# Patient Record
Sex: Female | Born: 1965
Health system: Southern US, Community
[De-identification: ages and names within clinical notes are randomized; demographics above are authoritative.]

## PROBLEM LIST (undated history)

## (undated) DIAGNOSIS — M519 Unspecified thoracic, thoracolumbar and lumbosacral intervertebral disc disorder: Secondary | ICD-10-CM

## (undated) DIAGNOSIS — I10 Essential (primary) hypertension: Secondary | ICD-10-CM

## (undated) DIAGNOSIS — E785 Hyperlipidemia, unspecified: Secondary | ICD-10-CM

## (undated) DIAGNOSIS — E669 Obesity, unspecified: Secondary | ICD-10-CM

## (undated) HISTORY — PX: REDUCTION MAMMAPLASTY: SUR839

## (undated) HISTORY — DX: Hyperlipidemia, unspecified: E78.5

## (undated) HISTORY — PX: CHOLECYSTECTOMY: SHX55

## (undated) HISTORY — DX: Obesity, unspecified: E66.9

## (undated) HISTORY — DX: Unspecified thoracic, thoracolumbar and lumbosacral intervertebral disc disorder: M51.9

## (undated) HISTORY — DX: Essential (primary) hypertension: I10

## (undated) HISTORY — PX: OTHER SURGICAL HISTORY: SHX169

---

## 1998-04-27 ENCOUNTER — Ambulatory Visit (HOSPITAL_COMMUNITY): Admission: RE | Admit: 1998-04-27 | Discharge: 1998-04-27 | Payer: Self-pay

## 1998-12-07 ENCOUNTER — Ambulatory Visit (HOSPITAL_COMMUNITY): Admission: RE | Admit: 1998-12-07 | Discharge: 1998-12-07 | Payer: Self-pay

## 2000-10-06 ENCOUNTER — Encounter: Payer: Self-pay | Admitting: Emergency Medicine

## 2000-10-06 ENCOUNTER — Emergency Department (HOSPITAL_COMMUNITY): Admission: EM | Admit: 2000-10-06 | Discharge: 2000-10-06 | Payer: Self-pay | Admitting: Emergency Medicine

## 2001-02-27 ENCOUNTER — Ambulatory Visit (HOSPITAL_COMMUNITY): Admission: RE | Admit: 2001-02-27 | Discharge: 2001-02-27 | Payer: Self-pay | Admitting: Family Medicine

## 2001-03-01 ENCOUNTER — Encounter: Admission: RE | Admit: 2001-03-01 | Discharge: 2001-03-01 | Payer: Self-pay | Admitting: Family Medicine

## 2001-03-01 ENCOUNTER — Encounter: Payer: Self-pay | Admitting: Family Medicine

## 2001-06-09 ENCOUNTER — Encounter: Payer: Self-pay | Admitting: Family Medicine

## 2001-06-09 ENCOUNTER — Encounter: Admission: RE | Admit: 2001-06-09 | Discharge: 2001-06-09 | Payer: Self-pay | Admitting: Family Medicine

## 2001-06-23 ENCOUNTER — Encounter: Admission: RE | Admit: 2001-06-23 | Discharge: 2001-06-23 | Payer: Self-pay | Admitting: Family Medicine

## 2001-06-23 ENCOUNTER — Encounter: Payer: Self-pay | Admitting: Family Medicine

## 2001-07-07 ENCOUNTER — Encounter: Payer: Self-pay | Admitting: Family Medicine

## 2001-07-07 ENCOUNTER — Encounter: Admission: RE | Admit: 2001-07-07 | Discharge: 2001-07-07 | Payer: Self-pay | Admitting: Family Medicine

## 2005-10-15 ENCOUNTER — Encounter: Admission: RE | Admit: 2005-10-15 | Discharge: 2005-10-15 | Payer: Self-pay | Admitting: Occupational Medicine

## 2008-01-22 ENCOUNTER — Emergency Department (HOSPITAL_COMMUNITY): Admission: EM | Admit: 2008-01-22 | Discharge: 2008-01-22 | Payer: Self-pay | Admitting: Family Medicine

## 2008-06-27 ENCOUNTER — Emergency Department (HOSPITAL_COMMUNITY): Admission: EM | Admit: 2008-06-27 | Discharge: 2008-06-27 | Payer: Self-pay | Admitting: Emergency Medicine

## 2008-06-30 ENCOUNTER — Emergency Department (HOSPITAL_COMMUNITY): Admission: EM | Admit: 2008-06-30 | Discharge: 2008-06-30 | Payer: Self-pay | Admitting: Emergency Medicine

## 2010-08-15 ENCOUNTER — Emergency Department (HOSPITAL_COMMUNITY)
Admission: EM | Admit: 2010-08-15 | Discharge: 2010-08-15 | Payer: Self-pay | Source: Home / Self Care | Admitting: Emergency Medicine

## 2010-08-17 ENCOUNTER — Emergency Department (HOSPITAL_COMMUNITY)
Admission: EM | Admit: 2010-08-17 | Discharge: 2010-08-17 | Payer: Self-pay | Source: Home / Self Care | Admitting: Family Medicine

## 2010-08-22 ENCOUNTER — Other Ambulatory Visit: Payer: Self-pay | Admitting: Internal Medicine

## 2010-08-22 ENCOUNTER — Ambulatory Visit
Admission: RE | Admit: 2010-08-22 | Discharge: 2010-08-22 | Payer: Self-pay | Source: Home / Self Care | Attending: Internal Medicine | Admitting: Internal Medicine

## 2010-08-22 DIAGNOSIS — E1149 Type 2 diabetes mellitus with other diabetic neurological complication: Secondary | ICD-10-CM | POA: Insufficient documentation

## 2010-08-22 DIAGNOSIS — M5137 Other intervertebral disc degeneration, lumbosacral region: Secondary | ICD-10-CM | POA: Insufficient documentation

## 2010-08-22 DIAGNOSIS — F329 Major depressive disorder, single episode, unspecified: Secondary | ICD-10-CM | POA: Insufficient documentation

## 2010-08-22 DIAGNOSIS — E118 Type 2 diabetes mellitus with unspecified complications: Secondary | ICD-10-CM | POA: Insufficient documentation

## 2010-08-22 DIAGNOSIS — E1165 Type 2 diabetes mellitus with hyperglycemia: Secondary | ICD-10-CM

## 2010-08-22 DIAGNOSIS — IMO0002 Reserved for concepts with insufficient information to code with codable children: Secondary | ICD-10-CM | POA: Insufficient documentation

## 2010-08-22 DIAGNOSIS — E785 Hyperlipidemia, unspecified: Secondary | ICD-10-CM

## 2010-08-22 DIAGNOSIS — K219 Gastro-esophageal reflux disease without esophagitis: Secondary | ICD-10-CM | POA: Insufficient documentation

## 2010-08-22 DIAGNOSIS — I10 Essential (primary) hypertension: Secondary | ICD-10-CM | POA: Insufficient documentation

## 2010-08-22 DIAGNOSIS — F32A Depression, unspecified: Secondary | ICD-10-CM | POA: Insufficient documentation

## 2010-08-22 DIAGNOSIS — E1169 Type 2 diabetes mellitus with other specified complication: Secondary | ICD-10-CM | POA: Insufficient documentation

## 2010-08-22 LAB — LIPID PANEL
Cholesterol: 247 mg/dL — ABNORMAL HIGH (ref 0–200)
HDL: 47 mg/dL (ref >39.00)
Total CHOL/HDL Ratio: 5
Triglycerides: 488 mg/dL — ABNORMAL HIGH (ref 0.0–149.0)
VLDL: 97.6 mg/dL — ABNORMAL HIGH (ref 0.0–40.0)

## 2010-08-22 LAB — HEMOGLOBIN A1C: Hgb A1c MFr Bld: 12 % — ABNORMAL HIGH (ref 4.6–6.5)

## 2010-08-22 LAB — LDL CHOLESTEROL, DIRECT: Direct LDL: 133.9 mg/dL

## 2010-09-07 ENCOUNTER — Telehealth: Payer: Self-pay | Admitting: Internal Medicine

## 2010-09-19 ENCOUNTER — Other Ambulatory Visit (HOSPITAL_COMMUNITY)
Admission: RE | Admit: 2010-09-19 | Discharge: 2010-09-19 | Disposition: A | Discharge status: Home / Self Care | Payer: 59 | Source: Ambulatory Visit | Attending: Internal Medicine | Admitting: Internal Medicine

## 2010-09-19 ENCOUNTER — Other Ambulatory Visit: Payer: Self-pay | Admitting: Internal Medicine

## 2010-09-19 ENCOUNTER — Ambulatory Visit
Admission: RE | Admit: 2010-09-19 | Discharge: 2010-09-19 | Payer: Self-pay | Source: Home / Self Care | Attending: Internal Medicine | Admitting: Internal Medicine

## 2010-09-19 DIAGNOSIS — Z01419 Encounter for gynecological examination (general) (routine) without abnormal findings: Secondary | ICD-10-CM | POA: Insufficient documentation

## 2010-09-21 NOTE — Assessment & Plan Note (Signed)
Summary: New / [employer] [facility] / #/cd   Vital Signs:  Patient profile:   45 year old female Height:      66 inches Weight:      242 pounds BMI:     39.20 O2 Sat:      99 % on Room air Temp:     98.7 degrees F oral Pulse rate:   106 / minute BP sitting:   128 / 90  (left arm) Cuff size:   large  Vitals Entered By: Donna [name] CMA (August 22, 2010 3:04 PM)  O2 Flow:  Room air CC: New pt here to est care with primary doctor/ ab   Primary Care Provider:  Illene [name]  CC:  New pt here to est care with primary doctor/ ab.  History of Present Illness: Patient presents to establish for continuity primary care.  She has been in denial for some time about her  blood pressure and diabetes.She finally, due to feeling bad, went to urgent care where she was markedly hypertensive and hyperglycemic. She was started on lisinopril 20 and metformin 500 two times a day and glipizide 5mg  once daily. She is feeling better.  In regards to health maintenance she reports that she hasn't had a pelivc and pap smear in the last 15 years. She does not do breast self exam. She has not had routine labs, e.g. lipid panel.  She is committed to taking better care of herself and working on long term health.  Current Medications (verified): 1)  Lisinopril 20 Mg Tabs (Lisinopril) .Marland Kitchen.. 1 Tablet Once Daily 2)  Chlorthalidone 25 Mg Tabs (Chlorthalidone) .Marland Kitchen.. 1 Tablet Daily 3)  Ra Acid Reducer Max St 150 Mg Tabs (Ranitidine Hcl) .Marland Kitchen.. 1 Tablet As Needed 4)  Glipizide 5 Mg Tabs (Glipizide) .Marland Kitchen.. 1 Tablet Daily 5)  Metformin Hcl 500 Mg Tabs (Metformin Hcl) .Marland Kitchen.. 1 Two Times A Day 6)  Zoloft 100 Mg Tabs (Sertraline Hcl) .Marland Kitchen.. 1 Tablet Once Daily  Allergies (verified): No Known Drug Allergies  Past History:  Past Medical History: fully immunized as a child UNSPECIFIED ESSENTIAL HYPERTENSION (ICD-401.9) DIABETES MELLITUS, TYPE II, UNCONTROLLED (ICD-250.02) DISC DISEASE, LUMBAR (ICD-722.52)  Past Surgical  History: reduction mammoplasty bilaterally (*Holderness) Lap cholecystectomy '96 (Dr. Ezzard Standing)  ESI-lumbar spine  G0P0  Family History: Father - '41: CHF, CAD/CABG, a. fib, Renal cell carcinoma, HTN, DM Mother - '20: DM, CAD/MI-PCA Neg - colon cancer; CVA Paternal Aunt - breast cancer  Social History: [employer] single, Lives in own home- mother lives with her.  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    Physical Exam  General:  Overweight white woman in no distress Head:  normocephalic and atraumatic.   Eyes:  vision grossly intact, pupils equal, and pupils round.  C&S clear Ears:  External ear exam shows no significant lesions or deformities.  Otoscopic examination reveals clear canals, tympanic membranes are intact bilaterally without bulging, retraction, inflammation or discharge. Hearing is grossly normal bilaterally. Nose:  no external deformity and no external erythema.   Mouth:  Oral mucosa and oropharynx without lesions or exudates.  Teeth in good repair. Neck:  supple, full ROM, and no thyromegaly.  Lungs:  normal breath sounds.   Heart:  normal rate and regular rhythm.   Pulses:  2+ radial pulse Neurologic:  alert & oriented X3 and gait normal.   Skin:  turgor normal and color normal.   Psych:  Oriented X3, memory intact for recent and remote, normally interactive, good eye contact, and not anxious appearing.     Impression & Recommendations:  Problem # 1:  DIABETES MELLITUS, TYPE II, UNCONTROLLED (ICD-250.02) Newly diagnosed  diabetic now on sulfonylurea and biguaanide with good results. She has not had an A1C.  Plan - continue present medication            A1C           life-style management: stressed the primary importance of good diet and regular exercise to control diabetes  Her updated medication list for this problem includes:    Lisinopril 20 Mg Tabs (Lisinopril) .Marland Kitchen... 1 tablet once daily    Glipizide 5 Mg Tabs (Glipizide) .Marland Kitchen... 1 tablet daily    Metformin Hcl 500 Mg Tabs (Metformin hcl) .Marland Kitchen... 1 two times a day  Orders: TLB-A1C / Hgb A1C (Glycohemoglobin) (83036-A1C) TLB-Lipid Panel (80061-LIPID)  Addendum: A1C 12%  Problem # 2:  UNSPECIFIED ESSENTIAL HYPERTENSION (ICD-401.9)  Her updated medication list for this problem includes:    Lisinopril 20 Mg Tabs (Lisinopril) .Marland Kitchen... 1 tablet once daily    Chlorthalidone 25 Mg Tabs (Chlorthalidone) .Marland Kitchen... 1 tablet daily  BP today: 128/90  Adequate control on present medications.  Plan - continue present medications           metabolic panel: r/o adverse side affects of treatment  Problem # 3:  HYPERLIPIDEMIA (ICD-272.4)  Based on family history and habitus she is at high risk for hyperlipidemia. Based on NCEP and ADA guidelines her ideal LDL cholesterol shoule 100 or less. Furthermore, emerging data favors the use of "statin" drugs for cardiovascular risk reduction in diabetic regardless of their baseline LDL.  Plan - lipid profile with recommendations to follow.  Addendum - LDL 130+  Plan - start on lovastatin 40mg  qPM           f/u lipid panel in 3-4 weeks.   Her updated medication list for this problem includes:    Lovastatin 40 Mg Tabs (Lovastatin) .Marland Kitchen... 1 by mouth qpm for control of cholesterol  Problem # 4:  DEPRESSION, CHRONIC (ICD-311) Donna [name] reports that she started SSRI therapy around the time she finished college. Over the years she has had drug holidays and finds that she just doesn't do as well when off medication suggesting a  neurochemical imbalance that may be chronic.  Plan - contnue present medication.  Her updated medication list for this problem includes:    Zoloft 100 Mg Tabs (Sertraline hcl) .Marland Kitchen... 1 tablet once daily  Problem # 5:  DYSPEPSIA (ICD-536.8) She reports intermittent dypsepsia for which she will take otc H2 blocker theray with good results.  Problem # 6:  OBESITY, CLASS III (ICD-278.01) Discussed with Ms. Kohlbeck the fact that obesity is the number 1 primary risk factor in regard to her health and that her other medical problems are directly tied to this problem. Discussed weight management: smart food choices and elimination of empty calories; PORTION SIZE CONTROL - use of hand as guide, meal of a 1000 chews to release frontal lobe satiety transmitters; regular exercise - at least 15 minutes a day of aerobic exercise and up to 30 minutes. Target weight 180 lbs; goal - to  loose 1-2 lbs a month ( 5 year project! - with significant benefits all along the way.) Framed this concept in terms of good performance mgt: give people attainable goals and they will continue to achieve!  Problem # 7:  Preventive Health Care (ICD-V70.0) History as above with several active medical problems being brought under control. Limited physical exam unremarkable except for size.   Plan - return OV in 3-4 weeks for complete well woman physical exam with pelvic/PAP, breast exam and more thorough physical exam.  Complete Medication List: 1)  Lisinopril 20 Mg Tabs (Lisinopril) .Marland Kitchen.. 1 tablet once daily 2)  Chlorthalidone 25 Mg Tabs (Chlorthalidone) .Marland Kitchen.. 1 tablet daily 3)  Ra Acid Reducer Max St 150 Mg Tabs (Ranitidine hcl) .Marland Kitchen.. 1 tablet as needed 4)  Glipizide 5 Mg Tabs (Glipizide) .Marland Kitchen.. 1 tablet daily 5)  Metformin Hcl 500 Mg Tabs (Metformin hcl) .Marland Kitchen.. 1 two times a day 6)  Zoloft 100 Mg Tabs (Sertraline hcl) .Marland Kitchen.. 1 tablet once daily 7)  Lovastatin 40 Mg Tabs (Lovastatin) .Marland Kitchen.. 1 by mouth qpm for control of  cholesterol   Patient: Donna [name] Note: All result statuses are Final unless otherwise noted.  Tests: (1) Hemoglobin A1C (A1C)   Hemoglobin A1C       [H]  12.0 %                      4.6-6.5     Glycemic Control Guidelines for People with Diabetes:     Non Diabetic:  <6%     Goal of Therapy: <7%     Additional Action Suggested:  >8%   Tests: (2) Lipid Panel (LIPID)   Cholesterol          [H]  247 mg/dL                   1-478     ATP III Classification            Desirable:  < 200 mg/dL                    Borderline High:  200 - 239 mg/dL               High:  > = 240 mg/dL   Triglycerides        [H]                              0.0-149.0       RESULT: 488.0 Triglyceride is over 400; calculations on Lipids are invalid. mg/dL     Normal:  <295 mg/dL     Borderline High:  621 - 199 mg/dL   HDL                       30.86 mg/dL                 >57.84   VLDL Cholesterol     [H]  97.6 mg/dL                  6.9-62.9  CHO/HDL Ratio:  CHD Risk                             5                    Men  Women     1/2 Average Risk     3.4          3.3     Average Risk          5.0          4.4     2X Average Risk          9.6          7.1     3X Average Risk          15.0          11.0                           Tests: (3) Cholesterol LDL - Direct (DIRLDL)  Cholesterol LDL - Direct                             133.9 mg/dL     Optimal:  <188 mg/dL     Near or Above Optimal:  100-129 mg/dL     Borderline High:  416-606 mg/dL     High:  301-601 mg/dL     Very High:  >093 mg/dLPrescriptions: LOVASTATIN 40 MG TABS (LOVASTATIN) 1 by mouth qPM for control of cholesterol  #30 x 12   Entered and Authorized by:   Jacques Navy MD   Signed by:   Jacques Navy MD on 08/23/2010   Method used:   Electronically to        [facility]* (retail)       [address]       [address] Shipping/mailing       [place], [place]  [address]       Ph: [phone]        Fax: [phone]   RxID:   [id number]    Orders Added: 1)  New Patient Level IV [99204] 2)  TLB-A1C / Hgb A1C (Glycohemoglobin) [83036-A1C] 3)  TLB-Lipid Panel [80061-LIPID]

## 2010-09-21 NOTE — Progress Notes (Signed)
  Phone Note Refill Request Message from:  Fax from Pharmacy on September 07, 2010 5:36 PM     Prescriptions: METFORMIN HCL 500 MG TABS (METFORMIN HCL) 1 two times a day  #90 x 3   Entered by:   Ami Bullins CMA   Authorized by:   Jacques Navy MD   Signed by:   Bill Salinas CMA on 09/07/2010   Method used:   Electronically to        [facility]* (retail)       [address]       [address] Shipping/mailing       [place], [place]  [address]       Ph: [phone]       Fax: [phone]   RxID:   [id number] GLIPIZIDE 5 MG TABS (GLIPIZIDE) 1 tablet daily  #90 x 3   Entered by:   Bill Salinas CMA   Authorized by:   Jacques Navy MD   Signed by:   Bill Salinas CMA on 09/07/2010   Method used:   Electronically to        [facility]* (retail)       [address]       [address] Shipping/mailing       [place], [place]  [address]       Ph: [phone]       Fax: [phone]   RxID:   [id number] CHLORTHALIDONE 25 MG TABS (CHLORTHALIDONE) 1 tablet daily  #90 x 3   Entered by:   Bill Salinas CMA   Authorized by:   Jacques Navy MD   Signed by:   Bill Salinas CMA on 09/07/2010   Method used:   Electronically to        [facility]* (retail)       [address]       [address] Shipping/mailing       [place], [place]  [address]       Ph: [phone]       Fax: [phone]   RxID:   [id number] LISINOPRIL 20 MG TABS (LISINOPRIL) 1 tablet once daily  #90 x 3   Entered by:   Bill Salinas CMA   Authorized by:   Jacques Navy MD   Signed by:   Bill Salinas CMA on 09/07/2010   Method used:   Electronically to        [facility]* (retail)       [address]       [address] Shipping/mailing       [place], [place]  [address]       Ph: [phone]       Fax: [phone]   RxID:   [id number]

## 2010-09-24 ENCOUNTER — Encounter: Payer: Self-pay | Admitting: Internal Medicine

## 2010-09-27 NOTE — Assessment & Plan Note (Signed)
Summary: f/u extended ov/#/cd   Vital Signs:  Patient profile:   45 year old female Height:      66 inches Weight:      237 pounds BMI:     38.39 O2 Sat:      97 % on Room air Temp:     98.5 degrees F oral Pulse rate:   98 / minute BP sitting:   114 / 82  (left arm) Cuff size:   large  Vitals Entered By: Bill Salinas CMA (September 19, 2010 3:53 PM)  O2 Flow:  Room air CC: full well women exam/ ab   Primary Care Provider:  Illene Regulus  CC:  full well women exam/ ab.  History of Present Illness: Ms. Hammac was recently seen as a new patient. She has reveiwed the original note and has no corrections, additions or deletions. She reorts that she already feels better since starting on treatment for her diabetes.   She presents today for well woman examination: breast exam and pelvic w/ PAP  Current Medications (verified): 1)  Lisinopril 20 Mg Tabs (Lisinopril) .Marland Kitchen.. 1 Tablet Once Daily 2)  Chlorthalidone 25 Mg Tabs (Chlorthalidone) .Marland Kitchen.. 1 Tablet Daily 3)  Ra Acid Reducer Max St 150 Mg Tabs (Ranitidine Hcl) .Marland Kitchen.. 1 Tablet As Needed 4)  Glipizide 5 Mg Tabs (Glipizide) .Marland Kitchen.. 1 Tablet Daily 5)  Metformin Hcl 500 Mg Tabs (Metformin Hcl) .Marland Kitchen.. 1 Two Times A Day 6)  Zoloft 100 Mg Tabs (Sertraline Hcl) .Marland Kitchen.. 1 Tablet Once Daily 7)  Lovastatin 40 Mg Tabs (Lovastatin) .Marland Kitchen.. 1 By Mouth Qpm For Control of Cholesterol  Allergies (verified): No Known Drug Allergies PMH-FH-SH reviewed-no changes except otherwise noted  Review of Systems       The patient complains of weight loss.  The patient denies anorexia, fever, weight gain, vision loss, chest pain, dyspnea on exertion, prolonged cough, abdominal pain, severe indigestion/heartburn, muscle weakness, and abnormal bleeding.    Physical Exam  General:  overweight white woman in no distress Head:  normocephalic and atraumatic.   Breasts:  S/P reduction mammoplasty with scarring at the underside of both breasts. Thickened brest tissue at the  inferior aspect of both breasts. No nipple discharge, no other skin changes, moderate fibrocystic changes in both breasts. No fixed mass or suspicious lesion. Lungs:  normal respiratory effort.   Heart:  normal rate and regular rhythm.   Genitalia:  Pelvic Exam:        External: normal female genitalia without lesions or masses        Vagina: normal without lesions or masses        Cervix: normal without lesions or masses        Adnexa: normal bimanual exam without masses or fullness        Uterus: normal by palpation        Pap smear: performed   Impression & Recommendations:  Problem # 1:  HEALTH MAINTENANCE EXAM (ICD-V70.0) limited wellness exam: pelvic and pap, breast exam.  Problem # 2:  HYPERLIPIDEMIA (ICD-272.4) LDL 133+ - too high for a diabetic and she has been started on lovastatin 40mg  once daily.   Plan recheck lipids in 3 months.  Her updated medication list for this problem includes:    Lovastatin 40 Mg Tabs (Lovastatin) .Marland Kitchen... 1 by mouth qpm for control of cholesterol  Problem # 3:  DIABETES MELLITUS, TYPE II, UNCONTROLLED (ICD-250.02) A1c very high. Pt now on two drug therapy Plan - follow-up lab in 3  months.   Her updated medication list for this problem includes:    Lisinopril 20 Mg Tabs (Lisinopril) .Marland Kitchen... 1 tablet once daily    Glipizide 5 Mg Tabs (Glipizide) .Marland Kitchen... 1 tablet daily    Metformin Hcl 500 Mg Tabs (Metformin hcl) .Marland Kitchen... 1 two times a day  Complete Medication List: 1)  Lisinopril 20 Mg Tabs (Lisinopril) .Marland Kitchen.. 1 tablet once daily 2)  Chlorthalidone 25 Mg Tabs (Chlorthalidone) .Marland Kitchen.. 1 tablet daily 3)  Ra Acid Reducer Max St 150 Mg Tabs (Ranitidine hcl) .Marland Kitchen.. 1 tablet as needed 4)  Glipizide 5 Mg Tabs (Glipizide) .Marland Kitchen.. 1 tablet daily 5)  Metformin Hcl 500 Mg Tabs (Metformin hcl) .Marland Kitchen.. 1 two times a day 6)  Zoloft 100 Mg Tabs (Sertraline hcl) .Marland Kitchen.. 1 tablet once daily 7)  Lovastatin 40 Mg Tabs (Lovastatin) .Marland Kitchen.. 1 by mouth qpm for control of  cholesterol   Orders Added: 1)  Est. Patient 40-64 years [phone]

## 2010-10-05 NOTE — Letter (Signed)
    [facility] [address] [place], [place]  [address] Phone: [phone]      September 25, 2010   ALANNY RIVERS [address] [place], [place] [address]  RE:  LAB RESULTS  Dear  Ms. Frese,  The following is an interpretation of your most recent lab tests.  Please take note of any instructions provided or changes to medications that have resulted from your lab work.  Pap Smear: normal     Sincerely Yours,    Jacques Navy MD

## 2010-10-30 LAB — POCT I-STAT, CHEM 8
Glucose, Bld: 251 mg/dL — ABNORMAL HIGH (ref 70–99)
HCT: 49 % — ABNORMAL HIGH (ref 36.0–46.0)
Hemoglobin: 16.7 g/dL — ABNORMAL HIGH (ref 12.0–15.0)
Potassium: 4.5 mEq/L (ref 3.5–5.1)
Sodium: 134 mEq/L — ABNORMAL LOW (ref 135–145)
TCO2: 26 mmol/L (ref 0–100)

## 2010-12-14 ENCOUNTER — Telehealth: Payer: Self-pay | Admitting: *Deleted

## 2010-12-14 ENCOUNTER — Ambulatory Visit (INDEPENDENT_AMBULATORY_CARE_PROVIDER_SITE_OTHER): Payer: 59 | Admitting: Internal Medicine

## 2010-12-14 ENCOUNTER — Encounter: Payer: Self-pay | Admitting: Internal Medicine

## 2010-12-14 VITALS — BP 132/88 | HR 96 | Temp 97.8°F | Resp 16 | Wt 237.5 lb

## 2010-12-14 DIAGNOSIS — H109 Unspecified conjunctivitis: Secondary | ICD-10-CM

## 2010-12-14 MED ORDER — GENTAMICIN SULFATE 0.3 % OP SOLN: 1.0000 [drp] | OPHTHALMIC | Status: AC

## 2010-12-14 NOTE — Telephone Encounter (Signed)
Patient requesting rx if possible for eye infection. She is c/o redness, drainage and pain in her left eye. Would MD call in RX or does she need OV?

## 2010-12-14 NOTE — Telephone Encounter (Signed)
Seen today - viral conjunctivitis

## 2010-12-15 NOTE — Progress Notes (Signed)
  Subjective:    Patient ID: Donna [name], female    DOB: 08-13-1966, 45 y.o.   MRN: [id number]  HPI Ms. Senat is seen acutely for c/o irritation, erythema and drainage from the left eye. She reports on-set in the last 12 hours. No ocular pain, no change in vision. Drainage has been sero-sanguinous without matting. No facial pain.  PMH, FamHx and SocHx reviewed for any changes and relevance.     Review of Systems No fevers/sweats/chills. NO nodes. Respiratory and cardiac review negative    Objective:   Physical Exam Overweight white woman in no acute distress Opthal - left bulbar conjunctiva injected. Iris and lens normal and clear. Lids and glands normal. Pupil round, reactive.       Assessment & Plan:  1. Conjunctivitis - presentation c/w  Viral conjunctivitis.  Plan - warm compresses, APAP for pain           For change in d/c to purulent +/- matting - fill Rx for gentamicin qtts           For pain or change in vision - go directly to opthalmology

## 2010-12-17 ENCOUNTER — Inpatient Hospital Stay (INDEPENDENT_AMBULATORY_CARE_PROVIDER_SITE_OTHER)
Admission: RE | Admit: 2010-12-17 | Discharge: 2010-12-17 | Disposition: A | Discharge status: Home / Self Care | Payer: 59 | Source: Ambulatory Visit | Attending: Family Medicine | Admitting: Family Medicine

## 2010-12-17 DIAGNOSIS — N390 Urinary tract infection, site not specified: Secondary | ICD-10-CM

## 2010-12-17 LAB — POCT URINALYSIS DIP (DEVICE)
Glucose, UA: 500 mg/dL — AB
Nitrite: POSITIVE — AB
Protein, ur: >=300 mg/dL — AB
Urobilinogen, UA: 1 mg/dL (ref 0.0–1.0)

## 2010-12-19 ENCOUNTER — Other Ambulatory Visit (INDEPENDENT_AMBULATORY_CARE_PROVIDER_SITE_OTHER): Payer: 59 | Admitting: Internal Medicine

## 2010-12-19 ENCOUNTER — Other Ambulatory Visit: Payer: Self-pay | Admitting: Internal Medicine

## 2010-12-19 ENCOUNTER — Other Ambulatory Visit (INDEPENDENT_AMBULATORY_CARE_PROVIDER_SITE_OTHER): Payer: 59

## 2010-12-19 DIAGNOSIS — E785 Hyperlipidemia, unspecified: Secondary | ICD-10-CM

## 2010-12-19 DIAGNOSIS — T887XXA Unspecified adverse effect of drug or medicament, initial encounter: Secondary | ICD-10-CM

## 2010-12-19 DIAGNOSIS — IMO0001 Reserved for inherently not codable concepts without codable children: Secondary | ICD-10-CM

## 2010-12-19 DIAGNOSIS — Z13220 Encounter for screening for lipoid disorders: Secondary | ICD-10-CM

## 2010-12-19 LAB — LIPID PANEL
Total CHOL/HDL Ratio: 4
Triglycerides: 308 mg/dL — ABNORMAL HIGH (ref 0.0–149.0)

## 2010-12-19 LAB — HEPATIC FUNCTION PANEL
ALT: 22 U/L (ref 0–35)
AST: 21 U/L (ref 0–37)
Alkaline Phosphatase: 55 U/L (ref 39–117)
Bilirubin, Direct: 0.1 mg/dL (ref 0.0–0.3)
Total Bilirubin: 0.6 mg/dL (ref 0.3–1.2)

## 2010-12-25 ENCOUNTER — Encounter: Payer: Self-pay | Admitting: Internal Medicine

## 2010-12-25 ENCOUNTER — Telehealth: Payer: Self-pay | Admitting: Internal Medicine

## 2010-12-25 NOTE — Telephone Encounter (Signed)
Please call patient: liid panel with total of 197, HDL 48.9 = good, LDL 117.1 = good. Triglycerides 380 - too high and very much diet related - no need for additional medication.

## 2010-12-26 ENCOUNTER — Ambulatory Visit: Payer: Self-pay | Admitting: Internal Medicine

## 2010-12-29 NOTE — Telephone Encounter (Signed)
Pt informed

## 2011-04-26 ENCOUNTER — Other Ambulatory Visit: Payer: Self-pay | Admitting: Internal Medicine

## 2011-05-22 LAB — POCT URINALYSIS DIP (DEVICE)
Bilirubin Urine: NEGATIVE
Glucose, UA: 500 mg/dL — AB
Hgb urine dipstick: NEGATIVE
Ketones, ur: 15 mg/dL — AB
Specific Gravity, Urine: 1.02 (ref 1.005–1.030)
pH: 7 (ref 5.0–8.0)

## 2011-05-22 LAB — GLUCOSE, CAPILLARY

## 2011-07-17 ENCOUNTER — Other Ambulatory Visit: Payer: Self-pay | Admitting: *Deleted

## 2011-07-17 MED ORDER — SERTRALINE HCL 100 MG PO TABS: 100.0000 mg | ORAL_TABLET | Freq: Every day | ORAL | Status: DC

## 2011-10-10 ENCOUNTER — Other Ambulatory Visit (INDEPENDENT_AMBULATORY_CARE_PROVIDER_SITE_OTHER): Payer: 59

## 2011-10-10 ENCOUNTER — Encounter: Payer: Self-pay | Admitting: Internal Medicine

## 2011-10-10 ENCOUNTER — Ambulatory Visit (INDEPENDENT_AMBULATORY_CARE_PROVIDER_SITE_OTHER): Payer: 59 | Admitting: Internal Medicine

## 2011-10-10 DIAGNOSIS — E785 Hyperlipidemia, unspecified: Secondary | ICD-10-CM

## 2011-10-10 DIAGNOSIS — I10 Essential (primary) hypertension: Secondary | ICD-10-CM

## 2011-10-10 DIAGNOSIS — IMO0001 Reserved for inherently not codable concepts without codable children: Secondary | ICD-10-CM

## 2011-10-10 LAB — HEPATIC FUNCTION PANEL
AST: 18 U/L (ref 0–37)
Albumin: 4.1 g/dL (ref 3.5–5.2)
Alkaline Phosphatase: 51 U/L (ref 39–117)
Bilirubin, Direct: 0.1 mg/dL (ref 0.0–0.3)
Total Protein: 7.4 g/dL (ref 6.0–8.3)

## 2011-10-10 LAB — COMPREHENSIVE METABOLIC PANEL
ALT: 21 U/L (ref 0–35)
AST: 18 U/L (ref 0–37)
BUN: 13 mg/dL (ref 6–23)
Calcium: 9 mg/dL (ref 8.4–10.5)
Chloride: 93 mEq/L — ABNORMAL LOW (ref 96–112)
Creatinine, Ser: 0.6 mg/dL (ref 0.4–1.2)
GFR: 123.71 mL/min (ref >60.00)
Total Bilirubin: 0.7 mg/dL (ref 0.3–1.2)

## 2011-10-10 LAB — LIPID PANEL
Cholesterol: 261 mg/dL — ABNORMAL HIGH (ref 0–200)
Total CHOL/HDL Ratio: 5
Triglycerides: 458 mg/dL — ABNORMAL HIGH (ref 0.0–149.0)
VLDL: 91.6 mg/dL — ABNORMAL HIGH (ref 0.0–40.0)

## 2011-10-10 MED ORDER — METFORMIN HCL ER (MOD) 1000 MG PO TB24: 1000.0000 mg | ORAL_TABLET | Freq: Every day | ORAL | Status: DC

## 2011-10-11 ENCOUNTER — Telehealth: Payer: Self-pay | Admitting: *Deleted

## 2011-10-11 ENCOUNTER — Encounter: Payer: Self-pay | Admitting: Internal Medicine

## 2011-10-11 NOTE — Telephone Encounter (Signed)
Received call from pt requesting lab results. Notified pt results per lab letter and advised her she should be receiving letter soon.

## 2011-10-14 ENCOUNTER — Encounter: Payer: Self-pay | Admitting: Internal Medicine

## 2011-10-14 NOTE — Assessment & Plan Note (Signed)
Out of Control!!  Plan - no change in medications but change is dosage form so that she can take metformin once a day. She will continue on oral sulfonylurea           F/u A1C in 3 months - if not improved will need to consider basal insulin therapy.          Recommended she consider bariatric surgery.

## 2011-10-14 NOTE — Assessment & Plan Note (Signed)
LDL is not a goal of 100 or less on lovastatin.  Plan - better medication adherence            Repeat lab in 3 months - if not better controlled will need to consider either simvastatin vs crestor

## 2011-10-14 NOTE — Assessment & Plan Note (Signed)
Reemphasize need for weight management.  Plan - smart food choices, PORTION SIZE CONTROL, exercise           She is advised to consider bariatric surgery

## 2011-10-14 NOTE — Progress Notes (Signed)
  Subjective:    Patient ID: Donna [name], female    DOB: [birth year], 46 y.o.   MRN: [id number]  HPI Donna [name] presents for follow up of diabetes and choletesterol. She is an Char[employer] fundraiser and she admits that she has been non-adherent to taking her medications as prescribed. She has also had a very difficult time with weight management. She has been feeling fatigued and very low on energy which she attributes to hyperglycemia. She denies pain, fever, dysuria, myalgias or other signs or symptoms of acute illness.  Past Medical History  Diagnosis Date  . Hypertension   . Diabetes mellitus     type 2- uncontrolled  . Lumbar disc disease    Past Surgical History  Procedure Date  . Reduction mammoplasty bilaterally   . Cholecystectomy '96    Lap  . Esi     lumbar spine   Family History  Problem Relation Age of Onset  . Other Father     CHF  . Coronary artery disease Father   . Atrial fibrillation Father   . Cancer Father     renal cell carcinoma  . Hypertension Father   . Diabetes Father   . Diabetes Mother   . Coronary artery disease Mother   . Heart attack Mother   . Cancer Paternal Aunt     Breast   History   Social History  . Marital Status: Single    Spouse Name: N/A    Number of Children: N/A  . Years of Education: 16   Occupational History  . NURSE    Social History Main Topics  . Smoking status: Never Smoker   . Smokeless tobacco: Never Used  . Alcohol Use: No  . Drug Use: No  . Sexually Active: Not Currently   Other Topics Concern  . Not on file   Social History Narrative   HSG, [employer]- [employer]. single, Lives in own home- mother lives with her. 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 Denies any h/o physical or sexual abuse       Review of Systems System review is negative for any constitutional, cardiac, pulmonary, GI or neuro symptoms or complaints other than as described in the HPI.     Objective:   Physical Exam Filed Vitals:   10/10/11 1050    BP: 140/74  Pulse: 97  Temp: 97.9 F (36.6 C)  Resp: 16  Weight: 233 lb 12 oz (106.028 kg)  There is no height on file to calculate BMI.  Gen'l - overweight white woman in no distress HEENT- C&S clear Pulm - normal respirations Cor - RRR  Lab Results  Component Value Date   HGB 16.7* 08/15/2010   HCT 49.0* 08/15/2010   GLUCOSE 238* 10/10/2011   CHOL 261* 10/10/2011   TRIG 458.0 10/10/2011   HDL 55.50 10/10/2011   LDLDIRECT 147.4 10/10/2011   ALT 21 10/10/2011   ALT 21 10/10/2011   AST 18 10/10/2011   AST 18 10/10/2011   NA 133* 10/10/2011   K 3.4* 10/10/2011   CL 93* 10/10/2011   CREATININE 0.6 10/10/2011   BUN 13 10/10/2011   CO2 28 10/10/2011   HGBA1C 13.2* 10/10/2011           Assessment & Plan:

## 2011-11-19 ENCOUNTER — Other Ambulatory Visit: Payer: Self-pay | Admitting: Internal Medicine

## 2011-11-19 NOTE — Telephone Encounter (Signed)
Done

## 2011-12-28 ENCOUNTER — Other Ambulatory Visit: Payer: Self-pay | Admitting: Internal Medicine

## 2012-01-15 ENCOUNTER — Telehealth: Payer: Self-pay | Admitting: Internal Medicine

## 2012-01-15 MED ORDER — INSULIN DETEMIR 100 UNIT/ML ~~LOC~~ SOLN: 25.0000 [IU] | Freq: Every day | SUBCUTANEOUS | Status: DC

## 2012-01-15 NOTE — Telephone Encounter (Signed)
Ok to change to Lantus, Psychiatric nurse - same instructions as levemir Rx.

## 2012-01-15 NOTE — Telephone Encounter (Signed)
Spoke with patient at hospital.  Plan - basal insulin therapy: levemir start at 25 units qHS. Keep CBG log of fasting readings: if 150+ 3 days in a row increase Levemir by 3 units. Continue titration schedule until controlled or reaches 60 units. Stop glipizide.

## 2012-01-15 NOTE — Telephone Encounter (Signed)
Caller: Donna [name]/Patient is calling with a question about Levamir.The medication was written by Illene Regulus.  Donna [name] calling stating Dr [name] sent in rx today, 01/15/2012,   to  [facility]   for [employer] but it is too expensive - Donna [name] stating she is apart of the [employer] program and Lantus is covered instead and wants to know if it can be changed. Donna [name] can be called back at [phone]

## 2012-01-15 NOTE — Telephone Encounter (Signed)
Caller: Twinkle/Patient; PCP: Illene Regulus; CB#: [phone]; Call regarding Blood Sugars; States she saw provider at hospital on 5/25 and she was to call and remind Dr. Arthur Holms that her medications need to be adjusted due to blood sugar out of control.  Most recent FSBG 436 before breakfast  01/14/12; not under 300 for at least 2 weeks.  Information noted and sent to provider for follow up per Diabetes:  Control Problems protocol.   [facility] is her choice.

## 2012-01-16 ENCOUNTER — Telehealth: Payer: Self-pay | Admitting: Internal Medicine

## 2012-01-16 NOTE — Telephone Encounter (Signed)
Caller: Madalene/Patient is calling with a question about Lantis Insulin.The medication was written by Illene Regulus. Reports called 01/15/12 to request Levemir Insulin be changed to Lantis Insulin d/t cost but has not heard back from office. Is part of [facility] program; Lantis is covered.  Wants RX sent to [facility].   Per Epic, advised MD received 01/15/12 request and approved change of insulin per "OK to change to Lantus Solastar pen- same instructions as Levemir RX." RX has not yet been sent to pharmacy.  States did not test FBS today; "MD knows its been running high that is why he added new medicine.".  Advised will send message to remind staff to submit new RX to pharmacy for unable to obtain RX medication r/t available resources and situation poses immediate clinical risk per Med Question Call Guideline.  Office: please follow up.

## 2012-01-17 ENCOUNTER — Telehealth: Payer: Self-pay | Admitting: Internal Medicine

## 2012-01-17 NOTE — Telephone Encounter (Signed)
Caller: Donna [name]/Patient is calling with a question about Lantus.The medication was written by Illene Regulus. [phone]. Pt's new rx for Lantus hasn't been faxed to her pharmacy. The rx was ok'd per Dr. [name] on 01/15/12 and another note was sent in on 01/16/12 to have the rx faxed in but the pharmacy still doesn't have it. Pls call.

## 2012-01-18 NOTE — Telephone Encounter (Signed)
i authorized the change to lantus - don't know why it was'nt called in. Please call in.

## 2012-01-23 MED ORDER — INSULIN GLARGINE 100 UNIT/ML ~~LOC~~ SOLN: 25.0000 [IU] | Freq: Every day | SUBCUTANEOUS | Status: DC

## 2012-01-23 NOTE — Telephone Encounter (Signed)
Please send in Rx and call pt.

## 2012-01-23 NOTE — Telephone Encounter (Signed)
Medication lantus called to Doctor, general practice. Left message on patient voice mail home #.

## 2012-01-25 ENCOUNTER — Other Ambulatory Visit: Payer: Self-pay | Admitting: *Deleted

## 2012-01-25 MED ORDER — GLUCOSE BLOOD VI STRP: ORAL_STRIP | Status: DC

## 2012-01-25 NOTE — Telephone Encounter (Signed)
Refill sent to [facility] for test strips

## 2012-02-13 ENCOUNTER — Encounter: Payer: Self-pay | Admitting: Dietician

## 2012-02-13 ENCOUNTER — Encounter: Payer: 59 | Attending: Internal Medicine | Admitting: Dietician

## 2012-02-13 NOTE — Progress Notes (Signed)
  Patient was seen on 02/13/2012 for the first of a series of three diabetes self-management courses at [facility]. The following learning objectives were met by the patient during this course:   Defines the role of glucose and insulin  Identifies type of diabetes and pathophysiology  Defines the diagnostic criteria for diabetes and prediabetes  States the risk factors for Type 2 Diabetes  States the symptoms of Type 2 Diabetes  Defines Type 2 Diabetes treatment goals  Defines Type 2 Diabetes treatment options  States the rationale for glucose monitoring  Identifies A1C, glucose targets, and testing times  Identifies proper sharps disposal  Defines the purpose of a diabetes food plan  Identifies carbohydrate food groups  Defines effects of carbohydrate foods on glucose levels  Identifies carbohydrate choices/grams/food labels  States benefits of physical activity and effect on glucose  Review of suggested activity guidelines  HgA1C:>14.0  Handouts given during class include:  Type 2 Diabetes: Basics Book  My Food Plan Book  Food and Activity Log  Patient has established the following initial goals:  Increase my exercise  Work on my stress levels  Monitor my blood glucose  Follow a diabetes meal plan  Lose weight  Follow-Up Plan: Attend the remaining Diabetes Self-Management Core Classes

## 2012-03-13 ENCOUNTER — Ambulatory Visit: Payer: 59

## 2012-03-27 ENCOUNTER — Ambulatory Visit: Payer: 59

## 2012-04-15 ENCOUNTER — Other Ambulatory Visit: Payer: Self-pay | Admitting: *Deleted

## 2012-04-15 MED ORDER — METFORMIN HCL ER (MOD) 1000 MG PO TB24: 1000.0000 mg | ORAL_TABLET | Freq: Two times a day (BID) | ORAL | Status: DC

## 2012-04-15 MED ORDER — INSULIN GLARGINE 100 UNIT/ML ~~LOC~~ SOLN: SUBCUTANEOUS | Status: DC

## 2012-04-22 ENCOUNTER — Encounter: Payer: 59 | Attending: Internal Medicine | Admitting: *Deleted

## 2012-04-23 ENCOUNTER — Encounter: Payer: Self-pay | Admitting: *Deleted

## 2012-04-23 NOTE — Progress Notes (Signed)
  Patient was seen on 04/22/12 for the second of a series of three diabetes self-management courses at the [facility]. The following learning objectives were met by the patient during this course:   Explain basic nutrition maintenance and quality assurance  Describe causes, symptoms and treatment of hypoglycemia and hyperglycemia  Explain how to manage diabetes during illness  Describe the importance of good nutrition for health and healthy eating strategies  List strategies to follow meal plan when dining out  Describe the effects of alcohol on glucose and how to use it safely  Describe problem solving skills for day-to-day glucose challenges  Describe strategies to use when treatment plan needs to change  Identify important factors involved in successful weight loss  Describe ways to remain physically active  Describe the impact of regular activity on insulin resistance   Handouts given in class:  Refrigerator magnet for Sick Day Guidelines  [facility] Oral medication/insulin handout  Follow-Up Plan: Patient will attend the final class of the ADA Diabetes Self-Care Education.   

## 2012-04-23 NOTE — Patient Instructions (Signed)
Goals:  Follow Diabetes Meal Plan as instructed  Eat 3 meals and 2 snacks, every 3-5 hrs  Limit carbohydrate intake to 30-45 grams carbohydrate/meal  Limit carbohydrate intake to 15 grams carbohydrate/snack  Add lean protein foods to meals/snacks  Monitor glucose levels as instructed by your doctor  Aim for 30 mins of physical activity daily  Bring food record and glucose log to your next nutrition visit 

## 2012-04-24 ENCOUNTER — Encounter: Payer: 59 | Admitting: *Deleted

## 2012-04-24 NOTE — Progress Notes (Signed)
  Patient was seen on 04/24/12 for the third of a series of three diabetes self-management courses at the [facility]. The following learning objectives were met by the patient during this course:    Describe how diabetes changes over time   Identify diabetes complications and ways to prevent them   Describe strategies that can promote heart health including lowering blood pressure and cholesterol   Describe strategies to lower dietary fat and sodium in the diet   Identify physical activities that benefit cardiovascular health   Evaluate success in meeting personal goal   Describe the belief that they can live successfully with diabetes day to day   Establish 2-3 goals that they will plan to diligently work on until they return for the free 17-month follow-up visit  The following handouts were given in class:  3 Month Follow Up Visit handout  Goal setting handout  Class evaluation form  Your patient has established the following 3 month goals for diabetes self-care:  Increase activity at least 2 days/week  Take diabetes medication as scheduled  Check glucose qd  Follow-Up Plan: Patient will attend a 3 month follow-up visit for diabetes self-management education.

## 2012-04-24 NOTE — Patient Instructions (Signed)
Goals:  Follow Diabetes Meal Plan as instructed  Eat 3 meals and 2 snacks, every 3-5 hrs  Limit carbohydrate intake to 30-45 grams carbohydrate/meal  Limit carbohydrate intake to 15 grams carbohydrate/snack  Add lean protein foods to meals/snacks  Monitor glucose levels as instructed by your doctor  Aim for 30 mins of physical activity daily  Bring food record and glucose log to your next nutrition visit 

## 2012-05-29 ENCOUNTER — Encounter: Payer: Self-pay | Admitting: Internal Medicine

## 2012-05-29 ENCOUNTER — Other Ambulatory Visit (INDEPENDENT_AMBULATORY_CARE_PROVIDER_SITE_OTHER): Payer: 59

## 2012-05-29 ENCOUNTER — Ambulatory Visit (INDEPENDENT_AMBULATORY_CARE_PROVIDER_SITE_OTHER): Payer: 59 | Admitting: Internal Medicine

## 2012-05-29 VITALS — BP 122/80 | HR 97 | Temp 98.6°F | Resp 16 | Wt 248.0 lb

## 2012-05-29 DIAGNOSIS — IMO0001 Reserved for inherently not codable concepts without codable children: Secondary | ICD-10-CM

## 2012-05-29 DIAGNOSIS — G2581 Restless legs syndrome: Secondary | ICD-10-CM

## 2012-05-29 DIAGNOSIS — I10 Essential (primary) hypertension: Secondary | ICD-10-CM

## 2012-05-29 DIAGNOSIS — E785 Hyperlipidemia, unspecified: Secondary | ICD-10-CM

## 2012-05-29 MED ORDER — LISINOPRIL 20 MG PO TABS: 20.0000 mg | ORAL_TABLET | Freq: Every day | ORAL | Status: DC

## 2012-05-29 MED ORDER — GLIPIZIDE 5 MG PO TABS: 5.0000 mg | ORAL_TABLET | Freq: Every day | ORAL | Status: DC

## 2012-05-29 MED ORDER — LOVASTATIN 40 MG PO TABS: 40.0000 mg | ORAL_TABLET | Freq: Every day | ORAL | Status: DC

## 2012-05-29 MED ORDER — CHLORTHALIDONE 25 MG PO TABS: 25.0000 mg | ORAL_TABLET | Freq: Every day | ORAL | Status: DC

## 2012-05-29 MED ORDER — METFORMIN HCL ER (MOD) 1000 MG PO TB24: 1000.0000 mg | ORAL_TABLET | Freq: Two times a day (BID) | ORAL | Status: DC

## 2012-05-29 MED ORDER — SERTRALINE HCL 100 MG PO TABS: 100.0000 mg | ORAL_TABLET | Freq: Every day | ORAL | Status: DC

## 2012-05-29 MED ORDER — ROPINIROLE HCL 0.5 MG PO TABS: 0.5000 mg | ORAL_TABLET | Freq: Three times a day (TID) | ORAL | Status: DC

## 2012-05-29 MED ORDER — INSULIN GLARGINE 100 UNIT/ML ~~LOC~~ SOLN: SUBCUTANEOUS | Status: DC

## 2012-05-29 NOTE — Patient Instructions (Addendum)
Diabetes - PLEASE BE ADHERENT!!! You need to follow the no sugar low carb diet, exercise 10 minutes twice a day - walk the stair, as many  Flights as you can comfortably do in the time alloted. Continue all your present medications. Further recommendations based on the A1C done today.   Restless leg - start the requip 1/2 tab qhs x 2 days then 1 tab qhs x 5 days and if not controlled may increase by 1 tab weekly to a max of 3 g qhs.

## 2012-05-30 LAB — LIPID PANEL
Cholesterol: 233 mg/dL — ABNORMAL HIGH (ref 0–200)
Total CHOL/HDL Ratio: 5

## 2012-05-30 LAB — COMPREHENSIVE METABOLIC PANEL
ALT: 24 U/L (ref 0–35)
Albumin: 3.7 g/dL (ref 3.5–5.2)
CO2: 26 mEq/L (ref 19–32)
GFR: 76.22 mL/min (ref >60.00)
Glucose, Bld: 304 mg/dL — ABNORMAL HIGH (ref 70–99)
Potassium: 3.7 mEq/L (ref 3.5–5.1)
Sodium: 134 mEq/L — ABNORMAL LOW (ref 135–145)
Total Protein: 7.3 g/dL (ref 6.0–8.3)

## 2012-06-02 ENCOUNTER — Encounter: Payer: Self-pay | Admitting: Internal Medicine

## 2012-06-02 DIAGNOSIS — G2581 Restless legs syndrome: Secondary | ICD-10-CM | POA: Insufficient documentation

## 2012-06-02 NOTE — Progress Notes (Signed)
Subjective:    Patient ID: Donna [name], female    DOB: Jun 15, 1966, 46 y.o.   MRN: [id number]  HPI Ms. Lucy presents for follow-up of diabetes and weight management. In the interval since her last visit she has gained 15 lbs and she reports that her CBG remains elevated despite the use of oral agents and basal insulin.   She reports that her sleep is disrupted by an inability to control her leg movements. She is not having cramps but more of a restless leg activity. She does not exercise routinely.  She is actually feeling pretty well otherwise.Marland Kitchen  Past Medical History  Diagnosis Date  . Hypertension   . Diabetes mellitus     type 2- uncontrolled  . Lumbar disc disease   . Hyperlipidemia   . Obesity    Past Surgical History  Procedure Date  . Reduction mammoplasty bilaterally   . Cholecystectomy '96    Lap  . Esi     lumbar spine   Family History  Problem Relation Age of Onset  . Other Father     CHF  . Coronary artery disease Father   . Cancer Father     renal cell carcinoma  . Hypertension Father   . Diabetes Father   . Diabetes Mother   . Coronary artery disease Mother   . Heart attack Mother   . Cancer Paternal Aunt     Breast  . Atrial fibrillation Other   . Peripheral vascular disease Other   . Heart disease Other    History   Social History  . Marital Status: Single    Spouse Name: N/A    Number of Children: N/A  . Years of Education: 16   Occupational History  . NURSE    Social History Main Topics  . Smoking status: Never Smoker   . Smokeless tobacco: Never Used  . Alcohol Use: No  . Drug Use: No  . Sexually Active: Not Currently   Other Topics Concern  . Not on file   Social History Narrative   HSG, [employer]- [employer]. single, Lives in own home- mother lives with her. 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 Denies any h/o physical or sexual abuse    Current Outpatient Prescriptions on File Prior to Visit  Medication Sig Dispense Refill   . chlorthalidone (HYGROTON) 25 MG tablet Take 1 tablet (25 mg total) by mouth daily.  90 tablet  1  . glucose blood test strip Test sugar at least twice daily (one fasting and one random post-prandial  50 each  5  . lisinopril (PRINIVIL,ZESTRIL) 20 MG tablet Take 1 tablet (20 mg total) by mouth daily.  90 tablet  1  . lovastatin (MEVACOR) 40 MG tablet Take 1 tablet (40 mg total) by mouth at bedtime.  30 tablet  7  . metFORMIN (GLUMETZA) 1000 MG (MOD) 24 hr tablet Take 1 tablet (1,000 mg total) by mouth 2 (two) times daily with a meal.  90 tablet  3  . ranitidine (ZANTAC) 150 MG tablet Take 150 mg by mouth daily as needed.        . sertraline (ZOLOFT) 100 MG tablet Take 1 tablet (100 mg total) by mouth daily.  90 tablet  3  . insulin detemir (LEVEMIR) 100 UNIT/ML injection Inject 25 Units into the skin at bedtime. Use flexpen  9 mL  3  . insulin glargine (LANTUS SOLOSTAR) 100 UNIT/ML injection 25 units QHS  And increase by 3 units every  3 days until blood sugar is 150 fasting.  10 mL  3  . rOPINIRole (REQUIP) 0.5 MG tablet Take 1 tablet (0.5 mg total) by mouth 3 (three) times daily. 1/2 tab qhs x 2 days, the 1 tab qhs for 5 days and then may increase by 0.5 weekly to a max of 3 mg  60 tablet  0      Review of Systems System review is negative for any constitutional, cardiac, pulmonary, GI or neuro symptoms or complaints other than as described in the HPI.      Objective:   Physical Exam Filed Vitals:   05/29/12 1609  BP: 122/80  Pulse: 97  Temp: 98.6 F (37 C)  Resp: 16   Wt Readings from Last 3 Encounters:  05/29/12 248 lb (112.492 kg)  02/13/12 233 lb 1.6 oz (105.733 kg)  10/10/11 233 lb 12 oz (106.028 kg)   Gen'l- an obese white woman in no distress Cor- RRR Pulm - normal respirations Neuro - A&O x 3, normal gait and station.  Lab Results  Component Value Date   HGB 16.7* 08/15/2010   HCT 49.0* 08/15/2010   GLUCOSE 304* 05/29/2012   CHOL 233* 05/29/2012   TRIG 531.0  Triglyceride is over 400; calculations on Lipids are invalid.* 05/29/2012   HDL 43.60 05/29/2012   LDLDIRECT 141.4 05/29/2012   ALT 24 05/29/2012   AST 22 05/29/2012   NA 134* 05/29/2012   K 3.7 05/29/2012   CL 98 05/29/2012   CREATININE 0.9 05/29/2012   BUN 16 05/29/2012   CO2 26 05/29/2012   HGBA1C 10.1* 05/29/2012          Assessment & Plan:

## 2012-06-02 NOTE — Assessment & Plan Note (Signed)
Has gone the wrong way!!  Plan Dietary adherence and close attention to portion size management. Should consider 4-6 small meals a day.  Regular exercise- walking the stairs at work for 10 minutes twice a day.

## 2012-06-02 NOTE — Assessment & Plan Note (Signed)
LDL is well above goal of 308 or less for a diabetic.  Plan Weight management and exercise along with a low fat diet  Repeat lab in 3 months - if not at goal will need to institute medical management.

## 2012-06-02 NOTE — Assessment & Plan Note (Signed)
BP Readings from Last 3 Encounters:  05/29/12 122/80  10/10/11 140/74  12/14/10 132/88   OK control

## 2012-06-02 NOTE — Assessment & Plan Note (Signed)
Lab Results  Component Value Date   HGBA1C 10.1* 05/29/2012   This is down from 13% but far from goal.  Plan Double down on  Dietary adherence  Continue present medications

## 2012-06-02 NOTE — Assessment & Plan Note (Signed)
Disrupted sleep due to involuntary leg movement. Oct 10th, '13 - start Requip titration.  Plan Requip 0.5 mg qHS and may titrate up to a maximum to 2 mg

## 2012-06-05 ENCOUNTER — Other Ambulatory Visit: Payer: Self-pay | Admitting: *Deleted

## 2012-06-05 MED ORDER — GLUCOSE BLOOD VI STRP: ORAL_STRIP | Status: DC

## 2012-06-11 ENCOUNTER — Ambulatory Visit: Payer: 59 | Admitting: Internal Medicine

## 2012-07-08 ENCOUNTER — Other Ambulatory Visit: Payer: Self-pay | Admitting: Internal Medicine

## 2012-07-24 ENCOUNTER — Encounter: Payer: 59 | Attending: Internal Medicine | Admitting: *Deleted

## 2012-07-24 ENCOUNTER — Encounter: Payer: Self-pay | Admitting: *Deleted

## 2012-07-24 DIAGNOSIS — IMO0001 Reserved for inherently not codable concepts without codable children: Secondary | ICD-10-CM

## 2012-07-24 DIAGNOSIS — Z713 Dietary counseling and surveillance: Secondary | ICD-10-CM | POA: Insufficient documentation

## 2012-07-24 DIAGNOSIS — E119 Type 2 diabetes mellitus without complications: Secondary | ICD-10-CM | POA: Insufficient documentation

## 2012-07-24 NOTE — Patient Instructions (Addendum)
For every diet coke, drink a glass of water  Goals:  Don't worry right now about carbs or fat or protein, etc.   Listen to internal hunger cues and honor those cues; don't wait until you're ravenous to eat Choose the food(s) you want Enjoy those foods.  Try to make meal last 20 minutes.  Minimize distractions- turn off tv, computer, magazine, etc Stop eating when full.  Honor fullness cues too

## 2012-07-24 NOTE — Progress Notes (Signed)
  Medical Nutrition Therapy:  Appt start time: 0900 end time:  0930.   Assessment:  Primary concerns today: diabetes.   MEDICATIONS: see list   DIETARY INTAKE:  Usual eating pattern includes 3 meals and 2 snacks per day.  Everyday foods include energy-dense foods.  Avoided foods include none.    24-hr recall:  B ( AM): 2 bacon egg and cheese biscuits with tots Snk ( AM): cookies  L ( PM): footlong sub with chips Snk ( PM): maybe chips or cookies D ( PM): chicken and mashed potatoes and corn; meatloaf and vegetables Snk ( PM): dessert Beverages: diet coke, splenda tea  Usual physical activity: none  Estimated energy needs: 1600 calories 180 g carbohydrates 120 g protein 44 g fat  Progress Towards Goal(s):  No progress.   Nutritional Diagnosis:  NB-1.6 Limited adherence to nutrition-related recommendations As related to diabetic meal planning.  As evidenced by weight gain and uncontrolled blood glucose (need for insulin).    Intervention:  Nutrition counseling provided.  Nikita admits to being noncompliant with lifestyle modifications. Her blood glucose remains elevated and she was recently put on insulin.  She has gained 11 pounds since last visit.  Her potions continue to be large and she isn't following any meal plan currently.  She's also not active.   Encouraged patient to honor their body's internal hunger and fullness cues.  Throughout the day, check in mentally and rate hunger.  Try not to eat when ravenous, but instead when slightly hungry.  Then choose food(s) that will be satisfying regardless of nutritional content.  Sit down to enjoy those foods.  Minimize distractions: turn off tv, put away books, work, Programmer, applications.  Make the meal last at least 20 minutes in order to give time to experience and register satiety.  Stop eating when full regardless of how much food is left on the plate.  Get more if still hungry.  The key is to honor fullness so throughout the meal, rate  fullness factor and stop when comfortably full, but not stuffed.  Pay attention to what the internal cues are, rather than any external factors.  Monitoring/Evaluation:  Dietary intake, exercise, BGM, and body weight prn.  Emonie will call for follow up if needed

## 2012-09-03 ENCOUNTER — Other Ambulatory Visit: Payer: Self-pay | Admitting: Internal Medicine

## 2012-09-04 ENCOUNTER — Other Ambulatory Visit: Payer: Self-pay | Admitting: *Deleted

## 2012-09-05 MED ORDER — ROPINIROLE HCL 3 MG PO TABS: 3.0000 mg | ORAL_TABLET | Freq: Every day | ORAL | Status: DC

## 2013-01-05 ENCOUNTER — Other Ambulatory Visit: Payer: Self-pay | Admitting: Internal Medicine

## 2013-03-16 ENCOUNTER — Other Ambulatory Visit: Payer: Self-pay | Admitting: Internal Medicine

## 2013-06-25 ENCOUNTER — Other Ambulatory Visit: Payer: Self-pay

## 2013-06-26 ENCOUNTER — Other Ambulatory Visit: Payer: Self-pay | Admitting: Internal Medicine

## 2013-07-15 ENCOUNTER — Encounter: Payer: Self-pay | Admitting: Internal Medicine

## 2013-07-15 ENCOUNTER — Ambulatory Visit (INDEPENDENT_AMBULATORY_CARE_PROVIDER_SITE_OTHER): Payer: 59 | Admitting: Internal Medicine

## 2013-07-15 ENCOUNTER — Ambulatory Visit (INDEPENDENT_AMBULATORY_CARE_PROVIDER_SITE_OTHER): Payer: 59

## 2013-07-15 VITALS — BP 134/94 | HR 99 | Temp 98.1°F | Wt 229.0 lb

## 2013-07-15 DIAGNOSIS — E1149 Type 2 diabetes mellitus with other diabetic neurological complication: Secondary | ICD-10-CM

## 2013-07-15 DIAGNOSIS — I10 Essential (primary) hypertension: Secondary | ICD-10-CM

## 2013-07-15 DIAGNOSIS — Z0000 Encounter for general adult medical examination without abnormal findings: Secondary | ICD-10-CM

## 2013-07-15 DIAGNOSIS — IMO0001 Reserved for inherently not codable concepts without codable children: Secondary | ICD-10-CM

## 2013-07-15 DIAGNOSIS — E785 Hyperlipidemia, unspecified: Secondary | ICD-10-CM

## 2013-07-15 DIAGNOSIS — G2581 Restless legs syndrome: Secondary | ICD-10-CM

## 2013-07-15 LAB — COMPREHENSIVE METABOLIC PANEL
ALT: 35 U/L (ref 0–35)
AST: 31 U/L (ref 0–37)
Albumin: 4.3 g/dL (ref 3.5–5.2)
Alkaline Phosphatase: 64 U/L (ref 39–117)
CO2: 30 mEq/L (ref 19–32)
Chloride: 95 mEq/L — ABNORMAL LOW (ref 96–112)
GFR: 109.17 mL/min (ref >60.00)
Glucose, Bld: 189 mg/dL — ABNORMAL HIGH (ref 70–99)
Potassium: 3.4 mEq/L — ABNORMAL LOW (ref 3.5–5.1)
Sodium: 132 mEq/L — ABNORMAL LOW (ref 135–145)
Total Protein: 8.2 g/dL (ref 6.0–8.3)

## 2013-07-15 LAB — HEMOGLOBIN AND HEMATOCRIT, BLOOD: HCT: 44.9 % (ref 36.0–46.0)

## 2013-07-15 LAB — HEPATIC FUNCTION PANEL
Albumin: 4.3 g/dL (ref 3.5–5.2)
Alkaline Phosphatase: 64 U/L (ref 39–117)
Total Bilirubin: 0.8 mg/dL (ref 0.3–1.2)
Total Protein: 8.2 g/dL (ref 6.0–8.3)

## 2013-07-15 LAB — HEMOGLOBIN A1C: Hgb A1c MFr Bld: 12.1 % — ABNORMAL HIGH (ref 4.6–6.5)

## 2013-07-15 LAB — LIPID PANEL
Cholesterol: 230 mg/dL — ABNORMAL HIGH (ref 0–200)
Total CHOL/HDL Ratio: 5
Triglycerides: 252 mg/dL — ABNORMAL HIGH (ref 0.0–149.0)

## 2013-07-15 MED ORDER — GABAPENTIN 100 MG PO CAPS: 100.0000 mg | ORAL_CAPSULE | Freq: Every day | ORAL | Status: DC

## 2013-07-15 NOTE — Progress Notes (Signed)
Pre visit review using our clinic review tool, if applicable. No additional management support is needed unless otherwise documented below in the visit note. 

## 2013-07-15 NOTE — Patient Instructions (Signed)
Thank you for coming in to see [place].  Your exam does reveal bilateral ear wax blockage, otherwise ok. Will need to do a diabetic foot exam at your next visit. You can schedule a nurse visit for ear irrigation.  Routine labs today - will report results via MyChart.  You describe pain that is consistent with diabetic peripheral neuropathy. Please be sure to check your feet every day. For relief of pain will have you start Gabapenitin - start low and work up the dose as needed. Maximum safe dose is 3,000 mg daily, we will start with 100 mg at bedtime. If after 10 days it is not really better go to 200 mg and may go to 300 mg 10 days later if needed. If there is still a problem send me a MyChart message for further instructions.  Hooray - a 30 lb weight loss in the last year. Go Girl!!!  Menopause - if the symptoms get really bad we can consider hormone replacement therapy.  Do give consideration to pneumonia immunization.  See you in 3 months.

## 2013-07-15 NOTE — Progress Notes (Signed)
Subjective:    Patient ID: Donna [name], female    DOB: Dec 09, 1965, 47 y.o.   MRN: [id number]  HPI Donna [name] presents for follow up of metabolic syndrome with her last visit being October '13. In the interval she has been seen by [facility] care management with A1C in July '14 10.8% up from 8/8 % previous. She has not had other interval lab work done.   In the interval she has had no major illness, no surgery and no injury. She had a PAP smear in 2012 that was normal and due for repeat in 2015. She has not been to the dentist. She did see optometrist today - she reports no diabetic retinopathy, did have increased intra-ocular pressure and will be seen for follow.  Past Medical History  Diagnosis Date  . Hypertension   . Diabetes mellitus     type 2- uncontrolled  . Lumbar disc disease   . Hyperlipidemia   . Obesity    Past Surgical History  Procedure Laterality Date  . Reduction mammoplasty bilaterally    . Cholecystectomy  '96    Lap  . Esi      lumbar spine   Family History  Problem Relation Age of Onset  . Other Father     CHF  . Coronary artery disease Father   . Cancer Father     renal cell carcinoma  . Hypertension Father   . Diabetes Father   . Diabetes Mother   . Coronary artery disease Mother   . Heart attack Mother   . Cancer Paternal Aunt     Breast  . Atrial fibrillation Other   . Peripheral vascular disease Other   . Heart disease Other    History   Social History  . Marital Status: Single    Spouse Name: N/A    Number of Children: N/A  . Years of Education: 16   Occupational History  . NURSE    Social History Main Topics  . Smoking status: Never Smoker   . Smokeless tobacco: Never Used  . Alcohol Use: No  . Drug Use: No  . Sexual Activity: Not Currently   Other Topics Concern  . Not on file   Social History Narrative   HSG, [employer]- [employer]. single, Lives in own home- mother lives with her. 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 Denies  any h/o physical or sexual abuse    Current Outpatient Prescriptions on File Prior to Visit  Medication Sig Dispense Refill  . BD PEN NEEDLE NANO U/F 32G X 4 MM MISC USE AS DIRECTED  100 each  0  . chlorthalidone (HYGROTON) 25 MG tablet TAKE 1 TABLET BY MOUTH ONCE DAILY  90 tablet  1  . glipiZIDE (GLUCOTROL) 5 MG tablet Take 1 tablet (5 mg total) by mouth daily.  90 tablet  3  . glucose blood test strip Test sugar at least twice daily (one fasting and one random post-prandial  50 each  5  . lisinopril (PRINIVIL,ZESTRIL) 20 MG tablet TAKE 1 TABLET BY MOUTH ONCE DAILY  90 tablet  1  . lovastatin (MEVACOR) 40 MG tablet Take 1 tablet (40 mg total) by mouth at bedtime.  30 tablet  7  . metFORMIN (GLUCOPHAGE) 1000 MG tablet TAKE 1 TABLET BY MOUTH 2 TIMES A DAY  60 tablet  6  . ranitidine (ZANTAC) 150 MG tablet Take 150 mg by mouth daily as needed.        . sertraline (  ZOLOFT) 100 MG tablet Take 1 tablet (100 mg total) by mouth daily.  90 tablet  3   No current facility-administered medications on file prior to visit.    Review of Systems Constitutional:  Negative for fever, chills, activity change and unexpected weight change.  HEENT:  Negative for hearing loss, ear pain, congestion, neck stiffness and postnasal drip. Negative for sore throat or swallowing problems. Negative for dental complaints.   Eyes: Negative for vision loss or change in visual acuity.  Respiratory: Negative for chest tightness and wheezing. Negative for DOE.   Cardiovascular: Negative for chest pain or palpitations. No decreased exercise tolerance Gastrointestinal: No change in bowel habit. No bloating or gas. No reflux or indigestion Genitourinary: Negative for urgency, frequency, flank pain and difficulty urinating.  Musculoskeletal: Negative for myalgias, back pain, arthralgias and gait problem.  Neurological: Negative for dizziness, tremors, weakness and headaches.  Hematological: Negative for adenopathy.    Psychiatric/Behavioral: Negative for behavioral problems and dysphoric mood.       Objective:   Physical Exam Filed Vitals:   07/15/13 1537  BP: 134/94  Pulse: 99  Temp: 98.1 F (36.7 C)   Wt Readings from Last 3 Encounters:  07/15/13 229 lb (103.874 kg)  07/24/12 244 lb 1.6 oz (110.723 kg)  05/29/12 248 lb (112.492 kg)   Gen'l: well nourished, well developed overweight  Woman in no distress HEENT - [place]/AT, EACs/TMs normal, oropharynx with native dentition in good condition, no buccal or palatal lesions, posterior pharynx clear, mucous membranes moist. C&S clear, PERRLA, fundi - normal Neck - supple, no thyromegaly Nodes- negative submental, cervical, supraclavicular regions Chest - no deformity, no CVAT Lungs - clear without rales, wheezes. No increased work of breathing Breast - s/p reduction mammoplasty with some scarring. Skin otherwise normal, nipples reconstructed, no fixed mass or abnormality with some fibrous changes. Cardiovascular - regular rate and rhythm, quiet precordium, no murmurs, rubs or gallops, 2+ radial, DP and PT pulses Abdomen - BS+ x 4, no HSM, no guarding or rebound or tenderness Pelvic - deferred to gyn to recent exam (2012) Rectal - deferred  Extremities - no clubbing, cyanosis, edema or deformity.  Neuro - A&O x 3, CN II-XII normal, motor strength normal and equal, DTRs 2+ and symmetrical biceps, radial, and patellar tendons. Cerebellar - no tremor, no rigidity, fluid movement and normal gait. See DM foot exam. Derm - Head, neck, back, abdomen and extremities without suspicious lesions  Recent Results (from the past 2160 hour(s))  HEMOGLOBIN A1C     Status: Abnormal   Collection Time    07/15/13  5:01 PM      Result Value Range   Hemoglobin A1C 12.1 (*) 4.6 - 6.5 %   Comment: Glycemic Control Guidelines for People with Diabetes:Non Diabetic:  <6%Goal of Therapy: <7%Additional Action Suggested:  >8%   HEPATIC FUNCTION PANEL     Status: None    Collection Time    07/15/13  5:01 PM      Result Value Range   Total Bilirubin 0.8  0.3 - 1.2 mg/dL   Bilirubin, Direct 0.1  0.0 - 0.3 mg/dL   Alkaline Phosphatase 64  39 - 117 U/L   AST 31  0 - 37 U/L   ALT 35  0 - 35 U/L   Total Protein 8.2  6.0 - 8.3 g/dL   Albumin 4.3  3.5 - 5.2 g/dL  COMPREHENSIVE METABOLIC PANEL     Status: Abnormal   Collection Time  07/15/13  5:01 PM      Result Value Range   Sodium 132 (*) 135 - 145 mEq/L   Potassium 3.4 (*) 3.5 - 5.1 mEq/L   Chloride 95 (*) 96 - 112 mEq/L   CO2 30  19 - 32 mEq/L   Glucose, Bld 189 (*) 70 - 99 mg/dL   BUN 13  6 - 23 mg/dL   Creatinine, Ser 0.6  0.4 - 1.2 mg/dL   Total Bilirubin 0.8  0.3 - 1.2 mg/dL   Alkaline Phosphatase 64  39 - 117 U/L   AST 31  0 - 37 U/L   ALT 35  0 - 35 U/L   Total Protein 8.2  6.0 - 8.3 g/dL   Albumin 4.3  3.5 - 5.2 g/dL   Calcium 9.6  8.4 - 16.1 mg/dL   GFR 096.04  >54.09 mL/min  LIPID PANEL     Status: Abnormal   Collection Time    07/15/13  5:01 PM      Result Value Range   Cholesterol 230 (*) 0 - 200 mg/dL   Comment: ATP III Classification       Desirable:  < 200 mg/dL               Borderline High:  200 - 239 mg/dL          High:  > = 811 mg/dL   Triglycerides 914.7 (*) 0.0 - 149.0 mg/dL   Comment: Normal:  <829 mg/dLBorderline High:  150 - 199 mg/dL   HDL 56.21  >30.86 mg/dL   VLDL 57.8 (*) 0.0 - 46.9 mg/dL   Total CHOL/HDL Ratio 5     Comment:                Men          Women1/2 Average Risk     3.4          3.3Average Risk          5.0          4.42X Average Risk          9.6          7.13X Average Risk          15.0          11.0                      HEMOGLOBIN AND HEMATOCRIT, BLOOD     Status: Abnormal   Collection Time    07/15/13  5:01 PM      Result Value Range   Hemoglobin 15.5 (*) 12.0 - 15.0 g/dL   HCT 62.9  52.8 - 41.3 %  LDL CHOLESTEROL, DIRECT     Status: None   Collection Time    07/15/13  5:01 PM      Result Value Range   Direct LDL 154.9     Comment: Optimal:   <100 mg/dLNear or Above Optimal:  100-129 mg/dLBorderline High:  130-159 mg/dLHigh:  160-189 mg/dLVery High:  >190 mg/dL           Assessment & Plan:

## 2013-07-17 ENCOUNTER — Other Ambulatory Visit: Payer: Self-pay | Admitting: Internal Medicine

## 2013-07-17 DIAGNOSIS — IMO0001 Reserved for inherently not codable concepts without codable children: Secondary | ICD-10-CM

## 2013-07-17 DIAGNOSIS — E785 Hyperlipidemia, unspecified: Secondary | ICD-10-CM

## 2013-07-17 LAB — LDL CHOLESTEROL, DIRECT: Direct LDL: 154.9 mg/dL

## 2013-07-17 MED ORDER — ATORVASTATIN CALCIUM 80 MG PO TABS: 80.0000 mg | ORAL_TABLET | Freq: Every day | ORAL | Status: DC

## 2013-07-17 MED ORDER — SITAGLIPTIN PHOSPHATE 100 MG PO TABS: 100.0000 mg | ORAL_TABLET | Freq: Every day | ORAL | Status: DC

## 2013-07-18 DIAGNOSIS — Z0000 Encounter for general adult medical examination without abnormal findings: Secondary | ICD-10-CM | POA: Insufficient documentation

## 2013-07-18 DIAGNOSIS — E1149 Type 2 diabetes mellitus with other diabetic neurological complication: Secondary | ICD-10-CM | POA: Insufficient documentation

## 2013-07-18 NOTE — Assessment & Plan Note (Signed)
Interval history without major illness, surgery or injury. She has done a GREAT job with weight loss, exceeding goal of 1-2 lbs per month. She is current with cervical cancer screening and with breast exam. Immunizations - needs to consider pnemonia vaccines.  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

## 2013-07-18 NOTE — Assessment & Plan Note (Signed)
LDL 154 on mevacor, above goal of 100 or less  Plan D/c mevacor  Start Lipitor 80 mg once a day  F/u lab 3 months

## 2013-07-18 NOTE — Assessment & Plan Note (Signed)
BP Readings from Last 3 Encounters:  07/15/13 134/94  05/29/12 122/80  10/10/11 140/74   OK control.  Plan Continue present medications  Continue weight loss.

## 2013-07-18 NOTE — Assessment & Plan Note (Signed)
Body mass index is 36.98 kg/(m^2). down from 39 with 23 lb weight loss.  Plan Continue good work on [employer].

## 2013-07-18 NOTE — Assessment & Plan Note (Signed)
Peripheral neuropathy with burning legs at night.  Plan Will start gabapentin at 100 mg qHS and may titrate up the dose as needed.

## 2013-07-18 NOTE — Assessment & Plan Note (Signed)
Patient stopped Requip - seems to be doing ok. Now with peripheral neuropathy

## 2013-07-18 NOTE — Assessment & Plan Note (Signed)
A1c WAY too high.  Plan Continue work on diet and exercise  Continue lantus and metformin  Add Januvia 100 mg daily  Repeat A1C 3 months

## 2013-08-21 ENCOUNTER — Other Ambulatory Visit: Payer: Self-pay | Admitting: Internal Medicine

## 2013-08-25 ENCOUNTER — Encounter: Payer: Self-pay | Admitting: Internal Medicine

## 2013-08-25 ENCOUNTER — Ambulatory Visit (INDEPENDENT_AMBULATORY_CARE_PROVIDER_SITE_OTHER): Payer: 59 | Admitting: Internal Medicine

## 2013-08-25 VITALS — BP 120/90 | HR 95 | Temp 97.7°F | Wt 232.8 lb

## 2013-08-25 DIAGNOSIS — E1165 Type 2 diabetes mellitus with hyperglycemia: Secondary | ICD-10-CM

## 2013-08-25 DIAGNOSIS — IMO0001 Reserved for inherently not codable concepts without codable children: Secondary | ICD-10-CM

## 2013-08-25 DIAGNOSIS — E1149 Type 2 diabetes mellitus with other diabetic neurological complication: Secondary | ICD-10-CM

## 2013-08-25 MED ORDER — GABAPENTIN 300 MG PO CAPS: 100.0000 mg | ORAL_CAPSULE | Freq: Three times a day (TID) | ORAL | Status: DC

## 2013-08-25 NOTE — Assessment & Plan Note (Signed)
Still having a lot of foot pain that is worse at night. On exam there is decreased deep vibratory and soft touch sensation.  Plan Increase gabapentin to 300 mg bid and if tolerated will increase to tid.

## 2013-08-25 NOTE — Progress Notes (Signed)
Pre visit review using our clinic review tool, if applicable. No additional management support is needed unless otherwise documented below in the visit note. 

## 2013-08-25 NOTE — Patient Instructions (Signed)
1. Still having a lot of foot pain that is worse at night. On exam there is decreased deep vibratory and soft touch sensation.  Plan Increase gabapentin to 300 mg bid and if tolerated will increase to tid.  2. Diabetes is not adequately controlled on two oral agents and basal insulin.  Plan Refer to Dr. Tera Helperhristina Gerghe

## 2013-08-26 ENCOUNTER — Telehealth: Payer: Self-pay

## 2013-08-26 NOTE — Assessment & Plan Note (Signed)
Lab Results  Component Value Date   HGBA1C 12.1* 07/15/2013   On basal insulin at high dose and metformin and DPP4  Plan Refer to endocrinology for help with management.

## 2013-08-26 NOTE — Progress Notes (Signed)
   Subjective:    Patient ID: Donna [name], female    DOB: 09/05/1965, 48 y.o.   MRN: [id number]  HPI [name] presents for persistent nocturnal foot pain: a pin-sticking type pain unrelieved by gabapentin 300 mg qHS.  She has been monitoring CBGs - on lantus 100 units/day and oral meds she continues to have readings consistently greater than 200.   PMH, FamHx and SocHx reviewed for any changes and relevance.  Current Outpatient Prescriptions on File Prior to Visit  Medication Sig Dispense Refill  . atorvastatin (LIPITOR) 80 MG tablet Take 1 tablet (80 mg total) by mouth daily.  30 tablet  11  . BD PEN NEEDLE NANO U/F 32G X 4 MM MISC USE AS DIRECTED  100 each  0  . chlorthalidone (HYGROTON) 25 MG tablet TAKE 1 TABLET BY MOUTH ONCE DAILY  90 tablet  1  . glucose blood test strip Test sugar at least twice daily (one fasting and one random post-prandial  50 each  5  . Insulin Glargine (LANTUS SOLOSTAR) 100 UNIT/ML SOPN INJECT 50 UNITS SUBCUTANEOUSLY BID-MAY INCREASE BY 3UNITS EVERY 3 DAYS UNTIL REACH FBS OF 150      . lisinopril (PRINIVIL,ZESTRIL) 20 MG tablet TAKE 1 TABLET BY MOUTH ONCE DAILY  90 tablet  1  . metFORMIN (GLUCOPHAGE) 1000 MG tablet TAKE 1 TABLET BY MOUTH 2 TIMES A DAY  60 tablet  6  . ranitidine (ZANTAC) 150 MG tablet Take 150 mg by mouth daily as needed.        . sertraline (ZOLOFT) 100 MG tablet TAKE 1 TABLET BY MOUTH ONCE DAILY  90 tablet  3  . sitaGLIPtin (JANUVIA) 100 MG tablet Take 1 tablet (100 mg total) by mouth daily.  30 tablet  11   No current facility-administered medications on file prior to visit.      Review of Systems System review is negative for any constitutional, cardiac, pulmonary, GI or neuro symptoms or complaints other than as described in the HPI.     Objective:   Physical Exam Filed Vitals:   08/25/13 1122  BP: 120/90  Pulse: 95  Temp: 97.7 F (36.5 C)   Wt Readings from Last 3 Encounters:  08/25/13 232 lb 12.8 oz (105.597 kg)    07/15/13 229 lb (103.874 kg)  07/24/12 244 lb 1.6 oz (110.723 kg)   Gen'l- overweight woman in no distress Cor- RRR Pulm - normal respirations Neuro - see DM foot       Assessment & Plan:

## 2013-08-26 NOTE — Telephone Encounter (Signed)
Relevant patient education assigned to patient using Emmi. ° °

## 2013-09-01 ENCOUNTER — Ambulatory Visit: Payer: 59 | Admitting: Internal Medicine

## 2013-09-07 ENCOUNTER — Other Ambulatory Visit: Payer: Self-pay | Admitting: Internal Medicine

## 2013-12-25 ENCOUNTER — Other Ambulatory Visit: Payer: Self-pay

## 2013-12-25 MED ORDER — GLUCOSE BLOOD VI STRP: ORAL_STRIP | Status: DC

## 2013-12-25 MED ORDER — BD PEN NEEDLE NANO U/F 32G X 4 MM MISC: Status: DC

## 2013-12-25 MED ORDER — LANCETS MISC: Status: DC

## 2014-02-18 ENCOUNTER — Telehealth: Payer: Self-pay | Admitting: *Deleted

## 2014-02-18 DIAGNOSIS — E1165 Type 2 diabetes mellitus with hyperglycemia: Principal | ICD-10-CM

## 2014-02-18 DIAGNOSIS — IMO0001 Reserved for inherently not codable concepts without codable children: Secondary | ICD-10-CM

## 2014-02-18 NOTE — Telephone Encounter (Signed)
Left message on machine for patient to call back and schedule a follow diabetes appointment. Lipid, a1c, bmet ordered.

## 2014-03-17 ENCOUNTER — Other Ambulatory Visit: Payer: Self-pay

## 2014-03-17 MED ORDER — GABAPENTIN 300 MG PO CAPS: 100.0000 mg | ORAL_CAPSULE | Freq: Three times a day (TID) | ORAL | Status: DC

## 2014-03-17 NOTE — Telephone Encounter (Signed)
Temp. Refill request

## 2014-03-26 ENCOUNTER — Ambulatory Visit (INDEPENDENT_AMBULATORY_CARE_PROVIDER_SITE_OTHER): Payer: 59

## 2014-03-26 DIAGNOSIS — E1165 Type 2 diabetes mellitus with hyperglycemia: Principal | ICD-10-CM

## 2014-03-26 DIAGNOSIS — IMO0001 Reserved for inherently not codable concepts without codable children: Secondary | ICD-10-CM

## 2014-03-26 LAB — LIPID PANEL
CHOLESTEROL: 142 mg/dL (ref 0–200)
HDL: 31.8 mg/dL — ABNORMAL LOW (ref >39.00)
NonHDL: 110.2
Total CHOL/HDL Ratio: 4
VLDL: 84.8 mg/dL — ABNORMAL HIGH (ref 0.0–40.0)

## 2014-03-26 LAB — BASIC METABOLIC PANEL
BUN: 17 mg/dL (ref 6–23)
CO2: 30 mEq/L (ref 19–32)
Calcium: 9.3 mg/dL (ref 8.4–10.5)
Chloride: 93 mEq/L — ABNORMAL LOW (ref 96–112)
Creatinine, Ser: 0.6 mg/dL (ref 0.4–1.2)
GFR: 110.91 mL/min (ref >60.00)
Glucose, Bld: 308 mg/dL — ABNORMAL HIGH (ref 70–99)
Potassium: 3.6 mEq/L (ref 3.5–5.1)
Sodium: 133 mEq/L — ABNORMAL LOW (ref 135–145)

## 2014-03-26 LAB — LDL CHOLESTEROL, DIRECT: Direct LDL: 80 mg/dL

## 2014-03-29 LAB — HEMOGLOBIN A1C: Hgb A1c MFr Bld: 11.8 % — ABNORMAL HIGH (ref 4.6–6.5)

## 2014-03-30 ENCOUNTER — Encounter: Payer: Self-pay | Admitting: Internal Medicine

## 2014-03-30 ENCOUNTER — Ambulatory Visit (INDEPENDENT_AMBULATORY_CARE_PROVIDER_SITE_OTHER): Payer: 59 | Admitting: Internal Medicine

## 2014-03-30 VITALS — BP 136/88 | HR 99 | Temp 98.2°F | Wt 227.5 lb

## 2014-03-30 DIAGNOSIS — E1142 Type 2 diabetes mellitus with diabetic polyneuropathy: Secondary | ICD-10-CM

## 2014-03-30 DIAGNOSIS — E1149 Type 2 diabetes mellitus with other diabetic neurological complication: Secondary | ICD-10-CM

## 2014-03-30 DIAGNOSIS — E785 Hyperlipidemia, unspecified: Secondary | ICD-10-CM

## 2014-03-30 DIAGNOSIS — E1140 Type 2 diabetes mellitus with diabetic neuropathy, unspecified: Secondary | ICD-10-CM

## 2014-03-30 MED ORDER — CANAGLIFLOZIN 100 MG PO TABS: 100.0000 mg | ORAL_TABLET | Freq: Every morning | ORAL | Status: DC

## 2014-03-30 MED ORDER — PREGABALIN 75 MG PO CAPS: 75.0000 mg | ORAL_CAPSULE | Freq: Three times a day (TID) | ORAL | Status: DC

## 2014-03-30 NOTE — Progress Notes (Signed)
Pre visit review using our clinic review tool, if applicable. No additional management support is needed unless otherwise documented below in the visit note. 

## 2014-03-30 NOTE — Patient Instructions (Signed)
The most common cause of elevated triglycerides (TG) is the ingestion of sugar from high fructose corn syrup sources added to processed foods & drinks.  Eat a low-fat diet with lots of fruits and vegetables, up to 7-9 servings per day. Consume less than  30  Grams (preferably ZERO) of sugar per day from foods & drinks with High Fructose Corn Syrup (HFCS) sugar as #1,2,3 or # 4 on label.Whole Foods, [employer] & [employer] do not carry products with HFCS. Follow a  low carb nutrition program such as [place] or The New Sugar Busters  to prevent Diabetes progression . White carbohydrates (potatoes, rice, bread, and pasta) have a high spike of sugar and a high load of sugar. For example a  baked potato has a cup of sugar and a  french fry  2 teaspoons of sugar. Yams, wild  rice, whole grained bread &  wheat pasta have been much lower spike and load of  sugar. Portions should be the size of a deck of cards or your palm.

## 2014-03-31 DIAGNOSIS — E1140 Type 2 diabetes mellitus with diabetic neuropathy, unspecified: Secondary | ICD-10-CM | POA: Insufficient documentation

## 2014-03-31 NOTE — Progress Notes (Signed)
   Subjective:    Patient ID: Donna [name], female    DOB: 02/10/1966, 48 y.o.   MRN: [id number]  HPI She is here to follow-up on her diabetes and dyslipidemia. She will miss her medications on the weekends often. She does not monitor glucose. She is on no nutritional exercise program. Her ophthalmology exam is up-to-date. There's some question of possible glaucoma. She's had no podiatry exam She does have slightly blurred vision and excessive tearing of her eyes. Her major concern is severe burning in her feet despite taking gabapentin.  A1c 11.8% with average glucose 344 & 136% increased risk.TG 424;HDL 31.8 & LDL 80. Both her mother and father had diabetes as did her maternal grandfather and paternal grandmother.      Review of Systems  Polyuria, polyphagia, polydipsia absent. There is no  double vision or loss of vision.  Also denied are numbness or tingling of the extremities. No nonhealing skin lesions present. Weight is stable.   Chest pain, palpitations, tachycardia, exertional dyspnea, paroxysmal nocturnal dyspnea, claudication or edema are absent.       Objective:   Physical Exam Gen.: Healthy and well-nourished in appearance. Alert, appropriate and cooperative throughout exam. As per CDC Guidelines ,Epic documents obesity as being present . Appears younger than stated age  Head: Normocephalic without obvious abnormalities  Eyes: No corneal or conjunctival inflammation noted. Pupils equal round reactive to light and accommodation. Extraocular motion intact.  Ears: External  ear exam reveals no significant lesions or deformities.  Nose: External nasal exam reveals no deformity or inflammation. Nasal mucosa are pink and moist. No lesions or exudates noted.   Mouth: Oral mucosa and oropharynx reveal no lesions or exudates. Teeth in good repair. Neck: No deformities, masses, or tenderness noted. Thyroid normal. Lungs: Normal respiratory effort; chest expands symmetrically.  Lungs are clear to auscultation without rales, wheezes, or increased work of breathing. Heart: Normal rate and rhythm. Normal S1 and S2. No gallop, click, or rub. No murmur.S4 Abdomen: Protuberant.Bowel sounds normal; abdomen soft and nontender. No masses, organomegaly or hernias noted. Genitalia: as per Gyn                                  Musculoskeletal/extremities: No deformity or scoliosis noted of  the thoracic or lumbar spine.  No clubbing, cyanosis, edema, or significant extremity  deformity noted. Range of motion normal .Tone & strength normal. Hand joints normal Fingernail / toenail health good. Able to lie down & sit up w/o help. Negative SLR bilaterally Vascular: Carotid, radial artery, dorsalis pedis and  posterior tibial pulses are full and equal. No bruits present. Neurologic: Alert and oriented x3. Deep tendon reflexes symmetrical and normal.  Gait normal .       Skin: Intact without suspicious lesions or rashes. Lymph: No cervical, axillary lymphadenopathy present. Psych: Mood and affect are normal. Normally interactive                                                                                        Assessment & Plan:

## 2014-03-31 NOTE — Assessment & Plan Note (Signed)
Eat a low-fat diet with lots of fruits and vegetables, up to 7-9 servings per day. Consume less than 40 30  Grams (preferably ZERO) of sugar per day from foods & drinks with High Fructose Corn Syrup (HFCS) sugar as #1,2,3 or # 4 on label. Follow a  low carb nutrition program such as [place] or [employer] Sugar [employer]usters  to prevent Diabetes progression . White carbohydrates (potatoes, rice, bread, and pasta) have a high spike of sugar and a high load of sugar. For example a  baked potato has a cup of sugar and a  french fry  2 teaspoons of sugar. Yams, wild  rice, whole grained bread &  wheat pasta have been much lower spike and load of  sugar. Portions should be the size of a deck of cards or your palm. Fasting lipids in Nov

## 2014-03-31 NOTE — Assessment & Plan Note (Signed)
Gabapentin changed to Lyrica

## 2014-03-31 NOTE — Assessment & Plan Note (Signed)
Add Invokana 100 mg Nutrition discussed Labs in Nov

## 2014-04-02 ENCOUNTER — Other Ambulatory Visit: Payer: Self-pay | Admitting: *Deleted

## 2014-04-02 MED ORDER — INSULIN GLARGINE 100 UNIT/ML SOLOSTAR PEN: PEN_INJECTOR | SUBCUTANEOUS | Status: DC

## 2014-04-02 MED ORDER — METFORMIN HCL 1000 MG PO TABS: ORAL_TABLET | ORAL | Status: DC

## 2014-05-25 ENCOUNTER — Encounter: Payer: Self-pay | Admitting: Internal Medicine

## 2014-05-25 ENCOUNTER — Ambulatory Visit (INDEPENDENT_AMBULATORY_CARE_PROVIDER_SITE_OTHER): Payer: 59 | Admitting: Internal Medicine

## 2014-05-25 ENCOUNTER — Other Ambulatory Visit (INDEPENDENT_AMBULATORY_CARE_PROVIDER_SITE_OTHER): Payer: 59

## 2014-05-25 VITALS — BP 132/82 | HR 83 | Temp 97.8°F | Resp 14 | Ht 66.0 in | Wt 225.4 lb

## 2014-05-25 DIAGNOSIS — E1140 Type 2 diabetes mellitus with diabetic neuropathy, unspecified: Secondary | ICD-10-CM | POA: Diagnosis not present

## 2014-05-25 DIAGNOSIS — F329 Major depressive disorder, single episode, unspecified: Secondary | ICD-10-CM

## 2014-05-25 DIAGNOSIS — G2581 Restless legs syndrome: Secondary | ICD-10-CM | POA: Diagnosis not present

## 2014-05-25 DIAGNOSIS — IMO0002 Reserved for concepts with insufficient information to code with codable children: Secondary | ICD-10-CM

## 2014-05-25 DIAGNOSIS — Z23 Encounter for immunization: Secondary | ICD-10-CM

## 2014-05-25 DIAGNOSIS — F32A Depression, unspecified: Secondary | ICD-10-CM

## 2014-05-25 DIAGNOSIS — E1165 Type 2 diabetes mellitus with hyperglycemia: Secondary | ICD-10-CM

## 2014-05-25 DIAGNOSIS — I10 Essential (primary) hypertension: Secondary | ICD-10-CM

## 2014-05-25 DIAGNOSIS — E785 Hyperlipidemia, unspecified: Secondary | ICD-10-CM

## 2014-05-25 DIAGNOSIS — K219 Gastro-esophageal reflux disease without esophagitis: Secondary | ICD-10-CM

## 2014-05-25 LAB — HEMOGLOBIN A1C: HEMOGLOBIN A1C: 8.3 % — AB (ref 4.6–6.5)

## 2014-05-25 NOTE — Assessment & Plan Note (Signed)
Doing well on zoloft and will continue. This is a stressful time of year for her with budgets and holidays.

## 2014-05-25 NOTE — Progress Notes (Signed)
Pre visit review using our clinic review tool, if applicable. No additional management support is needed unless otherwise documented below in the visit note. 

## 2014-05-25 NOTE — Assessment & Plan Note (Signed)
Goal LDL <100 and continue lipitor. Recheck lipid panel next year.

## 2014-05-25 NOTE — Patient Instructions (Signed)
We will check on your HgA1c today and also on your iron levels as this can sometimes give you restlessness in your legs.   We will call you back with the results of the blood work and as long as the diabetes is doing better we will not change your medicines.   We will see you back in about 3-4 months to check on the sugars. Keep working on taking your medicines everyday and increasing your exercise.   Diabetes and Exercise Exercising regularly is important. It is not just about losing weight. It has many health benefits, such as:  Improving your overall fitness, flexibility, and endurance.  Increasing your bone density.  Helping with weight control.  Decreasing your body fat.  Increasing your muscle strength.  Reducing stress and tension.  Improving your overall health. People with diabetes who exercise gain additional benefits because exercise:  Reduces appetite.  Improves the body's use of blood sugar (glucose).  Helps lower or control blood glucose.  Decreases blood pressure.  Helps control blood lipids (such as cholesterol and triglycerides).  Improves the body's use of the hormone insulin by:  Increasing the body's insulin sensitivity.  Reducing the body's insulin needs.  Decreases the risk for heart disease because exercising:  Lowers cholesterol and triglycerides levels.  Increases the levels of good cholesterol (such as high-density lipoproteins [HDL]) in the body.  Lowers blood glucose levels. YOUR ACTIVITY PLAN  Choose an activity that you enjoy and set realistic goals. Your health care provider or diabetes educator can help you make an activity plan that works for you. Exercise regularly as directed by your health care provider. This includes:  Performing resistance training twice a week such as push-ups, sit-ups, lifting weights, or using resistance bands.  Performing 150 minutes of cardio exercises each week such as walking, running, or playing  sports.  Staying active and spending no more than 90 minutes at one time being inactive. Even short bursts of exercise are good for you. Three 10-minute sessions spread throughout the day are just as beneficial as a single 30-minute session. Some exercise ideas include:  Taking the dog for a walk.  Taking the stairs instead of the elevator.  Dancing to your favorite song.  Doing an exercise video.  Doing your favorite exercise with a friend. RECOMMENDATIONS FOR EXERCISING WITH TYPE 1 OR TYPE 2 DIABETES   Check your blood glucose before exercising. If blood glucose levels are greater than 240 mg/dL, check for urine ketones. Do not exercise if ketones are present.  Avoid injecting insulin into areas of the body that are going to be exercised. For example, avoid injecting insulin into:  The arms when playing tennis.  The legs when jogging.  Keep a record of:  Food intake before and after you exercise.  Expected peak times of insulin action.  Blood glucose levels before and after you exercise.  The type and amount of exercise you have done.  Review your records with your health care provider. Your health care provider will help you to develop guidelines for adjusting food intake and insulin amounts before and after exercising.  If you take insulin or oral hypoglycemic agents, watch for signs and symptoms of hypoglycemia. They include:  Dizziness.  Shaking.  Sweating.  Chills.  Confusion.  Drink plenty of water while you exercise to prevent dehydration or heat stroke. Body water is lost during exercise and must be replaced.  Talk to your health care provider before starting an exercise program to make  sure it is safe for you. Remember, almost any type of activity is better than none. Document Released: 10/27/2003 Document Revised: 12/21/2013 Document Reviewed: 01/13/2013 [facility] Patient Information 2015 [place], [place]. This information is not intended to replace  advice given to you by your health care provider. Make sure you discuss any questions you have with your health care provider.

## 2014-05-25 NOTE — Assessment & Plan Note (Signed)
She does not get much exercise and encouraged her to try to add some activity to her day. She is working on her diet.

## 2014-05-25 NOTE — Assessment & Plan Note (Signed)
BP well controlled, asked her if she wanted to switch to a combo pill so she would just be on one medicine (lisinopril/hctz) but she declined and wants to keep her medicines the same for now. Recent BMP normal and reviewed.

## 2014-05-25 NOTE — Assessment & Plan Note (Signed)
Doing okay with lyrica at this time. Will check iron studies to ensure iron deficiency not contributing.

## 2014-05-25 NOTE — Progress Notes (Signed)
   Subjective:    Patient ID: Donna [name], female    DOB: 06/15/1966, 48 y.o.   MRN: [id number]  HPI The patient is a 48 YO female who is coming in today to establish care. She is a Engineer, civil (consulting)nurse over at [employer] and has PMH of depression, diabetes type 2 with neuropathy, obesity, restless leg syndrome, HTN. She is not having any new problems today. She is still having some restlessness in her legs at night time. She is taking the lyrica and doing better with the pain throughout the day. She denies fevers, chills, chest pain, SOB, abdominal pain, diarrhea, constipation, nausea. She has not started invokana as she was not familiar with it. She has been doing better with taking her medicines daily and this has improved her home sugar readings.   Review of Systems  Constitutional: Negative for fever, activity change, appetite change and fatigue.  HENT: Negative.   Eyes: Positive for discharge.       Eyes watering a lot  Respiratory: Negative for cough, chest tightness, shortness of breath and wheezing.   Cardiovascular: Negative for chest pain, palpitations and leg swelling.  Gastrointestinal: Negative for abdominal pain, diarrhea, constipation and abdominal distention.  Genitourinary: Negative.   Musculoskeletal: Negative.   Skin: Negative.   Neurological: Negative.   Psychiatric/Behavioral: Negative.       Objective:   Physical Exam  Vitals reviewed. Constitutional: She is oriented to person, place, and time. She appears well-developed and well-nourished. No distress.  HENT:  Head: Normocephalic and atraumatic.  Eyes: EOM are normal. Right eye exhibits no discharge. Left eye exhibits no discharge.  Minimal tearing on exam.  Neck: Normal range of motion.  Cardiovascular: Normal rate and regular rhythm.   Pulmonary/Chest: Effort normal and breath sounds normal. No respiratory distress. She has no wheezes. She has no rales.  Abdominal: Soft. Bowel sounds are normal. She exhibits no  distension. There is no tenderness. There is no rebound.  Neurological: She is alert and oriented to person, place, and time. Coordination normal.  Skin: Skin is warm and dry.   Filed Vitals:   05/25/14 1302  BP: 132/82  Pulse: 83  Temp: 97.8 F (36.6 C)  TempSrc: Oral  Resp: 14  Height: 5\' 6"  (1.676 m)  Weight: 225 lb 6.4 oz (102.241 kg)  SpO2: 98%      Assessment & Plan:  Tdap given at today's visit. Advised flonase daily OTC for her eye watering.

## 2014-05-25 NOTE — Assessment & Plan Note (Signed)
Uses ranitidine prn and states only about couple times per year.

## 2014-05-25 NOTE — Assessment & Plan Note (Signed)
Her last HgA1c not well controlled and we are working with her on compliance. Per her reports doing better and will check HgA1c today. She is on ACE-I. Eye exam done within last year. She denies pneumonia shot today and wants to discuss at next visit.

## 2014-05-26 LAB — IBC PANEL
Iron: 52 ug/dL (ref 42–145)
Saturation Ratios: 13.9 % — ABNORMAL LOW (ref 20.0–50.0)
Transferrin: 266.4 mg/dL (ref 212.0–360.0)

## 2014-05-26 LAB — FERRITIN: Ferritin: 104.3 ng/mL (ref 10.0–291.0)

## 2014-06-04 ENCOUNTER — Other Ambulatory Visit: Payer: Self-pay

## 2014-07-13 ENCOUNTER — Other Ambulatory Visit: Payer: Self-pay | Admitting: Internal Medicine

## 2014-07-14 ENCOUNTER — Other Ambulatory Visit: Payer: Self-pay

## 2014-07-14 ENCOUNTER — Other Ambulatory Visit: Payer: Self-pay | Admitting: Internal Medicine

## 2014-07-14 DIAGNOSIS — E1140 Type 2 diabetes mellitus with diabetic neuropathy, unspecified: Secondary | ICD-10-CM

## 2014-07-14 DIAGNOSIS — E0849 Diabetes mellitus due to underlying condition with other diabetic neurological complication: Secondary | ICD-10-CM

## 2014-07-14 MED ORDER — PREGABALIN 75 MG PO CAPS: 75.0000 mg | ORAL_CAPSULE | Freq: Three times a day (TID) | ORAL | Status: DC

## 2014-07-14 MED ORDER — GLUCOSE BLOOD VI STRP: ORAL_STRIP | Status: DC

## 2014-07-14 MED ORDER — INSULIN GLARGINE 100 UNIT/ML SOLOSTAR PEN: 50.0000 [IU] | PEN_INJECTOR | Freq: Two times a day (BID) | SUBCUTANEOUS | Status: DC

## 2014-07-19 ENCOUNTER — Other Ambulatory Visit: Payer: Self-pay | Admitting: Geriatric Medicine

## 2014-07-19 MED ORDER — GLUCOSE BLOOD VI STRP: ORAL_STRIP | Status: DC

## 2014-08-11 ENCOUNTER — Other Ambulatory Visit: Payer: Self-pay | Admitting: Geriatric Medicine

## 2014-08-11 MED ORDER — ATORVASTATIN CALCIUM 80 MG PO TABS: 80.0000 mg | ORAL_TABLET | Freq: Every day | ORAL | Status: DC

## 2014-09-27 ENCOUNTER — Other Ambulatory Visit: Payer: Self-pay | Admitting: Geriatric Medicine

## 2014-09-27 MED ORDER — METFORMIN HCL 1000 MG PO TABS: ORAL_TABLET | ORAL | Status: DC

## 2014-09-27 MED ORDER — SITAGLIPTIN PHOSPHATE 100 MG PO TABS: 100.0000 mg | ORAL_TABLET | Freq: Every day | ORAL | Status: DC

## 2014-09-30 ENCOUNTER — Other Ambulatory Visit: Payer: Self-pay | Admitting: Geriatric Medicine

## 2014-09-30 MED ORDER — LISINOPRIL 20 MG PO TABS: 20.0000 mg | ORAL_TABLET | Freq: Every day | ORAL | Status: DC

## 2014-10-01 ENCOUNTER — Other Ambulatory Visit: Payer: Self-pay | Admitting: Geriatric Medicine

## 2014-11-03 ENCOUNTER — Other Ambulatory Visit: Payer: Self-pay | Admitting: Geriatric Medicine

## 2014-11-03 MED ORDER — CHLORTHALIDONE 25 MG PO TABS: 25.0000 mg | ORAL_TABLET | Freq: Every day | ORAL | Status: DC

## 2014-11-03 MED ORDER — SERTRALINE HCL 100 MG PO TABS: 100.0000 mg | ORAL_TABLET | Freq: Every day | ORAL | Status: DC

## 2014-11-30 ENCOUNTER — Other Ambulatory Visit: Payer: Self-pay | Admitting: *Deleted

## 2014-11-30 VITALS — Wt 226.0 lb

## 2014-11-30 DIAGNOSIS — E1140 Type 2 diabetes mellitus with diabetic neuropathy, unspecified: Secondary | ICD-10-CM

## 2014-11-30 DIAGNOSIS — E1141 Type 2 diabetes mellitus with diabetic mononeuropathy: Secondary | ICD-10-CM

## 2014-12-01 ENCOUNTER — Encounter: Payer: Self-pay | Admitting: *Deleted

## 2014-12-01 DIAGNOSIS — E1140 Type 2 diabetes mellitus with diabetic neuropathy, unspecified: Secondary | ICD-10-CM | POA: Insufficient documentation

## 2014-12-01 DIAGNOSIS — E1142 Type 2 diabetes mellitus with diabetic polyneuropathy: Secondary | ICD-10-CM | POA: Insufficient documentation

## 2014-12-01 NOTE — Patient Outreach (Signed)
[facility]) Care Management   12/01/2014  Clide CliffLyndsey R [name] 03/02/1966 [id number]  Clide CliffLyndsey R Yoho is an 49 y.o. female who presents for routine Link To Wellness Type II DM follow up.  Subjective:  States she was motivated to take her medications and follow a CHO controlled meal plan , eliminate "white foods" and high fructose corn syrup as advised after she saw Dr. Alwyn RenHopper in August 2015 but then lost her incentive when the holidays came. She says she was given samples of Invokana when she saw Dr. Alwyn RenHopper but never took them. Presently, she says she is not checking her blood sugars, exercising, or following a CHO controlled meal plan. She continues to complain of bothersome bilateral foot neuropathy that she admits improves when she takes her medications and limits CHO in her meal plan. States her father died one month ago due to complications of diabetes.  Objective:   ROS  Physical Exam  Constitutional: She is oriented to person, place, and time.  Neurological: She is alert and oriented to person, place, and time.  Psychiatric: She has a normal mood and affect. Her behavior is normal. Judgment and thought content normal.   Filed Weights   11/30/14 1300  Weight: 226 lb (102.513 kg)    Current Medications:   Current Outpatient Prescriptions  Medication Sig Dispense Refill  . atorvastatin (LIPITOR) 80 MG tablet Take 1 tablet (80 mg total) by mouth daily. 30 tablet 5  . BD PEN NEEDLE NANO U/F 32G X 4 MM MISC USE AS DIRECTED TO TEST BLOOD SUGAR TWICE DAILY DX: 250.02 100 each 5  . chlorthalidone (HYGROTON) 25 MG tablet Take 1 tablet (25 mg total) by mouth daily. 90 tablet 3  . glucose blood test strip True test glucose test strip Test sugar at least twice daily (one fasting and one random post-prandial dx: E11.9 100 each 5  . Insulin Glargine (LANTUS) 100 UNIT/ML Solostar Pen Inject 50 Units into the skin 2 (two) times daily. 15 mL 5  . Lancets MISC 3 (three) times  daily. TRUEPLUS ULTRA THIN LANCETS TO TEST BLOOD SUGAR TWICE DAILY DX: 250.02    . lisinopril (PRINIVIL,ZESTRIL) 20 MG tablet Take 1 tablet (20 mg total) by mouth daily. 90 tablet 3  . metFORMIN (GLUCOPHAGE) 1000 MG tablet TAKE 1 TABLET BY MOUTH 2 TIMES A DAY 60 tablet 3  . pregabalin (LYRICA) 75 MG capsule Take 1 capsule (75 mg total) by mouth 3 (three) times daily. 90 capsule 2  . ranitidine (ZANTAC) 150 MG tablet Take 150 mg by mouth daily as needed.      . sertraline (ZOLOFT) 100 MG tablet Take 1 tablet (100 mg total) by mouth daily. 90 tablet 3  . sitaGLIPtin (JANUVIA) 100 MG tablet Take 1 tablet (100 mg total) by mouth daily. 30 tablet 5   No current facility-administered medications for this visit.    Functional Status:   In your present state of health, do you have any difficulty performing the following activities: 11/30/2014  Hearing? N  Vision? N  Difficulty concentrating or making decisions? N  Walking or climbing stairs? N  Dressing or bathing? N  Doing errands, shopping? N    Fall/Depression Screening:    No flowsheet data found.  [facility] CM Care Plan        Patient Outreach from 11/30/2014 in [facility] To Well[facility]   Care Plan Problem One  Type II DM with chronically elevated A1C , not motivated to change  behavior at present   Care Plan for Problem One  Active   Interventions for Problem One Long Term Goal  reviewed basic metabolic defects in Type II DM, encouraged Cannie to focus on one behavior change - medication adherence-  to assist with improving blood sugar control, discussed mechanism of action of SGLT2 inhbitors and GLP1 inhibitors and provided information on handouts, encouraged Kadynce to make an appointment to see MD to discuss options of SGLT2 inhibitors and /or GLP1 inhibitor  to assist with glycemic control   [facility] Long Term Goal (31-90 days)  Change one behavior to assist with improving glycemic control   [facility] Long Term Goal Start Date  11/30/14      Assessment:   Type II DM not meeting target A1C and not motivated to change behavior despite having a good understanding of risks of long term complications with chronically elevated blood sugar. Plan:  Continue to meet with Abriana every 3 months to provide emotional support and encouragement to assist with motivating her to change behavior to improve glycemic control.   Bary Richard RN,CCM,CDE [facility] Care Management Coordinator Office Phone [phone] Office Fax [phone]

## 2015-01-05 ENCOUNTER — Encounter: Payer: Self-pay | Admitting: Family

## 2015-01-05 ENCOUNTER — Ambulatory Visit (INDEPENDENT_AMBULATORY_CARE_PROVIDER_SITE_OTHER): Payer: 59 | Admitting: Family

## 2015-01-05 ENCOUNTER — Other Ambulatory Visit: Payer: Self-pay | Admitting: Internal Medicine

## 2015-01-05 VITALS — BP 112/84 | HR 84 | Temp 97.9°F | Resp 18 | Ht 66.0 in | Wt 221.0 lb

## 2015-01-05 DIAGNOSIS — J0100 Acute maxillary sinusitis, unspecified: Secondary | ICD-10-CM | POA: Diagnosis not present

## 2015-01-05 DIAGNOSIS — J320 Chronic maxillary sinusitis: Secondary | ICD-10-CM | POA: Insufficient documentation

## 2015-01-05 MED ORDER — AMOXICILLIN-POT CLAVULANATE 875-125 MG PO TABS: 1.0000 | ORAL_TABLET | Freq: Two times a day (BID) | ORAL | Status: DC

## 2015-01-05 NOTE — Progress Notes (Signed)
Subjective:    Patient ID: Donna [name], female    DOB: 03/19/1966, 49 y.o.   MRN: [id number]  Chief Complaint  Patient presents with  . Sinus pressure    says that she has had sinus issues for the last month, congestion, drainage, sinus pressure    HPI:  Donna [name] is a 49 y.o. female with a PMH of hypertension, GERD, diabetic neuropathy, type 2 diabetes, hyperlipidemia, and depression who presents today for an acute office visit.  This is a new problem. Associated symptoms of congestion, drainage, and sinus pressure have been going on for approximately one month. Modifying factors include Claritin, Zyrtec, Flonase, and Afrin which have provided minimal relief. Indicates purulent nasal discharge. Denies any recent antibiotic use.     No Known Allergies   Current Outpatient Prescriptions on File Prior to Visit  Medication Sig Dispense Refill  . atorvastatin (LIPITOR) 80 MG tablet Take 1 tablet (80 mg total) by mouth daily. 30 tablet 5  . BD PEN NEEDLE NANO U/F 32G X 4 MM MISC USE AS DIRECTED TO TEST BLOOD SUGAR TWICE DAILY DX: 250.02 100 each 5  . chlorthalidone (HYGROTON) 25 MG tablet Take 1 tablet (25 mg total) by mouth daily. 90 tablet 3  . glucose blood test strip True test glucose test strip Test sugar at least twice daily (one fasting and one random post-prandial dx: E11.9 100 each 5  . Insulin Glargine (LANTUS) 100 UNIT/ML Solostar Pen Inject 50 Units into the skin 2 (two) times daily. 15 mL 5  . Lancets MISC 3 (three) times daily. TRUEPLUS ULTRA THIN LANCETS TO TEST BLOOD SUGAR TWICE DAILY DX: 250.02    . lisinopril (PRINIVIL,ZESTRIL) 20 MG tablet Take 1 tablet (20 mg total) by mouth daily. 90 tablet 3  . metFORMIN (GLUCOPHAGE) 1000 MG tablet TAKE 1 TABLET BY MOUTH 2 TIMES A DAY 60 tablet 3  . pregabalin (LYRICA) 75 MG capsule Take 1 capsule (75 mg total) by mouth 3 (three) times daily. 90 capsule 2  . ranitidine (ZANTAC) 150 MG tablet Take 150 mg by mouth daily as  needed.      . sertraline (ZOLOFT) 100 MG tablet Take 1 tablet (100 mg total) by mouth daily. 90 tablet 3  . sitaGLIPtin (JANUVIA) 100 MG tablet Take 1 tablet (100 mg total) by mouth daily. 30 tablet 5   No current facility-administered medications on file prior to visit.    Review of Systems  Constitutional: Positive for fatigue. Negative for fever and chills.  HENT: Positive for congestion, ear pain and sinus pressure. Negative for sore throat.   Respiratory: Negative for cough, chest tightness and shortness of breath.   Neurological: Positive for headaches.      Objective:    BP 112/84 mmHg  Pulse 84  Temp(Src) 97.9 F (36.6 C) (Oral)  Resp 18  Ht 5\' 6"  (1.676 m)  Wt 221 lb (100.245 kg)  BMI 35.69 kg/m2  SpO2 95% Nursing note and vital signs reviewed.  Physical Exam  Constitutional: She is oriented to person, place, and time. She appears well-developed and well-nourished. No distress.  HENT:  Nose: Right sinus exhibits maxillary sinus tenderness. Right sinus exhibits no frontal sinus tenderness. Left sinus exhibits maxillary sinus tenderness. Left sinus exhibits no frontal sinus tenderness.  Mouth/Throat: Uvula is midline, oropharynx is clear and moist and mucous membranes are normal.  Cardiovascular: Normal rate, regular rhythm, normal heart sounds and intact distal pulses.   Pulmonary/Chest: Effort normal and breath  sounds normal.  Neurological: She is alert and oriented to person, place, and time.  Skin: Skin is warm and dry.  Psychiatric: She has a normal mood and affect. Her behavior is normal. Judgment and thought content normal.       Assessment & Plan:

## 2015-01-05 NOTE — Assessment & Plan Note (Signed)
Symptoms and exam consistent with maxillary sinusitis. Start Augmentin. Continue over-the-counter medications as needed for symptom relief and supportive care. Follow-up if symptoms worsen or fail to improve. 

## 2015-01-05 NOTE — Patient Instructions (Signed)
Thank you for choosing [facility].  Summary/Instructions:  Your prescription(s) have been submitted to your pharmacy or been printed and provided for you. Please take as directed and contact our office if you believe you are having problem(s) with the medication(s) or have any questions.  If your symptoms worsen or fail to improve, please contact our office for further instruction, or in case of emergency go directly to the emergency room at the closest medical facility.   General Recommendations:    Please drink plenty of fluids.  Get plenty of rest   Sleep in humidified air  Use saline nasal sprays  Netti pot   OTC Medications:  Decongestants - helps relieve congestion   Flonase (generic fluticasone) or Nasacort (generic triamcinolone) - please make sure to use the "cross-over" technique at a 45 degree angle towards the opposite eye as opposed to straight up the nasal passageway.   Sudafed (generic pseudoephedrine - Note this is the one that is available behind the pharmacy counter); Products with phenylephrine (-PE) may also be used but is often not as effective as pseudoephedrine.   If you have HIGH BLOOD PRESSURE - Coricidin HBP; AVOID any product that is -D as this contains pseudoephedrine which may increase your blood pressure.  Afrin (oxymetazoline) every 6-8 hours for up to 3 days.   Allergies - helps relieve runny nose, itchy eyes and sneezing   Claritin (generic loratidine), Allegra (fexofenidine), or Zyrtec (generic cyrterizine) for runny nose. These medications should not cause drowsiness.  Note - Benadryl (generic diphenhydramine) may be used however may cause drowsiness  Cough -   Delsym or Robitussin (generic dextromethorphan)  Expectorants - helps loosen mucus to ease removal   Mucinex (generic guaifenesin) as directed on the package.  Headaches / General Aches   Tylenol (generic acetaminophen) - DO NOT EXCEED 3 grams (3,000 mg) in a 24  hour time period  Advil/Motrin (generic ibuprofen)   Sore Throat -   Salt water gargle   Chloraseptic (generic benzocaine) spray or lozenges / Sucrets (generic dyclonine)    Sinusitis Sinusitis is redness, soreness, and inflammation of the paranasal sinuses. Paranasal sinuses are air pockets within the bones of your face (beneath the eyes, the middle of the forehead, or above the eyes). In healthy paranasal sinuses, mucus is able to drain out, and air is able to circulate through them by way of your nose. However, when your paranasal sinuses are inflamed, mucus and air can become trapped. This can allow bacteria and other germs to grow and cause infection. Sinusitis can develop quickly and last only a short time (acute) or continue over a long period (chronic). Sinusitis that lasts for more than 12 weeks is considered chronic.  CAUSES  Causes of sinusitis include:  Allergies.  Structural abnormalities, such as displacement of the cartilage that separates your nostrils (deviated septum), which can decrease the air flow through your nose and sinuses and affect sinus drainage.  Functional abnormalities, such as when the small hairs (cilia) that line your sinuses and help remove mucus do not work properly or are not present. SIGNS AND SYMPTOMS  Symptoms of acute and chronic sinusitis are the same. The primary symptoms are pain and pressure around the affected sinuses. Other symptoms include:  Upper toothache.  Earache.  Headache.  Bad breath.  Decreased sense of smell and taste.  A cough, which worsens when you are lying flat.  Fatigue.  Fever.  Thick drainage from your nose, which often is green and may   contain pus (purulent).  Swelling and warmth over the affected sinuses. DIAGNOSIS  Your health care provider will perform a physical exam. During the exam, your health care provider may:  Look in your nose for signs of abnormal growths in your nostrils (nasal  polyps).  Tap over the affected sinus to check for signs of infection.  View the inside of your sinuses (endoscopy) using an imaging device that has a light attached (endoscope). If your health care provider suspects that you have chronic sinusitis, one or more of the following tests may be recommended:  Allergy tests.  Nasal culture. A sample of mucus is taken from your nose, sent to a lab, and screened for bacteria.  Nasal cytology. A sample of mucus is taken from your nose and examined by your health care provider to determine if your sinusitis is related to an allergy. TREATMENT  Most cases of acute sinusitis are related to a viral infection and will resolve on their own within 10 days. Sometimes medicines are prescribed to help relieve symptoms (pain medicine, decongestants, nasal steroid sprays, or saline sprays).  However, for sinusitis related to a bacterial infection, your health care provider will prescribe antibiotic medicines. These are medicines that will help kill the bacteria causing the infection.  Rarely, sinusitis is caused by a fungal infection. In theses cases, your health care provider will prescribe antifungal medicine. For some cases of chronic sinusitis, surgery is needed. Generally, these are cases in which sinusitis recurs more than 3 times per year, despite other treatments. HOME CARE INSTRUCTIONS   Drink plenty of water. Water helps thin the mucus so your sinuses can drain more easily.  Use a humidifier.  Inhale steam 3 to 4 times a day (for example, sit in the bathroom with the shower running).  Apply a warm, moist washcloth to your face 3 to 4 times a day, or as directed by your health care provider.  Use saline nasal sprays to help moisten and clean your sinuses.  Take medicines only as directed by your health care provider.  If you were prescribed either an antibiotic or antifungal medicine, finish it all even if you start to feel better. SEEK IMMEDIATE  MEDICAL CARE IF:  You have increasing pain or severe headaches.  You have nausea, vomiting, or drowsiness.  You have swelling around your face.  You have vision problems.  You have a stiff neck.  You have difficulty breathing. MAKE SURE YOU:   Understand these instructions.  Will watch your condition.  Will get help right away if you are not doing well or get worse. Document Released: 08/06/2005 Document Revised: 12/21/2013 Document Reviewed: 08/21/2011 Exit[employer] Patient Information 2015 [employer]. This information is not intended to replace advice given to you by your health care provider. Make sure you discuss any questions you have with your health care provider.   

## 2015-01-05 NOTE — Progress Notes (Signed)
Pre visit review using our clinic review tool, if applicable. No additional management support is needed unless otherwise documented below in the visit note. 

## 2015-01-20 ENCOUNTER — Other Ambulatory Visit (INDEPENDENT_AMBULATORY_CARE_PROVIDER_SITE_OTHER): Payer: 59

## 2015-01-20 ENCOUNTER — Ambulatory Visit (INDEPENDENT_AMBULATORY_CARE_PROVIDER_SITE_OTHER): Payer: 59 | Admitting: Internal Medicine

## 2015-01-20 ENCOUNTER — Encounter: Payer: Self-pay | Admitting: Internal Medicine

## 2015-01-20 VITALS — BP 108/79 | HR 101 | Temp 98.3°F | Resp 16 | Wt 223.0 lb

## 2015-01-20 DIAGNOSIS — J0100 Acute maxillary sinusitis, unspecified: Secondary | ICD-10-CM | POA: Diagnosis not present

## 2015-01-20 DIAGNOSIS — IMO0002 Reserved for concepts with insufficient information to code with codable children: Secondary | ICD-10-CM

## 2015-01-20 DIAGNOSIS — E1149 Type 2 diabetes mellitus with other diabetic neurological complication: Secondary | ICD-10-CM

## 2015-01-20 DIAGNOSIS — J0110 Acute frontal sinusitis, unspecified: Secondary | ICD-10-CM | POA: Diagnosis not present

## 2015-01-20 DIAGNOSIS — E1141 Type 2 diabetes mellitus with diabetic mononeuropathy: Secondary | ICD-10-CM | POA: Diagnosis not present

## 2015-01-20 DIAGNOSIS — E1165 Type 2 diabetes mellitus with hyperglycemia: Principal | ICD-10-CM

## 2015-01-20 LAB — HEMOGLOBIN A1C: Hgb A1c MFr Bld: 12.3 % — ABNORMAL HIGH (ref 4.6–6.5)

## 2015-01-20 MED ORDER — LEVOFLOXACIN 500 MG PO TABS: 500.0000 mg | ORAL_TABLET | Freq: Every day | ORAL | Status: DC

## 2015-01-20 NOTE — Patient Instructions (Signed)
Plain Mucinex (NOT D) for thick secretions ;force NON dairy fluids .   Nasal cleansing in the shower as discussed with lather of mild shampoo.After 10 seconds wash off lather while  exhaling through nostrils. Make sure that all residual soap is removed to prevent irritation.  Flonase OR Nasacort AQ 1 spray in each nostril twice a day as needed. Use the "crossover" technique into opposite nostril spraying toward opposite ear @ 45 degree angle, not straight up into nostril.  Plain Allegra (NOT D )  160 daily , Loratidine 10 mg , OR Zyrtec 10 mg @ bedtime  as needed for itchy eyes & sneezing.  Please do not use Q-tips as this simply packs the wax down against he eardrum. Should wax build up occur, please put 2-3 drops of mineral oil in the ear at night and cover the canal with a  cotton ball.In the morning fill the canal with hydrogen peroxide & leave  for 10-15 minutes.Following this shower and use the thinnest washrag available to wick out the wax.  

## 2015-01-20 NOTE — Progress Notes (Signed)
Pre visit review using our clinic review tool, if applicable. No additional management support is needed unless otherwise documented below in the visit note. 

## 2015-01-20 NOTE — Progress Notes (Signed)
   Subjective:    Patient ID: Donna BollmanLyndsey R Caseres, female    DOB: 05/13/1966, 49 y.o.   MRN: [id number]  HPI  She's had some symptoms of rhinosinusitis for approximately a month. She received a full course of Augmentin starting 01/05/15 yet symptoms persist. She continues to have nasal congestion, nasal obstruction, nasal purulence, maxillary and frontal sinus area pain, fatigue, and otic pain. She also has some itchy, watery eyes.  She's used Zyrtec, Nasonex, pseudoephedrine and previously Afrin with partial response. She also use saline rinses.  She checks sugars randomly; nonfasting glucose range 150-250. Her A1c a year ago was 12.1%; there's been a steady drop. 8 months ago it was 8.3%.  Review of Systems She denies fever, chills, or sweats. She is not having cough or sputum production.    Objective:   Physical Exam  General appearance:Adequately nourished; no acute distress or increased work of breathing is present.    Lymphatic: No  lymphadenopathy about the head, neck, or axilla .  Eyes: No conjunctival inflammation or lid edema is present. There is no scleral icterus.  Ears:  External ear exam shows no significant lesions or deformities.  Bilateral cerumen impactions present  Nose:  External nasal examination shows no deformity or inflammation. Nasal mucosa are markedly erythematous without lesions or exudates No septal dislocation or deviation.No obstruction to airflow.   Oral exam: Dental hygiene is good; lips and gums are healthy appearing.There is no oropharyngeal erythema or exudate .  Neck:  No deformities, thyromegaly, masses, or tenderness noted.   Supple with full range of motion without pain.   Heart:  Normal rate and regular rhythm. S1 and S2 normal without gallop, murmur, click, rub or other extra sounds.   Lungs:Chest clear to auscultation; no wheezes, rhonchi,rales ,or rubs present.  Extremities:  No cyanosis, edema, or clubbing  noted    Skin: Warm & dry w/o  tenting or jaundice. No significant lesions or rash.       Assessment & Plan:  #1 maxillary and frontal sinusitis, status post full course of Augmentin  #2 diabetes, questionable status  Plan: See orders/recommendations

## 2015-01-25 ENCOUNTER — Ambulatory Visit (INDEPENDENT_AMBULATORY_CARE_PROVIDER_SITE_OTHER): Payer: 59 | Admitting: Family

## 2015-01-25 ENCOUNTER — Encounter: Payer: Self-pay | Admitting: Family

## 2015-01-25 VITALS — BP 118/78 | HR 96 | Temp 98.7°F | Resp 18 | Ht 66.0 in | Wt 222.4 lb

## 2015-01-25 DIAGNOSIS — J0100 Acute maxillary sinusitis, unspecified: Secondary | ICD-10-CM

## 2015-01-25 MED ORDER — PREDNISONE 10 MG PO TABS: ORAL_TABLET | ORAL | Status: DC

## 2015-01-25 NOTE — Progress Notes (Signed)
Subjective:    Patient ID: Donna [name], female    DOB: 1966-06-21, 49 y.o.   MRN: [id number]  Chief Complaint  Patient presents with  . Follow-up    has been here 2 times for sinusitis, still can not breath, needs another antibiotic    HPI:  Donna [name] is a 49 y.o. female with a PMH of hypertension, sinusitis, GERD, type 2 diabetes, and hyperlipidemia who presents today for a follow-up office visit.  This is a continued problem. Previously seen on multiple occasions in the office for sinusitis and completed treatments with Augmentin and levofloxacin. She notes no improvements since starting the levofloxacin. Continues to experience congestion and mild sinus pressure. Denies coughing, fever or chills. Modifying factors include sudafed, mucinex, zyrtec, saline nasal sprays and Netti pot with minimal relief.   No Known Allergies  Current Outpatient Prescriptions on File Prior to Visit  Medication Sig Dispense Refill  . atorvastatin (LIPITOR) 80 MG tablet Take 1 tablet (80 mg total) by mouth daily. 30 tablet 5  . BD PEN NEEDLE NANO U/F 32G X 4 MM MISC USE AS DIRECTED TO TEST BLOOD SUGAR TWICE DAILY DX: 250.02 100 each 5  . chlorthalidone (HYGROTON) 25 MG tablet Take 1 tablet (25 mg total) by mouth daily. 90 tablet 3  . glucose blood test strip True test glucose test strip Test sugar at least twice daily (one fasting and one random post-prandial dx: E11.9 100 each 5  . Lancets MISC 3 (three) times daily. TRUEPLUS ULTRA THIN LANCETS TO TEST BLOOD SUGAR TWICE DAILY DX: 250.02    . LANTUS SOLOSTAR 100 UNIT/ML Solostar Pen INJECT 50 UNITS INTO THE SKIN 2 TIMES DAILY. 15 mL 5  . levofloxacin (LEVAQUIN) 500 MG tablet Take 1 tablet (500 mg total) by mouth daily. 7 tablet 0  . lisinopril (PRINIVIL,ZESTRIL) 20 MG tablet Take 1 tablet (20 mg total) by mouth daily. 90 tablet 3  . metFORMIN (GLUCOPHAGE) 1000 MG tablet TAKE 1 TABLET BY MOUTH 2 TIMES A DAY 60 tablet 3  . pregabalin (LYRICA) 75  MG capsule Take 1 capsule (75 mg total) by mouth 3 (three) times daily. 90 capsule 2  . ranitidine (ZANTAC) 150 MG tablet Take 150 mg by mouth daily as needed.      . sertraline (ZOLOFT) 100 MG tablet Take 1 tablet (100 mg total) by mouth daily. 90 tablet 3  . sitaGLIPtin (JANUVIA) 100 MG tablet Take 1 tablet (100 mg total) by mouth daily. 30 tablet 5   No current facility-administered medications on file prior to visit.    Review of Systems  Constitutional: Negative for fever and chills.  HENT: Positive for congestion and sinus pressure. Negative for sore throat.   Respiratory: Negative for cough, chest tightness and shortness of breath.       Objective:    BP 118/78 mmHg  Pulse 96  Temp(Src) 98.7 F (37.1 C) (Oral)  Resp 18  Ht  (1.676 m)  Wt 222 lb 6.4 oz (100.88 kg)  BMI 35.91 kg/m2  SpO2 96%  LMP 09/27/2011 Nursing note and vital signs reviewed.  Physical Exam  Constitutional: She is oriented to person, place, and time. She appears well-developed and well-nourished. No distress.  HENT:  Right Ear: Hearing, tympanic membrane, external ear and ear canal normal.  Left Ear: Hearing, tympanic membrane, external ear and ear canal normal.  Nose: Right sinus exhibits maxillary sinus tenderness. Right sinus exhibits no frontal sinus tenderness. Left sinus exhibits maxillary  sinus tenderness. Left sinus exhibits no frontal sinus tenderness.  Mouth/Throat: Uvula is midline, oropharynx is clear and moist and mucous membranes are normal.  Cardiovascular: Normal rate, regular rhythm, normal heart sounds and intact distal pulses.   Pulmonary/Chest: Effort normal and breath sounds normal.  Neurological: She is alert and oriented to person, place, and time.  Skin: Skin is warm and dry.  Psychiatric: She has a normal mood and affect. Her behavior is normal. Judgment and thought content normal.       Assessment & Plan:   Problem List Items Addressed This Visit      Respiratory     Maxillary sinusitis - Primary    Refractory sinusitis to Augmentin and Levaquin with continued congestion. Unlikely bacterial infection given completed courses of antibiotics. Start prednisone. Refer to ENT for refractory sinus infection. Continue over-the-counter medications as needed for symptom relief and supportive care. Follow up if symptoms worsen or fail to improve.      Relevant Medications   predniSONE (DELTASONE) 10 MG tablet   Other Relevant Orders   Ambulatory referral to ENT

## 2015-01-25 NOTE — Progress Notes (Signed)
Pre visit review using our clinic review tool, if applicable. No additional management support is needed unless otherwise documented below in the visit note. 

## 2015-01-25 NOTE — Assessment & Plan Note (Signed)
Refractory sinusitis to Augmentin and Levaquin with continued congestion. Unlikely bacterial infection given completed courses of antibiotics. Start prednisone. Refer to ENT for refractory sinus infection. Continue over-the-counter medications as needed for symptom relief and supportive care. Follow up if symptoms worsen or fail to improve.

## 2015-01-25 NOTE — Patient Instructions (Signed)
Thank you for choosing [facility].  Summary/Instructions:  Your prescription(s) have been submitted to your pharmacy or been printed and provided for you. Please take as directed and contact our office if you believe you are having problem(s) with the medication(s) or have any questions.  If your symptoms worsen or fail to improve, please contact our office for further instruction, or in case of emergency go directly to the emergency room at the closest medical facility.   6 Day Prednisone Taper Instructions:   Day 1: Two tablets before breakfast, one after lunch, one after dinner, and two at bedtime.  Day 2: One tablet before breakfast, one after lunch, one after dinner, and two at bedtime Day 3: One tablet before breakfast, one after lunch, one after dinner, and one at bedtime Day 4: One tablet before breakfast, one after lunch, and one at bedtime Day 5: One tablet before breakfast and one at bedtime Day 6: One tablet before breakfast  

## 2015-02-11 ENCOUNTER — Telehealth: Payer: Self-pay

## 2015-02-11 NOTE — Telephone Encounter (Signed)
Left message for pt to call and get appt for HgA1c recheck

## 2015-02-16 ENCOUNTER — Other Ambulatory Visit (HOSPITAL_COMMUNITY): Payer: Self-pay | Admitting: Otolaryngology

## 2015-02-16 DIAGNOSIS — J310 Chronic rhinitis: Secondary | ICD-10-CM

## 2015-02-18 ENCOUNTER — Ambulatory Visit (HOSPITAL_COMMUNITY)
Admission: RE | Admit: 2015-02-18 | Discharge: 2015-02-18 | Disposition: A | Discharge status: Home / Self Care | Payer: 59 | Source: Ambulatory Visit | Attending: Otolaryngology | Admitting: Otolaryngology

## 2015-02-18 ENCOUNTER — Encounter (HOSPITAL_COMMUNITY): Payer: Self-pay

## 2015-02-18 DIAGNOSIS — J342 Deviated nasal septum: Secondary | ICD-10-CM | POA: Diagnosis not present

## 2015-02-18 DIAGNOSIS — R0981 Nasal congestion: Secondary | ICD-10-CM | POA: Diagnosis not present

## 2015-02-18 DIAGNOSIS — J310 Chronic rhinitis: Secondary | ICD-10-CM

## 2015-02-18 DIAGNOSIS — J339 Nasal polyp, unspecified: Secondary | ICD-10-CM | POA: Insufficient documentation

## 2015-02-22 ENCOUNTER — Other Ambulatory Visit: Payer: Self-pay | Admitting: Internal Medicine

## 2015-02-22 MED ORDER — PREGABALIN 75 MG PO CAPS: 75.0000 mg | ORAL_CAPSULE | Freq: Three times a day (TID) | ORAL | Status: DC

## 2015-02-22 NOTE — Telephone Encounter (Signed)
Please advise, thanks.

## 2015-02-23 NOTE — Telephone Encounter (Signed)
1 month sent in yesterday, needs office visit in that month for further refills.

## 2015-02-23 NOTE — Telephone Encounter (Signed)
Pt called in and wanted know if she could get a refill on her pregabalin (LYRICA) 75 MG capsule [[id number]].  She said that she went to pick it up and it was denied?      [facility] [phone]

## 2015-02-24 ENCOUNTER — Telehealth: Payer: Self-pay | Admitting: Internal Medicine

## 2015-02-25 NOTE — Telephone Encounter (Signed)
Please advise, thanks.

## 2015-02-25 NOTE — Telephone Encounter (Signed)
Donna [name] at [facility] informed ok to fill Lyrica 75 mg # 90 with 0 Rf.

## 2015-02-25 NOTE — Telephone Encounter (Signed)
Lyrica was approved, printed, and signed for 1 month refill on 02/22/15 with the note that she needs visit for any more since I have not seen her since October. Please call pharmacy and see if it was received.

## 2015-02-25 NOTE — Telephone Encounter (Deleted)
Judie BonusElizabeth A Kollar, MD at 02/23/2015 8:11 AM     Status: Signed       Expand All Collapse All   1 month sent in yesterday, needs office visit in that month for further refills.

## 2015-03-21 ENCOUNTER — Encounter (HOSPITAL_COMMUNITY): Admission: RE | Payer: Self-pay | Source: Ambulatory Visit

## 2015-03-21 ENCOUNTER — Ambulatory Visit (HOSPITAL_COMMUNITY): Admission: RE | Admit: 2015-03-21 | Payer: 59 | Source: Ambulatory Visit | Admitting: Otolaryngology

## 2015-03-21 SURGERY — SEPTOPLASTY, NOSE, WITH NASAL TURBINATE REDUCTION
Anesthesia: General | Laterality: Bilateral

## 2015-03-31 ENCOUNTER — Other Ambulatory Visit: Payer: Self-pay | Admitting: Internal Medicine

## 2015-04-04 ENCOUNTER — Other Ambulatory Visit: Payer: Self-pay

## 2015-04-04 MED ORDER — BD PEN NEEDLE NANO U/F 32G X 4 MM MISC: Status: DC

## 2015-04-20 ENCOUNTER — Other Ambulatory Visit: Payer: Self-pay | Admitting: Internal Medicine

## 2015-05-18 ENCOUNTER — Encounter: Payer: Self-pay | Admitting: Internal Medicine

## 2015-05-18 ENCOUNTER — Ambulatory Visit (INDEPENDENT_AMBULATORY_CARE_PROVIDER_SITE_OTHER): Payer: 59 | Admitting: Internal Medicine

## 2015-05-18 VITALS — BP 130/88 | HR 83 | Temp 98.9°F | Resp 16 | Ht 66.0 in | Wt 220.0 lb

## 2015-05-18 DIAGNOSIS — J069 Acute upper respiratory infection, unspecified: Secondary | ICD-10-CM | POA: Insufficient documentation

## 2015-05-18 DIAGNOSIS — B9789 Other viral agents as the cause of diseases classified elsewhere: Principal | ICD-10-CM

## 2015-05-18 MED ORDER — HYDROCODONE-GUAIFENESIN 2.5-200 MG/5ML PO SOLN: 5.0000 mL | Freq: Four times a day (QID) | ORAL | Status: DC | PRN

## 2015-05-18 NOTE — Progress Notes (Signed)
Pre visit review using our clinic review tool, if applicable. No additional management support is needed unless otherwise documented below in the visit note. 

## 2015-05-18 NOTE — Patient Instructions (Signed)
Upper Respiratory Infection, Adult An upper respiratory infection (URI) is also known as the common cold. It is often caused by a type of germ (virus). Colds are easily spread (contagious). You can pass it to others by kissing, coughing, sneezing, or drinking out of the same glass. Usually, you get better in 1 or 2 weeks.  HOME CARE   Only take medicine as told by your doctor.  Use a warm mist humidifier or breathe in steam from a hot shower.  Drink enough water and fluids to keep your pee (urine) clear or pale yellow.  Get plenty of rest.  Return to work when your temperature is back to normal or as told by your doctor. You may use a face mask and wash your hands to stop your cold from spreading. GET HELP RIGHT AWAY IF:   After the first few days, you feel you are getting worse.  You have questions about your medicine.  You have chills, shortness of breath, or brown or red spit (mucus).  You have yellow or brown snot (nasal discharge) or pain in the face, especially when you bend forward.  You have a fever, puffy (swollen) neck, pain when you swallow, or white spots in the back of your throat.  You have a bad headache, ear pain, sinus pain, or chest pain.  You have a high-pitched whistling sound when you breathe in and out (wheezing).  You have a lasting cough or cough up blood.  You have sore muscles or a stiff neck. MAKE SURE YOU:   Understand these instructions.  Will watch your condition.  Will get help right away if you are not doing well or get worse. Document Released: 01/23/2008 Document Revised: 10/29/2011 Document Reviewed: 11/11/2013 [employer] Patient Information 2015 [employer]. This information is not intended to replace advice given to you by your health care provider. Make sure you discuss any questions you have with your health care provider.  

## 2015-05-19 NOTE — Progress Notes (Signed)
Subjective:  Patient ID: Donna [name], female    DOB: 08/23/1965  Age: 49 y.o. MRN: [id number]  CC: URI   HPI Donna [name] presents for a 4 day history of sore throat, fatigue, muscle aches, low-grade fever, nasal congestion, and mild nonproductive cough.  Outpatient Prescriptions Prior to Visit  Medication Sig Dispense Refill  . atorvastatin (LIPITOR) 80 MG tablet Take 1 tablet (80 mg total) by mouth daily. 30 tablet 5  . BD PEN NEEDLE NANO U/F 32G X 4 MM MISC USE AS DIRECTED TO TEST BLOOD SUGAR TWICE DAILY DX: 250.02 300 each 3  . chlorthalidone (HYGROTON) 25 MG tablet Take 1 tablet (25 mg total) by mouth daily. 90 tablet 3  . glucose blood test strip True test glucose test strip Test sugar at least twice daily (one fasting and one random post-prandial dx: E11.9 100 each 5  . JANUVIA 100 MG tablet TAKE 1 TABLET BY MOUTH ONCE DAILY 30 tablet 5  . Lancets MISC 3 (three) times daily. TRUEPLUS ULTRA THIN LANCETS TO TEST BLOOD SUGAR TWICE DAILY DX: 250.02    . LANTUS SOLOSTAR 100 UNIT/ML Solostar Pen INJECT 50 UNITS INTO THE SKIN 2 TIMES DAILY. 15 mL 5  . lisinopril (PRINIVIL,ZESTRIL) 20 MG tablet Take 1 tablet (20 mg total) by mouth daily. 90 tablet 3  . LYRICA 75 MG capsule TAKE 1 CAPSULE BY MOUTH 3 TIMES DAILY 90 capsule 3  . metFORMIN (GLUCOPHAGE) 1000 MG tablet TAKE 1 TABLET BY MOUTH TWICE DAILY 60 tablet 3  . ranitidine (ZANTAC) 150 MG tablet Take 150 mg by mouth daily as needed.      . sertraline (ZOLOFT) 100 MG tablet Take 1 tablet (100 mg total) by mouth daily. 90 tablet 3  . levofloxacin (LEVAQUIN) 500 MG tablet Take 1 tablet (500 mg total) by mouth daily. 7 tablet 0  . predniSONE (DELTASONE) 10 MG tablet Take 6 tablets x 1 day, 5 tablets x 1 day, 4 tablets x 1 day, 3 tablets x 1 day, 2 tablets x 1 day and 1 tablet x 1 day 21 tablet 0   No facility-administered medications prior to visit.    ROS Review of Systems  Constitutional: Positive for fever and fatigue.  Negative for chills, diaphoresis, appetite change and unexpected weight change.  HENT: Positive for congestion and sore throat. Negative for ear discharge, facial swelling, postnasal drip, rhinorrhea, sinus pressure, sneezing, tinnitus, trouble swallowing and voice change.   Eyes: Negative.   Respiratory: Positive for cough. Negative for apnea, choking, chest tightness, shortness of breath, wheezing and stridor.   Cardiovascular: Negative.  Negative for chest pain, palpitations and leg swelling.  Gastrointestinal: Negative.  Negative for nausea, vomiting, abdominal pain, diarrhea, constipation and blood in stool.  Endocrine: Negative.   Genitourinary: Negative.   Musculoskeletal: Positive for myalgias. Negative for back pain, joint swelling, arthralgias and gait problem.  Skin: Negative.  Negative for rash.  Allergic/Immunologic: Negative.   Neurological: Negative.  Negative for dizziness.  Hematological: Negative.  Negative for adenopathy. Does not bruise/bleed easily.  Psychiatric/Behavioral: Negative.     Objective:  BP 130/88 mmHg  Pulse 83  Temp(Src) 98.9 F (37.2 C) (Oral)  Resp 16  Ht  (1.676 m)  Wt 220 lb (99.791 kg)  BMI 35.53 kg/m2  SpO2 96%  LMP 09/27/2011  BP Readings from Last 3 Encounters:  05/18/15 130/88  01/25/15 118/78  01/20/15 108/79    Wt Readings from Last 3 Encounters:  05/18/15 220 lb (  99.791 kg)  01/25/15 222 lb 6.4 oz (100.88 kg)  01/20/15 223 lb 0.6 oz (101.17 kg)    Physical Exam  Constitutional: She is oriented to person, place, and time.  Non-toxic appearance. She does not have a sickly appearance. She does not appear ill. No distress.  HENT:  Head: Normocephalic and atraumatic.  Right Ear: Hearing, tympanic membrane, external ear and ear canal normal.  Left Ear: Hearing, tympanic membrane, external ear and ear canal normal.  Nose: No mucosal edema or rhinorrhea.  Mouth/Throat: Oropharynx is clear and moist and mucous membranes are  normal. Mucous membranes are not pale, not dry and not cyanotic. No oral lesions. No trismus in the jaw. No uvula swelling. No oropharyngeal exudate, posterior oropharyngeal edema, posterior oropharyngeal erythema or tonsillar abscesses.  Eyes: Conjunctivae are normal. Right eye exhibits no discharge. Left eye exhibits no discharge. No scleral icterus.  Neck: Normal range of motion. Neck supple. No JVD present. No tracheal deviation present. No thyromegaly present.  Cardiovascular: Normal rate, regular rhythm, normal heart sounds and intact distal pulses.  Exam reveals no gallop and no friction rub.   No murmur heard. Pulmonary/Chest: Effort normal and breath sounds normal. No stridor. No respiratory distress. She has no wheezes. She has no rales. She exhibits no tenderness.  Abdominal: Soft. Bowel sounds are normal. She exhibits no distension and no mass. There is no tenderness. There is no rebound and no guarding.  Musculoskeletal: Normal range of motion. She exhibits no edema or tenderness.  Lymphadenopathy:    She has no cervical adenopathy.  Neurological: She is oriented to person, place, and time.  Skin: Skin is warm and dry. No rash noted. She is not diaphoretic. No erythema. No pallor.  Vitals reviewed.   Lab Results  Component Value Date   HGB 15.5* 07/15/2013   HCT 44.9 07/15/2013   GLUCOSE 308* 03/26/2014   CHOL 142 03/26/2014   TRIG * 03/26/2014    424.0 Triglyceride is over 400; calculations on Lipids are invalid.   HDL 31.80* 03/26/2014   LDLDIRECT 80.0 03/26/2014   ALT 35 07/15/2013   ALT 35 07/15/2013   AST 31 07/15/2013   AST 31 07/15/2013   NA 133* 03/26/2014   K 3.6 03/26/2014   CL 93* 03/26/2014   CREATININE 0.6 03/26/2014   BUN 17 03/26/2014   CO2 30 03/26/2014   HGBA1C 12.3* 01/20/2015    Ct Maxillofacial Wo Cm  02/18/2015   CLINICAL DATA:  Nasal congestion. Antibiotics in finished 3 weeks ago. Steroids finished 2 weeks ago.  EXAM: CT MAXILLOFACIAL WITHOUT  CONTRAST  TECHNIQUE: Multidetector CT imaging of the maxillofacial structures was performed. Multiplanar CT image reconstructions were also generated. A small metallic BB was placed on the right temple in order to reliably differentiate right from left.  COMPARISON:  None.  FINDINGS: Mild mucosal thickening is present in the left frontal sinus. The paranasal sinuses are otherwise clear. The ostiomeatal complex is deviated medially on both sides but patent. A prominent ethmoid bulla is noted bilaterally, more so on the left.  Rightward nasal septal deviation measures up to 3 mm. There is some irregularity along the mucosal surface within the nasal cavity, particularly along the inferior turbinates, suggesting nasal polyps. There is prominent mucosal thickening anteriorly within the nasal cavity without obstruction.  Globes and orbits are intact. Limited imaging of the brain is within normal limits.  IMPRESSION: 1. Prominent ethmoid bulla bilaterally, worse on the left, deviates the ostiomeatal complex medially. The ostiomeatal  complex is patent bilaterally. 2. No significant mucosal disease or fluid within the paranasal sinuses except for minimal disease in the left frontal sinus. 3. Nasal polyposis suspected with nodularity along the inferior turbinates bilaterally. 4. Moderate mucosal thickening in the anterior nasal cavity without obstruction. 5. Rightward nasal septal deviation.   Electronically Signed   By: Marin Roberts M.D.   On: 02/18/2015 14:13    Assessment & Plan:   Donna [name] was seen today for uri.  Diagnoses and all orders for this visit:  Viral URI with cough- this is viral, will offer symptomatic relief, I have asked her to stay out of work for the next 5 days. She will let me know if she develops any new or worsening symptoms. -     HYDROcodone-GuaiFENesin (FLOWTUSS) 2.5-200 MG/5ML SOLN; Take 5 mLs by mouth 4 (four) times daily as needed.   I have discontinued Ms. Groene's  levofloxacin and predniSONE. I am also having her start on HYDROcodone-GuaiFENesin. Additionally, I am having her maintain her ranitidine, Lancets, glucose blood, atorvastatin, lisinopril, chlorthalidone, sertraline, LANTUS SOLOSTAR, metFORMIN, JANUVIA, BD PEN NEEDLE NANO U/F, and LYRICA.  Meds ordered this encounter  Medications  . HYDROcodone-GuaiFENesin (FLOWTUSS) 2.5-200 MG/5ML SOLN    Sig: Take 5 mLs by mouth 4 (four) times daily as needed.    Dispense:  240 mL    Refill:  0     Follow-up: Return in about 3 weeks (around 06/08/2015).  Sanda Linger, MD

## 2015-06-09 ENCOUNTER — Other Ambulatory Visit: Payer: Self-pay | Admitting: Internal Medicine

## 2015-06-23 ENCOUNTER — Telehealth: Payer: Self-pay

## 2015-07-25 ENCOUNTER — Other Ambulatory Visit: Payer: Self-pay | Admitting: Internal Medicine

## 2015-08-10 ENCOUNTER — Ambulatory Visit: Payer: Self-pay | Admitting: *Deleted

## 2015-08-18 ENCOUNTER — Other Ambulatory Visit: Payer: Self-pay | Admitting: *Deleted

## 2015-08-18 NOTE — Patient Outreach (Signed)
[facility]) Care Management   08/18/2015  Donna [name] 11/25/1965 [id number]  Donna [name] is an 49 y.o. female [facility] for routine Link To Wellness follow up for self management assistance with Type II DM, HTN and hyperlipidemia.  Subjective:  Donna [name] says she remains very busy at work and continues to struggle with medication adherence especially with taking her second dose of Lantus. She says she is not checking her blood sugar and is not exercising or consistently following a CHO controlled meal plan. She asked that she not be screened for depression today because the holidays have been hard with this being the first since the death of her father in March of this year. She says she does not have thoughts of harming herself or others. She also says her work responsibilities are especially high at this time of the year and this is contributing to her fatigue and loss of interest in doing things. She continues to be bothered with bilateral foot neuropathy that interfered with her sleep despite taking Lyrica. She says she will have a molar pulled tomorrow due to an abscess.  Objective:   Review of Systems  Constitutional: Negative.     Physical Exam  Constitutional: She is oriented to person, place, and time. She appears well-developed and well-nourished.  Respiratory: Effort normal.  Neurological: She is alert and oriented to person, place, and time.  Skin: Skin is warm and dry.  Psychiatric: She has a normal mood and affect. Her behavior is normal. Judgment and thought content normal.   Filed Vitals:   08/18/15 0900  BP: 110/76   Filed Weights   08/18/15 0900  Weight: 224 lb 3.2 oz (101.696 kg)   Current Medications:   Current Outpatient Prescriptions  Medication Sig Dispense Refill  . atorvastatin (LIPITOR) 80 MG tablet TAKE 1 TABLET BY MOUTH ONCE DAILY 30 tablet 5  . chlorthalidone  (HYGROTON) 25 MG tablet Take 1 tablet (25 mg total) by mouth daily. 90 tablet 3  . JANUVIA 100 MG tablet TAKE 1 TABLET BY MOUTH ONCE DAILY 30 tablet 5  . LANTUS SOLOSTAR 100 UNIT/ML Solostar Pen INJECT 50 UNITS INTO THE SKIN 2 TIMES DAILY. 15 mL 5  . lisinopril (PRINIVIL,ZESTRIL) 20 MG tablet Take 1 tablet (20 mg total) by mouth daily. 90 tablet 3  . LYRICA 75 MG capsule TAKE 1 CAPSULE BY MOUTH 3 TIMES DAILY 90 capsule 3  . metFORMIN (GLUCOPHAGE) 1000 MG tablet TAKE 1 TABLET BY MOUTH TWICE DAILY 60 tablet 3  . ranitidine (ZANTAC) 150 MG tablet Take 150 mg by mouth daily as needed.      . sertraline (ZOLOFT) 100 MG tablet Take 1 tablet (100 mg total) by mouth daily. 90 tablet 3   No current facility-administered medications for this visit.    Functional Status:   In your present state of health, do you have any difficulty performing the following activities: 11/30/2014  Hearing? N  Vision? N  Difficulty concentrating or making decisions? N  Walking or climbing stairs? N  Dressing or bathing? N  Doing errands, shopping? 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, bilateral neuropathic pain in feet not well controlled with current dose of Lyrica  [facility]  [facility] Problem [facility]  Most Recent Value   Care Plan Problem One  Type II DM with chronically elevated Hgb A1C- last Hgb A1C= 12.3% on 01/20/15, HTN and meeting treatment targets of <140/<90 and hyperlipidemia with lipid profile of 03/26/14 showing total cholesterol = 142, triglycerides= 424 and HDL= 31.8,  not motivated to change behavior at present   Role Documenting the Problem One  Care Management Coordinator   Care Plan for Problem One  Active   [facility] Long Term Goal (31-90 days)  Make appointment with primary care MD and discuss trying a longer acting insulin, such as  Evaristo Bury, since it is difficult for you to remember to take your second dose of Lantus   [facility] Long Term Goal Start Date  08/18/15   Interventions for Problem One Long Term Goal  discussed medication adherence barriers, discussed possible alternatives to Lantus bid and encouraged Donna [name] to discuss with her primary care provider, this RNCM will contact pharmacist to see what the Evaristo Bury copay will be at the outpatient pharmacy, discussed seeing a podiatrist to assess and treat her bilateral foot neuropathy, encouraged Donna [name] to make an appointment to see primary care MD to discuss longer acting insulins to assist with improving glycemic control and to schedule her annual wellness exam as she is overdue     RNCM to fax today's office visit note to Dr. Okey Dupre. RNCM will meet quarterly and as needed with patient per Link To Wellness program guidelines to assist with Type II DM, HTN and hyperlipidemia self-management and assess patient's progress toward mutually set goals. Bary Richard RN,CCM,CDE [facility] Care Management Coordinator Link To Wellness Office Phone [phone] Office Fax [phone]

## 2015-08-22 ENCOUNTER — Emergency Department (HOSPITAL_COMMUNITY)
Admission: EM | Admit: 2015-08-22 | Discharge: 2015-08-23 | Disposition: A | Discharge status: Home / Self Care | Payer: 59 | Attending: Emergency Medicine | Admitting: Emergency Medicine

## 2015-08-22 ENCOUNTER — Encounter (HOSPITAL_COMMUNITY): Payer: Self-pay | Admitting: Family Medicine

## 2015-08-22 DIAGNOSIS — Z7984 Long term (current) use of oral hypoglycemic drugs: Secondary | ICD-10-CM | POA: Diagnosis not present

## 2015-08-22 DIAGNOSIS — E669 Obesity, unspecified: Secondary | ICD-10-CM | POA: Insufficient documentation

## 2015-08-22 DIAGNOSIS — Z8739 Personal history of other diseases of the musculoskeletal system and connective tissue: Secondary | ICD-10-CM | POA: Diagnosis not present

## 2015-08-22 DIAGNOSIS — F419 Anxiety disorder, unspecified: Secondary | ICD-10-CM | POA: Insufficient documentation

## 2015-08-22 DIAGNOSIS — I10 Essential (primary) hypertension: Secondary | ICD-10-CM | POA: Diagnosis not present

## 2015-08-22 DIAGNOSIS — Z79899 Other long term (current) drug therapy: Secondary | ICD-10-CM | POA: Diagnosis not present

## 2015-08-22 DIAGNOSIS — F410 Panic disorder [episodic paroxysmal anxiety] without agoraphobia: Secondary | ICD-10-CM | POA: Diagnosis present

## 2015-08-22 DIAGNOSIS — E119 Type 2 diabetes mellitus without complications: Secondary | ICD-10-CM | POA: Diagnosis not present

## 2015-08-22 DIAGNOSIS — Z794 Long term (current) use of insulin: Secondary | ICD-10-CM | POA: Insufficient documentation

## 2015-08-22 DIAGNOSIS — E785 Hyperlipidemia, unspecified: Secondary | ICD-10-CM | POA: Insufficient documentation

## 2015-08-22 NOTE — ED Notes (Signed)
Patient reports she was lying in bed, watching television. Pt reports she started feeling short of breath, like she could not get her breath, jittery, and feeling like something is wrong with her. Pt appears anxious and uneasy.

## 2015-08-23 ENCOUNTER — Emergency Department (HOSPITAL_COMMUNITY): Payer: 59

## 2015-08-23 DIAGNOSIS — Z794 Long term (current) use of insulin: Secondary | ICD-10-CM | POA: Diagnosis not present

## 2015-08-23 DIAGNOSIS — F419 Anxiety disorder, unspecified: Secondary | ICD-10-CM | POA: Diagnosis not present

## 2015-08-23 DIAGNOSIS — R0602 Shortness of breath: Secondary | ICD-10-CM | POA: Diagnosis not present

## 2015-08-23 DIAGNOSIS — J310 Chronic rhinitis: Secondary | ICD-10-CM | POA: Diagnosis not present

## 2015-08-23 DIAGNOSIS — Z8739 Personal history of other diseases of the musculoskeletal system and connective tissue: Secondary | ICD-10-CM | POA: Diagnosis not present

## 2015-08-23 DIAGNOSIS — E785 Hyperlipidemia, unspecified: Secondary | ICD-10-CM | POA: Diagnosis not present

## 2015-08-23 DIAGNOSIS — E669 Obesity, unspecified: Secondary | ICD-10-CM | POA: Diagnosis not present

## 2015-08-23 DIAGNOSIS — Z79899 Other long term (current) drug therapy: Secondary | ICD-10-CM | POA: Diagnosis not present

## 2015-08-23 DIAGNOSIS — Z7984 Long term (current) use of oral hypoglycemic drugs: Secondary | ICD-10-CM | POA: Diagnosis not present

## 2015-08-23 DIAGNOSIS — E119 Type 2 diabetes mellitus without complications: Secondary | ICD-10-CM | POA: Diagnosis not present

## 2015-08-23 DIAGNOSIS — I10 Essential (primary) hypertension: Secondary | ICD-10-CM | POA: Diagnosis not present

## 2015-08-23 LAB — CBC WITH DIFFERENTIAL/PLATELET
BASOS ABS: 0 10*3/uL (ref 0.0–0.1)
BASOS PCT: 0 %
EOS PCT: 0 %
Eosinophils Absolute: 0 10*3/uL (ref 0.0–0.7)
HCT: 37.9 % (ref 36.0–46.0)
Hemoglobin: 12.9 g/dL (ref 12.0–15.0)
LYMPHS PCT: 18 %
Lymphs Abs: 1.8 10*3/uL (ref 0.7–4.0)
MCH: 30.9 pg (ref 26.0–34.0)
MCHC: 34 g/dL (ref 30.0–36.0)
MCV: 90.9 fL (ref 78.0–100.0)
MONO ABS: 0.6 10*3/uL (ref 0.1–1.0)
MONOS PCT: 6 %
Neutro Abs: 7.4 10*3/uL (ref 1.7–7.7)
Neutrophils Relative %: 76 %
PLATELETS: 260 10*3/uL (ref 150–400)
RBC: 4.17 MIL/uL (ref 3.87–5.11)
RDW: 11.9 % (ref 11.5–15.5)
WBC: 9.8 10*3/uL (ref 4.0–10.5)

## 2015-08-23 LAB — CBG MONITORING, ED: Glucose-Capillary: 269 mg/dL — ABNORMAL HIGH (ref 65–99)

## 2015-08-23 MED ORDER — HYDROXYZINE HCL 25 MG PO TABS: 25.0000 mg | ORAL_TABLET | Freq: Four times a day (QID) | ORAL | Status: DC | PRN

## 2015-08-23 MED FILL — hydrOXYzine HCL 25 MG TABS: 25 | 3 days supply | Qty: 12 | Fill #0

## 2015-08-23 MED FILL — HYDROCODON-APAP 5-325: 5-325 | 2 days supply | Qty: 20 | Fill #0

## 2015-08-23 MED FILL — CLINDAMYCIN HCL 300 MG CAPS: 300 | 10 days supply | Qty: 30 | Fill #0

## 2015-08-23 NOTE — Discharge Instructions (Signed)
Panic Attacks °Panic attacks are sudden, short feelings of great fear or discomfort. You may have them for no reason when you are relaxed, when you are uneasy (anxious), or when you are sleeping.  °HOME CARE °· Take all your medicines as told. °· Check with your doctor before starting new medicines. °· Keep all doctor visits. °GET HELP IF: °· You are not able to take your medicines as told. °· Your symptoms do not get better. °· Your symptoms get worse. °GET HELP RIGHT AWAY IF: °· Your attacks seem different than your normal attacks. °· You have thoughts about hurting yourself or others. °· You take panic attack medicine and you have a side effect. °MAKE SURE YOU: °· Understand these instructions. °· Will watch your condition. °· Will get help right away if you are not doing well or get worse. °  °This information is not intended to replace advice given to you by your health care provider. Make sure you discuss any questions you have with your health care provider. °  °Document Released: 09/08/2010 Document Revised: 05/27/2013 Document Reviewed: 03/20/2013 °Elsevier Interactive Patient Education ©2016 [employer] ° °

## 2015-08-23 NOTE — ED Provider Notes (Signed)
CSN: [id number]     Arrival date & time 08/22/15  2318 History   First MD Initiated Contact with Patient 08/23/15 [phone]     Chief Complaint  Patient presents with  . Panic Attack     (Consider location/radiation/quality/duration/timing/severity/associated sxs/prior Treatment) HPI Comments: Is a 50 year old female with a history of hypertension, diabetes, hyperlipidemia who recently had dental surgery and has been taking Vicodin and ibuprofen on a regular basis for the past 4 days.  Tonight while trying to fall sleep.  She noticed that she was feeling short of breath, felt very jittery and just had a panicky sensation that there was something wrong.  She said since arrival in the emergency room.  She feels marginally better.  She states that she knows that she was not having trouble breathing but just felt this overwhelming anxiety.  Does not have a history of anxiety or panic attacks.  She states that she is unaware of any stressors in her life. Denies any new medication except for the Vicodin that she's been taking for her dental pain does not use any over-the-counter supplements.  The history is provided by the patient.    Past Medical History  Diagnosis Date  . Hypertension   . Diabetes mellitus     type 2- uncontrolled  . Lumbar disc disease   . Hyperlipidemia   . Obesity    Past Surgical History  Procedure Laterality Date  . Reduction mammoplasty bilaterally    . Cholecystectomy  '96    Lap  . Esi      lumbar spine   Family History  Problem Relation Age of Onset  . Other Father     CHF  . Coronary artery disease Father   . Cancer Father     renal cell carcinoma  . Hypertension Father   . Diabetes Father   . Diabetes Mother   . Coronary artery disease Mother   . Heart attack Mother   . Cancer Paternal Aunt     Breast  . Atrial fibrillation Other   . Peripheral vascular disease Other   . Heart disease Other    Social History  Substance Use Topics  . Smoking  status: Never Smoker   . Smokeless tobacco: Never Used  . Alcohol Use: No   OB History    No data available     Review of Systems  Constitutional: Negative for fever and chills.  Respiratory: Positive for shortness of breath. Negative for cough and wheezing.   Cardiovascular: Negative for chest pain.  Musculoskeletal: Negative.   Psychiatric/Behavioral: The patient is nervous/anxious.   All other systems reviewed and are negative.     Allergies  Review of patient's allergies indicates no known allergies.  Home Medications   Prior to Admission medications   Medication Sig Start Date End Date Taking? Authorizing Provider  atorvastatin (LIPITOR) 80 MG tablet TAKE 1 TABLET BY MOUTH ONCE DAILY 06/10/15  Yes Myrlene Broker, MD  BD PEN NEEDLE NANO U/F 32G X 4 MM MISC USE AS DIRECTED TO TEST BLOOD SUGAR TWICE DAILY DX: 250.02 04/04/15  Yes Myrlene Broker, MD  chlorthalidone (HYGROTON) 25 MG tablet Take 1 tablet (25 mg total) by mouth daily. 11/03/14  Yes Myrlene Broker, MD  glucose blood test strip True test glucose test strip Test sugar at least twice daily (one fasting and one random post-prandial dx: E11.9 07/19/14  Yes Myrlene Broker, MD  HYDROcodone-acetaminophen (NORCO/VICODIN) 5-325 MG tablet Take 1 tablet by  mouth 3 (three) times daily as needed. For dental pain. 08/19/15  Yes Historical Provider, MD  ibuprofen (ADVIL,MOTRIN) 600 MG tablet Take 600 mg by mouth 3 (three) times daily as needed. For dental pain. 08/19/15  Yes Historical Provider, MD  JANUVIA 100 MG tablet TAKE 1 TABLET BY MOUTH ONCE DAILY 04/01/15  Yes Myrlene BrokerElizabeth A Crawford, MD  Lancets MISC 3 (three) times daily. TRUEPLUS ULTRA THIN LANCETS TO TEST BLOOD SUGAR TWICE DAILY DX: 250.02 12/25/13  Yes Jacques NavyMichael E Norins, MD  LANTUS SOLOSTAR 100 UNIT/ML Solostar Pen INJECT 50 UNITS INTO THE SKIN 2 TIMES DAILY. 01/05/15  Yes Myrlene BrokerElizabeth A Crawford, MD  lisinopril (PRINIVIL,ZESTRIL) 20 MG tablet Take 1 tablet  (20 mg total) by mouth daily. 09/30/14  Yes Myrlene BrokerElizabeth A Crawford, MD  LYRICA 75 MG capsule TAKE 1 CAPSULE BY MOUTH 3 TIMES DAILY 04/20/15  Yes Myrlene BrokerElizabeth A Crawford, MD  metFORMIN (GLUCOPHAGE) 1000 MG tablet TAKE 1 TABLET BY MOUTH TWICE DAILY 07/25/15  Yes Myrlene BrokerElizabeth A Crawford, MD  ranitidine (ZANTAC) 150 MG tablet Take 150 mg by mouth daily as needed for heartburn.    Yes Historical Provider, MD  sertraline (ZOLOFT) 100 MG tablet Take 1 tablet (100 mg total) by mouth daily. 11/03/14  Yes Myrlene BrokerElizabeth A Crawford, MD  HYDROcodone-GuaiFENesin (FLOWTUSS) 2.5-200 MG/5ML SOLN Take 5 mLs by mouth 4 (four) times daily as needed. Patient not taking: Reported on 08/18/2015 05/18/15   Etta Grandchildhomas L Jones, MD  hydrOXYzine (ATARAX/VISTARIL) 25 MG tablet Take 1 tablet (25 mg total) by mouth every 6 (six) hours as needed for anxiety. 08/23/15   Earley FavorGail Deontray Hunnicutt, NP   BP 135/87 mmHg  Pulse 95  Temp(Src) 98.1 F (36.7 C) (Oral)  Resp 20  Ht 5\' 6"  (1.676 m)  Wt 102.059 kg  BMI 36.33 kg/m2  SpO2 96%  LMP 09/27/2011 Physical Exam  Constitutional: She is oriented to person, place, and time. She appears well-developed and well-nourished.  HENT:  Head: Normocephalic.  Eyes: Pupils are equal, round, and reactive to light.  Neck: Normal range of motion.  Cardiovascular: Normal rate and regular rhythm.   No murmur heard. Pulmonary/Chest: Effort normal and breath sounds normal.  Abdominal: Soft. She exhibits no distension. There is no tenderness.  Musculoskeletal: Normal range of motion. She exhibits no edema.  Neurological: She is alert and oriented to person, place, and time.  Skin: Skin is warm and dry.  Nursing note and vitals reviewed.   ED Course  Procedures (including critical care time) Labs Review Labs Reviewed  CBG MONITORING, ED - Abnormal; Notable for the following:    Glucose-Capillary 269 (*)    All other components within normal limits  CBC WITH DIFFERENTIAL/PLATELET    Imaging Review Dg Chest 2  View  08/23/2015  CLINICAL DATA:  Acute onset of shortness of breath. Initial encounter. EXAM: CHEST  2 VIEW COMPARISON:  Chest radiograph performed 01/22/2008 FINDINGS: The lungs are well-aerated and clear. There is no evidence of focal opacification, pleural effusion or pneumothorax. The heart is normal in size; the mediastinal contour is within normal limits. No acute osseous abnormalities are seen. Clips are noted within the right upper quadrant, reflecting prior cholecystectomy. IMPRESSION: No acute cardiopulmonary process seen. Electronically Signed   By: Roanna RaiderJeffery  Chang M.D.   On: 08/23/2015 03:03   I have personally reviewed and evaluated these images and lab results as part of my medical decision-making.   EKG Interpretation None      MDM   Final diagnoses:  Anxiety  Earley Favor, NP 08/23/15 0340  Melene Plan, DO 08/23/15 [phone]

## 2015-09-16 ENCOUNTER — Other Ambulatory Visit: Payer: Self-pay | Admitting: Internal Medicine

## 2015-09-16 MED FILL — CHLORTHALIDONE 25 MG TABLET: 25 | 90 days supply | Qty: 90 | Fill #3

## 2015-09-16 MED FILL — LYRICA 75 MG CAPSULE: 75 | 30 days supply | Qty: 90 | Fill #3

## 2015-09-16 MED FILL — JANUVIA 100 MG TABLET: 100 | 90 days supply | Qty: 90 | Fill #1

## 2015-09-16 MED FILL — LISINOPRIL 20 MG TABLET: 20 | 90 days supply | Qty: 90 | Fill #3

## 2015-10-04 ENCOUNTER — Encounter: Payer: Self-pay | Admitting: Internal Medicine

## 2015-10-04 ENCOUNTER — Ambulatory Visit (INDEPENDENT_AMBULATORY_CARE_PROVIDER_SITE_OTHER): Payer: 59 | Admitting: Internal Medicine

## 2015-10-04 VITALS — BP 130/90 | HR 81 | Temp 98.7°F | Ht 66.0 in | Wt 219.2 lb

## 2015-10-04 DIAGNOSIS — R062 Wheezing: Secondary | ICD-10-CM | POA: Diagnosis not present

## 2015-10-04 DIAGNOSIS — R05 Cough: Secondary | ICD-10-CM | POA: Diagnosis not present

## 2015-10-04 DIAGNOSIS — R059 Cough, unspecified: Secondary | ICD-10-CM

## 2015-10-04 DIAGNOSIS — IMO0002 Reserved for concepts with insufficient information to code with codable children: Secondary | ICD-10-CM

## 2015-10-04 DIAGNOSIS — E1140 Type 2 diabetes mellitus with diabetic neuropathy, unspecified: Secondary | ICD-10-CM

## 2015-10-04 DIAGNOSIS — E1165 Type 2 diabetes mellitus with hyperglycemia: Secondary | ICD-10-CM

## 2015-10-04 MED ORDER — HYDROCODONE-HOMATROPINE 5-1.5 MG/5ML PO SYRP: 5.0000 mL | ORAL_SOLUTION | Freq: Four times a day (QID) | ORAL | Status: DC | PRN

## 2015-10-04 MED ORDER — PREDNISONE 10 MG PO TABS: ORAL_TABLET | ORAL | Status: DC

## 2015-10-04 MED ORDER — LEVOFLOXACIN 500 MG PO TABS: 500.0000 mg | ORAL_TABLET | Freq: Every day | ORAL | Status: DC

## 2015-10-04 MED FILL — levoFLOXacin 500 MG TABS: 500 | 10 days supply | Qty: 10 | Fill #0

## 2015-10-04 MED FILL — predniSONE 10 MG TABS: 10 | 5 days supply | Qty: 10 | Fill #0

## 2015-10-04 MED FILL — HYDROCODONE-HOMATROPINE SYR: 5-1.5 | 9 days supply | Qty: 180 | Fill #0

## 2015-10-04 NOTE — Patient Instructions (Signed)
Please take all new medication as prescribed - the antibiotic, cough medicine, and prednisone  Please continue all other medications as before, and refills have been done if requested.  Please have the pharmacy call with any other refills you may need.  Please keep your appointments with your specialists as you may have planned  You are given the work note today

## 2015-10-04 NOTE — Progress Notes (Signed)
Pre visit review using our clinic review tool, if applicable. No additional management support is needed unless otherwise documented below in the visit note. 

## 2015-10-05 NOTE — Assessment & Plan Note (Addendum)
stable overall by history and exam, and pt to continue medical treatment as before,  to f/u any worsening symptoms or concerns, pt to call for onset polys or cbg > 200 on steroid tx

## 2015-10-05 NOTE — Assessment & Plan Note (Signed)
Mild to mod, c/w bronchitis vs pna, declines cxr, for antibx course, cough med prn,  to f/u any worsening symptoms or concerns 

## 2015-10-05 NOTE — Progress Notes (Signed)
Subjective:    Patient ID: Donna [name], female    DOB: 1966/03/03, 50 y.o.   MRN: [id number]  HPI  Here with acute onset mild to mod 8 days ST, HA, general weakness and malaise, with prod cough greenish sputum, but Pt denies chest pain, increased sob or doe, wheezing, orthopnea, PND, increased LE swelling, palpitations, dizziness or syncope, except for onset mild wheezing/sob last PM.  Pt denies new neurological symptoms such as new headache, or facial or extremity weakness or numbness   Pt denies polydipsia, polyuria.   Pt denies fever, wt loss, night sweats, loss of appetite, or other constitutional symptoms except for the above Past Medical History  Diagnosis Date  . Hypertension   . Diabetes mellitus     type 2- uncontrolled  . Lumbar disc disease   . Hyperlipidemia   . Obesity    Past Surgical History  Procedure Laterality Date  . Reduction mammoplasty bilaterally    . Cholecystectomy  '96    Lap  . Esi      lumbar spine    reports that she has never smoked. She has never used smokeless tobacco. She reports that she does not drink alcohol or use illicit drugs. family history includes Atrial fibrillation in her other; Cancer in her father and paternal aunt; Coronary artery disease in her father and mother; Diabetes in her father and mother; Heart attack in her mother; Heart disease in her other; Hypertension in her father; Other in her father; Peripheral vascular disease in her other. No Known Allergies Current Outpatient Prescriptions on File Prior to Visit  Medication Sig Dispense Refill  . atorvastatin (LIPITOR) 80 MG tablet TAKE 1 TABLET BY MOUTH ONCE DAILY 30 tablet 5  . BD PEN NEEDLE NANO U/F 32G X 4 MM MISC USE AS DIRECTED TO TEST BLOOD SUGAR TWICE DAILY DX: 250.02 300 each 3  . chlorthalidone (HYGROTON) 25 MG tablet Take 1 tablet (25 mg total) by mouth daily. 90 tablet 3  . glucose blood test strip True test glucose test strip Test sugar at least twice daily (one  fasting and one random post-prandial dx: E11.9 100 each 5  . JANUVIA 100 MG tablet TAKE 1 TABLET BY MOUTH ONCE DAILY 30 tablet 5  . Lancets MISC 3 (three) times daily. TRUEPLUS ULTRA THIN LANCETS TO TEST BLOOD SUGAR TWICE DAILY DX: 250.02    . LANTUS SOLOSTAR 100 UNIT/ML Solostar Pen INJECT 50 UNITS INTO THE SKIN 2 TIMES DAILY. 15 mL 3  . lisinopril (PRINIVIL,ZESTRIL) 20 MG tablet Take 1 tablet (20 mg total) by mouth daily. 90 tablet 3  . LYRICA 75 MG capsule TAKE 1 CAPSULE BY MOUTH 3 TIMES DAILY 90 capsule 3  . metFORMIN (GLUCOPHAGE) 1000 MG tablet TAKE 1 TABLET BY MOUTH TWICE DAILY 60 tablet 3  . ranitidine (ZANTAC) 150 MG tablet Take 150 mg by mouth daily as needed for heartburn.     . sertraline (ZOLOFT) 100 MG tablet Take 1 tablet (100 mg total) by mouth daily. 90 tablet 3   No current facility-administered medications on file prior to visit.   Review of Systems  Constitutional: Negative for unusual diaphoresis or night sweats HENT: Negative for ringing in ear or discharge Eyes: Negative for double vision or worsening visual disturbance.  Respiratory: Negative for choking and stridor.   Gastrointestinal: Negative for vomiting or other signifcant bowel change Genitourinary: Negative for hematuria or change in urine volume.  Musculoskeletal: Negative for other MSK pain or swelling Skin:  Negative for color change and worsening wound.  Neurological: Negative for tremors and numbness other than noted  Psychiatric/Behavioral: Negative for decreased concentration or agitation other than above       Objective:   Physical Exam BP 130/90 mmHg  Pulse 81  Temp(Src) 98.7 F (37.1 C) (Oral)  Ht  (1.676 m)  Wt 219 lb 4 oz (99.451 kg)  BMI 35.40 kg/m2  SpO2 95%  LMP 09/27/2011 VS noted, mild ill Constitutional: Pt appears in no significant distress HENT: Head: NCAT.  Right Ear: External ear normal.  Left Ear: External ear normal.  Bilat tm's with mild erythema.  Max sinus areas  mild tender.  Pharynx with mild erythema, no exudate Eyes: . Pupils are equal, round, and reactive to light. Conjunctivae and EOM are normal Neck: Normal range of motion. Neck supple.  Cardiovascular: Normal rate and regular rhythm.   Pulmonary/Chest: Effort normal and breath sounds decreased bilat without rales but with few scattered wheezing.  Neurological: Pt is alert. Not confused , motor grossly intact Skin: Skin is warm. No rash, no LE edema Psychiatric: Pt behavior is normal. No agitation.     Assessment & Plan:

## 2015-10-05 NOTE — Assessment & Plan Note (Signed)
Mild to mod, for predpac asd course,  to f/u any worsening symptoms or concerns 

## 2015-10-25 ENCOUNTER — Other Ambulatory Visit: Payer: Self-pay | Admitting: Internal Medicine

## 2015-10-25 MED FILL — ATORVASTATIN 80 MG TABLET: 80 | 30 days supply | Qty: 30 | Fill #2

## 2015-10-25 MED FILL — LANTUS SOLOSTAR 100 UNITS/M: 100 | 60 days supply | Qty: 60 | Fill #0

## 2015-10-25 MED FILL — LYRICA 75 MG CAPSULE: 75 | 30 days supply | Qty: 90 | Fill #0

## 2015-10-25 MED FILL — SERTRALINE HCL 100 MG TAB: 100 | 90 days supply | Qty: 90 | Fill #3

## 2015-10-25 NOTE — Telephone Encounter (Signed)
Needs visit for refill as none with me since 05/2014

## 2015-10-25 NOTE — Telephone Encounter (Signed)
Sent to pharmacy 

## 2015-11-24 DIAGNOSIS — M9905 Segmental and somatic dysfunction of pelvic region: Secondary | ICD-10-CM | POA: Diagnosis not present

## 2015-11-24 DIAGNOSIS — M25551 Pain in right hip: Secondary | ICD-10-CM | POA: Diagnosis not present

## 2015-11-24 DIAGNOSIS — M9904 Segmental and somatic dysfunction of sacral region: Secondary | ICD-10-CM | POA: Diagnosis not present

## 2015-11-24 DIAGNOSIS — Q72811 Congenital shortening of right lower limb: Secondary | ICD-10-CM | POA: Diagnosis not present

## 2015-11-24 DIAGNOSIS — G603 Idiopathic progressive neuropathy: Secondary | ICD-10-CM | POA: Diagnosis not present

## 2015-11-24 DIAGNOSIS — M9903 Segmental and somatic dysfunction of lumbar region: Secondary | ICD-10-CM | POA: Diagnosis not present

## 2015-11-24 DIAGNOSIS — M5136 Other intervertebral disc degeneration, lumbar region: Secondary | ICD-10-CM | POA: Diagnosis not present

## 2015-11-29 DIAGNOSIS — M9905 Segmental and somatic dysfunction of pelvic region: Secondary | ICD-10-CM | POA: Diagnosis not present

## 2015-11-29 DIAGNOSIS — M9903 Segmental and somatic dysfunction of lumbar region: Secondary | ICD-10-CM | POA: Diagnosis not present

## 2015-11-29 DIAGNOSIS — Q72811 Congenital shortening of right lower limb: Secondary | ICD-10-CM | POA: Diagnosis not present

## 2015-11-29 DIAGNOSIS — G603 Idiopathic progressive neuropathy: Secondary | ICD-10-CM | POA: Diagnosis not present

## 2015-11-29 DIAGNOSIS — M9904 Segmental and somatic dysfunction of sacral region: Secondary | ICD-10-CM | POA: Diagnosis not present

## 2015-11-29 DIAGNOSIS — M5136 Other intervertebral disc degeneration, lumbar region: Secondary | ICD-10-CM | POA: Diagnosis not present

## 2015-11-29 DIAGNOSIS — M25551 Pain in right hip: Secondary | ICD-10-CM | POA: Diagnosis not present

## 2015-12-05 DIAGNOSIS — M9903 Segmental and somatic dysfunction of lumbar region: Secondary | ICD-10-CM | POA: Diagnosis not present

## 2015-12-05 DIAGNOSIS — M9904 Segmental and somatic dysfunction of sacral region: Secondary | ICD-10-CM | POA: Diagnosis not present

## 2015-12-05 DIAGNOSIS — M25551 Pain in right hip: Secondary | ICD-10-CM | POA: Diagnosis not present

## 2015-12-05 DIAGNOSIS — G603 Idiopathic progressive neuropathy: Secondary | ICD-10-CM | POA: Diagnosis not present

## 2015-12-05 DIAGNOSIS — Q72811 Congenital shortening of right lower limb: Secondary | ICD-10-CM | POA: Diagnosis not present

## 2015-12-05 DIAGNOSIS — M5136 Other intervertebral disc degeneration, lumbar region: Secondary | ICD-10-CM | POA: Diagnosis not present

## 2015-12-05 DIAGNOSIS — M9905 Segmental and somatic dysfunction of pelvic region: Secondary | ICD-10-CM | POA: Diagnosis not present

## 2015-12-06 DIAGNOSIS — Q72811 Congenital shortening of right lower limb: Secondary | ICD-10-CM | POA: Diagnosis not present

## 2015-12-06 DIAGNOSIS — M5136 Other intervertebral disc degeneration, lumbar region: Secondary | ICD-10-CM | POA: Diagnosis not present

## 2015-12-06 DIAGNOSIS — M9903 Segmental and somatic dysfunction of lumbar region: Secondary | ICD-10-CM | POA: Diagnosis not present

## 2015-12-06 DIAGNOSIS — M9905 Segmental and somatic dysfunction of pelvic region: Secondary | ICD-10-CM | POA: Diagnosis not present

## 2015-12-06 DIAGNOSIS — M9904 Segmental and somatic dysfunction of sacral region: Secondary | ICD-10-CM | POA: Diagnosis not present

## 2015-12-06 DIAGNOSIS — G603 Idiopathic progressive neuropathy: Secondary | ICD-10-CM | POA: Diagnosis not present

## 2015-12-06 DIAGNOSIS — M25551 Pain in right hip: Secondary | ICD-10-CM | POA: Diagnosis not present

## 2015-12-06 DIAGNOSIS — M47816 Spondylosis without myelopathy or radiculopathy, lumbar region: Secondary | ICD-10-CM | POA: Diagnosis not present

## 2015-12-07 ENCOUNTER — Other Ambulatory Visit: Payer: Self-pay | Admitting: Internal Medicine

## 2015-12-07 MED FILL — metFORMIN HCL 1000 MG TABS: 1000 | 30 days supply | Qty: 60 | Fill #1

## 2015-12-08 DIAGNOSIS — G603 Idiopathic progressive neuropathy: Secondary | ICD-10-CM | POA: Diagnosis not present

## 2015-12-08 DIAGNOSIS — M9903 Segmental and somatic dysfunction of lumbar region: Secondary | ICD-10-CM | POA: Diagnosis not present

## 2015-12-08 DIAGNOSIS — M25551 Pain in right hip: Secondary | ICD-10-CM | POA: Diagnosis not present

## 2015-12-08 DIAGNOSIS — M9905 Segmental and somatic dysfunction of pelvic region: Secondary | ICD-10-CM | POA: Diagnosis not present

## 2015-12-08 DIAGNOSIS — M5136 Other intervertebral disc degeneration, lumbar region: Secondary | ICD-10-CM | POA: Diagnosis not present

## 2015-12-08 DIAGNOSIS — Q72811 Congenital shortening of right lower limb: Secondary | ICD-10-CM | POA: Diagnosis not present

## 2015-12-08 DIAGNOSIS — M9904 Segmental and somatic dysfunction of sacral region: Secondary | ICD-10-CM | POA: Diagnosis not present

## 2015-12-08 MED FILL — LYRICA 75 MG CAPSULE: 75 | 30 days supply | Qty: 90 | Fill #0

## 2015-12-12 DIAGNOSIS — Q72811 Congenital shortening of right lower limb: Secondary | ICD-10-CM | POA: Diagnosis not present

## 2015-12-12 DIAGNOSIS — M9903 Segmental and somatic dysfunction of lumbar region: Secondary | ICD-10-CM | POA: Diagnosis not present

## 2015-12-12 DIAGNOSIS — M5136 Other intervertebral disc degeneration, lumbar region: Secondary | ICD-10-CM | POA: Diagnosis not present

## 2015-12-12 DIAGNOSIS — M25551 Pain in right hip: Secondary | ICD-10-CM | POA: Diagnosis not present

## 2015-12-12 DIAGNOSIS — M9905 Segmental and somatic dysfunction of pelvic region: Secondary | ICD-10-CM | POA: Diagnosis not present

## 2015-12-12 DIAGNOSIS — G603 Idiopathic progressive neuropathy: Secondary | ICD-10-CM | POA: Diagnosis not present

## 2015-12-12 DIAGNOSIS — M9904 Segmental and somatic dysfunction of sacral region: Secondary | ICD-10-CM | POA: Diagnosis not present

## 2015-12-13 DIAGNOSIS — M9903 Segmental and somatic dysfunction of lumbar region: Secondary | ICD-10-CM | POA: Diagnosis not present

## 2015-12-13 DIAGNOSIS — M25551 Pain in right hip: Secondary | ICD-10-CM | POA: Diagnosis not present

## 2015-12-13 DIAGNOSIS — Q72811 Congenital shortening of right lower limb: Secondary | ICD-10-CM | POA: Diagnosis not present

## 2015-12-13 DIAGNOSIS — M5136 Other intervertebral disc degeneration, lumbar region: Secondary | ICD-10-CM | POA: Diagnosis not present

## 2015-12-13 DIAGNOSIS — M9905 Segmental and somatic dysfunction of pelvic region: Secondary | ICD-10-CM | POA: Diagnosis not present

## 2015-12-13 DIAGNOSIS — M9904 Segmental and somatic dysfunction of sacral region: Secondary | ICD-10-CM | POA: Diagnosis not present

## 2015-12-13 DIAGNOSIS — G603 Idiopathic progressive neuropathy: Secondary | ICD-10-CM | POA: Diagnosis not present

## 2015-12-15 DIAGNOSIS — M9903 Segmental and somatic dysfunction of lumbar region: Secondary | ICD-10-CM | POA: Diagnosis not present

## 2015-12-15 DIAGNOSIS — M5136 Other intervertebral disc degeneration, lumbar region: Secondary | ICD-10-CM | POA: Diagnosis not present

## 2015-12-15 DIAGNOSIS — G603 Idiopathic progressive neuropathy: Secondary | ICD-10-CM | POA: Diagnosis not present

## 2015-12-15 DIAGNOSIS — M25551 Pain in right hip: Secondary | ICD-10-CM | POA: Diagnosis not present

## 2015-12-15 DIAGNOSIS — M9904 Segmental and somatic dysfunction of sacral region: Secondary | ICD-10-CM | POA: Diagnosis not present

## 2015-12-15 DIAGNOSIS — M9905 Segmental and somatic dysfunction of pelvic region: Secondary | ICD-10-CM | POA: Diagnosis not present

## 2015-12-15 DIAGNOSIS — Q72811 Congenital shortening of right lower limb: Secondary | ICD-10-CM | POA: Diagnosis not present

## 2015-12-19 DIAGNOSIS — G603 Idiopathic progressive neuropathy: Secondary | ICD-10-CM | POA: Diagnosis not present

## 2015-12-19 DIAGNOSIS — M5136 Other intervertebral disc degeneration, lumbar region: Secondary | ICD-10-CM | POA: Diagnosis not present

## 2015-12-19 DIAGNOSIS — M9903 Segmental and somatic dysfunction of lumbar region: Secondary | ICD-10-CM | POA: Diagnosis not present

## 2015-12-19 DIAGNOSIS — M9905 Segmental and somatic dysfunction of pelvic region: Secondary | ICD-10-CM | POA: Diagnosis not present

## 2015-12-19 DIAGNOSIS — M9904 Segmental and somatic dysfunction of sacral region: Secondary | ICD-10-CM | POA: Diagnosis not present

## 2015-12-19 DIAGNOSIS — Q72811 Congenital shortening of right lower limb: Secondary | ICD-10-CM | POA: Diagnosis not present

## 2015-12-19 DIAGNOSIS — M25551 Pain in right hip: Secondary | ICD-10-CM | POA: Diagnosis not present

## 2015-12-20 DIAGNOSIS — M25551 Pain in right hip: Secondary | ICD-10-CM | POA: Diagnosis not present

## 2015-12-20 DIAGNOSIS — G603 Idiopathic progressive neuropathy: Secondary | ICD-10-CM | POA: Diagnosis not present

## 2015-12-20 DIAGNOSIS — M9905 Segmental and somatic dysfunction of pelvic region: Secondary | ICD-10-CM | POA: Diagnosis not present

## 2015-12-20 DIAGNOSIS — M9903 Segmental and somatic dysfunction of lumbar region: Secondary | ICD-10-CM | POA: Diagnosis not present

## 2015-12-20 DIAGNOSIS — M9904 Segmental and somatic dysfunction of sacral region: Secondary | ICD-10-CM | POA: Diagnosis not present

## 2015-12-20 DIAGNOSIS — M5136 Other intervertebral disc degeneration, lumbar region: Secondary | ICD-10-CM | POA: Diagnosis not present

## 2015-12-20 DIAGNOSIS — Q72811 Congenital shortening of right lower limb: Secondary | ICD-10-CM | POA: Diagnosis not present

## 2015-12-22 DIAGNOSIS — M25551 Pain in right hip: Secondary | ICD-10-CM | POA: Diagnosis not present

## 2015-12-22 DIAGNOSIS — Q72811 Congenital shortening of right lower limb: Secondary | ICD-10-CM | POA: Diagnosis not present

## 2015-12-22 DIAGNOSIS — M9904 Segmental and somatic dysfunction of sacral region: Secondary | ICD-10-CM | POA: Diagnosis not present

## 2015-12-22 DIAGNOSIS — M5136 Other intervertebral disc degeneration, lumbar region: Secondary | ICD-10-CM | POA: Diagnosis not present

## 2015-12-22 DIAGNOSIS — G603 Idiopathic progressive neuropathy: Secondary | ICD-10-CM | POA: Diagnosis not present

## 2015-12-22 DIAGNOSIS — M9905 Segmental and somatic dysfunction of pelvic region: Secondary | ICD-10-CM | POA: Diagnosis not present

## 2015-12-22 DIAGNOSIS — M9903 Segmental and somatic dysfunction of lumbar region: Secondary | ICD-10-CM | POA: Diagnosis not present

## 2015-12-26 DIAGNOSIS — M25551 Pain in right hip: Secondary | ICD-10-CM | POA: Diagnosis not present

## 2015-12-26 DIAGNOSIS — M9904 Segmental and somatic dysfunction of sacral region: Secondary | ICD-10-CM | POA: Diagnosis not present

## 2015-12-26 DIAGNOSIS — M9905 Segmental and somatic dysfunction of pelvic region: Secondary | ICD-10-CM | POA: Diagnosis not present

## 2015-12-26 DIAGNOSIS — Q72811 Congenital shortening of right lower limb: Secondary | ICD-10-CM | POA: Diagnosis not present

## 2015-12-26 DIAGNOSIS — M9903 Segmental and somatic dysfunction of lumbar region: Secondary | ICD-10-CM | POA: Diagnosis not present

## 2015-12-26 DIAGNOSIS — M5136 Other intervertebral disc degeneration, lumbar region: Secondary | ICD-10-CM | POA: Diagnosis not present

## 2015-12-26 DIAGNOSIS — G603 Idiopathic progressive neuropathy: Secondary | ICD-10-CM | POA: Diagnosis not present

## 2015-12-27 DIAGNOSIS — M9905 Segmental and somatic dysfunction of pelvic region: Secondary | ICD-10-CM | POA: Diagnosis not present

## 2015-12-27 DIAGNOSIS — G603 Idiopathic progressive neuropathy: Secondary | ICD-10-CM | POA: Diagnosis not present

## 2015-12-27 DIAGNOSIS — Q72811 Congenital shortening of right lower limb: Secondary | ICD-10-CM | POA: Diagnosis not present

## 2015-12-27 DIAGNOSIS — M5136 Other intervertebral disc degeneration, lumbar region: Secondary | ICD-10-CM | POA: Diagnosis not present

## 2015-12-27 DIAGNOSIS — M9903 Segmental and somatic dysfunction of lumbar region: Secondary | ICD-10-CM | POA: Diagnosis not present

## 2015-12-27 DIAGNOSIS — M9904 Segmental and somatic dysfunction of sacral region: Secondary | ICD-10-CM | POA: Diagnosis not present

## 2015-12-27 DIAGNOSIS — M25551 Pain in right hip: Secondary | ICD-10-CM | POA: Diagnosis not present

## 2016-01-02 DIAGNOSIS — M9904 Segmental and somatic dysfunction of sacral region: Secondary | ICD-10-CM | POA: Diagnosis not present

## 2016-01-02 DIAGNOSIS — G603 Idiopathic progressive neuropathy: Secondary | ICD-10-CM | POA: Diagnosis not present

## 2016-01-02 DIAGNOSIS — M5136 Other intervertebral disc degeneration, lumbar region: Secondary | ICD-10-CM | POA: Diagnosis not present

## 2016-01-02 DIAGNOSIS — M25551 Pain in right hip: Secondary | ICD-10-CM | POA: Diagnosis not present

## 2016-01-02 DIAGNOSIS — M9905 Segmental and somatic dysfunction of pelvic region: Secondary | ICD-10-CM | POA: Diagnosis not present

## 2016-01-02 DIAGNOSIS — Q72811 Congenital shortening of right lower limb: Secondary | ICD-10-CM | POA: Diagnosis not present

## 2016-01-02 DIAGNOSIS — M9903 Segmental and somatic dysfunction of lumbar region: Secondary | ICD-10-CM | POA: Diagnosis not present

## 2016-01-05 DIAGNOSIS — G603 Idiopathic progressive neuropathy: Secondary | ICD-10-CM | POA: Diagnosis not present

## 2016-01-05 DIAGNOSIS — M9904 Segmental and somatic dysfunction of sacral region: Secondary | ICD-10-CM | POA: Diagnosis not present

## 2016-01-05 DIAGNOSIS — Q72811 Congenital shortening of right lower limb: Secondary | ICD-10-CM | POA: Diagnosis not present

## 2016-01-05 DIAGNOSIS — M9905 Segmental and somatic dysfunction of pelvic region: Secondary | ICD-10-CM | POA: Diagnosis not present

## 2016-01-05 DIAGNOSIS — M5136 Other intervertebral disc degeneration, lumbar region: Secondary | ICD-10-CM | POA: Diagnosis not present

## 2016-01-05 DIAGNOSIS — M9903 Segmental and somatic dysfunction of lumbar region: Secondary | ICD-10-CM | POA: Diagnosis not present

## 2016-01-05 DIAGNOSIS — M25551 Pain in right hip: Secondary | ICD-10-CM | POA: Diagnosis not present

## 2016-01-09 DIAGNOSIS — M9904 Segmental and somatic dysfunction of sacral region: Secondary | ICD-10-CM | POA: Diagnosis not present

## 2016-01-09 DIAGNOSIS — M9903 Segmental and somatic dysfunction of lumbar region: Secondary | ICD-10-CM | POA: Diagnosis not present

## 2016-01-09 DIAGNOSIS — M5136 Other intervertebral disc degeneration, lumbar region: Secondary | ICD-10-CM | POA: Diagnosis not present

## 2016-01-09 DIAGNOSIS — G603 Idiopathic progressive neuropathy: Secondary | ICD-10-CM | POA: Diagnosis not present

## 2016-01-09 DIAGNOSIS — M25551 Pain in right hip: Secondary | ICD-10-CM | POA: Diagnosis not present

## 2016-01-09 DIAGNOSIS — Q72811 Congenital shortening of right lower limb: Secondary | ICD-10-CM | POA: Diagnosis not present

## 2016-01-09 DIAGNOSIS — M9905 Segmental and somatic dysfunction of pelvic region: Secondary | ICD-10-CM | POA: Diagnosis not present

## 2016-01-11 ENCOUNTER — Other Ambulatory Visit: Payer: Self-pay | Admitting: Internal Medicine

## 2016-01-11 DIAGNOSIS — M25551 Pain in right hip: Secondary | ICD-10-CM | POA: Diagnosis not present

## 2016-01-11 DIAGNOSIS — M9903 Segmental and somatic dysfunction of lumbar region: Secondary | ICD-10-CM | POA: Diagnosis not present

## 2016-01-11 DIAGNOSIS — M9905 Segmental and somatic dysfunction of pelvic region: Secondary | ICD-10-CM | POA: Diagnosis not present

## 2016-01-11 DIAGNOSIS — M5136 Other intervertebral disc degeneration, lumbar region: Secondary | ICD-10-CM | POA: Diagnosis not present

## 2016-01-11 DIAGNOSIS — Q72811 Congenital shortening of right lower limb: Secondary | ICD-10-CM | POA: Diagnosis not present

## 2016-01-11 DIAGNOSIS — G603 Idiopathic progressive neuropathy: Secondary | ICD-10-CM | POA: Diagnosis not present

## 2016-01-11 DIAGNOSIS — M9904 Segmental and somatic dysfunction of sacral region: Secondary | ICD-10-CM | POA: Diagnosis not present

## 2016-01-17 NOTE — Telephone Encounter (Signed)
Please advise on the Lyrica.

## 2016-01-17 NOTE — Telephone Encounter (Signed)
None of these can be approved without visit. No HgA1c in over 1 year. If someone else is treating her diabetes she can call them but needs acute visit for any refills.

## 2016-01-19 ENCOUNTER — Encounter: Payer: Self-pay | Admitting: Internal Medicine

## 2016-01-19 NOTE — Telephone Encounter (Signed)
Left message for patient to call back. She needs an office visit before she can have any future refills.

## 2016-01-23 MED ORDER — LISINOPRIL 20 MG PO TABS: 20.0000 mg | ORAL_TABLET | Freq: Every day | ORAL | Status: DC

## 2016-01-23 MED ORDER — METFORMIN HCL 1000 MG PO TABS: 1000.0000 mg | ORAL_TABLET | Freq: Two times a day (BID) | ORAL | Status: DC

## 2016-01-23 MED ORDER — CHLORTHALIDONE 25 MG PO TABS: 25.0000 mg | ORAL_TABLET | Freq: Every day | ORAL | Status: DC

## 2016-01-23 MED ORDER — SITAGLIPTIN PHOSPHATE 100 MG PO TABS: 100.0000 mg | ORAL_TABLET | Freq: Every day | ORAL | Status: DC

## 2016-01-23 MED ORDER — PREGABALIN 75 MG PO CAPS: 75.0000 mg | ORAL_CAPSULE | Freq: Three times a day (TID) | ORAL | Status: DC

## 2016-01-23 MED FILL — LYRICA 75 MG CAPSULE: 75 | 30 days supply | Qty: 90 | Fill #0

## 2016-01-23 MED FILL — metFORMIN HCL 1000 MG TABS: 1000 | 30 days supply | Qty: 60 | Fill #0

## 2016-01-23 MED FILL — JANUVIA 100 MG TABLET: 100 | 30 days supply | Qty: 30 | Fill #0

## 2016-01-23 MED FILL — LISINOPRIL 20 MG TABLET: 20 | 30 days supply | Qty: 30 | Fill #0

## 2016-01-23 MED FILL — CHLORTHALIDONE 25 MG TABLET: 25 | 30 days supply | Qty: 30 | Fill #0

## 2016-01-24 DIAGNOSIS — M9905 Segmental and somatic dysfunction of pelvic region: Secondary | ICD-10-CM | POA: Diagnosis not present

## 2016-01-24 DIAGNOSIS — M9903 Segmental and somatic dysfunction of lumbar region: Secondary | ICD-10-CM | POA: Diagnosis not present

## 2016-01-24 DIAGNOSIS — M5136 Other intervertebral disc degeneration, lumbar region: Secondary | ICD-10-CM | POA: Diagnosis not present

## 2016-01-24 DIAGNOSIS — G603 Idiopathic progressive neuropathy: Secondary | ICD-10-CM | POA: Diagnosis not present

## 2016-01-24 DIAGNOSIS — M25551 Pain in right hip: Secondary | ICD-10-CM | POA: Diagnosis not present

## 2016-01-24 DIAGNOSIS — M9904 Segmental and somatic dysfunction of sacral region: Secondary | ICD-10-CM | POA: Diagnosis not present

## 2016-01-24 DIAGNOSIS — Q72811 Congenital shortening of right lower limb: Secondary | ICD-10-CM | POA: Diagnosis not present

## 2016-02-03 ENCOUNTER — Other Ambulatory Visit (INDEPENDENT_AMBULATORY_CARE_PROVIDER_SITE_OTHER): Payer: 59

## 2016-02-03 ENCOUNTER — Ambulatory Visit (INDEPENDENT_AMBULATORY_CARE_PROVIDER_SITE_OTHER): Payer: 59 | Admitting: Internal Medicine

## 2016-02-03 ENCOUNTER — Encounter: Payer: Self-pay | Admitting: Internal Medicine

## 2016-02-03 VITALS — BP 156/98 | HR 99 | Temp 98.2°F | Ht 66.0 in | Wt 225.1 lb

## 2016-02-03 DIAGNOSIS — Z794 Long term (current) use of insulin: Secondary | ICD-10-CM

## 2016-02-03 DIAGNOSIS — I10 Essential (primary) hypertension: Secondary | ICD-10-CM

## 2016-02-03 DIAGNOSIS — E785 Hyperlipidemia, unspecified: Secondary | ICD-10-CM | POA: Diagnosis not present

## 2016-02-03 DIAGNOSIS — E1140 Type 2 diabetes mellitus with diabetic neuropathy, unspecified: Secondary | ICD-10-CM

## 2016-02-03 DIAGNOSIS — E118 Type 2 diabetes mellitus with unspecified complications: Secondary | ICD-10-CM

## 2016-02-03 DIAGNOSIS — E1165 Type 2 diabetes mellitus with hyperglycemia: Secondary | ICD-10-CM | POA: Diagnosis not present

## 2016-02-03 DIAGNOSIS — IMO0002 Reserved for concepts with insufficient information to code with codable children: Secondary | ICD-10-CM

## 2016-02-03 LAB — COMPREHENSIVE METABOLIC PANEL
ALK PHOS: 70 U/L (ref 39–117)
ALT: 38 U/L — ABNORMAL HIGH (ref 0–35)
AST: 23 U/L (ref 0–37)
Albumin: 4.5 g/dL (ref 3.5–5.2)
BUN: 15 mg/dL (ref 6–23)
CALCIUM: 9.7 mg/dL (ref 8.4–10.5)
CO2: 28 mEq/L (ref 19–32)
Chloride: 93 mEq/L — ABNORMAL LOW (ref 96–112)
Creatinine, Ser: 0.74 mg/dL (ref 0.40–1.20)
GFR: 88.08 mL/min (ref >60.00)
GLUCOSE: 334 mg/dL — AB (ref 70–99)
POTASSIUM: 3.4 meq/L — AB (ref 3.5–5.1)
Sodium: 133 mEq/L — ABNORMAL LOW (ref 135–145)
TOTAL PROTEIN: 7.8 g/dL (ref 6.0–8.3)
Total Bilirubin: 0.5 mg/dL (ref 0.2–1.2)

## 2016-02-03 LAB — CBC
HCT: 43.1 % (ref 36.0–46.0)
Hemoglobin: 14.7 g/dL (ref 12.0–15.0)
MCHC: 34.1 g/dL (ref 30.0–36.0)
MCV: 90.2 fl (ref 78.0–100.0)
Platelets: 306 10*3/uL (ref 150.0–400.0)
RBC: 4.78 Mil/uL (ref 3.87–5.11)
RDW: 12.7 % (ref 11.5–15.5)
WBC: 7.5 10*3/uL (ref 4.0–10.5)

## 2016-02-03 LAB — HEMOGLOBIN A1C: HEMOGLOBIN A1C: 11 % — AB (ref 4.6–6.5)

## 2016-02-03 LAB — TSH: TSH: 0.82 u[IU]/mL (ref 0.35–4.50)

## 2016-02-03 MED ORDER — SERTRALINE HCL 100 MG PO TABS: 100.0000 mg | ORAL_TABLET | Freq: Every day | ORAL | Status: DC

## 2016-02-03 MED ORDER — INSULIN GLARGINE 100 UNIT/ML SOLOSTAR PEN: 50.0000 [IU] | PEN_INJECTOR | Freq: Two times a day (BID) | SUBCUTANEOUS | Status: DC

## 2016-02-03 MED ORDER — LISINOPRIL-HYDROCHLOROTHIAZIDE 20-25 MG PO TABS
1.0000 | ORAL_TABLET | Freq: Every day | ORAL | Status: DC
Start: 2016-02-03 — End: 2016-09-28

## 2016-02-03 MED ORDER — EXENATIDE ER 2 MG ~~LOC~~ PEN: 2.0000 mg | PEN_INJECTOR | SUBCUTANEOUS | Status: DC

## 2016-02-03 MED ORDER — PREGABALIN 75 MG PO CAPS: 75.0000 mg | ORAL_CAPSULE | Freq: Three times a day (TID) | ORAL | Status: DC

## 2016-02-03 MED ORDER — ATORVASTATIN CALCIUM 80 MG PO TABS: 80.0000 mg | ORAL_TABLET | Freq: Every day | ORAL | Status: DC

## 2016-02-03 MED ORDER — METFORMIN HCL 1000 MG PO TABS: 1000.0000 mg | ORAL_TABLET | Freq: Two times a day (BID) | ORAL | Status: DC

## 2016-02-03 MED FILL — BYDUREON 2 MG PEN INJECT: 2 | 28 days supply | Qty: 4 | Fill #0

## 2016-02-03 MED FILL — ATORVASTATIN 80 MG TABLET: 80 | 90 days supply | Qty: 90 | Fill #0

## 2016-02-03 MED FILL — LANTUS SOLOSTAR 100 UNITS/M: 100 | 60 days supply | Qty: 60 | Fill #0

## 2016-02-03 MED FILL — SERTRALINE HCL 100 MG TAB: 100 | 90 days supply | Qty: 90 | Fill #0

## 2016-02-03 MED FILL — LISINOPRIL-HCTZ 20-25 MG TA: 20-25 | 90 days supply | Qty: 90 | Fill #0

## 2016-02-03 NOTE — Progress Notes (Signed)
Pre visit review using our clinic review tool, if applicable. No additional management support is needed unless otherwise documented below in the visit note. 

## 2016-02-05 ENCOUNTER — Encounter: Payer: Self-pay | Admitting: Internal Medicine

## 2016-02-05 NOTE — Assessment & Plan Note (Signed)
Taking her statin and checking lipid panel and adjust as needed.

## 2016-02-05 NOTE — Assessment & Plan Note (Signed)
Does not have motivation to take her meds regularly. Will simplify regimen with glp-1 and stop Venezuelajanuvia. Continue metformin. On ACE-I. Checking HgA1c and adjust as needed. Also supposed to be taking lantus 50 units BID and not faithfully taking. Sugars are high and suspect she will still be not controlled. She will get 6 month supply of meds only and no refills if no visit in that time since it has been so long since last visit. We discussed the seriousness of the complications of uncontrolled diabetes. Her father had bilateral amputations so she understands just cannot find the motivation to help her diabetes. She will work on that.

## 2016-02-05 NOTE — Assessment & Plan Note (Signed)
BP above goal, will simplify regimen to lisinopril/hctz instead of chlorthalidone and lisinopril since she is having difficulty with remembering to take meds. Checking CMP today.

## 2016-02-05 NOTE — Progress Notes (Signed)
   Subjective:    Patient ID: Donna BollmanLyndsey R Hertzog, female    DOB: 05/07/1966, 50 y.o.   MRN: [id number]  HPI The patient is a 50 YO female coming in for her diabetes. She has been cut off of medication refills for lack of visit. She is not doing well for her sugars and not taking her medictions well. She knows the consequences of this but is not able to care. She does have neuropathy from the diabetes. She is also following up of her blood pressure (above goal today without meds on usually lisinopril and chlorthalidone, not complicated), and her cholesterol (trying to take her lipitor regularly, no side effects). No new concerns. Neuropathy is slightly worse. Eye exam not up to date.   Review of Systems  Constitutional: Negative for fever, activity change, appetite change and fatigue.  HENT: Negative.   Eyes: Negative.   Respiratory: Negative for cough, chest tightness, shortness of breath and wheezing.   Cardiovascular: Negative for chest pain, palpitations and leg swelling.  Gastrointestinal: Negative for abdominal pain, diarrhea, constipation and abdominal distention.  Genitourinary: Negative.   Musculoskeletal: Positive for myalgias and arthralgias.  Skin: Negative.   Neurological: Positive for numbness.  Psychiatric/Behavioral: Negative.       Objective:   Physical Exam  Constitutional: She is oriented to person, place, and time. She appears well-developed and well-nourished. No distress.  HENT:  Head: Normocephalic and atraumatic.  Eyes: EOM are normal.  Neck: Normal range of motion.  Cardiovascular: Normal rate and regular rhythm.   Pulmonary/Chest: Effort normal and breath sounds normal. No respiratory distress. She has no wheezes. She has no rales.  Abdominal: Soft. Bowel sounds are normal. She exhibits no distension. There is no tenderness. There is no rebound.  Neurological: She is alert and oriented to person, place, and time. Coordination normal.  Skin: Skin is warm and dry.     Filed Vitals:   02/03/16 1352  BP: 156/98  Pulse: 99  Temp: 98.2 F (36.8 C)  TempSrc: Oral  Height: 5\' 6"  (1.676 m)  Weight: 225 lb 2 oz (102.116 kg)  SpO2: 97%      Assessment & Plan:

## 2016-02-06 MED ORDER — SUVOREXANT 10 MG PO TABS: 10.0000 mg | ORAL_TABLET | Freq: Every evening | ORAL | Status: DC | PRN

## 2016-02-06 MED FILL — BELSOMRA 10 MG TABLET: 10 | 30 days supply | Qty: 30 | Fill #0

## 2016-02-16 DIAGNOSIS — M25551 Pain in right hip: Secondary | ICD-10-CM | POA: Diagnosis not present

## 2016-02-16 DIAGNOSIS — G603 Idiopathic progressive neuropathy: Secondary | ICD-10-CM | POA: Diagnosis not present

## 2016-02-16 DIAGNOSIS — M9903 Segmental and somatic dysfunction of lumbar region: Secondary | ICD-10-CM | POA: Diagnosis not present

## 2016-02-16 DIAGNOSIS — M5136 Other intervertebral disc degeneration, lumbar region: Secondary | ICD-10-CM | POA: Diagnosis not present

## 2016-02-16 DIAGNOSIS — M9904 Segmental and somatic dysfunction of sacral region: Secondary | ICD-10-CM | POA: Diagnosis not present

## 2016-02-16 DIAGNOSIS — Q72811 Congenital shortening of right lower limb: Secondary | ICD-10-CM | POA: Diagnosis not present

## 2016-02-16 DIAGNOSIS — M9905 Segmental and somatic dysfunction of pelvic region: Secondary | ICD-10-CM | POA: Diagnosis not present

## 2016-03-01 ENCOUNTER — Encounter: Payer: 59 | Admitting: Internal Medicine

## 2016-03-06 ENCOUNTER — Ambulatory Visit: Payer: 59 | Admitting: *Deleted

## 2016-03-09 MED FILL — BYDUREON 2 MG PEN INJECT: 2 | 28 days supply | Qty: 4 | Fill #1

## 2016-03-09 MED FILL — metFORMIN HCL 1000 MG TABS: 1000 | 90 days supply | Qty: 180 | Fill #0

## 2016-03-15 ENCOUNTER — Other Ambulatory Visit: Payer: Self-pay | Admitting: *Deleted

## 2016-03-19 NOTE — Patient Outreach (Signed)
[facility]) Care Management   03/15/2016  Donna [name] 10-Mar-1966 [id number]  Donna [name] is an 50 y.o. female who presents to the [facility] Care Management office for routine Link To Wellness follow up for self management assistance with Type II DM, HTN, hyperlipidemia and obesity.  Subjective: Donna [name] says she saw Dr. Okey Dupre on 02/03/16 because she needed refills on her medications. She was started on weekly Bydureon for ongoing elevation of her blood sugar and Hgb A1C which ws 11.0%. [name] says she still struggles with remembering to take her medications and rarely checks her blood sugar. She says she gave up Diet Coke, she drank 6 per day, when her staff challenged her to do so. She says all she drinks now is flavored water. Sh says she had a large ganglion cyst on her right wrist that went away when she stopped the Diet Coke.  She report she had her sister take her to the emergency room on 08/22/15 for symptoms of a panic attack. She does not know what caused it and says she is still having mild ones. She was given Atarax for the attack but has not taken any more since she left the emergency room.  She says she is seeing a chiropractor for her chronic bilateral foot neuropathy and it has helped. She is still not sleeping well and was prescribed a sleeping pill, Belsomra,  but she is afraid to take it due to the side effect of sleep paralysis.   Objective:   Review of Systems  Constitutional: Negative.     Physical Exam  Constitutional: She is oriented to person, place, and time. She appears well-developed and well-nourished.  Respiratory: Effort normal.  Neurological: She is alert and oriented to person, place, and time.  Skin: Skin is warm and dry.  Psychiatric: She has a normal mood and affect. Her behavior is normal. Judgment and thought content normal.   Filed Weights   03/15/16 1607  Weight: 225 lb 12.8 oz (102.4 kg)    Vitals:   03/15/16 1607  BP: 110/74    Encounter Medications:   Outpatient Encounter Prescriptions as of 03/15/2016  Medication Sig Note  . Exenatide ER 2 MG PEN Inject 2 mg into the skin once a week. 03/15/2016: Injects on Saturday  . atorvastatin (LIPITOR) 80 MG tablet Take 1 tablet (80 mg total) by mouth daily.   . BD PEN NEEDLE NANO U/F 32G X 4 MM MISC USE AS DIRECTED TO TEST BLOOD SUGAR TWICE DAILY DX: 250.02   . glucose blood test strip True test glucose test strip Test sugar at least twice daily (one fasting and one random post-prandial dx: E11.9   . Insulin Glargine (LANTUS SOLOSTAR) 100 UNIT/ML Solostar Pen Inject 50 Units into the skin 2 (two) times daily.   . Lancets MISC 3 (three) times daily. TRUEPLUS ULTRA THIN LANCETS TO TEST BLOOD SUGAR TWICE DAILY DX: 250.02   . lisinopril-hydrochlorothiazide (PRINZIDE,ZESTORETIC) 20-25 MG tablet Take 1 tablet by mouth daily.   . metFORMIN (GLUCOPHAGE) 1000 MG tablet Take 1 tablet (1,000 mg total) by mouth 2 (two) times daily.   . pregabalin (LYRICA) 75 MG capsule Take 1 capsule (75 mg total) by mouth 3 (three) times daily.   . ranitidine (ZANTAC) 150 MG tablet Take 150 mg by mouth daily as needed for heartburn.    . sertraline (ZOLOFT) 100 MG tablet Take 1 tablet (100 mg total) by mouth daily.   . Suvorexant (BELSOMRA)  10 MG TABS Take 10 mg by mouth at bedtime as needed (sleep).    No facility-administered encounter medications on file as of 03/15/2016.     Functional Status:   In your present state of health, do you have any difficulty performing the following activities: 03/15/2016  Hearing? N  Vision? N  Difficulty concentrating or making decisions? N  Walking or climbing stairs? N  Dressing or bathing? N  Doing errands, shopping? N  Preparing Food and eating ? N  Using the Toilet? N  In the past six months, have you accidently leaked urine? N  Do you have problems with loss of bowel control? N  Managing your Medications? N   Managing your Finances? N  Housekeeping or managing your Housekeeping? N  Some recent data might be hidden    Fall/Depression Screening:    PHQ 2/9 Scores 08/18/2015  Exception Documentation Patient refusal    Assessment:    Plan:  [facility] CM Care Plan Problem One   Flowsheet Row Most Recent Value  Care Plan Problem One Type II DM with poor self management skills due to decreased motivation as evidenced by chronically elevated Hgb A1C (11.0% on 02/03/16) , HTN and meeting treatment targets of <140/<90 and hyperlipidemia with lipid profile of 03/26/14 showing total cholesterol = 142, triglycerides= 424 and HDL= 31.8, chronic morbid obesity  Role Documenting the Problem One  Care Management Coordinator  Care Plan for Problem One  Active  [facility] Long Term Goal (31-90 days) Improved glycemic control as evidenced by improved Hgb A1C. ongoing good control of HTN as evidenced by blood pressure readings <140/<90,  Improved lipid profile at next assessment and evidence of weight loss or no weight gain at next assessment  [facility] Long Term Goal Start Date  03/15/16  Interventions for Problem One Long Term Goal discussed ongoing medication adherence barriers, reviewed medications and medication adherence , discussed the mechanism of action of Bydureon and provided handout, reviewed nutrition counseling benefit offered by [facility] and encouraged [name] to meet with an RD to assist with weight management and improve glycemic control,  reviewed natural alternatives to treat panic attacks including foods high in antioxidants such as blueberries pecans, kale, Camomile herbal tea,Yoga, massage therapy, meditation, etc,  will arrange for Link To Wellness follow up in October     RNCM to fax today's office visit note to Dr. Okey Dupre. RNCM will meet quarterly and as needed with patient per Link To Wellness program guidelines to assist with Type II DM, HTN, hyperlipidemia and obesity self-management and assess patient's  progress toward mutually set goals.

## 2016-03-23 ENCOUNTER — Other Ambulatory Visit: Payer: Self-pay | Admitting: Internal Medicine

## 2016-03-23 MED FILL — LYRICA 75 MG CAPSULE: 75 | 90 days supply | Qty: 270 | Fill #0

## 2016-06-01 MED FILL — SERTRALINE HCL 100 MG TAB: 100 | 90 days supply | Qty: 90 | Fill #1

## 2016-06-01 MED FILL — LANTUS SOLOSTAR 100 UNITS/M: 100 | 60 days supply | Qty: 60 | Fill #0

## 2016-06-01 MED FILL — LISINOPRIL-HCTZ 20-25 MG TA: 20-25 | 90 days supply | Qty: 90 | Fill #1

## 2016-06-12 ENCOUNTER — Other Ambulatory Visit: Payer: Self-pay | Admitting: *Deleted

## 2016-06-12 NOTE — Patient Outreach (Signed)
Secure e-[facility] on 10/23 at 3:17 pm with request to schedule Link To Wellness office visit. Await response from [facility].  Bary RichardJanet S. Hauser RN,CCM,CDE [facility] Care Management Coordinator Link To Wellness Office Phone [phone] Office Fax [phone]

## 2016-06-19 ENCOUNTER — Other Ambulatory Visit: Payer: Self-pay | Admitting: *Deleted

## 2016-06-19 NOTE — Patient Outreach (Signed)
[facility]) Care Management   06/19/2016  Donna [name] 08/27/1965 [id number]  Donna CliffLyndsey R Flippin is an 50 y.o. female who [facility] the [facility] Care Management office for routine Link To Wellness follow up for self management assistance with Type II DM with bilateral neuropathy, HTN, hyperlipidemia and obesity and for introduction and education about the [employer] Program.   Subjective: Donna [name] says she has not been back to her primary care provider,  Dr. Okey Duprerawford since 02/03/16. She says she stopped the Bydureon because she didn't like the way it makes her feel.  Donna [name] says she still struggles with remembering to take her medications and rarely checks her blood sugar. She says she is trying to adhere to a no white starch, no added sugar, meal plan and states she feels much better and is sleeping better.  She says she has had a rare mild panic attack that she was "able to talk herself out of". She says she does not need medication for them.  She says she continues to see a chiropractor for her chronic bilateral foot neuropathy and is benefiting from the therapy   helped.   Objective:   Review of Systems  Constitutional: Negative.   HENT: Ear pain:       Physical Exam  Constitutional: She is oriented to person, place, and time. She appears well-developed and well-nourished.  Respiratory: Effort normal.  Neurological: She is alert and oriented to person, place, and time.  Skin: Skin is warm and dry.  Psychiatric: She has a normal mood and affect. Her behavior is normal. Judgment and thought content normal.   There were no vitals filed for this visit. There were no vitals filed for this visit.  Encounter Medications:   Outpatient Encounter Prescriptions as of 06/19/2016  Medication Sig Note  . atorvastatin (LIPITOR) 80 MG tablet Take 1 tablet (80 mg total) by mouth daily.   Marland Kitchen. LANTUS SOLOSTAR 100 UNIT/ML Solostar Pen INJECT 50  UNITS INTO THE SKIN 2 TIMES DAILY.   Marland Kitchen. lisinopril-hydrochlorothiazide (PRINZIDE,ZESTORETIC) 20-25 MG tablet Take 1 tablet by mouth daily.   . metFORMIN (GLUCOPHAGE) 1000 MG tablet Take 1 tablet (1,000 mg total) by mouth 2 (two) times daily.   . pregabalin (LYRICA) 75 MG capsule Take 1 capsule (75 mg total) by mouth 3 (three) times daily. 06/19/2016: Takes one tablet  . ranitidine (ZANTAC) 150 MG tablet Take 150 mg by mouth daily as needed for heartburn.    . sertraline (ZOLOFT) 100 MG tablet Take 1 tablet (100 mg total) by mouth daily.   . sitaGLIPtin (JANUVIA) 100 MG tablet Take 100 mg by mouth daily.   . BD PEN NEEDLE NANO U/F 32G X 4 MM MISC USE AS DIRECTED TO TEST BLOOD SUGAR TWICE DAILY DX: 250.02   . Exenatide ER 2 MG PEN Inject 2 mg into the skin once a week. (Patient not taking: Reported on 06/19/2016)   . glucose blood test strip True test glucose test strip Test sugar at least twice daily (one fasting and one random post-prandial dx: E11.9   . Lancets MISC 3 (three) times daily. TRUEPLUS ULTRA THIN LANCETS TO TEST BLOOD SUGAR TWICE DAILY DX: 250.02   . Suvorexant (BELSOMRA) 10 MG TABS Take 10 mg by mouth at bedtime as needed (sleep). (Patient not taking: Reported on 06/19/2016)    No facility-administered encounter medications on file as of 06/19/2016.     Functional Status:   In your present state of health,  do you have any difficulty performing the following activities: 03/15/2016  Hearing? N  Vision? N  Difficulty concentrating or making decisions? N  Walking or climbing stairs? N  Dressing or bathing? N  Doing errands, shopping? N  Preparing Food and eating ? N  Using the Toilet? N  In the past six months, have you accidently leaked urine? N  Do you have problems with loss of bowel control? N  Managing your Medications? N  Managing your Finances? N  Housekeeping or managing your Housekeeping? 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  Plan:  [facility]   Flowsheet Row Most Recent Value  Care Plan Problem One Type II DM with chronically elevated Hgb A1C (11.0% on 02/03/16) , HTN and meeting treatment targets of <140/<90 and hyperlipidemia with lipid profile of 03/26/14 showing total cholesterol = 142, triglycerides= 424 and HDL= 31.8, chronic morbid obesity  Role Documenting the Problem One  Care Management Coordinator  Care Plan for Problem One  Active  [facility] Long Term Goal (31-90 days) Improved glycemic control as evidenced by improved Hgb A1C. ongoing good control of HTN as evidenced by blood pressure readings <140/<90, improved lipid profile at next assessment and evidence of weight loss or no weight gain at next assessment  [facility] Long Term Goal Start Date  06/19/16  Interventions for Problem One Long Term Goal Reviewed DM self management skills, congratulated Donna [name] on adhering to a CHO controlled, no added sugar meal plan, verified eligibility then educated her to the [employer] Program and advised her to enroll, explained that [employer] will replace the [facility] program starting in 2018     RNCM to fax today's office visit note to Dr. Okey Duprerawford.  Bary RichardJanet S. Akira Adelsberger RN,CCM,CDE [facility] Care Management Coordinator Link To Wellness Office Phone [phone] Office Fax [phone]

## 2016-07-26 ENCOUNTER — Other Ambulatory Visit: Payer: Self-pay | Admitting: Internal Medicine

## 2016-07-27 MED FILL — UNIFINE PENTIPS 32GX5/32": 32G X 4 MM | 44 days supply | Qty: 100 | Fill #0

## 2016-07-27 MED FILL — UNIFINE PENTIPS 32GX5/32: 32G X 4 MM | 44 days supply | Qty: 100 | Fill #0

## 2016-07-31 NOTE — Telephone Encounter (Signed)
She needs to make visit, overdue. Also not supposed to be taking [place]ajanuvia so denied.

## 2016-08-03 NOTE — Telephone Encounter (Signed)
Tried to reach patient, no answer

## 2016-09-13 ENCOUNTER — Other Ambulatory Visit (INDEPENDENT_AMBULATORY_CARE_PROVIDER_SITE_OTHER): Payer: 59

## 2016-09-13 ENCOUNTER — Encounter: Payer: Self-pay | Admitting: Internal Medicine

## 2016-09-13 ENCOUNTER — Ambulatory Visit (INDEPENDENT_AMBULATORY_CARE_PROVIDER_SITE_OTHER): Payer: 59 | Admitting: Internal Medicine

## 2016-09-13 VITALS — BP 124/80 | HR 93 | Temp 98.2°F | Resp 16 | Ht 66.0 in | Wt 221.5 lb

## 2016-09-13 DIAGNOSIS — E1141 Type 2 diabetes mellitus with diabetic mononeuropathy: Secondary | ICD-10-CM | POA: Diagnosis not present

## 2016-09-13 DIAGNOSIS — E1165 Type 2 diabetes mellitus with hyperglycemia: Secondary | ICD-10-CM

## 2016-09-13 DIAGNOSIS — E1140 Type 2 diabetes mellitus with diabetic neuropathy, unspecified: Secondary | ICD-10-CM

## 2016-09-13 DIAGNOSIS — Z794 Long term (current) use of insulin: Secondary | ICD-10-CM

## 2016-09-13 DIAGNOSIS — IMO0002 Reserved for concepts with insufficient information to code with codable children: Secondary | ICD-10-CM

## 2016-09-13 DIAGNOSIS — N950 Postmenopausal bleeding: Secondary | ICD-10-CM | POA: Diagnosis not present

## 2016-09-13 LAB — CBC
HCT: 42.9 % (ref 36.0–46.0)
HEMOGLOBIN: 15 g/dL (ref 12.0–15.0)
MCHC: 35.1 g/dL (ref 30.0–36.0)
MCV: 89 fl (ref 78.0–100.0)
PLATELETS: 316 10*3/uL (ref 150.0–400.0)
RBC: 4.82 Mil/uL (ref 3.87–5.11)
RDW: 12.3 % (ref 11.5–15.5)
WBC: 8.5 10*3/uL (ref 4.0–10.5)

## 2016-09-13 LAB — COMPREHENSIVE METABOLIC PANEL
ALT: 23 U/L (ref 0–35)
AST: 18 U/L (ref 0–37)
Albumin: 4.2 g/dL (ref 3.5–5.2)
Alkaline Phosphatase: 107 U/L (ref 39–117)
BILIRUBIN TOTAL: 0.4 mg/dL (ref 0.2–1.2)
BUN: 13 mg/dL (ref 6–23)
CALCIUM: 9.2 mg/dL (ref 8.4–10.5)
CHLORIDE: 99 meq/L (ref 96–112)
CO2: 25 meq/L (ref 19–32)
Creatinine, Ser: 0.66 mg/dL (ref 0.40–1.20)
GFR: 100.26 mL/min (ref >60.00)
GLUCOSE: 357 mg/dL — AB (ref 70–99)
Potassium: 4.2 mEq/L (ref 3.5–5.1)
Sodium: 134 mEq/L — ABNORMAL LOW (ref 135–145)
Total Protein: 7.4 g/dL (ref 6.0–8.3)

## 2016-09-13 LAB — HEMOGLOBIN A1C

## 2016-09-13 LAB — LIPID PANEL
CHOL/HDL RATIO: 4
CHOLESTEROL: 181 mg/dL (ref 0–200)
HDL: 46.7 mg/dL (ref >39.00)

## 2016-09-13 LAB — LDL CHOLESTEROL, DIRECT: Direct LDL: 103 mg/dL

## 2016-09-13 NOTE — Progress Notes (Signed)
Pre visit review using our clinic review tool, if applicable. No additional management support is needed unless otherwise documented below in the visit note. 

## 2016-09-13 NOTE — Patient Instructions (Signed)
Jardiance, farxiga are the options for the other medicine for the diabetes.   Januvia will be sent in as well in case we need to go back on that.

## 2016-09-13 NOTE — Progress Notes (Signed)
   Subjective:    Patient ID: Donna [name], female    DOB: 10/19/1965, 51 y.o.   MRN: [id number]  HPI The patient is a 51 YO female coming in for uncontrolled diabetes. She has stopped taking bydureon without letting us know since last visit. She has not followed up as advised. She is likely having high sugars but she is not checking her sugars at this time. She is taking her metformin at this time and lantus 50 units BID.   Review of Systems  Constitutional: Positive for activity change and appetite change. Negative for chills, fatigue, fever and unexpected weight change.  Respiratory: Negative.   Cardiovascular: Negative.   Gastrointestinal: Negative.   Musculoskeletal: Positive for myalgias.  Skin: Negative.   Neurological: Positive for numbness. Negative for dizziness, syncope, speech difficulty, weakness and light-headedness.      Objective:   Physical Exam  Constitutional: She appears well-developed and well-nourished.  Overweight  HENT:  Head: Normocephalic and atraumatic.  Eyes: EOM are normal.  Neck: Normal range of motion.  Cardiovascular: Normal rate and regular rhythm.   Pulmonary/Chest: Effort normal. No respiratory distress. She has no wheezes. She has no rales.  Abdominal: Soft.  Neurological: A cranial nerve deficit is present.  No sensation in her feet to mid shins.   Skin: Skin is warm and dry.   Vitals:   09/13/16 1058  BP: 124/80  Pulse: 93  Resp: 16  Temp: 98.2 F (36.8 C)  TempSrc: Oral  SpO2: 99%  Weight: 221 lb 8 oz (100.5 kg)  Height: 5\' 6"  (1.676 m)      Assessment & Plan:

## 2016-09-14 MED ORDER — EMPAGLIFLOZIN 25 MG PO TABS: 25.0000 mg | ORAL_TABLET | Freq: Every day | ORAL | 0 refills | Status: DC

## 2016-09-14 NOTE — Assessment & Plan Note (Signed)
Stable but with her uncontrolled sugars the pain is worse. Takes lyrica already.

## 2016-09-14 NOTE — Assessment & Plan Note (Addendum)
Taking lantus 50 units BID, metformin. Advised that [place]ajanuvia is not very strong compared to bydureon which she stopped due to GI side effects. She will start jardiance. We discussed endocrine and she declines at this time. We also discussed mealtime insulin but she declines as well. Talked to her about the fact that her diabetes is likely not going to be controlled by this and that her neuropathy could be get or she could get other complications. Reminded about the strong need for eye exam.

## 2016-09-28 ENCOUNTER — Other Ambulatory Visit: Payer: Self-pay | Admitting: Internal Medicine

## 2016-09-28 ENCOUNTER — Telehealth: Payer: Self-pay

## 2016-09-28 ENCOUNTER — Encounter: Payer: Self-pay | Admitting: Internal Medicine

## 2016-09-28 MED FILL — LISINOPRIL-HCTZ 20-25 MG TA: 20-25 | 90 days supply | Qty: 90 | Fill #0

## 2016-09-28 MED FILL — metFORMIN HCL 1000 MG TABS: 1000 | 90 days supply | Qty: 180 | Fill #1

## 2016-09-28 NOTE — Telephone Encounter (Signed)
PA started for jardiance Key: [id number]

## 2016-10-08 ENCOUNTER — Ambulatory Visit: Payer: Self-pay | Admitting: Gynecology

## 2016-10-09 ENCOUNTER — Telehealth: Payer: Self-pay | Admitting: *Deleted

## 2016-10-09 MED ORDER — ACCU-CHEK AVIVA PLUS W/DEVICE KIT: PACK | 0 refills | Status: DC

## 2016-10-09 MED ORDER — GLUCOSE BLOOD VI STRP: 1.0000 | ORAL_STRIP | Freq: Two times a day (BID) | 3 refills | Status: DC

## 2016-10-09 MED ORDER — ACCU-CHEK FASTCLIX LANCETS MISC: 3 refills | Status: DC

## 2016-10-09 NOTE — Telephone Encounter (Signed)
Rec'd fax starting new insurance requires [employer] will no longer cover True test. Updated chart sent [facility] Monitor [facility]...lmb

## 2016-10-16 ENCOUNTER — Other Ambulatory Visit: Payer: Self-pay | Admitting: Internal Medicine

## 2016-10-19 ENCOUNTER — Ambulatory Visit: Payer: Self-pay | Admitting: Gynecology

## 2016-10-19 DIAGNOSIS — Z0289 Encounter for other administrative examinations: Secondary | ICD-10-CM

## 2016-10-26 ENCOUNTER — Other Ambulatory Visit: Payer: Self-pay | Admitting: Internal Medicine

## 2016-10-26 MED FILL — JARDIANCE 25 MG TABLET: 25 | 30 days supply | Qty: 30 | Fill #0

## 2016-10-26 MED FILL — SERTRALINE HCL 100 MG TAB: 100 | 90 days supply | Qty: 90 | Fill #0

## 2016-10-26 MED FILL — LANTUS SOLOSTAR 100 UNITS/M: 100 | 30 days supply | Qty: 30 | Fill #0

## 2016-12-31 DIAGNOSIS — H5213 Myopia, bilateral: Secondary | ICD-10-CM | POA: Diagnosis not present

## 2017-01-02 ENCOUNTER — Encounter: Payer: Self-pay | Admitting: Gynecology

## 2017-01-10 ENCOUNTER — Other Ambulatory Visit: Payer: Self-pay | Admitting: Internal Medicine

## 2017-01-10 MED FILL — LISINOPRIL-HCTZ 20-25 MG TA: 20-25 | 90 days supply | Qty: 90 | Fill #1

## 2017-01-10 MED FILL — LANTUS SOLOSTAR 100 UNITS/M: 100 | 30 days supply | Qty: 30 | Fill #1

## 2017-01-10 MED FILL — ATORVASTATIN 80 MG TABLET: 80 | 90 days supply | Qty: 90 | Fill #1

## 2017-01-23 ENCOUNTER — Other Ambulatory Visit (INDEPENDENT_AMBULATORY_CARE_PROVIDER_SITE_OTHER): Payer: 59

## 2017-01-23 ENCOUNTER — Encounter: Payer: Self-pay | Admitting: Internal Medicine

## 2017-01-23 ENCOUNTER — Ambulatory Visit (INDEPENDENT_AMBULATORY_CARE_PROVIDER_SITE_OTHER): Payer: 59 | Admitting: Internal Medicine

## 2017-01-23 VITALS — BP 134/80 | HR 100 | Temp 99.2°F | Resp 12 | Ht 66.0 in | Wt 219.0 lb

## 2017-01-23 DIAGNOSIS — E1165 Type 2 diabetes mellitus with hyperglycemia: Secondary | ICD-10-CM

## 2017-01-23 DIAGNOSIS — IMO0002 Reserved for concepts with insufficient information to code with codable children: Secondary | ICD-10-CM

## 2017-01-23 DIAGNOSIS — Z794 Long term (current) use of insulin: Secondary | ICD-10-CM | POA: Diagnosis not present

## 2017-01-23 DIAGNOSIS — E1140 Type 2 diabetes mellitus with diabetic neuropathy, unspecified: Secondary | ICD-10-CM

## 2017-01-23 LAB — HEMOGLOBIN A1C: Hgb A1c MFr Bld: 12.3 % — ABNORMAL HIGH (ref 4.6–6.5)

## 2017-01-23 MED ORDER — GABAPENTIN 300 MG PO CAPS: 300.0000 mg | ORAL_CAPSULE | Freq: Three times a day (TID) | ORAL | 3 refills | Status: DC

## 2017-01-23 MED ORDER — DULAGLUTIDE 1.5 MG/0.5ML ~~LOC~~ SOAJ: 1.5000 mg | SUBCUTANEOUS | 11 refills | Status: DC

## 2017-01-23 MED FILL — GABAPENTIN 300 MG CAPSULE: 300 | 30 days supply | Qty: 90 | Fill #0

## 2017-01-23 MED FILL — TRULICITY 1.5 MG/0.5 ML PEN: 1.5 | 28 days supply | Qty: 2 | Fill #0

## 2017-01-23 NOTE — Patient Instructions (Signed)
We are sending in the gabapentin, start off taking 1 pill daily, after 2 days you can increase to 1 pill twice a day, then after 2 days you can increase to 1 pill 3 times a day.   We have sent in the trulicity which is the once a week shot.

## 2017-01-23 NOTE — Progress Notes (Signed)
   Subjective:    Patient ID: Donna BollmanLyndsey R [name], female    DOB: 04/11/1966, 51 y.o.   MRN: [id number]  HPI The patient is a 51 YO female coming in for uncontrolled diabetes. She has not followed up as soon as recommended. She was started on jardiance at last visit but she did not take and is taking metformin and lantus 50 units BID. Not checking her sugars right now. She is interested in taking trulicity although she did stop taking bydureon due to perception of side effects. She is not checking her sugars. Pain in her feet which is slightly worse. She declines the need for more medication for that. Denies infection. She is struggling with meal and does not exercise. She knows what she needs to do but feels poorly from high sugars and this keeps her from being motivated to change.   Review of Systems  Constitutional: Negative for activity change, appetite change, fatigue, fever and unexpected weight change.  HENT: Negative.   Eyes: Negative.   Respiratory: Negative.   Cardiovascular: Negative.   Gastrointestinal: Positive for abdominal pain and nausea.  Musculoskeletal: Negative.   Skin: Negative.   Neurological: Positive for numbness. Negative for dizziness, syncope, facial asymmetry, weakness and headaches.  Psychiatric/Behavioral: Negative.       Objective:   Physical Exam  Constitutional: She is oriented to person, place, and time. She appears well-developed and well-nourished.  HENT:  Head: Normocephalic and atraumatic.  Eyes: EOM are normal.  Neck: Normal range of motion.  Cardiovascular: Normal rate and regular rhythm.   Pulmonary/Chest: Effort normal and breath sounds normal.  Abdominal: Soft. Bowel sounds are normal. She exhibits no distension. There is no tenderness. There is no rebound.  Neurological: She is alert and oriented to person, place, and time.  Skin: Skin is warm and dry.  Psychiatric: She has a normal mood and affect.  Some disconnect from her disease and the  seriousness.    Vitals:   01/23/17 1530  BP: 134/80  Pulse: 100  Resp: 12  Temp: 99.2 F (37.3 C)  TempSrc: Oral  SpO2: 99%  Weight: 219 lb (99.3 kg)  Height: 5\' 6"  (1.676 m)      Assessment & Plan:

## 2017-01-25 NOTE — Assessment & Plan Note (Signed)
Taking lantus 50 units BID and not checking sugars. She does not want to increase. Wants to try trulicity which we will do to try to get her connected with her health long term. She is aware of the serious complications of diabetes and relative with foot loss from diabetes and she works in health care. She is just feeling poorly from sugars and is having a hard time making a change. We talked about her returning to endo but she is not willing to do that. Talked about mealtime insulin as the next reasonable option but since she does not want to check sugars this is not a great option and she is not enthusiastic about this. Last HgA1c around 11 and not likely to be improved given that she has not made recommended changes since last time. We discussed the serious nature of her condition and the likelihood progression to complications with her longstanding uncontrolled sugars and she understands.

## 2017-01-27 IMAGING — CR DG CHEST 2V
2 series · 2 of 2 positions shown · non-contrast
Comparison: Chest radiograph performed 01/22/2008

CLINICAL DATA: Acute onset of shortness of breath. Initial
encounter.

EXAM:
CHEST  2 VIEW

[w chest pa]
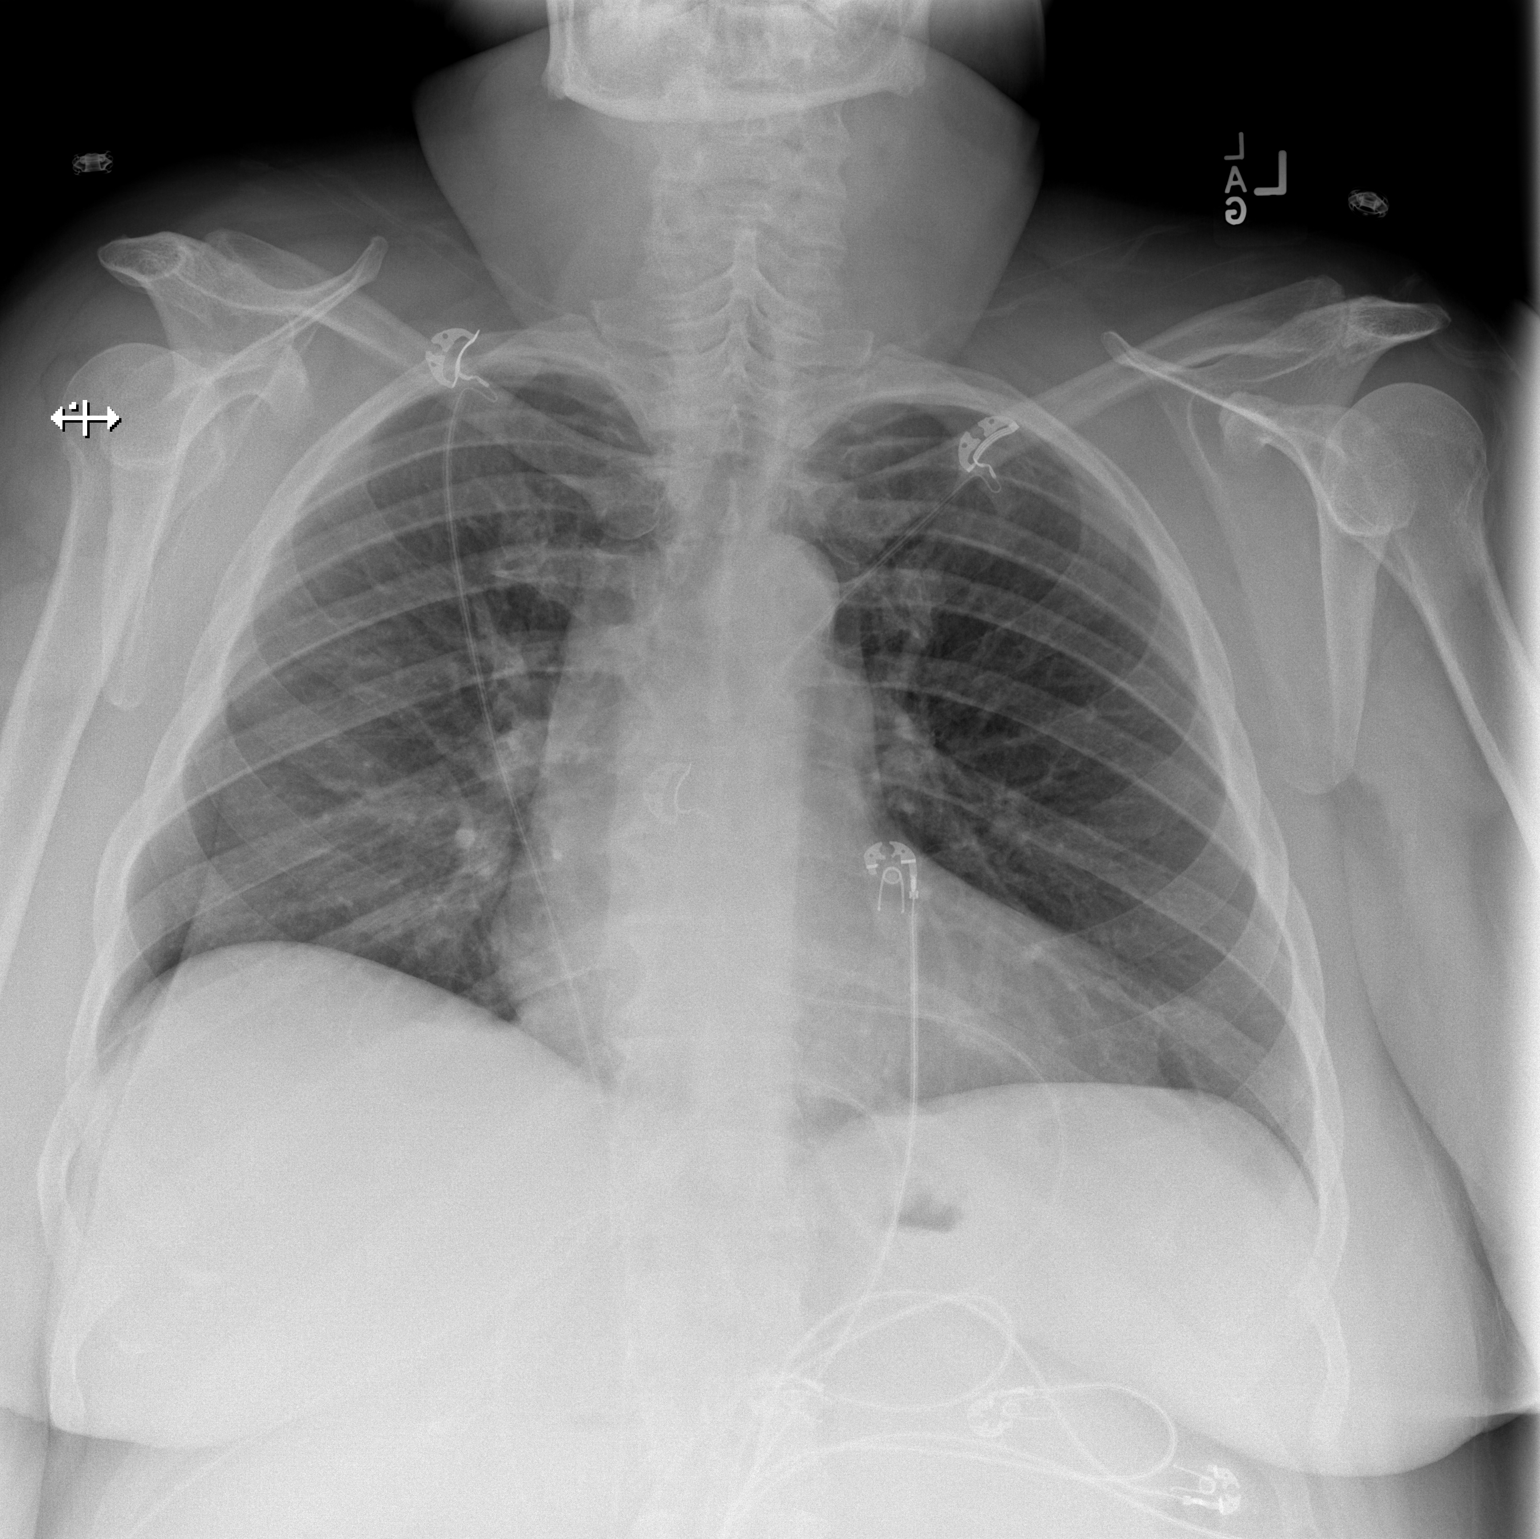

[w chest lat]
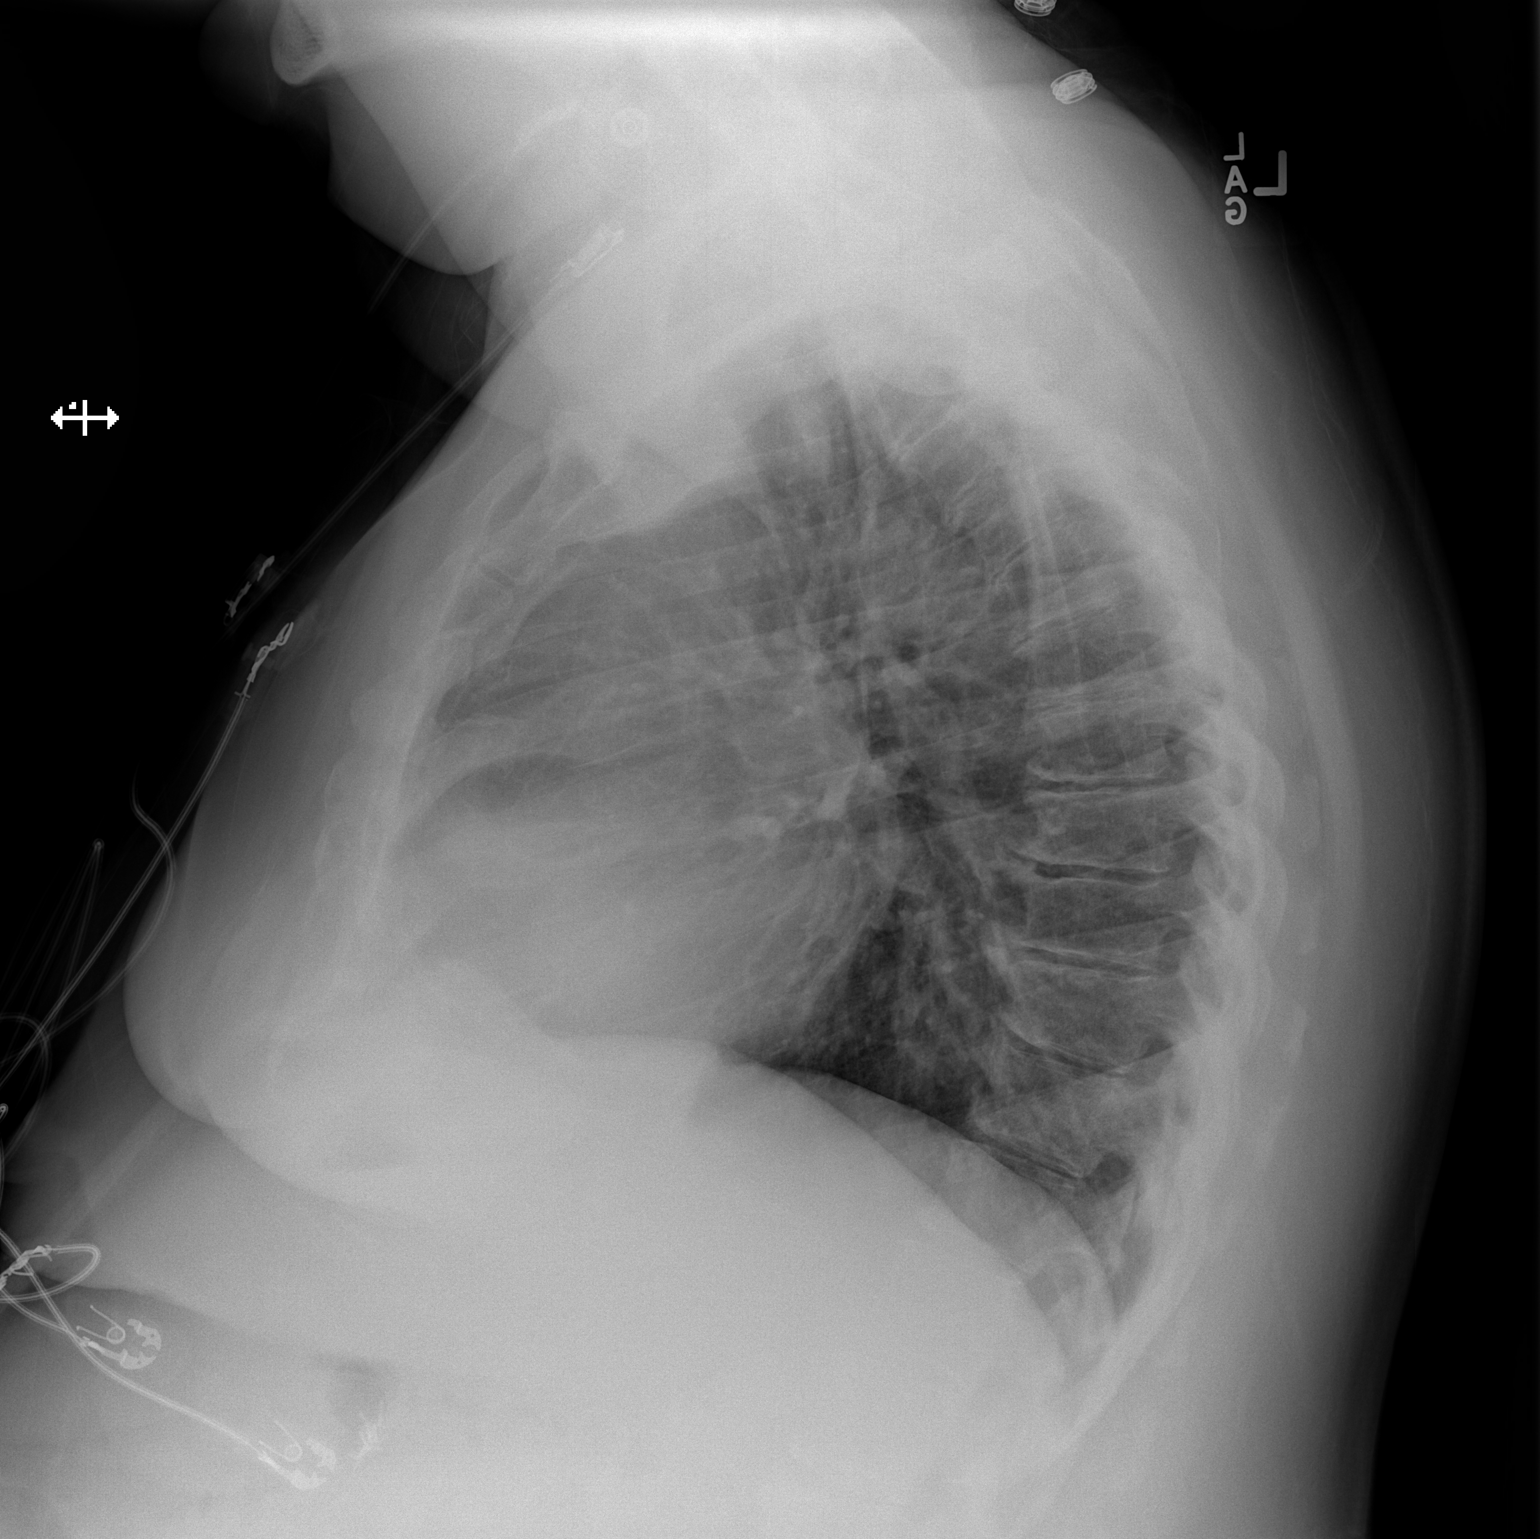

[2 of 2 positions shown; findings below may reference images not displayed]

FINDINGS: The lungs are well-aerated and clear. There is no evidence of focal
opacification, pleural effusion or pneumothorax.

The heart is normal in size; the mediastinal contour is within
normal limits. No acute osseous abnormalities are seen. Clips are
noted within the right upper quadrant, reflecting prior
cholecystectomy.
IMPRESSION: No acute cardiopulmonary process seen.

## 2017-02-28 ENCOUNTER — Other Ambulatory Visit: Payer: Self-pay | Admitting: Internal Medicine

## 2017-02-28 MED FILL — GABAPENTIN 300 MG CAPSULE: 300 | 30 days supply | Qty: 90 | Fill #1

## 2017-02-28 MED FILL — SERTRALINE HCL 100 MG TAB: 100 | 90 days supply | Qty: 90 | Fill #1

## 2017-02-28 MED FILL — LANTUS SOLOSTAR 100 UNITS/M: 100 | 15 days supply | Qty: 15 | Fill #0

## 2017-04-01 ENCOUNTER — Other Ambulatory Visit: Payer: Self-pay | Admitting: Internal Medicine

## 2017-04-01 MED FILL — LANTUS SOLOSTAR 100 UNITS/M: 100 | 15 days supply | Qty: 15 | Fill #1

## 2017-04-01 MED FILL — GABAPENTIN 300 MG CAPSULE: 300 | 30 days supply | Qty: 90 | Fill #2

## 2017-04-01 MED FILL — UNIFINE PENTIPS 32GX5/32: 32G X 4 MM | 50 days supply | Qty: 100 | Fill #0

## 2017-04-01 MED FILL — UNIFINE PENTIPS 32GX5/32": 32G X 4 MM | 50 days supply | Qty: 100 | Fill #0

## 2017-04-02 MED FILL — metFORMIN HCL 1000 MG TABS: 1000 | 90 days supply | Qty: 180 | Fill #0

## 2017-04-03 MED FILL — JARDIANCE 25 MG TABLET: 25 | 30 days supply | Qty: 30 | Fill #1

## 2017-04-17 MED FILL — TRULICITY 1.5 MG/0.5 ML PEN: 1.5 | 84 days supply | Qty: 6 | Fill #1

## 2017-05-28 ENCOUNTER — Other Ambulatory Visit: Payer: Self-pay | Admitting: Internal Medicine

## 2017-05-28 MED FILL — LANTUS SOLOSTAR 100 UNITS/M: 100 | 15 days supply | Qty: 15 | Fill #2

## 2017-05-28 MED FILL — JARDIANCE 25 MG TABLET: 25 | 30 days supply | Qty: 30 | Fill #2

## 2017-05-28 MED FILL — LISINOPRIL-HCTZ 20-25 MG TA: 20-25 | 90 days supply | Qty: 90 | Fill #0

## 2017-05-28 MED FILL — GABAPENTIN 300 MG CAPSULE: 300 | 30 days supply | Qty: 90 | Fill #3

## 2017-06-27 ENCOUNTER — Encounter: Payer: Self-pay | Admitting: Internal Medicine

## 2017-06-27 ENCOUNTER — Ambulatory Visit (INDEPENDENT_AMBULATORY_CARE_PROVIDER_SITE_OTHER): Payer: 59 | Admitting: Internal Medicine

## 2017-06-27 ENCOUNTER — Other Ambulatory Visit (INDEPENDENT_AMBULATORY_CARE_PROVIDER_SITE_OTHER): Payer: 59

## 2017-06-27 VITALS — BP 118/78 | HR 98 | Temp 99.0°F | Ht 66.0 in | Wt 215.0 lb

## 2017-06-27 DIAGNOSIS — IMO0002 Reserved for concepts with insufficient information to code with codable children: Secondary | ICD-10-CM

## 2017-06-27 DIAGNOSIS — Z1239 Encounter for other screening for malignant neoplasm of breast: Secondary | ICD-10-CM

## 2017-06-27 DIAGNOSIS — Z1231 Encounter for screening mammogram for malignant neoplasm of breast: Secondary | ICD-10-CM | POA: Diagnosis not present

## 2017-06-27 DIAGNOSIS — E1149 Type 2 diabetes mellitus with other diabetic neurological complication: Secondary | ICD-10-CM

## 2017-06-27 DIAGNOSIS — E1165 Type 2 diabetes mellitus with hyperglycemia: Secondary | ICD-10-CM

## 2017-06-27 LAB — HEMOGLOBIN A1C: Hgb A1c MFr Bld: 10.7 % — ABNORMAL HIGH (ref 4.6–6.5)

## 2017-06-27 MED ORDER — GABAPENTIN 300 MG PO CAPS: 600.0000 mg | ORAL_CAPSULE | Freq: Three times a day (TID) | ORAL | 6 refills | Status: DC

## 2017-06-27 MED ORDER — INSULIN GLARGINE 100 UNIT/ML SOLOSTAR PEN: 50.0000 [IU] | PEN_INJECTOR | Freq: Two times a day (BID) | SUBCUTANEOUS | 1 refills | Status: DC

## 2017-06-27 MED ORDER — JARDIANCE 25 MG PO TABS: 25.0000 mg | ORAL_TABLET | Freq: Every day | ORAL | 1 refills | Status: DC

## 2017-06-27 MED ORDER — GABAPENTIN 300 MG PO CAPS: 300.0000 mg | ORAL_CAPSULE | Freq: Three times a day (TID) | ORAL | 3 refills | Status: DC

## 2017-06-27 MED ORDER — AMITRIPTYLINE HCL 150 MG PO TABS: 150.0000 mg | ORAL_TABLET | Freq: Every day | ORAL | 1 refills | Status: DC

## 2017-06-27 MED FILL — LANTUS SOLOSTAR 100 UNITS/M: 100 | 90 days supply | Qty: 90 | Fill #0

## 2017-06-27 MED FILL — JARDIANCE 25 MG TABLET: 25 | 90 days supply | Qty: 90 | Fill #0

## 2017-06-27 MED FILL — AMITRIPTYLINE HCL 150 MG TA: 150 | 90 days supply | Qty: 90 | Fill #0

## 2017-06-27 MED FILL — GABAPENTIN 300 MG CAPSULE: 300 | 30 days supply | Qty: 90 | Fill #0

## 2017-06-27 NOTE — Progress Notes (Signed)
   Subjective:    Patient ID: Donna [name], female    DOB: 10/19/1965, [age] y.o.   MRN: [id number]  HPI The patient is a 51 YO female coming in for follow up of her uncontrolled diabetes (taking lantus 50 units BID and jardiance, stopped taking trulicity as she did not like how it made her feel, neuropathy in her feet which is worsening, wants to increase her gabapentin). [place] cannot remember the name.   [place] [facility] doctor  Review of Systems  Constitutional: Negative.   Respiratory: Negative for cough, chest tightness and shortness of breath.   Cardiovascular: Negative for chest pain, palpitations and leg swelling.  Gastrointestinal: Negative for abdominal distention, abdominal pain, constipation, diarrhea, nausea and vomiting.  Musculoskeletal: Negative.   Skin: Negative.   Neurological: Positive for numbness. Negative for dizziness, syncope, weakness and headaches.  Psychiatric/Behavioral: Negative.       Objective:   Physical Exam  Constitutional: She is oriented to person, place, and time. She appears well-developed and well-nourished.  HENT:  Head: Normocephalic and atraumatic.  Eyes: EOM are normal.  Neck: Normal range of motion.  Cardiovascular: Normal rate and regular rhythm.  Pulmonary/Chest: Effort normal and breath sounds normal. No respiratory distress. She has no wheezes. She has no rales.  Abdominal: Soft. Bowel sounds are normal. She exhibits no distension. There is no tenderness. There is no rebound.  Musculoskeletal: She exhibits no edema.  Neurological: She is alert and oriented to person, place, and time. Coordination normal.  Skin: Skin is warm and dry.  Psychiatric: She has a normal mood and affect.   Vitals:   06/27/17 1447  BP: 118/78  Pulse: 98  Temp: 99 F (37.2 C)  TempSrc: Oral  SpO2: 98%  Weight: 215 lb (97.5 kg)  Height: 5\' 6"  (1.676 m)      Assessment & Plan:

## 2017-06-27 NOTE — Patient Instructions (Signed)
Come back for the physical.

## 2017-06-28 ENCOUNTER — Other Ambulatory Visit: Payer: Self-pay | Admitting: Internal Medicine

## 2017-06-28 MED ORDER — PIOGLITAZONE HCL 30 MG PO TABS: 30.0000 mg | ORAL_TABLET | Freq: Every day | ORAL | 1 refills | Status: DC

## 2017-06-28 NOTE — Assessment & Plan Note (Signed)
Checking HgA1c and adjust as needed. Will add actos if needed. She declines multiple injections per day and does not check sugars often. Complicated by neuropathy which is worsening. Increasing gabapentin to 600 mg TID. Foot exam done.

## 2017-07-17 ENCOUNTER — Other Ambulatory Visit: Payer: Self-pay | Admitting: Internal Medicine

## 2017-07-17 ENCOUNTER — Ambulatory Visit
Admission: RE | Admit: 2017-07-17 | Discharge: 2017-07-17 | Disposition: A | Discharge status: Home / Self Care | Payer: 59 | Source: Ambulatory Visit | Attending: Internal Medicine | Admitting: Internal Medicine

## 2017-07-17 DIAGNOSIS — Z1231 Encounter for screening mammogram for malignant neoplasm of breast: Secondary | ICD-10-CM

## 2017-07-18 ENCOUNTER — Other Ambulatory Visit: Payer: Self-pay | Admitting: *Deleted

## 2017-07-18 NOTE — Patient Outreach (Signed)
Jaeleigh transitioned from the [employer] program to the [employer] digital assistant platform on 09/12/16 for Type II diabetes self-management assistance so will close case to the diabetes [facility] program due to delegation of disease management services to [employer] from [employer] [facility] for [employer] [facility] plan members services in 2019. Bary RichardJanet S. Hauser RN,CCM,CDE [facility] Care Management Coordinator [facility] and [employer] Office Phone [phone] Office Fax [phone]

## 2017-07-25 ENCOUNTER — Other Ambulatory Visit (INDEPENDENT_AMBULATORY_CARE_PROVIDER_SITE_OTHER): Payer: 59

## 2017-07-25 ENCOUNTER — Ambulatory Visit (INDEPENDENT_AMBULATORY_CARE_PROVIDER_SITE_OTHER): Payer: 59 | Admitting: Internal Medicine

## 2017-07-25 ENCOUNTER — Encounter: Payer: Self-pay | Admitting: Internal Medicine

## 2017-07-25 VITALS — BP 110/80 | HR 113 | Temp 98.6°F | Ht 66.0 in | Wt 212.0 lb

## 2017-07-25 DIAGNOSIS — E1141 Type 2 diabetes mellitus with diabetic mononeuropathy: Secondary | ICD-10-CM | POA: Diagnosis not present

## 2017-07-25 DIAGNOSIS — Z0000 Encounter for general adult medical examination without abnormal findings: Secondary | ICD-10-CM | POA: Diagnosis not present

## 2017-07-25 DIAGNOSIS — E785 Hyperlipidemia, unspecified: Secondary | ICD-10-CM | POA: Diagnosis not present

## 2017-07-25 DIAGNOSIS — E1169 Type 2 diabetes mellitus with other specified complication: Secondary | ICD-10-CM

## 2017-07-25 LAB — CBC
HCT: 45.2 % (ref 36.0–46.0)
Hemoglobin: 15.1 g/dL — ABNORMAL HIGH (ref 12.0–15.0)
MCHC: 33.5 g/dL (ref 30.0–36.0)
MCV: 91.9 fl (ref 78.0–100.0)
Platelets: 300 10*3/uL (ref 150.0–400.0)
RBC: 4.91 Mil/uL (ref 3.87–5.11)
RDW: 12 % (ref 11.5–15.5)
WBC: 9 10*3/uL (ref 4.0–10.5)

## 2017-07-25 LAB — COMPREHENSIVE METABOLIC PANEL
ALK PHOS: 76 U/L (ref 39–117)
ALT: 18 U/L (ref 0–35)
AST: 15 U/L (ref 0–37)
Albumin: 4.6 g/dL (ref 3.5–5.2)
BILIRUBIN TOTAL: 0.6 mg/dL (ref 0.2–1.2)
BUN: 13 mg/dL (ref 6–23)
CO2: 29 meq/L (ref 19–32)
Calcium: 9.2 mg/dL (ref 8.4–10.5)
Chloride: 98 mEq/L (ref 96–112)
Creatinine, Ser: 0.6 mg/dL (ref 0.40–1.20)
GFR: 111.54 mL/min (ref >60.00)
GLUCOSE: 101 mg/dL — AB (ref 70–99)
POTASSIUM: 3.6 meq/L (ref 3.5–5.1)
SODIUM: 136 meq/L (ref 135–145)
TOTAL PROTEIN: 7.4 g/dL (ref 6.0–8.3)

## 2017-07-25 LAB — LIPID PANEL
CHOL/HDL RATIO: 3
Cholesterol: 137 mg/dL (ref 0–200)
HDL: 44.2 mg/dL (ref >39.00)
LDL Cholesterol: 58 mg/dL (ref 0–99)
NonHDL: 92.61
Triglycerides: 174 mg/dL — ABNORMAL HIGH (ref 0.0–149.0)
VLDL: 34.8 mg/dL (ref 0.0–40.0)

## 2017-07-25 LAB — VITAMIN D 25 HYDROXY (VIT D DEFICIENCY, FRACTURES): VITD: 17.83 ng/mL — AB (ref 30.00–100.00)

## 2017-07-25 LAB — VITAMIN B12: VITAMIN B 12: 593 pg/mL (ref 211–911)

## 2017-07-25 LAB — T4, FREE: FREE T4: 0.82 ng/dL (ref 0.60–1.60)

## 2017-07-25 LAB — TSH: TSH: 1.05 u[IU]/mL (ref 0.35–4.50)

## 2017-07-25 NOTE — Patient Instructions (Signed)

## 2017-07-25 NOTE — Assessment & Plan Note (Addendum)
Flu current, tetanus current, declines pneumonia shot. Thinking about cologuard. Mammogram and pap smear done. Counseled about sun safety and dangers of distracted driving. Given screening recommendations. Counseled about shingrix and she wishes to think about this.

## 2017-07-25 NOTE — Progress Notes (Signed)
   Subjective:    Patient ID: Donna [name], female    DOB: 04/26/1966, 51 y.o.   MRN: [id number]  HPI The patient is a 51 YO female coming in for physical. Elavil is helping much better for the feet.  [facility], social history reviewed and updated.   Review of Systems  Constitutional: Negative.   HENT: Negative.   Eyes: Negative.   Respiratory: Negative for cough, chest tightness and shortness of breath.   Cardiovascular: Negative for chest pain, palpitations and leg swelling.  Gastrointestinal: Negative for abdominal distention, abdominal pain, constipation, diarrhea, nausea and vomiting.  Musculoskeletal: Negative.   Skin: Negative.   Neurological: Negative.   Psychiatric/Behavioral: Negative.       Objective:   Physical Exam  Constitutional: She is oriented to person, place, and time. She appears well-developed and well-nourished.  HENT:  Head: Normocephalic and atraumatic.  Eyes: EOM are normal.  Neck: Normal range of motion.  Cardiovascular: Normal rate and regular rhythm.  Pulmonary/Chest: Effort normal and breath sounds normal. No respiratory distress. She has no wheezes. She has no rales.  Abdominal: Soft. Bowel sounds are normal. She exhibits no distension. There is no tenderness. There is no rebound.  Musculoskeletal: She exhibits no edema.  Neurological: She is alert and oriented to person, place, and time. Coordination normal.  Skin: Skin is warm and dry.  Psychiatric: She has a normal mood and affect.   Vitals:   07/25/17 1458  BP: 110/80  Pulse: (!) 113  Temp: 98.6 F (37 C)  TempSrc: Oral  SpO2: 98%  Weight: 212 lb (96.2 kg)  Height: 5\' 6"  (1.676 m)      Assessment & Plan:

## 2017-07-26 ENCOUNTER — Other Ambulatory Visit: Payer: Self-pay | Admitting: Internal Medicine

## 2017-07-26 MED ORDER — VITAMIN D (ERGOCALCIFEROL) 1.25 MG (50000 UNIT) PO CAPS: 50000.0000 [IU] | ORAL_CAPSULE | ORAL | 0 refills | Status: DC

## 2017-07-26 MED FILL — VIT D2 1.25 MG (50,000 UNIT: 1.25 MG | 84 days supply | Qty: 12 | Fill #0

## 2017-07-26 NOTE — Assessment & Plan Note (Signed)
Taking lipitor 80 mg daily, checking lipid panel and adjust as needed.  

## 2017-07-26 NOTE — Assessment & Plan Note (Signed)
Complicated by diabetes, hypertension, hyperlipidemia.  

## 2017-07-26 NOTE — Assessment & Plan Note (Signed)
Stable, adding elavil has helped with pain a lot. Encouraged her now that she is not having as much pain to be more active.

## 2017-09-04 ENCOUNTER — Other Ambulatory Visit: Payer: Self-pay | Admitting: Internal Medicine

## 2017-09-04 MED FILL — LISINOPRIL-HCTZ 20-25 MG TA: 20-25 | 90 days supply | Qty: 90 | Fill #1

## 2017-09-04 MED FILL — ATORVASTATIN 80 MG TABLET: 80 | 90 days supply | Qty: 90 | Fill #0

## 2017-09-04 MED FILL — metFORMIN HCL 1000 MG TABS: 1000 | 90 days supply | Qty: 180 | Fill #1

## 2017-09-13 ENCOUNTER — Telehealth: Payer: Self-pay

## 2017-09-13 NOTE — Telephone Encounter (Signed)
PA started on CoverMyMeds KEY: [id number]

## 2017-09-18 NOTE — Telephone Encounter (Signed)
PA approved 09/27/2017-09/26/2018

## 2017-10-04 MED FILL — ACCU-CHEK GUIDE TEST STRIP: 50 days supply | Qty: 100 | Fill #0

## 2017-10-11 MED FILL — JARDIANCE 25 MG TABLET: 25 | 90 days supply | Qty: 90 | Fill #1

## 2017-12-04 ENCOUNTER — Other Ambulatory Visit: Payer: Self-pay | Admitting: Internal Medicine

## 2018-01-02 ENCOUNTER — Other Ambulatory Visit: Payer: Self-pay | Admitting: Internal Medicine

## 2018-01-02 MED FILL — LANTUS SOLOSTAR 100 UNITS/M: 100 | 90 days supply | Qty: 90 | Fill #1

## 2018-01-03 MED FILL — metFORMIN HCL 1000 MG TABS: 1000 | 90 days supply | Qty: 180 | Fill #0

## 2018-01-03 MED FILL — JARDIANCE 25 MG TABLET: 25 | 90 days supply | Qty: 90 | Fill #0

## 2018-01-03 MED FILL — LISINOPRIL-HCTZ 20-25 MG TA: 20-25 | 90 days supply | Qty: 90 | Fill #0

## 2018-01-23 MED FILL — ACCU-CHEK GUIDE TEST STRIP: 50 days supply | Qty: 100 | Fill #0

## 2018-02-03 DIAGNOSIS — H5213 Myopia, bilateral: Secondary | ICD-10-CM | POA: Diagnosis not present

## 2018-02-03 LAB — HM DIABETES EYE EXAM

## 2018-04-01 MED FILL — ACCU-CHEK GUIDE TEST STRIP: 50 days supply | Qty: 100 | Fill #1

## 2018-04-01 MED FILL — ATORVASTATIN 80 MG TABLET: 80 | 90 days supply | Qty: 90 | Fill #1

## 2018-04-01 MED FILL — JARDIANCE 25 MG TABLET: 25 | 90 days supply | Qty: 90 | Fill #1

## 2018-04-01 MED FILL — LISINOPRIL-HCTZ 20-25 MG TA: 20-25 | 90 days supply | Qty: 90 | Fill #1

## 2018-04-01 MED FILL — metFORMIN HCL 1000 MG TABS: 1000 | 90 days supply | Qty: 180 | Fill #1

## 2018-05-29 ENCOUNTER — Encounter: Payer: Self-pay | Admitting: Internal Medicine

## 2018-05-29 ENCOUNTER — Other Ambulatory Visit (INDEPENDENT_AMBULATORY_CARE_PROVIDER_SITE_OTHER): Payer: 59

## 2018-05-29 ENCOUNTER — Ambulatory Visit: Payer: 59 | Admitting: Internal Medicine

## 2018-05-29 VITALS — BP 92/60 | HR 94 | Temp 98.3°F | Ht 66.0 in | Wt 208.0 lb

## 2018-05-29 DIAGNOSIS — E1149 Type 2 diabetes mellitus with other diabetic neurological complication: Secondary | ICD-10-CM

## 2018-05-29 DIAGNOSIS — E1165 Type 2 diabetes mellitus with hyperglycemia: Secondary | ICD-10-CM

## 2018-05-29 DIAGNOSIS — Z1211 Encounter for screening for malignant neoplasm of colon: Secondary | ICD-10-CM | POA: Diagnosis not present

## 2018-05-29 DIAGNOSIS — IMO0002 Reserved for concepts with insufficient information to code with codable children: Secondary | ICD-10-CM

## 2018-05-29 DIAGNOSIS — E1142 Type 2 diabetes mellitus with diabetic polyneuropathy: Secondary | ICD-10-CM | POA: Diagnosis not present

## 2018-05-29 DIAGNOSIS — F331 Major depressive disorder, recurrent, moderate: Secondary | ICD-10-CM

## 2018-05-29 LAB — COMPREHENSIVE METABOLIC PANEL
ALBUMIN: 4.4 g/dL (ref 3.5–5.2)
ALK PHOS: 65 U/L (ref 39–117)
ALT: 14 U/L (ref 0–35)
AST: 10 U/L (ref 0–37)
BILIRUBIN TOTAL: 0.5 mg/dL (ref 0.2–1.2)
BUN: 18 mg/dL (ref 6–23)
CO2: 29 mEq/L (ref 19–32)
Calcium: 9.3 mg/dL (ref 8.4–10.5)
Chloride: 99 mEq/L (ref 96–112)
Creatinine, Ser: 0.6 mg/dL (ref 0.40–1.20)
GFR: 111.18 mL/min (ref >60.00)
GLUCOSE: 115 mg/dL — AB (ref 70–99)
POTASSIUM: 3.6 meq/L (ref 3.5–5.1)
Sodium: 137 mEq/L (ref 135–145)
TOTAL PROTEIN: 7.8 g/dL (ref 6.0–8.3)

## 2018-05-29 LAB — LIPID PANEL
CHOLESTEROL: 226 mg/dL — AB (ref 0–200)
HDL: 48 mg/dL (ref >39.00)
NonHDL: 178.17
Total CHOL/HDL Ratio: 5
Triglycerides: 239 mg/dL — ABNORMAL HIGH (ref 0.0–149.0)
VLDL: 47.8 mg/dL — AB (ref 0.0–40.0)

## 2018-05-29 LAB — CBC
HCT: 43.9 % (ref 36.0–46.0)
HEMOGLOBIN: 14.8 g/dL (ref 12.0–15.0)
MCHC: 33.8 g/dL (ref 30.0–36.0)
MCV: 91.5 fl (ref 78.0–100.0)
PLATELETS: 315 10*3/uL (ref 150.0–400.0)
RBC: 4.8 Mil/uL (ref 3.87–5.11)
RDW: 12.4 % (ref 11.5–15.5)
WBC: 7.2 10*3/uL (ref 4.0–10.5)

## 2018-05-29 LAB — LDL CHOLESTEROL, DIRECT: LDL DIRECT: 145 mg/dL

## 2018-05-29 LAB — MICROALBUMIN / CREATININE URINE RATIO
Creatinine,U: 55.7 mg/dL
MICROALB/CREAT RATIO: 1.3 mg/g (ref 0.0–30.0)
Microalb, Ur: <0.7 mg/dL (ref 0.0–1.9)

## 2018-05-29 LAB — HEMOGLOBIN A1C: HEMOGLOBIN A1C: 9.3 % — AB (ref 4.6–6.5)

## 2018-05-29 MED ORDER — HYDROXYZINE HCL 25 MG PO TABS: 25.0000 mg | ORAL_TABLET | Freq: Two times a day (BID) | ORAL | 0 refills | Status: DC | PRN

## 2018-05-29 MED ORDER — GABAPENTIN 300 MG PO CAPS: 300.0000 mg | ORAL_CAPSULE | Freq: Three times a day (TID) | ORAL | 3 refills | Status: DC

## 2018-05-29 MED ORDER — SERTRALINE HCL 100 MG PO TABS: 100.0000 mg | ORAL_TABLET | Freq: Every day | ORAL | 3 refills | Status: DC

## 2018-05-29 MED FILL — hydrOXYzine HCL 25 MG TABS: 25 | 90 days supply | Qty: 180 | Fill #0

## 2018-05-29 MED FILL — GABAPENTIN 300 MG CAPSULE: 300 | 90 days supply | Qty: 270 | Fill #0

## 2018-05-29 MED FILL — SERTRALINE HCL 100 MG TAB: 100 | 90 days supply | Qty: 90 | Fill #0

## 2018-05-29 NOTE — Patient Instructions (Addendum)
We have sent in zoloft.   We have sent in gabapentin. Take 1 pill daily for 3 days, then increase to 1 pill twice a day for 3 days, then increase to 1 pill 3 times a day if needed for neuropathy.   We are checking labs and cologuard will come to the house.   Work on exercising more.

## 2018-05-29 NOTE — Progress Notes (Signed)
   Subjective:    Patient ID: Donna [name], female    DOB: Feb 26, 1966, 52 y.o.   MRN: [id number]  HPI The patient is a 52 YO female coming in for several concerns including diabetes (poorly controlled, lack of follow up for this, complicated by neuropathy, taking lantus jardiance and metformin, denies low sugars although she is not checking sugars often lately) and neuropathy (worsening gradually, took elavil and this did not help much, she stopped it months ago, used to be on gabapentin and would like to try that again, tried lyrica in the past and this did not help well, due to diabetes) and depression (wants to try zoloft increase dose, more depression symptoms currently, denies SI/HI, not exercising, does not take enough self care time, working more hours and stressful) [place]  Review of Systems  Constitutional: Positive for activity change.  HENT: Negative.   Eyes: Negative.   Respiratory: Negative for cough, chest tightness and shortness of breath.   Cardiovascular: Negative for chest pain, palpitations and leg swelling.  Gastrointestinal: Negative for abdominal distention, abdominal pain, constipation, diarrhea, nausea and vomiting.  Musculoskeletal: Positive for arthralgias and myalgias. Negative for gait problem.  Skin: Negative.   Neurological: Positive for numbness.  Psychiatric/Behavioral: Positive for decreased concentration and dysphoric mood. Negative for self-injury, sleep disturbance and suicidal ideas. The patient is nervous/anxious.       Objective:   Physical Exam  Constitutional: She is oriented to person, place, and time. She appears well-developed and well-nourished.  HENT:  Head: Normocephalic and atraumatic.  Eyes: EOM are normal.  Neck: Normal range of motion.  Cardiovascular: Normal rate and regular rhythm.  Pulmonary/Chest: Effort normal and breath sounds normal. No respiratory distress. She has no wheezes. She has no  rales.  Abdominal: Soft. Bowel sounds are normal. She exhibits no distension. There is no tenderness. There is no rebound.  Musculoskeletal: She exhibits no edema.  Neurological: She is alert and oriented to person, place, and time. A cranial nerve deficit is present. Coordination normal.  Skin: Skin is warm and dry.  Psychiatric: She has a normal mood and affect.   Vitals:   05/29/18 1312  BP: 92/60  Pulse: 94  Temp: 98.3 F (36.8 C)  TempSrc: Oral  SpO2: 97%  Weight: 208 lb (94.3 kg)  Height: 5\' 6"  (1.676 m)      Assessment & Plan:

## 2018-05-30 NOTE — Assessment & Plan Note (Signed)
Checking HgA1c, foot exam done, eye exam done but we need records (patient could not recall MD name). Checking microalbumin to creatinine ratio. Depending on HgA1c we could change to soliqua. Would keep metformin and jardiance. Actos on list but she is not taking. Complicated by neuropathy and hyperlipidemia.

## 2018-05-30 NOTE — Assessment & Plan Note (Signed)
Will go back to zoloft 100 mg daily from elavil. Rx for hydroxyzine as she is having rare panic attacks. Advised some self care time and exercise to help as well. She declines counseling at this time.

## 2018-05-30 NOTE — Assessment & Plan Note (Signed)
Rx for gabapentin and we talked about getting diabetes under control as this will prevent worsening. She is not sure she is ready to make a big commitment to this at this time.

## 2018-06-03 ENCOUNTER — Encounter: Payer: Self-pay | Admitting: Internal Medicine

## 2018-07-21 ENCOUNTER — Other Ambulatory Visit: Payer: Self-pay | Admitting: Internal Medicine

## 2018-07-21 MED FILL — JARDIANCE 25 MG TABLET: 25 | 90 days supply | Qty: 90 | Fill #0

## 2018-07-21 MED FILL — LISINOPRIL-HCTZ 20-25 MG TA: 20-25 | 90 days supply | Qty: 90 | Fill #0

## 2018-07-22 MED FILL — UNIFINE PENTIPS 32GX5/32: 32G X 4 MM | 50 days supply | Qty: 100 | Fill #0

## 2018-07-22 MED FILL — LANTUS SOLOSTAR 100 UNITS/M: 100 | 90 days supply | Qty: 90 | Fill #0

## 2018-09-02 MED FILL — SERTRALINE HCL 100 MG TAB: 100 | 90 days supply | Qty: 90 | Fill #1

## 2018-09-18 ENCOUNTER — Telehealth: Payer: Self-pay

## 2018-09-18 NOTE — Telephone Encounter (Signed)
PA started on CoverMyMeds KEY: [id number]

## 2018-09-22 NOTE — Telephone Encounter (Signed)
Pa approved 09/22/2018-09/22/2019

## 2018-09-24 ENCOUNTER — Encounter: Payer: Self-pay | Admitting: Physician Assistant

## 2018-09-24 ENCOUNTER — Ambulatory Visit (INDEPENDENT_AMBULATORY_CARE_PROVIDER_SITE_OTHER): Payer: Self-pay | Admitting: Physician Assistant

## 2018-09-24 VITALS — BP 145/80 | HR 105 | Temp 101.9°F | Resp 16 | Wt 220.0 lb

## 2018-09-24 DIAGNOSIS — R6889 Other general symptoms and signs: Secondary | ICD-10-CM

## 2018-09-24 DIAGNOSIS — I10 Essential (primary) hypertension: Secondary | ICD-10-CM

## 2018-09-24 DIAGNOSIS — J111 Influenza due to unidentified influenza virus with other respiratory manifestations: Secondary | ICD-10-CM

## 2018-09-24 DIAGNOSIS — R69 Illness, unspecified: Principal | ICD-10-CM

## 2018-09-24 LAB — POCT INFLUENZA A/B
Influenza A, POC: NEGATIVE
Influenza B, POC: NEGATIVE

## 2018-09-24 MED ORDER — BENZONATATE 100 MG PO CAPS: 100.0000 mg | ORAL_CAPSULE | Freq: Three times a day (TID) | ORAL | 0 refills | Status: DC | PRN

## 2018-09-24 MED ORDER — BALOXAVIR MARBOXIL(80 MG DOSE) 2 X 40 MG PO TBPK: 80.0000 mg | ORAL_TABLET | Freq: Once | ORAL | 0 refills | Status: AC

## 2018-09-24 MED ORDER — OSELTAMIVIR PHOSPHATE 75 MG PO CAPS: 75.0000 mg | ORAL_CAPSULE | Freq: Two times a day (BID) | ORAL | 0 refills | Status: DC

## 2018-09-24 MED FILL — OSELTAMIVIR PHOSPHATE 75 MG: 75 | 5 days supply | Qty: 10 | Fill #0

## 2018-09-24 MED FILL — BENZONATATE 100 MG CAP: 100 | 6 days supply | Qty: 40 | Fill #0

## 2018-09-24 NOTE — Patient Instructions (Addendum)
Influenza, Adult  -Rest, increase fluids, and eat light meals. -Take Xofluza or Tamiflu as prescribed. Do not pick up both prescriptions!  -May use over-the-counter ibuprofen or Tylenol for general discomfort and fever. -Use coricidin cough syrup for congestion and cough. -Use tessalon perles for cough. -Use a humidifier or vaporizer when at home and during sleep to help with coughand nasal congestion. -Wear mask when around others and continue with hand hygiene. -Remain out of work until you are symptom-free for 48 hours. -If you do not feel better within the next 1-2 days, please follow up with family doctor. If anything worsens or you develop new productive cough, shortness of breath, or other concerning symptoms, seek care immediately.    Influenza is also called "the flu." It is an infection in the lungs, nose, and throat (respiratory tract). It is caused by a virus. The flu causes symptoms that are similar to symptoms of a cold. It also causes a high fever and body aches. The flu spreads easily from person to person (is contagious). Getting a flu shot (influenza vaccination) every year is the best way to prevent the flu. What are the causes? This condition is caused by the influenza virus. You can get the virus by:  Breathing in droplets that are in the air from the cough or sneeze of a person who has the virus.  Touching something that has the virus on it (is contaminated) and then touching your mouth, nose, or eyes. What increases the risk? Certain things may make you more likely to get the flu. These include:  Not washing your hands often.  Having close contact with many people during cold and flu season.  Touching your mouth, eyes, or nose without first washing your hands.  Not getting a flu shot every year. You may have a higher risk for the flu, along with serious problems such as a lung infection (pneumonia), if you:  Are older than 65.  Are pregnant.  Have a  weakened disease-fighting system (immune system) because of a disease or taking certain medicines.  Have a long-term (chronic) illness, such as: ? Heart, kidney, or lung disease. ? Diabetes. ? Asthma.  Have a liver disorder.  Are very overweight (morbidly obese).  Have anemia. This is a condition that affects your red blood cells. What are the signs or symptoms? Symptoms usually begin suddenly and last 4-14 days. They may include:  Fever and chills.  Headaches, body aches, or muscle aches.  Sore throat.  Cough.  Runny or stuffy (congested) nose.  Chest discomfort.  Not wanting to eat as much as normal (poor appetite).  Weakness or feeling tired (fatigue).  Dizziness.  Feeling sick to your stomach (nauseous) or throwing up (vomiting). How is this treated? If the flu is found early, you can be treated with medicine that can help reduce how bad the illness is and how long it lasts (antiviral medicine). This may be given by mouth (orally) or through an IV tube. Taking care of yourself at home can help your symptoms get better. Your doctor may suggest:  Taking over-the-counter medicines.  Drinking plenty of fluids. The flu often goes away on its own. If you have very bad symptoms or other problems, you may be treated in a hospital. Follow these instructions at home:     Activity  Rest as needed. Get plenty of sleep.  Stay home from work or school as told by your doctor. ? Do not leave home until you do not  have a fever for 24 hours without taking medicine. ? Leave home only to visit your doctor. Eating and drinking  Take an ORS (oral rehydration solution). This is a drink that is sold at pharmacies and stores.  Drink enough fluid to keep your pee (urine) pale yellow.  Drink clear fluids in small amounts as you are able. Clear fluids include: ? Water. ? Ice chips. ? Fruit juice that has water added (diluted fruit juice). ? Low-calorie sports drinks.  Eat  bland, easy-to-digest foods in small amounts as you are able. These foods include: ? Bananas. ? Applesauce. ? Rice. ? Lean meats. ? Toast. ? Crackers.  Do not eat or drink: ? Fluids that have a lot of sugar or caffeine. ? Alcohol. ? Spicy or fatty foods. General instructions  Take over-the-counter and prescription medicines only as told by your doctor.  Use a cool mist humidifier to add moisture to the air in your home. This can make it easier for you to breathe.  Cover your mouth and nose when you cough or sneeze.  Wash your hands with soap and water often, especially after you cough or sneeze. If you cannot use soap and water, use alcohol-based hand sanitizer.  Keep all follow-up visits as told by your doctor. This is important. How is this prevented?   Get a flu shot every year. You may get the flu shot in late summer, fall, or winter. Ask your doctor when you should get your flu shot.  Avoid contact with people who are sick during fall and winter (cold and flu season). Contact a doctor if:  You get new symptoms.  You have: ? Chest pain. ? Watery poop (diarrhea). ? A fever.  Your cough gets worse.  You start to have more mucus.  You feel sick to your stomach.  You throw up. Get help right away if you:  Have shortness of breath.  Have trouble breathing.  Have skin or nails that turn a bluish color.  Have very bad pain or stiffness in your neck.  Get a sudden headache.  Get sudden pain in your face or ear.  Cannot eat or drink without throwing up. Summary  Influenza ("the flu") is an infection in the lungs, nose, and throat. It is caused by a virus.  Take over-the-counter and prescription medicines only as told by your doctor.  Getting a flu shot every year is the best way to avoid getting the flu. This information is not intended to replace advice given to you by your health care provider. Make sure you discuss any questions you have with your  health care provider. Document Released: 05/15/2008 Document Revised: 01/22/2018 Document Reviewed: 01/22/2018 Elsevier Interactive Patient Education  2019 [employer].

## 2018-09-24 NOTE — Progress Notes (Addendum)
MRN: [id number] DOB: 04/18/1966  Subjective:   Donna [name] is a 53 y.o. female presenting for chief complaint of Generalized Body Aches (x 12 hrs); Fever (x 12 hrs ); Chills (x 12 hrs); Headache (x 12 hrs); Sore Throat (x 12 hrs); and Cough (x 1 2 hrs) .  Reports 12 hour history of sudden onset body aches, fever, chills, runny nose, headache, sore throat, and dry cough. 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 Would like testing. Denies sinus pain, inability to swallow, voice change, productive cough, wheezing, shortness of breath and chest pain, nausea, vomiting, abdominal pain and diarrhea. Last took tylenol at 8 am today.  Has PMH of T2DM-last A1C in 05/2018 was 9.3. Denies PMH of heart disease, asthma, or COPD. Had flu shot this season. Denies smoking. Denies recent travel. Denies any other aggravating or relieving factors, no other questions or concerns.  Review of Systems  Constitutional: Negative for diaphoresis.  Eyes: Negative for blurred vision and double vision.  Respiratory: Negative for hemoptysis and stridor.   Cardiovascular: Negative for palpitations and leg swelling.  Genitourinary: Negative for hematuria.  Skin: Negative for rash.  Neurological: Negative for dizziness and weakness.    Donna [name] has a current medication list which includes the following prescription(s): accu-chek fastclix lancets, accu-chek guide, accu-chek aviva plus, gabapentin, jardiance, lantus solostar, lisinopril-hydrochlorothiazide, metformin, sertraline, unifine pentips, atorvastatin, baloxavir marboxil(80 mg dose), benzonatate, hydroxyzine, and oseltamivir. Also has No Known Allergies.  Donna [name]  has a past medical history of Diabetes mellitus, Hyperlipidemia, Hypertension, Lumbar disc disease, and Obesity. Also  has a past surgical history that includes reduction mammoplasty bilaterally; Cholecystectomy ('96); ESI; and Reduction mammaplasty (Bilateral).   Objective:   Vitals: BP (!)  145/80 (BP Location: Right Arm, Patient Position: Sitting, Cuff Size: Normal)   Pulse (!) 105   Temp (!) 101.9 F (38.8 C) (Oral)   Resp 16   Wt 220 lb (99.8 kg)   LMP 09/27/2011   SpO2 97%   BMI 35.51 kg/m   Physical Exam Vitals signs reviewed.  Constitutional:      General: She is not in acute distress.    Appearance: She is well-developed. She is not ill-appearing, toxic-appearing or diaphoretic.  HENT:     Head: Normocephalic and atraumatic.     Right Ear: Tympanic membrane, ear canal and external ear normal.     Left Ear: Tympanic membrane, ear canal and external ear normal.     Nose: Mucosal edema and congestion present.     Right Sinus: No maxillary sinus tenderness or frontal sinus tenderness.     Left Sinus: No maxillary sinus tenderness or frontal sinus tenderness.     Mouth/Throat:     Lips: Pink.     Mouth: Mucous membranes are moist.     Pharynx: Uvula midline. Posterior oropharyngeal erythema present.     Tonsils: No tonsillar exudate or tonsillar abscesses. Swelling: 1+ on the right. 1+ on the left.  Eyes:     Conjunctiva/sclera: Conjunctivae normal.  Neck:     Musculoskeletal: Normal range of motion.  Cardiovascular:     Rate and Rhythm: Regular rhythm. Tachycardia present.     Heart sounds: Normal heart sounds.  Pulmonary:     Effort: Pulmonary effort is normal.     Breath sounds: Normal breath sounds. No decreased breath sounds, wheezing, rhonchi or rales.  Lymphadenopathy:     Head:     Right side of head: No submental, submandibular, tonsillar, preauricular, posterior  auricular or occipital adenopathy.     Left side of head: No submental, submandibular, tonsillar, preauricular, posterior auricular or occipital adenopathy.     Cervical: No cervical adenopathy.     Upper Body:     Right upper body: No supraclavicular adenopathy.     Left upper body: No supraclavicular adenopathy.  Skin:    General: Skin is warm and dry.  Neurological:     Mental  Status: She is alert.     Results for orders placed or performed in visit on 09/24/18 (from the past 24 hour(s))  POCT Influenza A/B     Status: Normal   Collection Time: 09/24/18  1:22 PM  Result Value Ref Range   Influenza A, POC Negative Negative   Influenza B, POC Negative Negative    Assessment and Plan :  1. Influenza-like illness Pt is overall well appearing, NAD. She is febrile at 101.9, has not taken antipyretics in 5.5 hours. She is slightly tachycardic-did improve from 118 bpm to 105bpm after drinking 12 oz of water in office. This is likely due to flu illness and decreased oral intake today. Her lungs are CTAB. With her recent close contact exposure to flu and sudden onset of sx set, influenza is most likely dx despite neg POC test. She denies chest pain, palpitations, SOB, DOE, and productive cough.. With her PMH of uncontrolled T2DM, would recommend antiviral therapy as she is at higher risk for influenza complications. Sent in both tamiflu and xofluza and instructed pharmacy to discard the one she does not fill, as a lot of pharmacy's in town do not have xofluza stocked.  Encouraged rest, hydration, and to continue OTC tylenol or ibuprofen as prescribed for fever. 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 Recommended wearing a mask daily especially around other people. Remain out of work until afebrile for 48 hours w/o use of antipyretics. Educated on potential complications of the flu. Advised to follow up in clinic or with family doctor, local urgent care, or ED if develops any of these concerning symptoms or if current symptoms persist outside of discussed boundaries. - POCT Influenza A/B  2. Hypertension, unspecified type BP slightly elevated at 145/80. No chest pain, SOB, lower leg swelling, headache, dizziness,  blurred vision, or hematuria noted. Rec taking bp meds as prescribed and monitoring bp while at home over the next few days. Contact family doctor if remains elevated. Given  strict ED precautions.   Benjiman CoreBrittany Nnenna Meador, PA-C  [facility] 09/24/2018 1:27 PM

## 2018-09-26 ENCOUNTER — Telehealth: Payer: Self-pay

## 2018-09-26 NOTE — Telephone Encounter (Signed)
Patient states she felt really bad yesterday, but today she feels better.

## 2018-10-01 ENCOUNTER — Other Ambulatory Visit: Payer: Self-pay | Admitting: Internal Medicine

## 2018-10-01 MED FILL — metFORMIN HCL 1000 MG TABS: 1000 | 90 days supply | Qty: 180 | Fill #0

## 2018-10-01 MED FILL — GABAPENTIN 300 MG CAPSULE: 300 | 90 days supply | Qty: 270 | Fill #1

## 2018-10-22 ENCOUNTER — Other Ambulatory Visit: Payer: Self-pay | Admitting: Internal Medicine

## 2018-10-22 MED FILL — LISINOPRIL-HCTZ 20-25 MG TA: 20-25 | 90 days supply | Qty: 90 | Fill #1

## 2018-10-22 MED FILL — LANTUS SOLOSTAR 100 UNITS/M: 100 | 90 days supply | Qty: 90 | Fill #1

## 2018-11-06 ENCOUNTER — Other Ambulatory Visit: Payer: Self-pay | Admitting: Internal Medicine

## 2018-11-06 MED FILL — JARDIANCE 25 MG TABLET: 25 | 90 days supply | Qty: 90 | Fill #1

## 2018-11-06 MED FILL — SERTRALINE HCL 100 MG TAB: 100 | 90 days supply | Qty: 90 | Fill #2

## 2018-11-06 MED FILL — UNIFINE PENTIPS 32GX5/32": 32G X 4 MM | 50 days supply | Qty: 100 | Fill #0

## 2018-11-06 MED FILL — UNIFINE PENTIPS 32GX5/32: 32G X 4 MM | 50 days supply | Qty: 100 | Fill #0

## 2018-11-07 ENCOUNTER — Other Ambulatory Visit: Payer: Self-pay | Admitting: Internal Medicine

## 2019-01-14 ENCOUNTER — Other Ambulatory Visit: Payer: Self-pay | Admitting: Internal Medicine

## 2019-01-14 MED FILL — LANTUS SOLOSTAR 100 UNITS/M: 100 | 90 days supply | Qty: 90 | Fill #0

## 2019-01-14 MED FILL — GABAPENTIN 300 MG CAPSULE: 300 | 90 days supply | Qty: 270 | Fill #2

## 2019-01-14 MED FILL — metFORMIN HCL 1000 MG TABS: 1000 | 90 days supply | Qty: 180 | Fill #0

## 2019-01-14 MED FILL — ACCU-CHEK GUIDE TEST STRIP: 50 days supply | Qty: 100 | Fill #0

## 2019-01-14 MED FILL — LISINOPRIL-HCTZ 20-25 MG TA: 20-25 | 90 days supply | Qty: 90 | Fill #0

## 2019-01-29 ENCOUNTER — Other Ambulatory Visit (INDEPENDENT_AMBULATORY_CARE_PROVIDER_SITE_OTHER): Payer: 59

## 2019-01-29 ENCOUNTER — Ambulatory Visit (INDEPENDENT_AMBULATORY_CARE_PROVIDER_SITE_OTHER): Payer: 59 | Admitting: Internal Medicine

## 2019-01-29 ENCOUNTER — Other Ambulatory Visit: Payer: Self-pay

## 2019-01-29 ENCOUNTER — Encounter: Payer: Self-pay | Admitting: Internal Medicine

## 2019-01-29 VITALS — BP 120/76 | HR 96 | Temp 98.7°F | Ht 66.0 in | Wt 222.0 lb

## 2019-01-29 DIAGNOSIS — L989 Disorder of the skin and subcutaneous tissue, unspecified: Secondary | ICD-10-CM | POA: Diagnosis not present

## 2019-01-29 DIAGNOSIS — E1165 Type 2 diabetes mellitus with hyperglycemia: Secondary | ICD-10-CM | POA: Diagnosis not present

## 2019-01-29 DIAGNOSIS — Z1239 Encounter for other screening for malignant neoplasm of breast: Secondary | ICD-10-CM

## 2019-01-29 DIAGNOSIS — F331 Major depressive disorder, recurrent, moderate: Secondary | ICD-10-CM

## 2019-01-29 DIAGNOSIS — E1149 Type 2 diabetes mellitus with other diabetic neurological complication: Secondary | ICD-10-CM | POA: Diagnosis not present

## 2019-01-29 DIAGNOSIS — IMO0002 Reserved for concepts with insufficient information to code with codable children: Secondary | ICD-10-CM

## 2019-01-29 DIAGNOSIS — Z0000 Encounter for general adult medical examination without abnormal findings: Secondary | ICD-10-CM

## 2019-01-29 DIAGNOSIS — E1169 Type 2 diabetes mellitus with other specified complication: Secondary | ICD-10-CM | POA: Diagnosis not present

## 2019-01-29 DIAGNOSIS — E785 Hyperlipidemia, unspecified: Secondary | ICD-10-CM

## 2019-01-29 DIAGNOSIS — I10 Essential (primary) hypertension: Secondary | ICD-10-CM

## 2019-01-29 LAB — COMPREHENSIVE METABOLIC PANEL
ALT: 17 U/L (ref 0–35)
AST: 12 U/L (ref 0–37)
Albumin: 4.4 g/dL (ref 3.5–5.2)
Alkaline Phosphatase: 65 U/L (ref 39–117)
BUN: 18 mg/dL (ref 6–23)
CO2: 25 mEq/L (ref 19–32)
Calcium: 9.2 mg/dL (ref 8.4–10.5)
Chloride: 99 mEq/L (ref 96–112)
Creatinine, Ser: 0.79 mg/dL (ref 0.40–1.20)
GFR: 75.96 mL/min (ref >60.00)
Glucose, Bld: 134 mg/dL — ABNORMAL HIGH (ref 70–99)
Potassium: 3.7 mEq/L (ref 3.5–5.1)
Sodium: 136 mEq/L (ref 135–145)
Total Bilirubin: 0.3 mg/dL (ref 0.2–1.2)
Total Protein: 7.6 g/dL (ref 6.0–8.3)

## 2019-01-29 LAB — HEMOGLOBIN A1C: Hgb A1c MFr Bld: 7.5 % — ABNORMAL HIGH (ref 4.6–6.5)

## 2019-01-29 MED ORDER — JARDIANCE 25 MG PO TABS: 25.0000 mg | ORAL_TABLET | Freq: Every day | ORAL | 3 refills | Status: DC

## 2019-01-29 MED ORDER — ATORVASTATIN CALCIUM 80 MG PO TABS: 80.0000 mg | ORAL_TABLET | Freq: Every day | ORAL | 1 refills | Status: DC

## 2019-01-29 MED ORDER — GABAPENTIN 300 MG PO CAPS: 300.0000 mg | ORAL_CAPSULE | Freq: Three times a day (TID) | ORAL | 3 refills | Status: DC

## 2019-01-29 MED FILL — JARDIANCE 25 MG TABLET: 25 | 90 days supply | Qty: 90 | Fill #0

## 2019-01-29 MED FILL — ATORVASTATIN 80 MG TABLET: 80 | 90 days supply | Qty: 90 | Fill #0

## 2019-01-29 NOTE — Progress Notes (Signed)
   Subjective:   Patient ID: Donna [name], female    DOB: Nov 05, 1965, 53 y.o.   MRN: [id number]  HPI The patient is a 53 YO female coming in for physical.   PMH, [place], social history reviewed and updated  Review of Systems  Constitutional: Negative.   HENT: Negative.   Eyes: Negative.   Respiratory: Negative for cough, chest tightness and shortness of breath.   Cardiovascular: Negative for chest pain, palpitations and leg swelling.  Gastrointestinal: Negative for abdominal distention, abdominal pain, constipation, diarrhea, nausea and vomiting.  Musculoskeletal: Negative.   Skin: Negative.   Neurological: Positive for numbness.  Psychiatric/Behavioral: Negative.     Objective:  Physical Exam Constitutional:      Appearance: She is well-developed. She is obese.  HENT:     Head: Normocephalic and atraumatic.  Neck:     Musculoskeletal: Normal range of motion.  Cardiovascular:     Rate and Rhythm: Normal rate and regular rhythm.  Pulmonary:     Effort: Pulmonary effort is normal. No respiratory distress.     Breath sounds: Normal breath sounds. No wheezing or rales.  Abdominal:     General: Bowel sounds are normal. There is no distension.     Palpations: Abdomen is soft.     Tenderness: There is no abdominal tenderness. There is no rebound.  Skin:    General: Skin is warm and dry.  Neurological:     Mental Status: She is alert and oriented to person, place, and time.     Cranial Nerves: Cranial nerve deficit present.     Coordination: Coordination normal.     Comments: Neuropathy is at baseline     Vitals:   01/29/19 1557  BP: 120/76  Pulse: 96  Temp: 98.7 F (37.1 C)  TempSrc: Oral  SpO2: 96%  Weight: 222 lb (100.7 kg)  Height: 5\' 6"  (1.676 m)    Assessment & Plan:

## 2019-01-30 DIAGNOSIS — L989 Disorder of the skin and subcutaneous tissue, unspecified: Secondary | ICD-10-CM | POA: Insufficient documentation

## 2019-01-30 NOTE — Assessment & Plan Note (Signed)
Referral to dermatology.  

## 2019-01-30 NOTE — Assessment & Plan Note (Signed)
BP at goal on lisinopril/hctz 20/25. Checking CMP and adjust as needed.  

## 2019-01-30 NOTE — Assessment & Plan Note (Signed)
Stable at this time on zoloft. She is coping okay with the pandemic.

## 2019-01-30 NOTE — Assessment & Plan Note (Signed)
Taking atorvastatin but suspect poor compliance as she has not needed refills in normal interval. Refilled today and encouraged for better compliance.

## 2019-01-30 NOTE — Assessment & Plan Note (Signed)
Previously with worsening control and she did not make any changes. Suspect control will be similarly poor today. Still taking jardiance, lantus, metformin. On ACE-I and statin. Adjust as needed. She is overall not motivated to make an attempt to change at this time.

## 2019-01-30 NOTE — Assessment & Plan Note (Signed)
Flu shot counseled. Pneumonia declines. Shingrix declines. Tetanus up to date. Colonoscopy overdue and counseled. Mammogram overdue and ordered, pap smear overdue and reminded to return to gyn for this. Counseled about sun safety and mole surveillance. Counseled about the dangers of distracted driving. Given 10 year screening recommendations.

## 2019-02-05 ENCOUNTER — Encounter: Payer: Self-pay | Admitting: Internal Medicine

## 2019-02-09 NOTE — Progress Notes (Signed)
Abstracted and sent to scan  

## 2019-02-26 MED FILL — SERTRALINE HCL 100 MG TAB: 100 | 90 days supply | Qty: 90 | Fill #3

## 2019-03-19 DIAGNOSIS — L700 Acne vulgaris: Secondary | ICD-10-CM | POA: Diagnosis not present

## 2019-03-19 DIAGNOSIS — L814 Other melanin hyperpigmentation: Secondary | ICD-10-CM | POA: Diagnosis not present

## 2019-03-19 DIAGNOSIS — L680 Hirsutism: Secondary | ICD-10-CM | POA: Diagnosis not present

## 2019-03-19 DIAGNOSIS — L821 Other seborrheic keratosis: Secondary | ICD-10-CM | POA: Diagnosis not present

## 2019-03-26 ENCOUNTER — Ambulatory Visit
Admission: RE | Admit: 2019-03-26 | Discharge: 2019-03-26 | Disposition: A | Discharge status: Home / Self Care | Payer: 59 | Source: Ambulatory Visit | Attending: Internal Medicine | Admitting: Internal Medicine

## 2019-03-26 ENCOUNTER — Other Ambulatory Visit: Payer: Self-pay

## 2019-03-26 DIAGNOSIS — Z1231 Encounter for screening mammogram for malignant neoplasm of breast: Secondary | ICD-10-CM | POA: Diagnosis not present

## 2019-03-26 DIAGNOSIS — Z1239 Encounter for other screening for malignant neoplasm of breast: Secondary | ICD-10-CM

## 2019-04-17 MED FILL — LISINOPRIL-HCTZ 20-25 MG TA: 20-25 | 90 days supply | Qty: 90 | Fill #1

## 2019-04-17 MED FILL — ACCU-CHEK GUIDE TEST STRIP: 50 days supply | Qty: 100 | Fill #1

## 2019-04-17 MED FILL — LANTUS SOLOSTAR 100 UNITS/M: 100 | 90 days supply | Qty: 90 | Fill #1

## 2019-04-21 ENCOUNTER — Other Ambulatory Visit: Payer: Self-pay | Admitting: Internal Medicine

## 2019-04-21 MED FILL — GABAPENTIN 300 MG CAPSULE: 300 | 90 days supply | Qty: 270 | Fill #3

## 2019-04-22 MED FILL — metFORMIN HCL 1000 MG TABS: 1000 | 90 days supply | Qty: 180 | Fill #0

## 2019-05-01 MED FILL — GABAPENTIN 300 MG CAPSULE: 300 | 90 days supply | Qty: 270 | Fill #0

## 2019-05-01 MED FILL — metFORMIN HCL 1000 MG TABS: 1000 | 90 days supply | Qty: 180 | Fill #0

## 2019-05-12 MED FILL — JARDIANCE 25 MG TABLET: 25 | 90 days supply | Qty: 90 | Fill #1

## 2019-05-26 ENCOUNTER — Encounter: Payer: Self-pay | Admitting: Internal Medicine

## 2019-05-26 ENCOUNTER — Other Ambulatory Visit: Payer: Self-pay

## 2019-05-26 ENCOUNTER — Ambulatory Visit (INDEPENDENT_AMBULATORY_CARE_PROVIDER_SITE_OTHER): Payer: 59 | Admitting: Internal Medicine

## 2019-05-26 VITALS — BP 110/70 | HR 91 | Temp 98.3°F | Ht 66.0 in | Wt 223.0 lb

## 2019-05-26 DIAGNOSIS — M7661 Achilles tendinitis, right leg: Secondary | ICD-10-CM | POA: Insufficient documentation

## 2019-05-26 NOTE — Assessment & Plan Note (Signed)
Refer to podiatry for treatment. Continue ibuprofen 800 mg TID for now and ice. Avoid using if possible.

## 2019-05-26 NOTE — Patient Instructions (Signed)
Take the ibuprofen 800 mg three times a day and we will get you in with Dr. Tamala Julian

## 2019-05-26 NOTE — Progress Notes (Signed)
   Subjective:   Patient ID: Donna [name], female    DOB: Jul 17, 1966, 53 y.o.   MRN: [id number]  HPI The patient is a 53 YO female coming in for concerns about right heel pain. She does feel that she has a bone spur in that area which has also been tender. She denies injury or overuse to cause this. Has been taking ibuprofen which has helped some. She is not having the pain when not moving or walking. Does have swelling over the right achilles area. Overall 6/10 pain with walking. Not improving. Started 2 days ago. No new shoes recently.   Review of Systems  Constitutional: Negative.   HENT: Negative.   Eyes: Negative.   Respiratory: Negative for cough, chest tightness and shortness of breath.   Cardiovascular: Negative for chest pain, palpitations and leg swelling.  Gastrointestinal: Negative for abdominal distention, abdominal pain, constipation, diarrhea, nausea and vomiting.  Musculoskeletal: Positive for myalgias.  Skin: Negative.   Neurological: Negative.   Psychiatric/Behavioral: Negative.     Objective:  Physical Exam Constitutional:      Appearance: She is well-developed.  HENT:     Head: Normocephalic and atraumatic.  Neck:     Musculoskeletal: Normal range of motion.  Cardiovascular:     Rate and Rhythm: Normal rate and regular rhythm.  Pulmonary:     Effort: Pulmonary effort is normal. No respiratory distress.     Breath sounds: Normal breath sounds. No wheezing or rales.  Abdominal:     General: Bowel sounds are normal. There is no distension.     Palpations: Abdomen is soft.     Tenderness: There is no abdominal tenderness. There is no rebound.  Musculoskeletal:        General: Tenderness present.     Comments: Pain and swelling over the achilles tender right and pain with movement of the calf muscle and achilles, no tear felt  Skin:    General: Skin is warm and dry.  Neurological:     Mental Status: She is alert and oriented to person, place, and time.    Coordination: Coordination normal.     Vitals:   05/26/19 0818  BP: 110/70  Pulse: 91  Temp: 98.3 F (36.8 C)  TempSrc: Oral  SpO2: 98%  Weight: 223 lb (101.2 kg)  Height: 5\' 6"  (1.676 m)    Assessment & Plan:

## 2019-06-02 ENCOUNTER — Ambulatory Visit: Payer: 59 | Admitting: Podiatry

## 2019-06-02 ENCOUNTER — Other Ambulatory Visit: Payer: Self-pay

## 2019-06-02 ENCOUNTER — Ambulatory Visit (INDEPENDENT_AMBULATORY_CARE_PROVIDER_SITE_OTHER): Payer: 59

## 2019-06-02 ENCOUNTER — Encounter: Payer: Self-pay | Admitting: Podiatry

## 2019-06-02 VITALS — BP 140/83 | HR 89 | Resp 16

## 2019-06-02 DIAGNOSIS — M7661 Achilles tendinitis, right leg: Secondary | ICD-10-CM

## 2019-06-02 MED ORDER — MELOXICAM 15 MG PO TABS: 15.0000 mg | ORAL_TABLET | Freq: Every day | ORAL | 3 refills | Status: DC

## 2019-06-02 MED FILL — MELOXICAM 15 MG TABLET: 15 | 30 days supply | Qty: 30 | Fill #0

## 2019-06-02 NOTE — Patient Instructions (Signed)

## 2019-06-03 ENCOUNTER — Other Ambulatory Visit: Payer: Self-pay | Admitting: Internal Medicine

## 2019-06-03 MED FILL — SERTRALINE HCL 100 MG TAB: 100 | 90 days supply | Qty: 90 | Fill #0

## 2019-06-03 NOTE — Progress Notes (Signed)
Subjective:  Patient ID: Donna [name], female    DOB: 03-Feb-1966,  MRN: [id number] HPI Chief Complaint  Patient presents with  . Foot Pain    Posterior heel/achilles/calf right - aching, swelling, redness z 1.5 weeks, taking Ibuprofen, tightness in calf, PCP referred  . New Patient (Initial Visit)    53 y.o. female presents with the above complaint.   ROS: Denies fever chills nausea vomiting muscle aches pains calf pain back pain chest pain shortness of breath.  Past Medical History:  Diagnosis Date  . Diabetes mellitus    type 2- uncontrolled  . Hyperlipidemia   . Hypertension   . Lumbar disc disease   . Obesity    Past Surgical History:  Procedure Laterality Date  . CHOLECYSTECTOMY  '96   Lap  . ESI     lumbar spine  . REDUCTION MAMMAPLASTY Bilateral   . reduction mammoplasty bilaterally      Current Outpatient Medications:  .  ACCU-CHEK FASTCLIX LANCETS MISC, Use twice a day to check blood sugars Dx. E11.9, Disp: 102 each, Rfl: 3 .  ACCU-CHEK GUIDE test strip, USE TO TEST CHECK BLOOD SUGAR TWICE DAILY, Disp: 100 each, Rfl: 3 .  atorvastatin (LIPITOR) 80 MG tablet, Take 1 tablet (80 mg total) by mouth daily., Disp: 90 tablet, Rfl: 1 .  Blood Glucose Monitoring Suppl (ACCU-CHEK AVIVA PLUS) w/Device KIT, Use to check blood sugars twice a day Dx E11.9, Disp: 1 kit, Rfl: 0 .  gabapentin (NEURONTIN) 300 MG capsule, Take 1 capsule (300 mg total) by mouth 3 (three) times daily., Disp: 270 capsule, Rfl: 3 .  hydrOXYzine (ATARAX/VISTARIL) 25 MG tablet, Take 1 tablet (25 mg total) by mouth 2 (two) times daily as needed., Disp: 180 tablet, Rfl: 0 .  JARDIANCE 25 MG TABS tablet, Take 25 mg by mouth daily., Disp: 90 tablet, Rfl: 3 .  LANTUS SOLOSTAR 100 UNIT/ML Solostar Pen, INJECT 50 UNITS INTO THE SKIN TWICE DAILY, Disp: 90 mL, Rfl: 1 .  lisinopril-hydrochlorothiazide (ZESTORETIC) 20-25 MG tablet, TAKE 1 TABLET BY MOUTH ONCE DAILY, Disp: 90 tablet, Rfl: 1 .  meloxicam (MOBIC)  15 MG tablet, Take 1 tablet (15 mg total) by mouth daily., Disp: 30 tablet, Rfl: 3 .  metFORMIN (GLUCOPHAGE) 1000 MG tablet, TAKE 1 TABLET BY MOUTH TWO TIMES DAILY, Disp: 180 tablet, Rfl: 2 .  sertraline (ZOLOFT) 100 MG tablet, Take 1 tablet (100 mg total) by mouth daily., Disp: 90 tablet, Rfl: 3 .  UNIFINE PENTIPS 32G X 4 MM MISC, USE AS DIRECTED TWICE DAILY, Disp: 100 each, Rfl: 1  No Known Allergies Review of Systems Objective:   Vitals:   06/02/19 1424  BP: 140/83  Pulse: 89  Resp: 16    General: Well developed, nourished, in no acute distress, alert and oriented x3   Dermatological: Skin is warm, dry and supple bilateral. Nails x 10 are well maintained; remaining integument appears unremarkable at this time. There are no open sores, no preulcerative lesions, no rash or signs of infection present.  Vascular: Dorsalis Pedis artery and Posterior Tibial artery pedal pulses are 2/4 bilateral with immedate capillary fill time. Pedal hair growth present. No varicosities and no lower extremity edema present bilateral.   Neruologic: Grossly intact via light touch bilateral. Vibratory intact via tuning fork bilateral. Protective threshold with Semmes Wienstein monofilament intact to all pedal sites bilateral. Patellar and Achilles deep tendon reflexes 2+ bilateral. No Babinski or clonus noted bilateral.   Musculoskeletal: No gross boney pedal deformities  bilateral. No pain, crepitus, or limitation noted with foot and ankle range of motion bilateral. Muscular strength 5/5 in all groups tested bilateral.  Pain on palpation to the posterior aspect of the Achilles tendon at its insertion site.  Gait: Unassisted, Nonantalgic.    Radiographs:  Radiographs taken today demonstrate retrocalcaneal heel spur and thickening of the Achilles as it inserts on the posterior aspect of the calcaneus.  Assessment & Plan:   Assessment: Insertional Achilles tendinitis right.  Plan: After sterile Betadine  skin prep I injected 2 mg of dexamethasone local anesthetic to the posterior heel into the bursa.  Started her on a Medrol Dosepak followed by meloxicam plantar fascial brace and a cam walker.  Follow-up with her in 1 month she is provided with both oral and written home-going instructions for icing and stretching.     Donna [name] T. [place], [place]

## 2019-06-12 MED FILL — SERTRALINE HCL 100 MG TAB: 100 | 90 days supply | Qty: 90 | Fill #0

## 2019-07-02 ENCOUNTER — Ambulatory Visit (INDEPENDENT_AMBULATORY_CARE_PROVIDER_SITE_OTHER): Payer: 59

## 2019-07-02 ENCOUNTER — Other Ambulatory Visit: Payer: Self-pay

## 2019-07-02 ENCOUNTER — Other Ambulatory Visit: Payer: Self-pay | Admitting: Podiatry

## 2019-07-02 ENCOUNTER — Telehealth: Payer: Self-pay

## 2019-07-02 ENCOUNTER — Encounter: Payer: Self-pay | Admitting: Podiatry

## 2019-07-02 ENCOUNTER — Telehealth: Payer: Self-pay | Admitting: Podiatry

## 2019-07-02 ENCOUNTER — Ambulatory Visit (INDEPENDENT_AMBULATORY_CARE_PROVIDER_SITE_OTHER): Payer: 59 | Admitting: Podiatry

## 2019-07-02 DIAGNOSIS — S86011A Strain of right Achilles tendon, initial encounter: Secondary | ICD-10-CM

## 2019-07-02 DIAGNOSIS — M25571 Pain in right ankle and joints of right foot: Secondary | ICD-10-CM

## 2019-07-02 DIAGNOSIS — R269 Unspecified abnormalities of gait and mobility: Secondary | ICD-10-CM | POA: Diagnosis not present

## 2019-07-02 DIAGNOSIS — M9261 Juvenile osteochondrosis of tarsus, right ankle: Secondary | ICD-10-CM | POA: Diagnosis not present

## 2019-07-02 DIAGNOSIS — M7731 Calcaneal spur, right foot: Secondary | ICD-10-CM

## 2019-07-02 DIAGNOSIS — R600 Localized edema: Secondary | ICD-10-CM

## 2019-07-02 DIAGNOSIS — M65279 Calcific tendinitis, unspecified ankle and foot: Secondary | ICD-10-CM

## 2019-07-02 DIAGNOSIS — M7661 Achilles tendinitis, right leg: Secondary | ICD-10-CM | POA: Diagnosis not present

## 2019-07-02 NOTE — Telephone Encounter (Signed)
Pt called and states she was not in the boot at the time and felt fine, but felt a pop in the tendon and later that evening felt a larger pop, and now she is in the boot again. I told pt I felt she needed to be as soon as possible and offered an appt with Dr. March Rummage at 2:45pm. Pt accepted.

## 2019-07-02 NOTE — Telephone Encounter (Signed)
Left message informing pt that she may have further injured her tendon and she should remain in the cam boot, rest, ice and elevate, contact our office for an earlier appt.

## 2019-07-02 NOTE — Progress Notes (Signed)
Subjective:  Patient ID: Donna [name], female    DOB: May 02, 1966,  MRN: [id number]  Chief Complaint  Patient presents with  . Foot Pain    pt is here for foot pain, located on the back of the ankle, pain happened yesterday, and pt states that the incident happened yesterday    53 y.o. female presents with the above complaint. States she was walking and felt a pop in the back of her calf. Felt an additional pop when walking. Before this was feeling better with use of the boot and only had pain at the proximal calf. Now having pain in the back of the leg and feelings of instability and difficulty walking.  DM, reasonably controlled per patient last A1c around 8.  Review of Systems: Negative except as noted in the HPI. Denies N/V/F/Ch.  Past Medical History:  Diagnosis Date  . Diabetes mellitus    type 2- uncontrolled  . Hyperlipidemia   . Hypertension   . Lumbar disc disease   . Obesity     Current Outpatient Medications:  .  ACCU-CHEK FASTCLIX LANCETS MISC, Use twice a day to check blood sugars Dx. E11.9, Disp: 102 each, Rfl: 3 .  ACCU-CHEK GUIDE test strip, USE TO TEST CHECK BLOOD SUGAR TWICE DAILY, Disp: 100 each, Rfl: 3 .  atorvastatin (LIPITOR) 80 MG tablet, Take 1 tablet (80 mg total) by mouth daily., Disp: 90 tablet, Rfl: 1 .  Blood Glucose Monitoring Suppl (ACCU-CHEK AVIVA PLUS) w/Device KIT, Use to check blood sugars twice a day Dx E11.9, Disp: 1 kit, Rfl: 0 .  gabapentin (NEURONTIN) 300 MG capsule, Take 1 capsule (300 mg total) by mouth 3 (three) times daily., Disp: 270 capsule, Rfl: 3 .  hydrOXYzine (ATARAX/VISTARIL) 25 MG tablet, Take 1 tablet (25 mg total) by mouth 2 (two) times daily as needed., Disp: 180 tablet, Rfl: 0 .  JARDIANCE 25 MG TABS tablet, Take 25 mg by mouth daily., Disp: 90 tablet, Rfl: 3 .  LANTUS SOLOSTAR 100 UNIT/ML Solostar Pen, INJECT 50 UNITS INTO THE SKIN TWICE DAILY, Disp: 90 mL, Rfl: 1 .  lisinopril-hydrochlorothiazide (ZESTORETIC) 20-25 MG  tablet, TAKE 1 TABLET BY MOUTH ONCE DAILY, Disp: 90 tablet, Rfl: 1 .  meloxicam (MOBIC) 15 MG tablet, Take 1 tablet (15 mg total) by mouth daily., Disp: 30 tablet, Rfl: 3 .  metFORMIN (GLUCOPHAGE) 1000 MG tablet, TAKE 1 TABLET BY MOUTH TWO TIMES DAILY, Disp: 180 tablet, Rfl: 2 .  sertraline (ZOLOFT) 100 MG tablet, TAKE 1 TABLET BY MOUTH ONCE DAILY, Disp: 90 tablet, Rfl: 3 .  UNIFINE PENTIPS 32G X 4 MM MISC, USE AS DIRECTED TWICE DAILY, Disp: 100 each, Rfl: 1  Social History   Tobacco Use  Smoking Status Never Smoker  Smokeless Tobacco Never Used    No Known Allergies Objective:  There were no vitals filed for this visit. There is no height or weight on file to calculate BMI. Constitutional Well developed. Well nourished.  Vascular Dorsalis pedis pulses palpable bilaterally. Posterior tibial pulses palpable bilaterally. Capillary refill normal to all digits.  No cyanosis or clubbing noted. Pedal hair growth normal.  Neurologic Normal speech. Oriented to person, place, and time. Epicritic sensation to light touch grossly present bilaterally.  Dermatologic Nails well groomed and normal in appearance. No open wounds. No skin lesions.  Orthopedic: POP R calf, Achilles tendon along the course. Palpable dell along the Achilles 3cm proximal to the insertion. Absent Thompson test.   Radiographs: proximal migration of calcific bodies  since last XRs. Assessment:   1. Achilles tendinitis of right lower extremity   2. Ruptured Achilles tendon due to trauma, right, initial encounter   3. Acute right ankle pain   4. Localized edema   5. Gait disturbance   6. Heel spur, right   7. Haglund's deformity of right heel   8. Calcific tendonitis, ankle and foot    Plan:  Patient was evaluated and treated and all questions answered.  Achilles Rupture Right Ankle -XR reviewed as above, concerning for Rupture. -Order placed for MRI RLE -Order for Knee scooter RLE -Discussed importance of NWB  RLE -Discussed possible need for surgical intervention. Will f/u MRI and plan accordingly -Soft cast / Unna boot applied for reduction of edema.   No follow-ups on file.

## 2019-07-02 NOTE — Telephone Encounter (Signed)
[facility] to fax code to pre-cert MRI right ankle wo contrast.

## 2019-07-02 NOTE — Telephone Encounter (Signed)
Received Passcode:  [id number]. Called [facility] for pre-cert of information of MRI right ankle wo contrast, [facility] - Kathlee Nations states no prior authorization is needed for the MRI, Reference Code: [id number].

## 2019-07-02 NOTE — Telephone Encounter (Signed)
Faxed orders for knee scooter to [facility].

## 2019-07-02 NOTE — Telephone Encounter (Signed)
Faxed orders to [facility] Scheduling.

## 2019-07-02 NOTE — Addendum Note (Signed)
Addended by: Harriett Sine D on: 07/02/2019 04:04 PM   Modules accepted: Orders

## 2019-07-02 NOTE — Telephone Encounter (Signed)
Pt is scheduled for an appt 11/17 for a follow up appt for tendonitis and bone spurs and states she was doing fine until yesterday when she felt a pop in her foot. Pt states she is now having some balance issues with her foot and leg. Please give patient a call.

## 2019-07-02 NOTE — Telephone Encounter (Signed)
Left message on pt's home/mobile phone with the 07/03/2019 [facility].

## 2019-07-02 NOTE — Telephone Encounter (Signed)
[facility] scheduled for 07/03/2019 arrive 4:30pm, imaging 5:00pm.

## 2019-07-03 ENCOUNTER — Ambulatory Visit (HOSPITAL_COMMUNITY)
Admission: RE | Admit: 2019-07-03 | Discharge: 2019-07-03 | Disposition: A | Discharge status: Home / Self Care | Payer: 59 | Source: Ambulatory Visit | Attending: Podiatry | Admitting: Podiatry

## 2019-07-03 DIAGNOSIS — M7661 Achilles tendinitis, right leg: Secondary | ICD-10-CM | POA: Diagnosis not present

## 2019-07-03 DIAGNOSIS — S86011A Strain of right Achilles tendon, initial encounter: Secondary | ICD-10-CM | POA: Insufficient documentation

## 2019-07-03 DIAGNOSIS — M7731 Calcaneal spur, right foot: Secondary | ICD-10-CM | POA: Diagnosis not present

## 2019-07-03 NOTE — Telephone Encounter (Signed)
I spoke with pt and confirmed her appt for the MRI at [facility].

## 2019-07-06 ENCOUNTER — Telehealth: Payer: Self-pay | Admitting: *Deleted

## 2019-07-06 ENCOUNTER — Encounter: Payer: Self-pay | Admitting: Podiatry

## 2019-07-06 NOTE — Telephone Encounter (Signed)
Pt called for results, states she missed his call and I sent a Secure Chat message to Dr. March Rummage.

## 2019-07-07 ENCOUNTER — Ambulatory Visit: Payer: 59 | Admitting: Podiatry

## 2019-07-09 ENCOUNTER — Ambulatory Visit: Payer: 59 | Admitting: Podiatry

## 2019-07-09 ENCOUNTER — Other Ambulatory Visit: Payer: Self-pay

## 2019-07-09 DIAGNOSIS — R269 Unspecified abnormalities of gait and mobility: Secondary | ICD-10-CM

## 2019-07-09 DIAGNOSIS — Z01812 Encounter for preprocedural laboratory examination: Secondary | ICD-10-CM

## 2019-07-09 DIAGNOSIS — M7661 Achilles tendinitis, right leg: Secondary | ICD-10-CM | POA: Diagnosis not present

## 2019-07-09 DIAGNOSIS — M7731 Calcaneal spur, right foot: Secondary | ICD-10-CM | POA: Diagnosis not present

## 2019-07-09 DIAGNOSIS — S86011A Strain of right Achilles tendon, initial encounter: Secondary | ICD-10-CM

## 2019-07-09 DIAGNOSIS — R600 Localized edema: Secondary | ICD-10-CM

## 2019-07-09 DIAGNOSIS — M25571 Pain in right ankle and joints of right foot: Secondary | ICD-10-CM | POA: Diagnosis not present

## 2019-07-09 NOTE — Patient Instructions (Signed)
Pre-Operative Instructions  Congratulations, you have decided to take an important step towards improving your quality of life.  You can be assured that the doctors and staff at [facility] will be with you every step of the way.  Here are some important things you should know:  1. Plan to be at the surgery center/hospital at least 1 (one) hour prior to your scheduled time, unless otherwise directed by the surgical center/hospital staff.  You must have a responsible adult accompany you, remain during the surgery and drive you home.  Make sure you have directions to the surgical center/hospital to ensure you arrive on time. 2. If you are having surgery at [facility] or [facility] hospitals, you will need a copy of your medical history and physical form from your family physician within one month prior to the date of surgery. We will give you a form for your primary physician to complete.  3. We make every effort to accommodate the date you request for surgery.  However, there are times where surgery dates or times have to be moved.  We will contact you as soon as possible if a change in schedule is required.   4. No aspirin/ibuprofen for one week before surgery.  If you are on aspirin, any non-steroidal anti-inflammatory medications (Mobic, Aleve, Ibuprofen) should not be taken seven (7) days prior to your surgery.  You make take Tylenol for pain prior to surgery.  5. Medications - If you are taking daily heart and blood pressure medications, seizure, reflux, allergy, asthma, anxiety, pain or diabetes medications, make sure you notify the surgery center/hospital before the day of surgery so they can tell you which medications you should take or avoid the day of surgery. 6. No food or drink after midnight the night before surgery unless directed otherwise by surgical center/hospital staff. 7. No alcoholic beverages 24-hours prior to surgery.  No smoking 24-hours prior or 24-hours after  surgery. 8. Wear loose pants or shorts. They should be loose enough to fit over bandages, boots, and casts. 9. Don't wear slip-on shoes. Sneakers are preferred. 10. Bring your boot with you to the surgery center/hospital.  Also bring crutches or a walker if your physician has prescribed it for you.  If you do not have this equipment, it will be provided for you after surgery. 11. If you have not been contacted by the surgery center/hospital by the day before your surgery, call to confirm the date and time of your surgery. 12. Leave-time from work may vary depending on the type of surgery you have.  Appropriate arrangements should be made prior to surgery with your employer. 13. Prescriptions will be provided immediately following surgery by your doctor.  Fill these as soon as possible after surgery and take the medication as directed. Pain medications will not be refilled on weekends and must be approved by the doctor. 14. Remove nail polish on the operative foot and avoid getting pedicures prior to surgery. 15. Wash the night before surgery.  The night before surgery wash the foot and leg well with water and the antibacterial soap provided. Be sure to pay special attention to beneath the toenails and in between the toes.  Wash for at least three (3) minutes. Rinse thoroughly with water and dry well with a towel.  Perform this wash unless told not to do so by your physician.  Enclosed: 1 Ice pack (please put in freezer the night before surgery)   1 Hibiclens skin cleaner     Pre-op instructions  If you have any questions regarding the instructions, please do not hesitate to call our office.  [place]: [address], [place], [place] [address] -- [phone]  [place]: [address] [place], [place] [address] -- [phone]  [place]: [address]  [place], [place] [address] -- [phone]   Website: [email or web address] 

## 2019-07-10 ENCOUNTER — Telehealth: Payer: Self-pay | Admitting: *Deleted

## 2019-07-10 NOTE — Telephone Encounter (Signed)
DOS 07/22/2019 GASTROCNEMIUS RECESSION - 72902, REPAIR ACHILLES TENDON W/ POSSIBLE LENGTHENING - 11155, 20802  [facility]: Eligibility Date - 08/20/2018  Benefit percentage  60%Plan pays  40%You pay  Individual deductible  300.00 out of $300  $0.00 to go   Individual integrated out-of-pocket  $6,797.37 to go  $1,102.63 out of $7900.00   Prior Authorization Requirements: NOT REQUIRED

## 2019-07-10 NOTE — Telephone Encounter (Signed)
I am calling to let you know we have you scheduled for surgery at [place] [facility] on 07/22/2019.  "Yes, I am aware."  You will need a Covid test done.  Someone from pre-testing will call you to schedule that appointment.  The Covid test are done at the [facility] on [address].  "So I need to just go there or someone will call me to schedule it?"  Someone will call you from pre-testing to schedule the appointment.  "Okay, I know I have to quarantine for about three days prior to my surgery."  Yes, that is correct.  Did you receive the history and physical forms?  "Yes, I did and I scheduled an appointment with my PCP for Monday."  DOS 07/22/2019 at [facility] @ 2:30 pm [place] assist

## 2019-07-13 ENCOUNTER — Other Ambulatory Visit (HOSPITAL_COMMUNITY): Payer: Self-pay | Admitting: Orthopedic Surgery

## 2019-07-13 ENCOUNTER — Other Ambulatory Visit: Payer: Self-pay

## 2019-07-13 ENCOUNTER — Encounter (HOSPITAL_BASED_OUTPATIENT_CLINIC_OR_DEPARTMENT_OTHER): Payer: Self-pay

## 2019-07-13 ENCOUNTER — Telehealth: Payer: Self-pay | Admitting: *Deleted

## 2019-07-13 ENCOUNTER — Other Ambulatory Visit: Payer: Self-pay | Admitting: Internal Medicine

## 2019-07-13 ENCOUNTER — Ambulatory Visit (INDEPENDENT_AMBULATORY_CARE_PROVIDER_SITE_OTHER): Payer: 59 | Admitting: Internal Medicine

## 2019-07-13 ENCOUNTER — Encounter: Payer: Self-pay | Admitting: Internal Medicine

## 2019-07-13 VITALS — BP 118/82 | HR 91 | Temp 98.3°F | Ht 66.0 in | Wt 227.0 lb

## 2019-07-13 DIAGNOSIS — M7730 Calcaneal spur, unspecified foot: Secondary | ICD-10-CM | POA: Insufficient documentation

## 2019-07-13 DIAGNOSIS — M6701 Short Achilles tendon (acquired), right ankle: Secondary | ICD-10-CM | POA: Diagnosis not present

## 2019-07-13 DIAGNOSIS — S86091A Other specified injury of right Achilles tendon, initial encounter: Secondary | ICD-10-CM | POA: Diagnosis not present

## 2019-07-13 DIAGNOSIS — Z01818 Encounter for other preprocedural examination: Secondary | ICD-10-CM | POA: Diagnosis not present

## 2019-07-13 DIAGNOSIS — M898X7 Other specified disorders of bone, ankle and foot: Secondary | ICD-10-CM | POA: Diagnosis not present

## 2019-07-13 NOTE — Progress Notes (Signed)
   Subjective:   Patient ID: Donna [name], female    DOB: [birth year], 53 y.o.   MRN: [id number]  HPI The patient is a 53 YO female coming in for pre-operative clearance. Having right achilles tendon surgery in about 1-2 weeks. Denies chest pains. Denies SOB. Most recent HgA1c 7.5 is normal. Having pain in the right ankle and struggling with mobility.   PMH, [facility], social history reviewed and updated  Review of Systems  Constitutional: Negative.   HENT: Negative.   Eyes: Negative.   Respiratory: Negative for cough, chest tightness and shortness of breath.   Cardiovascular: Negative for chest pain, palpitations and leg swelling.  Gastrointestinal: Negative for abdominal distention, abdominal pain, constipation, diarrhea, nausea and vomiting.  Musculoskeletal: Positive for arthralgias, gait problem, joint swelling and myalgias.  Skin: Negative.   Psychiatric/Behavioral: Negative.     Objective:  Physical Exam Constitutional:      Appearance: She is well-developed.  HENT:     Head: Normocephalic and atraumatic.  Neck:     Musculoskeletal: Normal range of motion.  Cardiovascular:     Rate and Rhythm: Normal rate and regular rhythm.  Pulmonary:     Effort: Pulmonary effort is normal. No respiratory distress.     Breath sounds: Normal breath sounds. No wheezing or rales.  Abdominal:     General: Bowel sounds are normal. There is no distension.     Palpations: Abdomen is soft.     Tenderness: There is no abdominal tenderness. There is no rebound.  Musculoskeletal:        General: Swelling and tenderness present.     Comments: Right foot and ankle  Skin:    General: Skin is warm and dry.  Neurological:     Mental Status: She is alert and oriented to person, place, and time.     Coordination: Coordination normal.     Vitals:   07/13/19 1545  BP: 118/82  Pulse: 91  Temp: 98.3 F (36.8 C)  TempSrc: Oral  SpO2: 97%  Weight: 227 lb (103 kg)  Height: 5\' 6"  (1.676 m)    This visit occurred during the SARS-CoV-2 public health emergency.  Safety protocols were in place, including screening questions prior to the visit, additional usage of staff PPE, and extensive cleaning of exam room while observing appropriate contact time as indicated for disinfecting solutions.   Assessment & Plan:

## 2019-07-13 NOTE — Telephone Encounter (Signed)
"  I'm scheduled to have surgery next Tuesday with Dr. March Rummage.  I'm calling to cancel it."  Is there a particular reason why you want to cancel your surgery?  "I've decided to go to an Orthopedic.  I'm sorry."  No worries, I will let Dr. March Rummage know and I'll call the surgery center scheduler and cancel the surgery.  I called Quay Burow at [facility] and canceled the surgery that was scheduled for 07/22/2019 at [facility].

## 2019-07-13 NOTE — Patient Instructions (Signed)
Good luck with the surgery!

## 2019-07-14 DIAGNOSIS — Z01818 Encounter for other preprocedural examination: Secondary | ICD-10-CM | POA: Insufficient documentation

## 2019-07-14 NOTE — Assessment & Plan Note (Signed)
Can undergo surgery without further evaluation. Recent EKG normal. Recent HgA1c 7.5 which is at goal. No chest pains. Recent labs fine. Cleared as low risk for upcoming procedure.

## 2019-07-20 ENCOUNTER — Other Ambulatory Visit (HOSPITAL_COMMUNITY)
Admission: RE | Admit: 2019-07-20 | Discharge: 2019-07-20 | Disposition: A | Discharge status: Home / Self Care | Payer: 59 | Source: Ambulatory Visit | Attending: Orthopedic Surgery | Admitting: Orthopedic Surgery

## 2019-07-20 DIAGNOSIS — Z01812 Encounter for preprocedural laboratory examination: Secondary | ICD-10-CM | POA: Diagnosis not present

## 2019-07-20 DIAGNOSIS — Z20828 Contact with and (suspected) exposure to other viral communicable diseases: Secondary | ICD-10-CM | POA: Diagnosis not present

## 2019-07-21 ENCOUNTER — Other Ambulatory Visit: Payer: Self-pay

## 2019-07-21 ENCOUNTER — Encounter (HOSPITAL_BASED_OUTPATIENT_CLINIC_OR_DEPARTMENT_OTHER)
Admission: RE | Admit: 2019-07-21 | Discharge: 2019-07-21 | Disposition: A | Discharge status: Home / Self Care | Payer: 59 | Source: Ambulatory Visit | Attending: Orthopedic Surgery | Admitting: Orthopedic Surgery

## 2019-07-21 DIAGNOSIS — Z01818 Encounter for other preprocedural examination: Secondary | ICD-10-CM | POA: Insufficient documentation

## 2019-07-21 DIAGNOSIS — S86011A Strain of right Achilles tendon, initial encounter: Secondary | ICD-10-CM | POA: Insufficient documentation

## 2019-07-21 LAB — BASIC METABOLIC PANEL
Anion gap: 18 — ABNORMAL HIGH (ref 5–15)
BUN: 13 mg/dL (ref 6–20)
CO2: 19 mmol/L — ABNORMAL LOW (ref 22–32)
Calcium: 9.3 mg/dL (ref 8.9–10.3)
Chloride: 98 mmol/L (ref 98–111)
Creatinine, Ser: 0.73 mg/dL (ref 0.44–1.00)
GFR calc Af Amer: >60 mL/min (ref >60)
GFR calc non Af Amer: >60 mL/min (ref >60)
Glucose, Bld: 123 mg/dL — ABNORMAL HIGH (ref 70–99)
Potassium: 4 mmol/L (ref 3.5–5.1)
Sodium: 135 mmol/L (ref 135–145)

## 2019-07-21 LAB — NOVEL CORONAVIRUS, NAA (HOSP ORDER, SEND-OUT TO REF LAB; TAT 18-24 HRS): SARS-CoV-2, NAA: NOT DETECTED

## 2019-07-21 NOTE — Progress Notes (Signed)

## 2019-07-22 ENCOUNTER — Encounter (HOSPITAL_BASED_OUTPATIENT_CLINIC_OR_DEPARTMENT_OTHER): Payer: Self-pay

## 2019-07-22 ENCOUNTER — Ambulatory Visit (HOSPITAL_BASED_OUTPATIENT_CLINIC_OR_DEPARTMENT_OTHER): Admit: 2019-07-22 | Payer: 59 | Admitting: Podiatry

## 2019-07-22 SURGERY — RECESSION, TENDON, GASTROCNEMIUS
Anesthesia: General | Laterality: Right

## 2019-07-23 ENCOUNTER — Ambulatory Visit (HOSPITAL_BASED_OUTPATIENT_CLINIC_OR_DEPARTMENT_OTHER): Payer: 59 | Admitting: Certified Registered"

## 2019-07-23 ENCOUNTER — Encounter (HOSPITAL_BASED_OUTPATIENT_CLINIC_OR_DEPARTMENT_OTHER): Payer: Self-pay | Admitting: Emergency Medicine

## 2019-07-23 ENCOUNTER — Encounter (HOSPITAL_BASED_OUTPATIENT_CLINIC_OR_DEPARTMENT_OTHER)
Admission: RE | Disposition: A | Discharge status: Home / Self Care | Payer: Self-pay | Source: Home / Self Care | Attending: Orthopedic Surgery

## 2019-07-23 ENCOUNTER — Ambulatory Visit (HOSPITAL_BASED_OUTPATIENT_CLINIC_OR_DEPARTMENT_OTHER)
Admission: RE | Admit: 2019-07-23 | Discharge: 2019-07-23 | Disposition: A | Discharge status: Home / Self Care | Payer: 59 | Attending: Orthopedic Surgery | Admitting: Orthopedic Surgery

## 2019-07-23 ENCOUNTER — Other Ambulatory Visit: Payer: Self-pay

## 2019-07-23 DIAGNOSIS — E785 Hyperlipidemia, unspecified: Secondary | ICD-10-CM | POA: Insufficient documentation

## 2019-07-23 DIAGNOSIS — Z8249 Family history of ischemic heart disease and other diseases of the circulatory system: Secondary | ICD-10-CM | POA: Diagnosis not present

## 2019-07-23 DIAGNOSIS — F329 Major depressive disorder, single episode, unspecified: Secondary | ICD-10-CM | POA: Diagnosis not present

## 2019-07-23 DIAGNOSIS — M66871 Spontaneous rupture of other tendons, right ankle and foot: Secondary | ICD-10-CM | POA: Insufficient documentation

## 2019-07-23 DIAGNOSIS — M216X1 Other acquired deformities of right foot: Secondary | ICD-10-CM | POA: Diagnosis not present

## 2019-07-23 DIAGNOSIS — M6701 Short Achilles tendon (acquired), right ankle: Secondary | ICD-10-CM | POA: Diagnosis not present

## 2019-07-23 DIAGNOSIS — M7731 Calcaneal spur, right foot: Secondary | ICD-10-CM | POA: Diagnosis not present

## 2019-07-23 DIAGNOSIS — E1140 Type 2 diabetes mellitus with diabetic neuropathy, unspecified: Secondary | ICD-10-CM | POA: Insufficient documentation

## 2019-07-23 DIAGNOSIS — M7661 Achilles tendinitis, right leg: Secondary | ICD-10-CM | POA: Insufficient documentation

## 2019-07-23 DIAGNOSIS — K219 Gastro-esophageal reflux disease without esophagitis: Secondary | ICD-10-CM | POA: Insufficient documentation

## 2019-07-23 DIAGNOSIS — Z6835 Body mass index (BMI) 35.0-35.9, adult: Secondary | ICD-10-CM | POA: Diagnosis not present

## 2019-07-23 DIAGNOSIS — I10 Essential (primary) hypertension: Secondary | ICD-10-CM | POA: Insufficient documentation

## 2019-07-23 DIAGNOSIS — Z794 Long term (current) use of insulin: Secondary | ICD-10-CM | POA: Insufficient documentation

## 2019-07-23 DIAGNOSIS — G8918 Other acute postprocedural pain: Secondary | ICD-10-CM | POA: Diagnosis not present

## 2019-07-23 DIAGNOSIS — E119 Type 2 diabetes mellitus without complications: Secondary | ICD-10-CM | POA: Diagnosis not present

## 2019-07-23 DIAGNOSIS — E669 Obesity, unspecified: Secondary | ICD-10-CM | POA: Diagnosis not present

## 2019-07-23 DIAGNOSIS — S86001A Unspecified injury of right Achilles tendon, initial encounter: Secondary | ICD-10-CM | POA: Diagnosis not present

## 2019-07-23 DIAGNOSIS — Z79899 Other long term (current) drug therapy: Secondary | ICD-10-CM | POA: Insufficient documentation

## 2019-07-23 HISTORY — PX: GASTROC RECESSION EXTREMITY: SHX6262

## 2019-07-23 HISTORY — PX: EXCISION HAGLUND'S DEFORMITY WITH ACHILLES TENDON REPAIR: SHX5627

## 2019-07-23 LAB — GLUCOSE, CAPILLARY
Glucose-Capillary: 97 mg/dL (ref 70–99)
Glucose-Capillary: 104 mg/dL — ABNORMAL HIGH (ref 70–99)

## 2019-07-23 SURGERY — RECESSION, TENDON, GASTROCNEMIUS
Anesthesia: Regional | Site: Leg Lower | Laterality: Right

## 2019-07-23 MED ORDER — CHLORHEXIDINE GLUCONATE 4 % EX LIQD: 60.0000 mL | Freq: Once | CUTANEOUS | Status: DC

## 2019-07-23 MED ORDER — VANCOMYCIN HCL 500 MG IV SOLR
INTRAVENOUS | Status: DC | PRN
  Administered 2019-07-23: 500 mg via TOPICAL

## 2019-07-23 MED ORDER — 0.9 % SODIUM CHLORIDE (POUR BTL) OPTIME
TOPICAL | Status: DC | PRN
  Administered 2019-07-23: 200 mL

## 2019-07-23 MED ORDER — PROPOFOL 10 MG/ML IV BOLUS
INTRAVENOUS | Status: DC | PRN
  Administered 2019-07-23: 150 mg via INTRAVENOUS

## 2019-07-23 MED ORDER — FENTANYL CITRATE (PF) 100 MCG/2ML IJ SOLN
INTRAMUSCULAR | Status: AC
  Filled 2019-07-23: qty 2

## 2019-07-23 MED ORDER — ACETAMINOPHEN 500 MG PO TABS
1000.0000 mg | ORAL_TABLET | Freq: Once | ORAL | Status: AC
  Administered 2019-07-23: 1000 mg via ORAL

## 2019-07-23 MED ORDER — PHENYLEPHRINE 40 MCG/ML (10ML) SYRINGE FOR IV PUSH (FOR BLOOD PRESSURE SUPPORT)
PREFILLED_SYRINGE | INTRAVENOUS | Status: AC
  Filled 2019-07-23: qty 20

## 2019-07-23 MED ORDER — MIDAZOLAM HCL 2 MG/2ML IJ SOLN
1.0000 mg | INTRAMUSCULAR | Status: DC | PRN
  Administered 2019-07-23: 2 mg via INTRAVENOUS

## 2019-07-23 MED ORDER — ONDANSETRON HCL 4 MG/2ML IJ SOLN
INTRAMUSCULAR | Status: AC
  Filled 2019-07-23: qty 16

## 2019-07-23 MED ORDER — SODIUM CHLORIDE 0.9 % IV SOLN: INTRAVENOUS | Status: DC

## 2019-07-23 MED ORDER — LIDOCAINE HCL (CARDIAC) PF 100 MG/5ML IV SOSY
PREFILLED_SYRINGE | INTRAVENOUS | Status: DC | PRN
  Administered 2019-07-23: 30 mg via INTRAVENOUS

## 2019-07-23 MED ORDER — OXYCODONE HCL 5 MG PO TABS: 5.0000 mg | ORAL_TABLET | Freq: Four times a day (QID) | ORAL | 0 refills | Status: AC | PRN

## 2019-07-23 MED ORDER — SUCCINYLCHOLINE CHLORIDE 20 MG/ML IJ SOLN
INTRAMUSCULAR | Status: DC | PRN
  Administered 2019-07-23: 100 mg via INTRAVENOUS

## 2019-07-23 MED ORDER — CEFAZOLIN SODIUM-DEXTROSE 2-4 GM/100ML-% IV SOLN
INTRAVENOUS | Status: AC
  Filled 2019-07-23: qty 100

## 2019-07-23 MED ORDER — FENTANYL CITRATE (PF) 100 MCG/2ML IJ SOLN
50.0000 ug | INTRAMUSCULAR | Status: AC | PRN
  Administered 2019-07-23: 25 ug via INTRAVENOUS
  Administered 2019-07-23: 50 ug via INTRAVENOUS
  Administered 2019-07-23: 25 ug via INTRAVENOUS
  Administered 2019-07-23: 50 ug via INTRAVENOUS

## 2019-07-23 MED ORDER — LIDOCAINE 2% (20 MG/ML) 5 ML SYRINGE
INTRAMUSCULAR | Status: AC
  Filled 2019-07-23: qty 25

## 2019-07-23 MED ORDER — LACTATED RINGERS IV SOLN
INTRAVENOUS | Status: DC
  Administered 2019-07-23 (×2): via INTRAVENOUS

## 2019-07-23 MED ORDER — ONDANSETRON HCL 4 MG/2ML IJ SOLN
INTRAMUSCULAR | Status: DC | PRN
  Administered 2019-07-23: 4 mg via INTRAVENOUS

## 2019-07-23 MED ORDER — CEFAZOLIN SODIUM-DEXTROSE 2-4 GM/100ML-% IV SOLN
2.0000 g | INTRAVENOUS | Status: AC
  Administered 2019-07-23: 2 g via INTRAVENOUS

## 2019-07-23 MED ORDER — MIDAZOLAM HCL 2 MG/2ML IJ SOLN
INTRAMUSCULAR | Status: AC
  Filled 2019-07-23: qty 2

## 2019-07-23 MED ORDER — DEXAMETHASONE SODIUM PHOSPHATE 4 MG/ML IJ SOLN
INTRAMUSCULAR | Status: DC | PRN
  Administered 2019-07-23: 5 mg via INTRAVENOUS

## 2019-07-23 MED ORDER — DEXAMETHASONE SODIUM PHOSPHATE 10 MG/ML IJ SOLN
INTRAMUSCULAR | Status: AC
  Filled 2019-07-23: qty 3

## 2019-07-23 MED ORDER — ACETAMINOPHEN 500 MG PO TABS
ORAL_TABLET | ORAL | Status: AC
  Filled 2019-07-23: qty 2

## 2019-07-23 MED FILL — LISINOPRIL-HCTZ 20-25 MG TA: 20-25 | 90 days supply | Qty: 90 | Fill #0

## 2019-07-23 MED FILL — metFORMIN HCL 1000 MG TABS: 1000 | 90 days supply | Qty: 180 | Fill #1

## 2019-07-23 MED FILL — oxyCODONE HCL 5 MG TABS: 5 | 5 days supply | Qty: 20 | Fill #0

## 2019-07-23 SURGICAL SUPPLY — 88 items
ANCH SUT 2 2.9 2 LD TPR NDL (Anchor) ×4 IMPLANT
ANCH SUT 4.75 LNK KNTLS STRL (Anchor) ×4 IMPLANT
ANCHOR JUGGERKNOT WTAP NDL 2.9 (Anchor) ×2 IMPLANT
ANCHOR KNTLS VENTIX 4.75 (Anchor) ×2 IMPLANT
APL PRP STRL LF DISP 70% ISPRP (MISCELLANEOUS) ×2
BANDAGE ESMARK 6X9 LF (GAUZE/BANDAGES/DRESSINGS) ×2 IMPLANT
BIT DRILL JUGRKNT W/NDL BIT2.9 (DRILL) IMPLANT
BLADE AVERAGE 25X9 (BLADE) IMPLANT
BLADE MICRO SAGITTAL (BLADE) ×1 IMPLANT
BLADE SURG 15 STRL LF DISP TIS (BLADE) ×4 IMPLANT
BLADE SURG 15 STRL SS (BLADE) ×9
BNDG CMPR 9X6 STRL LF SNTH (GAUZE/BANDAGES/DRESSINGS) ×2
BNDG COHESIVE 4X5 TAN STRL (GAUZE/BANDAGES/DRESSINGS) ×3 IMPLANT
BNDG COHESIVE 6X5 TAN STRL LF (GAUZE/BANDAGES/DRESSINGS) ×3 IMPLANT
BNDG ESMARK 6X9 LF (GAUZE/BANDAGES/DRESSINGS) ×3
BOOT STEPPER DURA LG (SOFTGOODS) IMPLANT
BOOT STEPPER DURA MED (SOFTGOODS) IMPLANT
CANISTER SUCT 1200ML W/VALVE (MISCELLANEOUS) ×1 IMPLANT
CHLORAPREP W/TINT 26 (MISCELLANEOUS) ×3 IMPLANT
COVER BACK TABLE REUSABLE LG (DRAPES) ×3 IMPLANT
COVER WAND RF STERILE (DRAPES) IMPLANT
CUFF TOURN SGL QUICK 34 (TOURNIQUET CUFF) ×3
CUFF TRNQT CYL 34X4.125X (TOURNIQUET CUFF) ×2 IMPLANT
DRAPE EXTREMITY T 121X128X90 (DISPOSABLE) ×3 IMPLANT
DRAPE HALF SHEET 70X43 (DRAPES) ×3 IMPLANT
DRAPE OEC MINIVIEW 54X84 (DRAPES) IMPLANT
DRAPE U-SHAPE 47X51 STRL (DRAPES) ×3 IMPLANT
DRILL JUGGERKNOT W/NDL BIT 2.9 (DRILL) ×3
DRSG MEPITEL 4X7.2 (GAUZE/BANDAGES/DRESSINGS) ×3 IMPLANT
DRSG PAD ABDOMINAL 8X10 ST (GAUZE/BANDAGES/DRESSINGS) ×6 IMPLANT
ELECT REM PT RETURN 9FT ADLT (ELECTROSURGICAL) ×3
ELECTRODE REM PT RTRN 9FT ADLT (ELECTROSURGICAL) ×2 IMPLANT
GAUZE SPONGE 4X4 12PLY STRL (GAUZE/BANDAGES/DRESSINGS) ×3 IMPLANT
GLOVE BIO SURGEON STRL SZ7 (GLOVE) ×1 IMPLANT
GLOVE BIO SURGEON STRL SZ8 (GLOVE) ×3 IMPLANT
GLOVE BIOGEL PI IND STRL 7.0 (GLOVE) IMPLANT
GLOVE BIOGEL PI IND STRL 7.5 (GLOVE) IMPLANT
GLOVE BIOGEL PI IND STRL 8 (GLOVE) ×4 IMPLANT
GLOVE BIOGEL PI INDICATOR 7.0 (GLOVE) ×2
GLOVE BIOGEL PI INDICATOR 7.5 (GLOVE) ×1
GLOVE BIOGEL PI INDICATOR 8 (GLOVE) ×1
GLOVE ECLIPSE 6.5 STRL STRAW (GLOVE) ×1 IMPLANT
GLOVE ECLIPSE 8.0 STRL XLNG CF (GLOVE) ×2 IMPLANT
GOWN STRL REUS W/ TWL LRG LVL3 (GOWN DISPOSABLE) ×2 IMPLANT
GOWN STRL REUS W/ TWL XL LVL3 (GOWN DISPOSABLE) ×4 IMPLANT
GOWN STRL REUS W/TWL LRG LVL3 (GOWN DISPOSABLE) ×3
GOWN STRL REUS W/TWL XL LVL3 (GOWN DISPOSABLE) ×6
KIT BIO-TENODESIS 3X8 DISP (MISCELLANEOUS)
KIT INSRT BABSR STRL DISP BTN (MISCELLANEOUS) IMPLANT
NDL SAFETY ECLIPSE 18X1.5 (NEEDLE) IMPLANT
NDL SUT 6 .5 CRC .975X.05 MAYO (NEEDLE) IMPLANT
NEEDLE HYPO 18GX1.5 SHARP (NEEDLE)
NEEDLE HYPO 22GX1.5 SAFETY (NEEDLE) IMPLANT
NEEDLE MAYO TAPER (NEEDLE)
NS IRRIG 1000ML POUR BTL (IV SOLUTION) ×3 IMPLANT
PACK BASIN DAY SURGERY FS (CUSTOM PROCEDURE TRAY) ×3 IMPLANT
PAD CAST 4YDX4 CTTN HI CHSV (CAST SUPPLIES) ×2 IMPLANT
PADDING CAST COTTON 4X4 STRL (CAST SUPPLIES) ×3
PADDING CAST COTTON 6X4 STRL (CAST SUPPLIES) ×3 IMPLANT
PENCIL SMOKE EVACUATOR (MISCELLANEOUS) ×3 IMPLANT
SANITIZER HAND PURELL 535ML FO (MISCELLANEOUS) ×3 IMPLANT
SLEEVE SCD COMPRESS KNEE MED (MISCELLANEOUS) ×3 IMPLANT
SPLINT FAST PLASTER 5X30 (CAST SUPPLIES) ×20
SPLINT PLASTER CAST FAST 5X30 (CAST SUPPLIES) ×40 IMPLANT
SPONGE LAP 18X18 RF (DISPOSABLE) ×3 IMPLANT
STAPLER VISISTAT 35W (STAPLE) IMPLANT
STOCKINETTE 6  STRL (DRAPES) ×1
STOCKINETTE 6 STRL (DRAPES) ×2 IMPLANT
SUCTION FRAZIER HANDLE 10FR (MISCELLANEOUS) ×1
SUCTION TUBE FRAZIER 10FR DISP (MISCELLANEOUS) IMPLANT
SUT ETHIBOND 2 OS 4 DA (SUTURE) IMPLANT
SUT ETHIBOND 3-0 V-5 (SUTURE) IMPLANT
SUT ETHILON 3 0 PS 1 (SUTURE) ×4 IMPLANT
SUT FIBERWIRE #2 38 T-5 BLUE (SUTURE)
SUT MNCRL AB 3-0 PS2 18 (SUTURE) ×3 IMPLANT
SUT VIC AB 0 CT1 27 (SUTURE)
SUT VIC AB 0 CT1 27XBRD ANBCTR (SUTURE) IMPLANT
SUT VIC AB 0 SH 27 (SUTURE) ×1 IMPLANT
SUT VIC AB 1 CT1 27 (SUTURE) ×12
SUT VIC AB 1 CT1 27XBRD ANBCTR (SUTURE) IMPLANT
SUT VIC AB 2-0 SH 18 (SUTURE) ×1 IMPLANT
SUT VIC AB 2-0 SH 27 (SUTURE)
SUT VIC AB 2-0 SH 27XBRD (SUTURE) IMPLANT
SUTURE FIBERWR #2 38 T-5 BLUE (SUTURE) IMPLANT
SYR BULB 3OZ (MISCELLANEOUS) ×3 IMPLANT
TOWEL GREEN STERILE FF (TOWEL DISPOSABLE) ×6 IMPLANT
TUBE CONNECTING 20X1/4 (TUBING) ×1 IMPLANT
UNDERPAD 30X36 HEAVY ABSORB (UNDERPADS AND DIAPERS) ×3 IMPLANT

## 2019-07-23 NOTE — Discharge Instructions (Addendum)
Toni Arthurs, MD [facility]  Please read the following information regarding your care after surgery.  Medications  You only need a prescription for the narcotic pain medicine (ex. oxycodone, Percocet, Norco).  All of the other medicines listed below are available over the counter. X Aleve 2 pills twice a day for the first 3 days after surgery. X acetominophen (Tylenol) 650 mg every 4-6 hours as you need for minor to moderate pain X oxycodone as prescribed for severe pain  Narcotic pain medicine (ex. oxycodone, Percocet, Vicodin) will cause constipation.  To prevent this problem, take the following medicines while you are taking any pain medicine. X docusate sodium (Colace) 100 mg twice a day X senna (Senokot) 2 tablets twice a day  X To help prevent blood clots, take a baby aspirin (81 mg) twice a day for two weeks after surgery.  You should also get up every hour while you are awake to move around.    Weight Bearing ? Bear weight when you are able on your operated leg or foot. ? Bear weight only on your operated foot in the post-op shoe. X Do not bear any weight on the operated leg or foot.  Cast / Splint / Dressing X Keep your splint, cast or dressing clean and dry.  Dont put anything (coat hanger, pencil, etc) down inside of it.  If it gets damp, use a hair dryer on the cool setting to dry it.  If it gets soaked, call the office to schedule an appointment for a cast change. ? Remove your dressing 3 days after surgery and cover the incisions with dry dressings.    After your dressing, cast or splint is removed; you may shower, but do not soak or scrub the wound.  Allow the water to run over it, and then gently pat it dry.  Swelling It is normal for you to have swelling where you had surgery.  To reduce swelling and pain, keep your toes above your nose for at least 3 days after surgery.  It may be necessary to keep your foot or leg elevated for several weeks.  If it hurts, it should be  elevated.  Follow Up Call my office at [phone] when you are discharged from the hospital or surgery center to schedule an appointment to be seen two weeks after surgery.  Call my office at [phone] if you develop a fever >101.5 F, nausea, vomiting, bleeding from the surgical site or severe pain.    No tylenol until after 6pm tonight.     Post Anesthesia Home Care Instructions  Activity: Get plenty of rest for the remainder of the day. A responsible individual must stay with you for 24 hours following the procedure.  For the next 24 hours, DO NOT: -Drive a car -Advertising copywriter -Drink alcoholic beverages -Take any medication unless instructed by your physician -Make any legal decisions or sign important papers.  Meals: Start with liquid foods such as gelatin or soup. Progress to regular foods as tolerated. Avoid greasy, spicy, heavy foods. If nausea and/or vomiting occur, drink only clear liquids until the nausea and/or vomiting subsides. Call your physician if vomiting continues.  Special Instructions/Symptoms: Your throat may feel dry or sore from the anesthesia or the breathing tube placed in your throat during surgery. If this causes discomfort, gargle with warm salt water. The discomfort should disappear within 24 hours.  If you had a scopolamine patch placed behind your ear for the management of post- operative nausea and/or  vomiting:  1. The medication in the patch is effective for 72 hours, after which it should be removed.  Wrap patch in a tissue and discard in the trash. Wash hands thoroughly with soap and water. 2. You may remove the patch earlier than 72 hours if you experience unpleasant side effects which may include dry mouth, dizziness or visual disturbances. 3. Avoid touching the patch. Wash your hands with soap and water after contact with the patch.  Regional Anesthesia Blocks  1. Numbness or the inability to move the "blocked" extremity may last from  3-48 hours after placement. The length of time depends on the medication injected and your individual response to the medication. If the numbness is not going away after 48 hours, call your surgeon.  2. The extremity that is blocked will need to be protected until the numbness is gone and the  Strength has returned. Because you cannot feel it, you will need to take extra care to avoid injury. Because it may be weak, you may have difficulty moving it or using it. You may not know what position it is in without looking at it while the block is in effect.  3. For blocks in the legs and feet, returning to weight bearing and walking needs to be done carefully. You will need to wait until the numbness is entirely gone and the strength has returned. You should be able to move your leg and foot normally before you try and bear weight or walk. You will need someone to be with you when you first try to ensure you do not fall and possibly risk injury.  4. Bruising and tenderness at the needle site are common side effects and will resolve in a few days.  5. Persistent numbness or new problems with movement should be communicated to the surgeon or the [facility] [phone] [place] [phone]).

## 2019-07-23 NOTE — Anesthesia Postprocedure Evaluation (Signed)
Anesthesia Post Note  Patient: Donna [name]  Procedure(s) Performed: Gastroc Recession (Right Leg Lower) Right Achilles Tendon Reconstruction, Excision of Haglund Deformity (Right Foot)     Patient location during evaluation: PACU Anesthesia Type: Regional and General Level of consciousness: awake and alert Pain management: pain level controlled Vital Signs Assessment: post-procedure vital signs reviewed and stable Respiratory status: spontaneous breathing, nonlabored ventilation, respiratory function stable and patient connected to nasal cannula oxygen Cardiovascular status: blood pressure returned to baseline and stable Postop Assessment: no apparent nausea or vomiting Anesthetic complications: no    Last Vitals:  Vitals:   07/23/19 1431 07/23/19 1445  BP: (!) 162/94 136/86  Pulse: (!) 101 92  Resp: (!) 21 17  Temp: 37.3 C   SpO2: 97% 99%    Last Pain:  Vitals:   07/23/19 1431  TempSrc:   PainSc: [place]

## 2019-07-23 NOTE — Transfer of Care (Signed)
Immediate Anesthesia Transfer of Care Note  Patient: Donna [name]  Procedure(s) Performed: Gastroc Recession (Right Leg Lower) Right Achilles Tendon Reconstruction, Excision of Haglund Deformity (Right Foot)  Patient Location: PACU  Anesthesia Type:General and Regional  Level of Consciousness: awake, alert  and oriented  Airway & Oxygen Therapy: Patient Spontanous Breathing and Patient connected to face mask oxygen  Post-op Assessment: Report given to RN and Post -op Vital signs reviewed and stable  Post vital signs: Reviewed and stable  Last Vitals:  Vitals Value Taken Time  BP 162/94 07/23/19 1431  Temp    Pulse 96 07/23/19 1432  Resp 20 07/23/19 1432  SpO2 97 % 07/23/19 1432  Vitals shown include unvalidated device data.  Last Pain:  Vitals:   07/23/19 1141  TempSrc: Oral  PainSc: 0-No pain         Complications: No apparent anesthesia complications

## 2019-07-23 NOTE — Anesthesia Preprocedure Evaluation (Addendum)
Anesthesia Evaluation  Patient identified by MRN, date of birth, ID band Patient awake    Reviewed: Allergy & Precautions, NPO status , Patient's Chart, lab work & pertinent test results  Airway Mallampati: II  TM Distance: >3 FB Neck ROM: Full    Dental no notable dental hx.    Pulmonary neg pulmonary ROS,    Pulmonary exam normal breath sounds clear to auscultation       Cardiovascular hypertension, Pt. on medications negative cardio ROS Normal cardiovascular exam Rhythm:Regular Rate:Normal     Neuro/Psych PSYCHIATRIC DISORDERS Depression negative neurological ROS     GI/Hepatic Neg liver ROS, GERD  Medicated and Controlled,  Endo/Other  negative endocrine ROSdiabetes, Well Controlled, Type 2, Insulin Dependent, Oral Hypoglycemic Agents  Renal/GU negative Renal ROS  negative genitourinary   Musculoskeletal Right achilles tendon rupture  Lumbar disc disease   Abdominal   Peds negative pediatric ROS (+)  Hematology negative hematology ROS (+)   Anesthesia Other Findings HLD  Reproductive/Obstetrics negative OB ROS                             Anesthesia Physical Anesthesia Plan  ASA: II  Anesthesia Plan: General and Regional   Post-op Pain Management: GA combined w/ Regional for post-op pain   Induction: Intravenous  PONV Risk Score and Plan: 3 and Ondansetron, Dexamethasone, Midazolam and Treatment may vary due to age or medical condition  Airway Management Planned: Oral ETT  Additional Equipment: None  Intra-op Plan:   Post-operative Plan: Extubation in OR  Informed Consent: I have reviewed the patients History and Physical, chart, labs and discussed the procedure including the risks, benefits and alternatives for the proposed anesthesia with the patient or authorized representative who has indicated his/her understanding and acceptance.     Dental advisory  given  Plan Discussed with: CRNA  Anesthesia Plan Comments:         Anesthesia Quick Evaluation

## 2019-07-23 NOTE — H&P (Signed)
Donna [name] is an 53 y.o. female.   Chief Complaint: Right heel pain HPI: The patient is a 53 year old female with past medical history significant for type 2 diabetes.  She has a long history of right heel pain from Achilles tendinitis.  She has a prominent Haglund deformity and a short heel cord as well.  She has failed nonoperative treatment to date including activity modification, oral anti-inflammatories, shoewear modification and physical therapy.  She presents now for operative treatment of this painful and limiting condition.  Past Medical History:  Diagnosis Date  . Diabetes mellitus    type 2- uncontrolled  . Hyperlipidemia   . Hypertension   . Lumbar disc disease   . Obesity     Past Surgical History:  Procedure Laterality Date  . CHOLECYSTECTOMY  '96   Lap  . ESI     lumbar spine, spinal injections  . REDUCTION MAMMAPLASTY Bilateral   . reduction mammoplasty bilaterally      Family History  Problem Relation Age of Onset  . Other Father        CHF  . Coronary artery disease Father   . Cancer Father        renal cell carcinoma  . Hypertension Father   . Diabetes Father   . Diabetes Mother   . Coronary artery disease Mother   . Heart attack Mother   . Cancer Paternal Aunt        Breast  . Breast cancer Paternal Aunt 24  . Atrial fibrillation Other   . Peripheral vascular disease Other   . Heart disease Other    Social History:  reports that she has never smoked. She has never used smokeless tobacco. She reports that she does not drink alcohol or use drugs.  Allergies: No Known Allergies  Medications Prior to Admission  Medication Sig Dispense Refill  . gabapentin (NEURONTIN) 300 MG capsule Take 1 capsule (300 mg total) by mouth 3 (three) times daily. 270 capsule 3  . JARDIANCE 25 MG TABS tablet Take 25 mg by mouth daily. 90 tablet 3  . LANTUS SOLOSTAR 100 UNIT/ML Solostar Pen INJECT 50 UNITS INTO THE SKIN TWICE DAILY 90 mL 1  .  lisinopril-hydrochlorothiazide (ZESTORETIC) 20-25 MG tablet TAKE 1 TABLET BY MOUTH ONCE DAILY 90 tablet 1  . metFORMIN (GLUCOPHAGE) 1000 MG tablet TAKE 1 TABLET BY MOUTH TWO TIMES DAILY 180 tablet 2  . sertraline (ZOLOFT) 100 MG tablet TAKE 1 TABLET BY MOUTH ONCE DAILY 90 tablet 3  . ACCU-CHEK FASTCLIX LANCETS MISC Use twice a day to check blood sugars Dx. E11.9 102 each 3  . ACCU-CHEK GUIDE test strip USE TO TEST CHECK BLOOD SUGAR TWICE DAILY 100 each 3  . atorvastatin (LIPITOR) 80 MG tablet Take 1 tablet (80 mg total) by mouth daily. 90 tablet 1  . Blood Glucose Monitoring Suppl (ACCU-CHEK AVIVA PLUS) w/Device KIT Use to check blood sugars twice a day Dx E11.9 1 kit 0  . UNIFINE PENTIPS 32G X 4 MM MISC USE AS DIRECTED TWICE DAILY 100 each 1    Results for orders placed or performed during the hospital encounter of 07/23/19 (from the past 48 hour(s))  Glucose, capillary     Status: None   Collection Time: 07/23/19 11:47 AM  Result Value Ref Range   Glucose-Capillary 97 70 - 99 mg/dL   No results found.  ROS no recent fever, chills, nausea, vomiting or changes in her appetite  Blood pressure 137/86, pulse (!) 101,  temperature 97.8 F (36.6 C), temperature source Oral, resp. rate 15, height 5' 6"  (1.676 m), weight 99.5 kg, last menstrual period 09/27/2011, SpO2 98 %. Physical Exam  Well-nourished well-developed woman in no apparent distress.  Alert and oriented x4.  Mood and affect are normal.  Extraocular motions are intact.  Respirations are unlabored.  Gait is antalgic to the right.  The right heel is tender to palpation at the insertion of the Achilles.  Heel cord is tight.  5 out of 5 strength in plantarflexion.  No lymphadenopathy.  Pulses are palpable.  Sensibility to light touch is intact dorsally and plantarly at the forefoot.  Assessment/Plan Right Achilles insertional tendinitis, Haglund deformity and short Achilles tendon -to the operating room today for gastrocnemius recession,  excision of the Haglund deformity and reconstruction of the Achilles tendon.  The risks and benefits of the alternative treatment options have been discussed in detail.  The patient wishes to proceed with surgery and specifically understands risks of bleeding, infection, nerve damage, blood clots, need for additional surgery, amputation and death.   Wylene Simmer, MD 08-16-2019, 12:33 PM

## 2019-07-23 NOTE — Op Note (Signed)
07/23/2019  2:37 PM  PATIENT:  Donna [name]  53 y.o. female  PRE-OPERATIVE DIAGNOSIS: 1.  Acute right partial Achilles tendon rupture on chronic insertional Achilles tendinopathy 2.  Right Haglund deformity 3.  Right Achilles tendon contracture  POST-OPERATIVE DIAGNOSIS: Same  Procedure(s): 1.  Right gastroc Recession through separate incision 2.  Excision of right Haglund Deformity 3.  Reconstruction of right Achilles tendon with plantaris autograft  SURGEON:  Wylene Simmer, MD  ASSISTANT: None  ANESTHESIA:   General, regional  EBL:  minimal   TOURNIQUET: Approximately 90 minutes at 425 mmHg  COMPLICATIONS:  None apparent  DISPOSITION:  Extubated, awake and stable to recovery.  INDICATION FOR PROCEDURE: The patient is a 53 year old female with past medical history significant for uncontrolled diabetes.  She has a long history of right posterior heel pain.  Recently she had a sudden increase in pain and a sense of instability in the ankle.  An MRI reveals a partial Achilles tendon rupture as well as a prominent Haglund deformity and chronic insertional tendinopathy.  She presents now for surgical treatment of this acute on chronic right Achilles tendon injury.  The risks and benefits of the alternative treatment options have been discussed in detail.  The patient wishes to proceed with surgery and specifically understands risks of bleeding, infection, nerve damage, blood clots, need for additional surgery, amputation and death.  PROCEDURE IN DETAIL: After preoperative consent was obtained and the correct operative site was identified, the patient was brought to the operating room supine on a stretcher.  Preoperative antibiotics were administered.  General anesthesia was administered.  A surgical timeout was taken.  The right lower extremity was exsanguinated and a thigh tourniquet inflated to 250 mmHg.  The patient was then turned into the prone position on the operating table with  all bony prominences padded well.  The right lower extremity was prepped and draped in standard sterile fashion.  A longitudinal incision was then made at the distal gastrocnemius.  Dissection was carried down through the subcutaneous tissues taking care to protect the sural nerve and lesser saphenous vein.  The gastrocnemius tendon was identified.  It was divided under direct vision in its entirety.  The plantaris tendon was transected proximally.  The wound was then irrigated.  Vancomycin powder was sprinkled in the wound, and it was closed with Monocryl and nylon.  Attention was turned to the posterior heel where a longitudinal incision was made.  Dissection was carried down through the subcutaneous tissues and peritenon.  The Achilles tendon was noted to be partially ruptured centrally.  The tendon was split longitudinally down to its insertion on the calcaneus and released medially and laterally.  The oscillating saw was used to resect the insertional enthesophytes and prominent Haglund deformity.  The distal portion of the tendon was then repaired back to the cut surface of bone with 4 nonabsorbable [place] medical suture anchors and an hourglass pattern of nonabsorbable suture.  Attention was then returned to the area of partial tendon rupture.  This was several centimeters proximal from the insertion.  The area of degenerated tendon was sharply debrided taking care to preserve the plantaris medially.  The debrided ends of the tendon were then repaired with #1 Vicryl sutures placed in either end of the tendon.  The tendon ends were approximated and the sutures tied securely.  The plantaris tendon was then woven through the proximal stump and then through the distal stump and oversewn with a 3-0 Monocryl Silfverskiold running  circumferential suture.  The ankle could then be dorsiflexed to neutral with no gapping at either tendon repair site.  The wound was irrigated copiously.  The deep fascia was  incised to allow approximation of the peritenon.  Vancomycin powder was placed on the wound.  The peritenon was closed with 2-0 Vicryl.  Skin incision was closed with horizontal mattress sutures of 3-0 nylon.  Sterile dressings were applied followed by a well-padded short leg splint with the ankle in gravity equinus.  The tourniquet was released after application of the dressings.  The patient was awakened from anesthesia and transported to the recovery room in stable condition.   FOLLOW UP PLAN: The patient will be nonweightbearing on the right lower extremity in a short leg splint for 2 weeks.  Follow-up in the office for conversion to a cam boot with 2 heel lifts.  Weightbearing will be determined by how tight her ankle is.  Aspirin for DVT prophylaxis.

## 2019-07-23 NOTE — Anesthesia Procedure Notes (Signed)
Procedure Name: Intubation Date/Time: 07/23/2019 12:57 PM Performed by: Signe Colt, CRNA Pre-anesthesia Checklist: Patient identified, Emergency Drugs available, Suction available and Patient being monitored Patient Re-evaluated:Patient Re-evaluated prior to induction Oxygen Delivery Method: Circle system utilized Preoxygenation: Pre-oxygenation with 100% oxygen Induction Type: IV induction Ventilation: Mask ventilation without difficulty Laryngoscope Size: Mac and 3 Grade View: Grade II Tube type: Oral Tube size: 7.0 mm Number of attempts: 2 Airway Equipment and Method: Stylet and Oral airway Placement Confirmation: ETT inserted through vocal cords under direct vision,  positive ETCO2 and breath sounds checked- equal and bilateral Secured at: 21 cm Tube secured with: Tape Dental Injury: Teeth and Oropharynx as per pre-operative assessment  Comments: Limited oral opening , receeding chin

## 2019-07-23 NOTE — Progress Notes (Signed)
Assisted Dr. Finucane with right, ultrasound guided, popliteal block. Side rails up, monitors on throughout procedure. See vital signs in flow sheet. Tolerated Procedure well. 

## 2019-07-24 ENCOUNTER — Encounter (HOSPITAL_BASED_OUTPATIENT_CLINIC_OR_DEPARTMENT_OTHER): Payer: Self-pay | Admitting: Orthopedic Surgery

## 2019-07-24 MED ORDER — DEXAMETHASONE SODIUM PHOSPHATE 10 MG/ML IJ SOLN
INTRAMUSCULAR | Status: DC | PRN
  Administered 2019-07-23: 10 mg

## 2019-07-24 MED ORDER — ROPIVACAINE HCL 5 MG/ML IJ SOLN
INTRAMUSCULAR | Status: DC | PRN
  Administered 2019-07-23: 20 mL via PERINEURAL

## 2019-07-24 NOTE — Anesthesia Procedure Notes (Signed)
Anesthesia Regional Block: Popliteal block   Pre-Anesthetic Checklist: ,, timeout performed, Correct Patient, Correct Site, Correct Laterality, Correct Procedure, Correct Position, site marked, Risks and benefits discussed,  Surgical consent,  Pre-op evaluation,  At surgeon's request and post-op pain management  Laterality: Right  Prep: Maximum Sterile Barrier Precautions used, chloraprep       Needles:  Injection technique: Single-shot  Needle Type: Echogenic Stimulator Needle     Needle Length: 9cm  Needle Gauge: 22     Additional Needles:   Procedures:,,,, ultrasound used (permanent image in chart),,,,  Narrative:  Start time: 07/23/2019 12:00 PM End time: 07/23/2019 12:10 PM Injection made incrementally with aspirations every 5 mL.  Performed by: Personally  Anesthesiologist: Pervis Hocking, DO  Additional Notes: Monitors applied. No increased pain on injection. No increased resistance to injection. Injection made in 5cc increments. Good needle visualization. Patient tolerated procedure well.

## 2019-07-24 NOTE — Addendum Note (Signed)
Addendum  created 07/24/19 0839 by Pervis Hocking, DO   Child order released for a procedure order, Clinical Note Signed, Intraprocedure Blocks edited, Intraprocedure Meds edited

## 2019-07-31 ENCOUNTER — Encounter: Payer: 59 | Admitting: Podiatry

## 2019-08-02 NOTE — Progress Notes (Signed)
Subjective:  Patient ID: Donna [name], female    DOB: 02-22-1966,  MRN: [id number]  No chief complaint on file.   53 y.o. female presents with the above complaint.  Here for MRI review.  Admits to walking on the right foot despite nonweightbearing precautions.  Here to discuss surgery.   Review of Systems: Negative except as noted in the HPI. Denies N/V/F/Ch.  Past Medical History:  Diagnosis Date  . Diabetes mellitus    type 2- uncontrolled  . Hyperlipidemia   . Hypertension   . Lumbar disc disease   . Obesity     Current Outpatient Medications:  .  ACCU-CHEK FASTCLIX LANCETS MISC, Use twice a day to check blood sugars Dx. E11.9, Disp: 102 each, Rfl: 3 .  ACCU-CHEK GUIDE test strip, USE TO TEST CHECK BLOOD SUGAR TWICE DAILY, Disp: 100 each, Rfl: 3 .  atorvastatin (LIPITOR) 80 MG tablet, Take 1 tablet (80 mg total) by mouth daily., Disp: 90 tablet, Rfl: 1 .  Blood Glucose Monitoring Suppl (ACCU-CHEK AVIVA PLUS) w/Device KIT, Use to check blood sugars twice a day Dx E11.9, Disp: 1 kit, Rfl: 0 .  gabapentin (NEURONTIN) 300 MG capsule, Take 1 capsule (300 mg total) by mouth 3 (three) times daily., Disp: 270 capsule, Rfl: 3 .  JARDIANCE 25 MG TABS tablet, Take 25 mg by mouth daily., Disp: 90 tablet, Rfl: 3 .  LANTUS SOLOSTAR 100 UNIT/ML Solostar Pen, INJECT 50 UNITS INTO THE SKIN TWICE DAILY, Disp: 90 mL, Rfl: 1 .  lisinopril-hydrochlorothiazide (ZESTORETIC) 20-25 MG tablet, TAKE 1 TABLET BY MOUTH ONCE DAILY, Disp: 90 tablet, Rfl: 1 .  metFORMIN (GLUCOPHAGE) 1000 MG tablet, TAKE 1 TABLET BY MOUTH TWO TIMES DAILY, Disp: 180 tablet, Rfl: 2 .  sertraline (ZOLOFT) 100 MG tablet, TAKE 1 TABLET BY MOUTH ONCE DAILY, Disp: 90 tablet, Rfl: 3 .  UNIFINE PENTIPS 32G X 4 MM MISC, USE AS DIRECTED TWICE DAILY, Disp: 100 each, Rfl: 1  Social History   Tobacco Use  Smoking Status Never Smoker  Smokeless Tobacco Never Used    No Known Allergies Objective:  There were no vitals filed for  this visit. There is no height or weight on file to calculate BMI. Constitutional Well developed. Well nourished.  Vascular Dorsalis pedis pulses palpable bilaterally. Posterior tibial pulses palpable bilaterally. Capillary refill normal to all digits.  No cyanosis or clubbing noted. Pedal hair growth normal.  Neurologic Normal speech. Oriented to person, place, and time. Epicritic sensation to light touch grossly present bilaterally.  Dermatologic Nails well groomed and normal in appearance. No open wounds. No skin lesions.  Orthopedic: POP R calf, Achilles tendon along the course. Palpable dell along the Achilles 3cm proximal to the insertion. Absent Thompson test.   Radiographs: MRI reviewed with patient.  20 but not completely ruptured Achilles tendon with about 80% tearing and 20% continuity Assessment:   1. Pre-procedure lab exam   2. Achilles tendinitis of right lower extremity   3. Ruptured Achilles tendon due to trauma, right, initial encounter   4. Acute right ankle pain   5. Localized edema   6. Gait disturbance   7. Heel spur, right    Plan:  Patient was evaluated and treated and all questions answered.  Achilles Rupture Right Ankle -MRI reviewed at length with patient -Discussed conservative versus surgical treatment with patient. -Discussed the importance of compliance with nonweightbearing precautions for optimal surgical outcome -Discussed heightened risk of infection in setting of diabetes.  Last A1c from  June 7.5 none recently though A1c's were consistently elevated before that. -Patient has failed all conservative therapy and wishes to proceed with surgical intervention. All risks, benefits, and alternatives discussed with patient. No guarantees given. Consent reviewed and signed by patient. -Planned procedures: Right Achilles tendon repair with tendon lengthening as indicated, possible flexor hallucis longus transfer  No follow-ups on file.

## 2019-08-03 MED FILL — JARDIANCE 25 MG TABLET: 25 | 90 days supply | Qty: 90 | Fill #2

## 2019-08-03 MED FILL — GABAPENTIN 300 MG CAPSULE: 300 | 90 days supply | Qty: 270 | Fill #1

## 2019-08-06 ENCOUNTER — Encounter: Payer: 59 | Admitting: Podiatry

## 2019-08-07 ENCOUNTER — Other Ambulatory Visit: Payer: Self-pay | Admitting: Internal Medicine

## 2019-08-07 DIAGNOSIS — T8130XA Disruption of wound, unspecified, initial encounter: Secondary | ICD-10-CM | POA: Diagnosis not present

## 2019-08-07 DIAGNOSIS — M6701 Short Achilles tendon (acquired), right ankle: Secondary | ICD-10-CM | POA: Diagnosis not present

## 2019-08-07 DIAGNOSIS — M898X7 Other specified disorders of bone, ankle and foot: Secondary | ICD-10-CM | POA: Diagnosis not present

## 2019-08-07 DIAGNOSIS — Z4889 Encounter for other specified surgical aftercare: Secondary | ICD-10-CM | POA: Diagnosis not present

## 2019-08-07 DIAGNOSIS — S86091A Other specified injury of right Achilles tendon, initial encounter: Secondary | ICD-10-CM | POA: Diagnosis not present

## 2019-08-07 MED FILL — LANTUS SOLOSTAR 100 UNITS/M: 100 | 90 days supply | Qty: 90 | Fill #0

## 2019-08-07 MED FILL — CEPHALEXIN 500 MG CAPSULE: 500 | 10 days supply | Qty: 40 | Fill #0

## 2019-08-10 MED FILL — DOXYCYCLINE HYCLATE 100 MG: 100 | 10 days supply | Qty: 20 | Fill #0

## 2019-08-20 ENCOUNTER — Encounter: Payer: 59 | Admitting: Podiatry

## 2019-08-31 MED FILL — SERTRALINE HCL 100 MG TAB: 100 | 90 days supply | Qty: 90 | Fill #1

## 2019-09-10 MED FILL — DOXYCYCLINE HYCLATE 100 MG: 100 | 10 days supply | Qty: 20 | Fill #0

## 2019-09-10 MED FILL — SERTRALINE HCL 100 MG TAB: 100 | 90 days supply | Qty: 90 | Fill #1

## 2019-09-11 DIAGNOSIS — H5213 Myopia, bilateral: Secondary | ICD-10-CM | POA: Diagnosis not present

## 2019-09-16 MED FILL — SANTYL OINTMENT: 250 | 14 days supply | Qty: 30 | Fill #0

## 2019-10-23 MED FILL — LISINOPRIL-HCTZ 20-25 MG TA: 20-25 | 90 days supply | Qty: 90 | Fill #1

## 2019-10-26 MED FILL — LANTUS SOLOSTAR 100 UNITS/M: 100 | 90 days supply | Qty: 90 | Fill #1

## 2019-10-27 DIAGNOSIS — M25671 Stiffness of right ankle, not elsewhere classified: Secondary | ICD-10-CM | POA: Diagnosis not present

## 2019-11-03 ENCOUNTER — Other Ambulatory Visit: Payer: Self-pay | Admitting: Internal Medicine

## 2019-11-03 MED ORDER — FREESTYLE UNISTICK II LANCETS MISC: 0 refills | Status: DC

## 2019-11-03 MED FILL — FREESTYLE LITE TEST STRIP: 50 days supply | Qty: 100 | Fill #2

## 2019-11-03 MED FILL — FREESTYLE LITE METER: 20 days supply | Qty: 1 | Fill #0

## 2019-11-03 MED FILL — GABAPENTIN 300 MG CAPSULE: 300 | 90 days supply | Qty: 270 | Fill #2

## 2019-11-03 MED FILL — JARDIANCE 25 MG TABLET: 25 | 90 days supply | Qty: 90 | Fill #3

## 2019-11-03 MED FILL — FREESTYLE LANCETS: 50 days supply | Qty: 100 | Fill #0

## 2019-11-03 MED FILL — METFORMIN HCL 1000 MG TABS: 1000 | 90 days supply | Qty: 180 | Fill #2

## 2019-11-16 DIAGNOSIS — M25671 Stiffness of right ankle, not elsewhere classified: Secondary | ICD-10-CM | POA: Diagnosis not present

## 2019-11-25 DIAGNOSIS — M25671 Stiffness of right ankle, not elsewhere classified: Secondary | ICD-10-CM | POA: Diagnosis not present

## 2019-11-26 DIAGNOSIS — M6701 Short Achilles tendon (acquired), right ankle: Secondary | ICD-10-CM | POA: Diagnosis not present

## 2019-11-26 DIAGNOSIS — S86091A Other specified injury of right Achilles tendon, initial encounter: Secondary | ICD-10-CM | POA: Diagnosis not present

## 2019-11-26 DIAGNOSIS — M898X7 Other specified disorders of bone, ankle and foot: Secondary | ICD-10-CM | POA: Diagnosis not present

## 2019-11-30 ENCOUNTER — Other Ambulatory Visit: Payer: Self-pay

## 2019-11-30 ENCOUNTER — Encounter: Payer: Self-pay | Admitting: Internal Medicine

## 2019-11-30 ENCOUNTER — Ambulatory Visit (INDEPENDENT_AMBULATORY_CARE_PROVIDER_SITE_OTHER): Payer: 59 | Admitting: Internal Medicine

## 2019-11-30 ENCOUNTER — Other Ambulatory Visit: Payer: Self-pay | Admitting: Internal Medicine

## 2019-11-30 VITALS — BP 124/72 | HR 80 | Temp 98.5°F | Ht 66.0 in | Wt 227.6 lb

## 2019-11-30 DIAGNOSIS — I10 Essential (primary) hypertension: Secondary | ICD-10-CM

## 2019-11-30 DIAGNOSIS — E785 Hyperlipidemia, unspecified: Secondary | ICD-10-CM

## 2019-11-30 DIAGNOSIS — E1149 Type 2 diabetes mellitus with other diabetic neurological complication: Secondary | ICD-10-CM | POA: Diagnosis not present

## 2019-11-30 DIAGNOSIS — E1169 Type 2 diabetes mellitus with other specified complication: Secondary | ICD-10-CM

## 2019-11-30 DIAGNOSIS — E1165 Type 2 diabetes mellitus with hyperglycemia: Secondary | ICD-10-CM | POA: Diagnosis not present

## 2019-11-30 DIAGNOSIS — IMO0002 Reserved for concepts with insufficient information to code with codable children: Secondary | ICD-10-CM

## 2019-11-30 DIAGNOSIS — E1142 Type 2 diabetes mellitus with diabetic polyneuropathy: Secondary | ICD-10-CM | POA: Diagnosis not present

## 2019-11-30 LAB — COMPREHENSIVE METABOLIC PANEL
ALT: 30 U/L (ref 0–35)
AST: 21 U/L (ref 0–37)
Albumin: 4.5 g/dL (ref 3.5–5.2)
Alkaline Phosphatase: 62 U/L (ref 39–117)
BUN: 15 mg/dL (ref 6–23)
CO2: 27 mEq/L (ref 19–32)
Calcium: 9.2 mg/dL (ref 8.4–10.5)
Chloride: 97 mEq/L (ref 96–112)
Creatinine, Ser: 0.61 mg/dL (ref 0.40–1.20)
GFR: 102.04 mL/min (ref >60.00)
Glucose, Bld: 80 mg/dL (ref 70–99)
Potassium: 3.6 mEq/L (ref 3.5–5.1)
Sodium: 134 mEq/L — ABNORMAL LOW (ref 135–145)
Total Bilirubin: 0.4 mg/dL (ref 0.2–1.2)
Total Protein: 7.6 g/dL (ref 6.0–8.3)

## 2019-11-30 LAB — CBC
HCT: 41.7 % (ref 36.0–46.0)
Hemoglobin: 14.1 g/dL (ref 12.0–15.0)
MCHC: 33.8 g/dL (ref 30.0–36.0)
MCV: 90.5 fl (ref 78.0–100.0)
Platelets: 302 10*3/uL (ref 150.0–400.0)
RBC: 4.61 Mil/uL (ref 3.87–5.11)
RDW: 13.3 % (ref 11.5–15.5)
WBC: 7.8 10*3/uL (ref 4.0–10.5)

## 2019-11-30 LAB — HEMOGLOBIN A1C: Hgb A1c MFr Bld: 7 % — ABNORMAL HIGH (ref 4.6–6.5)

## 2019-11-30 LAB — LDL CHOLESTEROL, DIRECT: Direct LDL: 137 mg/dL

## 2019-11-30 LAB — LIPID PANEL
Cholesterol: 217 mg/dL — ABNORMAL HIGH (ref 0–200)
HDL: 44.8 mg/dL (ref >39.00)
NonHDL: 172.07
Total CHOL/HDL Ratio: 5
Triglycerides: 290 mg/dL — ABNORMAL HIGH (ref 0.0–149.0)
VLDL: 58 mg/dL — ABNORMAL HIGH (ref 0.0–40.0)

## 2019-11-30 MED ORDER — BASAGLAR KWIKPEN 100 UNIT/ML ~~LOC~~ SOPN: 50.0000 [IU] | PEN_INJECTOR | Freq: Two times a day (BID) | SUBCUTANEOUS | 11 refills | Status: DC

## 2019-11-30 NOTE — Patient Instructions (Signed)
Diabetes Mellitus and Standards of Medical Care Managing diabetes (diabetes mellitus) can be complicated. Your diabetes treatment may be managed by a team of health care providers, including:  A physician who specializes in diabetes (endocrinologist).  A nurse practitioner or physician assistant.  Nurses.  A diet and nutrition specialist (registered dietitian).  A certified diabetes educator (CDE).  An exercise specialist.  A pharmacist.  An eye doctor.  A foot specialist (podiatrist).  A dentist.  A primary care provider.  A mental health provider. Your health care providers follow guidelines to help you get the best quality of care. The following schedule is a general guideline for your diabetes management plan. Your health care providers may give you more specific instructions. Physical exams Upon being diagnosed with diabetes mellitus, and each year after that, your health care provider will ask about your medical and family history. He or she will also do a physical exam. Your exam may include:  Measuring your height, weight, and body mass index (BMI).  Checking your blood pressure. This will be done at every routine medical visit. Your target blood pressure may vary depending on your medical conditions, your age, and other factors.  Thyroid gland exam.  Skin exam.  Screening for damage to your nerves (peripheral neuropathy). This may include checking the pulse in your legs and feet and checking the level of sensation in your hands and feet.  A complete foot exam to inspect the structure and skin of your feet, including checking for cuts, bruises, redness, blisters, sores, or other problems.  Screening for blood vessel (vascular) problems, which may include checking the pulse in your legs and feet and checking your temperature. Blood tests Depending on your treatment plan and your personal needs, you may have the following tests done:  HbA1c (hemoglobin A1c). This  test provides information about blood sugar (glucose) control over the previous 2-3 months. It is used to adjust your treatment plan, if needed. This test will be done: ? At least 2 times a year, if you are meeting your treatment goals. ? 4 times a year, if you are not meeting your treatment goals or if treatment goals have changed.  Lipid testing, including total, LDL, and HDL cholesterol and triglyceride levels. ? The goal for LDL is less than 100 mg/dL (5.5 mmol/L). If you are at high risk for complications, the goal is less than 70 mg/dL (3.9 mmol/L). ? The goal for HDL is 40 mg/dL (2.2 mmol/L) or higher for men and 50 mg/dL (2.8 mmol/L) or higher for women. An HDL cholesterol of 60 mg/dL (3.3 mmol/L) or higher gives some protection against heart disease. ? The goal for triglycerides is less than 150 mg/dL (8.3 mmol/L).  Liver function tests.  Kidney function tests.  Thyroid function tests. Dental and eye exams  Visit your dentist two times a year.  If you have type 1 diabetes, your health care provider may recommend an eye exam 3-5 years after you are diagnosed, and then once a year after your first exam. ? For children with type 1 diabetes, a health care provider may recommend an eye exam when your child is age 10 or older and has had diabetes for 3-5 years. After the first exam, your child should get an eye exam once a year.  If you have type 2 diabetes, your health care provider may recommend an eye exam as soon as you are diagnosed, and then once a year after your first exam. Immunizations   The   yearly flu (influenza) vaccine is recommended for everyone 6 months or older who has diabetes.  The pneumonia (pneumococcal) vaccine is recommended for everyone 2 years or older who has diabetes. If you are 65 or older, you may get the pneumonia vaccine as a series of two separate shots.  The hepatitis B vaccine is recommended for adults shortly after being diagnosed with  diabetes.  Adults and children with diabetes should receive all other vaccines according to age-specific recommendations from the Centers for Disease Control and Prevention (CDC). Mental and emotional health Screening for symptoms of eating disorders, anxiety, and depression is recommended at the time of diagnosis and afterward as needed. If your screening shows that you have symptoms (positive screening result), you may need more evaluation and you may work with a mental health care provider. Treatment plan Your treatment plan will be reviewed at every medical visit. You and your health care provider will discuss:  How you are taking your medicines, including insulin.  Any side effects you are experiencing.  Your blood glucose target goals.  The frequency of your blood glucose monitoring.  Lifestyle habits, such as activity level as well as tobacco, alcohol, and substance use. Diabetes self-management education Your health care provider will assess how well you are monitoring your blood glucose levels and whether you are taking your insulin correctly. He or she may refer you to:  A certified diabetes educator to manage your diabetes throughout your life, starting at diagnosis.  A registered dietitian who can create or review your personal nutrition plan.  An exercise specialist who can discuss your activity level and exercise plan. Summary  Managing diabetes (diabetes mellitus) can be complicated. Your diabetes treatment may be managed by a team of health care providers.  Your health care providers follow guidelines in order to help you get the best quality of care.  Standards of care including having regular physical exams, blood tests, blood pressure monitoring, immunizations, screening tests, and education about how to manage your diabetes.  Your health care providers may also give you more specific instructions based on your individual health. This information is not intended  to replace advice given to you by your health care provider. Make sure you discuss any questions you have with your health care provider. Document Revised: 04/25/2018 Document Reviewed: 05/04/2016 Elsevier Patient Education  2020 [employer]  

## 2019-11-30 NOTE — Progress Notes (Signed)
   Subjective:   Patient ID: Donna [name], female    DOB: 1965-12-10, 54 y.o.   MRN: [id number]  HPI The patient is a 54 YO female coming in for follow up diabetes (not well controlled overall, taking jardiance and lantus and metformin, on ACE-I and statin, with neuropathy which is worse gradually, needs change as insurance will not cover lantus this year) and blood pressure (taking lisinopril/hctz, denies side effects, denies headaches or chest pains symptoms) and cholesterol (taking atorvastatin, denies side effects, denies chest pains or stroke symptoms).   Review of Systems  Constitutional: Negative.   HENT: Negative.   Eyes: Negative.   Respiratory: Negative for cough, chest tightness and shortness of breath.   Cardiovascular: Negative for chest pain, palpitations and leg swelling.  Gastrointestinal: Negative for abdominal distention, abdominal pain, constipation, diarrhea, nausea and vomiting.  Musculoskeletal: Negative.   Skin: Negative.   Neurological: Positive for numbness.       Neuropathy pain feet  Psychiatric/Behavioral: Negative.     Objective:  Physical Exam Constitutional:      Appearance: She is well-developed.  HENT:     Head: Normocephalic and atraumatic.  Cardiovascular:     Rate and Rhythm: Normal rate and regular rhythm.  Pulmonary:     Effort: Pulmonary effort is normal. No respiratory distress.     Breath sounds: Normal breath sounds. No wheezing or rales.  Abdominal:     General: Bowel sounds are normal. There is no distension.     Palpations: Abdomen is soft.     Tenderness: There is no abdominal tenderness. There is no rebound.  Musculoskeletal:     Cervical back: Normal range of motion.  Skin:    General: Skin is warm and dry.  Neurological:     Mental Status: She is alert and oriented to person, place, and time.     Coordination: Coordination normal.     Vitals:   11/30/19 1538  BP: 124/72  Pulse: 80  Temp: 98.5 F (36.9 C)  SpO2:  100%  Weight: 227 lb 9.6 oz (103.2 kg)  Height: 5\' 6"  (1.676 m)    This visit occurred during the SARS-CoV-2 public health emergency.  Safety protocols were in place, including screening questions prior to the visit, additional usage of staff PPE, and extensive cleaning of exam room while observing appropriate contact time as indicated for disinfecting solutions.   Assessment & Plan:

## 2019-12-01 NOTE — Assessment & Plan Note (Signed)
BP at goal on lisinopril/hctz 20/25 mg. Checking CMP and adjust as needed.  

## 2019-12-01 NOTE — Assessment & Plan Note (Signed)
Change lantus to basaglar for similar coverage. Continue jardiance and metformin. Checking HgA1c and adjust as needed. On ACE-I and statin. Reminded about eye exam yearly.

## 2019-12-01 NOTE — Assessment & Plan Note (Signed)
Worsening and can increase to up to 600 mg TID for pain and let us know effective dosing and we will update rx.

## 2019-12-01 NOTE — Assessment & Plan Note (Signed)
Checking lipid panel and adjust lipitor 80 mg daily as needed.  

## 2019-12-18 MED FILL — SERTRALINE HCL 100 MG TAB: 100 | 90 days supply | Qty: 90 | Fill #2

## 2020-01-29 ENCOUNTER — Other Ambulatory Visit: Payer: Self-pay | Admitting: Internal Medicine

## 2020-02-08 ENCOUNTER — Ambulatory Visit (INDEPENDENT_AMBULATORY_CARE_PROVIDER_SITE_OTHER): Payer: 59 | Admitting: Internal Medicine

## 2020-02-08 ENCOUNTER — Other Ambulatory Visit: Payer: Self-pay

## 2020-02-08 ENCOUNTER — Encounter: Payer: Self-pay | Admitting: Internal Medicine

## 2020-02-08 VITALS — BP 132/86 | HR 86 | Temp 98.7°F | Ht 66.0 in | Wt 226.0 lb

## 2020-02-08 DIAGNOSIS — E785 Hyperlipidemia, unspecified: Secondary | ICD-10-CM | POA: Diagnosis not present

## 2020-02-08 DIAGNOSIS — I10 Essential (primary) hypertension: Secondary | ICD-10-CM | POA: Diagnosis not present

## 2020-02-08 DIAGNOSIS — E1169 Type 2 diabetes mellitus with other specified complication: Secondary | ICD-10-CM | POA: Diagnosis not present

## 2020-02-08 DIAGNOSIS — Z0000 Encounter for general adult medical examination without abnormal findings: Secondary | ICD-10-CM | POA: Diagnosis not present

## 2020-02-08 DIAGNOSIS — Z1211 Encounter for screening for malignant neoplasm of colon: Secondary | ICD-10-CM

## 2020-02-08 DIAGNOSIS — E1165 Type 2 diabetes mellitus with hyperglycemia: Secondary | ICD-10-CM

## 2020-02-08 DIAGNOSIS — IMO0002 Reserved for concepts with insufficient information to code with codable children: Secondary | ICD-10-CM

## 2020-02-08 DIAGNOSIS — E1149 Type 2 diabetes mellitus with other diabetic neurological complication: Secondary | ICD-10-CM

## 2020-02-08 LAB — VITAMIN D 25 HYDROXY (VIT D DEFICIENCY, FRACTURES): VITD: 11.94 ng/mL — ABNORMAL LOW (ref 30.00–100.00)

## 2020-02-08 LAB — VITAMIN B12: Vitamin B-12: 311 pg/mL (ref 211–911)

## 2020-02-08 LAB — LIPID PANEL
Cholesterol: 105 mg/dL (ref 0–200)
HDL: 38.5 mg/dL — ABNORMAL LOW (ref >39.00)
LDL Cholesterol: 37 mg/dL (ref 0–99)
NonHDL: 66.03
Total CHOL/HDL Ratio: 3
Triglycerides: 144 mg/dL (ref 0.0–149.0)
VLDL: 28.8 mg/dL (ref 0.0–40.0)

## 2020-02-08 LAB — TSH: TSH: 0.93 u[IU]/mL (ref 0.35–4.50)

## 2020-02-08 NOTE — Patient Instructions (Signed)
Health Maintenance, Female Adopting a healthy lifestyle and getting preventive care are important in promoting health and wellness. Ask your health care provider about:  The right schedule for you to have regular tests and exams.  Things you can do on your own to prevent diseases and keep yourself healthy. What should I know about diet, weight, and exercise? Eat a healthy diet   Eat a diet that includes plenty of vegetables, fruits, low-fat dairy products, and lean protein.  Do not eat a lot of foods that are high in solid fats, added sugars, or sodium. Maintain a healthy weight Body mass index (BMI) is used to identify weight problems. It estimates body fat based on height and weight. Your health care provider can help determine your BMI and help you achieve or maintain a healthy weight. Get regular exercise Get regular exercise. This is one of the most important things you can do for your health. Most adults should:  Exercise for at least 150 minutes each week. The exercise should increase your heart rate and make you sweat (moderate-intensity exercise).  Do strengthening exercises at least twice a week. This is in addition to the moderate-intensity exercise.  Spend less time sitting. Even light physical activity can be beneficial. Watch cholesterol and blood lipids Have your blood tested for lipids and cholesterol at 55 years of age, then have this test every 5 years. Have your cholesterol levels checked more often if:  Your lipid or cholesterol levels are high.  You are older than 55 years of age.  You are at high risk for heart disease. What should I know about cancer screening? Depending on your health history and family history, you may need to have cancer screening at various ages. This may include screening for:  Breast cancer.  Cervical cancer.  Colorectal cancer.  Skin cancer.  Lung cancer. What should I know about heart disease, diabetes, and high blood  pressure? Blood pressure and heart disease  High blood pressure causes heart disease and increases the risk of stroke. This is more likely to develop in people who have high blood pressure readings, are of African descent, or are overweight.  Have your blood pressure checked: ? Every 3-5 years if you are 18-39 years of age. ? Every year if you are 40 years old or older. Diabetes Have regular diabetes screenings. This checks your fasting blood sugar level. Have the screening done:  Once every three years after age 40 if you are at a normal weight and have a low risk for diabetes.  More often and at a younger age if you are overweight or have a high risk for diabetes. What should I know about preventing infection? Hepatitis B If you have a higher risk for hepatitis B, you should be screened for this virus. Talk with your health care provider to find out if you are at risk for hepatitis B infection. Hepatitis C Testing is recommended for:  Everyone born from 1945 through 1965.  Anyone with known risk factors for hepatitis C. Sexually transmitted infections (STIs)  Get screened for STIs, including gonorrhea and chlamydia, if: ? You are sexually active and are younger than 55 years of age. ? You are older than 55 years of age and your health care provider tells you that you are at risk for this type of infection. ? Your sexual activity has changed since you were last screened, and you are at increased risk for chlamydia or gonorrhea. Ask your health care provider if   you are at risk.  Ask your health care provider about whether you are at high risk for HIV. Your health care provider may recommend a prescription medicine to help prevent HIV infection. If you choose to take medicine to prevent HIV, you should first get tested for HIV. You should then be tested every 3 months for as long as you are taking the medicine. Pregnancy  If you are about to stop having your period (premenopausal) and  you may become pregnant, seek counseling before you get pregnant.  Take 400 to 800 micrograms (mcg) of folic acid every day if you become pregnant.  Ask for birth control (contraception) if you want to prevent pregnancy. Osteoporosis and menopause Osteoporosis is a disease in which the bones lose minerals and strength with aging. This can result in bone fractures. If you are 65 years old or older, or if you are at risk for osteoporosis and fractures, ask your health care provider if you should:  Be screened for bone loss.  Take a calcium or vitamin D supplement to lower your risk of fractures.  Be given hormone replacement therapy (HRT) to treat symptoms of menopause. Follow these instructions at home: Lifestyle  Do not use any products that contain nicotine or tobacco, such as cigarettes, e-cigarettes, and chewing tobacco. If you need help quitting, ask your health care provider.  Do not use street drugs.  Do not share needles.  Ask your health care provider for help if you need support or information about quitting drugs. Alcohol use  Do not drink alcohol if: ? Your health care provider tells you not to drink. ? You are pregnant, may be pregnant, or are planning to become pregnant.  If you drink alcohol: ? Limit how much you use to 0-1 drink a day. ? Limit intake if you are breastfeeding.  Be aware of how much alcohol is in your drink. In the [place] one drink equals one 12 oz bottle of beer (355 mL), one 5 oz glass of wine (148 mL), or one 1 oz glass of hard liquor (44 mL). General instructions  Schedule regular health, dental, and eye exams.  Stay current with your vaccines.  Tell your health care provider if: ? You often feel depressed. ? You have ever been abused or do not feel safe at home. Summary  Adopting a healthy lifestyle and getting preventive care are important in promoting health and wellness.  Follow your health care provider's instructions about healthy  diet, exercising, and getting tested or screened for diseases.  Follow your health care provider's instructions on monitoring your cholesterol and blood pressure. This information is not intended to replace advice given to you by your health care provider. Make sure you discuss any questions you have with your health care provider. Document Revised: 07/30/2018 Document Reviewed: 07/30/2018 Elsevier Patient Education  2020 Elsevier Inc.  

## 2020-02-08 NOTE — Progress Notes (Signed)
   Subjective:   Patient ID: Donna [name], female    DOB: 1965-10-05, 54 y.o.   MRN: [id number]  HPI The patient is a 54 YO female coming in for physical.   PMH, FMH, social history reviewed and updated  Review of Systems  Constitutional: Negative.   HENT: Negative.   Eyes: Negative.   Respiratory: Negative for cough, chest tightness and shortness of breath.   Cardiovascular: Negative for chest pain, palpitations and leg swelling.  Gastrointestinal: Negative for abdominal distention, abdominal pain, constipation, diarrhea, nausea and vomiting.  Musculoskeletal: Negative.   Skin: Negative.   Neurological: Positive for numbness.  Psychiatric/Behavioral: Negative.     Objective:  Physical Exam Constitutional:      Appearance: She is well-developed.  HENT:     Head: Normocephalic and atraumatic.  Cardiovascular:     Rate and Rhythm: Normal rate and regular rhythm.  Pulmonary:     Effort: Pulmonary effort is normal. No respiratory distress.     Breath sounds: Normal breath sounds. No wheezing or rales.  Abdominal:     General: Bowel sounds are normal. There is no distension.     Palpations: Abdomen is soft.     Tenderness: There is no abdominal tenderness. There is no rebound.  Musculoskeletal:     Cervical back: Normal range of motion.  Skin:    General: Skin is warm and dry.  Neurological:     Mental Status: She is alert and oriented to person, place, and time. Mental status is at baseline.     Coordination: Coordination normal.     Vitals:   02/08/20 1507  BP: 132/86  Pulse: 86  Temp: 98.7 F (37.1 C)  TempSrc: Oral  SpO2: 96%  Weight: 226 lb (102.5 kg)  Height: 5\' 6"  (1.676 m)    This visit occurred during the SARS-CoV-2 public health emergency.  Safety protocols were in place, including screening questions prior to the visit, additional usage of staff PPE, and extensive cleaning of exam room while observing appropriate contact time as indicated for  disinfecting solutions.   Assessment & Plan:

## 2020-02-09 NOTE — Assessment & Plan Note (Signed)
Flu shot yearly. Covid-19 up to date. Pneumonia declines. Shingrix declines. Tetanus up to date. Cologuard ordered. Mammogram up to date, pap smear reminded needs done. Counseled about sun safety and mole surveillance. Counseled about the dangers of distracted driving. Given 10 year screening recommendations.

## 2020-02-09 NOTE — Assessment & Plan Note (Signed)
Last HgA1c at goal. She is taking her basaglar, jardiance, metformin. Recently resumed lipitor so rechecking lipid panel. Reminded about eye exam and pneumonia shot.

## 2020-02-09 NOTE — Assessment & Plan Note (Signed)
Checking lipid panel as she is now back on lipitor 80 mg daily for 2 months.

## 2020-02-11 ENCOUNTER — Other Ambulatory Visit: Payer: Self-pay | Admitting: Internal Medicine

## 2020-02-11 MED ORDER — VITAMIN D (ERGOCALCIFEROL) 1.25 MG (50000 UNIT) PO CAPS
50000.0000 [IU] | ORAL_CAPSULE | ORAL | 0 refills | Status: DC
Start: 2020-02-11 — End: 2020-05-17

## 2020-02-11 MED FILL — VIT D2 1.25 MG (50,000 UNIT: 1.25 MG | 84 days supply | Qty: 12 | Fill #0

## 2020-02-26 MED FILL — BASAGLAR 100 UNIT/ML KWIKPE: 100 | 30 days supply | Qty: 30 | Fill #0

## 2020-03-11 ENCOUNTER — Other Ambulatory Visit: Payer: Self-pay | Admitting: Internal Medicine

## 2020-03-14 ENCOUNTER — Other Ambulatory Visit: Payer: Self-pay | Admitting: Internal Medicine

## 2020-03-14 MED FILL — ATORVASTATIN 80 MG TABLET: 80 | 90 days supply | Qty: 90 | Fill #0

## 2020-03-18 MED FILL — SERTRALINE HCL 100 MG TABS: 100 | 90 days supply | Qty: 90 | Fill #3

## 2020-04-07 ENCOUNTER — Other Ambulatory Visit: Payer: Self-pay | Admitting: Internal Medicine

## 2020-04-07 ENCOUNTER — Encounter: Payer: Self-pay | Admitting: Internal Medicine

## 2020-04-07 MED ORDER — GABAPENTIN 300 MG PO CAPS: 300.0000 mg | ORAL_CAPSULE | Freq: Four times a day (QID) | ORAL | 1 refills | Status: DC

## 2020-04-07 MED FILL — BASAGLAR 100 UNIT/ML KWIKPE: 100 | 30 days supply | Qty: 30 | Fill #1

## 2020-04-07 MED FILL — GABAPENTIN 300 MG CAPSULE: 300 | 90 days supply | Qty: 360 | Fill #0

## 2020-04-07 NOTE — Addendum Note (Signed)
Addended by: Corwin Levins on: 04/07/2020 05:07 PM   Modules accepted: Orders

## 2020-04-29 ENCOUNTER — Other Ambulatory Visit: Payer: Self-pay | Admitting: Internal Medicine

## 2020-04-29 MED FILL — LISINOPRIL-HCTZ 20-25 MG TA: 20-25 | 90 days supply | Qty: 90 | Fill #1

## 2020-05-02 ENCOUNTER — Other Ambulatory Visit: Payer: Self-pay | Admitting: Internal Medicine

## 2020-05-02 MED FILL — JARDIANCE 25 MG TABLET: 25 | 90 days supply | Qty: 90 | Fill #1

## 2020-05-02 MED FILL — FREESTYLE LITE TEST STRIP: 50 days supply | Qty: 100 | Fill #0

## 2020-05-02 MED FILL — BASAGLAR 100 UNIT/ML KWIKPE: 100 | 30 days supply | Qty: 30 | Fill #2

## 2020-05-02 MED FILL — METFORMIN HCL 1000 MG TABS: 1000 | 90 days supply | Qty: 180 | Fill #1

## 2020-05-17 ENCOUNTER — Ambulatory Visit
Admission: EM | Admit: 2020-05-17 | Discharge: 2020-05-17 | Disposition: A | Discharge status: Home / Self Care | Payer: 59 | Attending: Physician Assistant | Admitting: Physician Assistant

## 2020-05-17 ENCOUNTER — Telehealth (INDEPENDENT_AMBULATORY_CARE_PROVIDER_SITE_OTHER): Payer: 59 | Admitting: Family Medicine

## 2020-05-17 ENCOUNTER — Encounter: Payer: Self-pay | Admitting: Family Medicine

## 2020-05-17 ENCOUNTER — Other Ambulatory Visit: Payer: Self-pay

## 2020-05-17 VITALS — Temp 101.0°F

## 2020-05-17 DIAGNOSIS — R52 Pain, unspecified: Secondary | ICD-10-CM

## 2020-05-17 DIAGNOSIS — E1149 Type 2 diabetes mellitus with other diabetic neurological complication: Secondary | ICD-10-CM

## 2020-05-17 DIAGNOSIS — W57XXXA Bitten or stung by nonvenomous insect and other nonvenomous arthropods, initial encounter: Secondary | ICD-10-CM | POA: Diagnosis not present

## 2020-05-17 DIAGNOSIS — R509 Fever, unspecified: Secondary | ICD-10-CM

## 2020-05-17 DIAGNOSIS — R21 Rash and other nonspecific skin eruption: Secondary | ICD-10-CM

## 2020-05-17 DIAGNOSIS — L989 Disorder of the skin and subcutaneous tissue, unspecified: Secondary | ICD-10-CM

## 2020-05-17 DIAGNOSIS — E1165 Type 2 diabetes mellitus with hyperglycemia: Secondary | ICD-10-CM | POA: Diagnosis not present

## 2020-05-17 DIAGNOSIS — IMO0002 Reserved for concepts with insufficient information to code with codable children: Secondary | ICD-10-CM

## 2020-05-17 MED ORDER — ACETAMINOPHEN 325 MG PO TABS
975.0000 mg | ORAL_TABLET | Freq: Once | ORAL | Status: AC
  Administered 2020-05-17: 975 mg via ORAL

## 2020-05-17 MED ORDER — DOXYCYCLINE HYCLATE 100 MG PO CAPS: 100.0000 mg | ORAL_CAPSULE | Freq: Two times a day (BID) | ORAL | 0 refills | Status: DC

## 2020-05-17 MED FILL — DOXYCYCLINE HYCLATE 100 MG: 100 | 7 days supply | Qty: 14 | Fill #0

## 2020-05-17 NOTE — Progress Notes (Signed)
Virtual Visit via Video Note  I connected with Donna [name]  on 05/17/20 at 12:40 PM EDT by a video enabled telemedicine application and verified that I am speaking with the correct person using two identifiers.  Location patient: home, [place] Location provider:work or home office Persons participating in the virtual visit: patient, provider  I discussed the limitations of evaluation and management by telemedicine and the availability of in person appointments. The patient expressed understanding and agreed to proceed.   HPI:  Acute telemedicine visit for Rash: -Onset: started feeling a little sick 4 days ago with a sore throat and a low grade fever, got covid tested through work and was negative, then felt better for a few days -Symptoms include: since last night has had a fever up to a high of 102, headache, body aches, rash on R leg and a sore on her heal (which she has had for a long time), swollen lymph node in R groin -she had a tick in L groin 2 weeks ago -had achilles surgery in December,  developed non-healing wound on that heal since, worse this last week with redness around it -Denies:sinsu congestion, sore throat any longer, SOB, cough, rash elsewhere -Pertinent past medical history:  diabetes, obesity -Pertinent medication allergies:nkda -fully vaccinated for COVID19  ROS: See pertinent positives and negatives per HPI.  Past Medical History:  Diagnosis Date  . Diabetes mellitus    type 2- uncontrolled  . Hyperlipidemia   . Hypertension   . Lumbar disc disease   . Obesity     Past Surgical History:  Procedure Laterality Date  . CHOLECYSTECTOMY  '96   Lap  . ESI     lumbar spine, spinal injections  . EXCISION HAGLUND'S DEFORMITY WITH ACHILLES TENDON REPAIR Right 07/23/2019   Procedure: Right Achilles Tendon Reconstruction, Excision of Haglund Deformity;  Surgeon: Wylene Simmer, MD;  Location: [place];  Service: Orthopedics;  Laterality: Right;  . GASTROC  RECESSION EXTREMITY Right 07/23/2019   Procedure: Gastroc Recession;  Surgeon: Wylene Simmer, MD;  Location: [place];  Service: Orthopedics;  Laterality: Right;  . REDUCTION MAMMAPLASTY Bilateral   . reduction mammoplasty bilaterally       Current Outpatient Medications:  .  ACCU-CHEK FASTCLIX LANCETS MISC, Use twice a day to check blood sugars Dx. E11.9, Disp: 102 each, Rfl: 3 .  atorvastatin (LIPITOR) 80 MG tablet, TAKE 1 TABLET BY MOUTH ONCE A DAY, Disp: 90 tablet, Rfl: 3 .  Blood Glucose Monitoring Suppl (ACCU-CHEK AVIVA PLUS) w/Device KIT, Use to check blood sugars twice a day Dx E11.9, Disp: 1 kit, Rfl: 0 .  FREESTYLE LITE test strip, USE TO TEST CHECK BLOOD SUGAR TWICE DAILY, Disp: 100 strip, Rfl: 5 .  FreeStyle Unistick II Lancets MISC, Use to check blood sugars twice a day, Disp: 100 each, Rfl: 0 .  gabapentin (NEURONTIN) 300 MG capsule, Take 1 capsule (300 mg total) by mouth 4 (four) times daily., Disp: 360 capsule, Rfl: 1 .  Insulin Glargine (BASAGLAR KWIKPEN) 100 UNIT/ML, Inject 0.5 mLs (50 Units total) into the skin 2 (two) times daily., Disp: 30 mL, Rfl: 11 .  JARDIANCE 25 MG TABS tablet, TAKE 1 TABLET BY MOUTH ONCE A DAY, Disp: 90 tablet, Rfl: 1 .  lisinopril-hydrochlorothiazide (ZESTORETIC) 20-25 MG tablet, TAKE 1 TABLET BY MOUTH ONCE DAILY, Disp: 90 tablet, Rfl: 1 .  metFORMIN (GLUCOPHAGE) 1000 MG tablet, TAKE 1 TABLET BY MOUTH TWICE DAILY, Disp: 180 tablet, Rfl: 1 .  sertraline (ZOLOFT)  100 MG tablet, TAKE 1 TABLET BY MOUTH ONCE DAILY, Disp: 90 tablet, Rfl: 3 .  UNIFINE PENTIPS 32G X 4 MM MISC, USE AS DIRECTED TWICE DAILY, Disp: 100 each, Rfl: 1  EXAM:  VITALS per patient if applicable: See HPI  GENERAL: alert, oriented, appears well and in no acute distress  HEENT: atraumatic, conjunttiva clear, no obvious abnormalities on inspection of external nose and ears  NECK: normal movements of the head and neck  LUNGS: on inspection no signs of respiratory  distress, breathing rate appears normal, no obvious gross SOB, gasping or wheezing  CV: no obvious cyanosis   SKIN: Exam somewhat limited due to poor video quality, however she has what looks to be a blotchy erythematous rash around the right ankle and right shin and a large area of redness and cracking skin around the right heel  MS: moves all visible extremities without noticeable abnormality  PSYCH/NEURO: pleasant and cooperative, no obvious depression or anxiety, speech and thought processing grossly intact  ASSESSMENT AND PLAN:  Discussed the following assessment and plan:  Fever, unspecified fever cause  Rash  Skin lesion  Tick bite, initial encounter  DM (diabetes mellitus), type 2, uncontrolled w/neurologic complication (HCC)  Body aches  -we discussed possible serious and likely etiologies, options for evaluation and workup, limitations of telemedicine visit vs in person visit, treatment, treatment risks and precautions. Pt prefers to treat via telemedicine empirically rather than in person at this moment.  However, given the degree of symptoms and multiple possible etiologies advised in person evaluation as soon as possible.  Query cellulitis, versus deeper infection from the nonhealing wound on her heel, she is at particular risk given her history of diabetes.  She is unsure of what her sugars have been recently.  She also has a history of a recent tick bite, so tickborne illness is also a possible cause of her fever.  Of course there are number of respiratory illnesses going around currently also, however she currently has no respiratory symptoms.  Discussed options for in person exam and advised could try the [facility] med center versus one of the local urgent cares.  She prefers to avoid the emergency room and has decided to go to a [facility] urgent care.  She agrees to go promptly today.     I discussed the assessment and treatment plan with the patient. The patient was  provided an opportunity to ask questions and all were answered. The patient agreed with the plan and demonstrated an understanding of the instructions.     Donna Kern, DO

## 2020-05-17 NOTE — Discharge Instructions (Signed)
Labs for RMSF and lyme drawn. Start doxycycline to cover for tick borne disease and possible cellulitis. Continue tylenol/ibuprofen for pain and fever. Follow up with PCP for recheck and further workup if fever does not resolve. If worsening symptoms, weakness, dizziness, fever not decreasing, passing out, go to the emergency department for further evaluation.

## 2020-05-17 NOTE — ED Triage Notes (Signed)
Pt c/o fever 101.7 last night and 102 today and noticed a red rash to rt lower leg last night. States had a low grade temp last Friday with a sore throat and a neg covid test. States pulled a tick off her lt side 3 weeks ago.

## 2020-05-17 NOTE — ED Provider Notes (Signed)
[name]    CSN: [id number] Arrival date & time: 05/17/20  1336      History   Chief Complaint Chief Complaint  Patient presents with   Fever    HPI Donna [name] is a 54 y.o. female.   54 year old female with history of IDDM, HLD, HTN comes in for fever starting last night.  T-max 102, responsive to antipyretic.  At the time, also noticed red rash to the right lower leg.  States had sore throat with low-grade temp 4 days ago, negative Covid test at the time.  Currently without any URI symptoms.  Does have some headache, joint pain, body aches.  Nausea when having fever, no vomiting.  Denies abdominal pain, diarrhea.  Denies any urinary changes.  Did pull off to take to her left hip 3 weeks ago.  Unknown length of attachment.     Past Medical History:  Diagnosis Date   Diabetes mellitus    type 2- uncontrolled   Hyperlipidemia    Hypertension    Lumbar disc disease    Obesity     Patient Active Problem List   Diagnosis Date Noted   Diabetic neuropathy ([place]) 12/01/2014   Routine general medical examination at a health care facility 07/18/2013   Restless leg syndrome 06/02/2012   DM (diabetes mellitus), type 2, uncontrolled w/neurologic complication ([place]) 63/84/6659   Hyperlipidemia associated with type 2 diabetes mellitus ([place]) 08/22/2010   OBESITY, CLASS III 08/22/2010   Depression 08/22/2010   Essential hypertension 08/22/2010   GERD (gastroesophageal reflux disease) 08/22/2010    Past Surgical History:  Procedure Laterality Date   CHOLECYSTECTOMY  '96   Lap   ESI     lumbar spine, spinal injections   EXCISION HAGLUND'S DEFORMITY WITH ACHILLES TENDON REPAIR Right 07/23/2019   Procedure: Right Achilles Tendon Reconstruction, Excision of Haglund Deformity;  Surgeon: Wylene Simmer, MD;  Location: [place];  Service: Orthopedics;  Laterality: Right;   GASTROC RECESSION EXTREMITY Right 07/23/2019   Procedure:  Gastroc Recession;  Surgeon: Wylene Simmer, MD;  Location: [place];  Service: Orthopedics;  Laterality: Right;   REDUCTION MAMMAPLASTY Bilateral    reduction mammoplasty bilaterally      OB History   No obstetric history on file.      Home Medications    Prior to Admission medications   Medication Sig Start Date End Date Taking? Authorizing Provider  ACCU-CHEK FASTCLIX LANCETS MISC Use twice a day to check blood sugars Dx. E11.9 10/09/16   Hoyt Koch, MD  atorvastatin (LIPITOR) 80 MG tablet TAKE 1 TABLET BY MOUTH ONCE A DAY 03/14/20   Hoyt Koch, MD  Blood Glucose Monitoring Suppl (ACCU-CHEK AVIVA PLUS) w/Device KIT Use to check blood sugars twice a day Dx E11.9 10/09/16   Hoyt Koch, MD  doxycycline (VIBRAMYCIN) 100 MG capsule Take 1 capsule (100 mg total) by mouth 2 (two) times daily. 05/17/20   Ok Edwards, PA-C  FREESTYLE LITE test strip USE TO TEST CHECK BLOOD SUGAR TWICE DAILY 05/02/20   Hoyt Koch, MD  FreeStyle Unistick II Lancets MISC Use to check blood sugars twice a day 11/03/19   Hoyt Koch, MD  gabapentin (NEURONTIN) 300 MG capsule Take 1 capsule (300 mg total) by mouth 4 (four) times daily. 04/07/20   Biagio Borg, MD  Insulin Glargine [facility]) 100 UNIT/ML Inject 0.5 mLs (50 Units total) into the skin 2 (two) times daily. 11/30/19  Hoyt Koch, MD  JARDIANCE 25 MG TABS tablet TAKE 1 TABLET BY MOUTH ONCE A DAY 01/29/20   Hoyt Koch, MD  lisinopril-hydrochlorothiazide (ZESTORETIC) 20-25 MG tablet TAKE 1 TABLET BY MOUTH ONCE DAILY 01/29/20   Hoyt Koch, MD  metFORMIN (GLUCOPHAGE) 1000 MG tablet TAKE 1 TABLET BY MOUTH TWICE DAILY 01/29/20   Hoyt Koch, MD  sertraline (ZOLOFT) 100 MG tablet TAKE 1 TABLET BY MOUTH ONCE DAILY 06/03/19   Hoyt Koch, MD  UNIFINE PENTIPS 32G X 4 MM MISC USE AS DIRECTED TWICE DAILY 01/29/20   Hoyt Koch, MD     Family History Family History  Problem Relation Age of Onset   Other Father        CHF   Coronary artery disease Father    Cancer Father        renal cell carcinoma   Hypertension Father    Diabetes Father    Diabetes Mother    Coronary artery disease Mother    Heart attack Mother    Cancer Paternal Aunt        Breast   Breast cancer Paternal Aunt 39   Atrial fibrillation Other    Peripheral vascular disease Other    Heart disease Other     Social History Social History   Tobacco Use   Smoking status: Never Smoker   Smokeless tobacco: Never Used  Substance Use Topics   Alcohol use: No   Drug use: No     Allergies   Patient has no known allergies.   Review of Systems Review of Systems  Reason unable to perform ROS: See HPI as above.     Physical Exam Triage Vital Signs ED Triage Vitals  Enc Vitals Group     BP 05/17/20 1353 133/76     Pulse Rate 05/17/20 1353 (!) 114     Resp 05/17/20 1353 18     Temp 05/17/20 1353 (!) 101.9 F (38.8 C)     Temp Source 05/17/20 1353 Oral     SpO2 05/17/20 1353 95 %     Weight --      Height --      Head Circumference --      Peak Flow --      Pain Score 05/17/20 1354 0     Pain Loc --      Pain Edu? --      Excl. in [place]? --    No data found.  Updated Vital Signs BP 133/76 (BP Location: Left Arm)    Pulse (!) 114    Temp (!) 102.7 F (39.3 C) (Oral)    Resp 18    LMP 09/27/2011    SpO2 95%   Physical Exam Constitutional:      General: She is not in acute distress.    Appearance: Normal appearance. She is well-developed. She is not toxic-appearing or diaphoretic.  HENT:     Head: Normocephalic and atraumatic.  Eyes:     Conjunctiva/sclera: Conjunctivae normal.     Pupils: Pupils are equal, round, and reactive to light.  Cardiovascular:     Rate and Rhythm: Regular rhythm. Tachycardia present.  Pulmonary:     Effort: Pulmonary effort is normal. No respiratory distress.     Comments:  LCTAB Musculoskeletal:     Cervical back: Normal range of motion and neck supple.  Skin:    General: Skin is warm and dry.     Comments: See picture below. Erythema  with warmth and mild swelling to the right lower leg. Mild tenderness. No induration, fluctuance. No other rashes to the body.  Small scarring to the left anterior hip from tick bite. No signs of erythema migrans.  Does have right heel wound without any drainage, erythema, warmth.   Neurological:     Mental Status: She is alert and oriented to person, place, and time.          UC Treatments / Results  Labs (all labs ordered are listed, but only abnormal results are displayed) [place] MTN SPOTTED FVR ABS PNL(IGG+IGM)    EKG   Radiology No results found.  Procedures Procedures (including critical care time)  Medications Ordered in UC Medications  acetaminophen (TYLENOL) tablet 975 mg (975 mg Oral Given 05/17/20 1358)    Initial Impression / Assessment and Plan / UC Course  I have reviewed the triage vital signs and the nursing notes.  Pertinent labs & imaging results that were available during my care of the patient were reviewed by me and considered in my medical decision making (see chart for details).    Patient febrile at 101.9 in office, tylenol provided. Nontoxic in appearance. slightly tachycardia, likely due to fever. Stable BP. Low suspicion for sepsis. LCTAB. ?cellulitis to the right lower leg with fast onset of rash. For now, will draw RMSF and lyme for further evaluation given tick bite. Will start doxycycline to cover for tick born disease and cellulitis. Push fluids and continue to monitor fever. Strict return precautions given. Patient expresses understanding and agrees to plan.  Final Clinical Impressions(s) / UC Diagnoses   Final diagnoses:  Fever, unspecified  Tick bite, initial encounter  Rash and nonspecific skin eruption   ED Prescriptions     Medication Sig Dispense Auth. Provider   doxycycline (VIBRAMYCIN) 100 MG capsule Take 1 capsule (100 mg total) by mouth 2 (two) times daily. 14 capsule Ok Edwards, PA-C     PDMP not reviewed this encounter.   Ok Edwards, PA-C 05/17/20 1448

## 2020-05-19 ENCOUNTER — Encounter: Payer: Self-pay | Admitting: Family Medicine

## 2020-05-19 ENCOUNTER — Encounter: Payer: Self-pay | Admitting: Internal Medicine

## 2020-05-19 ENCOUNTER — Telehealth: Payer: Self-pay | Admitting: Internal Medicine

## 2020-05-19 LAB — ROCKY MTN SPOTTED FVR ABS PNL(IGG+IGM)
RMSF IgG: NEGATIVE
RMSF IgM: 0.79 index (ref 0.00–0.89)

## 2020-05-19 LAB — B. BURGDORFI ANTIBODIES: Lyme IgG/IgM Ab: <0.91 {ISR} (ref 0.00–0.90)

## 2020-05-19 NOTE — Telephone Encounter (Signed)
Patient seen on telehealth and urgent care on Tuesday 9.28.21, Still swelling, red rash towards the shin developed in cellulitis  She needs an another appointment, or does she need another antibiotic  Patient is okay with sending pictures if needed of how it looks today, but needs to see someone.  Please call the patient.

## 2020-05-19 NOTE — Telephone Encounter (Signed)
Called pt, Donna [name].   Per Dr. Yetta Barre the pt can send pictures via MyChart for him to review. He will be be able to make a proper recommendation after.

## 2020-06-02 ENCOUNTER — Other Ambulatory Visit: Payer: Self-pay | Admitting: Family Medicine

## 2020-06-02 ENCOUNTER — Telehealth (INDEPENDENT_AMBULATORY_CARE_PROVIDER_SITE_OTHER): Payer: 59 | Admitting: Family Medicine

## 2020-06-02 DIAGNOSIS — R21 Rash and other nonspecific skin eruption: Secondary | ICD-10-CM

## 2020-06-02 MED ORDER — DOXYCYCLINE HYCLATE 100 MG PO TABS: 100.0000 mg | ORAL_TABLET | Freq: Two times a day (BID) | ORAL | 0 refills | Status: DC

## 2020-06-02 MED FILL — DOXYCYCLINE HYCLATE 100 MG: 100 | 10 days supply | Qty: 20 | Fill #0

## 2020-06-02 NOTE — Patient Instructions (Signed)
-  I sent the medication(s) we discussed to your pharmacy: Meds ordered this encounter  Medications  . doxycycline (VIBRA-TABS) 100 MG tablet    Sig: Take 1 tablet (100 mg total) by mouth 2 (two) times daily.    Dispense:  20 tablet    Refill:  0    Ideally, it would be best to be seen in person for this.  Certainly, if you have any worsening, new symptoms or if this is not responding to the antibiotic over the next 24 hours, please seek prompt in person evaluation with your primary care doctor or at a local urgent care.  Please schedule a follow-up with your primary care office in person in 5 to 7 days.  I hope you are feeling better soon!

## 2020-06-02 NOTE — Progress Notes (Signed)
Virtual Visit via Video Note  I connected with Donna [name]  on 06/02/20 at  1:20 PM EDT by a video enabled telemedicine application and verified that I am speaking with the correct person using two identifiers.  Location patient: home, [place] Location provider:work or home office Persons participating in the virtual visit: patient, provider  I discussed the limitations of evaluation and management by telemedicine and the availability of in person appointments. The patient expressed understanding and agreed to proceed.   HPI:  Acute telemedicine visit for : -Onset: several weeks ago -evaluated in the ER 9/28 and reports was told was cellulitis (neg RMSF and lyme blood work)- was treated with 5 days of doxy and got better snd almost completely resolved.  -however, when stopped the abx has slowly returned and now got a fever again last night and rash is looking worse today -Symptoms include: red rash on the R leg, fever last night, temp now 99, feeling tired -Denies: cough, congestion, malaise, nausea, vomiting, diarrhea -Pertinent past medical history: hx of surgery on R foot with persistent healing issues -Pertinent medication allergies: nkda   ROS: See pertinent positives and negatives per HPI.  Past Medical History:  Diagnosis Date  . Diabetes mellitus    type 2- uncontrolled  . Hyperlipidemia   . Hypertension   . Lumbar disc disease   . Obesity     Past Surgical History:  Procedure Laterality Date  . CHOLECYSTECTOMY  '96   Lap  . ESI     lumbar spine, spinal injections  . EXCISION HAGLUND'S DEFORMITY WITH ACHILLES TENDON REPAIR Right 07/23/2019   Procedure: Right Achilles Tendon Reconstruction, Excision of Haglund Deformity;  Surgeon: Wylene Simmer, MD;  Location: [facility];  Service: Orthopedics;  Laterality: Right;  . GASTROC RECESSION EXTREMITY Right 07/23/2019   Procedure: Gastroc Recession;  Surgeon: Wylene Simmer, MD;  Location: [place] [facility];   Service: Orthopedics;  Laterality: Right;  . REDUCTION MAMMAPLASTY Bilateral   . reduction mammoplasty bilaterally       Current Outpatient Medications:  .  ACCU-CHEK FASTCLIX LANCETS MISC, Use twice a day to check blood sugars Dx. E11.9, Disp: 102 each, Rfl: 3 .  atorvastatin (LIPITOR) 80 MG tablet, TAKE 1 TABLET BY MOUTH ONCE A DAY, Disp: 90 tablet, Rfl: 3 .  Blood Glucose Monitoring Suppl (ACCU-CHEK AVIVA PLUS) w/Device KIT, Use to check blood sugars twice a day Dx E11.9, Disp: 1 kit, Rfl: 0 .  doxycycline (VIBRA-TABS) 100 MG tablet, Take 1 tablet (100 mg total) by mouth 2 (two) times daily., Disp: 20 tablet, Rfl: 0 .  FREESTYLE LITE test strip, USE TO TEST CHECK BLOOD SUGAR TWICE DAILY, Disp: 100 strip, Rfl: 5 .  FreeStyle Unistick II Lancets MISC, Use to check blood sugars twice a day, Disp: 100 each, Rfl: 0 .  gabapentin (NEURONTIN) 300 MG capsule, Take 1 capsule (300 mg total) by mouth 4 (four) times daily., Disp: 360 capsule, Rfl: 1 .  Insulin Glargine (BASAGLAR KWIKPEN) 100 UNIT/ML, Inject 0.5 mLs (50 Units total) into the skin 2 (two) times daily., Disp: 30 mL, Rfl: 11 .  JARDIANCE 25 MG TABS tablet, TAKE 1 TABLET BY MOUTH ONCE A DAY, Disp: 90 tablet, Rfl: 1 .  lisinopril-hydrochlorothiazide (ZESTORETIC) 20-25 MG tablet, TAKE 1 TABLET BY MOUTH ONCE DAILY, Disp: 90 tablet, Rfl: 1 .  metFORMIN (GLUCOPHAGE) 1000 MG tablet, TAKE 1 TABLET BY MOUTH TWICE DAILY, Disp: 180 tablet, Rfl: 1 .  sertraline (ZOLOFT) 100 MG tablet, TAKE 1  TABLET BY MOUTH ONCE DAILY, Disp: 90 tablet, Rfl: 3 .  UNIFINE PENTIPS 32G X 4 MM MISC, USE AS DIRECTED TWICE DAILY, Disp: 100 each, Rfl: 1  EXAM:  VITALS per patient if applicable:  GENERAL: alert, oriented, appears well and in no acute distress  HEENT: atraumatic, conjunttiva clear, no obvious abnormalities on inspection of external nose and ears  NECK: normal movements of the head and neck  LUNGS: on inspection no signs of respiratory distress,  breathing rate appears normal, no obvious gross SOB, gasping or wheezing  CV: no obvious cyanosis  SKIN: Well demarcated area of erythema and what appears to be mild induration of the skin on the lower right leg, do not appreciate any purulence, break in the skin, streaking  MS: moves all visible extremities without noticeable abnormality  PSYCH/NEURO: pleasant and cooperative, no obvious depression or anxiety, speech and thought processing grossly intact  ASSESSMENT AND PLAN:  Discussed the following assessment and plan:  Skin rash  -we discussed possible serious and likely etiologies, options for evaluation and workup, limitations of telemedicine visit vs in person visit, treatment, treatment risks and precautions. Pt prefers to treat via telemedicine empirically rather than in person at this moment.  However, given her recurrent symptoms and reported fever, did advise that in person evaluation with potential lab work and imaging would be most ideal.  It is possible that she has an unresolved cellulitis, from my experience, extended antibiotics courses are sometimes needed with cellulitis in this part of the body.  Discussed it is also possible that she has an abscess or deeper infection, does not appear that way on exam, but advised that sometimes we cannot tell this just from looking.  The rash looks similar at this point, to some of the pictures she sent into her primary care office recently before completing the antibiotic treatment.  Discussed other potential etiologies as well.  She prefers to try empiric treatment with a longer course of doxycycline 100 mg twice daily for 10 days.  Advised she does need in person recheck in 5 to 7 days and immediate in person evaluation if any worsening or if this is not improving over the next 24 hours.  She is agreeable.  She agrees to contact her PCP office after this appointment to schedule follow-up.  She agrees to seek care at a local urgent care if  she is unable to get in with her PCP office due to the Covid situation.  Advised to seek prompt follow up telemedicine visit or in person care if worsening, new symptoms arise, or if is not improving with treatment. Did let this patient know that I only do telemedicine on Tuesdays and Thursdays for [place]. Advised to schedule follow up visit with PCP or [facility] if any further questions or concerns to avoid delays in care.   I discussed the assessment and treatment plan with the patient. The patient was provided an opportunity to ask questions and all were answered. The patient agreed with the plan and demonstrated an understanding of the instructions.     Lucretia Kern, DO

## 2020-06-03 ENCOUNTER — Encounter: Payer: Self-pay | Admitting: Family Medicine

## 2020-06-09 ENCOUNTER — Encounter: Payer: Self-pay | Admitting: Internal Medicine

## 2020-06-09 ENCOUNTER — Other Ambulatory Visit: Payer: Self-pay

## 2020-06-09 ENCOUNTER — Ambulatory Visit: Payer: 59 | Admitting: Internal Medicine

## 2020-06-09 VITALS — BP 120/70 | HR 81 | Temp 98.5°F | Ht 66.0 in | Wt 226.0 lb

## 2020-06-09 DIAGNOSIS — IMO0002 Reserved for concepts with insufficient information to code with codable children: Secondary | ICD-10-CM

## 2020-06-09 DIAGNOSIS — M7989 Other specified soft tissue disorders: Secondary | ICD-10-CM

## 2020-06-09 DIAGNOSIS — E1165 Type 2 diabetes mellitus with hyperglycemia: Secondary | ICD-10-CM | POA: Diagnosis not present

## 2020-06-09 DIAGNOSIS — L84 Corns and callosities: Secondary | ICD-10-CM | POA: Diagnosis not present

## 2020-06-09 DIAGNOSIS — M79661 Pain in right lower leg: Secondary | ICD-10-CM | POA: Diagnosis not present

## 2020-06-09 DIAGNOSIS — L0390 Cellulitis, unspecified: Secondary | ICD-10-CM | POA: Diagnosis not present

## 2020-06-09 DIAGNOSIS — E1149 Type 2 diabetes mellitus with other diabetic neurological complication: Secondary | ICD-10-CM | POA: Diagnosis not present

## 2020-06-09 DIAGNOSIS — I10 Essential (primary) hypertension: Secondary | ICD-10-CM | POA: Diagnosis not present

## 2020-06-09 LAB — POCT GLYCOSYLATED HEMOGLOBIN (HGB A1C): Hemoglobin A1C: 6.8 % — AB (ref 4.0–5.6)

## 2020-06-09 MED FILL — BASAGLAR 100 UNIT/ML KWIKPE: 100 | 30 days supply | Qty: 30 | Fill #3

## 2020-06-09 NOTE — Progress Notes (Signed)
Subjective:    Patient ID: Donna [name], female    DOB: 1966/03/27, 54 y.o.   MRN: [id number]  HPI  Here to f/u after initial visit to UC after 1 wk worsening leg red, tender swelling tx with doxycycline and overall improved, but swelling for some reason persists to the knee and above.  Also has a very tender right heel hard area with soft tissue pain underneath.  Pt denies chest pain, increased sob or doe, wheezing, orthopnea, PND, increased LE swelling, palpitations, dizziness or syncope.  Pt denies new neurological symptoms such as new headache, or facial or extremity weakness or numbness   Pt denies polydipsia, polyuria, Past Medical History:  Diagnosis Date  . Diabetes mellitus    type 2- uncontrolled  . Hyperlipidemia   . Hypertension   . Lumbar disc disease   . Obesity    Past Surgical History:  Procedure Laterality Date  . CHOLECYSTECTOMY  '96   Lap  . ESI     lumbar spine, spinal injections  . EXCISION HAGLUND'S DEFORMITY WITH ACHILLES TENDON REPAIR Right 07/23/2019   Procedure: Right Achilles Tendon Reconstruction, Excision of Haglund Deformity;  Surgeon: Wylene Simmer, MD;  Location: [facility];  Service: Orthopedics;  Laterality: Right;  . GASTROC RECESSION EXTREMITY Right 07/23/2019   Procedure: Gastroc Recession;  Surgeon: Wylene Simmer, MD;  Location: [facility];  Service: Orthopedics;  Laterality: Right;  . REDUCTION MAMMAPLASTY Bilateral   . reduction mammoplasty bilaterally      reports that she has never smoked. She has never used smokeless tobacco. She reports that she does not drink alcohol and does not use drugs. family history includes Atrial fibrillation in an other family member; Breast cancer (age of onset: 56) in her paternal aunt; Cancer in her father and paternal aunt; Coronary artery disease in her father and mother; Diabetes in her father and mother; Heart attack in her mother; Heart disease in an other family member;  Hypertension in her father; Other in her father; Peripheral vascular disease in an other family member. No Known Allergies Current Outpatient Medications on File Prior to Visit  Medication Sig Dispense Refill  . ACCU-CHEK FASTCLIX LANCETS MISC Use twice a day to check blood sugars Dx. E11.9 102 each 3  . atorvastatin (LIPITOR) 80 MG tablet TAKE 1 TABLET BY MOUTH ONCE A DAY 90 tablet 3  . Blood Glucose Monitoring Suppl (ACCU-CHEK AVIVA PLUS) w/Device KIT Use to check blood sugars twice a day Dx E11.9 1 kit 0  . doxycycline (VIBRA-TABS) 100 MG tablet Take 1 tablet (100 mg total) by mouth 2 (two) times daily. 20 tablet 0  . FREESTYLE LITE test strip USE TO TEST CHECK BLOOD SUGAR TWICE DAILY 100 strip 5  . FreeStyle Unistick II Lancets MISC Use to check blood sugars twice a day 100 each 0  . gabapentin (NEURONTIN) 300 MG capsule Take 1 capsule (300 mg total) by mouth 4 (four) times daily. 360 capsule 1  . Insulin Glargine (BASAGLAR KWIKPEN) 100 UNIT/ML Inject 0.5 mLs (50 Units total) into the skin 2 (two) times daily. 30 mL 11  . JARDIANCE 25 MG TABS tablet TAKE 1 TABLET BY MOUTH ONCE A DAY 90 tablet 1  . lisinopril-hydrochlorothiazide (ZESTORETIC) 20-25 MG tablet TAKE 1 TABLET BY MOUTH ONCE DAILY 90 tablet 1  . metFORMIN (GLUCOPHAGE) 1000 MG tablet TAKE 1 TABLET BY MOUTH TWICE DAILY 180 tablet 1  . sertraline (ZOLOFT) 100 MG tablet TAKE 1 TABLET BY MOUTH  ONCE DAILY 90 tablet 3  . UNIFINE PENTIPS 32G X 4 MM MISC USE AS DIRECTED TWICE DAILY 100 each 1   No current facility-administered medications on file prior to visit.   Review of Systems All otherwise neg per pt    Objective:   Physical Exam BP 120/70 (BP Location: Left Arm, Patient Position: Sitting, Cuff Size: Large)   Pulse 81   Temp 98.5 F (36.9 C) (Oral)   Ht 5' 6" (1.676 m)   Wt 226 lb (102.5 kg)   LMP 09/27/2011   SpO2 97%   BMI 36.48 kg/m  VS noted,  Constitutional: Pt appears in NAD HENT: Head: NCAT.  Right Ear: External  ear normal.  Left Ear: External ear normal.  Eyes: . Pupils are equal, round, and reactive to light. Conjunctivae and EOM are normal Nose: without d/c or deformity Neck: Neck supple. Gross normal ROM Cardiovascular: Normal rate and regular rhythm.   Pulmonary/Chest: Effort normal and breath sounds without rales or wheezing.  Abd:  Soft, NT, ND, + BS, no organomegaly Neurological: Pt is alert. At baseline orientation, motor grossly intact Skin: Skin is warm. No rashes, other new lesions, no LE edema Psychiatric: Pt behavior is normal without agitation  All otherwise neg per pt  Lab Results  Component Value Date   HGBA1C 7.0 (H) 11/30/2019  Contains abnormal dataPOCT glycosylated hemoglobin (Hb A1C) Order: [id number] Status:  Final result Visible to patient:  Yes (not seen) Dx:  DM (diabetes mellitus), type 2, uncon...  0 Result Notes   1 HM Topic  Ref Range & Units 16:52  (06/09/20) 6 mo ago  (11/30/19) 1 yr ago  (01/29/19) 2 yr ago  (05/29/18) 2 yr ago  (06/27/17) 3 yr ago  (01/23/17) 3 yr ago  (09/13/16)  Hemoglobin A1C 4.0 - 5.6 % 6.8Abnormal  7.0High R, CM  7.5High R, CM  9.3High R, CM  10.7High R, CM  12.3High R, CM  11.4 Repeated           Assessment & Plan:

## 2020-06-09 NOTE — Patient Instructions (Signed)
Please finish the doxycycline  Your A1c was OK today  You will be contacted regarding the referral for: venous doppler for tomorrow at [facility]  Please continue all other medications as before, and refills have been done if requested.  Please have the pharmacy call with any other refills you may need.  Please continue your efforts at being more active, low cholesterol diabetic diet, and weight control.  Please keep your appointments with your specialists as you may have planned  You will be contacted regarding the referral for: Podiatry  Please see Dr Okey Dupre in follow up, but no later than 6 months

## 2020-06-10 ENCOUNTER — Telehealth: Payer: Self-pay

## 2020-06-10 ENCOUNTER — Ambulatory Visit (HOSPITAL_COMMUNITY)
Admission: RE | Admit: 2020-06-10 | Discharge: 2020-06-10 | Disposition: A | Discharge status: Home / Self Care | Payer: 59 | Source: Ambulatory Visit | Attending: Internal Medicine | Admitting: Internal Medicine

## 2020-06-10 DIAGNOSIS — M79661 Pain in right lower leg: Secondary | ICD-10-CM | POA: Insufficient documentation

## 2020-06-10 DIAGNOSIS — M7989 Other specified soft tissue disorders: Secondary | ICD-10-CM | POA: Insufficient documentation

## 2020-06-10 NOTE — Telephone Encounter (Signed)
Negative for DVT or superficial thrombosis.  Large lymph nodes in groin (normal for pt)

## 2020-06-11 ENCOUNTER — Encounter: Payer: Self-pay | Admitting: Internal Medicine

## 2020-06-11 MED FILL — FREESTYLE LITE TEST STRIP: 50 days supply | Qty: 100 | Fill #1

## 2020-06-15 ENCOUNTER — Encounter: Payer: Self-pay | Admitting: Internal Medicine

## 2020-06-15 DIAGNOSIS — M79604 Pain in right leg: Secondary | ICD-10-CM | POA: Insufficient documentation

## 2020-06-15 DIAGNOSIS — M7989 Other specified soft tissue disorders: Secondary | ICD-10-CM | POA: Insufficient documentation

## 2020-06-15 NOTE — Assessment & Plan Note (Signed)
stable overall by history and exam, recent data reviewed with pt, and pt to continue medical treatment as before,  to f/u any worsening symptoms or concerns  

## 2020-06-15 NOTE — Assessment & Plan Note (Signed)
For podiatry referral 

## 2020-06-15 NOTE — Assessment & Plan Note (Addendum)
Also for r/o DVT - for LE venous dopplers  I spent 41 minutes in preparing to see the patient by review of recent labs, imaging and procedures, obtaining and reviewing separately obtained history, communicating with the patient and family or caregiver, ordering medications, tests or procedures, and documenting clinical information in the EHR including the differential Dx, treatment, and any further evaluation and other management of right leg swelling, cellulitis, dm, heel pain, htn

## 2020-06-15 NOTE — Assessment & Plan Note (Signed)
Improved, to finish doxycycline

## 2020-06-17 ENCOUNTER — Other Ambulatory Visit: Payer: Self-pay

## 2020-06-17 ENCOUNTER — Other Ambulatory Visit: Payer: Self-pay | Admitting: Internal Medicine

## 2020-06-17 ENCOUNTER — Ambulatory Visit: Payer: 59 | Admitting: Podiatry

## 2020-06-17 ENCOUNTER — Encounter: Payer: Self-pay | Admitting: Podiatry

## 2020-06-17 ENCOUNTER — Other Ambulatory Visit: Payer: Self-pay | Admitting: Podiatry

## 2020-06-17 DIAGNOSIS — R234 Changes in skin texture: Secondary | ICD-10-CM

## 2020-06-17 DIAGNOSIS — L853 Xerosis cutis: Secondary | ICD-10-CM

## 2020-06-17 MED ORDER — AMMONIUM LACTATE 12 % EX LOTN: 1.0000 "application " | TOPICAL_LOTION | CUTANEOUS | 0 refills | Status: DC | PRN

## 2020-06-17 MED FILL — AMMONIUM LACTATE 12% LOTION: 12 | 20 days supply | Qty: 400 | Fill #0

## 2020-06-18 ENCOUNTER — Other Ambulatory Visit: Payer: Self-pay | Admitting: Internal Medicine

## 2020-06-18 MED FILL — SERTRALINE HCL 100 MG TABS: 100 | 90 days supply | Qty: 90 | Fill #0

## 2020-06-19 ENCOUNTER — Encounter: Payer: Self-pay | Admitting: Podiatry

## 2020-06-19 NOTE — Progress Notes (Signed)
Subjective:  Patient ID: Donna [name], female    DOB: 09-Jan-1966,  MRN: [id number]  Chief Complaint  Patient presents with  . Callouses    Right heel PT stated that she is doing and has some pain to the heel     54 y.o. female presents with the above complaint.  Patient presents with complaint of right heel lateral foot with severe dry skin/callus.  Patient has a history of Achilles tendon rupture for which it was primarily repaired after that this callus formed and now she has tried many different over-the-counter topical application which none of them has helped.  She at this time would like to discuss if there is anything else that could be done.  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  She is worried that this may lead into an open ulceration.  She denies any other acute complaints.  She is is a type II diabetic with a last A1c of 6.8.   Review of Systems: Negative except as noted in the HPI. Denies N/V/F/Ch.  Past Medical History:  Diagnosis Date  . Diabetes mellitus    type 2- uncontrolled  . Hyperlipidemia   . Hypertension   . Lumbar disc disease   . Obesity     Current Outpatient Medications:  .  ACCU-CHEK FASTCLIX LANCETS MISC, Use twice a day to check blood sugars Dx. E11.9, Disp: 102 each, Rfl: 3 .  ammonium lactate (AMLACTIN) 12 % lotion, Apply 1 application topically as needed for dry skin., Disp: 400 g, Rfl: 0 .  atorvastatin (LIPITOR) 80 MG tablet, TAKE 1 TABLET BY MOUTH ONCE A DAY, Disp: 90 tablet, Rfl: 3 .  Blood Glucose Monitoring Suppl (ACCU-CHEK AVIVA PLUS) w/Device KIT, Use to check blood sugars twice a day Dx E11.9, Disp: 1 kit, Rfl: 0 .  doxycycline (VIBRA-TABS) 100 MG tablet, Take 1 tablet (100 mg total) by mouth 2 (two) times daily., Disp: 20 tablet, Rfl: 0 .  FREESTYLE LITE test strip, USE TO TEST CHECK BLOOD SUGAR TWICE DAILY, Disp: 100 strip, Rfl: 5 .  FreeStyle Unistick II Lancets MISC, Use to check blood sugars twice  a day, Disp: 100 each, Rfl: 0 .  gabapentin (NEURONTIN) 300 MG capsule, Take 1 capsule (300 mg total) by mouth 4 (four) times daily., Disp: 360 capsule, Rfl: 1 .  Insulin Glargine (BASAGLAR KWIKPEN) 100 UNIT/ML, Inject 0.5 mLs (50 Units total) into the skin 2 (two) times daily., Disp: 30 mL, Rfl: 11 .  JARDIANCE 25 MG TABS tablet, TAKE 1 TABLET BY MOUTH ONCE A DAY, Disp: 90 tablet, Rfl: 1 .  lisinopril-hydrochlorothiazide (ZESTORETIC) 20-25 MG tablet, TAKE 1 TABLET BY MOUTH ONCE DAILY, Disp: 90 tablet, Rfl: 1 .  metFORMIN (GLUCOPHAGE) 1000 MG tablet, TAKE 1 TABLET BY MOUTH TWICE DAILY, Disp: 180 tablet, Rfl: 1 .  sertraline (ZOLOFT) 100 MG tablet, TAKE 1 TABLET BY MOUTH ONCE DAILY, Disp: 90 tablet, Rfl: 0 .  UNIFINE PENTIPS 32G X 4 MM MISC, USE AS DIRECTED TWICE DAILY, Disp: 100 each, Rfl: 1  Social History   Tobacco Use  Smoking Status Never Smoker  Smokeless Tobacco Never Used    No Known Allergies Objective:  There were no vitals filed for this visit. There is no height or weight on file to calculate BMI. Constitutional Well developed. Well nourished.  Vascular Dorsalis pedis pulses palpable bilaterally. Posterior tibial pulses palpable bilaterally. Capillary refill normal to all digits.  No cyanosis  or clubbing noted. Pedal hair growth normal.  Neurologic Normal speech. Oriented to person, place, and time. Epicritic sensation to light touch grossly present bilaterally.  Dermatologic  hyperkeratotic lesion noted to the right lateral hindfoot/heel.  There is severe xerosis noted to the lateral aspect of the heel.  There is no subjective complaint of itching associated with it.  There is a deep fissure of the skin noted.  Pain on palpation to this lesion  Orthopedic: Normal joint ROM without pain or crepitus bilaterally. No visible deformities. No bony tenderness.   Radiographs: None Assessment:   1. Xerosis cutis   2. Fissure in skin of foot    Plan:  Patient was evaluated  and treated and all questions answered.  Right heel severe xerosis with underlying skin fissure -I explained to patient the etiology of xerosis and various treatment options were extensively discussed.  I believe patient will benefit from ammonium lactate to help decrease the xerotic skin and therefore allow the fissure to heal appropriately. -Ammonium lactate was dispensed to pharmacy patient will apply twice a day.  She states understanding  No follow-ups on file.

## 2020-06-20 MED FILL — ATORVASTATIN 80 MG TABLET: 80 | 90 days supply | Qty: 90 | Fill #1

## 2020-06-22 ENCOUNTER — Encounter: Payer: Self-pay | Admitting: Adult Health

## 2020-06-22 ENCOUNTER — Other Ambulatory Visit: Payer: Self-pay | Admitting: Adult Health

## 2020-06-22 ENCOUNTER — Telehealth (INDEPENDENT_AMBULATORY_CARE_PROVIDER_SITE_OTHER): Payer: 59 | Admitting: Adult Health

## 2020-06-22 DIAGNOSIS — L03115 Cellulitis of right lower limb: Secondary | ICD-10-CM

## 2020-06-22 MED ORDER — CEPHALEXIN 500 MG PO CAPS: 500.0000 mg | ORAL_CAPSULE | Freq: Four times a day (QID) | ORAL | 0 refills | Status: DC

## 2020-06-22 MED FILL — CEPHALEXIN 500 MG CAPSULE: 500 | 10 days supply | Qty: 40 | Fill #0

## 2020-06-22 NOTE — Progress Notes (Signed)
Virtual Visit via Video Note  I connected with Donna [name] on 06/22/20 at  3:30 PM EDT by a video enabled telemedicine application and verified that I am speaking with the correct person using two identifiers.  Location patient: home Location provider:work or home office Persons participating in the virtual visit: patient, provider  I discussed the limitations of evaluation and management by telemedicine and the availability of in person appointments. The patient expressed understanding and agreed to proceed.   HPI:  55 year old female who is being evaluated today for recurrent cellulitis.  Is originally evaluated in the emergency room on 05/17/2020 for fever and was told that she has cellulitis.  She was treated with 5 days of doxycycline and her symptoms got better and almost completely resolved however when she stopped antibiotics her symptoms slowly returned.  On 06/02/2020 he had a virtual visit with another provider and was started on a 10-day course of doxycycline.  Seven  days later she was seen again although that her cellulitis was improving due to the doxycycline she had persistent swelling to the knee and above.  At this time she had a DVT rule out which was negative except for an enlarged lymph node in her groin.  Today she reports that unfortunately again that the doxycycline never completely resolved her cellulitis since she has finished her course her cellulitis is slowly come back to her right lower extremity.  She endorses having a fever up to 100.1 and some mild swelling to the right lower extremity.  Se also continues to have an enlarged lymph node in her groin    ROS: See pertinent positives and negatives per HPI.  Past Medical History:  Diagnosis Date   Diabetes mellitus    type 2- uncontrolled   Hyperlipidemia    Hypertension    Lumbar disc disease    Obesity     Past Surgical History:  Procedure Laterality Date   CHOLECYSTECTOMY  '96   Lap   ESI      lumbar spine, spinal injections   EXCISION HAGLUND'S DEFORMITY WITH ACHILLES TENDON REPAIR Right 07/23/2019   Procedure: Right Achilles Tendon Reconstruction, Excision of Haglund Deformity;  Surgeon: Wylene Simmer, MD;  Location: [facility];  Service: Orthopedics;  Laterality: Right;   GASTROC RECESSION EXTREMITY Right 07/23/2019   Procedure: Gastroc Recession;  Surgeon: Wylene Simmer, MD;  Location: [facility];  Service: Orthopedics;  Laterality: Right;   REDUCTION MAMMAPLASTY Bilateral    reduction mammoplasty bilaterally      Family History  Problem Relation Age of Onset   Other Father        CHF   Coronary artery disease Father    Cancer Father        renal cell carcinoma   Hypertension Father    Diabetes Father    Diabetes Mother    Coronary artery disease Mother    Heart attack Mother    Cancer Paternal Aunt        Breast   Breast cancer Paternal Aunt 67   Atrial fibrillation Other    Peripheral vascular disease Other    Heart disease Other        Current Outpatient Medications:    ACCU-CHEK FASTCLIX LANCETS MISC, Use twice a day to check blood sugars Dx. E11.9, Disp: 102 each, Rfl: 3   ammonium lactate (AMLACTIN) 12 % lotion, Apply 1 application topically as needed for dry skin., Disp: 400 g, Rfl: 0   atorvastatin (LIPITOR) 80 MG  tablet, TAKE 1 TABLET BY MOUTH ONCE A DAY, Disp: 90 tablet, Rfl: 3   Blood Glucose Monitoring Suppl (ACCU-CHEK AVIVA PLUS) w/Device KIT, Use to check blood sugars twice a day Dx E11.9, Disp: 1 kit, Rfl: 0   FREESTYLE LITE test strip, USE TO TEST CHECK BLOOD SUGAR TWICE DAILY, Disp: 100 strip, Rfl: 5   FreeStyle Unistick II Lancets MISC, Use to check blood sugars twice a day, Disp: 100 each, Rfl: 0   gabapentin (NEURONTIN) 300 MG capsule, Take 1 capsule (300 mg total) by mouth 4 (four) times daily., Disp: 360 capsule, Rfl: 1   Insulin Glargine (BASAGLAR KWIKPEN) 100 UNIT/ML, Inject 0.5 mLs (50  Units total) into the skin 2 (two) times daily., Disp: 30 mL, Rfl: 11   JARDIANCE 25 MG TABS tablet, TAKE 1 TABLET BY MOUTH ONCE A DAY, Disp: 90 tablet, Rfl: 1   lisinopril-hydrochlorothiazide (ZESTORETIC) 20-25 MG tablet, TAKE 1 TABLET BY MOUTH ONCE DAILY, Disp: 90 tablet, Rfl: 1   metFORMIN (GLUCOPHAGE) 1000 MG tablet, TAKE 1 TABLET BY MOUTH TWICE DAILY, Disp: 180 tablet, Rfl: 1   sertraline (ZOLOFT) 100 MG tablet, TAKE 1 TABLET BY MOUTH ONCE DAILY, Disp: 90 tablet, Rfl: 0   UNIFINE PENTIPS 32G X 4 MM MISC, USE AS DIRECTED TWICE DAILY, Disp: 100 each, Rfl: 1   cephALEXin (KEFLEX) 500 MG capsule, Take 1 capsule (500 mg total) by mouth 4 (four) times daily for 10 days., Disp: 40 capsule, Rfl: 0   doxycycline (VIBRA-TABS) 100 MG tablet, Take 1 tablet (100 mg total) by mouth 2 (two) times daily. (Patient not taking: Reported on 06/22/2020), Disp: 20 tablet, Rfl: 0  EXAM:  VITALS per patient if applicable:  GENERAL: alert, oriented, appears well and in no acute distress  HEENT: atraumatic, conjunttiva clear, no obvious abnormalities on inspection of external nose and ears  NECK: normal movements of the head and neck  LUNGS: on inspection no signs of respiratory distress, breathing rate appears normal, no obvious gross SOB, gasping or wheezing  CV: no obvious cyanosis  MS: moves all visible extremities without noticeable abnormality  SKIN: Redness noted to right lower extremity.   PSYCH/NEURO: pleasant and cooperative, no obvious depression or anxiety, speech and thought processing grossly intact  ASSESSMENT AND PLAN:  Discussed the following assessment and plan:  1. Cellulitis of right lower extremity -We will switch to Keflex 500 mg 4 times daily for 10 days and due to recurrent nature will refer over to infectious disease in case she needs further evaluation.  She was advised to follow-up if no improvement over the next 2 to 3 days or sooner if symptoms worse - cephALEXin  (KEFLEX) 500 MG capsule; Take 1 capsule (500 mg total) by mouth 4 (four) times daily for 10 days.  Dispense: 40 capsule; Refill: 0 - Ambulatory referral to Infectious Disease  I discussed the assessment and treatment plan with the patient. The patient was provided an opportunity to ask questions and all were answered. The patient agreed with the plan and demonstrated an understanding of the instructions.   The patient was advised to call back or seek an in-person evaluation if the symptoms worsen or if the condition fails to improve as anticipated.   Dorothyann Peng, NP

## 2020-06-23 ENCOUNTER — Ambulatory Visit: Payer: 59 | Admitting: Internal Medicine

## 2020-06-23 ENCOUNTER — Encounter: Payer: Self-pay | Admitting: Internal Medicine

## 2020-06-23 ENCOUNTER — Other Ambulatory Visit: Payer: Self-pay | Admitting: Internal Medicine

## 2020-06-23 ENCOUNTER — Other Ambulatory Visit: Payer: Self-pay

## 2020-06-23 VITALS — BP 145/83 | HR 97 | Wt 220.0 lb

## 2020-06-23 DIAGNOSIS — L03115 Cellulitis of right lower limb: Secondary | ICD-10-CM | POA: Diagnosis not present

## 2020-06-23 MED ORDER — CEPHALEXIN 500 MG PO CAPS: 500.0000 mg | ORAL_CAPSULE | Freq: Four times a day (QID) | ORAL | 0 refills | Status: DC

## 2020-06-23 NOTE — Progress Notes (Signed)
[facility] for Infectious Disease  Reason for Consult:cellulitis Referring Provider: Nani Skillern     Patient Active Problem List   Diagnosis Date Noted  . Pain and swelling of right lower leg 06/15/2020  . Cellulitis 06/09/2020  . Pre-ulcerative calluses 06/09/2020  . Diabetic neuropathy ([place]) 12/01/2014  . Routine general medical examination at a health care facility 07/18/2013  . Restless leg syndrome 06/02/2012  . DM (diabetes mellitus), type 2, uncontrolled w/neurologic complication ([place]) 27/74/1287  . Hyperlipidemia associated with type 2 diabetes mellitus ([place]) 08/22/2010  . OBESITY, CLASS III 08/22/2010  . Depression 08/22/2010  . Essential hypertension 08/22/2010  . GERD (gastroesophageal reflux disease) 08/22/2010    Patient's Medications  New Prescriptions   No medications on file  Previous Medications   ACCU-CHEK FASTCLIX LANCETS MISC    Use twice a day to check blood sugars Dx. E11.9   AMMONIUM LACTATE (AMLACTIN) 12 % LOTION    Apply 1 application topically as needed for dry skin.   ATORVASTATIN (LIPITOR) 80 MG TABLET    TAKE 1 TABLET BY MOUTH ONCE A DAY   BLOOD GLUCOSE MONITORING SUPPL (ACCU-CHEK AVIVA PLUS) W/DEVICE KIT    Use to check blood sugars twice a day Dx E11.9   CEPHALEXIN (KEFLEX) 500 MG CAPSULE    Take 1 capsule (500 mg total) by mouth 4 (four) times daily for 10 days.   DOXYCYCLINE (VIBRA-TABS) 100 MG TABLET    Take 1 tablet (100 mg total) by mouth 2 (two) times daily.   FREESTYLE LITE TEST STRIP    USE TO TEST CHECK BLOOD SUGAR TWICE DAILY   FREESTYLE UNISTICK II LANCETS MISC    Use to check blood sugars twice a day   GABAPENTIN (NEURONTIN) 300 MG CAPSULE    Take 1 capsule (300 mg total) by mouth 4 (four) times daily.   INSULIN GLARGINE (BASAGLAR KWIKPEN) 100 UNIT/ML    Inject 0.5 mLs (50 Units total) into the skin 2 (two) times daily.   JARDIANCE 25 MG TABS TABLET    TAKE 1 TABLET BY MOUTH ONCE A DAY    LISINOPRIL-HYDROCHLOROTHIAZIDE (ZESTORETIC) 20-25 MG TABLET    TAKE 1 TABLET BY MOUTH ONCE DAILY   METFORMIN (GLUCOPHAGE) 1000 MG TABLET    TAKE 1 TABLET BY MOUTH TWICE DAILY   SERTRALINE (ZOLOFT) 100 MG TABLET    TAKE 1 TABLET BY MOUTH ONCE DAILY   UNIFINE PENTIPS 32G X 4 MM MISC    USE AS DIRECTED TWICE DAILY  Modified Medications   No medications on file  Discontinued Medications   No medications on file    HPI: Donna [name] is a 54 y.o. 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 Given 5 days doxy with improvement of sx but it returns after stopping abx 06/02/20 virtual visit given 10 more days doxy. On f/u visit had improvement but reported swelling of the involved extremity. dvt negative on duplex Saw pcp clinic on 11/04 where she reports cellulitis never completely resolved with doxy and slowly coming back. It appears she was switched to cephalexin 500 qid today with referal to ID clinic  She doesn't have sorethroat She doesn't have any blister/black spot  No sick contact Intermittent fever still previously on doxy No hx gi illness like ibs, chronic diarrhea, psoriasis  Does have  diabetes mellitus  No focal pain, other skin rash, or joint pain  No n/v/diarrhea   Review of Systems: ROS Negative 11 point ros unless mentioned above   Past Medical History:  Diagnosis Date  . Diabetes mellitus    type 2- uncontrolled  . Hyperlipidemia   . Hypertension   . Lumbar disc disease   . Obesity     Social History   Tobacco Use  . Smoking status: Never Smoker  . Smokeless tobacco: Never Used  Substance Use Topics  . Alcohol use: No  . Drug use: No    Family History  Problem Relation Age of Onset  . Other Father        CHF  . Coronary artery disease Father   . Cancer Father        renal cell carcinoma  . Hypertension Father   .  Diabetes Father   . Diabetes Mother   . Coronary artery disease Mother   . Heart attack Mother   . Cancer Paternal Aunt        Breast  . Breast cancer Paternal Aunt 63  . Atrial fibrillation Other   . Peripheral vascular disease Other   . Heart disease Other    No Known Allergies  OBJECTIVE: Vitals:   06/23/20 1548  Weight: 220 lb (99.8 kg)   Body mass index is 35.51 kg/m.   Physical Exam Well appearing, no distress, conversant, pleasant Heent: noromcephalic; per; conj clear; eomi Neck supple cv rrr no mrg Lungs clear abd s/nt Ext no edema Skin: distal rle well demarcated erythematous macule with some papule in center; warm and mildly tender; 3rd toe dorsum is a small area of duskiness/purpura but no warmth/tenderness Neuro cn2-12 intact, strenth/reflex intact Psych alert/oriented  Lab:  Microbiology: No results found for this or any previous visit (from the past 240 hour(s)).  Serology:  Imaging: 10/22 duplex rle u/s no dvt  Assessment/plan: Cellulitis without evidence toxic shock/nec fasc. It appears this is a streptococcal process (nonpurulent cellulitis). No other systemic disease to suggest cellulitis mimic. The lesion on the toe doesn't appear involved with cellulitis or dactylitis but rather a bruise  She is on appropriate abx today with cephalexin. Would do abx at least for 10 days or 3-5 days after skin sx resolved which ever is longer. Will give her another 10 day supple cephalexin (20 days given in all)  Doesn't appear to be group a strep associated nec fasc and again no evidence toxic shock process   -she can send me picture via mychart in a few days to next week -return precaustion given if rapid spreading of rash, bulla/blister, and ongoing fever  -cephalexin as above    Patient Active Problem List   Diagnosis Date Noted  . Pain and swelling of right lower leg 06/15/2020  . Cellulitis 06/09/2020  . Pre-ulcerative calluses 06/09/2020  .  Diabetic neuropathy ([place]) 12/01/2014  . Routine general medical examination at a health care facility 07/18/2013  . Restless leg syndrome 06/02/2012  . DM (diabetes mellitus), type 2, uncontrolled w/neurologic complication ([place]) 32/95/1884  . Hyperlipidemia associated with type 2 diabetes mellitus ([place]) 08/22/2010  . OBESITY, CLASS III 08/22/2010  . Depression 08/22/2010  . Essential hypertension 08/22/2010  . GERD (gastroesophageal reflux disease) 08/22/2010     Problem List Items Addressed This Visit      Other   Cellulitis - Primary       I am having Felisia R. Swart maintain her Accu-Chek FastClix Lancets,  Accu-Chek Aviva Plus, FreeStyle Unistick II Lancets, Basaglar KwikPen, lisinopril-hydrochlorothiazide, metFORMIN, Unifine Pentips, Jardiance, atorvastatin, gabapentin, FREESTYLE LITE, doxycycline, sertraline, ammonium lactate, and cephALEXin.   Meds ordered this encounter  Medications  . cephALEXin (KEFLEX) 500 MG capsule    Sig: Take 1 capsule (500 mg total) by mouth 4 (four) times daily for 10 days.    Dispense:  40 capsule    Refill:  0     Follow-up: No follow-ups on file.  Jabier Mutton, [facility] for Infectious Disease [place] -- -- pager   [phone] cell 06/23/2020, 3:50 PM

## 2020-07-05 MED FILL — CEPHALEXIN 500 MG CAPSULE: 500 | 10 days supply | Qty: 40 | Fill #0

## 2020-07-13 MED FILL — BASAGLAR 100 UNIT/ML KWIKPE: 100 | 30 days supply | Qty: 30 | Fill #4

## 2020-07-13 MED FILL — UNIFINE PENTIPS 32GX5/32: 32G X 4 MM | 50 days supply | Qty: 100 | Fill #1

## 2020-07-13 MED FILL — GABAPENTIN 300 MG CAPSULE: 300 | 90 days supply | Qty: 360 | Fill #1

## 2020-07-20 ENCOUNTER — Ambulatory Visit: Payer: 59 | Admitting: Podiatry

## 2020-07-20 ENCOUNTER — Other Ambulatory Visit: Payer: Self-pay

## 2020-07-20 DIAGNOSIS — B070 Plantar wart: Secondary | ICD-10-CM

## 2020-07-20 DIAGNOSIS — L853 Xerosis cutis: Secondary | ICD-10-CM | POA: Diagnosis not present

## 2020-07-21 ENCOUNTER — Encounter: Payer: Self-pay | Admitting: Podiatry

## 2020-07-21 NOTE — Progress Notes (Signed)
Subjective:  Patient ID: Donna [name], female    DOB: 06/18/1966,  MRN: [id number]  Chief Complaint  Patient presents with  . Callouses    4 week f/u pt states her right pre-ulcerative callus on her heel looks the same     54 y.o. female presents with the above complaint.  Patient presents with a follow-up of bilateral heel foot xerosis/plantar verruca.  Patient states that she is a diabetic and she is worried that this may turn into an ulceration.  She has a fissure across it as well.  She would like to discuss treatment options for plantar verruca as previously we thought this may have been dry skin and she was not responding to ammonium lactate.   Review of Systems: Negative except as noted in the HPI. Denies N/V/F/Ch.  Past Medical History:  Diagnosis Date  . Diabetes mellitus    type 2- uncontrolled  . Hyperlipidemia   . Hypertension   . Lumbar disc disease   . Obesity     Current Outpatient Medications:  .  ACCU-CHEK FASTCLIX LANCETS MISC, Use twice a day to check blood sugars Dx. E11.9, Disp: 102 each, Rfl: 3 .  ammonium lactate (AMLACTIN) 12 % lotion, Apply 1 application topically as needed for dry skin., Disp: 400 g, Rfl: 0 .  atorvastatin (LIPITOR) 80 MG tablet, TAKE 1 TABLET BY MOUTH ONCE A DAY, Disp: 90 tablet, Rfl: 3 .  Blood Glucose Monitoring Suppl (ACCU-CHEK AVIVA PLUS) w/Device KIT, Use to check blood sugars twice a day Dx E11.9, Disp: 1 kit, Rfl: 0 .  doxycycline (VIBRA-TABS) 100 MG tablet, Take 1 tablet (100 mg total) by mouth 2 (two) times daily. (Patient not taking: Reported on 06/22/2020), Disp: 20 tablet, Rfl: 0 .  FREESTYLE LITE test strip, USE TO TEST CHECK BLOOD SUGAR TWICE DAILY, Disp: 100 strip, Rfl: 5 .  FreeStyle Unistick II Lancets MISC, Use to check blood sugars twice a day, Disp: 100 each, Rfl: 0 .  gabapentin (NEURONTIN) 300 MG capsule, Take 1 capsule (300 mg total) by mouth 4 (four) times daily., Disp: 360 capsule, Rfl: 1 .  Insulin Glargine  (BASAGLAR KWIKPEN) 100 UNIT/ML, Inject 0.5 mLs (50 Units total) into the skin 2 (two) times daily., Disp: 30 mL, Rfl: 11 .  JARDIANCE 25 MG TABS tablet, TAKE 1 TABLET BY MOUTH ONCE A DAY, Disp: 90 tablet, Rfl: 1 .  lisinopril-hydrochlorothiazide (ZESTORETIC) 20-25 MG tablet, TAKE 1 TABLET BY MOUTH ONCE DAILY, Disp: 90 tablet, Rfl: 1 .  metFORMIN (GLUCOPHAGE) 1000 MG tablet, TAKE 1 TABLET BY MOUTH TWICE DAILY, Disp: 180 tablet, Rfl: 1 .  sertraline (ZOLOFT) 100 MG tablet, TAKE 1 TABLET BY MOUTH ONCE DAILY, Disp: 90 tablet, Rfl: 0 .  UNIFINE PENTIPS 32G X 4 MM MISC, USE AS DIRECTED TWICE DAILY, Disp: 100 each, Rfl: 1  Social History   Tobacco Use  Smoking Status Never Smoker  Smokeless Tobacco Never Used    No Known Allergies Objective:  There were no vitals filed for this visit. There is no height or weight on file to calculate BMI. Constitutional Well developed. Well nourished.  Vascular Dorsalis pedis pulses palpable bilaterally. Posterior tibial pulses palpable bilaterally. Capillary refill normal to all digits.  No cyanosis or clubbing noted. Pedal hair growth normal.  Neurologic Normal speech. Oriented to person, place, and time. Epicritic sensation to light touch grossly present bilaterally.  Dermatologic  hyperkeratotic lesion noted to the right lateral hindfoot/heel.  There is severe xerosis noted to the   lateral aspect of the heel.  There is no subjective complaint of itching associated with it.  There is a deep fissure of the skin noted.  Pain on palpation to this lesion.  There is pinpoint bleeding noted upon debridement.  Orthopedic: Normal joint ROM without pain or crepitus bilaterally. No visible deformities. No bony tenderness.   Radiographs: None Assessment:   1. Xerosis cutis   2. Plantar verruca    Plan:  Patient was evaluated and treated and all questions answered.  Right heel plantar verruca -I explained to the patient the etiology of plantar verruca and  various treatment options were discussed.  During her first visit it appeared more xerotic and I believe that patient may benefit from ammonium lactate however after debriding the dry her skin there was pinpoint bleeding associated with it which led me to believe that this is likely a wart and therefore it was not responding to 0 xerosis treatment.  I believe patient will benefit from laser treatment.  She will be scheduled see Ashley for laser.  No follow-ups on file. 

## 2020-07-22 ENCOUNTER — Other Ambulatory Visit: Payer: Self-pay

## 2020-07-22 ENCOUNTER — Ambulatory Visit (INDEPENDENT_AMBULATORY_CARE_PROVIDER_SITE_OTHER): Payer: 59 | Admitting: *Deleted

## 2020-07-22 DIAGNOSIS — B351 Tinea unguium: Secondary | ICD-10-CM

## 2020-07-22 DIAGNOSIS — B070 Plantar wart: Secondary | ICD-10-CM | POA: Diagnosis not present

## 2020-07-22 NOTE — Patient Instructions (Signed)

## 2020-07-22 NOTE — Progress Notes (Signed)
Patient presents today for laser treatment for plantar warts on the right foot, lateral heel edge. There are one large lesions.  Dr. Allena Katz patient.  All other systems are negative.  Lesions were debrided superficially. Laser therapy was administered to the right foot. The patient tolerated the treatment well. All safety precautions were in place.   Patient has a large fissure as well. She is keeping this area wrapped. I advised she use a urea cream to help breakdown some of that callused skin.  Follow up in 2 weeks for laser # 2.  ~Pic of area taken today~

## 2020-07-29 ENCOUNTER — Other Ambulatory Visit: Payer: Self-pay | Admitting: Internal Medicine

## 2020-07-29 MED FILL — LISINOPRIL-HCTZ 20-25 MG TA: 20-25 | 90 days supply | Qty: 90 | Fill #0

## 2020-07-29 MED FILL — FREESTYLE LITE TEST STRIP: 50 days supply | Qty: 100 | Fill #2

## 2020-08-03 ENCOUNTER — Other Ambulatory Visit: Payer: Self-pay | Admitting: Internal Medicine

## 2020-08-04 ENCOUNTER — Other Ambulatory Visit: Payer: Self-pay | Admitting: Internal Medicine

## 2020-08-04 ENCOUNTER — Other Ambulatory Visit: Payer: Self-pay

## 2020-08-04 ENCOUNTER — Ambulatory Visit (INDEPENDENT_AMBULATORY_CARE_PROVIDER_SITE_OTHER): Payer: 59 | Admitting: *Deleted

## 2020-08-04 DIAGNOSIS — B070 Plantar wart: Secondary | ICD-10-CM

## 2020-08-04 MED FILL — METFORMIN HCL 1000 MG TABS: 1000 | 90 days supply | Qty: 180 | Fill #0

## 2020-08-04 MED FILL — JARDIANCE 25 MG TABLET: 25 | 90 days supply | Qty: 90 | Fill #0

## 2020-08-04 NOTE — Progress Notes (Signed)
Patient presents today for laser treatment for plantar warts on the right foot, lateral heel edge. There are one large lesions.  Dr. Allena Katz patient.  All other systems are negative.  Lesions were debrided superficially. Laser therapy was administered to the right foot. The patient tolerated the treatment well. All safety precautions were in place.   Patient has a large fissure as well. She is keeping this area wrapped. I advised she use a urea cream to help breakdown some of that callused skin.  Follow up in 2 weeks for laser # 3.

## 2020-08-16 ENCOUNTER — Ambulatory Visit: Payer: 59 | Admitting: Internal Medicine

## 2020-08-17 ENCOUNTER — Other Ambulatory Visit: Payer: Self-pay | Admitting: Family

## 2020-08-17 ENCOUNTER — Encounter: Payer: Self-pay | Admitting: Family

## 2020-08-17 ENCOUNTER — Ambulatory Visit: Payer: 59 | Admitting: Family

## 2020-08-17 ENCOUNTER — Other Ambulatory Visit: Payer: Self-pay

## 2020-08-17 ENCOUNTER — Other Ambulatory Visit: Payer: 59

## 2020-08-17 DIAGNOSIS — L03115 Cellulitis of right lower limb: Secondary | ICD-10-CM | POA: Diagnosis not present

## 2020-08-17 MED ORDER — CEPHALEXIN 500 MG PO CAPS: 500.0000 mg | ORAL_CAPSULE | Freq: Three times a day (TID) | ORAL | 0 refills | Status: DC

## 2020-08-17 MED FILL — BASAGLAR 100 UNIT/ML KWIKPE: 100 | 30 days supply | Qty: 30 | Fill #5

## 2020-08-17 MED FILL — CEPHALEXIN 500 MG CAPSULE: 500 | 18 days supply | Qty: 56 | Fill #0

## 2020-08-17 NOTE — Progress Notes (Signed)
Subjective:    Patient ID: Donna [name], female    DOB: [birth year], 54 y.o.   MRN: [id number]  Chief Complaint  Patient presents with  . Cellulitis     HPI:  Donna [name] is a 54 y.o. female with diabetes who was last seen on 11/4 by Dr. Gale [name] with concerns for cellulitis of the right lower extremity that was refractory to doxycycline and was changed to cephalexin. Here today for follow up.  Donna [name] completed her Keflex as prescribed with resolution of her symptoms. On Sunday she began having some right lymph node swelling in her right groin and has noted increasing edema and redness of her right lower extremity. Denies fevers and is experiencing malaise. Has concerns as ever since her Achilles surgery a year ago that her leg has not been the same. She is being treated by podiatry for plantar verruca located on her right calcaneous. Circulation has been previous evaluated with good ABIs.   No Known Allergies    Outpatient Medications Prior to Visit  Medication Sig Dispense Refill  . ACCU-CHEK FASTCLIX LANCETS MISC Use twice a day to check blood sugars Dx. E11.9 102 each 3  . atorvastatin (LIPITOR) 80 MG tablet TAKE 1 TABLET BY MOUTH ONCE A DAY 90 tablet 3  . Blood Glucose Monitoring Suppl (ACCU-CHEK AVIVA PLUS) w/Device KIT Use to check blood sugars twice a day Dx E11.9 1 kit 0  . FREESTYLE LITE test strip USE TO TEST CHECK BLOOD SUGAR TWICE DAILY 100 strip 5  . FreeStyle Unistick II Lancets MISC Use to check blood sugars twice a day 100 each 0  . gabapentin (NEURONTIN) 300 MG capsule Take 1 capsule (300 mg total) by mouth 4 (four) times daily. 360 capsule 1  . Insulin Glargine (BASAGLAR KWIKPEN) 100 UNIT/ML Inject 0.5 mLs (50 Units total) into the skin 2 (two) times daily. 30 mL 11  . JARDIANCE 25 MG TABS tablet TAKE 1 TABLET BY MOUTH ONCE A DAY 90 tablet 1  . lisinopril-hydrochlorothiazide (ZESTORETIC) 20-25 MG tablet TAKE 1 TABLET BY MOUTH ONCE DAILY 90 tablet 1  .  metFORMIN (GLUCOPHAGE) 1000 MG tablet TAKE 1 TABLET BY MOUTH TWICE DAILY 180 tablet 1  . sertraline (ZOLOFT) 100 MG tablet TAKE 1 TABLET BY MOUTH ONCE DAILY 90 tablet 0  . UNIFINE PENTIPS 32G X 4 MM MISC USE AS DIRECTED TWICE DAILY 100 each 1  . ammonium lactate (AMLACTIN) 12 % lotion Apply 1 application topically as needed for dry skin. (Patient not taking: Reported on 08/17/2020) 400 g 0  . doxycycline (VIBRA-TABS) 100 MG tablet Take 1 tablet (100 mg total) by mouth 2 (two) times daily. (Patient not taking: No sig reported) 20 tablet 0   No facility-administered medications prior to visit.     Past Medical History:  Diagnosis Date  . Diabetes mellitus    type 2- uncontrolled  . Hyperlipidemia   . Hypertension   . Lumbar disc disease   . Obesity      Past Surgical History:  Procedure Laterality Date  . CHOLECYSTECTOMY  '96   Lap  . ESI     lumbar spine, spinal injections  . EXCISION HAGLUND'S DEFORMITY WITH ACHILLES TENDON REPAIR Right 07/23/2019   Procedure: Right Achilles Tendon Reconstruction, Excision of Haglund Deformity;  Surgeon: Wylene Simmer, MD;  Location: [facility];  Service: Orthopedics;  Laterality: Right;  . GASTROC RECESSION EXTREMITY Right 07/23/2019   Procedure: Gastroc Recession;  Surgeon: Wylene Simmer, MD;  Location: [facility];  Service: Orthopedics;  Laterality: Right;  . REDUCTION MAMMAPLASTY Bilateral   . reduction mammoplasty bilaterally         Review of Systems  Constitutional: Negative for chills, diaphoresis, fatigue and fever.  Respiratory: Negative for cough, chest tightness, shortness of breath and wheezing.   Cardiovascular: Positive for leg swelling (and redness). Negative for chest pain.  Gastrointestinal: Negative for abdominal pain, diarrhea, nausea and vomiting.      Objective:    BP 126/78   Pulse 96   Temp 98.6 F (37 C) (Oral)   Ht _0  (1.676 m)   Wt 226 lb (102.5 kg)   LMP 09/27/2011   SpO2  97%   BMI 36.48 kg/m  Nursing note and vital signs reviewed.  Physical Exam Constitutional:      General: She is not in acute distress.    Appearance: She is well-developed and well-nourished.  Cardiovascular:     Pulses: Intact distal pulses.  Skin:    General: Skin is warm and dry.     Comments: There is edema and redness of the right lower extremity with tenderness of her right calf.   Neurological:     Mental Status: She is alert and oriented to person, place, and time.  Psychiatric:        Mood and Affect: Mood and affect normal.        Behavior: Behavior normal.        Thought Content: Thought content normal.        Judgment: Judgment normal.      Depression screen [facility] 2/9 08/17/2020 06/23/2020 06/22/2020 11/30/2019 05/26/2019  Decreased Interest 0 0 0 1 0  Down, Depressed, Hopeless 0 0 0 0 0  PHQ - 2 Score 0 0 0 1 0  Altered sleeping - - 0 2 -  Tired, decreased energy - - 0 1 -  Change in appetite - - 0 0 -  Feeling bad or failure about yourself  - - 0 0 -  Trouble concentrating - - 0 0 -  Moving slowly or fidgety/restless - - 0 0 -  Suicidal thoughts - - 0 0 -  PHQ-9 Score - - 0 4 -  Difficult doing work/chores - - Not difficult at all Not difficult at all -       Assessment & Plan:    Patient Active Problem List   Diagnosis Date Noted  . Pain and swelling of right lower leg 06/15/2020  . Cellulitis 06/09/2020  . Pre-ulcerative calluses 06/09/2020  . Diabetic neuropathy ([place]) 12/01/2014  . Routine general medical examination at a health care facility 07/18/2013  . Restless leg syndrome 06/02/2012  . DM (diabetes mellitus), type 2, uncontrolled w/neurologic complication ([place]) 62/10/5595  . Hyperlipidemia associated with type 2 diabetes mellitus ([place]) 08/22/2010  . OBESITY, CLASS III 08/22/2010  . Depression 08/22/2010  . Essential hypertension 08/22/2010  . GERD (gastroesophageal reflux disease) 08/22/2010     Problem List Items Addressed This Visit       Other   Cellulitis    Donna [name] appears to have recurrent cellulitis of the right lower extremity likely related to Streptococcus organism given recent treatment. Unclear as to the cause of the recurrence. Discussed possibility of imaging and abscess/oseomyelitis seems unlikely given her current presentation. Will treat with 10 days of Keflex qid. Encouraged leg elevation and mild compression sock as needed. Will follow up with Dr. Gale [name] in 1 month or sooner if needed.  Relevant Medications   cephALEXin (KEFLEX) 500 MG capsule       I am having Rylin R. Murph start on cephALEXin. I am also having her maintain her Accu-Chek FastClix Lancets, Accu-Chek Aviva Plus, FreeStyle Unistick II Lancets, Basaglar KwikPen, Unifine Pentips, atorvastatin, gabapentin, FREESTYLE LITE, doxycycline, sertraline, ammonium lactate, lisinopril-hydrochlorothiazide, metFORMIN, and Jardiance.   Meds ordered this encounter  Medications  . cephALEXin (KEFLEX) 500 MG capsule    Sig: Take 1 capsule (500 mg total) by mouth 3 (three) times daily.    Dispense:  56 capsule    Refill:  0    Order Specific Question:   Supervising Provider    Answer:   Carlyle Basques [[id number]]     Follow-up: Return in about 1 month (around 09/17/2020), or if symptoms worsen or fail to improve.   Terri Piedra, MSN, FNP-C Nurse Practitioner [facility] for Infectious Disease [place] number: [phone]

## 2020-08-17 NOTE — Patient Instructions (Signed)
Nice to see you.   We will get you started on Keflex (cephalexin).  Recommend keeping your leg elevated.   May wish to consider mild/light compression when able to tolerate.   Plan for follow up in 1 month or sooner with Dr. Renold Don.  Have a Happy New Year!

## 2020-08-17 NOTE — Assessment & Plan Note (Signed)
Donna [name] appears to have recurrent cellulitis of the right lower extremity likely related to Streptococcus organism given recent treatment. Unclear as to the cause of the recurrence. Discussed possibility of imaging and abscess/oseomyelitis seems unlikely given her current presentation. Will treat with 10 days of Keflex qid. Encouraged leg elevation and mild compression sock as needed. Will follow up with Dr. Renold Don in 1 month or sooner if needed.

## 2020-08-22 ENCOUNTER — Ambulatory Visit: Payer: 59 | Admitting: Internal Medicine

## 2020-08-29 ENCOUNTER — Other Ambulatory Visit: Payer: 59

## 2020-09-06 ENCOUNTER — Encounter: Payer: Self-pay | Admitting: Internal Medicine

## 2020-09-06 ENCOUNTER — Other Ambulatory Visit: Payer: Self-pay

## 2020-09-06 ENCOUNTER — Ambulatory Visit: Payer: 59 | Admitting: Internal Medicine

## 2020-09-06 VITALS — BP 130/80 | HR 91 | Temp 98.2°F | Resp 18 | Ht 66.0 in | Wt 225.0 lb

## 2020-09-06 DIAGNOSIS — L97511 Non-pressure chronic ulcer of other part of right foot limited to breakdown of skin: Secondary | ICD-10-CM

## 2020-09-06 LAB — COMPREHENSIVE METABOLIC PANEL
ALT: 21 U/L (ref 0–35)
AST: 17 U/L (ref 0–37)
Albumin: 4.6 g/dL (ref 3.5–5.2)
Alkaline Phosphatase: 75 U/L (ref 39–117)
BUN: 14 mg/dL (ref 6–23)
CO2: 27 mEq/L (ref 19–32)
Calcium: 9.3 mg/dL (ref 8.4–10.5)
Chloride: 98 mEq/L (ref 96–112)
Creatinine, Ser: 0.6 mg/dL (ref 0.40–1.20)
GFR: 101.2 mL/min (ref >60.00)
Glucose, Bld: 100 mg/dL — ABNORMAL HIGH (ref 70–99)
Potassium: 3.3 mEq/L — ABNORMAL LOW (ref 3.5–5.1)
Sodium: 135 mEq/L (ref 135–145)
Total Bilirubin: 0.7 mg/dL (ref 0.2–1.2)
Total Protein: 7.7 g/dL (ref 6.0–8.3)

## 2020-09-06 LAB — CBC
HCT: 42.3 % (ref 36.0–46.0)
Hemoglobin: 14.2 g/dL (ref 12.0–15.0)
MCHC: 33.7 g/dL (ref 30.0–36.0)
MCV: 89.4 fl (ref 78.0–100.0)
Platelets: 313 10*3/uL (ref 150.0–400.0)
RBC: 4.73 Mil/uL (ref 3.87–5.11)
RDW: 13.6 % (ref 11.5–15.5)
WBC: 8.7 10*3/uL (ref 4.0–10.5)

## 2020-09-06 LAB — VITAMIN B12: Vitamin B-12: 326 pg/mL (ref 211–911)

## 2020-09-06 LAB — VITAMIN D 25 HYDROXY (VIT D DEFICIENCY, FRACTURES): VITD: 21.48 ng/mL — ABNORMAL LOW (ref 30.00–100.00)

## 2020-09-06 NOTE — Progress Notes (Signed)
   Subjective:   Patient ID: Donna [name], female    DOB: 11-05-1965, 55 y.o.   MRN: [id number]  HPI The patient is a 55 YO female coming in for concerns about wound on the heel of the right foot. She is a diabetic and currently being treated for cellulitis on the right leg. She is on her 3rd round of antibiotics and being seen at ID. She has had improvement in her diabetes control and most recent HgA1c is 6.8. She has been doing better with taking meds and working on diet. She is having pain in the right heel. She was sent to podiatry and they were going to help it with cream but this did not help. She was then told she had a wart and they did laser therapy on it. Then she was told this would not help and she is wanting some resolution. She wears good shoes all the time. No known injury to the area. Prior right achilles tendon repair slightly more than a year ago. Denies instability at the ankle or in the leg.   Review of Systems  Constitutional: Negative.   HENT: Negative.   Eyes: Negative.   Respiratory: Negative for cough, chest tightness and shortness of breath.   Cardiovascular: Negative for chest pain, palpitations and leg swelling.  Gastrointestinal: Negative for abdominal distention, abdominal pain, constipation, diarrhea, nausea and vomiting.  Musculoskeletal: Negative.   Skin: Positive for wound.  Neurological: Negative.   Psychiatric/Behavioral: Negative.     Objective:  Physical Exam Constitutional:      Appearance: She is well-developed and well-nourished.  HENT:     Head: Normocephalic and atraumatic.  Eyes:     Extraocular Movements: EOM normal.  Cardiovascular:     Rate and Rhythm: Normal rate and regular rhythm.  Pulmonary:     Effort: Pulmonary effort is normal. No respiratory distress.     Breath sounds: Normal breath sounds. No wheezing or rales.  Abdominal:     General: Bowel sounds are normal. There is no distension.     Palpations: Abdomen is soft.      Tenderness: There is no abdominal tenderness. There is no rebound.  Musculoskeletal:        General: No edema.     Cervical back: Normal range of motion.  Skin:    General: Skin is warm and dry.     Findings: Rash present.     Comments: Slight redness right shin which is shrinking and improving per patient from prior, right heel with callusing and fissure which is about 3-4 cm long and into the subcutaneous layer but not into the fat or muscle layer.   Neurological:     Mental Status: She is alert and oriented to person, place, and time.     Coordination: Coordination normal.  Psychiatric:        Mood and Affect: Mood and affect normal.     Vitals:   09/06/20 1521  BP: 130/80  Pulse: 91  Resp: 18  Temp: 98.2 F (36.8 C)  TempSrc: Oral  SpO2: 97%  Weight: 225 lb (102.1 kg)  Height: 5\' 6"  (1.676 m)    This visit occurred during the SARS-CoV-2 public health emergency.  Safety protocols were in place, including screening questions prior to the visit, additional usage of staff PPE, and extensive cleaning of exam room while observing appropriate contact time as indicated for disinfecting solutions.   Assessment & Plan:

## 2020-09-06 NOTE — Patient Instructions (Signed)
We will get you to the wound clinic to heal the foot.   We will check the labs today.

## 2020-09-07 DIAGNOSIS — L97511 Non-pressure chronic ulcer of other part of right foot limited to breakdown of skin: Secondary | ICD-10-CM | POA: Insufficient documentation

## 2020-09-07 NOTE — Assessment & Plan Note (Signed)
Given her diabetes and ongoing cellulitis on the right leg we need to be aggressive with management and referral to wound center done today and she agrees with this. Continue keflex although the wound does not appear infected on exam today. Continue follow up with ID for length of antibiotics course.

## 2020-09-16 ENCOUNTER — Other Ambulatory Visit: Payer: Self-pay | Admitting: Internal Medicine

## 2020-09-16 MED FILL — FREESTYLE LITE TEST STRIP: 50 days supply | Qty: 100 | Fill #3

## 2020-09-16 MED FILL — SERTRALINE HCL 100 MG TABS: 100 | 90 days supply | Qty: 90 | Fill #0

## 2020-09-19 ENCOUNTER — Ambulatory Visit: Payer: 59 | Admitting: Family

## 2020-09-26 MED FILL — BASAGLAR 100 UNIT/ML KWIKPE: 100 | 30 days supply | Qty: 30 | Fill #6

## 2020-09-26 MED FILL — SERTRALINE HCL 100 MG TABS: 100 | 90 days supply | Qty: 90 | Fill #0

## 2020-09-29 ENCOUNTER — Other Ambulatory Visit: Payer: Self-pay

## 2020-09-29 ENCOUNTER — Encounter (HOSPITAL_BASED_OUTPATIENT_CLINIC_OR_DEPARTMENT_OTHER): Payer: 59 | Attending: Internal Medicine | Admitting: Internal Medicine

## 2020-09-29 DIAGNOSIS — E1142 Type 2 diabetes mellitus with diabetic polyneuropathy: Secondary | ICD-10-CM | POA: Diagnosis not present

## 2020-09-29 DIAGNOSIS — E11621 Type 2 diabetes mellitus with foot ulcer: Secondary | ICD-10-CM | POA: Insufficient documentation

## 2020-09-29 DIAGNOSIS — L97411 Non-pressure chronic ulcer of right heel and midfoot limited to breakdown of skin: Secondary | ICD-10-CM | POA: Diagnosis not present

## 2020-09-29 DIAGNOSIS — E1151 Type 2 diabetes mellitus with diabetic peripheral angiopathy without gangrene: Secondary | ICD-10-CM | POA: Insufficient documentation

## 2020-09-29 DIAGNOSIS — L97418 Non-pressure chronic ulcer of right heel and midfoot with other specified severity: Secondary | ICD-10-CM | POA: Insufficient documentation

## 2020-09-29 NOTE — Progress Notes (Signed)
Donna [name], Donna [name] ([id number]) Visit Report for 09/29/2020 Abuse/Suicide Risk Screen Details Patient Name: Date of Service: Donna [name], Donna [name] 09/29/2020 9:00 A M Medical Record Number: [id number] Patient Account Number: [id number] Date of Birth/Sex: Treating RN: May 02, 1966 (55 y.o. Wynelle Link Primary Care Imo Cumbie: Hillard Danker Other Clinician: Referring Lian Tanori: Treating Shanayah Kaffenberger/Extender: Shara Blazing in Treatment: 0 Abuse/Suicide Risk Screen Items Answer ABUSE RISK SCREEN: Has anyone close to you tried to hurt or harm you recentlyo No Do you feel uncomfortable with anyone in your familyo No Has anyone forced you do things that you didnt want to doo No Electronic Signature(s) Signed: 09/29/2020 5:58:12 PM By: Zandra Abts RN, BSN Entered By: Zandra Abts on 09/29/2020 09:36:09 -------------------------------------------------------------------------------- Activities of Daily Living Details Patient Name: Date of Service: Donna [name], [name] 09/29/2020 9:00 A M Medical Record Number: [id number] Patient Account Number: [id number] Date of Birth/Sex: Treating RN: Apr 24, 1966 (55 y.o. Wynelle Link Primary Care Sekou Zuckerman: Hillard Danker Other Clinician: Referring Tanita Palinkas: Treating Tommye Lehenbauer/Extender: Shara Blazing in Treatment: 0 Activities of Daily Living Items Answer Activities of Daily Living (Please select one for each item) Drive Automobile Completely Able T Medications ake Completely Able Use T elephone Completely Able Care for Appearance Completely Able Use T oilet Completely Able Bath / Shower Completely Able Dress Self Completely Able Feed Self Completely Able Walk Completely Able Get In / Out Bed Completely Able Housework Completely Able Prepare Meals Completely Able Handle Money Completely Able Shop for Self Completely Able Electronic Signature(s) Signed: 09/29/2020 5:58:12 PM By:  Zandra Abts RN, BSN Entered By: Zandra Abts on 09/29/2020 09:36:30 -------------------------------------------------------------------------------- Education Screening Details Patient Name: Date of Service: Donna [name] 09/29/2020 9:00 A M Medical Record Number: [id number] Patient Account Number: [id number] Date of Birth/Sex: Treating RN: [birth year] (55 y.o. Wynelle Link Primary Care Luvia Orzechowski: Hillard Danker Other Clinician: Referring Sydna Brodowski: Treating Jen Benedict/Extender: Shara Blazing in Treatment: 0 Primary Learner Assessed: Patient Learning Preferences/Education Level/Primary Language Learning Preference: Explanation, Demonstration, Printed Material Highest Education Level: College or Above Preferred Language: English Cognitive Barrier Language Barrier: No Translator Needed: No Memory Deficit: No Emotional Barrier: No Cultural/Religious Beliefs Affecting Medical Care: No Physical Barrier Impaired Vision: No Impaired Hearing: No Decreased Hand dexterity: No Knowledge/Comprehension Knowledge Level: High Comprehension Level: High Ability to understand written instructions: High Ability to understand verbal instructions: High Motivation Anxiety Level: Calm Cooperation: Cooperative Education Importance: Acknowledges Need Interest in Health Problems: Asks Questions Perception: Coherent Willingness to Engage in Self-Management High Activities: Readiness to Engage in Self-Management High Activities: Electronic Signature(s) Signed: 09/29/2020 5:58:12 PM By: Zandra Abts RN, BSN Entered By: Zandra Abts on 09/29/2020 09:36:49 -------------------------------------------------------------------------------- Fall Risk Assessment Details Patient Name: Date of Service: Donna [name] 09/29/2020 9:00 A M Medical Record Number: [id number] Patient Account Number: [id number] Date of Birth/Sex: Treating RN: 07-May-1966 (54  y.o. Wynelle Link Primary Care Brett Soza: Hillard Danker Other Clinician: Referring Ulysses Alper: Treating Viviane Semidey/Extender: Shara Blazing in Treatment: 0 Fall Risk Assessment Items Have you had 2 or more falls in the last 12 monthso 0 No Have you had any fall that resulted in injury in the last 12 monthso 0 No FALLS RISK SCREEN History of falling - immediate or within 3 months 0 No Secondary diagnosis (Do you have 2 or more medical diagnoseso) 0 No Ambulatory aid None/bed rest/wheelchair/nurse 0 Yes Crutches/cane/walker 0 No Furniture 0 No Intravenous therapy Access/Saline/Heparin Lock 0 No Gait/Transferring Normal/ bed rest/ wheelchair 0 Yes Weak (short steps  with or without shuffle, stooped but able to lift head while walking, may seek 0 No support from furniture) Impaired (short steps with shuffle, may have difficulty arising from chair, head down, impaired 0 No balance) Mental Status Oriented to own ability 0 Yes Electronic Signature(s) Signed: 09/29/2020 5:58:12 PM By: Zandra Abts RN, BSN Entered By: Zandra Abts on 09/29/2020 09:37:03 -------------------------------------------------------------------------------- Foot Assessment Details Patient Name: Date of Service: Donna [name] 09/29/2020 9:00 A M Medical Record Number: [id number] Patient Account Number: [id number] Date of Birth/Sex: Treating RN: May 13, 1966 (55 y.o. Wynelle Link Primary Care Chaske Paskett: Hillard Danker Other Clinician: Referring Markcus Lazenby: Treating Zykeria Laguardia/Extender: Shara Blazing in Treatment: 0 Foot Assessment Items Site Locations + = Sensation present, - = Sensation absent, C = Callus, U = Ulcer R = Redness, W = Warmth, M = Maceration, PU = Pre-ulcerative lesion F = Fissure, S = Swelling, D = Dryness Assessment Right: Left: Other Deformity: No No Prior Foot Ulcer: No No Prior Amputation: No No Charcot Joint:  No No Ambulatory Status: Ambulatory Without Help Gait: Steady Electronic Signature(s) Signed: 09/29/2020 5:58:12 PM By: Zandra Abts RN, BSN Entered By: Zandra Abts on 09/29/2020 09:38:00 -------------------------------------------------------------------------------- Nutrition Risk Screening Details Patient Name: Date of Service: Donna [name] 09/29/2020 9:00 A M Medical Record Number: [id number] Patient Account Number: [id number] Date of Birth/Sex: Treating RN: Jan 24, 1966 (55 y.o. Wynelle Link Primary Care Lyndsie Wallman: Hillard Danker Other Clinician: Referring Tallis Soledad: Treating Frankie Scipio/Extender: Shara Blazing in Treatment: 0 Height (in): 66 Weight (lbs): 225 Body Mass Index (BMI): 36.3 Nutrition Risk Screening Items Score Screening NUTRITION RISK SCREEN: I have an illness or condition that made me change the kind and/or amount of food I eat 2 Yes I eat fewer than two meals per day 0 No I eat few fruits and vegetables, or milk products 0 No I have three or more drinks of beer, liquor or wine almost every day 0 No I have tooth or mouth problems that make it hard for me to eat 0 No I don't always have enough money to buy the food I need 0 No I eat alone most of the time 0 No I take three or more different prescribed or over-the-counter drugs a day 1 Yes Without wanting to, I have lost or gained 10 pounds in the last six months 0 No I am not always physically able to shop, cook and/or feed myself 0 No Nutrition Protocols Good Risk Protocol Moderate Risk Protocol 0 Provide education on nutrition High Risk Proctocol Risk Level: Moderate Risk Score: 3 Electronic Signature(s) Signed: 09/29/2020 5:58:12 PM By: Zandra Abts RN, BSN Entered By: Zandra Abts on 09/29/2020 09:37:14

## 2020-09-29 NOTE — Progress Notes (Signed)
Donna, [name] ([id number]) Visit Report for 09/29/2020 Chief Complaint Document Details Patient Name: Date of Service: Donna [name], Donna [name] 09/29/2020 9:00 A M Medical Record Number: [id number] Patient Account Number: [id number] Date of Birth/Sex: Treating RN: August 01, 1966 (55 y.o. [name] Primary Care Provider: Pricilla Holm Other Clinician: Referring Provider: Treating Provider/Extender: Charlie Pitter in Treatment: 0 Information Obtained from: Patient Chief Complaint 09/29/2020; patient is here for review of a wound on the right lateral heel area Electronic Signature(s) Signed: 09/29/2020 5:13:40 PM By: Linton Ham MD Entered By: Linton Ham on 09/29/2020 11:58:57 -------------------------------------------------------------------------------- Debridement Details Patient Name: Date of Service: Donna [name]. 09/29/2020 9:00 A M Medical Record Number: [id number] Patient Account Number: [id number] Date of Birth/Sex: Treating RN: 1965-09-19 (55 y.o. [name] Primary Care Provider: Pricilla Holm Other Clinician: Referring Provider: Treating Provider/Extender: Charlie Pitter in Treatment: 0 Debridement Performed for Assessment: Wound #1 Right,Lateral Calcaneus Performed By: Physician Ricard Dillon., MD Debridement Type: Debridement Severity of Tissue Pre Debridement: Limited to breakdown of skin Level of Consciousness (Pre-procedure): Awake and Alert Pre-procedure Verification/Time Out Yes - 10:15 Taken: Start Time: 10:16 Pain Control: Lidocaine 4% T opical Solution T Area Debrided (L x W): otal 5 (cm) x 5 (cm) = 25 (cm) Tissue and other material debrided: Non-Viable, Callus Level: Non-Viable Tissue Debridement Description: Selective/Open Wound Instrument: Blade Bleeding: Moderate Hemostasis Achieved: Silver Nitrate End Time: 10:20 Procedural Pain: 0 Post Procedural Pain:  0 Response to Treatment: Procedure was tolerated well Level of Consciousness (Post- Awake and Alert procedure): Post Debridement Measurements of Total Wound Length: (cm) 2 Width: (cm) 5 Depth: (cm) 0.1 Volume: (cm) 0.785 Character of Wound/Ulcer Post Debridement: Improved Severity of Tissue Post Debridement: Limited to breakdown of skin Post Procedure Diagnosis Same as Pre-procedure Electronic Signature(s) Signed: 09/29/2020 5:13:40 PM By: Linton Ham MD Signed: 09/29/2020 6:08:01 PM By: Deon Pilling Entered By: Linton Ham on 09/29/2020 11:21:17 -------------------------------------------------------------------------------- HPI Details Patient Name: Date of Service: Donna [name]. 09/29/2020 9:00 A M Medical Record Number: [id number] Patient Account Number: [id number] Date of Birth/Sex: Treating RN: October 03, 1965 (55 y.o. 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 She tells me that since at least last summer she has had a progressive callused area that has opened closed but is never really healed. She has a history of calluses on her feet. She is a diabetic with neuropathy. She was referred to Dr. Posey Pronto at [facility] who felt that this might be a large plantar wart she was treated with laser for 2 treatments and then subsequently with ammonium lactate none of this is apparently made any difference according to the patient. She has a very odd looking area with thick skin and callus and an almost crossed like area of open tissue. She also has erythema above this which she says is chronic. The other interesting part of her recent history is recurrent cellulitis on the right lower  leg although this is separated from the area we are looking at. She has had trips to see infectious disease has been on Keflex for as long as 20 days. This is presumed to be strep. She saw Dr. Gale Journey Past medical history includes type 2 diabetes, recurrent cellulitis of the right leg, right Achilles tendon repair  a year ago for spontaneous rupture ABI in our clinic was 1.09 on the right Electronic Signature(s) Signed: 09/29/2020 5:13:40 PM By: Linton Ham MD Entered By: Linton Ham on 09/29/2020 12:01:30 -------------------------------------------------------------------------------- Physical Exam Details Patient Name: Date of Service: Donna [name] 09/29/2020 9:00 A M Medical Record Number: [id number] Patient Account Number: [id number] Date of Birth/Sex: Treating RN: Apr 14, 1966 (55 y.o. [name] Primary Care Provider: Pricilla Holm Other Clinician: Referring Provider: Treating Provider/Extender: Charlie Pitter in Treatment: 0 Cardiovascular Pedal pulses palpable for the dorsalis pedis and posterior tibial on the right. Integumentary (Hair, Skin) Thick callus-like skin over a large area of the left lateral heel. Above this towards the malleolus is erythema but no tenderness and really no warmth.. Neurological She has some sensation loss to the monofilament but completely absent vibration sense in this foot. Its better on the left side. Notes Wound exam; area questions on the left lateral heel very odd looking area thick raised callused skin with a cross of open tissue 1 going laterally and one going medially. I used pickups and a #10 blade to remove the callus there is this is not deep and the tissue underneath bleeds very easily. I then used a #5 curette to remove the edges around the wound areas. The disc the tissue underneath was nondescript and bled freely Electronic Signature(s) Signed: 09/29/2020 5:13:40 PM By: Linton Ham  MD Entered By: Linton Ham on 09/29/2020 12:03:39 -------------------------------------------------------------------------------- Physician Orders Details Patient Name: Date of Service: Donna [name]. 09/29/2020 9:00 A M Medical Record Number: [id number] Patient Account Number: [id number] Date of Birth/Sex: Treating RN: Oct 31, 1965 (55 y.o. [name] Primary Care Provider: Pricilla Holm Other Clinician: Referring Provider: Treating Provider/Extender: Charlie Pitter in Treatment: 0 Verbal / Phone Orders: No Diagnosis Coding Follow-up Appointments Return Appointment in 1 week. Bathing/ Shower/ Hygiene May shower with protection but do not get wound dressing(s) wet. - when not changing dressings. May shower and wash wound with soap and water. - with dressing changes only. Edema Control - Lymphedema / SCD / Other Avoid standing for long periods of time. Off-Loading Wedge shoe to: - offloading heel sandal. use while walking and standing. Other: - No pressure at all to right heel. Pillow under right leg to offload pressure to right heel while lying and standing. Wound Treatment Wound #1 - Calcaneus Wound Laterality: Right, Lateral Cleanser: Soap and Water Every Other Day/30 Days Discharge Instructions: May shower and wash wound with dial antibacterial soap and water prior to dressing change. Cleanser: Byram Ancillary Kit - 15 Day Supply (DME) (Generic) Every Other Day/30 Days Discharge Instructions: Use supplies as instructed; Kit contains: (15) Saline Bullets; (15) 3x3 Gauze; 15 pr Gloves Peri-Wound Care: Sween Lotion (Moisturizing lotion) Every Other Day/30 Days Discharge Instructions: Apply moisturizing lotion as directed to foot and leg. Prim Dressing: Hydrofera Blue Classic Foam, 2x2 in (DME) (Generic) Every Other Day/30 Days ary Discharge Instructions: Moisten with saline prior to applying to wound bed Secondary Dressing: ABD Pad, 5x9  (DME) Every Other Day/30 Days Discharge Instructions: Apply over primary dressing as directed. Secured With: [employer], 4.5x3.1 (in/yd) (DME) (Generic) Every Other Day/30 Days Discharge Instructions: Secure with Kerlix as directed. Secured With: 38M Medipore H Soft Cloth Surgical T 4 x 2 (in/yd) (DME) (Generic) Every Other Day/30 Days ape Discharge Instructions: Secure dressing with tape as directed. Electronic Signature(s) Signed: 09/29/2020 5:13:40 PM By: Linton Ham MD Signed: 09/29/2020 6:08:01 PM By: Deon Pilling Entered By:  Deon Pilling on 09/29/2020 10:35:05 -------------------------------------------------------------------------------- Problem List Details Patient Name: Date of Service: ETTAMAE, BARKETT 09/29/2020 9:00 A M Medical Record Number: [id number] Patient Account Number: [id number] Date of Birth/Sex: Treating RN: Dec 04, 1965 (55 y.o. [name], [name] Primary Care Provider: Pricilla Holm Other Clinician: Referring Provider: Treating Provider/Extender: Charlie Pitter in Treatment: 0 Active Problems ICD-10 Encounter Code Description Active Date MDM Diagnosis E11.621 Type 2 diabetes mellitus with foot ulcer 09/29/2020 No Yes L97.418 Non-pressure chronic ulcer of right heel and midfoot with other specified 09/29/2020 No Yes severity E11.42 Type 2 diabetes mellitus with diabetic polyneuropathy 09/29/2020 No Yes Inactive Problems Resolved Problems Electronic Signature(s) Signed: 09/29/2020 5:13:40 PM By: Linton Ham MD Entered By: Linton Ham on 09/29/2020 11:19:07 -------------------------------------------------------------------------------- Progress Note Details Patient Name: Date of Service: Donna [name] 09/29/2020 9:00 A M Medical Record Number: [id number] Patient Account Number: [id number] Date of Birth/Sex: Treating RN: 1966/05/10 (55 y.o. 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 She tells me that since at least last summer she has had a progressive callused area that has opened closed but is never really healed. She has a history of calluses on her feet. She is a diabetic with neuropathy. She was referred to Dr. Posey Pronto at [facility] who felt that this might be a large plantar wart she was treated with laser for 2 treatments and then subsequently with ammonium lactate none of this is apparently made any difference according to the patient. She has a very odd looking area with thick skin and callus and an almost crossed like area of open tissue. She also has erythema above this which she says is chronic. The other interesting part of her recent history is recurrent cellulitis on the right lower leg although this is separated from the area we are looking at. She has had trips to see infectious disease has been on Keflex for as long as 20 days. This is presumed to be strep. She saw Dr. Gale Journey Past medical history includes type 2 diabetes, recurrent cellulitis of the right leg, right Achilles tendon repair a year ago for spontaneous rupture ABI in our clinic was 1.09 on the right Patient History Information obtained from Patient. Allergies No Known Drug Allergies Social History Never smoker, Marital Status - Single, Alcohol Use - Never, Drug Use - No History, Caffeine Use - Daily. Medical History Cardiovascular Patient has history of Hypertension Endocrine Patient has history of Type II Diabetes Neurologic Patient has history of Neuropathy Patient is treated with  Insulin, Oral Agents. Blood sugar is not tested. Medical A Surgical History Notes nd Cardiovascular Hyperlipidemia Review of Systems (ROS) Constitutional Symptoms (General Health) Denies complaints or symptoms of Fatigue, Fever, Chills, Marked Weight Change. Eyes Denies complaints or symptoms of Dry Eyes, Vision Changes, Glasses / Contacts. Ear/Nose/Mouth/Throat Denies complaints or symptoms of Chronic sinus problems or rhinitis. Respiratory Denies complaints or symptoms of Chronic or frequent coughs, Shortness of Breath. Cardiovascular Denies complaints or symptoms of Chest pain. Gastrointestinal Denies complaints or symptoms of Frequent diarrhea, Nausea, Vomiting. Endocrine Denies complaints or symptoms of Heat/cold intolerance. Genitourinary Denies complaints or symptoms of Frequent urination. Integumentary (Skin)  Complains or has symptoms of Wounds - wound on right heel. Musculoskeletal Denies complaints or symptoms of Muscle Pain, Muscle Weakness. Psychiatric Denies complaints or symptoms of Claustrophobia, Suicidal. Objective Constitutional Vitals Time Taken: 9:32 AM, Height: 66 in, Source: Stated, Weight: 225 lbs, Source: Stated, BMI: 36.3, Temperature: 97.9 F, Pulse: 78 bpm, Respiratory Rate: 18 breaths/min, Blood Pressure: 137/85 mmHg. Cardiovascular Pedal pulses palpable for the dorsalis pedis and posterior tibial on the right. Neurological She has some sensation loss to the monofilament but completely absent vibration sense in this foot. Its better on the left side. General Notes: Wound exam; area questions on the left lateral heel very odd looking area thick raised callused skin with a cross of open tissue 1 going laterally and one going medially. I used pickups and a #10 blade to remove the callus there is this is not deep and the tissue underneath bleeds very easily. I then used a #5 curette to remove the edges around the wound areas. The disc the tissue  underneath was nondescript and bled freely Integumentary (Hair, Skin) Thick callus-like skin over a large area of the left lateral heel. Above this towards the malleolus is erythema but no tenderness and really no warmth.. Wound #1 status is Open. Original cause of wound was Gradually Appeared. The wound is located on the Right,Lateral Calcaneus. The wound measures 2.9cm length x 5.5cm width x 0.6cm depth; 12.527cm^2 area and 7.516cm^3 volume. There is Fat Layer (Subcutaneous Tissue) exposed. There is no tunneling or undermining noted. There is a medium amount of serosanguineous drainage noted. The wound margin is thickened. There is large (67-100%) pink, pale granulation within the wound bed. There is a small (1-33%) amount of necrotic tissue within the wound bed including Adherent Slough. Assessment Active Problems ICD-10 Type 2 diabetes mellitus with foot ulcer Non-pressure chronic ulcer of right heel and midfoot with other specified severity Type 2 diabetes mellitus with diabetic polyneuropathy Procedures Wound #1 Pre-procedure diagnosis of Wound #1 is a Diabetic Wound/Ulcer of the Lower Extremity located on the Right,Lateral Calcaneus .Severity of Tissue Pre Debridement is: Limited to breakdown of skin. There was a Selective/Open Wound Non-Viable Tissue Debridement with a total area of 25 sq cm performed by Ricard Dillon., MD. With the following instrument(s): Blade to remove Non-Viable tissue/material. Material removed includes Callus after achieving pain control using Lidocaine 4% T opical Solution. A time out was conducted at 10:15, prior to the start of the procedure. A Moderate amount of bleeding was controlled with Silver Nitrate. The procedure was tolerated well with a pain level of 0 throughout and a pain level of 0 following the procedure. Post Debridement Measurements: 2cm length x 5cm width x 0.1cm depth; 0.785cm^3 volume. Character of Wound/Ulcer Post Debridement is  improved. Severity of Tissue Post Debridement is: Limited to breakdown of skin. Post procedure Diagnosis Wound #1: Same as Pre-Procedure Plan Follow-up Appointments: Return Appointment in 1 week. Bathing/ Shower/ Hygiene: May shower with protection but do not get wound dressing(s) wet. - when not changing dressings. May shower and wash wound with soap and water. - with dressing changes only. Edema Control - Lymphedema / SCD / Other: Avoid standing for long periods of time. Off-Loading: Wedge shoe to: - offloading heel sandal. use while walking and standing. Other: - No pressure at all to right heel. Pillow under right leg to offload pressure to right heel while lying and standing. WOUND #1: - Calcaneus Wound Laterality: Right, Lateral Cleanser: Soap and Water Every Other Day/30 Days  Discharge Instructions: May shower and wash wound with dial antibacterial soap and water prior to dressing change. Cleanser: [employer] Ancillary Kit - 15 Day Supply (DME) (Generic) Every Other Day/30 Days Discharge Instructions: Use supplies as instructed; Kit contains: (15) Saline Bullets; (15) 3x3 Gauze; 15 pr Gloves Peri-Wound Care: Sween Lotion (Moisturizing lotion) Every Other Day/30 Days Discharge Instructions: Apply moisturizing lotion as directed to foot and leg. Prim Dressing: Hydrofera Blue Classic Foam, 2x2 in (DME) (Generic) Every Other Day/30 Days ary Discharge Instructions: Moisten with saline prior to applying to wound bed Secondary Dressing: ABD Pad, 5x9 (DME) Every Other Day/30 Days Discharge Instructions: Apply over primary dressing as directed. Secured With: [employer], 4.5x3.1 (in/yd) (DME) (Generic) Every Other Day/30 Days Discharge Instructions: Secure with Kerlix as directed. Secured With: 71M Medipore H Soft Cloth Surgical T 4 x 2 (in/yd) (DME) (Generic) Every Other Day/30 Days ape Discharge Instructions: Secure dressing with tape as directed. 1. I am really not sure what this  represents. I have never seen a plantar wart look specifically like this even larger ones. 2. I think the easiest explanation is recurrent friction and pressure in a diabetic with neuropathy. What seem to support this was looking at the sole of her shoe in which the indent laterally was much more impressive than medially. 3. After debridement I elected to dress this wound with Hydrofera Blue. We are going to put her in an offloading healing sandal I talked about offloading this area religiously. This would be at work and at home. 4. If this reoccurs quickly I think this patient will likely need a biopsy of the underlying skin. Some malignancies can cause hyperkeratotic thick tissue which bleeds like this after debridement. I am also concerned about the erythema above this although it certainly did not look like cellulitis and looking at some of the pictures on her phone this always seems to have been there. I spent 35 minutes in review of this patient's past medical history, face-to-face evaluation and preparation of this Electronic Signature(s) Signed: 09/29/2020 5:13:40 PM By: Linton Ham MD Entered By: Linton Ham on 09/29/2020 12:05:53 -------------------------------------------------------------------------------- HxROS Details Patient Name: Date of Service: Donna [name]. 09/29/2020 9:00 A M Medical Record Number: [id number] Patient Account Number: [id number] Date of Birth/Sex: Treating RN: 1966-03-18 (55 y.o. Nancy Fetter Primary Care Provider: Pricilla Holm Other Clinician: Referring Provider: Treating Provider/Extender: Charlie Pitter in Treatment: 0 Information Obtained From Patient Constitutional Symptoms (General Health) Complaints and Symptoms: Negative for: Fatigue; Fever; Chills; Marked Weight Change Eyes Complaints and Symptoms: Negative for: Dry Eyes; Vision Changes; Glasses / Contacts Ear/Nose/Mouth/Throat Complaints  and Symptoms: Negative for: Chronic sinus problems or rhinitis Respiratory Complaints and Symptoms: Negative for: Chronic or frequent coughs; Shortness of Breath Cardiovascular Complaints and Symptoms: Negative for: Chest pain Medical History: Positive for: Hypertension Past Medical History Notes: Hyperlipidemia Gastrointestinal Complaints and Symptoms: Negative for: Frequent diarrhea; Nausea; Vomiting Endocrine Complaints and Symptoms: Negative for: Heat/cold intolerance Medical History: Positive for: Type II Diabetes Treated with: Insulin, Oral agents Blood sugar tested every day: No Genitourinary Complaints and Symptoms: Negative for: Frequent urination Integumentary (Skin) Complaints and Symptoms: Positive for: Wounds - wound on right heel Musculoskeletal Complaints and Symptoms: Negative for: Muscle Pain; Muscle Weakness Psychiatric Complaints and Symptoms: Negative for: Claustrophobia; Suicidal Hematologic/Lymphatic Immunological Neurologic Medical History: Positive for: Neuropathy Oncologic Immunizations Pneumococcal Vaccine: Received Pneumococcal Vaccination: No Implantable Devices None Family and Social History Never smoker; Marital Status - Single; Alcohol Use: Never; Drug Use:  No History; Caffeine Use: Daily; Financial Concerns: No; Food, Clothing or Shelter Needs: No; Support System Lacking: No; Transportation Concerns: No Engineer, maintenance) Signed: 09/29/2020 5:13:40 PM By: Linton Ham MD Signed: 09/29/2020 5:58:12 PM By: Levan Hurst RN, BSN Entered By: Levan Hurst on 09/29/2020 09:36:02 -------------------------------------------------------------------------------- SuperBill Details Patient Name: Date of Service: Donna [name] 09/29/2020 Medical Record Number: [id number] Patient Account Number: [id number] Date of Birth/Sex: Treating RN: 05-27-1966 (55 y.o. [name], [name] Primary Care Provider: Pricilla Holm Other  Clinician: Referring Provider: Treating Provider/Extender: Charlie Pitter in Treatment: 0 Diagnosis Coding ICD-10 Codes Code Description 7540071777 Type 2 diabetes mellitus with foot ulcer L97.418 Non-pressure chronic ulcer of right heel and midfoot with other specified severity E11.42 Type 2 diabetes mellitus with diabetic polyneuropathy Facility Procedures CPT4 Code: [id number] Description: 91225 - WOUND CARE VISIT-LEV 3 EST PT Modifier: Quantity: 1 CPT4 Code: [id number] Description: 71252 - DEBRIDE WOUND 1ST 20 SQ CM OR < ICD-10 Diagnosis Description L97.418 Non-pressure chronic ulcer of right heel and midfoot with other specified severi Modifier: ty Quantity: 1 Physician Procedures : CPT4 Code Description Modifier [id number] WC PHYS LEVEL 3 NEW PT 25 ICD-10 Diagnosis Description E11.621 Type 2 diabetes mellitus with foot ulcer L97.418 Non-pressure chronic ulcer of right heel and midfoot with other specified severity E11.42 Type 2  diabetes mellitus with diabetic polyneuropathy Quantity: 1 : [id number] 96924 - WC PHYS DEBR WO ANESTH 20 SQ CM ICD-10 Diagnosis Description L97.418 Non-pressure chronic ulcer of right heel and midfoot with other specified severity Quantity: 1 : [id number] - WC PHYS DEBR WO ANESTH EA ADD 20 CM ICD-10 Diagnosis Description L97.418 Non-pressure chronic ulcer of right heel and midfoot with other specified severity Quantity: 1 Electronic Signature(s) Signed: 09/29/2020 5:13:40 PM By: Linton Ham MD Entered By: Linton Ham on 09/29/2020 12:06:22

## 2020-09-29 NOTE — Progress Notes (Signed)
KIORA, HALLBERG ([id number]) Visit Report for 09/29/2020 Allergy List Details Patient Name: Date of Service: Donna [name], Donna [name] 09/29/2020 9:00 A M Medical Record Number: [id number] Patient Account Number: [id number] Date of Birth/Sex: Treating RN: 08/11/1966 (55 y.o. Nancy Fetter Primary Care Provider: Pricilla Holm Other Clinician: Referring Provider: Treating Provider/Extender: Charlie Pitter in Treatment: 0 Allergies Active Allergies No Known Drug Allergies Type: Allergen Allergy Notes Electronic Signature(s) Signed: 09/29/2020 5:58:12 PM By: Levan Hurst RN, BSN Entered By: Levan Hurst on 09/29/2020 09:33:30 -------------------------------------------------------------------------------- Arrival Information Details Patient Name: Date of Service: Donna Hamming. 09/29/2020 9:00 A M Medical Record Number: [id number] Patient Account Number: [id number] Date of Birth/Sex: Treating RN: Apr 20, 1966 (55 y.o. Nancy Fetter Primary Care Provider: Pricilla Holm Other Clinician: Referring Provider: Treating Provider/Extender: Charlie Pitter in Treatment: 0 Visit Information Patient Arrived: Ambulatory Arrival Time: 09:19 Accompanied By: alone Transfer Assistance: None Patient Identification Verified: Yes Secondary Verification Process Completed: Yes Patient Requires Transmission-Based Precautions: No Patient Has Alerts: No Electronic Signature(s) Signed: 09/29/2020 5:58:12 PM By: Levan Hurst RN, BSN Entered By: Levan Hurst on 09/29/2020 09:32:21 -------------------------------------------------------------------------------- Clinic Level of Care Assessment Details Patient Name: Date of Service: Donna [name], Donna [name] 09/29/2020 9:00 A M Medical Record Number: [id number] Patient Account Number: [id number] Date of Birth/Sex: Treating RN: 06/08/1966 (55 y.o. [name] Primary Care Provider:  Pricilla Holm Other Clinician: Referring Provider: Treating Provider/Extender: Charlie Pitter in Treatment: 0 Clinic Level of Care Assessment Items TOOL 1 Quantity Score X- 1 0 Use when EandM and Procedure is performed on INITIAL visit ASSESSMENTS - Nursing Assessment / Reassessment X- 1 20 General Physical Exam (combine w/ comprehensive assessment (listed just below) when performed on new pt. evals) X- 1 25 Comprehensive Assessment (HX, ROS, Risk Assessments, Wounds Hx, etc.) ASSESSMENTS - Wound and Skin Assessment / Reassessment X- 1 10 Dermatologic / Skin Assessment (not related to wound area) ASSESSMENTS - Ostomy and/or Continence Assessment and Care [] - 0 Incontinence Assessment and Management [] - 0 Ostomy Care Assessment and Management (repouching, etc.) PROCESS - Coordination of Care X - Simple Patient / Family Education for ongoing care 1 15 [] - 0 Complex (extensive) Patient / Family Education for ongoing care X- 1 10 Staff obtains Programmer, systems, Records, T Results / Process Orders est [] - 0 Staff telephones HHA, Nursing Homes / Clarify orders / etc [] - 0 Routine Transfer to another Facility (non-emergent condition) [] - 0 Routine Hospital Admission (non-emergent condition) X- 1 15 New Admissions / Biomedical engineer / Ordering NPWT Apligraf, etc. , [] - 0 Emergency Hospital Admission (emergent condition) PROCESS - Special Needs [] - 0 Pediatric / Minor Patient Management [] - 0 Isolation Patient Management [] - 0 Hearing / Language / Visual special needs [] - 0 Assessment of Community assistance (transportation, D/C planning, etc.) [] - 0 Additional assistance / Altered mentation [] - 0 Support Surface(s) Assessment (bed, cushion, seat, etc.) INTERVENTIONS - Miscellaneous [] - 0 External ear exam [] - 0 Patient Transfer (multiple staff / Civil Service fast streamer / Similar devices) [] - 0 Simple Staple / Suture removal (25 or  less) [] - 0 Complex Staple / Suture removal (26 or more) [] - 0 Hypo/Hyperglycemic Management (do not check if billed separately) X- 1 15 Ankle / Brachial Index (ABI) - do not check if billed separately Has the patient been seen at the hospital within the last three years: Yes Total Score: 110 Level Of Care:  New/Established - Level 3 Electronic Signature(s) Signed: 09/29/2020 6:08:01 PM By: Deon Pilling Entered By: Deon Pilling on 09/29/2020 10:55:57 -------------------------------------------------------------------------------- Lower Extremity Assessment Details Patient Name: Date of Service: Donna [name], Donna [name] 09/29/2020 9:00 A M Medical Record Number: [id number] Patient Account Number: [id number] Date of Birth/Sex: Treating RN: 11/13/1965 (55 y.o. Nancy Fetter Primary Care Provider: Pricilla Holm Other Clinician: Referring Provider: Treating Provider/Extender: Charlie Pitter in Treatment: 0 Edema Assessment Assessed: Shirlyn Goltz: No] [Right: No] E[Left: dema] [Right: :] Calf Left: Right: Point of Measurement: 32 cm From Medial Instep 37 cm Ankle Left: Right: Point of Measurement: 12 cm From Medial Instep 23 cm Knee To Floor Left: Right: From Medial Instep 41 cm Vascular Assessment Pulses: Dorsalis Pedis Palpable: [Right:Yes] Blood Pressure: Brachial: [Right:137] Ankle: [Right:Dorsalis Pedis: 150 1.09] Electronic Signature(s) Signed: 09/29/2020 5:58:12 PM By: Levan Hurst RN, BSN Entered By: Levan Hurst on 09/29/2020 09:41:46 -------------------------------------------------------------------------------- Multi Wound Chart Details Patient Name: Date of Service: Donna Hamming. 09/29/2020 9:00 A M Medical Record Number: [id number] Patient Account Number: [id number] Date of Birth/Sex: Treating RN: Dec 26, 1965 (55 y.o. [name], [name] Primary Care Provider: Pricilla Holm Other Clinician: Referring  Provider: Treating Provider/Extender: Charlie Pitter in Treatment: 0 Vital Signs Height(in): 66 Pulse(bpm): 78 Weight(lbs): 225 Blood Pressure(mmHg): 137/85 Body Mass Index(BMI): 36 Temperature(F): 97.9 Respiratory Rate(breaths/min): 18 Photos: [1:No Photos Right, Lateral Calcaneus] [N/A:N/A N/A] Wound Location: [1:Gradually Appeared] [N/A:N/A] Wounding Event: [1:Diabetic Wound/Ulcer of the Lower] [N/A:N/A] Primary Etiology: [1:Extremity Hypertension, Type II Diabetes,] [N/A:N/A] Comorbid History: [1:Neuropathy 05/20/2020] [N/A:N/A] Date Acquired: [1:0] [N/A:N/A] Weeks of Treatment: [1:Open] [N/A:N/A] Wound Status: [1:2.9x5.5x0.6] [N/A:N/A] Measurements L x W x D (cm) [1:12.527] [N/A:N/A] A (cm) : rea [1:7.516] [N/A:N/A] Volume (cm) : [1:Grade 2] [N/A:N/A] Classification: [1:Medium] [N/A:N/A] Exudate A mount: [1:Serosanguineous] [N/A:N/A] Exudate Type: [1:red, brown] [N/A:N/A] Exudate Color: [1:Thickened] [N/A:N/A] Wound Margin: [1:Large (67-100%)] [N/A:N/A] Granulation A mount: [1:Pink, Pale] [N/A:N/A] Granulation Quality: [1:Small (1-33%)] [N/A:N/A] Necrotic A mount: [1:Fat Layer (Subcutaneous Tissue): Yes N/A] Exposed Structures: [1:Fascia: No Tendon: No Muscle: No Joint: No Bone: No None] [N/A:N/A] Epithelialization: [1:Debridement - Selective/Open Wound N/A] Debridement: Pre-procedure Verification/Time Out 10:15 [N/A:N/A] Taken: [1:Lidocaine 4% T opical Solution] [N/A:N/A] Pain Control: [1:Callus] [N/A:N/A] Tissue Debrided: [1:Non-Viable Tissue] [N/A:N/A] Level: [1:25] [N/A:N/A] Debridement A (sq cm): [1:rea Blade] [N/A:N/A] Instrument: [1:Moderate] [N/A:N/A] Bleeding: [1:Silver Nitrate] [N/A:N/A] Hemostasis A chieved: [1:0] [N/A:N/A] Procedural Pain: [1:0] [N/A:N/A] Post Procedural Pain: [1:Procedure was tolerated well] [N/A:N/A] Debridement Treatment Response: [1:2x5x0.1] [N/A:N/A] Post Debridement Measurements L x W x D (cm)  [1:0.785] [N/A:N/A] Post Debridement Volume: (cm) [1:Debridement] [N/A:N/A] Treatment Notes Electronic Signature(s) Signed: 09/29/2020 5:13:40 PM By: Linton Ham MD Signed: 09/29/2020 6:08:01 PM By: Deon Pilling Entered By: Linton Ham on 09/29/2020 11:19:38 -------------------------------------------------------------------------------- Multi-Disciplinary Care Plan Details Patient Name: Date of Service: Donna Hamming. 09/29/2020 9:00 A M Medical Record Number: [id number] Patient Account Number: [id number] Date of Birth/Sex: Treating RN: 18-Aug-1966 (55 y.o. [name] Primary Care Provider: Pricilla Holm Other Clinician: Referring Provider: Treating Provider/Extender: Charlie Pitter in Treatment: 0 Active Inactive Nutrition Nursing Diagnoses: Potential for alteratiion in Nutrition/Potential for imbalanced nutrition Goals: Patient/caregiver agrees to and verbalizes understanding of need to obtain nutritional consultation Date Initiated: 09/29/2020 Target Resolution Date: 11/25/2020 Goal Status: Active Patient/caregiver will maintain therapeutic glucose control Date Initiated: 09/29/2020 Target Resolution Date: 11/25/2020 Goal Status: Active Interventions: Provide education on elevated blood sugars and impact on wound healing Provide education on nutrition Treatment Activities: Obtain HgA1c : 09/29/2020 Patient referred to Primary  Care Physician for further nutritional evaluation : 09/29/2020 Notes: Orientation to the Wound Care Program Nursing Diagnoses: Knowledge deficit related to the wound healing center program Goals: Patient/caregiver will verbalize understanding of the [place] Date Initiated: 09/29/2020 Target Resolution Date: 10/27/2020 Goal Status: Active Interventions: Provide education on orientation to the wound center Notes: Pain, Acute or Chronic Nursing Diagnoses: Pain, acute or chronic:  actual or potential Potential alteration in comfort, pain Goals: Patient will verbalize adequate pain control and receive pain control interventions during procedures as needed Date Initiated: 09/29/2020 Target Resolution Date: 10/28/2020 Goal Status: Active Patient/caregiver will verbalize comfort level met Date Initiated: 09/29/2020 Target Resolution Date: 10/28/2020 Goal Status: Active Interventions: Encourage patient to take pain medications as prescribed Provide education on pain management Notes: Wound/Skin Impairment Nursing Diagnoses: Knowledge deficit related to ulceration/compromised skin integrity Goals: Patient/caregiver will verbalize understanding of skin care regimen Date Initiated: 09/29/2020 Target Resolution Date: 10/28/2020 Goal Status: Active Interventions: Assess patient/caregiver ability to obtain necessary supplies Assess patient/caregiver ability to perform ulcer/skin care regimen upon admission and as needed Provide education on ulcer and skin care Treatment Activities: Skin care regimen initiated : 09/29/2020 Topical wound management initiated : 09/29/2020 Notes: Electronic Signature(s) Signed: 09/29/2020 6:08:01 PM By: Deon Pilling Entered By: Deon Pilling on 09/29/2020 09:57:43 -------------------------------------------------------------------------------- Pain Assessment Details Patient Name: Date of Service: REIGNA, RUPERTO 09/29/2020 9:00 A M Medical Record Number: [id number] Patient Account Number: [id number] Date of Birth/Sex: Treating RN: 11-16-1965 (55 y.o. Nancy Fetter Primary Care Provider: Pricilla Holm Other Clinician: Referring Provider: Treating Provider/Extender: Charlie Pitter in Treatment: 0 Active Problems Location of Pain Severity and Description of Pain Patient Has Paino No Site Locations Pain Management and Medication Current Pain Management: Electronic Signature(s) Signed:  09/29/2020 5:58:12 PM By: Levan Hurst RN, BSN Entered By: Levan Hurst on 09/29/2020 09:42:54 -------------------------------------------------------------------------------- Patient/Caregiver Education Details Patient Name: Date of Service: Donna [name], Donna R. 2/10/2022andnbsp9:00 A M Medical Record Number: [id number] Patient Account Number: [id number] Date of Birth/Gender: Treating RN: 08-04-1966 (55 y.o. [name] Primary Care Physician: Pricilla Holm Other Clinician: Referring Physician: Treating Physician/Extender: Charlie Pitter in Treatment: 0 Education Assessment Education Provided To: Patient Education Topics Provided Elevated Blood Sugar/ Impact on Healing: Handouts: Elevated Blood Sugars: How Do They Affect Wound Healing Methods: Explain/Verbal, Printed Responses: Reinforcements needed [place]: o Handouts: Welcome T [facility] [place] o Methods: Explain/Verbal, Printed Responses: Reinforcements needed Wound/Skin Impairment: Handouts: Caring for Your Ulcer, Skin Care Do's and Dont's Methods: Explain/Verbal, Printed Responses: Reinforcements needed Electronic Signature(s) Signed: 09/29/2020 6:08:01 PM By: Deon Pilling Entered By: Deon Pilling on 09/29/2020 09:58:13 -------------------------------------------------------------------------------- Wound Assessment Details Patient Name: Date of Service: Donna [name], Donna [name] 09/29/2020 9:00 A M Medical Record Number: [id number] Patient Account Number: [id number] Date of Birth/Sex: Treating RN: [birth year] (55 y.o. Nancy Fetter Primary Care Provider: Pricilla Holm Other Clinician: Referring Provider: Treating Provider/Extender: Charlie Pitter in Treatment: 0 Wound Status Wound Number: 1 Primary Etiology: Diabetic Wound/Ulcer of the Lower Extremity Wound Location: Right, Lateral Calcaneus Wound Status:  Open Wounding Event: Gradually Appeared Comorbid History: Hypertension, Type II Diabetes, Neuropathy Date Acquired: 05/20/2020 Weeks Of Treatment: 0 Clustered Wound: No Wound Measurements Length: (cm) 2.9 Width: (cm) 5.5 Depth: (cm) 0.6 Area: (cm) 12.527 Volume: (cm) 7.516 % Reduction in Area: % Reduction in Volume: Epithelialization: None Tunneling: No Undermining: No Wound Description Classification: Grade 2 Wound Margin: Thickened Exudate Amount: Medium Exudate Type: Serosanguineous Exudate  Color: red, brown Foul Odor After Cleansing: No Slough/Fibrino Yes Wound Bed Granulation Amount: Large (67-100%) Exposed Structure Granulation Quality: Pink, Pale Fascia Exposed: No Necrotic Amount: Small (1-33%) Fat Layer (Subcutaneous Tissue) Exposed: Yes Necrotic Quality: Adherent Slough Tendon Exposed: No Muscle Exposed: No Joint Exposed: No Bone Exposed: No Electronic Signature(s) Signed: 09/29/2020 5:58:12 PM By: Levan Hurst RN, BSN Entered By: Levan Hurst on 09/29/2020 09:42:46 -------------------------------------------------------------------------------- Vitals Details Patient Name: Date of Service: Donna Hamming. 09/29/2020 9:00 A M Medical Record Number: [id number] Patient Account Number: [id number] Date of Birth/Sex: Treating RN: 07/04/1966 (55 y.o. Nancy Fetter Primary Care [place]: Pricilla Holm Other Clinician: Referring Colandra Ohanian: Treating Romelle Muldoon/Extender: Charlie Pitter in Treatment: 0 Vital Signs Time Taken: 09:32 Temperature (F): 97.9 Height (in): 66 Pulse (bpm): 78 Source: Stated Respiratory Rate (breaths/min): 18 Weight (lbs): 225 Blood Pressure (mmHg): 137/85 Source: Stated Reference Range: 80 - 120 mg / dl Body Mass Index (BMI): 36.3 Electronic Signature(s) Signed: 09/29/2020 5:58:12 PM By: Levan Hurst RN, BSN Entered By: Levan Hurst on 09/29/2020 09:33:44

## 2020-09-30 DIAGNOSIS — S91309A Unspecified open wound, unspecified foot, initial encounter: Secondary | ICD-10-CM | POA: Diagnosis not present

## 2020-10-06 ENCOUNTER — Encounter (HOSPITAL_BASED_OUTPATIENT_CLINIC_OR_DEPARTMENT_OTHER): Payer: 59 | Admitting: Internal Medicine

## 2020-10-06 ENCOUNTER — Other Ambulatory Visit: Payer: Self-pay

## 2020-10-06 DIAGNOSIS — E1142 Type 2 diabetes mellitus with diabetic polyneuropathy: Secondary | ICD-10-CM | POA: Diagnosis not present

## 2020-10-06 DIAGNOSIS — E1151 Type 2 diabetes mellitus with diabetic peripheral angiopathy without gangrene: Secondary | ICD-10-CM | POA: Diagnosis not present

## 2020-10-06 DIAGNOSIS — L97418 Non-pressure chronic ulcer of right heel and midfoot with other specified severity: Secondary | ICD-10-CM | POA: Diagnosis not present

## 2020-10-06 DIAGNOSIS — L97511 Non-pressure chronic ulcer of other part of right foot limited to breakdown of skin: Secondary | ICD-10-CM | POA: Diagnosis not present

## 2020-10-06 DIAGNOSIS — E11621 Type 2 diabetes mellitus with foot ulcer: Secondary | ICD-10-CM | POA: Diagnosis not present

## 2020-10-06 NOTE — Progress Notes (Signed)
OMIE, FERGER ([id number]) Visit Report for 10/06/2020 Debridement Details Patient Name: Date of Service: Donna [name], Donna [name] 10/06/2020 3:15 PM Medical Record Number: [id number] Patient Account Number: [id number] Date of Birth/Sex: Treating RN: April 07, 1966 (55 y.o. [name], [name] Primary Care Provider: Hillard Danker Other Clinician: Referring Provider: Treating Provider/Extender: Shara Blazing in Treatment: 1 Debridement Performed for Assessment: Wound #1 Right,Lateral Calcaneus Performed By: Physician Maxwell Caul., MD Debridement Type: Debridement Severity of Tissue Pre Debridement: Limited to breakdown of skin Level of Consciousness (Pre-procedure): Awake and Alert Pre-procedure Verification/Time Out Yes - 15:48 Taken: Start Time: 15:49 Pain Control: Lidocaine 4% T opical Solution T Area Debrided (L x W): otal 3 (cm) x 3 (cm) = 9 (cm) Tissue and other material debrided: Viable, Non-Viable, Callus, Skin: Epidermis Level: Skin/Epidermis Debridement Description: Selective/Open Wound Instrument: Curette Bleeding: None End Time: 15:52 Procedural Pain: 0 Post Procedural Pain: 0 Response to Treatment: Procedure was tolerated well Level of Consciousness (Post- Awake and Alert procedure): Post Debridement Measurements of Total Wound Length: (cm) 0.6 Width: (cm) 0.6 Depth: (cm) 0.1 Volume: (cm) 0.028 Character of Wound/Ulcer Post Debridement: Improved Severity of Tissue Post Debridement: Limited to breakdown of skin Post Procedure Diagnosis Same as Pre-procedure Electronic Signature(s) Signed: 10/06/2020 5:29:55 PM By: Baltazar Najjar MD Signed: 10/06/2020 5:51:10 PM By: Shawn Stall Entered By: Baltazar Najjar on 10/06/2020 16:05:33 -------------------------------------------------------------------------------- HPI Details Patient Name: Date of Service: Donna [name] 10/06/2020 3:15 PM Medical Record Number:  [id number] Patient Account Number: [id number] Date of Birth/Sex: Treating RN: Mar 12, 1966 (55 y.o. 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 She tells me that since at least last summer she has had a progressive callused area that has opened closed but is never really healed. She has a history of calluses on her feet. She is a diabetic with neuropathy. She was referred to Dr. Allena Katz at [facility] who felt that this might be a large plantar wart she was treated with laser for 2 treatments and then subsequently with ammonium lactate none of this is apparently made any difference according to the patient. She has a very odd looking area with thick skin and callus and an almost crossed like area of open tissue. She also has erythema above this which she says is chronic. The other interesting part of her recent history is recurrent cellulitis on the right lower leg although this is separated from the area we are looking at. She has had trips to see infectious disease has been on Keflex for as long as 20 days. This is presumed to be strep. She saw Dr. Renold Don Past medical history includes type 2 diabetes, recurrent cellulitis of the right leg, right Achilles tendon repair a year ago for spontaneous rupture ABI in our clinic was 1.09 on the right 2/17; patient admitted the clinic last week had a thick callused area on the right lateral heel she is a diabetic with neuropathy. She had thick callus and across like area of open wound. I aggressively debrided this area to remove most of the callus and gave her Hydrofera Blue and heel off loader, heel cup and she has been wearing this religiously trying to keep the  pressure off the area. She comes in today with a cross-like area of injury has  healed she has a new open area that I do not remember from last time. Still some dry flaking skin and callus but not nearly as much as last week at which time I had to use a scalpel to remove this. Electronic Signature(s) Signed: 10/06/2020 5:29:55 PM By: Baltazar Najjar MD Entered By: Baltazar Najjar on 10/06/2020 16:07:02 -------------------------------------------------------------------------------- Physical Exam Details Patient Name: Date of Service: Donna [name] 10/06/2020 3:15 PM Medical Record Number: [id number] Patient Account Number: [id number] Date of Birth/Sex: Treating RN: November 20, 1965 (55 y.o. [name] Primary Care Provider: Hillard Danker Other Clinician: Referring Provider: Treating Provider/Extender: Shara Blazing in Treatment: 1 Constitutional Sitting or standing Blood Pressure is within target range for patient.. Pulse regular and within target range for patient.Marland Kitchen Respirations regular, non-labored and within target range.. Temperature is normal and within the target range for the patient.Marland Kitchen Appears in no distress. Notes Wound exam; area in question on the right lateral heel. Last week thick callus with a split and across formation. I took off most of this with a scalpel. She has a small open area remaining dry skin around some of this I removed with a #5 curette. A lot of this appears to be healed. I cannot see anything that really needs a biopsy Electronic Signature(s) Signed: 10/06/2020 5:29:55 PM By: Baltazar Najjar MD Entered By: Baltazar Najjar on 10/06/2020 16:08:13 -------------------------------------------------------------------------------- Physician Orders Details Patient Name: Date of Service: Donna [name] 10/06/2020 3:15 PM Medical Record Number: [id number] Patient Account Number: [id number] Date of Birth/Sex: Treating  RN: 07-04-1966 (55 y.o. [name] Primary Care Provider: Hillard Danker Other Clinician: Referring Provider: Treating Provider/Extender: Shara Blazing in Treatment: 1 Verbal / Phone Orders: No Diagnosis Coding Follow-up Appointments Return Appointment in 1 week. Bathing/ Shower/ Hygiene May shower with protection but do not get wound dressing(s) wet. - when not changing dressings. May shower and wash wound with soap and water. - with dressing changes only. Edema Control - Lymphedema / SCD / Other Avoid standing for long periods of time. Off-Loading Wedge shoe to: - offloading heel sandal. use while walking and standing. Other: - No pressure at all to right heel. Pillow under right leg to offload pressure to right heel while lying and standing. Wound Treatment Wound #1 - Calcaneus Wound Laterality: Right, Lateral Cleanser: Soap and Water Every Other Day/30 Days Discharge Instructions: May shower and wash wound with dial antibacterial soap and water prior to dressing change. Peri-Wound Care: Sween Lotion (Moisturizing lotion) Every Other Day/30 Days Discharge Instructions: Apply moisturizing lotion as directed to foot and leg. Prim Dressing: Hydrofera Blue Classic Foam, 2x2 in (Generic) Every Other Day/30 Days ary Discharge Instructions: Moisten with saline prior to applying to wound bed Secondary Dressing: ABD Pad, 5x9 Every Other Day/30 Days Discharge Instructions: or heel cup. Apply over primary dressing as directed. Secured With: [employer], 4.5x3.1 (in/yd) (Generic) Every Other Day/30 Days Discharge Instructions: Secure with Kerlix as directed. Secured With: 19M Medipore H Soft Cloth Surgical T 4 x 2 (in/yd) (Generic) Every Other Day/30 Days ape Discharge Instructions: Secure dressing with tape as directed. Electronic Signature(s) Signed: 10/06/2020 5:29:55 PM By: Baltazar Najjar MD Signed: 10/06/2020 5:51:10 PM By: Shawn Stall Entered By: Shawn Stall on 10/06/2020 15:53:46 -------------------------------------------------------------------------------- Problem List Details Patient Name: Date of Service: Donna [name] 10/06/2020 3:15 PM Medical Record Number: [id number] Patient Account Number: [id number] Date of Birth/Sex: Treating RN: 1966/03/12 (55 y.o. [name] Primary Care Provider: Hillard Danker  Other Clinician: Referring Provider: Treating Provider/Extender: Shara Blazingobson, Michael Crawford, Elizabeth Weeks in Treatment: 1 Active Problems ICD-10 Encounter Code Description Active Date MDM Diagnosis E11.621 Type 2 diabetes mellitus with foot ulcer 09/29/2020 No Yes L97.418 Non-pressure chronic ulcer of right heel and midfoot with other specified 09/29/2020 No Yes severity E11.42 Type 2 diabetes mellitus with diabetic polyneuropathy 09/29/2020 No Yes Inactive Problems Resolved Problems Electronic Signature(s) Signed: 10/06/2020 5:29:55 PM By: Baltazar Najjarobson, Michael MD Entered By: Baltazar Najjarobson, Michael on 10/06/2020 16:01:27 -------------------------------------------------------------------------------- Progress Note Details Patient Name: Date of Service: Donna BollmanURRIN, Jamil R. 10/06/2020 3:15 PM Medical Record Number: [id number] Patient Account Number: [id number] Date of Birth/Sex: Treating RN: 01/17/1966 (55 y.o. 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 She tells me that since at least last summer she has had a progressive callused area that has opened closed but is never really healed. She has a history of calluses on her feet. She is a diabetic with neuropathy. She was referred to Dr. Allena KatzPatel at [facility] who felt that this might be a large plantar wart she was treated with laser for 2 treatments and then subsequently with ammonium lactate none of this is apparently made any difference according to the patient. She has a very odd looking area with thick skin and callus and an almost crossed like area of open tissue. She also has erythema above this which she says is chronic. The other interesting part of her recent history is recurrent cellulitis on the right lower leg although this is separated from the area we are looking at. She has had trips to see infectious disease has been on Keflex for as long as 20 days. This is presumed to be strep. She saw Dr. Renold DonVu Past medical history includes type 2 diabetes, recurrent cellulitis of the right leg, right Achilles tendon repair a year ago for spontaneous rupture ABI in our clinic was 1.09 on the right 2/17; patient admitted the clinic last week had a thick callused area on the right lateral heel she is a diabetic with neuropathy. She had thick callus and across like area of open wound. I aggressively debrided this area to remove most of the callus and gave her Hydrofera Blue and heel off loader, heel cup and she has been wearing this religiously trying to keep the pressure off the area. She comes in today with a cross-like area of injury has healed she has a new open area that I do not remember from last time. Still some dry flaking skin and callus but not nearly as much as last week at which time I had to use a scalpel to remove this. Objective Constitutional Sitting or standing Blood Pressure is within target range for patient.. Pulse regular and within target range for patient.Marland Kitchen. Respirations regular, non-labored and within target range.. Temperature is normal and within the target range for the patient.Marland Kitchen. Appears in no distress. Vitals Time Taken: 3:23 PM, Height: 66 in, Source: Stated, Weight: 225 lbs, Source: Stated, BMI: 36.3, Temperature: 98.6  F, Pulse: 98 bpm, Respiratory Rate: 18 breaths/min, Blood Pressure: 138/81 mmHg. General Notes: Wound exam; area in question on the right lateral heel. Last week thick callus with a split and across formation. I took off most of this with a scalpel. She has a small  open area remaining dry skin around some of this I removed with a #5 curette. A lot of this appears to be healed. I cannot see anything that really needs a biopsy Integumentary (Hair, Skin) Wound #1 status is Open. Original cause of wound was Gradually Appeared. The wound is located on the Right,Lateral Calcaneus. The wound measures 0.6cm length x 0.6cm width x 0.1cm depth; 0.283cm^2 area and 0.028cm^3 volume. There is Fat Layer (Subcutaneous Tissue) exposed. There is no tunneling or undermining noted. There is a small amount of serous drainage noted. The wound margin is thickened. There is small (1-33%) pink granulation within the wound bed. There is a large (67-100%) amount of necrotic tissue within the wound bed including Adherent Slough. Assessment Active Problems ICD-10 Type 2 diabetes mellitus with foot ulcer Non-pressure chronic ulcer of right heel and midfoot with other specified severity Type 2 diabetes mellitus with diabetic polyneuropathy Procedures Wound #1 Pre-procedure diagnosis of Wound #1 is a Diabetic Wound/Ulcer of the Lower Extremity located on the Right,Lateral Calcaneus .Severity of Tissue Pre Debridement is: Limited to breakdown of skin. There was a Selective/Open Wound Skin/Epidermis Debridement with a total area of 9 sq cm performed by Maxwell Caul., MD. With the following instrument(s): Curette to remove Viable and Non-Viable tissue/material. Material removed includes Callus and Skin: Epidermis and after achieving pain control using Lidocaine 4% T opical Solution. A time out was conducted at 15:48, prior to the start of the procedure. There was no bleeding. The procedure was tolerated well with a pain  level of 0 throughout and a pain level of 0 following the procedure. Post Debridement Measurements: 0.6cm length x 0.6cm width x 0.1cm depth; 0.028cm^3 volume. Character of Wound/Ulcer Post Debridement is improved. Severity of Tissue Post Debridement is: Limited to breakdown of skin. Post procedure Diagnosis Wound #1: Same as Pre-Procedure Plan Follow-up Appointments: Return Appointment in 1 week. Bathing/ Shower/ Hygiene: May shower with protection but do not get wound dressing(s) wet. - when not changing dressings. May shower and wash wound with soap and water. - with dressing changes only. Edema Control - Lymphedema / SCD / Other: Avoid standing for long periods of time. Off-Loading: Wedge shoe to: - offloading heel sandal. use while walking and standing. Other: - No pressure at all to right heel. Pillow under right leg to offload pressure to right heel while lying and standing. WOUND #1: - Calcaneus Wound Laterality: Right, Lateral Cleanser: Soap and Water Every Other Day/30 Days Discharge Instructions: May shower and wash wound with dial antibacterial soap and water prior to dressing change. Peri-Wound Care: Sween Lotion (Moisturizing lotion) Every Other Day/30 Days Discharge Instructions: Apply moisturizing lotion as directed to foot and leg. Prim Dressing: Hydrofera Blue Classic Foam, 2x2 in (Generic) Every Other Day/30 Days ary Discharge Instructions: Moisten with saline prior to applying to wound bed Secondary Dressing: ABD Pad, 5x9 Every Other Day/30 Days Discharge Instructions: or heel cup. Apply over primary dressing as directed. Secured With: [employer], 4.5x3.1 (in/yd) (Generic) Every Other Day/30 Days Discharge Instructions: Secure with Kerlix as directed. Secured With: 41M Medipore H Soft Cloth Surgical T 4 x 2 (in/yd) (Generic) Every Other Day/30 Days ape Discharge Instructions: Secure dressing with tape as directed. 1. I am putting Hydrofera Blue over the small  open area. 2 heel cup/ABD. 3. Open heeled shoe 4. 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 [name] Primary Care Provider: Hillard Danker Other Clinician: Referring Provider: Treating Provider/Extender: Shara Blazing in Treatment: 1 Diagnosis Coding ICD-10 Codes Code Description [phone] Type 2 diabetes mellitus with foot ulcer L97.418 Non-pressure chronic ulcer of right heel and midfoot with other specified severity E11.42 Type 2 diabetes mellitus with diabetic polyneuropathy Facility Procedures CPT4 Code: [id number] Description: [phone] - DEBRIDE WOUND 1ST 20 SQ CM OR < ICD-10 Diagnosis Description L97.418 Non-pressure chronic ulcer of right heel and midfoot with other specified severi Modifier: ty Quantity: 1 Physician Procedures : CPT4 Code Description Modifier [id number] - WC PHYS DEBR WO ANESTH 20 SQ CM ICD-10 Diagnosis Description L97.418 Non-pressure chronic ulcer of right heel and midfoot with other specified severity Quantity: 1 Electronic Signature(s) Signed: 10/06/2020 5:29:55 PM By: Baltazar Najjar MD Entered By: Baltazar Najjar on 10/06/2020 16:09:44

## 2020-10-06 NOTE — Progress Notes (Signed)
Donna [name], Donna [name] ([id number]) Visit Report for 10/06/2020 Arrival Information Details Patient Name: Date of Service: Donna [name], Donna [name] 10/06/2020 3:15 PM Medical Record Number: [id number] Patient Account Number: [id number] Date of Birth/Sex: Treating RN: 1966-04-08 (55 y.o. Donna [name] Primary Care Terecia Plaut: Pricilla Holm Other Clinician: Referring Jerzy Roepke: Treating Audree Schrecengost/Extender: Charlie Pitter in Treatment: 1 Visit Information History Since Last Visit Added or deleted any medications: No Patient Arrived: Ambulatory Any new allergies or adverse reactions: No Arrival Time: 15:18 Had a fall or experienced change in No Accompanied By: self activities of daily living that may affect Transfer Assistance: None risk of falls: Patient Requires Transmission-Based Precautions: No Signs or symptoms of abuse/neglect since last visito No Patient Has Alerts: No Hospitalized since last visit: No Implantable device outside of the clinic excluding No cellular tissue based products placed in the center since last visit: Has Dressing in Place as Prescribed: Yes Has Footwear/Offloading in Place as Prescribed: Yes Right: Other:globoped Pain Present Now: No Electronic Signature(s) Signed: 10/06/2020 6:31:13 PM By: Baruch Gouty RN, BSN Entered By: Baruch Gouty on 10/06/2020 15:23:12 -------------------------------------------------------------------------------- Encounter Discharge Information Details Patient Name: Date of Service: Donna [name] 10/06/2020 3:15 PM Medical Record Number: [id number] Patient Account Number: [id number] Date of Birth/Sex: Treating RN: 1965/10/25 (56 y.o. Donna [name] Primary Care Veniamin Kincaid: Pricilla Holm Other Clinician: Referring Madi Bonfiglio: Treating Dot Splinter/Extender: Charlie Pitter in Treatment: 1 Encounter Discharge Information Items Post Procedure Vitals Discharge  Condition: Stable Temperature (F): 98.6 Ambulatory Status: Ambulatory Pulse (bpm): 98 Discharge Destination: Home Respiratory Rate (breaths/min): 18 Transportation: Private Auto Blood Pressure (mmHg): 138/81 Accompanied By: self Schedule Follow-up Appointment: Yes Clinical Summary of Care: Electronic Signature(s) Signed: 10/06/2020 5:51:10 PM By: Deon Pilling Entered By: Deon Pilling on 10/06/2020 17:21:35 -------------------------------------------------------------------------------- Lower Extremity Assessment Details Patient Name: Date of Service: Donna [name], Donna [name] 10/06/2020 3:15 PM Medical Record Number: [id number] Patient Account Number: [id number] Date of Birth/Sex: Treating RN: 1965-10-31 (55 y.o. Donna [name] Primary Care Amybeth Sieg: Pricilla Holm Other Clinician: Referring Abelardo Seidner: Treating Nichele Slawson/Extender: Charlie Pitter in Treatment: 1 Edema Assessment Assessed: [Left: No] [Right: No] Edema: [Left: N] [Right: o] Calf Left: Right: Point of Measurement: 32 cm From Medial Instep 37 cm Ankle Left: Right: Point of Measurement: 12 cm From Medial Instep 23 cm Vascular Assessment Pulses: Dorsalis Pedis Palpable: [Right:Yes] Electronic Signature(s) Signed: 10/06/2020 6:31:13 PM By: Baruch Gouty RN, BSN Entered By: Baruch Gouty on 10/06/2020 15:31:29 -------------------------------------------------------------------------------- Multi Wound Chart Details Patient Name: Date of Service: Donna [name] 10/06/2020 3:15 PM Medical Record Number: [id number] Patient Account Number: [id number] Date of Birth/Sex: Treating RN: September 17, 1965 (55 y.o. Donna [name] Primary Care Carmesha Morocco: Pricilla Holm Other Clinician: Referring Katurah Karapetian: Treating Lourdes Manning/Extender: Charlie Pitter in Treatment: 1 Vital Signs Height(in): 66 Pulse(bpm): 98 Weight(lbs): 225 Blood Pressure(mmHg):  138/81 Body Mass Index(BMI): 36 Temperature(F): 98.6 Respiratory Rate(breaths/min): 18 Photos: [1:No Photos Right, Lateral Calcaneus] [N/A:N/A N/A] Wound Location: [1:Gradually Appeared] [N/A:N/A] Wounding Event: [1:Diabetic Wound/Ulcer of the Lower] [N/A:N/A] Primary Etiology: [1:Extremity Hypertension, Type II Diabetes,] [N/A:N/A] Comorbid History: [1:Neuropathy 05/20/2020] [N/A:N/A] Date Acquired: [1:1] [N/A:N/A] Weeks of Treatment: [1:Open] [N/A:N/A] Wound Status: [1:0.6x0.6x0.1] [N/A:N/A] Measurements L x W x D (cm) [1:0.283] [N/A:N/A] A (cm) : rea [1:0.028] [N/A:N/A] Volume (cm) : [1:97.70%] [N/A:N/A] % Reduction in A rea: [1:99.60%] [N/A:N/A] % Reduction in Volume: [1:Grade 2] [N/A:N/A] Classification: [1:Small] [N/A:N/A] Exudate A mount: [1:Serous] [N/A:N/A] Exudate Type: [1:amber] [N/A:N/A] Exudate Color: [1:Thickened] [N/A:N/A] Wound Margin: [1:Small (1-33%)] [N/A:N/A] Granulation A  mount: [1:Pink] [N/A:N/A] Granulation Quality: [1:Large (67-100%)] [N/A:N/A] Necrotic A mount: [1:Fat Layer (Subcutaneous Tissue): Yes N/A] Exposed Structures: [1:Fascia: No Tendon: No Muscle: No Joint: No Bone: No None] [N/A:N/A] Epithelialization: [1:Debridement - Selective/Open Wound N/A] Debridement: Pre-procedure Verification/Time Out 15:48 [N/A:N/A] Taken: [1:Lidocaine 4% Topical Solution] [N/A:N/A] Pain Control: [1:Callus] [N/A:N/A] Tissue Debrided: [1:Skin/Epidermis] [N/A:N/A] Level: [1:9] [N/A:N/A] Debridement A (sq cm): [1:rea Curette] [N/A:N/A] Instrument: [1:None] [N/A:N/A] Bleeding: [1:0] [N/A:N/A] Procedural Pain: [1:0] [N/A:N/A] Post Procedural Pain: [1:Procedure was tolerated well] [N/A:N/A] Debridement Treatment Response: [1:0.6x0.6x0.1] [N/A:N/A] Post Debridement Measurements L x W x D (cm) [1:0.028] [N/A:N/A] Post Debridement Volume: (cm) [1:Debridement] [N/A:N/A] Treatment Notes Electronic Signature(s) Signed: 10/06/2020 5:29:55 PM By: Linton Ham  MD Signed: 10/06/2020 5:51:10 PM By: Deon Pilling Entered By: Linton Ham on 10/06/2020 16:01:36 -------------------------------------------------------------------------------- Multi-Disciplinary Care Plan Details Patient Name: Date of Service: Donna [name]. 10/06/2020 3:15 PM Medical Record Number: [id number] Patient Account Number: [id number] Date of Birth/Sex: Treating RN: 1965/08/21 (55 y.o. Donna [name], Donna [name] Primary Care Azha Constantin: Pricilla Holm Other Clinician: Referring Eulis Salazar: Treating Cherise Fedder/Extender: Charlie Pitter in Treatment: 1 Active Inactive Nutrition Nursing Diagnoses: Potential for alteratiion in Nutrition/Potential for imbalanced nutrition Goals: Patient/caregiver agrees to and verbalizes understanding of need to obtain nutritional consultation Date Initiated: 09/29/2020 Target Resolution Date: 11/25/2020 Goal Status: Active Patient/caregiver will maintain therapeutic glucose control Date Initiated: 09/29/2020 Target Resolution Date: 11/25/2020 Goal Status: Active Interventions: Provide education on elevated blood sugars and impact on wound healing Provide education on nutrition Treatment Activities: Obtain HgA1c : 09/29/2020 Patient referred to Primary Care Physician for further nutritional evaluation : 09/29/2020 Notes: Pain, Acute or Chronic Nursing Diagnoses: Pain, acute or chronic: actual or potential Potential alteration in comfort, pain Goals: Patient will verbalize adequate pain control and receive pain control interventions during procedures as needed Date Initiated: 09/29/2020 Target Resolution Date: 10/28/2020 Goal Status: Active Patient/caregiver will verbalize comfort level met Date Initiated: 09/29/2020 Target Resolution Date: 10/28/2020 Goal Status: Active Interventions: Encourage patient to take pain medications as prescribed Provide education on pain management Notes: Wound/Skin Impairment Nursing  Diagnoses: Knowledge deficit related to ulceration/compromised skin integrity Goals: Patient/caregiver will verbalize understanding of skin care regimen Date Initiated: 09/29/2020 Target Resolution Date: 10/28/2020 Goal Status: Active Interventions: Assess patient/caregiver ability to obtain necessary supplies Assess patient/caregiver ability to perform ulcer/skin care regimen upon admission and as needed Provide education on ulcer and skin care Treatment Activities: Skin care regimen initiated : 09/29/2020 Topical wound management initiated : 09/29/2020 Notes: Electronic Signature(s) Signed: 10/06/2020 5:51:10 PM By: Deon Pilling Entered By: Deon Pilling on 10/06/2020 15:49:32 -------------------------------------------------------------------------------- Pain Assessment Details Patient Name: Date of Service: Donna [name], Donna [name] 10/06/2020 3:15 PM Medical Record Number: [id number] Patient Account Number: [id number] Date of Birth/Sex: Treating RN: Nov 11, 1965 (55 y.o. Donna [name] Primary Care Corianne Buccellato: Pricilla Holm Other Clinician: Referring Rudene Poulsen: Treating Derian Pfost/Extender: Charlie Pitter in Treatment: 1 Active Problems Location of Pain Severity and Description of Pain Patient Has Paino No Patient Has Paino No Site Locations Rate the pain. Current Pain Level: 0 Pain Management and Medication Current Pain Management: Electronic Signature(s) Signed: 10/06/2020 6:31:13 PM By: Baruch Gouty RN, BSN Entered By: Baruch Gouty on 10/06/2020 15:23:54 -------------------------------------------------------------------------------- Patient/Caregiver Education Details Patient Name: Date of Service: Donna [name] 2/17/2022andnbsp3:15 PM Medical Record Number: [id number] Patient Account Number: [id number] Date of Birth/Gender: Treating RN: [birth year] (55 y.o. Donna [name] Primary Care Physician: Pricilla Holm Other  Clinician: Referring Physician: Treating Physician/Extender: Charlie Pitter in Treatment: 1 Education Assessment  Education Provided To: Patient Education Topics Provided Elevated Blood Sugar/ Impact on Healing: Handouts: Elevated Blood Sugars: How Do They Affect Wound Healing Methods: Explain/Verbal Responses: Reinforcements needed Electronic Signature(s) Signed: 10/06/2020 5:51:10 PM By: Deon Pilling Entered By: Deon Pilling on 10/06/2020 15:49:43 -------------------------------------------------------------------------------- Wound Assessment Details Patient Name: Date of Service: Donna [name], Donna [name] 10/06/2020 3:15 PM Medical Record Number: [id number] Patient Account Number: [id number] Date of Birth/Sex: Treating RN: 03-03-1966 (55 y.o. Donna [name] Primary Care Jaelyne Deeg: Other Clinician: Pricilla Holm Referring Cyd Hostler: Treating Kimori Tartaglia/Extender: Charlie Pitter in Treatment: 1 Wound Status Wound Number: 1 Primary Etiology: Diabetic Wound/Ulcer of the Lower Extremity Wound Location: Right, Lateral Calcaneus Wound Status: Open Wounding Event: Gradually Appeared Comorbid History: Hypertension, Type II Diabetes, Neuropathy Date Acquired: 05/20/2020 Weeks Of Treatment: 1 Clustered Wound: No Wound Measurements Length: (cm) 0.6 Width: (cm) 0.6 Depth: (cm) 0.1 Area: (cm) 0.283 Volume: (cm) 0.028 % Reduction in Area: 97.7% % Reduction in Volume: 99.6% Epithelialization: None Tunneling: No Undermining: No Wound Description Classification: Grade 2 Wound Margin: Thickened Exudate Amount: Small Exudate Type: Serous Exudate Color: amber Foul Odor After Cleansing: No Slough/Fibrino Yes Wound Bed Granulation Amount: Small (1-33%) Exposed Structure Granulation Quality: Pink Fascia Exposed: No Necrotic Amount: Large (67-100%) Fat Layer (Subcutaneous Tissue) Exposed: Yes Necrotic Quality: Adherent  Slough Tendon Exposed: No Muscle Exposed: No Joint Exposed: No Bone Exposed: No Treatment Notes Wound #1 (Calcaneus) Wound Laterality: Right, Lateral Cleanser Soap and Water Discharge Instruction: May shower and wash wound with dial antibacterial soap and water prior to dressing change. Peri-Wound Care Sween Lotion (Moisturizing lotion) Discharge Instruction: Apply moisturizing lotion as directed to foot and leg. Topical Primary Dressing Hydrofera Blue Classic Foam, 2x2 in Discharge Instruction: Moisten with saline prior to applying to wound bed Secondary Dressing ABD Pad, 5x9 Discharge Instruction: or heel cup. Apply over primary dressing as directed. Secured With [employer], 4.5x3.1 (in/yd) Discharge Instruction: Secure with Kerlix as directed. 25M Medipore H Soft Cloth Surgical T 4 x 2 (in/yd) ape Discharge Instruction: Secure dressing with tape as directed. Compression Wrap Compression Stockings Add-Ons Electronic Signature(s) Signed: 10/06/2020 6:31:13 PM By: Baruch Gouty RN, BSN Entered By: Baruch Gouty on 10/06/2020 15:33:58 -------------------------------------------------------------------------------- Vitals Details Patient Name: Date of Service: Donna [name] 10/06/2020 3:15 PM Medical Record Number: [id number] Patient Account Number: [id number] Date of Birth/Sex: Treating RN: Jun 27, 1966 (55 y.o. Donna [name] Primary Care Keiarah Orlowski: Pricilla Holm Other Clinician: Referring Nyazia Canevari: Treating Piera Downs/Extender: Charlie Pitter in Treatment: 1 Vital Signs Time Taken: 15:23 Temperature (F): 98.6 Height (in): 66 Pulse (bpm): 98 Source: Stated Respiratory Rate (breaths/min): 18 Weight (lbs): 225 Blood Pressure (mmHg): 138/81 Source: Stated Reference Range: 80 - 120 mg / dl Body Mass Index (BMI): 36.3 Electronic Signature(s) Signed: 10/06/2020 6:31:13 PM By: Baruch Gouty RN, BSN Entered By:  Baruch Gouty on 10/06/2020 15:23:41

## 2020-10-07 MED FILL — ATORVASTATIN 80 MG TABLET: 80 | 90 days supply | Qty: 90 | Fill #2

## 2020-10-13 ENCOUNTER — Other Ambulatory Visit: Payer: Self-pay

## 2020-10-13 ENCOUNTER — Encounter (HOSPITAL_BASED_OUTPATIENT_CLINIC_OR_DEPARTMENT_OTHER): Payer: 59 | Admitting: Internal Medicine

## 2020-10-13 DIAGNOSIS — L97418 Non-pressure chronic ulcer of right heel and midfoot with other specified severity: Secondary | ICD-10-CM | POA: Diagnosis not present

## 2020-10-13 DIAGNOSIS — E11621 Type 2 diabetes mellitus with foot ulcer: Secondary | ICD-10-CM | POA: Diagnosis not present

## 2020-10-13 DIAGNOSIS — E1151 Type 2 diabetes mellitus with diabetic peripheral angiopathy without gangrene: Secondary | ICD-10-CM | POA: Diagnosis not present

## 2020-10-13 DIAGNOSIS — L97412 Non-pressure chronic ulcer of right heel and midfoot with fat layer exposed: Secondary | ICD-10-CM | POA: Diagnosis not present

## 2020-10-13 DIAGNOSIS — E1142 Type 2 diabetes mellitus with diabetic polyneuropathy: Secondary | ICD-10-CM | POA: Diagnosis not present

## 2020-10-13 NOTE — Progress Notes (Signed)
Donna, [name] ([id number]) Visit Report for 10/13/2020 HPI Details Patient Name: Date of Service: Donna [name], Donna [name] 10/13/2020 3:00 PM Medical Record Number: [id number] Patient Account Number: [id number] Date of Birth/Sex: Treating RN: [birth year] (55 y.o. 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 She tells me that since at least last summer she has had a progressive callused area that has opened closed but is never really healed. She has a history of calluses on her feet. She is a diabetic with neuropathy. She was referred to Dr. Allena Katz at [facility] who felt that this might be a large plantar wart she was treated with laser for 2 treatments and then subsequently with ammonium lactate none of this is apparently made any difference according to the patient. She has a very odd looking area with thick skin and callus and an almost crossed like area of open tissue. She also has erythema above this which she says is chronic. The other interesting part of her recent history is recurrent cellulitis on the right lower leg although this is separated from the area we are looking at. She has had trips to see infectious disease has been on Keflex for as long as 20 days. This is presumed to be strep. She saw Dr. Renold Don Past medical history includes type 2 diabetes, recurrent cellulitis of the right leg, right Achilles tendon repair a year ago for spontaneous rupture ABI in our clinic was 1.09 on the right 2/17; patient admitted the clinic last week had a thick callused area on the right lateral heel she is a diabetic with neuropathy. She had thick callus and across like area of open wound. I aggressively  debrided this area to remove most of the callus and gave her Hydrofera Blue and heel off loader, heel cup and she has been wearing this religiously trying to keep the pressure off the area. She comes in today with a cross-like area of injury has healed she has a new open area that I do not remember from last time. Still some dry flaking skin and callus but not nearly as much as last week at which time I had to use a scalpel to remove this. 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 She came in 2 weeks ago with a very thick hyperkeratotic callused area on her right lateral heel. There was a "crossed" shaped area of split tissue with an open wound at the bottom. This had previously been treated as a possible wart without improvement. I removed all of the callus when she first came in here. This is mostly epithelialized over still thick skin but nothing like it was. She has 1 small open area that has not healed she has been more religious about offloading the area Electronic Signature(s) Signed: 10/13/2020 5:09:37 PM By: Baltazar Najjar MD Entered By: Baltazar Najjar on 10/13/2020 16:56:21 -------------------------------------------------------------------------------- Physical Exam Details Patient Name: Date of Service: Donna [name] 10/13/2020 3:00 PM Medical Record Number: [id number] Patient Account Number: [id number] Date of Birth/Sex: Treating RN: 1965-10-28 (55 y.o. [name] Primary Care Provider: Hillard Danker Other Clinician: Referring Provider: Treating Provider/Extender: Shara Blazing in Treatment: 2 Constitutional Sitting or standing Blood Pressure is within target range for patient.. Pulse regular and within target  range for patient.Marland Kitchen Respirations regular, non-labored and within target range.. Temperature is normal and within the target range for the patient.Marland Kitchen Appears in no distress. Cardiovascular Needle pulses  are palpable. Notes Wound exam; right lateral heel small open area measuring smaller. She has dry skin over a large part of this area not nearly as raised as it was. Electronic Signature(s) Signed: 10/13/2020 5:09:37 PM By: Baltazar Najjar MD Entered By: Baltazar Najjar on 10/13/2020 17:00:49 -------------------------------------------------------------------------------- Physician Orders Details Patient Name: Date of Service: Donna [name] 10/13/2020 3:00 PM Medical Record Number: [id number] Patient Account Number: [id number] Date of Birth/Sex: Treating RN: 01/01/1966 (55 y.o. [name] Primary Care Provider: Hillard Danker Other Clinician: Referring Provider: Treating Provider/Extender: Shara Blazing in Treatment: 2 Verbal / Phone Orders: No Diagnosis Coding Follow-up Appointments ppointment in 2 weeks. - 10/27/2020 Thursday Return A Bathing/ Shower/ Hygiene May shower with protection but do not get wound dressing(s) wet. - when not changing dressings. May shower and wash wound with soap and water. - with dressing changes only. Edema Control - Lymphedema / SCD / Other Avoid standing for long periods of time. Off-Loading Wedge shoe to: - offloading heel sandal. use while walking and standing. Other: - No pressure at all to right heel. Pillow under right leg to offload pressure to right heel while lying and standing. Wound Treatment Wound #1 - Calcaneus Wound Laterality: Right, Lateral Cleanser: Soap and Water Every Other Day/30 Days Discharge Instructions: May shower and wash wound with dial antibacterial soap and water prior to dressing change. Peri-Wound Care: Sween Lotion (Moisturizing lotion) Every Other Day/30 Days Discharge Instructions: Apply moisturizing lotion as directed to foot and leg. Prim Dressing: Hydrofera Blue Classic Foam, 2x2 in (Generic) Every Other Day/30 Days ary Discharge Instructions: Moisten with saline prior  to applying to wound bed Secondary Dressing: ABD Pad, 5x9 Every Other Day/30 Days Discharge Instructions: or heel cup. Apply over primary dressing as directed. Secured With: [employer], 4.5x3.1 (in/yd) (Generic) Every Other Day/30 Days Discharge Instructions: Secure with Kerlix as directed. Secured With: 35M Medipore H Soft Cloth Surgical T 4 x 2 (in/yd) (Generic) Every Other Day/30 Days ape Discharge Instructions: Secure dressing with tape as directed. Electronic Signature(s) Signed: 10/13/2020 5:09:37 PM By: Baltazar Najjar MD Signed: 10/13/2020 5:39:27 PM By: Shawn Stall Entered By: Shawn Stall on 10/13/2020 15:37:01 -------------------------------------------------------------------------------- Problem List Details Patient Name: Date of Service: Donna [name]. 10/13/2020 3:00 PM Medical Record Number: [id number] Patient Account Number: [id number] Date of Birth/Sex: Treating RN: 1966/06/04 (55 y.o. Donna [name], Donna [name] Primary Care Provider: Hillard Danker Other Clinician: Referring Provider: Treating Provider/Extender: Shara Blazing in Treatment: 2 Active Problems ICD-10 Encounter Code Description Active Date MDM Diagnosis E11.621 Type 2 diabetes mellitus with foot ulcer 09/29/2020 No Yes L97.418 Non-pressure chronic ulcer of right heel and midfoot with other specified 09/29/2020 No Yes severity E11.42 Type 2 diabetes mellitus with diabetic polyneuropathy 09/29/2020 No Yes Inactive Problems Resolved Problems Electronic Signature(s) Signed: 10/13/2020 5:09:37 PM By: Baltazar Najjar MD Entered By: Baltazar Najjar on 10/13/2020 16:54:06 -------------------------------------------------------------------------------- Progress Note Details Patient Name: Date of Service: Donna [name]. 10/13/2020 3:00 PM Medical Record Number: [id number] Patient Account Number: [id number] Date of Birth/Sex: Treating RN: 1965/10/23 (55 y.o. 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 She tells me that since at least last summer she has had a progressive callused area that has opened closed but is never really healed. She has a history of calluses on her feet. She is a diabetic with neuropathy. She was referred to Dr. Allena KatzPatel at [facility] who felt that this might be a large plantar wart she was treated with laser for 2 treatments and then subsequently with ammonium lactate none of this is apparently made any difference according to the patient. She has a very odd looking area with thick skin and callus and an almost crossed like area of open tissue. She also has erythema above this which she says is chronic. The other interesting part of her recent history is recurrent cellulitis on the right lower leg although this is separated from the area we are looking at. She has had trips to see infectious disease has been on Keflex for as long as 20 days. This is presumed to be strep. She saw Dr. Renold DonVu Past medical history includes type 2 diabetes, recurrent cellulitis of the right leg, right Achilles tendon repair a year ago for spontaneous rupture ABI in our clinic was 1.09 on the right 2/17; patient admitted the clinic last week had a thick callused area on the right lateral heel she is a diabetic with neuropathy. She had thick callus and across like area of open wound. I aggressively debrided this area to remove most of the callus and gave her Hydrofera Blue and heel off loader, heel cup and she has been wearing this religiously trying to keep the pressure off the area. She comes in today with a cross-like area of injury has healed she has a new open area  that I do not remember from last time. Still some dry flaking skin and callus but not nearly as much as last week at which time I had to use a scalpel to remove this. 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 She came in 2 weeks ago with a very thick hyperkeratotic callused area on her right lateral heel. There was a "crossed" shaped area of split tissue with an open wound at the bottom. This had previously been treated as a possible wart without improvement. I removed all of the callus when she first came in here. This is mostly epithelialized over still thick skin but nothing like it was. She has 1 small open area that has not healed she has been more religious about offloading the area Objective Constitutional Sitting or standing Blood Pressure is within target range for patient.. Pulse regular and within target range for patient.Marland Kitchen. Respirations regular, non-labored and within target range.. Temperature is normal and within the target range for the patient.Marland Kitchen. Appears in no distress. Vitals Time Taken: 3:07 PM, Height: 66 in, Weight: 225 lbs, BMI: 36.3, Temperature: 99.2 F, Pulse: 89 bpm, Respiratory Rate: 18 breaths/min, Blood Pressure: 121/78 mmHg. Cardiovascular Needle pulses are palpable. General Notes: Wound exam; right lateral heel small open area measuring smaller. She has dry skin over a large part of this area not nearly as raised as it was. Integumentary (Hair, Skin) Wound #1 status is Open. Original cause of wound was Gradually Appeared. The date acquired was: 05/20/2020. The wound has been in treatment 2 weeks. The wound is located on the Right,Lateral Calcaneus. The wound measures 0.4cm length x 0.3cm width x 0.1cm depth; 0.094cm^2 area and 0.009cm^3 volume. There is Fat Layer (Subcutaneous Tissue) exposed. There is  no tunneling or undermining noted. There is a small amount of serous drainage noted. The wound margin is flat and intact. There is large  (67-100%) red granulation within the wound bed. There is no necrotic tissue within the wound bed. Assessment Active Problems ICD-10 Type 2 diabetes mellitus with foot ulcer Non-pressure chronic ulcer of right heel and midfoot with other specified severity Type 2 diabetes mellitus with diabetic polyneuropathy Plan Follow-up Appointments: Return Appointment in 2 weeks. - 10/27/2020 Thursday Bathing/ Shower/ Hygiene: May shower with protection but do not get wound dressing(s) wet. - when not changing dressings. May shower and wash wound with soap and water. - with dressing changes only. Edema Control - Lymphedema / SCD / Other: Avoid standing for long periods of time. Off-Loading: Wedge shoe to: - offloading heel sandal. use while walking and standing. Other: - No pressure at all to right heel. Pillow under right leg to offload pressure to right heel while lying and standing. WOUND #1: - Calcaneus Wound Laterality: Right, Lateral Cleanser: Soap and Water Every Other Day/30 Days Discharge Instructions: May shower and wash wound with dial antibacterial soap and water prior to dressing change. Peri-Wound Care: Sween Lotion (Moisturizing lotion) Every Other Day/30 Days Discharge Instructions: Apply moisturizing lotion as directed to foot and leg. Prim Dressing: Hydrofera Blue Classic Foam, 2x2 in (Generic) Every Other Day/30 Days ary Discharge Instructions: Moisten with saline prior to applying to wound bed Secondary Dressing: ABD Pad, 5x9 Every Other Day/30 Days Discharge Instructions: or heel cup. Apply over primary dressing as directed. Secured With: [employer], 4.5x3.1 (in/yd) (Generic) Every Other Day/30 Days Discharge Instructions: Secure with Kerlix as directed. Secured With: 69M Medipore H Soft Cloth Surgical T 4 x 2 (in/yd) (Generic) Every Other Day/30 Days ape Discharge Instructions: Secure dressing with tape as directed. 1. On area on the lateral part of her heel although  I cannot really see anything that I think is worth biopsying. I do not think this was a plantar wart I think this was continued longstanding friction in this patient who is neuropathic. 2. She has a small open area remaining I think this is smaller on its way to closure Electronic Signature(s) Signed: 10/13/2020 5:09:37 PM By: Baltazar Najjar MD Entered By: Baltazar Najjar on 10/13/2020 17:02:32 -------------------------------------------------------------------------------- SuperBill Details Patient Name: Date of Service: Donna [name] 10/13/2020 Medical Record Number: [id number] Patient Account Number: [id number] Date of Birth/Sex: Treating RN: 1965-11-21 (55 y.o. [name] Primary Care Provider: Hillard Danker Other Clinician: Referring Provider: Treating Provider/Extender: Shara Blazing in Treatment: 2 Diagnosis Coding ICD-10 Codes Code Description [id number] Type 2 diabetes mellitus with foot ulcer L97.418 Non-pressure chronic ulcer of right heel and midfoot with other specified severity E11.42 Type 2 diabetes mellitus with diabetic polyneuropathy Facility Procedures CPT4 Code: [id number] Description: 99213 - WOUND CARE VISIT-LEV 3 EST PT Modifier: Quantity: 1 Physician Procedures : CPT4 Code Description Modifier [id number] - WC PHYS LEVEL 3 - EST PT ICD-10 Diagnosis Description E11.621 Type 2 diabetes mellitus with foot ulcer L97.418 Non-pressure chronic ulcer of right heel and midfoot with other specified severity E11.42 Type  2 diabetes mellitus with diabetic polyneuropathy Quantity: 1 Electronic Signature(s) Signed: 10/13/2020 5:09:37 PM By: Baltazar Najjar MD Entered By: Baltazar Najjar on 10/13/2020 17:03:01

## 2020-10-14 NOTE — Progress Notes (Addendum)
Donna [name], Donna [name] ([id number]) Visit Report for 10/13/2020 Arrival Information Details Patient Name: Date of Service: Donna [name], Donna [name] 10/13/2020 3:00 PM Medical Record Number: [id number] Patient Account Number: [id number] Date of Birth/Sex: Treating RN: 1966/07/12 (55 y.o. [name], Meta.Reding Primary Care Ariz Terrones: Pricilla Holm Other Clinician: Referring Marrian Bells: Treating Melanni Benway/Extender: Charlie Pitter in Treatment: 2 Visit Information History Since Last Visit Added or deleted any medications: No Patient Arrived: Ambulatory Any new allergies or adverse reactions: No Arrival Time: 15:06 Had a fall or experienced change in No Accompanied By: self activities of daily living that may affect Transfer Assistance: None risk of falls: Patient Identification Verified: Yes Signs or symptoms of abuse/neglect since last visito No Secondary Verification Process Completed: Yes Hospitalized since last visit: No Patient Requires Transmission-Based Precautions: No Implantable device outside of the clinic excluding No Patient Has Alerts: No cellular tissue based products placed in the center since last visit: Has Dressing in Place as Prescribed: Yes Pain Present Now: No Electronic Signature(s) Signed: 10/14/2020 1:28:50 PM By: Sandre Kitty Entered By: Sandre Kitty on 10/13/2020 15:06:55 -------------------------------------------------------------------------------- Clinic Level of Care Assessment Details Patient Name: Date of Service: JATAVIA, KELTNER 10/13/2020 3:00 PM Medical Record Number: [id number] Patient Account Number: [id number] Date of Birth/Sex: Treating RN: [birth year] (55 y.o. [name], [name] Primary Care Jahnessa Vanduyn: Pricilla Holm Other Clinician: Referring Rutger Salton: Treating Nil Xiong/Extender: Charlie Pitter in Treatment: 2 Clinic Level of Care Assessment Items TOOL 4 Quantity Score X- 1 0 Use when  only an EandM is performed on FOLLOW-UP visit ASSESSMENTS - Nursing Assessment / Reassessment X- 1 10 Reassessment of Co-morbidities (includes updates in patient status) X- 1 5 Reassessment of Adherence to Treatment Plan ASSESSMENTS - Wound and Skin A ssessment / Reassessment X - Simple Wound Assessment / Reassessment - one wound 1 5 []  - 0 Complex Wound Assessment / Reassessment - multiple wounds X- 1 10 Dermatologic / Skin Assessment (not related to wound area) ASSESSMENTS - Focused Assessment X- 1 5 Circumferential Edema Measurements - multi extremities X- 1 10 Nutritional Assessment / Counseling / Intervention []  - 0 Lower Extremity Assessment (monofilament, tuning fork, pulses) []  - 0 Peripheral Arterial Disease Assessment (using hand held doppler) ASSESSMENTS - Ostomy and/or Continence Assessment and Care []  - 0 Incontinence Assessment and Management []  - 0 Ostomy Care Assessment and Management (repouching, etc.) PROCESS - Coordination of Care X - Simple Patient / Family Education for ongoing care 1 15 []  - 0 Complex (extensive) Patient / Family Education for ongoing care X- 1 10 Staff obtains Programmer, systems, Records, T Results / Process Orders est []  - 0 Staff telephones HHA, Nursing Homes / Clarify orders / etc []  - 0 Routine Transfer to another Facility (non-emergent condition) []  - 0 Routine Hospital Admission (non-emergent condition) []  - 0 New Admissions / Biomedical engineer / Ordering NPWT Apligraf, etc. , []  - 0 Emergency Hospital Admission (emergent condition) X- 1 10 Simple Discharge Coordination []  - 0 Complex (extensive) Discharge Coordination PROCESS - Special Needs []  - 0 Pediatric / Minor Patient Management []  - 0 Isolation Patient Management []  - 0 Hearing / Language / Visual special needs []  - 0 Assessment of Community assistance (transportation, D/C planning, etc.) []  - 0 Additional assistance / Altered mentation []  - 0 Support  Surface(s) Assessment (bed, cushion, seat, etc.) INTERVENTIONS - Wound Cleansing / Measurement X - Simple Wound Cleansing - one wound 1 5 []  - 0 Complex Wound Cleansing - multiple wounds X- 1 5 Wound  Imaging (photographs - any number of wounds) []  - 0 Wound Tracing (instead of photographs) X- 1 5 Simple Wound Measurement - one wound []  - 0 Complex Wound Measurement - multiple wounds INTERVENTIONS - Wound Dressings []  - 0 Small Wound Dressing one or multiple wounds X- 1 15 Medium Wound Dressing one or multiple wounds []  - 0 Large Wound Dressing one or multiple wounds []  - 0 Application of Medications - topical []  - 0 Application of Medications - injection INTERVENTIONS - Miscellaneous []  - 0 External ear exam []  - 0 Specimen Collection (cultures, biopsies, blood, body fluids, etc.) []  - 0 Specimen(s) / Culture(s) sent or taken to Lab for analysis []  - 0 Patient Transfer (multiple staff / Civil Service fast streamer / Similar devices) []  - 0 Simple Staple / Suture removal (25 or less) []  - 0 Complex Staple / Suture removal (26 or more) []  - 0 Hypo / Hyperglycemic Management (close monitor of Blood Glucose) []  - 0 Ankle / Brachial Index (ABI) - do not check if billed separately X- 1 5 Vital Signs Has the patient been seen at the hospital within the last three years: Yes Total Score: 115 Level Of Care: New/Established - Level 3 Electronic Signature(s) Signed: 10/13/2020 5:39:27 PM By: Deon Pilling Entered By: Deon Pilling on 10/13/2020 15:37:47 -------------------------------------------------------------------------------- Encounter Discharge Information Details Patient Name: Date of Service: Donna [name] 10/13/2020 3:00 PM Medical Record Number: [id number] Patient Account Number: [id number] Date of Birth/Sex: Treating RN: 04-12-1966 (55 y.o. Elam Dutch Primary Care Magenta Schmiesing: Pricilla Holm Other Clinician: Referring Shelitha Magley: Treating Alleta Avery/Extender: Charlie Pitter in Treatment: 2 Encounter Discharge Information Items Discharge Condition: Stable Ambulatory Status: Ambulatory Discharge Destination: Home Transportation: Private Auto Accompanied By: self Schedule Follow-up Appointment: Yes Clinical Summary of Care: Patient Declined Electronic Signature(s) Signed: 10/13/2020 5:40:23 PM By: Baruch Gouty RN, BSN Entered By: Baruch Gouty on 10/13/2020 16:03:52 -------------------------------------------------------------------------------- Lower Extremity Assessment Details Patient Name: Date of Service: Donna [name], Donna [name] 10/13/2020 3:00 PM Medical Record Number: [id number] Patient Account Number: [id number] Date of Birth/Sex: Treating RN: Oct 14, 1965 (55 y.o. Elam Dutch Primary Care Jelan Batterton: Pricilla Holm Other Clinician: Referring Tamaria Dunleavy: Treating Angelissa Supan/Extender: Charlie Pitter in Treatment: 2 Edema Assessment Assessed: [Left: No] [Right: No] Edema: [Left: N] [Right: o] Calf Left: Right: Point of Measurement: 32 cm From Medial Instep 37 cm Ankle Left: Right: Point of Measurement: 12 cm From Medial Instep 23 cm Vascular Assessment Pulses: Dorsalis Pedis Palpable: [Right:Yes] Electronic Signature(s) Signed: 10/13/2020 5:40:23 PM By: Baruch Gouty RN, BSN Entered By: Baruch Gouty on 10/13/2020 15:17:38 -------------------------------------------------------------------------------- Multi Wound Chart Details Patient Name: Date of Service: Donna [name]. 10/13/2020 3:00 PM Medical Record Number: [id number] Patient Account Number: [id number] Date of Birth/Sex: Treating RN: 1965-11-09 (55 y.o. [name] Primary Care Seniya Stoffers: Pricilla Holm Other Clinician: Referring Zameria Vogl: Treating Sueko Dimichele/Extender: Charlie Pitter in Treatment: 2 Vital Signs Height(in): 66 Pulse(bpm): 23 Weight(lbs): 225 Blood  Pressure(mmHg): 121/78 Body Mass Index(BMI): 36 Temperature(F): 99.2 Respiratory Rate(breaths/min): 18 Photos: [1:No Photos Right, Lateral Calcaneus] [N/A:N/A N/A] Wound Location: [1:Gradually Appeared] [N/A:N/A] Wounding Event: [1:Diabetic Wound/Ulcer of the Lower] [N/A:N/A] Primary Etiology: [1:Extremity Hypertension, Type II Diabetes,] [N/A:N/A] Comorbid History: [1:Neuropathy 05/20/2020] [N/A:N/A] Date Acquired: [1:2] [N/A:N/A] Weeks of Treatment: [1:Open] [N/A:N/A] Wound Status: [1:0.4x0.3x0.1] [N/A:N/A] Measurements L x W x D (cm) [1:0.094] [N/A:N/A] A (cm) : rea [1:0.009] [N/A:N/A] Volume (cm) : [1:99.20%] [N/A:N/A] % Reduction in A rea: [1:99.90%] [N/A:N/A] % Reduction in Volume: [1:Grade 2] [N/A:N/A] Classification: [1:Small] [  N/A:N/A] Exudate A mount: [1:Serous] [N/A:N/A] Exudate Type: [1:amber] [N/A:N/A] Exudate Color: [1:Flat and Intact] [N/A:N/A] Wound Margin: [1:Large (67-100%)] [N/A:N/A] Granulation A mount: [1:Red] [N/A:N/A] Granulation Quality: [1:None Present (0%)] [N/A:N/A] Necrotic A mount: [1:Fat Layer (Subcutaneous Tissue): Yes N/A] Exposed Structures: [1:Fascia: No Tendon: No Muscle: No Joint: No Bone: No None] [N/A:N/A] Treatment Notes Wound #1 (Calcaneus) Wound Laterality: Right, Lateral Cleanser Soap and Water Discharge Instruction: May shower and wash wound with dial antibacterial soap and water prior to dressing change. Peri-Wound Care Sween Lotion (Moisturizing lotion) Discharge Instruction: Apply moisturizing lotion as directed to foot and leg. Topical Primary Dressing Hydrofera Blue Classic Foam, 2x2 in Discharge Instruction: Moisten with saline prior to applying to wound bed Secondary Dressing ABD Pad, 5x9 Discharge Instruction: or heel cup. Apply over primary dressing as directed. Secured With [employer], 4.5x3.1 (in/yd) Discharge Instruction: Secure with Kerlix as directed. 28M Medipore H Soft Cloth Surgical T 4 x 2  (in/yd) ape Discharge Instruction: Secure dressing with tape as directed. Compression Wrap Compression Stockings Add-Ons Electronic Signature(s) Signed: 10/13/2020 5:09:37 PM By: Linton Ham MD Signed: 10/13/2020 5:39:27 PM By: Deon Pilling Entered By: Linton Ham on 10/13/2020 16:54:14 -------------------------------------------------------------------------------- Multi-Disciplinary Care Plan Details Patient Name: Date of Service: Donna [name], Donna [name] 10/13/2020 3:00 PM Medical Record Number: [id number] Patient Account Number: [id number] Date of Birth/Sex: Treating RN: 1966/08/14 (55 y.o. [name], [name] Primary Care Chaselyn Nanney: Pricilla Holm Other Clinician: Referring Nathanyal Ashmead: Treating Jisel Fleet/Extender: Charlie Pitter in Treatment: 2 Active Inactive Nutrition Nursing Diagnoses: Potential for alteratiion in Nutrition/Potential for imbalanced nutrition Goals: Patient/caregiver agrees to and verbalizes understanding of need to obtain nutritional consultation Date Initiated: 09/29/2020 Target Resolution Date: 11/25/2020 Goal Status: Active Patient/caregiver will maintain therapeutic glucose control Date Initiated: 09/29/2020 Target Resolution Date: 11/25/2020 Goal Status: Active Interventions: Provide education on elevated blood sugars and impact on wound healing Provide education on nutrition Treatment Activities: Obtain HgA1c : 09/29/2020 Patient referred to Primary Care Physician for further nutritional evaluation : 09/29/2020 Notes: Pain, Acute or Chronic Nursing Diagnoses: Pain, acute or chronic: actual or potential Potential alteration in comfort, pain Goals: Patient will verbalize adequate pain control and receive pain control interventions during procedures as needed Date Initiated: 09/29/2020 Target Resolution Date: 10/28/2020 Goal Status: Active Patient/caregiver will verbalize comfort level met Date Initiated: 09/29/2020 Date  Inactivated: 10/13/2020 Target Resolution Date: 10/28/2020 Goal Status: Met Interventions: Encourage patient to take pain medications as prescribed Provide education on pain management Notes: Wound/Skin Impairment Nursing Diagnoses: Knowledge deficit related to ulceration/compromised skin integrity Goals: Patient/caregiver will verbalize understanding of skin care regimen Date Initiated: 09/29/2020 Target Resolution Date: 10/28/2020 Goal Status: Active Interventions: Assess patient/caregiver ability to obtain necessary supplies Assess patient/caregiver ability to perform ulcer/skin care regimen upon admission and as needed Provide education on ulcer and skin care Treatment Activities: Skin care regimen initiated : 09/29/2020 Topical wound management initiated : 09/29/2020 Notes: Electronic Signature(s) Signed: 10/13/2020 5:39:27 PM By: Deon Pilling Entered By: Deon Pilling on 10/13/2020 15:35:00 -------------------------------------------------------------------------------- Pain Assessment Details Patient Name: Date of Service: Donna [name], Donna [name] 10/13/2020 3:00 PM Medical Record Number: [id number] Patient Account Number: [id number] Date of Birth/Sex: Treating RN: Aug 14, 1966 (55 y.o. [name] Primary Care Daquarius Dubeau: Pricilla Holm Other Clinician: Referring Britanie Harshman: Treating Hershall Benkert/Extender: Charlie Pitter in Treatment: 2 Active Problems Location of Pain Severity and Description of Pain Patient Has Paino No Site Locations Pain Management and Medication Current Pain Management: Electronic Signature(s) Signed: 10/13/2020 5:39:27 PM By: Deon Pilling Signed: 10/14/2020 1:28:50 PM  By: Sandre Kitty Entered By: Sandre Kitty on 10/13/2020 15:08:15 -------------------------------------------------------------------------------- Patient/Caregiver Education Details Patient Name: Date of Service: Donna [name], Donna [name]  2/24/2022andnbsp3:00 PM Medical Record Number: [id number] Patient Account Number: [id number] Date of Birth/Gender: Treating RN: 02-12-1966 (55 y.o. [name] Primary Care Physician: Pricilla Holm Other Clinician: Referring Physician: Treating Physician/Extender: Charlie Pitter in Treatment: 2 Education Assessment Education Provided To: Patient Education Topics Provided Elevated Blood Sugar/ Impact on Healing: Handouts: Elevated Blood Sugars: How Do They Affect Wound Healing Methods: Explain/Verbal Responses: Reinforcements needed Electronic Signature(s) Signed: 10/13/2020 5:39:27 PM By: Deon Pilling Entered By: Deon Pilling on 10/13/2020 15:36:02 -------------------------------------------------------------------------------- Wound Assessment Details Patient Name: Date of Service: Donna [name], Donna [name] 10/13/2020 3:00 PM Medical Record Number: [id number] Patient Account Number: [id number] Date of Birth/Sex: Treating RN: 11-13-1965 (55 y.o. [name], Meta.Reding Primary Care Dreama Kuna: Pricilla Holm Other Clinician: Referring Jaleiah Asay: Treating Benji Poynter/Extender: Charlie Pitter in Treatment: 2 Wound Status Wound Number: 1 Primary Etiology: Diabetic Wound/Ulcer of the Lower Extremity Wound Location: Right, Lateral Calcaneus Wound Status: Open Wounding Event: Gradually Appeared Comorbid History: Hypertension, Type II Diabetes, Neuropathy Date Acquired: 05/20/2020 Weeks Of Treatment: 2 Clustered Wound: No Photos Wound Measurements Length: (cm) 0.4 Width: (cm) 0.3 Depth: (cm) 0.1 Area: (cm) 0.094 Volume: (cm) 0.009 % Reduction in Area: 99.2% % Reduction in Volume: 99.9% Epithelialization: None Tunneling: No Undermining: No Wound Description Classification: Grade 2 Wound Margin: Flat and Intact Exudate Amount: Small Exudate Type: Serous Exudate Color: amber Foul Odor After Cleansing:  No Slough/Fibrino Yes Wound Bed Granulation Amount: Large (67-100%) Exposed Structure Granulation Quality: Red Fascia Exposed: No Necrotic Amount: None Present (0%) Fat Layer (Subcutaneous Tissue) Exposed: Yes Tendon Exposed: No Muscle Exposed: No Joint Exposed: No Bone Exposed: No Treatment Notes Wound #1 (Calcaneus) Wound Laterality: Right, Lateral Cleanser Soap and Water Discharge Instruction: May shower and wash wound with dial antibacterial soap and water prior to dressing change. Peri-Wound Care Sween Lotion (Moisturizing lotion) Discharge Instruction: Apply moisturizing lotion as directed to foot and leg. Topical Primary Dressing Hydrofera Blue Classic Foam, 2x2 in Discharge Instruction: Moisten with saline prior to applying to wound bed Secondary Dressing ABD Pad, 5x9 Discharge Instruction: or heel cup. Apply over primary dressing as directed. Secured With [employer], 4.5x3.1 (in/yd) Discharge Instruction: Secure with Kerlix as directed. 32M Medipore H Soft Cloth Surgical T 4 x 2 (in/yd) ape Discharge Instruction: Secure dressing with tape as directed. Compression Wrap Compression Stockings Add-Ons Electronic Signature(s) Signed: 10/14/2020 4:34:48 PM By: Sandre Kitty Signed: 10/14/2020 5:07:55 PM By: Deon Pilling Previous Signature: 10/13/2020 5:39:27 PM Version By: Deon Pilling Previous Signature: 10/13/2020 5:40:23 PM Version By: Baruch Gouty RN, BSN Entered By: Sandre Kitty on 10/14/2020 16:06:14 -------------------------------------------------------------------------------- Vitals Details Patient Name: Date of Service: Donna [name]. 10/13/2020 3:00 PM Medical Record Number: [id number] Patient Account Number: [id number] Date of Birth/Sex: Treating RN: [birth year] (55 y.o. [name], [name] Primary Care Concepcion Kirkpatrick: Pricilla Holm Other Clinician: Referring Ayham Word: Treating Trevel Dillenbeck/Extender: Charlie Pitter in Treatment: 2 Vital Signs Time Taken: 15:07 Temperature (F): 99.2 Height (in): 66 Pulse (bpm): 89 Weight (lbs): 225 Respiratory Rate (breaths/min): 18 Body Mass Index (BMI): 36.3 Blood Pressure (mmHg): 121/78 Reference Range: 80 - 120 mg / dl Electronic Signature(s) Signed: 10/14/2020 1:28:50 PM By: Sandre Kitty Entered By: Sandre Kitty on 10/13/2020 15:08:03

## 2020-10-25 ENCOUNTER — Encounter: Payer: Self-pay | Admitting: Internal Medicine

## 2020-10-25 ENCOUNTER — Other Ambulatory Visit: Payer: Self-pay | Admitting: Internal Medicine

## 2020-10-25 ENCOUNTER — Ambulatory Visit: Payer: 59 | Admitting: Internal Medicine

## 2020-10-25 ENCOUNTER — Other Ambulatory Visit: Payer: Self-pay

## 2020-10-25 DIAGNOSIS — L03115 Cellulitis of right lower limb: Secondary | ICD-10-CM

## 2020-10-25 DIAGNOSIS — M79604 Pain in right leg: Secondary | ICD-10-CM | POA: Diagnosis not present

## 2020-10-25 MED ORDER — TRESIBA FLEXTOUCH 100 UNIT/ML ~~LOC~~ SOPN: 50.0000 [IU] | PEN_INJECTOR | Freq: Every day | SUBCUTANEOUS | 11 refills | Status: DC

## 2020-10-25 MED ORDER — CEPHALEXIN 500 MG PO CAPS: 500.0000 mg | ORAL_CAPSULE | Freq: Three times a day (TID) | ORAL | 0 refills | Status: DC

## 2020-10-25 MED FILL — BASAGLAR 100 UNIT/ML KWIKPE: 100 | 30 days supply | Qty: 30 | Fill #7

## 2020-10-25 MED FILL — CEPHALEXIN 500 MG CAPSULE: 500 | 18 days supply | Qty: 56 | Fill #0

## 2020-10-25 NOTE — Patient Instructions (Signed)
We will get the MRI of the leg to check and have sent in keflex to take 1 pill three times a day for 2-3 weeks for the cellulitis.

## 2020-10-27 ENCOUNTER — Encounter (HOSPITAL_BASED_OUTPATIENT_CLINIC_OR_DEPARTMENT_OTHER): Payer: 59 | Attending: Internal Medicine | Admitting: Physician Assistant

## 2020-10-27 ENCOUNTER — Other Ambulatory Visit: Payer: Self-pay

## 2020-10-27 DIAGNOSIS — L97418 Non-pressure chronic ulcer of right heel and midfoot with other specified severity: Secondary | ICD-10-CM | POA: Diagnosis not present

## 2020-10-27 DIAGNOSIS — L97412 Non-pressure chronic ulcer of right heel and midfoot with fat layer exposed: Secondary | ICD-10-CM | POA: Diagnosis not present

## 2020-10-27 DIAGNOSIS — E1142 Type 2 diabetes mellitus with diabetic polyneuropathy: Secondary | ICD-10-CM | POA: Diagnosis not present

## 2020-10-27 DIAGNOSIS — E11621 Type 2 diabetes mellitus with foot ulcer: Secondary | ICD-10-CM | POA: Diagnosis not present

## 2020-10-27 DIAGNOSIS — L84 Corns and callosities: Secondary | ICD-10-CM | POA: Insufficient documentation

## 2020-10-27 NOTE — Progress Notes (Addendum)
BERDELL, NEVITT ([id number]) Visit Report for 10/27/2020 Arrival Information Details Patient Name: Date of Service: Donna, [name] 10/27/2020 3:45 PM Medical Record Number: [id number] Patient Account Number: [id number] Date of Birth/Sex: Treating RN: 1965-11-27 (55 y.o. Nancy Fetter Primary Care Jakalyn Kratky: Pricilla Holm Other Clinician: Referring Hawken Bielby: Treating Cameren Earnest/Extender: Dreama Saa in Treatment: 4 Visit Information History Since Last Visit Added or deleted any medications: No Patient Arrived: Ambulatory Any new allergies or adverse reactions: No Arrival Time: 16:16 Had a fall or experienced change in No Accompanied By: alone activities of daily living that may affect Transfer Assistance: None risk of falls: Patient Identification Verified: Yes Signs or symptoms of abuse/neglect since last No Patient Requires Transmission-Based Precautions: No visito Patient Has Alerts: No Hospitalized since last visit: No Implantable device outside of the clinic excluding No cellular tissue based products placed in the center since last visit: Has Footwear/Offloading in Place as Prescribed: Yes Right: Other:heel offloading sandal Pain Present Now: No Electronic Signature(s) Signed: 10/27/2020 5:16:58 PM By: Levan Hurst RN, BSN Entered By: Levan Hurst on 10/27/2020 16:17:16 -------------------------------------------------------------------------------- Encounter Discharge Information Details Patient Name: Date of Service: Donna [name] 10/27/2020 3:45 PM Medical Record Number: [id number] Patient Account Number: [id number] Date of Birth/Sex: Treating RN: 1966/01/01 (55 y.o. [name] Primary Care Leonia Heatherly: Pricilla Holm Other Clinician: Referring Truett Mcfarlan: Treating Quoc Tome/Extender: Dreama Saa in Treatment: 4 Encounter Discharge Information Items Post Procedure Vitals Discharge  Condition: Stable Temperature (F): 98.3 Ambulatory Status: Ambulatory Pulse (bpm): 91 Discharge Destination: Home Respiratory Rate (breaths/min): 18 Transportation: Private Auto Blood Pressure (mmHg): 111/75 Accompanied By: self Schedule Follow-up Appointment: Yes Clinical Summary of Care: Electronic Signature(s) Signed: 10/27/2020 5:47:26 PM By: Deon Pilling Entered By: Deon Pilling on 10/27/2020 17:34:56 -------------------------------------------------------------------------------- Lower Extremity Assessment Details Patient Name: Date of Service: Donna, [name] 10/27/2020 3:45 PM Medical Record Number: [id number] Patient Account Number: [id number] Date of Birth/Sex: Treating RN: 1965-12-12 (55 y.o. Nancy Fetter Primary Care Jashley Yellin: Pricilla Holm Other Clinician: Referring Karletta Millay: Treating Holley Wirt/Extender: Dreama Saa in Treatment: 4 Edema Assessment Assessed: [Left: No] [Right: No] Edema: [Left: N] [Right: o] Calf Left: Right: Point of Measurement: 32 cm From Medial Instep 36 cm Ankle Left: Right: Point of Measurement: 12 cm From Medial Instep 23 cm Vascular Assessment Pulses: Dorsalis Pedis Palpable: [Right:Yes] Electronic Signature(s) Signed: 10/27/2020 5:16:58 PM By: Levan Hurst RN, BSN Entered By: Levan Hurst on 10/27/2020 16:25:39 -------------------------------------------------------------------------------- [place] Details Patient Name: Date of Service: Donna [name] 10/27/2020 3:45 PM Medical Record Number: [id number] Patient Account Number: [id number] Date of Birth/Sex: Treating RN: 03/25/1966 (55 y.o. [name], [name] Primary Care Anora Schwenke: Pricilla Holm Other Clinician: Referring Cyanna Neace: Treating Tarquin Welcher/Extender: Dreama Saa in Treatment: 4 Active Inactive Nutrition Nursing Diagnoses: Potential for alteratiion in  Nutrition/Potential for imbalanced nutrition Goals: Patient/caregiver agrees to and verbalizes understanding of need to obtain nutritional consultation Date Initiated: 09/29/2020 Target Resolution Date: 11/25/2020 Goal Status: Active Patient/caregiver will maintain therapeutic glucose control Date Initiated: 09/29/2020 Target Resolution Date: 11/25/2020 Goal Status: Active Interventions: Provide education on elevated blood sugars and impact on wound healing Provide education on nutrition Treatment Activities: Obtain HgA1c : 09/29/2020 Patient referred to Primary Care Physician for further nutritional evaluation : 09/29/2020 Notes: Pain, Acute or Chronic Nursing Diagnoses: Pain, acute or chronic: actual or potential Potential alteration in comfort, pain Goals: Patient will verbalize adequate pain control and receive pain control interventions during procedures as needed  Date Initiated: 09/29/2020 Target Resolution Date: 11/18/2020 Goal Status: Active Patient/caregiver will verbalize comfort level met Date Initiated: 09/29/2020 Date Inactivated: 10/13/2020 Target Resolution Date: 10/28/2020 Goal Status: Met Interventions: Encourage patient to take pain medications as prescribed Provide education on pain management Notes: Electronic Signature(s) Signed: 10/27/2020 5:47:26 PM By: Deon Pilling Entered By: Deon Pilling on 10/27/2020 16:44:31 -------------------------------------------------------------------------------- Pain Assessment Details Patient Name: Date of Service: Donna, [name] 10/27/2020 3:45 PM Medical Record Number: [id number] Patient Account Number: [id number] Date of Birth/Sex: Treating RN: June 20, 1966 (55 y.o. Nancy Fetter Primary Care Correll Denbow: Pricilla Holm Other Clinician: Referring Ulonda Klosowski: Treating Ajla Mcgeachy/Extender: Dreama Saa in Treatment: 4 Active Problems Location of Pain Severity and Description of Pain Patient  Has Paino No Site Locations Pain Management and Medication Current Pain Management: Electronic Signature(s) Signed: 10/27/2020 5:16:58 PM By: Levan Hurst RN, BSN Entered By: Levan Hurst on 10/27/2020 16:25:29 -------------------------------------------------------------------------------- Patient/Caregiver Education Details Patient Name: Date of Service: Donna [name] 3/10/2022andnbsp3:45 PM Medical Record Number: [id number] Patient Account Number: [id number] Date of Birth/Gender: Treating RN: 1965-11-22 (55 y.o. [name] Primary Care Physician: Pricilla Holm Other Clinician: Referring Physician: Treating Physician/Extender: Dreama Saa in Treatment: 4 Education Assessment Education Provided To: Patient Education Topics Provided Elevated Blood Sugar/ Impact on Healing: Handouts: Elevated Blood Sugars: How Do They Affect Wound Healing Methods: Explain/Verbal Responses: Reinforcements needed Electronic Signature(s) Signed: 10/27/2020 5:47:26 PM By: Deon Pilling Entered By: Deon Pilling on 10/27/2020 16:46:41 -------------------------------------------------------------------------------- Wound Assessment Details Patient Name: Date of Service: Donna, [name] 10/27/2020 3:45 PM Medical Record Number: [id number] Patient Account Number: [id number] Date of Birth/Sex: Treating RN: 1965/11/18 (55 y.o. Nancy Fetter Primary Care Estephanie Hubbs: Pricilla Holm Other Clinician: Referring Angee Gupton: Treating Varonica Siharath/Extender: Dreama Saa in Treatment: 4 Wound Status Wound Number: 1 Primary Etiology: Diabetic Wound/Ulcer of the Lower Extremity Wound Location: Right, Lateral Calcaneus Wound Status: Open Wounding Event: Gradually Appeared Comorbid History: Hypertension, Type II Diabetes, Neuropathy Date Acquired: 05/20/2020 Weeks Of Treatment: 4 Clustered Wound: No Photos Wound  Measurements Length: (cm) 0.2 Width: (cm) 0.2 Depth: (cm) 0.1 Area: (cm) 0.031 Volume: (cm) 0.003 % Reduction in Area: 99.8% % Reduction in Volume: 100% Epithelialization: Large (67-100%) Tunneling: No Undermining: No Wound Description Classification: Grade 2 Wound Margin: Thickened Exudate Amount: None Present Foul Odor After Cleansing: No Slough/Fibrino No Wound Bed Granulation Amount: None Present (0%) Exposed Structure Necrotic Amount: None Present (0%) Fascia Exposed: No Fat Layer (Subcutaneous Tissue) Exposed: No Tendon Exposed: No Muscle Exposed: No Joint Exposed: No Bone Exposed: No Treatment Notes Wound #1 (Calcaneus) Wound Laterality: Right, Lateral Cleanser Soap and Water Discharge Instruction: May shower and wash wound with dial antibacterial soap and water prior to dressing change. Peri-Wound Care Sween Lotion (Moisturizing lotion) Discharge Instruction: Apply moisturizing lotion as directed to foot and leg. Topical Primary Dressing Hydrofera Blue Classic Foam, 2x2 in Discharge Instruction: Moisten with saline prior to applying to wound bed Secondary Dressing ABD Pad, 5x9 Discharge Instruction: or heel cup. Apply over primary dressing as directed. Secured With [employer], 4.5x3.1 (in/yd) Discharge Instruction: Secure with Kerlix as directed. 29M Medipore H Soft Cloth Surgical T 4 x 2 (in/yd) ape Discharge Instruction: Secure dressing with tape as directed. Compression Wrap Compression Stockings Add-Ons Notes patient to bring in urea 40% cream next appt. Do not carry in office. Electronic Signature(s) Signed: 10/28/2020 11:17:21 AM By: Sandre Kitty Signed: 10/31/2020 6:16:37 PM By: Levan Hurst RN, BSN Previous Signature: 10/27/2020 5:16:58  PM Version By: Levan Hurst RN, BSN Previous Signature: 10/27/2020 5:16:58 PM Version By: Levan Hurst RN, BSN Entered By: Sandre Kitty on 10/28/2020  11:12:46 -------------------------------------------------------------------------------- Vitals Details Patient Name: Date of Service: Donna [name] 10/27/2020 3:45 PM Medical Record Number: [id number] Patient Account Number: [id number] Date of Birth/Sex: Treating RN: [birth year] (55 y.o. Nancy Fetter Primary Care Mohammad Granade: Pricilla Holm Other Clinician: Referring Saturnino Liew: Treating Jenasis Straley/Extender: Dreama Saa in Treatment: 4 Vital Signs Time Taken: 16:17 Temperature (F): 98.3 Height (in): 66 Pulse (bpm): 91 Weight (lbs): 225 Respiratory Rate (breaths/min): 18 Body Mass Index (BMI): 36.3 Blood Pressure (mmHg): 111/75 Reference Range: 80 - 120 mg / dl Electronic Signature(s) Signed: 10/27/2020 5:16:58 PM By: Levan Hurst RN, BSN Entered By: Levan Hurst on 10/27/2020 16:17:54

## 2020-10-27 NOTE — Progress Notes (Addendum)
Donna [name], Donna [name] ([id number]) Visit Report for 10/27/2020 Chief Complaint Document Details Patient Name: Date of Service: Donna [name], Donna [name] 10/27/2020 3:45 PM Medical Record Number: [id number] Patient Account Number: [id number] Date of Birth/Sex: Treating RN: 05/23/1966 (55 y.o. [name] Primary Care Provider: Hillard Danker Other Clinician: Referring Provider: Treating Provider/Extender: [name] in Treatment: 4 Information Obtained from: Patient Chief Complaint 09/29/2020; patient is here for review of a wound on the right lateral heel area Electronic Signature(s) Signed: 10/27/2020 4:21:21 PM By: Lenda Kelp PA-C Entered By: Lenda Kelp on 10/27/2020 16:21:21 -------------------------------------------------------------------------------- Debridement Details Patient Name: Date of Service: Donna [name] 10/27/2020 3:45 PM Medical Record Number: [id number] Patient Account Number: [id number] Date of Birth/Sex: Treating RN: March 10, 1966 (55 y.o. Donna [name], Donna [name] Primary Care Provider: Hillard Danker Other Clinician: Referring Provider: Treating Provider/Extender: [name] in Treatment: 4 Debridement Performed for Assessment: Wound #1 Right,Lateral Calcaneus Performed By: Physician Lenda Kelp, PA Debridement Type: Debridement Severity of Tissue Pre Debridement: Limited to breakdown of skin Level of Consciousness (Pre-procedure): Awake and Alert Pre-procedure Verification/Time Out Yes - 16:48 Taken: Start Time: 16:49 Pain Control: Lidocaine 4% T opical Solution T Area Debrided (L x W): otal 4 (cm) x 3 (cm) = 12 (cm) Tissue and other material debrided: Viable, Non-Viable, Callus, Subcutaneous, Skin: Dermis , Skin: Epidermis, Fibrin/Exudate Level: Skin/Subcutaneous Tissue Debridement Description: Excisional Instrument: Curette Bleeding: Minimum Hemostasis Achieved: Pressure End  Time: 16:56 Procedural Pain: 0 Post Procedural Pain: 0 Response to Treatment: Procedure was tolerated well Level of Consciousness (Post- Awake and Alert procedure): Post Debridement Measurements of Total Wound Length: (cm) 1.5 Width: (cm) 2.8 Depth: (cm) 0.1 Volume: (cm) 0.33 Character of Wound/Ulcer Post Debridement: Improved Severity of Tissue Post Debridement: Fat layer exposed Post Procedure Diagnosis Same as Pre-procedure Electronic Signature(s) Signed: 10/27/2020 5:28:00 PM By: Lenda Kelp PA-C Signed: 10/27/2020 5:47:26 PM By: Shawn Stall Entered By: Shawn Stall on 10/27/2020 16:57:55 -------------------------------------------------------------------------------- HPI Details Patient Name: Date of Service: Donna [name] 10/27/2020 3:45 PM Medical Record Number: [id number] Patient Account Number: [id number] Date of Birth/Sex: Treating RN: September 19, 1965 (55 y.o. 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 She tells me that since at least last summer she has had a progressive callused area that has opened closed but is never really healed. She has a history of calluses on her feet. She is a diabetic with neuropathy. She was referred to Dr. Allena Katz at [facility] who felt that this might be a large plantar wart she was treated with laser for 2 treatments and then subsequently with ammonium lactate none of this is apparently made any difference according to the patient. She has a very odd looking area with thick skin and callus and an almost crossed like area of open tissue. She also has erythema above this which she says is chronic. The other interesting part of her recent  history is recurrent cellulitis on the right lower leg although this is separated from the area we are looking at. She has had trips to see infectious disease has been on Keflex for as long as 20 days. This is presumed to be strep. She saw Dr. Renold Don Past medical history includes type 2 diabetes, recurrent cellulitis of  the right leg, right Achilles tendon repair a year ago for spontaneous rupture ABI in our clinic was 1.09 on the right 2/17; patient admitted the clinic last week had a thick callused area on the right lateral heel she is a diabetic with neuropathy. She had thick callus and across like area of open wound. I aggressively debrided this area to remove most of the callus and gave her Hydrofera Blue and heel off loader, heel cup and she has been wearing this religiously trying to keep the pressure off the area. She comes in today with a cross-like area of injury has healed she has a new open area that I do not remember from last time. Still some dry flaking skin and callus but not nearly as much as last week at which time I had to use a scalpel to remove this. 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 She came in 2 weeks ago with a very thick hyperkeratotic callused area on her right lateral heel. There was a "crossed" shaped area of split tissue with an open wound at the bottom. This had previously been treated as a possible wart without improvement. I removed all of the callus when she first came in here. This is mostly epithelialized over still thick skin but nothing like it was. She has 1 small open area that has not healed she has been more religious about offloading the area 10/27/2020 upon evaluation today patient appears to be doing well with regard to her wound although she does still apparently appear to have something still open in the base of the wound. Fortunately there is no evidence of infection at this time which is great news and in general I feel like  she is doing decently well though she has a lot of callus buildup she is using the offloading shoe for her heel. At home she does not always wear this she tells me. Generally she is using flip-flops there. Of note her primary care provider did give her Keflex yesterday for cellulitis of this leg but has nothing to do with the heel location. Electronic Signature(s) Signed: 10/27/2020 5:13:57 PM By: Lenda Kelp PA-C Previous Signature: 10/27/2020 5:13:35 PM Version By: Lenda Kelp PA-C Entered By: Lenda Kelp on 10/27/2020 17:13:57 -------------------------------------------------------------------------------- Physical Exam Details Patient Name: Date of Service: Donna [name], Donna [name] 10/27/2020 3:45 PM Medical Record Number: [id number] Patient Account Number: [id number] Date of Birth/Sex: Treating RN: 12-29-1965 (55 y.o. [name] Primary Care Provider: Hillard Danker Other Clinician: Referring Provider: Treating Provider/Extender: [name] in Treatment: 4 Constitutional Well-nourished and well-hydrated in no acute distress. Respiratory normal breathing without difficulty. Psychiatric this patient is able to make decisions and demonstrates good insight into disease process. Alert and Oriented x 3. pleasant and cooperative. Notes Upon inspection patient's wound actually had a significant bout of callus buildup around the wound there does appear to be a crack in the heel though I do not think this shows any signs of being an issue such as a wart which at one point apparently it was thought it may be I do think that she has a lot of callus buildup it could be related to her shoes in particular and the fact that there is more motion and friction here than what we would like to see. I did perform debridement to clear away some of the callus and slough from the surface of the wound shows a very small increase in  the main callus region and then  distal to that she has a small opening which was about the size of the curettes about 3 mm medially little less that still seems to be open in that regard. Electronic Signature(s) Signed: 10/27/2020 5:14:55 PM By: Lenda KelpStone III, Rickard Kennerly PA-C Entered By: Lenda KelpStone III, Zackory Pudlo on 10/27/2020 17:14:55 -------------------------------------------------------------------------------- Physician Orders Details Patient Name: Date of Service: Donna BollmanCURRIN, Donna R. 10/27/2020 3:45 PM Medical Record Number: [id number] Patient Account Number: [id number] Date of Birth/Sex: Treating RN: 02/23/1966 (55 y.o. 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 Bathing/ Shower/ Hygiene May shower with protection but do not get wound dressing(s) wet. - when not changing dressings. May shower and wash wound with soap and water. - with dressing changes only. Edema Control - Lymphedema / SCD / Other Avoid standing for long periods of time. Off-Loading Wedge shoe to: - offloading heel sandal. use while walking and standing. Other: - No pressure at all to right heel. Pillow under right leg to offload pressure to right heel while lying and standing. Wound Treatment Wound #1 - Calcaneus Wound Laterality: Right, Lateral Cleanser: Soap and Water 1 x Per Day/30 Days Discharge Instructions: May shower and wash wound with dial antibacterial soap and water prior to dressing change. Peri-Wound Care: Sween Lotion (Moisturizing lotion) 1 x Per Day/30 Days Discharge Instructions:  Apply moisturizing lotion as directed to foot and leg. Topical: urea 40% cream 1 x Per Day/30 Days Discharge Instructions: apply to callous area and apply to wound bed under the hydrofera blue. Prim Dressing: Hydrofera Blue Classic Foam, 2x2 in (Generic) 1 x Per Day/30 Days ary Discharge Instructions: Moisten with saline prior to applying to wound bed Secondary Dressing: ABD Pad, 5x9 1 x Per Day/30 Days Discharge Instructions: or heel cup. Apply over primary dressing as directed. Secured With: [employer] Roll Sterile, 4.5x3.1 (in/yd) (Generic) 1 x Per Day/30 Days Discharge Instructions: Secure with Kerlix as directed. Secured With: 85M Medipore H Soft Cloth Surgical T 4 x 2 (in/yd) (Generic) 1 x Per Day/30 Days ape Discharge Instructions: Secure dressing with tape as directed. Electronic Signature(s) Signed: 10/27/2020 5:28:00 PM By: Lenda KelpStone III, Kashawn Dirr PA-C Signed: 10/27/2020 5:47:26 PM By: Shawn Stalleaton, Bobbi Entered By: Shawn Stalleaton, Bobbi on 10/27/2020 16:58:27 -------------------------------------------------------------------------------- Problem List Details Patient Name: Date of Service: Donna BollmanURRIN, Donna R. 10/27/2020 3:45 PM Medical Record Number: [id number] Patient Account Number: [id number] Date of Birth/Sex: Treating RN: 08/03/1966 (55 y.o. Donna PickettF) Deaton, Donna KendallBobbi Primary Care Provider: Hillard Dankerrawford, Elizabeth Other Clinician: Referring Provider: Treating Provider/Extender: Jolee EwingStone III, Phuc Kluttz Crawford, Elizabeth Weeks in Treatment: 4 Active Problems ICD-10 Encounter Code Description Active Date MDM Diagnosis E11.621 Type 2 diabetes mellitus with foot ulcer 09/29/2020 No Yes L97.418 Non-pressure chronic ulcer of right heel and midfoot with other specified 09/29/2020 No Yes severity E11.42 Type 2 diabetes mellitus with diabetic polyneuropathy 09/29/2020 No Yes Inactive Problems Resolved Problems Electronic Signature(s) Signed: 10/27/2020 4:21:03 PM By: Lenda KelpStone III, Veta Dambrosia PA-C Entered By: Lenda KelpStone III, Afsheen Antony on  10/27/2020 16:21:03 -------------------------------------------------------------------------------- Progress Note Details Patient Name: Date of Service: Donna BollmanURRIN, Donna R. 10/27/2020 3:45 PM Medical Record Number: [id number] Patient Account Number: [id number] Date of Birth/Sex: Treating RN: 02/01/1966 (  55 y.o. 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 She tells me that since at least last summer she has had a progressive callused area that has opened closed but is never really healed. She has a history of calluses on her feet. She is a diabetic with neuropathy. She was referred to Dr. Allena Katz at [facility] who felt that this might be a large plantar wart she was treated with laser for 2 treatments and then subsequently with ammonium lactate none of this is apparently made any difference according to the patient. She has a very odd looking area with thick skin and callus and an almost crossed like area of open tissue. She also has erythema above this which she says is chronic. The other interesting part of her recent history is recurrent cellulitis on the right lower leg although this is separated from the area we are looking at. She has had trips to see infectious disease has been on Keflex for as long as 20 days. This is presumed to be strep. She saw Dr. Renold Don Past medical history includes type 2 diabetes, recurrent cellulitis of the right leg, right Achilles tendon repair a year ago for spontaneous rupture ABI in our clinic was 1.09 on the right 2/17; patient admitted  the clinic last week had a thick callused area on the right lateral heel she is a diabetic with neuropathy. She had thick callus and across like area of open wound. I aggressively debrided this area to remove most of the callus and gave her Hydrofera Blue and heel off loader, heel cup and she has been wearing this religiously trying to keep the pressure off the area. She comes in today with a cross-like area of injury has healed she has a new open area that I do not remember from last time. Still some dry flaking skin and callus but not nearly as much as last week at which time I had to use a scalpel to remove this. 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 She came in 2 weeks ago with a very thick hyperkeratotic callused area on her right lateral heel. There was a "crossed" shaped area of split tissue with an open wound at the bottom. This had previously been treated as a possible wart without improvement. I removed all of the callus when she first came in here. This is mostly epithelialized over still thick skin but nothing like it was. She has 1 small open area that has not healed she has been more religious about offloading the area 10/27/2020 upon evaluation today patient appears to be doing well with regard to her wound although she does still apparently appear to have something still open in the base of the wound. Fortunately there is no evidence of infection at this time which is great news and in general I feel like she is doing decently well though she has a lot of callus buildup she is using the offloading shoe for her heel. At home she does not always wear this she tells me. Generally she is using flip-flops there. Of note her primary care provider did give her  Keflex yesterday for cellulitis of this leg but has nothing to do with the heel location. Objective Constitutional Well-nourished and well-hydrated in no acute distress. Vitals Time Taken: 4:17 PM, Height:  66 in, Weight: 225 lbs, BMI: 36.3, Temperature: 98.3 F, Pulse: 91 bpm, Respiratory Rate: 18 breaths/min, Blood Pressure: 111/75 mmHg. Respiratory normal breathing without difficulty. Psychiatric this patient is able to make decisions and demonstrates good insight into disease process. Alert and Oriented x 3. pleasant and cooperative. General Notes: Upon inspection patient's wound actually had a significant bout of callus buildup around the wound there does appear to be a crack in the heel though I do not think this shows any signs of being an issue such as a wart which at one point apparently it was thought it may be I do think that she has a lot of callus buildup it could be related to her shoes in particular and the fact that there is more motion and friction here than what we would like to see. I did perform debridement to clear away some of the callus and slough from the surface of the wound shows a very small increase in the main callus region and then distal to that she has a small opening which was about the size of the curettes about 3 mm medially little less that still seems to be open in that regard. Integumentary (Hair, Skin) Wound #1 status is Open. Original cause of wound was Gradually Appeared. The date acquired was: 05/20/2020. The wound has been in treatment 4 weeks. The wound is located on the Right,Lateral Calcaneus. The wound measures 0.2cm length x 0.2cm width x 0.1cm depth; 0.031cm^2 area and 0.003cm^3 volume. There is no tunneling or undermining noted. There is a none present amount of drainage noted. The wound margin is thickened. There is no granulation within the wound bed. There is no necrotic tissue within the wound bed. 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 There was a Excisional Skin/Subcutaneous Tissue Debridement with a total area of 12 sq cm performed by Lenda Kelp, PA. With the following instrument(s): Curette to remove Viable and Non-Viable tissue/material. Material removed includes Callus, Subcutaneous Tissue, Skin: Dermis, Skin: Epidermis, and Fibrin/Exudate after achieving pain control using Lidocaine 4% T opical Solution. A time out was conducted at 16:48, prior to the start of the procedure. A Minimum amount of bleeding was controlled with Pressure. The procedure was tolerated well with a pain level of 0 throughout and a pain level of 0 following the procedure. Post Debridement Measurements: 1.5cm length x 2.8cm width x 0.1cm depth; 0.33cm^3 volume. Character of Wound/Ulcer Post Debridement is improved. Severity of Tissue Post Debridement is: Fat layer exposed. Post procedure Diagnosis Wound #1: Same as Pre-Procedure Plan Follow-up Appointments: Return Appointment in 1 week. Bathing/ Shower/ Hygiene: May shower with protection but do not get wound dressing(s) wet. - when not changing dressings. May shower and wash wound with soap and water. - with dressing changes only. Edema Control - Lymphedema / SCD / Other: Avoid standing for long periods of time. Off-Loading: Wedge shoe to: - offloading heel sandal. use while walking and standing. Other: - No pressure at all to  right heel. Pillow under right leg to offload pressure to right heel while lying and standing. WOUND #1: - Calcaneus Wound Laterality: Right, Lateral Cleanser: Soap and Water 1 x Per Day/30 Days Discharge Instructions: May shower and wash wound with dial antibacterial soap and water prior to dressing change. Peri-Wound Care: Sween Lotion (Moisturizing lotion) 1 x Per Day/30 Days Discharge  Instructions: Apply moisturizing lotion as directed to foot and leg. Topical: urea 40% cream 1 x Per Day/30 Days Discharge Instructions: apply to callous area and apply to wound bed under the hydrofera blue. Prim Dressing: Hydrofera Blue Classic Foam, 2x2 in (Generic) 1 x Per Day/30 Days ary Discharge Instructions: Moisten with saline prior to applying to wound bed Secondary Dressing: ABD Pad, 5x9 1 x Per Day/30 Days Discharge Instructions: or heel cup. Apply over primary dressing as directed. Secured With: [employer], 4.5x3.1 (in/yd) (Generic) 1 x Per Day/30 Days Discharge Instructions: Secure with Kerlix as directed. Secured With: 45M Medipore H Soft Cloth Surgical T 4 x 2 (in/yd) (Generic) 1 x Per Day/30 Days ape Discharge Instructions: Secure dressing with tape as directed. 1. Would recommend currently that we actually have the patient continue with the [facility] though my opinion would be for her to use lotion underneath the [facility] and then keep this wrapped in order to protect it as well. 2. Also recommend a 40% urea cream for her which can be purchased off of [employer]. We gave her that information today hopefully that would help keep things a little bit more moist as far as the wound bed is concerned. 3. I am also can recommend that we have the patient continue to monitor for any signs of infection. Obviously right now I do not see anything that appears to be infected in regard to the heel though she is on medication for cellulitis of her leg. We will see patient back for reevaluation in 1 week here in the clinic. If anything worsens or changes patient will contact our office for additional recommendations. Electronic Signature(s) Signed: 10/27/2020 5:16:07 PM By: Lenda Kelp PA-C Entered By: Lenda Kelp on 10/27/2020 17:16:07 -------------------------------------------------------------------------------- SuperBill Details Patient Name: Date of  Service: JASMARIE, COPPOCK 10/27/2020 Medical Record Number: [id number] Patient Account Number: [id number] Date of Birth/Sex: Treating RN: February 28, 1966 (55 y.o. 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 E11.42 Type 2 diabetes mellitus with diabetic polyneuropathy Facility Procedures CPT4 Code: [id number] Description: 11042 - DEB SUBQ TISSUE 20 SQ CM/< ICD-10 Diagnosis Description L97.418 Non-pressure chronic ulcer of right heel and midfoot with other specified sev Modifier: erity Quantity: 1 Physician Procedures : CPT4 Code Description Modifier [id number] 11042 - WC PHYS SUBQ TISS 20 SQ CM ICD-10 Diagnosis Description L97.418 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

## 2020-10-27 NOTE — Progress Notes (Signed)
   Subjective:   Patient ID: Donna [name], female    DOB: October 17, 1965, 55 y.o.   MRN: [id number]  HPI The patient is a 55 YO female coming in for recurrent cellulitis (on the right leg, started 2 days ago with groin swelling and redness, has been having fevers) and right lower leg pain (heard a popping sound and severe pain followed in the right calf region, denies swelling but severe pain, started getting cellulitis in the last 2 days, taking tylenol which is not helping much).   Review of Systems  Constitutional: Negative.   HENT: Negative.   Eyes: Negative.   Respiratory: Negative for cough, chest tightness and shortness of breath.   Cardiovascular: Negative for chest pain, palpitations and leg swelling.  Gastrointestinal: Negative for abdominal distention, abdominal pain, constipation, diarrhea, nausea and vomiting.  Musculoskeletal: Positive for arthralgias and myalgias.  Skin: Positive for color change and rash.  Neurological: Negative.   Psychiatric/Behavioral: Negative.     Objective:  Physical Exam Constitutional:      Appearance: She is well-developed.  HENT:     Head: Normocephalic and atraumatic.  Cardiovascular:     Rate and Rhythm: Normal rate and regular rhythm.  Pulmonary:     Effort: Pulmonary effort is normal. No respiratory distress.     Breath sounds: Normal breath sounds. No wheezing or rales.  Abdominal:     General: Bowel sounds are normal. There is no distension.     Palpations: Abdomen is soft.     Tenderness: There is no abdominal tenderness. There is no rebound.  Musculoskeletal:        General: Tenderness present.     Cervical back: Normal range of motion.  Skin:    General: Skin is warm and dry.     Findings: Rash present.     Comments: Cellulitis recurrent right medial shin, pain in the lateral shin, no true calf tenderness or swelling  Neurological:     Mental Status: She is alert and oriented to person, place, and time.     Coordination:  Coordination normal.     Vitals:   10/25/20 1431  BP: 138/90  Pulse: (!) 102  Resp: 18  Temp: 100.2 F (37.9 C)  TempSrc: Oral  SpO2: 97%  Weight: 225 lb 12.8 oz (102.4 kg)  Height: 5\' 6"  (1.676 m)    This visit occurred during the SARS-CoV-2 public health emergency.  Safety protocols were in place, including screening questions prior to the visit, additional usage of staff PPE, and extensive cleaning of exam room while observing appropriate contact time as indicated for disinfecting solutions.   Assessment & Plan:

## 2020-10-28 ENCOUNTER — Encounter: Payer: Self-pay | Admitting: Internal Medicine

## 2020-10-28 NOTE — Assessment & Plan Note (Signed)
Cellulitis is recurrent multiple times in the past 6 months. Rx keflex 2-3 week course. Ordered MRI leg to evaluate popping source of pain and recurrent cellulitis.

## 2020-10-28 NOTE — Assessment & Plan Note (Signed)
Ordered MRI to rule out tendon tear/rupture. She has had previous achilles tendon tear and repair.

## 2020-11-02 ENCOUNTER — Other Ambulatory Visit: Payer: Self-pay | Admitting: Internal Medicine

## 2020-11-03 ENCOUNTER — Other Ambulatory Visit: Payer: Self-pay

## 2020-11-03 ENCOUNTER — Other Ambulatory Visit: Payer: Self-pay | Admitting: Internal Medicine

## 2020-11-03 ENCOUNTER — Encounter (HOSPITAL_BASED_OUTPATIENT_CLINIC_OR_DEPARTMENT_OTHER): Payer: 59 | Admitting: Internal Medicine

## 2020-11-03 DIAGNOSIS — L84 Corns and callosities: Secondary | ICD-10-CM | POA: Diagnosis not present

## 2020-11-03 DIAGNOSIS — L97418 Non-pressure chronic ulcer of right heel and midfoot with other specified severity: Secondary | ICD-10-CM | POA: Diagnosis not present

## 2020-11-03 DIAGNOSIS — E1142 Type 2 diabetes mellitus with diabetic polyneuropathy: Secondary | ICD-10-CM | POA: Diagnosis not present

## 2020-11-03 DIAGNOSIS — E11621 Type 2 diabetes mellitus with foot ulcer: Secondary | ICD-10-CM | POA: Diagnosis not present

## 2020-11-03 NOTE — Progress Notes (Signed)
Donna [name], Donna [name] ([id number]) Visit Report for 11/03/2020 Arrival Information Details Patient Name: Date of Service: Donna [name], Donna [name] 11/03/2020 4:00 PM Medical Record Number: [id number] Patient Account Number: [id number] Date of Birth/Sex: Treating RN: 01/31/1966 (55 y.o. Donna [name], Donna [name] Primary Care Donna [name]: Donna [name] Other Clinician: Referring Donna [name]: Treating Donna [name]/Extender: Donna [name] in [name]: 5 Visit Information History Since Last Visit Added or deleted any medications: No Patient Arrived: Ambulatory Any new allergies or adverse reactions: No Arrival Time: 16:06 Had a fall or experienced change in No Transfer Assistance: None activities of daily living that may affect Patient Identification Verified: Yes risk of falls: Secondary Verification Process Completed: Yes Signs or symptoms of abuse/neglect since last visito No Patient Requires Transmission-Based Precautions: No Hospitalized since last visit: No Patient Has Alerts: No Implantable device outside of the clinic excluding No cellular tissue based products placed in the center since last visit: Has Dressing in Place as Prescribed: Yes Has Footwear/Offloading in Place as Prescribed: Yes Right: Wedge Shoe Pain Present Now: No Electronic Signature(s) Signed: 11/03/2020 5:43:09 PM By: Donna [name] Entered By: Donna [name] on 11/03/2020 16:08:20 -------------------------------------------------------------------------------- Clinic Level of Care Assessment Details Patient Name: Date of Service: Donna [name] 11/03/2020 4:00 PM Medical Record Number: [id number] Patient Account Number: [id number] Date of Birth/Sex: Treating RN: [birth year] (55 y.o. Donna [name], Donna [name] Primary Care Donna [name]: Donna [name] Other Clinician: Referring Donna [name]: Treating Donna [name]/Extender: Donna [name] in [name]: 5 Clinic Level of Care Assessment  Items TOOL 4 Quantity Score X- 1 0 Use when only an EandM is performed on FOLLOW-UP visit ASSESSMENTS - Nursing Assessment / Reassessment X- 1 10 Reassessment of Co-morbidities (includes updates in patient status) X- 1 5 Reassessment of Adherence to [name] Plan ASSESSMENTS - Wound and Skin A ssessment / Reassessment X - Simple Wound Assessment / Reassessment - one wound 1 5 []  - 0 Complex Wound Assessment / Reassessment - multiple wounds X- 1 10 Dermatologic / Skin Assessment (not related to wound area) ASSESSMENTS - Focused Assessment X- 1 5 Circumferential Edema Measurements - multi extremities []  - 0 Nutritional Assessment / Counseling / Intervention []  - 0 Lower Extremity Assessment (monofilament, tuning fork, pulses) []  - 0 Peripheral Arterial Disease Assessment (using hand held doppler) ASSESSMENTS - Ostomy and/or Continence Assessment and Care []  - 0 Incontinence Assessment and Management []  - 0 Ostomy Care Assessment and Management (repouching, etc.) PROCESS - Coordination of Care X - Simple Patient / Family Education for ongoing care 1 15 []  - 0 Complex (extensive) Patient / Family Education for ongoing care X- 1 10 Staff obtains , Records, T Results / Process Orders est []  - 0 Staff telephones HHA, Nursing Homes / Clarify orders / etc []  - 0 Routine Transfer to another Facility (non-emergent condition) []  - 0 Routine Hospital Admission (non-emergent condition) []  - 0 New Admissions / / Ordering NPWT Apligraf, etc. , []  - 0 Emergency Hospital Admission (emergent condition) X- 1 10 Simple Discharge Coordination []  - 0 Complex (extensive) Discharge Coordination PROCESS - Special Needs []  - 0 Pediatric / Minor Patient Management []  - 0 Isolation Patient Management []  - 0 Hearing / Language / Visual special needs []  - 0 Assessment of Community assistance (transportation, D/C planning, etc.) []  - 0 Additional  assistance / Altered mentation []  - 0 Support Surface(s) Assessment (bed, cushion, seat, etc.) INTERVENTIONS - Wound Cleansing / Measurement X - Simple Wound Cleansing - one wound 1 5 []  - 0 Complex Wound Cleansing -  multiple wounds X- 1 5 Wound Imaging (photographs - any number of wounds) []  - 0 Wound Tracing (instead of photographs) X- 1 5 Simple Wound Measurement - one wound []  - 0 Complex Wound Measurement - multiple wounds INTERVENTIONS - Wound Dressings X - Small Wound Dressing one or multiple wounds 1 10 []  - 0 Medium Wound Dressing one or multiple wounds []  - 0 Large Wound Dressing one or multiple wounds []  - 0 Application of Medications - topical []  - 0 Application of Medications - injection INTERVENTIONS - Miscellaneous []  - 0 External ear exam []  - 0 Specimen Collection (cultures, biopsies, blood, body fluids, etc.) []  - 0 Specimen(s) / Culture(s) sent or taken to Lab for analysis []  - 0 Patient Transfer (multiple staff / Nurse, adultHoyer Lift / Similar devices) []  - 0 Simple Staple / Suture removal (25 or less) []  - 0 Complex Staple / Suture removal (26 or more) []  - 0 Hypo / Hyperglycemic Management (close monitor of Blood Glucose) []  - 0 Ankle / Brachial Index (ABI) - do not check if billed separately X- 1 5 Vital Signs Has the patient been seen at the hospital within the last three years: Yes Total Score: 100 Level Of Care: New/Established - Level 3 Electronic Signature(s) Signed: 11/03/2020 5:53:43 PM By: Donna [name], Donna [name] Entered By: Donna [name], Donna [name] on 11/03/2020 16:41:32 -------------------------------------------------------------------------------- Encounter Discharge Information Details Patient Name: Date of Service: Donna [name], Donna R. 11/03/2020 4:00 PM Medical Record Number: [id number] Patient Account Number: [id number] Date of Birth/Sex: Treating RN: 03/05/1966 (55 y.o. Donna [name]) Donna [name] Primary Care Shevon Sian: Donna Dankerrawford, Donna Other Clinician: Referring  Donna [name]: Treating Eleanna Theilen/Extender: Donna Blazingobson, Donna [name], Donna [name]: 5 Encounter Discharge Information Items Discharge Condition: Stable Ambulatory Status: Ambulatory Discharge Destination: Home Transportation: Private Auto Accompanied By: self Schedule Follow-up Appointment: No Clinical Summary of Care: Electronic Signature(s) Signed: 11/03/2020 5:53:43 PM By: Donna [name], Donna [name] Entered By: Donna [name], Donna [name] on 11/03/2020 16:42:22 -------------------------------------------------------------------------------- Lower Extremity Assessment Details Patient Name: Date of Service: Donna [name], Donna R. 11/03/2020 4:00 PM Medical Record Number: [id number] Patient Account Number: [id number] Date of Birth/Sex: Treating RN: 07/15/1966 (55 y.o. Donna PickettF) [name], Donna KendallBobbi Primary Care Esteven Overfelt: Donna Dankerrawford, Donna Other Clinician: Referring Tonji Elliff: Treating Yaritza Leist/Extender: Donna Blazingobson, Donna [name], Donna [name]: 5 Edema Assessment Assessed: [Left: No] [Right: Yes] Edema: [Left: N] [Right: o] Calf Left: Right: Point of Measurement: 32 cm From Medial Instep 35 cm Ankle Left: Right: Point of Measurement: 12 cm From Medial Instep 22 cm Vascular Assessment Pulses: Dorsalis Pedis Palpable: [Right:Yes] Electronic Signature(s) Signed: 11/03/2020 5:43:09 PM By: Donna IbaBarnhart, Jodi Signed: 11/03/2020 5:53:43 PM By: Donna [name], Donna [name] Entered By: Donna IbaBarnhart, Jodi on 11/03/2020 16:14:29 -------------------------------------------------------------------------------- Multi Wound Chart Details Patient Name: Date of Service: Donna [name], Donna R. 11/03/2020 4:00 PM Medical Record Number: [id number] Patient Account Number: [id number] Date of Birth/Sex: Treating RN: 07/26/1966 (55 y.o. Donna [name]) Donna [name] Primary Care Mekhi Lascola: Donna Dankerrawford, Donna Other Clinician: Referring Jenella Craigie: Treating Shakeema Lippman/Extender: Donna Blazingobson, Donna [name], Donna [name]: 5 Vital  Signs Height(in): 66 Pulse(bpm): 83 Weight(lbs): 225 Blood Pressure(mmHg): 137/76 Body Mass Index(BMI): 36 Temperature(F): 98.9 Respiratory Rate(breaths/min): 16 Photos: [N/A:N/A] Right, Lateral Calcaneus N/A N/A Wound Location: Gradually Appeared N/A N/A Wounding Event: Diabetic Wound/Ulcer of the Lower N/A N/A Primary Etiology: Extremity Hypertension, Type II Diabetes, N/A N/A Comorbid History: Neuropathy 05/20/2020 N/A N/A Date Acquired: 5 N/A N/A [name] of [name]: Open N/A N/A Wound Status: 0x0x0 N/A N/A Measurements L x W x D (cm) 0 N/A N/A A (cm) : rea 0 N/A N/A Volume (cm) : 100.00% N/A  N/A % Reduction in A rea: 100.00% N/A N/A % Reduction in Volume: Grade 2 N/A N/A Classification: None Present N/A N/A Exudate A mount: Flat and Intact N/A N/A Wound Margin: None Present (0%) N/A N/A Granulation A mount: None Present (0%) N/A N/A Necrotic A mount: Fascia: No N/A N/A Exposed Structures: Fat Layer (Subcutaneous Tissue): No Tendon: No Muscle: No Joint: No Bone: No Large (67-100%) N/A N/A Epithelialization: [name] Notes Wound #1 (Calcaneus) Wound Laterality: Right, Lateral Cleanser Peri-Wound Care Topical Primary Dressing Secondary Dressing Secured With Compression Wrap Compression Stockings Add-Ons Notes foam bordered applied to healed area. Electronic Signature(s) Signed: 11/03/2020 5:46:09 PM By: Baltazar Najjar MD Signed: 11/03/2020 5:53:43 PM By: Donna Stall Entered By: Baltazar Najjar on 11/03/2020 17:37:59 -------------------------------------------------------------------------------- Multi-Disciplinary Care Plan Details Patient Name: Date of Service: Donna [name]. 11/03/2020 4:00 PM Medical Record Number: [id number] Patient Account Number: [id number] Date of Birth/Sex: Treating RN: 1966/03/25 (55 y.o. Donna Silence Primary Care Charisa Twitty: Donna [name] Other Clinician: Referring Albertus Chiarelli: Treating  Ariyannah Pauling/Extender: Donna [name] in [name]: 5 Active Inactive Electronic Signature(s) Signed: 11/03/2020 5:53:43 PM By: Donna Stall Entered By: Donna Stall on 11/03/2020 16:41:10 -------------------------------------------------------------------------------- Pain Assessment Details Patient Name: Date of Service: Donna [name], Donna [name] 11/03/2020 4:00 PM Medical Record Number: [id number] Patient Account Number: [id number] Date of Birth/Sex: Treating RN: [birth year] (55 y.o. Donna Silence Primary Care Malkia Nippert: Donna [name] Other Clinician: Referring Terra Aveni: Treating Axten Pascucci/Extender: Donna [name] in [name]: 5 Active Problems Location of Pain Severity and Description of Pain Patient Has Paino No Site Locations Pain Management and Medication Current Pain Management: Electronic Signature(s) Signed: 11/03/2020 5:43:09 PM By: Donna [name] Signed: 11/03/2020 5:53:43 PM By: Donna Stall Entered By: Donna [name] on 11/03/2020 16:10:42 -------------------------------------------------------------------------------- Patient/Caregiver Education Details Patient Name: Date of Service: Donna [name] 3/17/2022andnbsp4:00 PM Medical Record Number: [id number] Patient Account Number: [id number] Date of Birth/Gender: Treating RN: Oct 11, 1965 (55 y.o. Donna Silence Primary Care Physician: Donna [name] Other Clinician: Referring Physician: Treating Physician/Extender: Donna [name] in [name]: 5 Education Assessment Education Provided To: Patient Education Topics Provided Wound/Skin Impairment: Handouts: Skin Care Do's and Dont's Methods: Explain/Verbal Responses: Reinforcements needed Electronic Signature(s) Signed: 11/03/2020 5:53:43 PM By: Donna Stall Entered By: Donna Stall on 11/03/2020  16:02:32 -------------------------------------------------------------------------------- Wound Assessment Details Patient Name: Date of Service: Donna [name], Donna [name] 11/03/2020 4:00 PM Medical Record Number: [id number] Patient Account Number: [id number] Date of Birth/Sex: Treating RN: 1965/11/18 (55 y.o. Donna [name], Millard.Loa Primary Care Ishi Danser: Donna [name] Other Clinician: Referring Enda Santo: Treating Avilene Marrin/Extender: Donna [name] in [name]: 5 Wound Status Wound Number: 1 Primary Etiology: Diabetic Wound/Ulcer of the Lower Extremity Wound Location: Right, Lateral Calcaneus Wound Status: Open Wounding Event: Gradually Appeared Comorbid History: Hypertension, Type II Diabetes, Neuropathy Date Acquired: 05/20/2020 [name] Of [name]: 5 Clustered Wound: No Photos Photo Uploaded By: Karl Ito on 11/03/2020 16:55:21 Wound Measurements Length: (cm) Width: (cm) Depth: (cm) Area: (cm) Volume: (cm) 0 % Reduction in Area: 100% 0 % Reduction in Volume: 100% 0 Epithelialization: Large (67-100%) 0 0 Wound Description Classification: Grade 2 Wound Margin: Flat and Intact Exudate Amount: None Present Foul Odor After Cleansing: No Slough/Fibrino No Wound Bed Granulation Amount: None Present (0%) Exposed Structure Necrotic Amount: None Present (0%) Fascia Exposed: No Fat Layer (Subcutaneous Tissue) Exposed: No Tendon Exposed: No Muscle Exposed: No Joint Exposed: No Bone Exposed: No Electronic Signature(s) Signed: 11/03/2020 5:43:09 PM By: Donna [name] Signed: 11/03/2020 5:53:43 PM By: Donna Stall Entered By: Donna [name]  on 11/03/2020 16:13:49 -------------------------------------------------------------------------------- Vitals Details Patient Name: Date of Service: Donna [name], Donna [name] 11/03/2020 4:00 PM Medical Record Number: [id number] Patient Account Number: [id number] Date of Birth/Sex: Treating RN: 04/19/1966 (55 y.o.  Donna [name], Donna [name] Primary Care Yareliz Thorstenson: Donna [name] Other Clinician: Referring Banita Lehn: Treating Marte Celani/Extender: Donna [name] in [name]: 5 Vital Signs Time Taken: 16:08 Temperature (F): 98.9 Height (in): 66 Pulse (bpm): 83 Weight (lbs): 225 Respiratory Rate (breaths/min): 16 Body Mass Index (BMI): 36.3 Blood Pressure (mmHg): 137/76 Reference Range: 80 - 120 mg / dl Electronic Signature(s) Signed: 11/03/2020 5:43:09 PM By: Donna [name] Entered By: Donna [name] on 11/03/2020 16:10:35

## 2020-11-03 NOTE — Progress Notes (Signed)
KIRSTI, MCALPINE ([id number]) Visit Report for 11/03/2020 HPI Details Patient Name: Date of Service: Donna [name], Donna [name] 11/03/2020 4:00 PM Medical Record Number: [id number] Patient Account Number: [id number] Date of Birth/Sex: Treating RN: 1965-10-10 (55 y.o. 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 She tells me that since at least last summer she has had a progressive callused area that has opened closed but is never really healed. She has a history of calluses on her feet. She is a diabetic with neuropathy. She was referred to Dr. Allena Katz at [facility] who felt that this might be a large plantar wart she was treated with laser for 2 treatments and then subsequently with ammonium lactate none of this is apparently made any difference according to the patient. She has a very odd looking area with thick skin and callus and an almost crossed like area of open tissue. She also has erythema above this which she says is chronic. The other interesting part of her recent history is recurrent cellulitis on the right lower leg although this is separated from the area we are looking at. She has had trips to see infectious disease has been on Keflex for as long as 20 days. This is presumed to be strep. She saw Dr. Renold Don Past medical history includes type 2 diabetes, recurrent cellulitis of the right leg, right Achilles tendon repair a year ago for spontaneous rupture ABI in our clinic was 1.09 on the right 2/17; patient admitted the clinic last week had a thick callused area on the right lateral heel she is a diabetic with neuropathy. She had thick callus and across like area of open wound. I aggressively  debrided this area to remove most of the callus and gave her Hydrofera Blue and heel off loader, heel cup and she has been wearing this religiously trying to keep the pressure off the area. She comes in today with a cross-like area of injury has healed she has a new open area that I do not remember from last time. Still some dry flaking skin and callus but not nearly as much as last week at which time I had to use a scalpel to remove this. 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 She came in 2 weeks ago with a very thick hyperkeratotic callused area on her right lateral heel. There was a "crossed" shaped area of split tissue with an open wound at the bottom. This had previously been treated as a possible wart without improvement. I removed all of the callus when she first came in here. This is mostly epithelialized over still thick skin but nothing like it was. She has 1 small open area that has not healed she has been more religious about offloading the area 10/27/2020 upon evaluation today patient appears to be doing well with regard to her wound although she does still apparently appear to have something still open in the base of the wound. Fortunately there is no evidence of infection at this time which is great news and in general I feel like she is doing decently well though she has a lot of callus buildup she is using the offloading shoe for her heel. At home she does not always wear this she tells me. Generally she  is using flip-flops there. Of note her primary care provider did give her Keflex yesterday for cellulitis of this leg but has nothing to do with the heel location. 3/17; the patient's area actually looks a lot better. There is no open wound here. Much less callus. She was given a prescription for 40% urea cream I am not sure that really is required this week. She is going to require less friction on this area. I think she is going to need a different type of  shoe going forward Electronic Signature(s) Signed: 11/03/2020 5:46:09 PM By: Baltazar Najjar MD Entered By: Baltazar Najjar on 11/03/2020 17:39:15 -------------------------------------------------------------------------------- Physical Exam Details Patient Name: Date of Service: Donna [name] 11/03/2020 4:00 PM Medical Record Number: [id number] Patient Account Number: [id number] Date of Birth/Sex: Treating RN: February 15, 1966 (54 y.o. Donna [name] Primary Care Provider: Hillard Danker Other Clinician: Referring Provider: Treating Provider/Extender: Shara Blazing in Treatment: 5 Notes Wound exam; everything is closed here. The calluses much better. She does have a collinear crack in the heel but this does not open to the tissue. Electronic Signature(s) Signed: 11/03/2020 5:46:09 PM By: Baltazar Najjar MD Entered By: Baltazar Najjar on 11/03/2020 17:40:34 -------------------------------------------------------------------------------- Physician Orders Details Patient Name: Date of Service: Donna [name] 11/03/2020 4:00 PM Medical Record Number: [id number] Patient Account Number: [id number] Date of Birth/Sex: Treating RN: 1966/01/08 (55 y.o. Donna [name] Primary Care Provider: Hillard Danker Other Clinician: Referring Provider: Treating Provider/Extender: Shara Blazing in Treatment: 5 Verbal / Phone Orders: No Diagnosis Coding ICD-10 Coding Code Description E11.621 Type 2 diabetes mellitus with foot ulcer L97.418 Non-pressure chronic ulcer of right heel and midfoot with other specified severity E11.42 Type 2 diabetes mellitus with diabetic polyneuropathy Discharge From [facility] Services Discharge from Wound Care Center - call if any future wound care needs. Additional Orders / Instructions Other: - wear shoes with closed heeled shoe to prevent friction and shear. insole insert. Pad area at least 2  weeks. urea cream for callous area. Electronic Signature(s) Signed: 11/03/2020 5:46:09 PM By: Baltazar Najjar MD Signed: 11/03/2020 5:53:43 PM By: Shawn Stall Entered By: Shawn Stall on 11/03/2020 16:35:10 -------------------------------------------------------------------------------- Problem List Details Patient Name: Date of Service: Donna [name]. 11/03/2020 4:00 PM Medical Record Number: [id number] Patient Account Number: [id number] Date of Birth/Sex: Treating RN: 11-Aug-1966 (55 y.o. Donna [name], Donna [name] Primary Care Provider: Hillard Danker Other Clinician: Referring Provider: Treating Provider/Extender: Shara Blazing in Treatment: 5 Active Problems ICD-10 Encounter Code Description Active Date MDM Diagnosis E11.621 Type 2 diabetes mellitus with foot ulcer 09/29/2020 No Yes L97.418 Non-pressure chronic ulcer of right heel and midfoot with other specified 09/29/2020 No Yes severity E11.42 Type 2 diabetes mellitus with diabetic polyneuropathy 09/29/2020 No Yes Inactive Problems Resolved Problems Electronic Signature(s) Signed: 11/03/2020 5:46:09 PM By: Baltazar Najjar MD Entered By: Baltazar Najjar on 11/03/2020 17:37:53 -------------------------------------------------------------------------------- Progress Note Details Patient Name: Date of Service: Donna [name] 11/03/2020 4:00 PM Medical Record Number: [id number] Patient Account Number: [id number] Date of Birth/Sex: Treating RN: 11-Nov-1965 (55 y.o. 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 She tells me that since at least last summer she has had a progressive callused area that has opened  closed but is never really healed. She has a history of  calluses on her feet. She is a diabetic with neuropathy. She was referred to Dr. [facility] KatzPatel at [facility] who felt that this might be a large plantar wart she was treated with laser for 2 treatments and then subsequently with ammonium lactate none of this is apparently made any difference according to the patient. She has a very odd looking area with thick skin and callus and an almost crossed like area of open tissue. She also has erythema above this which she says is chronic. The other interesting part of her recent history is recurrent cellulitis on the right lower leg although this is separated from the area we are looking at. She has had trips to see infectious disease has been on Keflex for as long as 20 days. This is presumed to be strep. She saw Dr. Renold DonVu Past medical history includes type 2 diabetes, recurrent cellulitis of the right leg, right Achilles tendon repair a year ago for spontaneous rupture ABI in our clinic was 1.09 on the right 2/17; patient admitted the clinic last week had a thick callused area on the right lateral heel she is a diabetic with neuropathy. She had thick callus and across like area of open wound. I aggressively debrided this area to remove most of the callus and gave her Hydrofera Blue and heel off loader, heel cup and she has been wearing this religiously trying to keep the pressure off the area. She comes in today with a cross-like area of injury has healed she has a new open area that I do not remember from last time. Still some dry flaking skin and callus but not nearly as much as last week at which time I had to use a scalpel to remove this. 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 She came in 2 weeks ago with a very thick hyperkeratotic callused area on her right lateral heel. There was a "crossed" shaped area of split tissue with an open wound at the bottom. This had  previously been treated as a possible wart without improvement. I removed all of the callus when she first came in here. This is mostly epithelialized over still thick skin but nothing like it was. She has 1 small open area that has not healed she has been more religious about offloading the area 10/27/2020 upon evaluation today patient appears to be doing well with regard to her wound although she does still apparently appear to have something still open in the base of the wound. Fortunately there is no evidence of infection at this time which is great news and in general I feel like she is doing decently well though she has a lot of callus buildup she is using the offloading shoe for her heel. At home she does not always wear this she tells me. Generally she is using flip-flops there. Of note her primary care provider did give her Keflex yesterday for cellulitis of this leg but has nothing to do with the heel location. 3/17; the patient's area actually looks a lot better. There is no open wound here. Much less callus. She was given a prescription for 40% urea cream I am not sure that really is required this week. She is going to require less friction on this area. I think she is going to need a different type of shoe going forward Objective Constitutional Vitals Time Taken: 4:08 PM, Height: 66 in, Weight: 225 lbs, BMI: 36.3, Temperature: 98.9 F, Pulse: 83 bpm, Respiratory  Rate: 16 breaths/min, Blood Pressure: 137/76 mmHg. Integumentary (Hair, Skin) Wound #1 status is Open. Original cause of wound was Gradually Appeared. The date acquired was: 05/20/2020. The wound has been in treatment 5 weeks. The wound is located on the Right,Lateral Calcaneus. The wound measures 0cm length x 0cm width x 0cm depth; 0cm^2 area and 0cm^3 volume. There is a none present amount of drainage noted. The wound margin is flat and intact. There is no granulation within the wound bed. There is no necrotic tissue within  the wound bed. Assessment Active Problems ICD-10 Type 2 diabetes mellitus with foot ulcer Non-pressure chronic ulcer of right heel and midfoot with other specified severity Type 2 diabetes mellitus with diabetic polyneuropathy Plan Discharge From [facility] Services: Discharge from Wound Care Center - call if any future wound care needs. Additional Orders / Instructions: Other: - wear shoes with closed heeled shoe to prevent friction and shear. insole insert. Pad area at least 2 weeks. urea cream for callous area. 1. This was a very odd area. Initially layers in layers of thick callus that somebody thought was had and actually a large plantar wart. I removed all of this with picks up pickups and scalpel which eventually allowed the open areas to close. 2. I look at this is a friction area in her shoes and I have suggested alternative footwear to what she has been using continuing to pad this area. There is not enough callus here currently to justify 40% urea 3. Insoles in her shoes 4. I think she can be discharged Electronic Signature(s) Signed: 11/03/2020 5:46:09 PM By: Baltazar Najjar MD Entered By: Baltazar Najjar on 11/03/2020 17:42:00 -------------------------------------------------------------------------------- SuperBill Details Patient Name: Date of Service: Donna [name] 11/03/2020 Medical Record Number: [id number] Patient Account Number: [id number] Date of Birth/Sex: Treating RN: 01-15-1966 (55 y.o. Donna [name] Primary Care Provider: Hillard Danker Other Clinician: Referring Provider: Treating Provider/Extender: Shara Blazing in Treatment: 5 Diagnosis Coding ICD-10 Codes Code Description [id number] Type 2 diabetes mellitus with foot ulcer L97.418 Non-pressure chronic ulcer of right heel and midfoot with other specified severity E11.42 Type 2 diabetes mellitus with diabetic polyneuropathy Facility Procedures CPT4 Code:  [id number] Description: 99213 - WOUND CARE VISIT-LEV 3 EST PT Modifier: Quantity: 1 Physician Procedures : CPT4 Code Description Modifier [id number] [phone] - WC PHYS LEVEL 2 - EST PT ICD-10 Diagnosis Description L97.418 Non-pressure chronic ulcer of right heel and midfoot with other specified severity E11.621 Type 2 diabetes mellitus with foot ulcer E11.42 Type  2 diabetes mellitus with diabetic polyneuropathy Quantity: 1 Electronic Signature(s) Signed: 11/03/2020 5:46:09 PM By: Baltazar Najjar MD Entered By: Baltazar Najjar on 11/03/2020 17:42:27

## 2020-11-13 ENCOUNTER — Ambulatory Visit
Admission: RE | Admit: 2020-11-13 | Discharge: 2020-11-13 | Disposition: A | Discharge status: Home / Self Care | Payer: 59 | Source: Ambulatory Visit | Attending: Internal Medicine | Admitting: Internal Medicine

## 2020-11-13 ENCOUNTER — Other Ambulatory Visit: Payer: Self-pay

## 2020-11-13 DIAGNOSIS — Z0389 Encounter for observation for other suspected diseases and conditions ruled out: Secondary | ICD-10-CM | POA: Diagnosis not present

## 2020-11-13 DIAGNOSIS — L03115 Cellulitis of right lower limb: Secondary | ICD-10-CM

## 2020-11-14 ENCOUNTER — Encounter: Payer: Self-pay | Admitting: Internal Medicine

## 2020-11-19 ENCOUNTER — Other Ambulatory Visit (HOSPITAL_COMMUNITY): Payer: Self-pay

## 2020-11-19 MED FILL — Metformin HCl Tab 1000 MG: ORAL | 90 days supply | Qty: 180 | Fill #0 | Status: CN

## 2020-11-20 ENCOUNTER — Other Ambulatory Visit (HOSPITAL_COMMUNITY): Payer: Self-pay

## 2020-11-21 ENCOUNTER — Other Ambulatory Visit (HOSPITAL_COMMUNITY): Payer: Self-pay

## 2020-11-29 ENCOUNTER — Other Ambulatory Visit (HOSPITAL_COMMUNITY): Payer: Self-pay

## 2020-12-01 ENCOUNTER — Other Ambulatory Visit (HOSPITAL_COMMUNITY): Payer: Self-pay

## 2020-12-02 ENCOUNTER — Other Ambulatory Visit (HOSPITAL_COMMUNITY): Payer: Self-pay

## 2020-12-02 MED FILL — Insulin Degludec Soln Pen-Injector 100 Unit/ML: SUBCUTANEOUS | 60 days supply | Qty: 30 | Fill #0 | Status: AC

## 2020-12-02 MED FILL — Metformin HCl Tab 1000 MG: ORAL | 90 days supply | Qty: 180 | Fill #0 | Status: AC

## 2020-12-07 IMAGING — MR MR ANKLE*R* W/O CM
4 of 5 series · 19 of 40 positions shown · non-contrast
Comparison: Foot radiographs 07/02/2019

CLINICAL DATA: Ruptured Achilles tendon

EXAM:
MRI OF THE RIGHT ANKLE WITHOUT CONTRAST
TECHNIQUE: Multiplanar, multisequence MR imaging of the ankle was performed. No
intravenous contrast was administered.

[Series 2: T1 · sagittal · 4.0mm · 0.39mm/px · 3 of 18 slices shown]
[im 1/18]
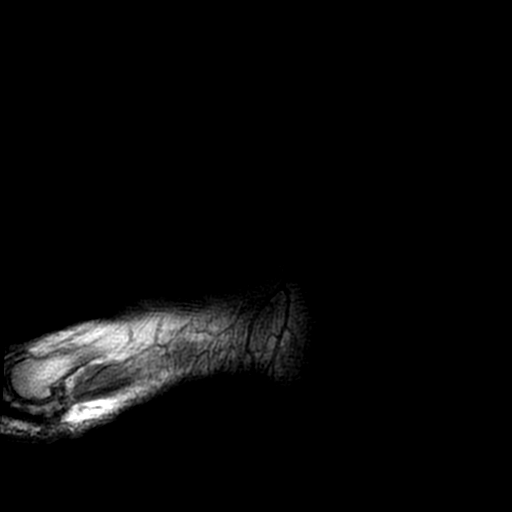
[im 12/18]
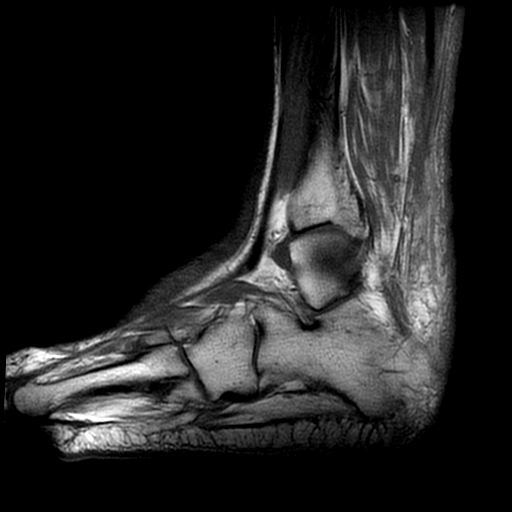
[im 18/18]
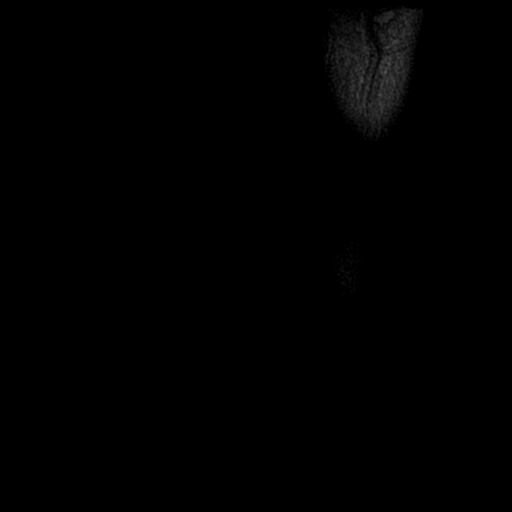

[Series 4: T2 fat-sat · coronal · 3.0mm · 0.35mm/px · 3 of 42 slices shown (1 of 2)]
[im 5/42]
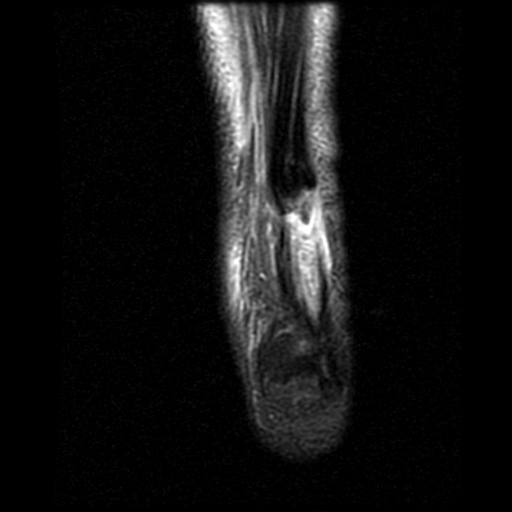
[im 21/42]
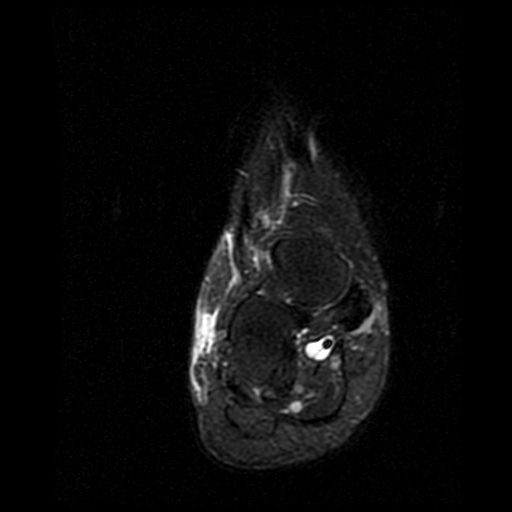
[im 37/42]
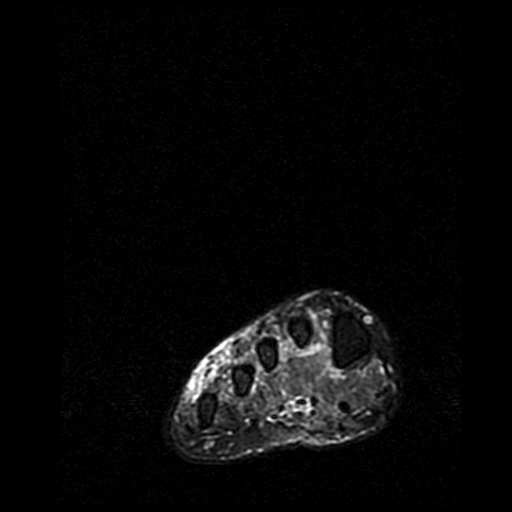

[Series 5: PD fat-sat · coronal · 3.0mm · 0.35mm/px · 10 of 42 slices shown]
[im 1/42]
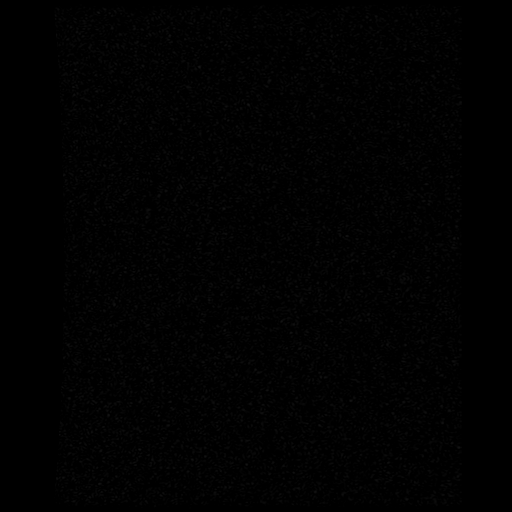
[im 5/42]
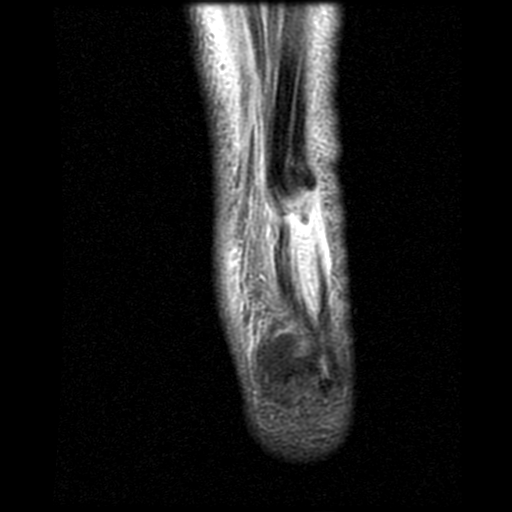
[im 9/42]
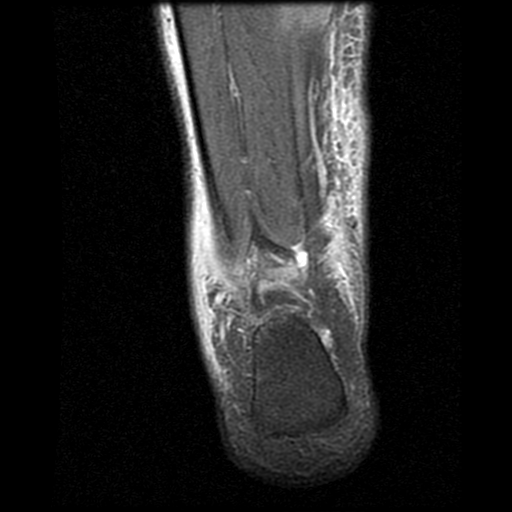
[im 13/42]
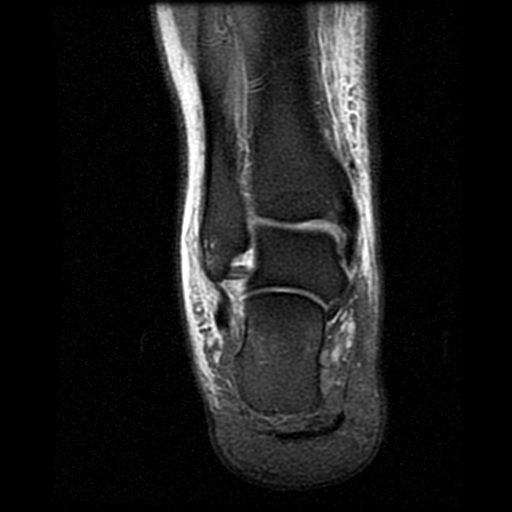
[im 17/42]
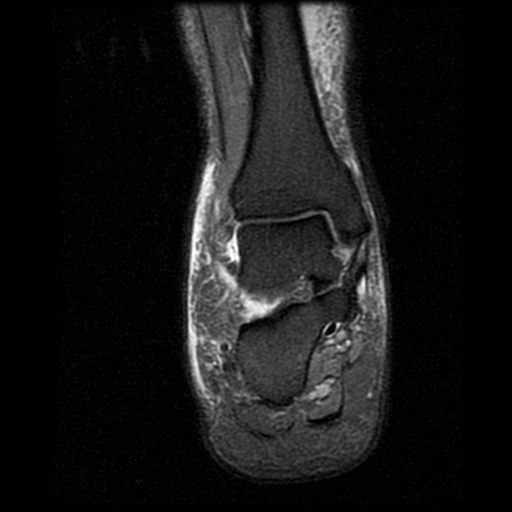
[im 21/42]
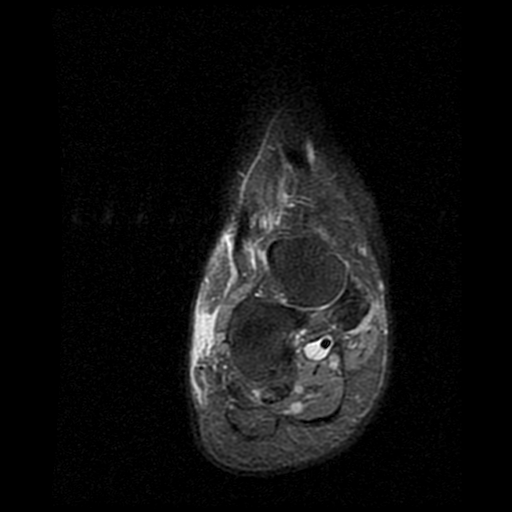
[im 25/42]
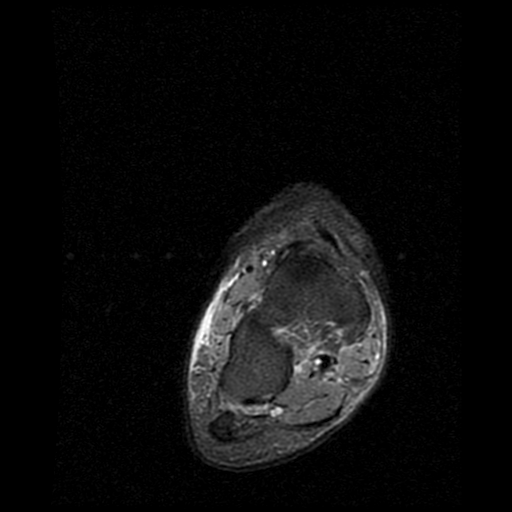
[im 29/42]
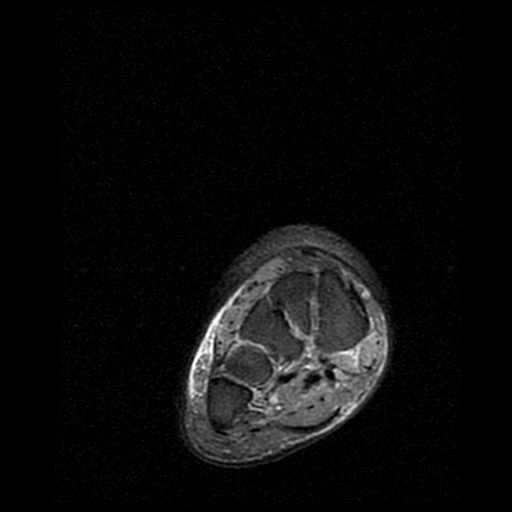
[im 33/42]
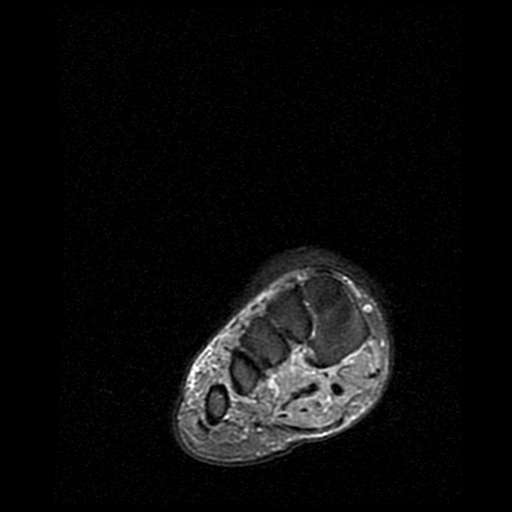
[im 37/42]
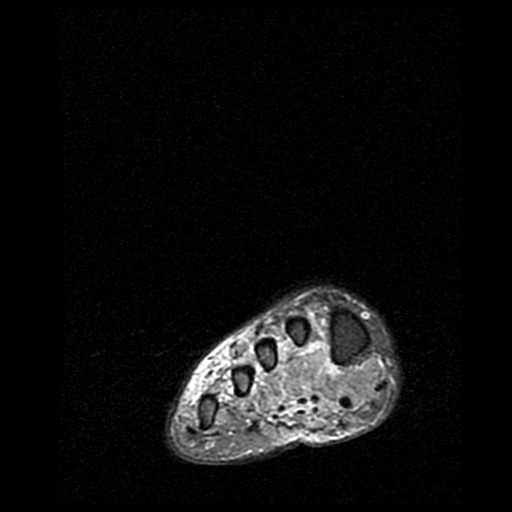

[Series 6: T2 fat-sat · axial · 3.0mm · 0.35mm/px · z∈[-65,+47]mm · 3 of 38 slices shown (2 of 2)]
[im 5/38]
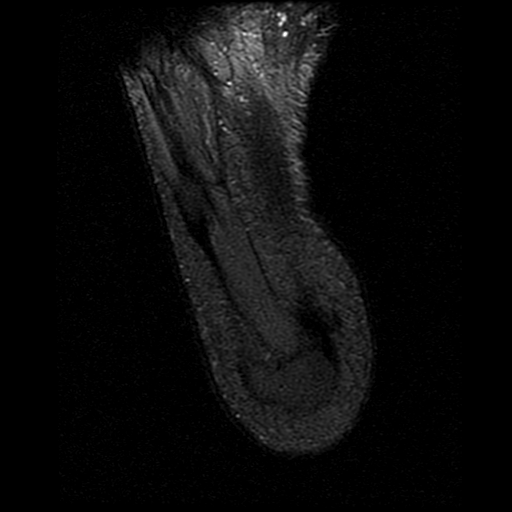
[im 21/38]
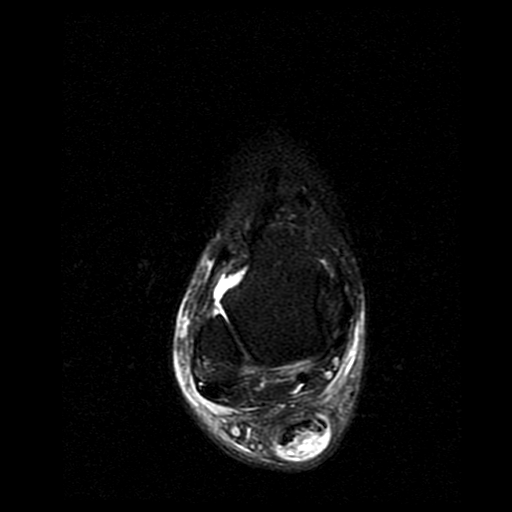
[im 33/38]
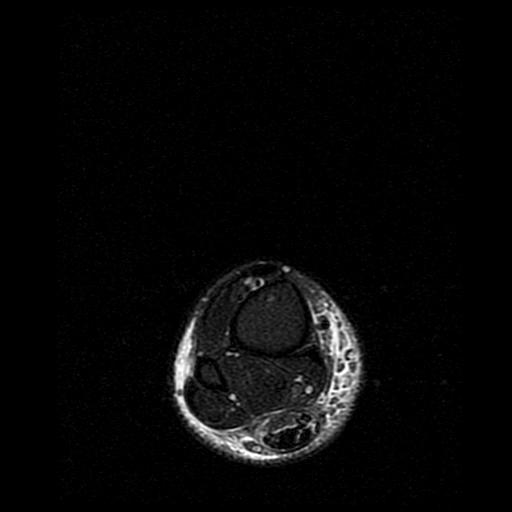

[19 of 40 positions shown; findings below may reference images not displayed]

FINDINGS: TENDONS

Peroneal: Unremarkable

Posteromedial: Mild distal tibialis posterior tenosynovitis. Flexor
hallucis longus tenosynovitis just proximal to the knot of Henry.

Anterior: Unremarkable

Achilles: Torn but not completely ruptured Achilles tendon, with
continuity of about 20% of the anterior portion of the Achilles
tendon down to the calcaneus, and rupture of the posterior 80% of
the tendon with the blunted proximal tendon margin about 5.6 cm
proximal to the distal attachment site.

Plantar Fascia: Unremarkable

LIGAMENTS

Lateral: Unremarkable

Medial: Unremarkable

CARTILAGE

Ankle Joint: Unremarkable

Subtalar Joints/Sinus Tarsi: Unremarkable

Bones: Plantar calcaneal spur. Subtle marrow edema signal inferiorly
in the tip of the fibula, significance uncertain.

Other: No supplemental non-categorized findings.
IMPRESSION: 1. Torn but not completely ruptured Achilles tendon. About 20% of
the anterior portion of the tendon is continuous down to the
calcaneus.
2. Mild distal tibialis posterior tenosynovitis.
3. Flexor hallucis longus tenosynovitis just proximal to the knot of
[place]ig[place].
4. Plantar calcaneal spur.
5. Subtle marrow edema inferiorly in the tip of the fibula,
significance uncertain.

## 2020-12-15 ENCOUNTER — Other Ambulatory Visit (HOSPITAL_BASED_OUTPATIENT_CLINIC_OR_DEPARTMENT_OTHER): Payer: Self-pay

## 2020-12-15 ENCOUNTER — Ambulatory Visit: Payer: 59 | Attending: Internal Medicine

## 2020-12-15 ENCOUNTER — Other Ambulatory Visit: Payer: Self-pay

## 2020-12-15 DIAGNOSIS — Z23 Encounter for immunization: Secondary | ICD-10-CM

## 2020-12-15 MED ORDER — PFIZER-BIONT COVID-19 VAC-TRIS 30 MCG/0.3ML IM SUSP
INTRAMUSCULAR | 0 refills | Status: DC
  Filled 2020-12-15: qty 0.3, 1d supply, fill #0

## 2020-12-15 NOTE — Progress Notes (Signed)
   Covid-19 Vaccination Clinic  Name:  Donna [name]    MRN: [id number] DOB: [birth year]  12/15/2020  [name] was observed post Covid-19 immunization for 15 minutes without incident. She was provided with Vaccine Information Sheet and instruction to access the V-Safe system.   [name] was instructed to call 911 with any severe reactions post vaccine: Marland Kitchen Difficulty breathing  . Swelling of face and throat  . A fast heartbeat  . A bad rash all over body  . Dizziness and weakness   Immunizations Administered    Name Date Dose VIS Date Route   PFIZER Comrnaty(Gray TOP) Covid-19 Vaccine 12/15/2020  3:45 PM 0.3 mL 07/28/2020 Intramuscular   Manufacturer: [employer], [employer]   Lot: [id number]   NDC: [phone]

## 2020-12-16 ENCOUNTER — Other Ambulatory Visit (HOSPITAL_BASED_OUTPATIENT_CLINIC_OR_DEPARTMENT_OTHER): Payer: Self-pay

## 2021-01-02 ENCOUNTER — Other Ambulatory Visit (HOSPITAL_COMMUNITY): Payer: Self-pay

## 2021-01-02 ENCOUNTER — Other Ambulatory Visit: Payer: Self-pay | Admitting: Internal Medicine

## 2021-01-03 ENCOUNTER — Other Ambulatory Visit (HOSPITAL_COMMUNITY): Payer: Self-pay

## 2021-01-03 MED ORDER — SERTRALINE HCL 100 MG PO TABS
ORAL_TABLET | Freq: Every day | ORAL | 0 refills | Status: DC
  Filled 2021-01-03: qty 90, 90d supply, fill #0

## 2021-01-03 MED ORDER — TRESIBA FLEXTOUCH 100 UNIT/ML ~~LOC~~ SOPN
PEN_INJECTOR | SUBCUTANEOUS | 11 refills | Status: DC
  Filled 2021-01-03: qty 30, fill #0
  Filled 2021-01-05: qty 15, 30d supply, fill #0

## 2021-01-05 ENCOUNTER — Other Ambulatory Visit: Payer: Self-pay | Admitting: Internal Medicine

## 2021-01-05 ENCOUNTER — Encounter: Payer: Self-pay | Admitting: Internal Medicine

## 2021-01-05 ENCOUNTER — Other Ambulatory Visit (HOSPITAL_COMMUNITY): Payer: Self-pay

## 2021-01-06 ENCOUNTER — Other Ambulatory Visit (HOSPITAL_COMMUNITY): Payer: Self-pay

## 2021-01-06 MED ORDER — TRESIBA FLEXTOUCH 200 UNIT/ML ~~LOC~~ SOPN
100.0000 [IU] | PEN_INJECTOR | Freq: Every day | SUBCUTANEOUS | 2 refills | Status: DC
  Filled 2021-01-06: qty 15, 30d supply, fill #0
  Filled 2021-02-03: qty 15, 30d supply, fill #1
  Filled 2021-03-09: qty 15, 30d supply, fill #2

## 2021-01-06 NOTE — Telephone Encounter (Signed)
Patient calling, requesting a call back regarding her insulin

## 2021-01-10 ENCOUNTER — Other Ambulatory Visit (HOSPITAL_COMMUNITY): Payer: Self-pay

## 2021-01-10 MED FILL — Glucose Blood Test Strip: 50 days supply | Qty: 100 | Fill #0 | Status: CN

## 2021-01-10 MED FILL — Atorvastatin Calcium Tab 80 MG (Base Equivalent): ORAL | 90 days supply | Qty: 90 | Fill #0 | Status: CN

## 2021-01-18 ENCOUNTER — Other Ambulatory Visit (HOSPITAL_COMMUNITY): Payer: Self-pay

## 2021-01-20 ENCOUNTER — Other Ambulatory Visit (HOSPITAL_COMMUNITY): Payer: Self-pay

## 2021-01-20 MED FILL — Atorvastatin Calcium Tab 80 MG (Base Equivalent): ORAL | 90 days supply | Qty: 90 | Fill #0 | Status: AC

## 2021-01-20 MED FILL — Glucose Blood Test Strip: 50 days supply | Qty: 100 | Fill #0 | Status: AC

## 2021-02-01 DIAGNOSIS — H5213 Myopia, bilateral: Secondary | ICD-10-CM | POA: Diagnosis not present

## 2021-02-03 ENCOUNTER — Other Ambulatory Visit: Payer: Self-pay | Admitting: Internal Medicine

## 2021-02-03 ENCOUNTER — Other Ambulatory Visit (HOSPITAL_COMMUNITY): Payer: Self-pay

## 2021-02-03 MED ORDER — LISINOPRIL-HYDROCHLOROTHIAZIDE 20-25 MG PO TABS
1.0000 | ORAL_TABLET | Freq: Every day | ORAL | 1 refills | Status: DC
  Filled 2021-02-03: qty 90, 90d supply, fill #0
  Filled 2021-04-27: qty 90, 90d supply, fill #1

## 2021-02-03 MED FILL — Gabapentin Cap 300 MG: ORAL | 90 days supply | Qty: 360 | Fill #0 | Status: AC

## 2021-02-08 ENCOUNTER — Other Ambulatory Visit (HOSPITAL_COMMUNITY): Payer: Self-pay

## 2021-02-08 ENCOUNTER — Other Ambulatory Visit: Payer: Self-pay | Admitting: Internal Medicine

## 2021-02-09 ENCOUNTER — Other Ambulatory Visit (HOSPITAL_COMMUNITY): Payer: Self-pay

## 2021-02-09 MED ORDER — CARESTART COVID-19 HOME TEST VI KIT
PACK | 0 refills | Status: DC
  Filled 2021-02-09: qty 4, 4d supply, fill #0

## 2021-02-09 MED ORDER — JARDIANCE 25 MG PO TABS
25.0000 mg | ORAL_TABLET | Freq: Every day | ORAL | 0 refills | Status: DC
  Filled 2021-02-09: qty 30, 30d supply, fill #0

## 2021-02-27 ENCOUNTER — Encounter: Payer: Self-pay | Admitting: Internal Medicine

## 2021-02-27 ENCOUNTER — Other Ambulatory Visit (HOSPITAL_COMMUNITY): Payer: Self-pay

## 2021-02-27 ENCOUNTER — Other Ambulatory Visit: Payer: Self-pay

## 2021-02-27 ENCOUNTER — Ambulatory Visit: Payer: 59 | Admitting: Internal Medicine

## 2021-02-27 VITALS — BP 122/80 | HR 85 | Temp 98.0°F | Resp 18 | Ht 66.0 in | Wt 231.2 lb

## 2021-02-27 DIAGNOSIS — E1165 Type 2 diabetes mellitus with hyperglycemia: Secondary | ICD-10-CM | POA: Diagnosis not present

## 2021-02-27 DIAGNOSIS — H60333 Swimmer's ear, bilateral: Secondary | ICD-10-CM

## 2021-02-27 DIAGNOSIS — IMO0002 Reserved for concepts with insufficient information to code with codable children: Secondary | ICD-10-CM

## 2021-02-27 DIAGNOSIS — E1149 Type 2 diabetes mellitus with other diabetic neurological complication: Secondary | ICD-10-CM

## 2021-02-27 DIAGNOSIS — K121 Other forms of stomatitis: Secondary | ICD-10-CM | POA: Diagnosis not present

## 2021-02-27 LAB — HEMOGLOBIN A1C: Hgb A1c MFr Bld: 8.6 % — ABNORMAL HIGH (ref 4.6–6.5)

## 2021-02-27 MED ORDER — NEOMYCIN-POLYMYXIN-HC 1 % OT SOLN
3.0000 [drp] | Freq: Three times a day (TID) | OTIC | 0 refills | Status: DC
  Filled 2021-02-27: qty 10, 22d supply, fill #0

## 2021-02-27 MED ORDER — SERTRALINE HCL 100 MG PO TABS
ORAL_TABLET | Freq: Every day | ORAL | 3 refills | Status: DC
  Filled 2021-02-27: qty 90, fill #0
  Filled 2021-04-06: qty 90, 90d supply, fill #0
  Filled 2021-07-12: qty 90, 90d supply, fill #1
  Filled 2021-10-16: qty 90, 90d supply, fill #2
  Filled 2022-01-26: qty 90, 90d supply, fill #3

## 2021-02-27 MED ORDER — METFORMIN HCL 1000 MG PO TABS
ORAL_TABLET | Freq: Two times a day (BID) | ORAL | 3 refills | Status: DC
  Filled 2021-02-27: qty 180, 90d supply, fill #0
  Filled 2021-06-12: qty 180, 90d supply, fill #1
  Filled 2021-09-06: qty 180, 90d supply, fill #2
  Filled 2021-12-13: qty 180, 90d supply, fill #3

## 2021-02-27 MED ORDER — TRIAMCINOLONE ACETONIDE 0.1 % MT PSTE
1.0000 "application " | PASTE | Freq: Two times a day (BID) | OROMUCOSAL | 12 refills | Status: DC
  Filled 2021-02-27: qty 5, 3d supply, fill #0

## 2021-02-27 NOTE — Patient Instructions (Signed)
We have sent in the ear drops to use 3 drops each ear 3 times a day for 3-5 days.  We have sent in the triamcinolone to use in the mouth twice a day.

## 2021-02-27 NOTE — Progress Notes (Signed)
   Subjective:   Patient ID: Donna [name], female    DOB: [birth year], 55 y.o.   MRN: [id number]  HPI The patient is a 55 YO female coming in for concerns about ears (stopped up for last week or so, denies fevers or chills or sinus drainage or cough, did go swimming week before symptoms) and sore in mouth (there for a month or so, has diabetes which is under better control recently, present for a month and seems to keep biting it on accident maybe) and diabetes (overdue for HgA1c check, had change of insulins due to insurance coverage, also taking metformin and jardiance, she is not checking morning sugars often but when she does they are higher around 200+).   Review of Systems  Constitutional: Negative.   HENT:  Positive for dental problem and ear pain.   Eyes: Negative.   Respiratory:  Negative for cough, chest tightness and shortness of breath.   Cardiovascular:  Negative for chest pain, palpitations and leg swelling.  Gastrointestinal:  Negative for abdominal distention, abdominal pain, constipation, diarrhea, nausea and vomiting.  Musculoskeletal: Negative.   Skin: Negative.   Neurological: Negative.   Psychiatric/Behavioral: Negative.     Objective:  Physical Exam Constitutional:      Appearance: She is well-developed.  HENT:     Head: Normocephalic and atraumatic.     Ears:     Comments:  right ear canal impacted with copious hard wax, examination post ear lavage canal is clear and no bleeding or complications noted. Bilateral TM bulging with cloudy fluid.     Mouth/Throat:     Comments: There is a sore on the left molar region bottom and the swelling is causing the gum to encroach on the molar region, no signs of infection or abscess Cardiovascular:     Rate and Rhythm: Normal rate and regular rhythm.  Pulmonary:     Effort: Pulmonary effort is normal. No respiratory distress.     Breath sounds: Normal breath sounds. No wheezing or rales.  Abdominal:     General: Bowel  sounds are normal. There is no distension.     Palpations: Abdomen is soft.     Tenderness: There is no abdominal tenderness. There is no rebound.  Musculoskeletal:     Cervical back: Normal range of motion.  Skin:    General: Skin is warm and dry.  Neurological:     Mental Status: She is alert and oriented to person, place, and time.     Coordination: Coordination normal.    Vitals:   02/27/21 1517  BP: 122/80  Pulse: 85  Resp: 18  Temp: 98 F (36.7 C)  TempSrc: Oral  SpO2: 97%  Weight: 231 lb 3.2 oz (104.9 kg)  Height: 5\' 6"  (1.676 m)    This visit occurred during the SARS-CoV-2 public health emergency.  Safety protocols were in place, including screening questions prior to the visit, additional usage of staff PPE, and extensive cleaning of exam room while observing appropriate contact time as indicated for disinfecting solutions.   Assessment & Plan:

## 2021-02-28 DIAGNOSIS — H6093 Unspecified otitis externa, bilateral: Secondary | ICD-10-CM | POA: Insufficient documentation

## 2021-02-28 DIAGNOSIS — K121 Other forms of stomatitis: Secondary | ICD-10-CM | POA: Insufficient documentation

## 2021-02-28 NOTE — Assessment & Plan Note (Signed)
Checking HgA1c, last was controlled but morning sugar readings are higher than goal. Taking tresiba 100 units daily, metformin and jardiance. Adjust as needed.

## 2021-02-28 NOTE — Assessment & Plan Note (Signed)
Rx triamcinolone dental ointment to use to help reduce swelling so she will stop biting on this area while chewing. Advised to avoid chewing on that side if possible to let this heal.

## 2021-02-28 NOTE — Assessment & Plan Note (Signed)
Rx cortisporin 3 drops TID for 3-5 days both ears to clear.

## 2021-03-10 ENCOUNTER — Other Ambulatory Visit (HOSPITAL_COMMUNITY): Payer: Self-pay

## 2021-03-14 ENCOUNTER — Other Ambulatory Visit: Payer: Self-pay | Admitting: Internal Medicine

## 2021-03-14 ENCOUNTER — Other Ambulatory Visit (HOSPITAL_COMMUNITY): Payer: Self-pay

## 2021-03-14 MED ORDER — JARDIANCE 25 MG PO TABS
25.0000 mg | ORAL_TABLET | Freq: Every day | ORAL | 0 refills | Status: DC
  Filled 2021-03-14: qty 90, 90d supply, fill #0

## 2021-03-15 ENCOUNTER — Encounter: Payer: Self-pay | Admitting: Internal Medicine

## 2021-03-16 ENCOUNTER — Other Ambulatory Visit: Payer: Self-pay

## 2021-03-16 ENCOUNTER — Other Ambulatory Visit (HOSPITAL_COMMUNITY): Payer: Self-pay

## 2021-03-16 MED ORDER — FREESTYLE UNISTICK II LANCETS MISC
0 refills | Status: DC
Start: 2021-03-16 — End: 2022-06-02
  Filled 2021-03-16: qty 100, 50d supply, fill #0

## 2021-03-23 ENCOUNTER — Encounter: Payer: Self-pay | Admitting: Internal Medicine

## 2021-04-06 ENCOUNTER — Other Ambulatory Visit: Payer: Self-pay | Admitting: Internal Medicine

## 2021-04-06 ENCOUNTER — Other Ambulatory Visit (HOSPITAL_COMMUNITY): Payer: Self-pay

## 2021-04-08 ENCOUNTER — Other Ambulatory Visit (HOSPITAL_COMMUNITY): Payer: Self-pay

## 2021-04-10 ENCOUNTER — Other Ambulatory Visit (HOSPITAL_COMMUNITY): Payer: Self-pay

## 2021-04-10 ENCOUNTER — Other Ambulatory Visit: Payer: Self-pay | Admitting: Internal Medicine

## 2021-04-10 MED ORDER — TRESIBA FLEXTOUCH 200 UNIT/ML ~~LOC~~ SOPN
110.0000 [IU] | PEN_INJECTOR | Freq: Every day | SUBCUTANEOUS | 2 refills | Status: DC
  Filled 2021-04-10: qty 15, 27d supply, fill #0
  Filled 2021-05-09: qty 15, 27d supply, fill #1
  Filled 2021-06-12: qty 15, 27d supply, fill #2

## 2021-04-11 ENCOUNTER — Other Ambulatory Visit (HOSPITAL_COMMUNITY): Payer: Self-pay

## 2021-04-17 ENCOUNTER — Other Ambulatory Visit (HOSPITAL_COMMUNITY): Payer: Self-pay

## 2021-04-27 ENCOUNTER — Other Ambulatory Visit (HOSPITAL_COMMUNITY): Payer: Self-pay

## 2021-04-27 ENCOUNTER — Other Ambulatory Visit: Payer: Self-pay | Admitting: Internal Medicine

## 2021-04-28 ENCOUNTER — Other Ambulatory Visit (HOSPITAL_COMMUNITY): Payer: Self-pay

## 2021-04-28 MED ORDER — ATORVASTATIN CALCIUM 80 MG PO TABS
ORAL_TABLET | Freq: Every day | ORAL | 3 refills | Status: DC
  Filled 2021-04-28: qty 90, 90d supply, fill #0
  Filled 2021-08-07: qty 90, 90d supply, fill #1
  Filled 2021-11-17: qty 90, 90d supply, fill #2
  Filled 2022-03-14: qty 90, 90d supply, fill #3

## 2021-04-28 MED ORDER — GABAPENTIN 300 MG PO CAPS
ORAL_CAPSULE | Freq: Four times a day (QID) | ORAL | 1 refills | Status: DC
  Filled 2021-04-28: qty 360, 90d supply, fill #0
  Filled 2021-08-07: qty 360, 90d supply, fill #1

## 2021-05-02 ENCOUNTER — Other Ambulatory Visit: Payer: Self-pay

## 2021-05-02 ENCOUNTER — Encounter: Payer: Self-pay | Admitting: Internal Medicine

## 2021-05-02 ENCOUNTER — Other Ambulatory Visit (HOSPITAL_COMMUNITY): Payer: Self-pay

## 2021-05-02 ENCOUNTER — Ambulatory Visit: Payer: 59 | Admitting: Internal Medicine

## 2021-05-02 VITALS — BP 124/78 | HR 82 | Temp 98.3°F | Resp 18 | Ht 66.0 in | Wt 231.6 lb

## 2021-05-02 DIAGNOSIS — K121 Other forms of stomatitis: Secondary | ICD-10-CM

## 2021-05-02 MED ORDER — INSULIN PEN NEEDLE 31G X 5 MM MISC
3 refills | Status: DC
  Filled 2021-05-02: qty 100, 90d supply, fill #0

## 2021-05-02 NOTE — Patient Instructions (Signed)
We will get you in with the dermatologist to make sure the sore is just a canker sore.

## 2021-05-02 NOTE — Assessment & Plan Note (Signed)
Referral to dermatology to assess need for biopsy. Does not appear cancerous. Does appear to be aphthous ulcer with prolonged healing.

## 2021-05-02 NOTE — Progress Notes (Signed)
   Subjective:   Patient ID: Donna [name], female    DOB: Sep 12, 1965, 55 y.o.   MRN: [id number]  HPI The patient is a 55 YO female coming in for sore in mouth. Started in June. Seen in July and given triamcinolone ointment to use in the mouth which has not helped. Denies biting mouth. Denies drainage, cavities. Gets regular dental care. Does have diabetes. Not changing in size.  Review of Systems  Constitutional: Negative.   HENT: Negative.    Eyes: Negative.   Respiratory:  Negative for cough, chest tightness and shortness of breath.   Cardiovascular:  Negative for chest pain, palpitations and leg swelling.  Gastrointestinal:  Negative for abdominal distention, abdominal pain, constipation, diarrhea, nausea and vomiting.  Musculoskeletal: Negative.   Skin:  Positive for wound.  Neurological: Negative.   Psychiatric/Behavioral: Negative.     Objective:  Physical Exam Constitutional:      Appearance: She is well-developed.  HENT:     Head: Normocephalic and atraumatic.     Mouth/Throat:     Comments: Ulcer left mouth posterior last molar without color change or central necrosis. There is no white plaque or lesion in the mouth. No tongue lesion.  Cardiovascular:     Rate and Rhythm: Normal rate and regular rhythm.  Pulmonary:     Effort: Pulmonary effort is normal. No respiratory distress.     Breath sounds: Normal breath sounds. No wheezing or rales.  Abdominal:     General: Bowel sounds are normal. There is no distension.     Palpations: Abdomen is soft.     Tenderness: There is no abdominal tenderness. There is no rebound.  Musculoskeletal:     Cervical back: Normal range of motion.  Skin:    General: Skin is warm and dry.  Neurological:     Mental Status: She is alert and oriented to person, place, and time.     Coordination: Coordination normal.    Vitals:   05/02/21 1413  BP: 124/78  Pulse: 82  Resp: 18  Temp: 98.3 F (36.8 C)  TempSrc: Oral  SpO2: 95%   Weight: 231 lb 9.6 oz (105.1 kg)  Height: 5\' 6"  (1.676 m)    This visit occurred during the SARS-CoV-2 public health emergency.  Safety protocols were in place, including screening questions prior to the visit, additional usage of staff PPE, and extensive cleaning of exam room while observing appropriate contact time as indicated for disinfecting solutions.   Assessment & Plan:

## 2021-05-03 ENCOUNTER — Other Ambulatory Visit (HOSPITAL_COMMUNITY): Payer: Self-pay

## 2021-05-08 ENCOUNTER — Telehealth: Payer: Self-pay | Admitting: Dermatology

## 2021-05-08 NOTE — Telephone Encounter (Signed)
Notes documented in referral and routed it back to referring office. 

## 2021-05-08 NOTE — Telephone Encounter (Signed)
Patient is calling for a referral appointment from Hillard Danker, M.D. for a lesion inside her mouth that she has had for 3 to 4 months.  I explained that we do not treat the inside of the mouth and that she would need to be seen by a dentist or an oral surgeon.  I told her that we would send the referral back to Dr. Okey Dupre.

## 2021-05-09 ENCOUNTER — Other Ambulatory Visit (HOSPITAL_COMMUNITY): Payer: Self-pay

## 2021-05-22 ENCOUNTER — Ambulatory Visit: Payer: 59

## 2021-06-12 ENCOUNTER — Other Ambulatory Visit: Payer: Self-pay | Admitting: Internal Medicine

## 2021-06-12 ENCOUNTER — Other Ambulatory Visit (HOSPITAL_COMMUNITY): Payer: Self-pay

## 2021-06-12 MED ORDER — JARDIANCE 25 MG PO TABS
25.0000 mg | ORAL_TABLET | Freq: Every day | ORAL | 0 refills | Status: DC
  Filled 2021-06-12: qty 90, 90d supply, fill #0

## 2021-06-14 ENCOUNTER — Ambulatory Visit: Payer: 59

## 2021-06-14 ENCOUNTER — Other Ambulatory Visit (HOSPITAL_BASED_OUTPATIENT_CLINIC_OR_DEPARTMENT_OTHER): Payer: Self-pay

## 2021-06-23 ENCOUNTER — Other Ambulatory Visit (HOSPITAL_COMMUNITY): Payer: Self-pay

## 2021-07-12 ENCOUNTER — Other Ambulatory Visit (HOSPITAL_COMMUNITY): Payer: Self-pay

## 2021-07-12 ENCOUNTER — Other Ambulatory Visit: Payer: Self-pay | Admitting: Internal Medicine

## 2021-07-12 MED ORDER — TRESIBA FLEXTOUCH 200 UNIT/ML ~~LOC~~ SOPN
110.0000 [IU] | PEN_INJECTOR | Freq: Every day | SUBCUTANEOUS | 2 refills | Status: DC
  Filled 2021-07-12: qty 15, 27d supply, fill #0
  Filled 2021-08-07: qty 15, 27d supply, fill #1
  Filled 2021-09-06: qty 15, 27d supply, fill #2

## 2021-07-17 ENCOUNTER — Other Ambulatory Visit (HOSPITAL_COMMUNITY): Payer: Self-pay

## 2021-08-07 ENCOUNTER — Other Ambulatory Visit (HOSPITAL_COMMUNITY): Payer: Self-pay

## 2021-08-07 ENCOUNTER — Other Ambulatory Visit: Payer: Self-pay | Admitting: Internal Medicine

## 2021-08-07 MED ORDER — LISINOPRIL-HYDROCHLOROTHIAZIDE 20-25 MG PO TABS
1.0000 | ORAL_TABLET | Freq: Every day | ORAL | 1 refills | Status: DC
  Filled 2021-08-07: qty 90, 90d supply, fill #0
  Filled 2021-11-10: qty 90, 90d supply, fill #1

## 2021-08-31 ENCOUNTER — Encounter: Payer: Self-pay | Admitting: Internal Medicine

## 2021-08-31 ENCOUNTER — Ambulatory Visit: Payer: 59 | Admitting: Internal Medicine

## 2021-08-31 ENCOUNTER — Other Ambulatory Visit: Payer: Self-pay

## 2021-08-31 VITALS — BP 128/74 | HR 73 | Resp 18 | Ht 66.0 in | Wt 230.6 lb

## 2021-08-31 DIAGNOSIS — E118 Type 2 diabetes mellitus with unspecified complications: Secondary | ICD-10-CM

## 2021-08-31 DIAGNOSIS — L608 Other nail disorders: Secondary | ICD-10-CM | POA: Diagnosis not present

## 2021-08-31 DIAGNOSIS — E1142 Type 2 diabetes mellitus with diabetic polyneuropathy: Secondary | ICD-10-CM

## 2021-08-31 DIAGNOSIS — E1169 Type 2 diabetes mellitus with other specified complication: Secondary | ICD-10-CM | POA: Diagnosis not present

## 2021-08-31 DIAGNOSIS — E785 Hyperlipidemia, unspecified: Secondary | ICD-10-CM

## 2021-08-31 LAB — CBC
HCT: 43.2 % (ref 36.0–46.0)
Hemoglobin: 14.2 g/dL (ref 12.0–15.0)
MCHC: 32.9 g/dL (ref 30.0–36.0)
MCV: 91.2 fl (ref 78.0–100.0)
Platelets: 297 10*3/uL (ref 150.0–400.0)
RBC: 4.73 Mil/uL (ref 3.87–5.11)
RDW: 13.1 % (ref 11.5–15.5)
WBC: 7.6 10*3/uL (ref 4.0–10.5)

## 2021-08-31 LAB — COMPREHENSIVE METABOLIC PANEL
ALT: 22 U/L (ref 0–35)
AST: 19 U/L (ref 0–37)
Albumin: 4.4 g/dL (ref 3.5–5.2)
Alkaline Phosphatase: 76 U/L (ref 39–117)
BUN: 18 mg/dL (ref 6–23)
CO2: 27 mEq/L (ref 19–32)
Calcium: 9.2 mg/dL (ref 8.4–10.5)
Chloride: 101 mEq/L (ref 96–112)
Creatinine, Ser: 0.58 mg/dL (ref 0.40–1.20)
GFR: 101.33 mL/min (ref >60.00)
Glucose, Bld: 100 mg/dL — ABNORMAL HIGH (ref 70–99)
Potassium: 3.6 mEq/L (ref 3.5–5.1)
Sodium: 137 mEq/L (ref 135–145)
Total Bilirubin: 0.5 mg/dL (ref 0.2–1.2)
Total Protein: 7.4 g/dL (ref 6.0–8.3)

## 2021-08-31 LAB — LIPID PANEL
Cholesterol: 124 mg/dL (ref 0–200)
HDL: 41.5 mg/dL (ref >39.00)
LDL Cholesterol: 53 mg/dL (ref 0–99)
NonHDL: 82.92
Total CHOL/HDL Ratio: 3
Triglycerides: 152 mg/dL — ABNORMAL HIGH (ref 0.0–149.0)
VLDL: 30.4 mg/dL (ref 0.0–40.0)

## 2021-08-31 LAB — MICROALBUMIN / CREATININE URINE RATIO
Creatinine,U: 37.3 mg/dL
Microalb Creat Ratio: 1.9 mg/g (ref 0.0–30.0)
Microalb, Ur: <0.7 mg/dL (ref 0.0–1.9)

## 2021-08-31 LAB — HEMOGLOBIN A1C: Hgb A1c MFr Bld: 8.2 % — ABNORMAL HIGH (ref 4.6–6.5)

## 2021-08-31 NOTE — Progress Notes (Signed)
° °  Subjective:   Patient ID: Donna [name], female    DOB: 01-Jun-1966, 56 y.o.   MRN: [id number]  HPI The patient is a 56 YO female coming in for follow up and toenail problem.   Review of Systems  Constitutional: Negative.   HENT: Negative.    Eyes: Negative.   Respiratory:  Negative for cough, chest tightness and shortness of breath.   Cardiovascular:  Negative for chest pain, palpitations and leg swelling.  Gastrointestinal:  Negative for abdominal distention, abdominal pain, constipation, diarrhea, nausea and vomiting.  Musculoskeletal: Negative.   Skin:  Positive for wound.  Neurological:  Positive for numbness.  Psychiatric/Behavioral: Negative.     Objective:  Physical Exam Constitutional:      Appearance: She is well-developed.  HENT:     Head: Normocephalic and atraumatic.  Cardiovascular:     Rate and Rhythm: Normal rate and regular rhythm.  Pulmonary:     Effort: Pulmonary effort is normal. No respiratory distress.     Breath sounds: Normal breath sounds. No wheezing or rales.  Abdominal:     General: Bowel sounds are normal. There is no distension.     Palpations: Abdomen is soft.     Tenderness: There is no abdominal tenderness. There is no rebound.  Musculoskeletal:     Cervical back: Normal range of motion.  Skin:    General: Skin is warm and dry.     Comments: See foot exam  Neurological:     Mental Status: She is alert and oriented to person, place, and time.     Coordination: Coordination normal.    Vitals:   08/31/21 1114  BP: 128/74  Pulse: 73  Resp: 18  SpO2: 98%  Weight: 230 lb 9.6 oz (104.6 kg)  Height: 5\' 6"  (1.676 m)    This visit occurred during the SARS-CoV-2 public health emergency.  Safety protocols were in place, including screening questions prior to the visit, additional usage of staff PPE, and extensive cleaning of exam room while observing appropriate contact time as indicated for disinfecting solutions.   Assessment & Plan:

## 2021-08-31 NOTE — Patient Instructions (Signed)
We will get you in with a podiatrist to take care of the toenail.

## 2021-09-01 ENCOUNTER — Encounter: Payer: Self-pay | Admitting: Internal Medicine

## 2021-09-01 DIAGNOSIS — L608 Other nail disorders: Secondary | ICD-10-CM | POA: Insufficient documentation

## 2021-09-01 NOTE — Assessment & Plan Note (Signed)
BMI 37 complicated by diabetes and hyperlipidemia.

## 2021-09-01 NOTE — Assessment & Plan Note (Signed)
Needs note for work to accommodate. Checking Hga1c and adjust jardiance and metformin and tresiba as needed. Stable moderate to severe neuropathy.

## 2021-09-01 NOTE — Assessment & Plan Note (Signed)
Right great toenail with partial detachment, there is some wound at the distal portion of the toe likely due to pressure. She does have moderate to severe neuropathy.

## 2021-09-01 NOTE — Assessment & Plan Note (Signed)
Checking lipid panel and adjust atorvastatin 80 mg daily as needed.  °

## 2021-09-01 NOTE — Assessment & Plan Note (Signed)
Taking gabapentin 300 mg QID for pain. She does have worsening pain at night time.

## 2021-09-06 ENCOUNTER — Other Ambulatory Visit (HOSPITAL_COMMUNITY): Payer: Self-pay

## 2021-09-06 ENCOUNTER — Encounter: Payer: Self-pay | Admitting: Podiatry

## 2021-09-06 ENCOUNTER — Other Ambulatory Visit: Payer: Self-pay | Admitting: Internal Medicine

## 2021-09-06 ENCOUNTER — Other Ambulatory Visit: Payer: Self-pay

## 2021-09-06 ENCOUNTER — Ambulatory Visit: Payer: 59 | Admitting: Podiatry

## 2021-09-06 DIAGNOSIS — S90211A Contusion of right great toe with damage to nail, initial encounter: Secondary | ICD-10-CM

## 2021-09-06 DIAGNOSIS — L603 Nail dystrophy: Secondary | ICD-10-CM

## 2021-09-06 MED ORDER — CEPHALEXIN 500 MG PO CAPS
500.0000 mg | ORAL_CAPSULE | Freq: Four times a day (QID) | ORAL | 0 refills | Status: AC
  Filled 2021-09-06: qty 28, 7d supply, fill #0

## 2021-09-06 MED ORDER — JARDIANCE 25 MG PO TABS
25.0000 mg | ORAL_TABLET | Freq: Every day | ORAL | 0 refills | Status: DC
  Filled 2021-09-06: qty 90, 90d supply, fill #0

## 2021-09-06 NOTE — Patient Instructions (Signed)

## 2021-09-06 NOTE — Progress Notes (Signed)
°  Subjective:  Patient ID: Donna [name], female    DOB: [birth year],   MRN: [id number]  Chief Complaint  Patient presents with   Nail Problem    Pt is here due to right great toe problem . No pain Nail is thick and dark . Would like nail removed    56 y.o. female presents for concern of right great toenail. Was referred here by PCP for thickened and dark toenail. Relates it is loose at the end. Does not recall any injury. Denies any pain. She is diabetic and her last A1c was 8.2. Denies any other pedal complaints. Denies n/v/f/c.   Past Medical History:  Diagnosis Date   Diabetes mellitus    type 2- uncontrolled   Hyperlipidemia    Hypertension    Lumbar disc disease    Obesity     Objective:  Physical Exam: Vascular: DP/PT pulses 2/4 bilateral. CFT <3 seconds. Normal hair growth on digits. No edema.  Skin. No lacerations or abrasions bilateral feet. Right hallux nail thickened and discolored with hemosiderin under nail plate noted. Distal aspect of nail is loosened and partially avulsed. Mild erythema around proximal nail fold. No edema and no purulence noted.  Musculoskeletal: MMT 5/5 bilateral lower extremities in DF, PF, Inversion and Eversion. Deceased ROM in DF of ankle joint.  Neurological: Sensation intact to light touch.   Assessment:   1. Contusion of right great toe with damage to nail, initial encounter   2. Onychodystrophy      Plan:  Patient was evaluated and treated and all questions answered. Patient requesting removal of ingrown nail today. Procedure below.  Prescription for keflex provided prophylactically. Did discuss risk of healing with increased blood sugars and patient expressed understanding.  Discussed procedure and post procedure care and patient expressed understanding.  Will follow-up in 2 weeks for nail check or sooner if any problems arise.    Procedure:  Procedure: total Nail Avulsion of right hallux   Surgeon: Louann Sjogren, DPM  Pre-op  Dx: Ingrown toenail without infection Post-op: Same  Place of Surgery: Office exam room.  Indications for surgery: Painful and ingrown toenail.    The patient is requesting removal of nail without chemical matrixectomy. Risks and complications were discussed with the patient for which they understand and written consent was obtained. Under sterile conditions a total of 3 mL of  1% lidocaine plain was infiltrated in a hallux block fashion. Once anesthetized, the skin was prepped in sterile fashion. A tourniquet was then applied. Next the right hallux nail was remove and area copiously irrigated. Silvadene was applied. A dry sterile dressing was applied. After application of the dressing the tourniquet was removed and there is found to be an immediate capillary refill time to the digit. The patient tolerated the procedure well without any complications. Post procedure instructions were discussed the patient for which he verbally understood. Follow-up in two weeks for nail check or sooner if any problems are to arise. Discussed signs/symptoms of infection and directed to call the office immediately should any occur or go directly to the emergency room. In the meantime, encouraged to call the office with any questions, concerns, changes symptoms.   Louann Sjogren, DPM

## 2021-09-20 ENCOUNTER — Other Ambulatory Visit: Payer: Self-pay

## 2021-09-20 ENCOUNTER — Encounter: Payer: Self-pay | Admitting: Podiatry

## 2021-09-20 ENCOUNTER — Ambulatory Visit: Payer: 59 | Admitting: Podiatry

## 2021-09-20 DIAGNOSIS — S90211A Contusion of right great toe with damage to nail, initial encounter: Secondary | ICD-10-CM

## 2021-09-20 DIAGNOSIS — L603 Nail dystrophy: Secondary | ICD-10-CM

## 2021-09-20 NOTE — Progress Notes (Signed)
°  Subjective:  Patient ID: Donna [name], female    DOB: 1965-08-31,   MRN: [id number]  No chief complaint on file.   56 y.o. female presents for follow-up of removal of right great toenail. Relates she is doing well. Had stopped the soaks. Denies any pain. She is diabetic and her last A1c was 8.2. Denies any other pedal complaints. Denies n/v/f/c.   Past Medical History:  Diagnosis Date   Diabetes mellitus    type 2- uncontrolled   Hyperlipidemia    Hypertension    Lumbar disc disease    Obesity     Objective:  Physical Exam: Vascular: DP/PT pulses 2/4 bilateral. CFT <3 seconds. Normal hair growth on digits. No edema.  Skin. No lacerations or abrasions bilateral feet. Right hallux nail bed appears well healed. Hyperkeratosis overlying. Mild erythema surrounding but improved.  Musculoskeletal: MMT 5/5 bilateral lower extremities in DF, PF, Inversion and Eversion. Deceased ROM in DF of ankle joint.  Neurological: Sensation intact to light touch.   Assessment:   1. Contusion of right great toe with damage to nail, initial encounter   2. Onychodystrophy       Plan:  Patient was evaluated and treated and all questions answered. Toe was evaluated and appears to be healing well.  May discontinue soaks and neosporin.  Patient to follow-up as needed.      Lorenda Peck, DPM

## 2021-10-16 ENCOUNTER — Other Ambulatory Visit (HOSPITAL_COMMUNITY): Payer: Self-pay

## 2021-10-16 ENCOUNTER — Other Ambulatory Visit: Payer: Self-pay | Admitting: Internal Medicine

## 2021-10-17 ENCOUNTER — Other Ambulatory Visit (HOSPITAL_COMMUNITY): Payer: Self-pay

## 2021-10-17 MED ORDER — TRESIBA FLEXTOUCH 200 UNIT/ML ~~LOC~~ SOPN
110.0000 [IU] | PEN_INJECTOR | Freq: Every day | SUBCUTANEOUS | 2 refills | Status: DC
  Filled 2021-10-17: qty 15, 27d supply, fill #0
  Filled 2021-11-10: qty 15, 27d supply, fill #1
  Filled 2021-12-25: qty 15, 27d supply, fill #2

## 2021-11-10 ENCOUNTER — Other Ambulatory Visit (HOSPITAL_COMMUNITY): Payer: Self-pay

## 2021-11-10 ENCOUNTER — Other Ambulatory Visit: Payer: Self-pay | Admitting: Internal Medicine

## 2021-11-13 ENCOUNTER — Other Ambulatory Visit (HOSPITAL_COMMUNITY): Payer: Self-pay

## 2021-11-13 MED ORDER — GABAPENTIN 300 MG PO CAPS
ORAL_CAPSULE | Freq: Four times a day (QID) | ORAL | 1 refills | Status: DC
  Filled 2021-11-13: qty 360, 90d supply, fill #0
  Filled 2022-03-05: qty 360, 90d supply, fill #1

## 2021-11-17 ENCOUNTER — Other Ambulatory Visit (HOSPITAL_COMMUNITY): Payer: Self-pay

## 2021-12-13 ENCOUNTER — Other Ambulatory Visit (HOSPITAL_COMMUNITY): Payer: Self-pay | Admitting: Physician Assistant

## 2021-12-13 ENCOUNTER — Other Ambulatory Visit (HOSPITAL_COMMUNITY): Payer: Self-pay

## 2021-12-13 ENCOUNTER — Encounter (HOSPITAL_BASED_OUTPATIENT_CLINIC_OR_DEPARTMENT_OTHER): Payer: 59 | Attending: Physician Assistant | Admitting: Physician Assistant

## 2021-12-13 ENCOUNTER — Ambulatory Visit (HOSPITAL_COMMUNITY)
Admission: RE | Admit: 2021-12-13 | Discharge: 2021-12-13 | Disposition: A | Discharge status: Home / Self Care | Payer: 59 | Source: Ambulatory Visit | Attending: Physician Assistant | Admitting: Physician Assistant

## 2021-12-13 ENCOUNTER — Other Ambulatory Visit: Payer: Self-pay | Admitting: Internal Medicine

## 2021-12-13 DIAGNOSIS — E11621 Type 2 diabetes mellitus with foot ulcer: Secondary | ICD-10-CM

## 2021-12-13 DIAGNOSIS — I10 Essential (primary) hypertension: Secondary | ICD-10-CM | POA: Diagnosis not present

## 2021-12-13 DIAGNOSIS — L97509 Non-pressure chronic ulcer of other part of unspecified foot with unspecified severity: Secondary | ICD-10-CM | POA: Diagnosis not present

## 2021-12-13 DIAGNOSIS — E1169 Type 2 diabetes mellitus with other specified complication: Secondary | ICD-10-CM | POA: Insufficient documentation

## 2021-12-13 DIAGNOSIS — L97512 Non-pressure chronic ulcer of other part of right foot with fat layer exposed: Secondary | ICD-10-CM | POA: Diagnosis not present

## 2021-12-13 DIAGNOSIS — L97812 Non-pressure chronic ulcer of other part of right lower leg with fat layer exposed: Secondary | ICD-10-CM | POA: Insufficient documentation

## 2021-12-13 DIAGNOSIS — M19071 Primary osteoarthritis, right ankle and foot: Secondary | ICD-10-CM | POA: Diagnosis not present

## 2021-12-13 DIAGNOSIS — E1142 Type 2 diabetes mellitus with diabetic polyneuropathy: Secondary | ICD-10-CM | POA: Diagnosis not present

## 2021-12-13 DIAGNOSIS — M869 Osteomyelitis, unspecified: Secondary | ICD-10-CM | POA: Insufficient documentation

## 2021-12-13 DIAGNOSIS — S91101A Unspecified open wound of right great toe without damage to nail, initial encounter: Secondary | ICD-10-CM | POA: Diagnosis not present

## 2021-12-13 MED ORDER — DOXYCYCLINE HYCLATE 100 MG PO CAPS
100.0000 mg | ORAL_CAPSULE | Freq: Two times a day (BID) | ORAL | 0 refills | Status: DC
  Filled 2021-12-13: qty 28, 14d supply, fill #0

## 2021-12-13 MED ORDER — JARDIANCE 25 MG PO TABS
25.0000 mg | ORAL_TABLET | Freq: Every day | ORAL | 2 refills | Status: DC
  Filled 2021-12-13: qty 90, 90d supply, fill #0
  Filled 2022-03-14: qty 90, 90d supply, fill #1
  Filled 2022-06-28: qty 90, 90d supply, fill #2

## 2021-12-13 NOTE — Progress Notes (Signed)
Donna [name], Donna [name] ([id number]) ?Visit Report for 12/13/2021 ?Allergy List Details ?Patient Name: Date of Service: ?Donna [name], Donna [name]. 12/13/2021 10:30 A M ?Medical Record Number: [id number] ?Patient Account Number: [id number] ?Date of Birth/Sex: Treating RN: ?[birth year] (57 y.o. F) Deaton, Bobbi ?Primary Care Haskell Rihn: Hillard Danker Other Clinician: ?Referring Wai Minotti: ?Treating Baley Lorimer/Extender: Lenda Kelp ?Hillard Danker ?Weeks in Treatment: 0 ?Allergies ?Active Allergies ?No Known Drug Allergies ?Allergy Notes ?Electronic Signature(s) ?Signed: 12/13/2021 3:21:12 PM By: Shawn Stall RN, BSN ?Entered By: Shawn Stall on 12/12/2021 16:39:20 ?-------------------------------------------------------------------------------- ?Arrival Information Details ?Patient Name: Date of Service: ?Donna [name], Donna [name]. 12/13/2021 10:30 A M ?Medical Record Number: [id number] ?Patient Account Number: [id number] ?Date of Birth/Sex: Treating RN: ?June 08, 1966 (56 y.o. F) Deaton, Bobbi ?Primary Care Mollyann Halbert: Hillard Danker Other Clinician: ?Referring Shawnee Gambone: ?Treating Reyah Streeter/Extender: Lenda Kelp ?Hillard Danker ?Weeks in Treatment: 0 ?Visit Information ?Patient Arrived: Ambulatory ?Arrival Time: 10:22 ?Accompanied By: self ?Transfer Assistance: None ?Patient Identification Verified: Yes ?Secondary Verification Process Completed: Yes ?Patient Requires Transmission-Based Precautions: No ?Patient Has Alerts: No ?History Since Last Visit ?Added or deleted any medications: No ?Any new allergies or adverse reactions: No ?Had a fall or experienced change in activities of daily living that may affect risk of falls: No ?Signs or symptoms of abuse/neglect since last visito No ?Hospitalized since last visit: No ?Implantable device outside of the clinic excluding cellular tissue based products placed in the center since last visit: No ?Electronic Signature(s) ?Signed: 12/13/2021 3:21:12 PM By: Shawn Stall RN, BSN ?Entered  By: Shawn Stall on 12/13/2021 10:24:39 ?-------------------------------------------------------------------------------- ?Clinic Level of Care Assessment Details ?Patient Name: Date of Service: ?Donna [name], Donna [name]. 12/13/2021 10:30 A M ?Medical Record Number: [id number] ?Patient Account Number: [id number] ?Date of Birth/Sex: Treating RN: ?07/09/1966 (56 y.o. F) Deaton, Bobbi ?Primary Care Sonam Huelsmann: Hillard Danker Other Clinician: ?Referring Thermon Zulauf: ?Treating Syris Brookens/Extender: Lenda Kelp ?Hillard Danker ?Weeks in Treatment: 0 ?Clinic Level of Care Assessment Items ?TOOL 1 Quantity Score ?X- 1 0 ?Use when EandM and Procedure is performed on INITIAL visit ?ASSESSMENTS - Nursing Assessment / Reassessment ?X- 1 20 ?General Physical Exam (combine w/ comprehensive assessment (listed just below) when performed on new pt. evals) ?X- 1 25 ?Comprehensive Assessment (HX, ROS, Risk Assessments, Wounds Hx, etc.) ?ASSESSMENTS - Wound and Skin Assessment / Reassessment ?X- 1 10 ?Dermatologic / Skin Assessment (not related to wound area) ?ASSESSMENTS - Ostomy and/or Continence Assessment and Care ?[]  - 0 ?Incontinence Assessment and Management ?[]  - 0 ?Ostomy Care Assessment and Management (repouching, etc.) ?PROCESS - Coordination of Care ?X - Simple Patient / Family Education for ongoing care 1 15 ?[]  - 0 ?Complex (extensive) Patient / Family Education for ongoing care ?X- 1 10 ?Staff obtains Consents, Records, T Results / Process Orders ?est ?[]  - 0 ?Staff telephones HHA, Nursing Homes / Clarify orders / etc ?[]  - 0 ?Routine Transfer to another Facility (non-emergent condition) ?[]  - 0 ?Routine Hospital Admission (non-emergent condition) ?X- 1 15 ?New Admissions / / Ordering NPWT Apligraf, etc. ?, ?[]  - 0 ?Emergency Hospital Admission (emergent condition) ?PROCESS - Special Needs ?[]  - 0 ?Pediatric / Minor Patient Management ?[]  - 0 ?Isolation Patient Management ?[]  - 0 ?Hearing / Language  / Visual special needs ?[]  - 0 ?Assessment of Community assistance (transportation, D/C planning, etc.) ?[]  - 0 ?Additional assistance / Altered mentation ?[]  - 0 ?Support Surface(s) Assessment (bed, cushion, seat, etc.) ?INTERVENTIONS - Miscellaneous ?[]  - 0 ?External ear exam ?[]  - 0 ?Patient Transfer (multiple staff / / Similar  devices) ?[]  - 0 ?Simple Staple / Suture removal (25 or less) ?[]  - 0 ?Complex Staple / Suture removal (26 or more) ?[]  - 0 ?Hypo/Hyperglycemic Management (do not check if billed separately) ?X- 1 15 ?Ankle / Brachial Index (ABI) - do not check if billed separately ?Has the patient been seen at the hospital within the last three years: Yes ?Total Score: 110 ?Level Of Care: New/Established - Level 3 ?Electronic Signature(s) ?Signed: 12/13/2021 3:21:12 PM By: RN, BSN ?Entered By: on 12/13/2021 11:29:25 ?-------------------------------------------------------------------------------- ?Encounter Discharge Information Details ?Patient Name: ?Date of Service: ?Donna [name], Donna [name]. 12/13/2021 10:30 A M ?Medical Record Number: 12/15/2021 ?Patient Account Number: [name] ?Date of Birth/Sex: ?Treating RN: ?1966/01/29 (56 y.o. F) Deaton, Bobbi ?Primary Care Jameisha Stofko: [id number] ?Other Clinician: ?Referring Kanin Lia: ?Treating Nazifa Trinka/Extender: 13/12/1965 ?(46 ?Weeks in Treatment: 0 ?Encounter Discharge Information Items Post Procedure Vitals ?Discharge Condition: Stable ?Temperature (F): 98.3 ?Ambulatory Status: Ambulatory ?Pulse (bpm): 78 ?Discharge Destination: Home ?Respiratory Rate (breaths/min): 20 ?Transportation: Private Auto ?Blood Pressure (mmHg): 126/78 ?Accompanied By: self ?Schedule Follow-up Appointment: Yes ?Clinical Summary of Care: ?Electronic Signature(s) ?Signed: 12/13/2021 3:21:12 PM By: Lenda Kelp RN, BSN ?Entered By: Hillard Danker on 12/13/2021  11:30:47 ?-------------------------------------------------------------------------------- ?Lower Extremity Assessment Details ?Patient Name: ?Date of Service: ?Donna [name], Donna [name]. 12/13/2021 10:30 A M ?Medical Record Number: 12/15/2021 ?Patient Account Number: [name] ?Date of Birth/Sex: ?Treating RN: ?08/28/1965 (56 y.o. F) Deaton, Bobbi ?Primary Care Derak Schurman: [id number] ?Other Clinician: ?Referring Yaritsa Savarino: ?Treating Naquisha Whitehair/Extender: 13/12/1965 ?(46 ?Weeks in Treatment: 0 ?Edema Assessment ?Assessed: [Left: No] [Right: Yes] ?Edema: [Left: Ye] [Right: s] ?Calf ?Left: Right: ?Point of Measurement: 28 cm From Medial Instep 36 cm ?Ankle ?Left: Right: ?Point of Measurement: 8 cm From Medial Instep 24 cm ?Knee To Floor ?Left: Right: ?From Medial Instep 39 cm ?Vascular Assessment ?Pulses: ?Dorsalis Pedis ?Palpable: [Right:Yes] ?Doppler Audible: [Right:Yes] ?Posterior Tibial ?Palpable: [Right:Yes] ?Doppler Audible: [Right:Yes] ?Blood Pressure: ?Brachial: [Right:126] ?Ankle: ?Electronic Signature(s) ?Signed: 12/13/2021 3:21:12 PM By: Lenda Kelp RN, BSN ?Entered By: Hillard Danker on 12/13/2021 10:42:50 ?-------------------------------------------------------------------------------- ?Multi-Disciplinary Care Plan Details ?Patient Name: ?Date of Service: ?Donna [name], Donna [name]. 12/13/2021 10:30 A M ?Medical Record Number: 12/15/2021 ?Patient Account Number: [name] ?Date of Birth/Sex: ?Treating RN: ?01-30-1966 (56 y.o. F) Deaton, Bobbi ?Primary Care Weaver Tweed: [id number] ?Other Clinician: ?Referring Jacquise Rarick: ?Treating Darianna Amy/Extender: 13/12/1965 ?(46 ?Weeks in Treatment: 0 ?Active Inactive ?Orientation to the Wound Care Program ?Nursing Diagnoses: ?Knowledge deficit related to the wound healing center program ?Goals: ?Patient/caregiver will verbalize understanding of the Wound Healing Center Program ?Date Initiated: 12/13/2021 ?Target Resolution Date: 12/22/2021 ?Goal  Status: Active ?Interventions: ?Provide education on orientation to the wound center ?Notes: ?Pain, Acute or Chronic ?Nursing Diagnoses: ?Pain, acute or chronic: actual or potential ?Potential alteration in comfort, pain ?Goals: ?Patient will verbalize adequate pain c

## 2021-12-13 NOTE — Progress Notes (Signed)
Donna [name] ([id number]) ?Visit Report for 12/13/2021 ?Chief Complaint Document Details ?Patient Name: Date of Service: ?Donna [name], Donna [name]. 12/13/2021 10:30 A M ?Medical Record Number: [id number] ?Patient Account Number: [id number] ?Date of Birth/Sex: Treating RN: ?19-Mar-1966 (56 y.o. F) Donna [name] ?Primary Care Provider: Hillard Danker Other Clinician: ?Referring Provider: ?Treating Provider/Extender: Lenda Kelp ?Hillard Danker ?Weeks in Treatment: 0 ?Information Obtained from: Patient ?Chief Complaint ?Right great toe ulcer ?Electronic Signature(s) ?Signed: 12/13/2021 11:10:57 AM By: Lenda Kelp PA-C ?Previous Signature: 12/13/2021 10:27:26 AM Version By: Lenda Kelp PA-C ?Entered By: Lenda Kelp on 12/13/2021 11:10:57 ?-------------------------------------------------------------------------------- ?Debridement Details ?Patient Name: Date of Service: ?Donna [name], Donna [name]. 12/13/2021 10:30 A M ?Medical Record Number: [id number] ?Patient Account Number: [id number] ?Date of Birth/Sex: Treating RN: ?1966/08/03 (56 y.o. F) Donna [name] ?Primary Care Provider: Hillard Danker Other Clinician: ?Referring Provider: ?Treating Provider/Extender: Lenda Kelp ?Hillard Danker ?Weeks in Treatment: 0 ?Debridement Performed for Assessment: Wound #2 Right T Great ?oe ?Performed By: Physician Lenda Kelp, PA ?Debridement Type: Debridement ?Severity of Tissue Pre Debridement: Fat layer exposed ?Level of Consciousness (Pre-procedure): Awake and Alert ?Pre-procedure Verification/Time Out Yes - 11:05 ?Taken: ?Start Time: 11:06 ?Pain Control: Lidocaine 5% topical ointment ?T Area Debrided (L x W): ?otal 3 (cm) x 3 (cm) = 9 (cm?) ?Tissue and other material debrided: ?Viable, Non-Viable, Callus, Slough, Subcutaneous, Skin: Dermis , Skin: Epidermis, Fibrin/Exudate, Slough ?Level: Skin/Subcutaneous Tissue ?Debridement Description: Excisional ?Instrument: Curette ?Specimen: Swab, ?Number of Specimens  T aken: 1 ?Bleeding: Minimum ?Hemostasis Achieved: Pressure ?End Time: 11:12 ?Procedural Pain: 0 ?Post Procedural Pain: 0 ?Response to Treatment: Procedure was tolerated well ?Level of Consciousness (Post- Awake and Alert ?procedure): ?Post Debridement Measurements of Total Wound ?Length: (cm) 2.3 ?Width: (cm) 2 ?Depth: (cm) 0.1 ?Volume: (cm?) 0.361 ?Character of Wound/Ulcer Post Debridement: Improved ?Severity of Tissue Post Debridement: Fat layer exposed ?Post Procedure Diagnosis ?Same as Pre-procedure ?Electronic Signature(s) ?Signed: 12/13/2021 3:21:12 PM By: Shawn Stall RN, BSN ?Signed: 12/13/2021 4:00:03 PM By: Lenda Kelp PA-C ?Entered By: Shawn Stall on 12/13/2021 11:13:10 ?-------------------------------------------------------------------------------- ?HPI Details ?Patient Name: Date of Service: ?Donna [name], Donna [name]. 12/13/2021 10:30 A M ?Medical Record Number: [id number] ?Patient Account Number: [id number] ?Date of Birth/Sex: Treating RN: ?05/07/1966 (56 y.o. 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 She tells me that since at least last summer she has had a progressive ?callused area that has opened closed but is never really healed. She has a history of calluses on her feet. She is a diabetic with neuropathy. She was referred ?to Dr. Allena Katz at [facility] who felt that this might be a large plantar wart she was treated with laser for 2 treatments and then subsequently with ?ammonium lactate none of this is apparently made any difference according to the patient. She has a very odd looking area with thick skin and callus and an ?almost crossed like area of open tissue. She also has erythema above this  which she says is chronic. The other interesting part of her recent history is ?recurrent cellulitis on the right lower leg although this is separated from the area we are looking at. She has had trips to see infectious disease has been on ?Keflex for as long as 20 days. This is presumed to be strep.  She saw Dr. Renold Don ?Past medical history includes type 2 diabetes, recurrent cellulitis of the right leg, right Achilles tendon repair a year ago for spontaneous rupture ?ABI in our clinic was 1.09 on the right ?2/17; patient admitted the clinic last week had a thick callused area on the right lateral heel she is a diabetic with neuropathy. She had thick callus and across ?like area of open wound. I aggressively debrided this area to remove most of the callus and gave her Hydrofera Blue and heel off loader, heel cup and she ?has been wearing this religiously trying to keep the pressure off the area. She comes in today with a cross-like area of injury has healed she has a new open ?area that I do not remember from last time. Still some dry flaking skin and callus but not nearly as much as last week at which time I had to use a scalpel to ?remove this. 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 She came in 2 weeks ago with a very thick hyperkeratotic callused area on her right lateral ?heel. There was a "crossed" shaped area of split tissue with an open wound at the bottom. This had previously been treated as a possible wart without ?improvement. I removed all of the callus when she first came in here. This is mostly epithelialized over still thick skin but nothing like it was. She has 1 small ?open area that has not healed she has been more religious about offloading the area ?10/27/2020 upon evaluation today patient appears to be doing well with regard to her wound although she does still apparently appear to have something still ?open in the base of the wound. Fortunately there is no evidence of  infection at this time which is great news and in general I feel like she is doing decently well ?though she has a lot of callus buildup she is using the offloading shoe for her heel. At home she does not always wear this she tells me. Generally she is using ?flip-flops there. Of note her primary care provider did give her Keflex yesterday for cellulitis of this leg but has nothing to do with the heel location. ?3/17; the patient's area actually looks a lot better. There is no open wound here. Much less callus. She was given a prescription for 40% urea cream I am not ?sure that really is required this week. She is going to require less friction on this area. I think she is going to need a different type of shoe going forward ?Readmission: ?12-13-2028 upon evaluation today patient presents for reevaluation in the clinic she was last seen by Dr. Leanord Hawking on 11-03-2020 when she was discharged this ?was due to a crack on her heel. Currently she is actually having an issue with her great toe on the right foot. She tells me that this has been present currently ?for a couple of weeks since around November 22, 2021. She has had a lot of callus over this area in general but not an open wound until that time. Subsequently this ?has more recently opened and has Become a greater concern she is also noted some odor which is disconcerting to her. She tells me that her greatest concern ?is that "she does not lose this toe". Again I completely understand her concern in this regard I definitely think we need to be aggressive in treating this and try ?to get things better. She is very Adult nurse. ?Patient does have a history of  diabetes mellitus type 2 with diabetic neuropathy. She also tells me that she does have a history as well of hypertension which ?is pretty well controlled. She has not yet had an x-ray of the area and has not been on any antibiotics. Both are things I think we will need to do today. ?Electronic Signature(s) ?Signed:  12/13/2021 1:42:06 PM By: Lenda KelpStone III, Thersia Petraglia PA-C ?Entered By: Lenda KelpStone III, Avielle Imbert on 12/13/2021 13:42:05 ?-------------------------------------------------------------------------------- ?Physical Exam Detai

## 2021-12-13 NOTE — Progress Notes (Signed)
Donna [name], Donna [name] ([id number]) ?Visit Report for 12/13/2021 ?Abuse Risk Screen Details ?Patient Name: Date of Service: ?Donna [name], Donna [name]. 12/13/2021 10:30 A M ?Medical Record Number: [id number] ?Patient Account Number: [id number] ?Date of Birth/Sex: Treating RN: ?September 05, 1965 (56 y.o. F) Deaton, Bobbi ?Primary Care Hargis Vandyne: Hillard Danker Other Clinician: ?Referring Camerin Jimenez: ?Treating Jamareon Shimel/Extender: Lenda Kelp ?Hillard Danker ?Weeks in Treatment: 0 ?Abuse Risk Screen Items ?Answer ?ABUSE RISK SCREEN: ?Has anyone close to you tried to hurt or harm you recentlyo No ?Do you feel uncomfortable with anyone in your familyo No ?Has anyone forced you do things that you didnt want to doo No ?Electronic Signature(s) ?Signed: 12/13/2021 3:21:12 PM By: Shawn Stall RN, BSN ?Entered By: Shawn Stall on 12/13/2021 10:27:15 ?-------------------------------------------------------------------------------- ?Activities of Daily Living Details ?Patient Name: Date of Service: ?Donna [name], Donna [name]. 12/13/2021 10:30 A M ?Medical Record Number: [id number] ?Patient Account Number: [id number] ?Date of Birth/Sex: Treating RN: ?[birth year] (56 y.o. F) Deaton, Bobbi ?Primary Care Deverick Pruss: Hillard Danker Other Clinician: ?Referring Autumn Pruitt: ?Treating Koralynn Greenspan/Extender: Lenda Kelp ?Hillard Danker ?Weeks in Treatment: 0 ?Activities of Daily Living Items ?Answer ?Activities of Daily Living (Please select one for each item) ?Drive Automobile Completely Able ?T Medications ?ake Completely Able ?Use T elephone Completely Able ?Care for Appearance Completely Able ?Use T oilet Completely Able ?Bath / Shower Completely Able ?Dress Self Completely Able ?Feed Self Completely Able ?Walk Completely Able ?Get In / Out Bed Completely Able ?Housework Completely Able ?Prepare Meals Completely Able ?Handle Money Completely Able ?Shop for Self Completely Able ?Electronic Signature(s) ?Signed: 12/13/2021 3:21:12 PM By: Shawn Stall RN,  BSN ?Entered By: Shawn Stall on 12/13/2021 10:27:33 ?-------------------------------------------------------------------------------- ?Education Screening Details ?Patient Name: ?Date of Service: ?Donna [name], Donna [name]. 12/13/2021 10:30 A M ?Medical Record Number: [id number] ?Patient Account Number: [id number] ?Date of Birth/Sex: ?Treating RN: ?1966/07/03 (56 y.o. F) Deaton, Bobbi ?Primary Care Rozena Fierro: Hillard Danker ?Other Clinician: ?Referring Salome Cozby: ?Treating Ephraim Reichel/Extender: Lenda Kelp ?Hillard Danker ?Weeks in Treatment: 0 ?Primary Learner Assessed: Patient ?Learning Preferences/Education Level/Primary Language ?Learning Preference: Explanation, Demonstration, Printed Material ?Highest Education Level: College or Above ?Preferred Language: English ?Cognitive Barrier ?Language Barrier: No ?Translator Needed: No ?Memory Deficit: No ?Emotional Barrier: No ?Cultural/Religious Beliefs Affecting Medical Care: No ?Physical Barrier ?Impaired Vision: No ?Impaired Hearing: No ?Decreased Hand dexterity: No ?Knowledge/Comprehension ?Knowledge Level: High ?Comprehension Level: High ?Ability to understand written instructions: High ?Ability to understand verbal instructions: High ?Motivation ?Anxiety Level: Calm ?Cooperation: Cooperative ?Education Importance: Acknowledges Need ?Interest in Health Problems: Asks Questions ?Perception: Coherent ?Willingness to Engage in Self-Management High ?Activities: ?Readiness to Engage in Self-Management High ?Activities: ?Electronic Signature(s) ?Signed: 12/13/2021 3:21:12 PM By: Shawn Stall RN, BSN ?Entered By: Shawn Stall on 12/13/2021 10:27:51 ?-------------------------------------------------------------------------------- ?Fall Risk Assessment Details ?Patient Name: ?Date of Service: ?Donna [name], Donna [name]. 12/13/2021 10:30 A M ?Medical Record Number: [id number] ?Patient Account Number: [id number] ?Date of Birth/Sex: ?Treating RN: ?1965/11/17 (56 y.o. F) Deaton,  Bobbi ?Primary Care Hasna Stefanik: Hillard Danker ?Other Clinician: ?Referring Tiffanye Hartmann: ?Treating Xzaviar Maloof/Extender: Lenda Kelp ?Hillard Danker ?Weeks in Treatment: 0 ?Fall Risk Assessment Items ?Have you had 2 or more falls in the last 12 monthso 0 No ?Have you had any fall that resulted in injury in the last 12 monthso 0 No ?FALLS RISK SCREEN ?History of falling - immediate or within 3 months 0 No ?Secondary diagnosis (Do you have 2 or more medical diagnoseso) 0 No ?Ambulatory aid ?None/bed rest/wheelchair/nurse 0 Yes ?Crutches/cane/walker 0 No ?Furniture 0 No ?Intravenous therapy Access/Saline/Heparin Lock 0 No ?Gait/Transferring ?Normal/ bed rest/ wheelchair 0  Yes ?Weak (short steps with or without shuffle, stooped but able to lift head while walking, may seek 0 No ?support from furniture) ?Impaired (short steps with shuffle, may have difficulty arising from chair, head down, impaired 0 No ?balance) ?Mental Status ?Oriented to own ability 0 Yes ?Electronic Signature(s) ?Signed: 12/13/2021 3:21:12 PM By: Shawn Stall RN, BSN ?Entered By: Shawn Stall on 12/13/2021 10:27:59 ?-------------------------------------------------------------------------------- ?Foot Assessment Details ?Patient Name: ?Date of Service: ?Donna [name], Donna [name]. 12/13/2021 10:30 A M ?Medical Record Number: [id number] ?Patient Account Number: [id number] ?Date of Birth/Sex: ?Treating RN: ?[birth year] (56 y.o. F) Deaton, Bobbi ?Primary Care Stephfon Bovey: Hillard Danker ?Other Clinician: ?Referring Brinlyn Cena: ?Treating Coty Larsh/Extender: Lenda Kelp ?Hillard Danker ?Weeks in Treatment: 0 ?Foot Assessment Items ?Site Locations ?+ = Sensation present, - = Sensation absent, C = Callus, U = Ulcer ?R = Redness, W = Warmth, M = Maceration, PU = Pre-ulcerative lesion ?F = Fissure, S = Swelling, D = Dryness ?Assessment ?Right: Left: ?Other Deformity: No No ?Prior Foot Ulcer: No No ?Prior Amputation: No No ?Charcot Joint: No No ?Ambulatory  Status: Ambulatory Without Help ?Gait: Steady ?Electronic Signature(s) ?Signed: 12/13/2021 3:21:12 PM By: Shawn Stall RN, BSN ?Entered By: Shawn Stall on 12/13/2021 10:43:22 ?-------------------------------------------------------------------------------- ?Nutrition Risk Screening Details ?Patient Name: ?Date of Service: ?Donna [name], Donna [name]. 12/13/2021 10:30 A M ?Medical Record Number: [id number] ?Patient Account Number: [id number] ?Date of Birth/Sex: ?Treating RN: ?1965-09-29 (56 y.o. F) Deaton, Bobbi ?Primary Care Fardowsa Authier: Hillard Danker ?Other Clinician: ?Referring Cowan Pilar: ?Treating Benjaman Artman/Extender: Lenda Kelp ?Hillard Danker ?Weeks in Treatment: 0 ?Height (in): 66 ?Weight (lbs): 220 ?Body Mass Index (BMI): 35.5 ?Nutrition Risk Screening Items ?Score Screening ?NUTRITION RISK SCREEN: ?I have an illness or condition that made me change the kind and/or amount of food I eat 2 Yes ?I eat fewer than two meals per day 0 No ?I eat few fruits and vegetables, or milk products 0 No ?I have three or more drinks of beer, liquor or wine almost every day 0 No ?I have tooth or mouth problems that make it hard for me to eat 0 No ?I don't always have enough money to buy the food I need 0 No ?I eat alone most of the time 0 No ?I take three or more different prescribed or over-the-counter drugs a day 1 Yes ?Without wanting to, I have lost or gained 10 pounds in the last six months 0 No ?I am not always physically able to shop, cook and/or feed myself 0 No ?Nutrition Protocols ?Good Risk Protocol ?Provide education on elevated blood ?Moderate Risk Protocol 0 sugars and impact on wound healing, ?as applicable ?High Risk Proctocol ?Risk Level: Moderate Risk ?Score: 3 ?Electronic Signature(s) ?Signed: 12/13/2021 3:21:12 PM By: Shawn Stall RN, BSN ?Entered By: Shawn Stall on 12/13/2021 10:28:11 ?

## 2021-12-14 ENCOUNTER — Ambulatory Visit: Payer: 59 | Admitting: Internal Medicine

## 2021-12-14 DIAGNOSIS — S91101A Unspecified open wound of right great toe without damage to nail, initial encounter: Secondary | ICD-10-CM | POA: Diagnosis not present

## 2021-12-14 DIAGNOSIS — S91309A Unspecified open wound, unspecified foot, initial encounter: Secondary | ICD-10-CM | POA: Diagnosis not present

## 2021-12-18 LAB — AEROBIC/ANAEROBIC CULTURE W GRAM STAIN (SURGICAL/DEEP WOUND): Gram Stain: NONE SEEN

## 2021-12-20 ENCOUNTER — Encounter (HOSPITAL_BASED_OUTPATIENT_CLINIC_OR_DEPARTMENT_OTHER): Payer: 59 | Attending: Physician Assistant | Admitting: Physician Assistant

## 2021-12-20 ENCOUNTER — Encounter (HOSPITAL_BASED_OUTPATIENT_CLINIC_OR_DEPARTMENT_OTHER): Payer: 59 | Admitting: Physician Assistant

## 2021-12-20 DIAGNOSIS — E1140 Type 2 diabetes mellitus with diabetic neuropathy, unspecified: Secondary | ICD-10-CM | POA: Insufficient documentation

## 2021-12-20 DIAGNOSIS — E11621 Type 2 diabetes mellitus with foot ulcer: Secondary | ICD-10-CM | POA: Insufficient documentation

## 2021-12-20 DIAGNOSIS — I10 Essential (primary) hypertension: Secondary | ICD-10-CM | POA: Insufficient documentation

## 2021-12-20 DIAGNOSIS — L97812 Non-pressure chronic ulcer of other part of right lower leg with fat layer exposed: Secondary | ICD-10-CM | POA: Insufficient documentation

## 2021-12-20 DIAGNOSIS — L97512 Non-pressure chronic ulcer of other part of right foot with fat layer exposed: Secondary | ICD-10-CM | POA: Diagnosis not present

## 2021-12-20 NOTE — Progress Notes (Addendum)
Donna [name], Donna [name] ([id number]) ?Visit Report for 12/20/2021 ?Chief Complaint Document Details ?Patient Name: Date of Service: ?Donna [name], Donna [name] 12/20/2021 11:15 A M ?Medical Record Number: [id number] ?Patient Account Number: [id number] ?Date of Birth/Sex: Treating RN: ?03/18/1966 (56 y.o. F) Deaton, Bobbi ?Primary Care Provider: Pricilla Holm Other Clinician: ?Referring Provider: ?Treating Provider/Extender: Worthy Keeler ?Pricilla Holm ?Weeks in Treatment: 1 ?Information Obtained from: Patient ?Chief Complaint ?Right great toe ulcer ?Electronic Signature(s) ?Signed: 12/20/2021 11:49:15 AM By: Worthy Keeler PA-C ?Previous Signature: 12/20/2021 11:49:01 AM Version By: Worthy Keeler PA-C ?Entered By: Worthy Keeler on 12/20/2021 11:49:15 ?-------------------------------------------------------------------------------- ?Debridement Details ?Patient Name: Date of Service: ?Donna [name] 12/20/2021 11:15 A M ?Medical Record Number: [id number] ?Patient Account Number: [id number] ?Date of Birth/Sex: Treating RN: ?1966-08-17 (56 y.o. F) Deaton, Bobbi ?Primary Care Provider: Pricilla Holm Other Clinician: ?Referring Provider: ?Treating Provider/Extender: Worthy Keeler ?Pricilla Holm ?Weeks in Treatment: 1 ?Debridement Performed for Assessment: Wound #2 Right T Great ?oe ?Performed By: Physician Worthy Keeler, PA ?Debridement Type: Debridement ?Severity of Tissue Pre Debridement: Fat layer exposed ?Level of Consciousness (Pre-procedure): Awake and Alert ?Pre-procedure Verification/Time Out Yes - 12:02 ?Taken: ?Start Time: 12:02 ?Pain Control: ?Other : Benzocaine 20%spray ?T Area Debrided (L x W): ?otal 2.5 (cm) x 2.5 (cm) = 6.25 (cm?) ?Tissue and other material debrided: Viable, Non-Viable, Callus, Slough, Subcutaneous, Biofilm, Slough ?Level: Skin/Subcutaneous Tissue ?Debridement Description: Excisional ?Instrument: Curette ?Bleeding: Minimum ?Hemostasis Achieved: Pressure ?Procedural Pain: 0 ?Post  Procedural Pain: 0 ?Response to Treatment: Procedure was tolerated well ?Level of Consciousness (Post- Awake and Alert ?procedure): ?Post Debridement Measurements of Total Wound ?Length: (cm) 2 ?Width: (cm) 2.5 ?Depth: (cm) 0.1 ?Volume: (cm?) 0.393 ?Character of Wound/Ulcer Post Debridement: Improved ?Severity of Tissue Post Debridement: Fat layer exposed ?Post Procedure Diagnosis ?Same as Pre-procedure ?Electronic Signature(s) ?Signed: 12/20/2021 1:31:43 PM By: Donavan Burnet CHT EMT BS ?, , ?Signed: 12/20/2021 4:32:22 PM By: Worthy Keeler PA-C ?Signed: 12/21/2021 5:16:25 PM By: Deon Pilling RN, BSN ?Entered By: Donavan Burnet on 12/20/2021 12:06:16 ?-------------------------------------------------------------------------------- ?HPI Details ?Patient Name: Date of Service: ?Donna [name], Donna [name] 12/20/2021 11:15 A M ?Medical Record Number: [id number] ?Patient Account Number: [id number] ?Date of Birth/Sex: Treating RN: ?01-27-1966 (56 y.o. 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 She tells me that since at least last summer she has had a progressive ?callused area that has opened closed but is never really healed. She has a history of calluses on her feet. She is a diabetic with neuropathy. She was referred ?to Dr. Posey Pronto at [facility] who felt that this might be a large plantar wart she was treated with laser for 2 treatments and then subsequently with ?ammonium lactate none of this is apparently made any difference according to the patient. She has a very odd looking area with thick skin and callus and an ?almost crossed like area of open tissue. She also has erythema above this which she says is chronic. The  other interesting part of her recent history is ?recurrent cellulitis on the right lower leg although this is separated from the area we are looking at. She has had trips to see infectious disease has been on ?Keflex for as long as 20 days. This is presumed to be strep. She saw Dr. Gale Journey ?  Past medical history includes type 2 diabetes, recurrent cellulitis of the right leg, right Achilles tendon repair a year ago for spontaneous rupture ?ABI in our clinic was 1.09 on the right ?2/17; patient admitted the clinic last week had a thick callused area on the right lateral heel she is a diabetic with neuropathy. She had thick callus and across ?like area of open wound. I aggressively debrided this area to remove most of the callus and gave her Hydrofera Blue and heel off loader, heel cup and she ?has been wearing this religiously trying to keep the pressure off the area. She comes in today with a cross-like area of injury has healed she has a new open ?area that I do not remember from last time. Still some dry flaking skin and callus but not nearly as much as last week at which time I had to use a scalpel to ?remove this. 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 She came in 2 weeks ago with a very thick hyperkeratotic callused area on her right lateral ?heel. There was a "crossed" shaped area of split tissue with an open wound at the bottom. This had previously been treated as a possible wart without ?improvement. I removed all of the callus when she first came in here. This is mostly epithelialized over still thick skin but nothing like it was. She has 1 small ?open area that has not healed she has been more religious about offloading the area ?10/27/2020 upon evaluation today patient appears to be doing well with regard to her wound although she does still apparently appear to have something still ?open in the base of the wound. Fortunately there is no evidence of infection at this time which is  great news and in general I feel like she is doing decently well ?though she has a lot of callus buildup she is using the offloading shoe for her heel. At home she does not always wear this she tells me. Generally she is using ?flip-flops there. Of note her primary care provider did give her Keflex yesterday for cellulitis of this leg but has nothing to do with the heel location. ?3/17; the patient's area actually looks a lot better. There is no open wound here. Much less callus. She was given a prescription for 40% urea cream I am not ?sure that really is required this week. She is going to require less friction on this area. I think she is going to need a different type of shoe going forward ?Readmission: ?12-13-2028 upon evaluation today patient presents for reevaluation in the clinic she was last seen by Dr. Dellia Nims on 11-03-2020 when she was discharged this ?was due to a crack on her heel. Currently she is actually having an issue with her great toe on the right foot. She tells me that this has been present currently ?for a couple of weeks since around November 22, 2021. She has had a lot of callus over this area in general but not an open wound until that time. Subsequently this ?has more recently opened and has Become a greater concern she is also noted some odor which is disconcerting to her. She tells me that her greatest concern ?is that "she does not lose this toe". Again I completely understand her concern in this regard I definitely think we need to be aggressive in treating this and try ?to get things better. She is very Patent attorney. ?Patient does have a history of diabetes mellitus type 2  with diabetic neuropathy. She also tells me that she does have a history as well of hypertension which ?is pretty well controlled. She has not yet had an x-ray of the area and has not been on any antibiotics. Both are things I think we will need to do today. ?12-20-2021 upon evaluation today patient appears to be doing well  with regard to her toe ulcer all things considered. She has been tolerating the dressing changes ?without complication. I do have her x-ray as well as her culture for review today the culture did so Staphy

## 2021-12-25 ENCOUNTER — Other Ambulatory Visit: Payer: Self-pay | Admitting: Internal Medicine

## 2021-12-25 ENCOUNTER — Other Ambulatory Visit (HOSPITAL_COMMUNITY): Payer: Self-pay

## 2021-12-25 MED ORDER — FREESTYLE LITE TEST VI STRP
ORAL_STRIP | 5 refills | Status: AC
  Filled 2021-12-25: qty 100, 50d supply, fill #0

## 2021-12-26 NOTE — Progress Notes (Signed)
JAYDYN, MENON ([id number]) ?Visit Report for 12/20/2021 ?Arrival Information Details ?Patient Name: Date of Service: ?Donna [name], Donna [name] 12/20/2021 11:15 A M ?Medical Record Number: [id number] ?Patient Account Number: [id number] ?Date of Birth/Sex: Treating RN: ?07-Jan-1966 (56 y.o. F) Deaton, Bobbi ?Primary Care Timarie Labell: Pricilla Holm Other Clinician: ?Referring Asia Favata: ?Treating Kamyiah Colantonio/Extender: Worthy Keeler ?Pricilla Holm ?Weeks in Treatment: 1 ?Visit Information History Since Last Visit ?Added or deleted any medications: No ?Patient Arrived: Ambulatory ?Any new allergies or adverse reactions: No ?Arrival Time: 11:34 ?Had a fall or experienced change in No ?Accompanied By: self ?activities of daily living that may affect ?Transfer Assistance: None ?risk of falls: ?Patient Identification Verified: Yes ?Signs or symptoms of abuse/neglect since last visito No ?Secondary Verification Process Completed: Yes ?Hospitalized since last visit: No ?Patient Requires Transmission-Based Precautions: No ?Implantable device outside of the clinic excluding No ?Patient Has Alerts: No ?cellular tissue based products placed in the center ?since last visit: ?Has Dressing in Place as Prescribed: Yes ?Has Footwear/Offloading in Place as Prescribed: Yes ?Right: Wedge Shoe ?Pain Present Now: No ?Electronic Signature(s) ?Signed: 12/20/2021 1:31:43 PM By: Donavan Burnet CHT EMT BS ?, , ?Entered By: Donavan Burnet on 12/20/2021 11:36:09 ?-------------------------------------------------------------------------------- ?Encounter Discharge Information Details ?Patient Name: Date of Service: ?Donna [name], Donna [name] 12/20/2021 11:15 A M ?Medical Record Number: [id number] ?Patient Account Number: [id number] ?Date of Birth/Sex: Treating RN: ?17-Sep-1965 (56 y.o. F) Sharyn Creamer ?Primary Care Roger Fasnacht: Pricilla Holm Other Clinician: ?Referring Daleysa Kristiansen: ?Treating Dushaun Okey/Extender: Worthy Keeler ?Pricilla Holm ?Weeks in  Treatment: 1 ?Encounter Discharge Information Items Post Procedure Vitals ?Discharge Condition: Stable ?Temperature (F): 98.5 ?Ambulatory Status: Ambulatory ?Pulse (bpm): 69 ?Discharge Destination: Home ?Respiratory Rate (breaths/min): 18 ?Transportation: Private Auto ?Blood Pressure (mmHg): 115/69 ?Accompanied By: self ?Schedule Follow-up Appointment: Yes ?Clinical Summary of Care: Patient Declined ?Electronic Signature(s) ?Signed: 12/26/2021 8:24:25 AM By: Sharyn Creamer RN, BSN ?Entered By: Sharyn Creamer on 12/20/2021 12:26:24 ?-------------------------------------------------------------------------------- ?Lower Extremity Assessment Details ?Patient Name: ?Date of Service: ?Donna [name], Donna [name] 12/20/2021 11:15 A M ?Medical Record Number: [id number] ?Patient Account Number: [id number] ?Date of Birth/Sex: ?Treating RN: ?Sep 24, 1965 (56 y.o. F) Deaton, Bobbi ?Primary Care Demaya Hardge: Pricilla Holm ?Other Clinician: ?Referring Foye Haggart: ?Treating Dakoda Laventure/Extender: Worthy Keeler ?Pricilla Holm ?Weeks in Treatment: 1 ?Edema Assessment ?Assessed: [Left: No] [Right: No] ?Edema: [Left: Ye] [Right: s] ?Calf ?Left: Right: ?Point of Measurement: 28 cm From Medial Instep 37 cm ?Ankle ?Left: Right: ?Point of Measurement: 8 cm From Medial Instep 23 cm ?Vascular Assessment ?Pulses: ?Dorsalis Pedis ?Palpable: [Right:Yes] ?Electronic Signature(s) ?Signed: 12/20/2021 1:31:43 PM By: Donavan Burnet CHT EMT BS ?, , ?Signed: 12/21/2021 5:16:25 PM By: Deon Pilling RN, BSN ?Entered By: Donavan Burnet on 12/20/2021 11:41:25 ?-------------------------------------------------------------------------------- ?Multi-Disciplinary Care Plan Details ?Patient Name: ?Date of Service: ?Donna [name], Donna [name] 12/20/2021 11:15 A M ?Medical Record Number: [id number] ?Patient Account Number: [id number] ?Date of Birth/Sex: ?Treating RN: ?18-Apr-1966 (56 y.o. F) Deaton, Bobbi ?Primary Care Yacob Wilkerson: Pricilla Holm ?Other Clinician: ?Referring  Cache Decoursey: ?Treating Vencil Basnett/Extender: Worthy Keeler ?Pricilla Holm ?Weeks in Treatment: 1 ?Active Inactive ?Pain, Acute or Chronic ?Nursing Diagnoses: ?Pain, acute or chronic: actual or potential ?Potential alteration in comfort, pain ?Goals: ?Patient will verbalize adequate pain control and receive pain control interventions during procedures as needed ?Date Initiated: 12/13/2021 ?Target Resolution Date: 12/22/2021 ?Goal Status: Active ?Patient/caregiver will verbalize comfort level met ?Date Initiated: 12/13/2021 ?Target Resolution Date: 12/22/2021 ?Goal Status: Active ?Interventions: ?Encourage patient to take pain medications as prescribed ?Provide education on pain management ?Reposition patient for comfort ?Treatment Activities: ?Administer pain control measures as  ordered : 12/13/2021 ?Notes: ?Wound/Skin Impairment ?Nursing Diagnoses: ?Knowledge deficit related to ulceration/compromised skin integrity ?Goals: ?Patient/caregiver will verbalize understanding of skin care regimen ?Date Initiated: 12/13/2021 ?Target Resolution Date: 12/22/2021 ?Goal Status: Active ?Interventions: ?Assess patient/caregiver ability to obtain necessary supplies ?Assess patient/caregiver ability to perform ulcer/skin care regimen upon admission and as needed ?Provide education on ulcer and skin care ?Treatment Activities: ?Skin care regimen initiated : 12/13/2021 ?Topical wound management initiated : 12/13/2021 ?Notes: ?Electronic Signature(s) ?Signed: 12/20/2021 1:31:43 PM By: Donavan Burnet CHT EMT BS ?, , ?Signed: 12/21/2021 5:16:25 PM By: Deon Pilling RN, BSN ?Entered By: Donavan Burnet on 12/20/2021 11:44:53 ?-------------------------------------------------------------------------------- ?Pain Assessment Details ?Patient Name: ?Date of Service: ?Donna [name], Donna [name] 12/20/2021 11:15 A M ?Medical Record Number: [id number] ?Patient Account Number: [id number] ?Date of Birth/Sex: ?Treating RN: ?1966-05-05 (56 y.o. F) Deaton,  Bobbi ?Primary Care Tola Meas: Pricilla Holm ?Other Clinician: ?Referring Doris Gruhn: ?Treating Farren Nelles/Extender: Worthy Keeler ?Pricilla Holm ?Weeks in Treatment: 1 ?Active Problems ?Location of Pain Severity and Description of Pain ?Patient Has Paino No ?Site Locations ?Pain Management and Medication ?Current Pain Management: ?Electronic Signature(s) ?Signed: 12/20/2021 1:31:43 PM By: Donavan Burnet CHT EMT BS ?, , ?Signed: 12/21/2021 5:16:25 PM By: Deon Pilling RN, BSN ?Entered By: Donavan Burnet on 12/20/2021 11:38:44 ?-------------------------------------------------------------------------------- ?Patient/Caregiver Education Details ?Patient Name: ?Date of Service: ?Donna [name], Donna [name]. 5/3/2023andnbsp11:15 A M ?Medical Record Number: [id number] ?Patient Account Number: [id number] ?Date of Birth/Gender: ?Treating RN: ?1966-03-31 (56 y.o. F) Deaton, Bobbi ?Primary Care Physician: Pricilla Holm ?Other Clinician: ?Referring Physician: ?Treating Physician/Extender: Worthy Keeler ?Pricilla Holm ?Weeks in Treatment: 1 ?Education Assessment ?Education Provided To: ?Patient ?Education Topics Provided ?Pain: ?Methods: Explain/Verbal ?Responses: State content correctly ?Wound/Skin Impairment: ?Methods: Explain/Verbal ?Responses: State content correctly ?Electronic Signature(s) ?Signed: 12/20/2021 1:31:43 PM By: Donavan Burnet CHT EMT BS ?, , ?Entered By: Donavan Burnet on 12/20/2021 11:45:12 ?-------------------------------------------------------------------------------- ?Wound Assessment Details ?Patient Name: ?Date of Service: ?Donna [name], Donna [name] 12/20/2021 11:15 A M ?Medical Record Number: [id number] ?Patient Account Number: [id number] ?Date of Birth/Sex: ?Treating RN: ?1965/10/16 (56 y.o. F) Deaton, Bobbi ?Primary Care Domonique Cothran: Pricilla Holm ?Other Clinician: ?Referring Nancylee Gaines: ?Treating Dinia Joynt/Extender: Worthy Keeler ?Pricilla Holm ?Weeks in Treatment: 1 ?Wound  Status ?Wound Number: 2 ?Primary Etiology: Diabetic Wound/Ulcer of the Lower Extremity ?Wound Location: Right T Great ?oe Wound Status: Open ?Wounding Event: Blister ?Comorbid History: Hypertension, Type II Diabetes, Neuropathy ?Dat

## 2021-12-27 ENCOUNTER — Encounter (HOSPITAL_BASED_OUTPATIENT_CLINIC_OR_DEPARTMENT_OTHER): Payer: 59 | Admitting: Physician Assistant

## 2021-12-27 DIAGNOSIS — E1140 Type 2 diabetes mellitus with diabetic neuropathy, unspecified: Secondary | ICD-10-CM | POA: Diagnosis not present

## 2021-12-27 DIAGNOSIS — E11621 Type 2 diabetes mellitus with foot ulcer: Secondary | ICD-10-CM | POA: Diagnosis not present

## 2021-12-27 DIAGNOSIS — L97812 Non-pressure chronic ulcer of other part of right lower leg with fat layer exposed: Secondary | ICD-10-CM | POA: Diagnosis not present

## 2021-12-27 DIAGNOSIS — L97512 Non-pressure chronic ulcer of other part of right foot with fat layer exposed: Secondary | ICD-10-CM | POA: Diagnosis not present

## 2021-12-27 DIAGNOSIS — I10 Essential (primary) hypertension: Secondary | ICD-10-CM | POA: Diagnosis not present

## 2021-12-27 NOTE — Progress Notes (Signed)
Donna [name], Donna [name] ([id number]) ?Visit Report for 12/27/2021 ?Arrival Information Details ?Patient Name: Date of Service: ?Donna [name], Donna [name] 12/27/2021 12:45 PM ?Medical Record Number: [id number] ?Patient Account Number: [id number] ?Date of Birth/Sex: Treating RN: ?11-20-1965 (56 y.o. F) Deaton, Bobbi ?Primary Care Delmi Fulfer: Pricilla Holm Other Clinician: ?Referring Tyianna Menefee: ?Treating Laverne Klugh/Extender: Worthy Keeler ?Pricilla Holm ?Weeks in Treatment: 2 ?Visit Information History Since Last Visit ?Added or deleted any medications: No ?Patient Arrived: Ambulatory ?Any new allergies or adverse reactions: No ?Arrival Time: 12:58 ?Had a fall or experienced change in No ?Accompanied By: self ?activities of daily living that may affect ?Transfer Assistance: None ?risk of falls: ?Patient Identification Verified: Yes ?Signs or symptoms of abuse/neglect since last visito No ?Secondary Verification Process Completed: Yes ?Hospitalized since last visit: No ?Patient Requires Transmission-Based Precautions: No ?Implantable device outside of the clinic excluding No ?Patient Has Alerts: No ?cellular tissue based products placed in the center ?since last visit: ?Has Dressing in Place as Prescribed: Yes ?Has Footwear/Offloading in Place as Prescribed: Yes ?Right: Wedge Shoe ?Pain Present Now: Yes ?Electronic Signature(s) ?Signed: 12/27/2021 6:15:37 PM By: Deon Pilling RN, BSN ?Entered By: Deon Pilling on 12/27/2021 12:58:41 ?-------------------------------------------------------------------------------- ?Lower Extremity Assessment Details ?Patient Name: Date of Service: ?Donna [name], Donna [name] 12/27/2021 12:45 PM ?Medical Record Number: [id number] ?Patient Account Number: [id number] ?Date of Birth/Sex: Treating RN: ?06-Aug-1966 (56 y.o. F) Deaton, Bobbi ?Primary Care Triston Lisanti: Pricilla Holm Other Clinician: ?Referring Jwan Hornbaker: ?Treating Emmalyn Hinson/Extender: Worthy Keeler ?Pricilla Holm ?Weeks in Treatment: 2 ?Edema  Assessment ?Assessed: [Left: No] [Right: Yes] ?Edema: [Left: N] [Right: o] ?Calf ?Left: Right: ?Point of Measurement: 28 cm From Medial Instep 36 cm ?Ankle ?Left: Right: ?Point of Measurement: 8 cm From Medial Instep 22 cm ?Vascular Assessment ?Pulses: ?Dorsalis Pedis ?Palpable: [Right:Yes] ?Electronic Signature(s) ?Signed: 12/27/2021 6:15:37 PM By: Deon Pilling RN, BSN ?Entered By: Deon Pilling on 12/27/2021 13:03:48 ?-------------------------------------------------------------------------------- ?Multi-Disciplinary Care Plan Details ?Patient Name: ?Date of Service: ?Donna [name], Donna [name] 12/27/2021 12:45 PM ?Medical Record Number: [id number] ?Patient Account Number: [id number] ?Date of Birth/Sex: ?Treating RN: ?22-Mar-1966 (56 y.o. F) Deaton, Bobbi ?Primary Care Sopheap Basic: Pricilla Holm ?Other Clinician: ?Referring Evadene Wardrip: ?Treating Bennett Ram/Extender: Worthy Keeler ?Pricilla Holm ?Weeks in Treatment: 2 ?Active Inactive ?Pain, Acute or Chronic ?Nursing Diagnoses: ?Pain, acute or chronic: actual or potential ?Potential alteration in comfort, pain ?Goals: ?Patient will verbalize adequate pain control and receive pain control interventions during procedures as needed ?Date Initiated: 12/13/2021 ?Target Resolution Date: 01/19/2022 ?Goal Status: Active ?Patient/caregiver will verbalize comfort level met ?Date Initiated: 12/13/2021 ?Target Resolution Date: 01/19/2022 ?Goal Status: Active ?Interventions: ?Encourage patient to take pain medications as prescribed ?Provide education on pain management ?Reposition patient for comfort ?Treatment Activities: ?Administer pain control measures as ordered : 12/13/2021 ?Notes: ?Wound/Skin Impairment ?Nursing Diagnoses: ?Knowledge deficit related to ulceration/compromised skin integrity ?Goals: ?Patient/caregiver will verbalize understanding of skin care regimen ?Date Initiated: 12/13/2021 ?Target Resolution Date: 01/19/2022 ?Goal Status: Active ?Interventions: ?Assess  patient/caregiver ability to obtain necessary supplies ?Assess patient/caregiver ability to perform ulcer/skin care regimen upon admission and as needed ?Provide education on ulcer and skin care ?Treatment Activities: ?Skin care regimen initiated : 12/13/2021 ?Topical wound management initiated : 12/13/2021 ?Notes: ?Electronic Signature(s) ?Signed: 12/27/2021 6:15:37 PM By: Deon Pilling RN, BSN ?Signed: 12/27/2021 6:15:37 PM By: Deon Pilling RN, BSN ?Entered By: Deon Pilling on 12/27/2021 13:18:19 ?-------------------------------------------------------------------------------- ?Pain Assessment Details ?Patient Name: ?Date of Service: ?Donna [name], Donna [name] 12/27/2021 12:45 PM ?Medical Record Number: [id number] ?Patient Account Number: [id number] ?Date of Birth/Sex: ?Treating RN: ?[birth year] (56 y.o. F) Deaton, Bobbi ?  Primary Care Etrulia Zarr: Pricilla Holm ?Other Clinician: ?Referring Renell Coaxum: ?Treating Abrey Bradway/Extender: Worthy Keeler ?Pricilla Holm ?Weeks in Treatment: 2 ?Active Problems ?Location of Pain Severity and Description of Pain ?Patient Has Paino Yes ?Site Locations ?Pain Location: ?Pain in Ulcers ?Rate the pain. ?Current Pain Level: 8 ?Pain Management and Medication ?Current Pain Management: ?Medication: No ?Cold Application: No ?Rest: No ?Massage: No ?Activity: No ?T.E.N.S.: No ?Heat Application: No ?Leg drop or elevation: No ?Is the Current Pain Management Adequate: Adequate ?How does your wound impact your activities of daily livingo ?Sleep: No ?Bathing: No ?Appetite: No ?Relationship With Others: No ?Bladder Continence: No ?Emotions: No ?Bowel Continence: No ?Work: No ?Toileting: No ?Drive: No ?Dressing: No ?Hobbies: No ?Electronic Signature(s) ?Signed: 12/27/2021 6:15:37 PM By: Deon Pilling RN, BSN ?Entered By: Deon Pilling on 12/27/2021 12:59:13 ?-------------------------------------------------------------------------------- ?Patient/Caregiver Education Details ?Patient Name: ?Date of  Service: ?Donna [name], Donna [name] 5/10/2023andnbsp12:45 PM ?Medical Record Number: [id number] ?Patient Account Number: [id number] ?Date of Birth/Gender: ?Treating RN: ?07-Sep-1965 (56 y.o. F) Deaton, Bobbi ?Primary Care Physician: Pricilla Holm ?Other Clinician: ?Referring Physician: ?Treating Physician/Extender: Worthy Keeler ?Pricilla Holm ?Weeks in Treatment: 2 ?Education Assessment ?Education Provided To: ?Patient ?Education Topics Provided ?Wound/Skin Impairment: ?Handouts: Skin Care Do's and Dont's ?Methods: Explain/Verbal ?Responses: Reinforcements needed ?Electronic Signature(s) ?Signed: 12/27/2021 6:15:37 PM By: Deon Pilling RN, BSN ?Entered By: Deon Pilling on 12/27/2021 13:18:44 ?-------------------------------------------------------------------------------- ?Wound Assessment Details ?Patient Name: ?Date of Service: ?Donna [name], Donna [name] 12/27/2021 12:45 PM ?Medical Record Number: [id number] ?Patient Account Number: [id number] ?Date of Birth/Sex: ?Treating RN: ?1966/05/29 (56 y.o. F) Deaton, Bobbi ?Primary Care Kida Digiulio: Pricilla Holm ?Other Clinician: ?Referring Zaccheus Edmister: ?Treating Clary Boulais/Extender: Worthy Keeler ?Pricilla Holm ?Weeks in Treatment: 2 ?Wound Status ?Wound Number: 2 ?Primary Etiology: Diabetic Wound/Ulcer of the Lower Extremity ?Wound Location: Right T Great ?oe Wound Status: Open ?Wounding Event: Blister ?Comorbid History: Hypertension, Type II Diabetes, Neuropathy ?Date Acquired: 11/22/2021 ?Weeks Of Treatment: 2 ?Clustered Wound: No ?Photos ?Photo Uploaded By: Donavan Burnet on 12/27/2021 17:17:43 ?Wound Measurements ?Length: (cm) 1.4 ?Width: (cm) 1.3 ?Depth: (cm) 0.1 ?Area: (cm?) 1.429 ?Volume: (cm?) 0.143 ?% Reduction in Area: 60.4% ?% Reduction in Volume: 60.4% ?Epithelialization: Small (1-33%) ?Tunneling: No ?Undermining: No ?Wound Description ?Classification: Grade 2 ?Wound Margin: Distinct, outline attached ?Exudate Amount: Medium ?Exudate Type:  Serosanguineous ?Exudate Color: red, brown ?Foul Odor After Cleansing: No ?Slough/Fibrino Yes ?Wound Bed ?Granulation Amount: Small (1-33%) Exposed Structure ?Granulation Quality: Red, Pink, Hyper-granulation ?Fascia Exposed: No ?Necroti

## 2021-12-27 NOTE — Progress Notes (Addendum)
JANIFER, GIESELMAN ([id number]) ?Visit Report for 12/27/2021 ?Chief Complaint Document Details ?Patient Name: Date of Service: ?Donna [name], Donna [name] 12/27/2021 12:45 PM ?Medical Record Number: [id number] ?Patient Account Number: [id number] ?Date of Birth/Sex: Treating RN: ?1965/09/27 (56 y.o. F) Deaton, Bobbi ?Primary Care Provider: Hillard Danker Other Clinician: ?Referring Provider: ?Treating Provider/Extender: Lenda Kelp ?Hillard Danker ?Weeks in Treatment: 2 ?Information Obtained from: Patient ?Chief Complaint ?Right great toe ulcer ?Electronic Signature(s) ?Signed: 12/27/2021 1:10:37 PM By: Lenda Kelp PA-C ?Entered By: Lenda Kelp on 12/27/2021 13:10:37 ?-------------------------------------------------------------------------------- ?Debridement Details ?Patient Name: Date of Service: ?Donna [name], Donna [name] 12/27/2021 12:45 PM ?Medical Record Number: [id number] ?Patient Account Number: [id number] ?Date of Birth/Sex: Treating RN: ?09/19/1965 (56 y.o. F) Deaton, Bobbi ?Primary Care Provider: Hillard Danker Other Clinician: ?Referring Provider: ?Treating Provider/Extender: Lenda Kelp ?Hillard Danker ?Weeks in Treatment: 2 ?Debridement Performed for Assessment: Wound #2 Right T Great ?oe ?Performed By: Physician Lenda Kelp, PA ?Debridement Type: Debridement ?Severity of Tissue Pre Debridement: Fat layer exposed ?Level of Consciousness (Pre-procedure): Awake and Alert ?Pre-procedure Verification/Time Out Yes - 13:18 ?Taken: ?Start Time: 13:19 ?Pain Control: Lidocaine 5% topical ointment ?T Area Debrided (L x W): ?otal 1.4 (cm) x 1.3 (cm) = 1.82 (cm?) ?Viable, Non-Viable, Callus, Slough, Subcutaneous, Skin: Dermis , Skin: Epidermis, Fibrin/Exudate, Slough, Hyper- ?Tissue and other material debrided: ?granulation ?Level: Skin/Subcutaneous Tissue ?Debridement Description: Excisional ?Instrument: Curette ?Bleeding: Minimum ?Hemostasis Achieved: Pressure ?End Time: 13:23 ?Procedural Pain:  0 ?Post Procedural Pain: 0 ?Response to Treatment: Procedure was tolerated well ?Level of Consciousness (Post- Awake and Alert ?procedure): ?Post Debridement Measurements of Total Wound ?Length: (cm) 1.4 ?Width: (cm) 1.3 ?Depth: (cm) 0.1 ?Volume: (cm?) 0.143 ?Character of Wound/Ulcer Post Debridement: Improved ?Severity of Tissue Post Debridement: Fat layer exposed ?Post Procedure Diagnosis ?Same as Pre-procedure ?Electronic Signature(s) ?Signed: 12/27/2021 6:03:33 PM By: Lenda Kelp PA-C ?Signed: 12/27/2021 6:15:37 PM By: Shawn Stall RN, BSN ?Entered By: Shawn Stall on 12/27/2021 13:23:30 ?-------------------------------------------------------------------------------- ?HPI Details ?Patient Name: Date of Service: ?Donna [name], Donna [name] 12/27/2021 12:45 PM ?Medical Record Number: [id number] ?Patient Account Number: [id number] ?Date of Birth/Sex: Treating RN: ?1965-11-20 (56 y.o. 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 She tells me that since at least last summer she has had a progressive ?callused area that has opened closed but is never really healed. She has a history of calluses on her feet. She is a diabetic with neuropathy. She was referred ?to Dr. Allena Katz at [facility] who felt that this might be a large plantar wart she was treated with laser for 2 treatments and then subsequently with ?ammonium lactate none of this is apparently made any difference according to the patient. She has a very odd looking area with thick skin and callus and an ?almost crossed like area of open tissue. She also has erythema above this which she says is chronic. The other interesting part of her recent history is ?recurrent  cellulitis on the right lower leg although this is separated from the area we are looking at. She has had trips to see infectious disease has been on ?Keflex for as long as 20 days. This is presumed to be strep. She saw Dr. Renold Don ?Past medical history includes type 2 diabetes, recurrent cellulitis of the right leg, right Achilles tendon  repair a year ago for spontaneous rupture ?ABI in our clinic was 1.09 on the right ?2/17; patient admitted the clinic last week had a thick callused area on the right lateral heel she is a diabetic with neuropathy. She had thick callus and across ?like area of open wound. I aggressively debrided this area to remove most of the callus and gave her Hydrofera Blue and heel off loader, heel cup and she ?has been wearing this religiously trying to keep the pressure off the area. She comes in today with a cross-like area of injury has healed she has a new open ?area that I do not remember from last time. Still some dry flaking skin and callus but not nearly as much as last week at which time I had to use a scalpel to ?remove this. 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 She came in 2 weeks ago with a very thick hyperkeratotic callused area on her right lateral ?heel. There was a "crossed" shaped area of split tissue with an open wound at the bottom. This had previously been treated as a possible wart without ?improvement. I removed all of the callus when she first came in here. This is mostly epithelialized over still thick skin but nothing like it was. She has 1 small ?open area that has not healed she has been more religious about offloading the area ?10/27/2020 upon evaluation today patient appears to be doing well with regard to her wound although she does still apparently appear to have something still ?open in the base of the wound. Fortunately there is no evidence of infection at this time which is great news and in general I feel like she is doing decently  well ?though she has a lot of callus buildup she is using the offloading shoe for her heel. At home she does not always wear this she tells me. Generally she is using ?flip-flops there. Of note her primary care provider did give her Keflex yesterday for cellulitis of this leg but has nothing to do with the heel location. ?3/17; the patient's area actually looks a lot better. There is no open wound here. Much less callus. She was given a prescription for 40% urea cream I am not ?sure that really is required this week. She is going to require less friction on this area. I think she is going to need a different type of shoe going forward ?Readmission: ?12-13-2028 upon evaluation today patient presents for reevaluation in the clinic she was last seen by Dr. Leanord Hawking on 11-03-2020 when she was discharged this ?was due to a crack on her heel. Currently she is actually having an issue with her great toe on the right foot. She tells me that this has been present currently ?for a couple of weeks since around November 22, 2021. She has had a lot of callus over this area in general but not an open wound until that time. Subsequently this ?has more recently opened and has Become a greater concern she is also noted some odor which is disconcerting to her. She tells me that her greatest concern ?is that "she does not lose this toe". Again I completely understand her concern in this regard I definitely think we need to be aggressive in treating this and try ?to get things better. She is very Adult nurse. ?Patient does have a history of diabetes mellitus type 2 with diabetic neuropathy. She also tells me that she does have a history as well of  hypertension which ?is pretty well controlled. She has not yet had an x-ray of the area and has not been on any antibiotics. Both are things I think we will need to do today. ?12-20-2021 upon evaluation today patient appears to be doing well with regard to her toe ulcer all things considered. She has  been tolerating the dressing changes ?without complication. I do have her x-ray as well as her culture for review today the culture did so Staphylococcus aureus no MRSA noted which was great ?news susceptibl

## 2022-01-03 ENCOUNTER — Encounter (HOSPITAL_BASED_OUTPATIENT_CLINIC_OR_DEPARTMENT_OTHER): Payer: 59 | Admitting: Physician Assistant

## 2022-01-03 DIAGNOSIS — E11621 Type 2 diabetes mellitus with foot ulcer: Secondary | ICD-10-CM | POA: Diagnosis not present

## 2022-01-03 DIAGNOSIS — I10 Essential (primary) hypertension: Secondary | ICD-10-CM | POA: Diagnosis not present

## 2022-01-03 DIAGNOSIS — E1140 Type 2 diabetes mellitus with diabetic neuropathy, unspecified: Secondary | ICD-10-CM | POA: Diagnosis not present

## 2022-01-03 DIAGNOSIS — L97812 Non-pressure chronic ulcer of other part of right lower leg with fat layer exposed: Secondary | ICD-10-CM | POA: Diagnosis not present

## 2022-01-03 DIAGNOSIS — L97512 Non-pressure chronic ulcer of other part of right foot with fat layer exposed: Secondary | ICD-10-CM | POA: Diagnosis not present

## 2022-01-03 NOTE — Progress Notes (Addendum)
Donna [name], Donna [name] ([id number]) ?Visit Report for 01/03/2022 ?Chief Complaint Document Details ?Patient Name: Date of Service: ?Donna [name], Donna [name] 01/03/2022 3:00 PM ?Medical Record Number: [id number] ?Patient Account Number: [id number] ?Date of Birth/Sex: Treating RN: ?05-15-1966 (56 y.o. F) Deaton, Bobbi ?Primary Care Provider: Hillard Danker Other Clinician: ?Referring Provider: ?Treating Provider/Extender: Lenda Kelp ?Hillard Danker ?Weeks in Treatment: 3 ?Information Obtained from: Patient ?Chief Complaint ?Right great toe ulcer ?Electronic Signature(s) ?Signed: 01/03/2022 3:02:25 PM By: Lenda Kelp PA-C ?Entered By: Lenda Kelp on 01/03/2022 15:02:25 ?-------------------------------------------------------------------------------- ?Debridement Details ?Patient Name: Date of Service: ?Donna [name], Donna [name] 01/03/2022 3:00 PM ?Medical Record Number: [id number] ?Patient Account Number: [id number] ?Date of Birth/Sex: Treating RN: ?June 17, 1966 (56 y.o. F) Deaton, Bobbi ?Primary Care Provider: Hillard Danker Other Clinician: ?Referring Provider: ?Treating Provider/Extender: Lenda Kelp ?Hillard Danker ?Weeks in Treatment: 3 ?Debridement Performed for Assessment: Wound #2 Right T Great ?oe ?Performed By: Physician Lenda Kelp, PA ?Debridement Type: Debridement ?Severity of Tissue Pre Debridement: Fat layer exposed ?Level of Consciousness (Pre-procedure): Awake and Alert ?Pre-procedure Verification/Time Out Yes - 15:15 ?Taken: ?Start Time: 15:16 ?Pain Control: Lidocaine 5% topical ointment ?T Area Debrided (L x W): ?otal 1.5 (cm) x 1 (cm) = 1.5 (cm?) ?Tissue and other material debrided: ?Viable, Non-Viable, Callus, Slough, Subcutaneous, Skin: Dermis , Skin: Epidermis, Fibrin/Exudate, Slough ?Level: Skin/Subcutaneous Tissue ?Debridement Description: Excisional ?Instrument: Curette ?Bleeding: Minimum ?Hemostasis Achieved: Pressure ?End Time: 15:21 ?Procedural Pain: 0 ?Post Procedural Pain:  0 ?Response to Treatment: Procedure was tolerated well ?Level of Consciousness (Post- Awake and Alert ?procedure): ?Post Debridement Measurements of Total Wound ?Length: (cm) 1.2 ?Width: (cm) 0.9 ?Depth: (cm) 0.1 ?Volume: (cm?) 0.085 ?Character of Wound/Ulcer Post Debridement: Improved ?Severity of Tissue Post Debridement: Fat layer exposed ?Post Procedure Diagnosis ?Same as Pre-procedure ?Electronic Signature(s) ?Signed: 01/03/2022 4:18:49 PM By: Lenda Kelp PA-C ?Signed: 01/03/2022 4:50:23 PM By: Shawn Stall RN, BSN ?Entered By: Shawn Stall on 01/03/2022 15:22:14 ?-------------------------------------------------------------------------------- ?HPI Details ?Patient Name: Date of Service: ?Donna [name], Donna [name] 01/03/2022 3:00 PM ?Medical Record Number: [id number] ?Patient Account Number: [id number] ?Date of Birth/Sex: Treating RN: ?17-Sep-1965 (56 y.o. 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 She tells me that since at least last summer she has had a progressive ?callused area that has opened closed but is never really healed. She has a history of calluses on her feet. She is a diabetic with neuropathy. She was referred ?to Dr. Allena Katz at [facility] who felt that this might be a large plantar wart she was treated with laser for 2 treatments and then subsequently with ?ammonium lactate none of this is apparently made any difference according to the patient. She has a very odd looking area with thick skin and callus and an ?almost crossed like area of open tissue. She also has erythema above this which she says is chronic. The other interesting part of her recent history is ?recurrent cellulitis on the right lower  leg although this is separated from the area we are looking at. She has had trips to see infectious disease has been on ?Keflex for as long as 20 days. This is presumed to be strep. She saw Dr. Renold Don ?Past medical history includes type 2 diabetes, recurrent cellulitis of the right leg, right Achilles tendon repair a  year ago for spontaneous rupture ?ABI in our clinic was 1.09 on the right ?2/17; patient admitted the clinic last week had a thick callused area on the right lateral heel she is a diabetic with neuropathy. She had thick callus and across ?like area of open wound. I aggressively debrided this area to remove most of the callus and gave her Hydrofera Blue and heel off loader, heel cup and she ?has been wearing this religiously trying to keep the pressure off the area. She comes in today with a cross-like area of injury has healed she has a new open ?area that I do not remember from last time. Still some dry flaking skin and callus but not nearly as much as last week at which time I had to use a scalpel to ?remove this. 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 She came in 2 weeks ago with a very thick hyperkeratotic callused area on her right lateral ?heel. There was a "crossed" shaped area of split tissue with an open wound at the bottom. This had previously been treated as a possible wart without ?improvement. I removed all of the callus when she first came in here. This is mostly epithelialized over still thick skin but nothing like it was. She has 1 small ?open area that has not healed she has been more religious about offloading the area ?10/27/2020 upon evaluation today patient appears to be doing well with regard to her wound although she does still apparently appear to have something still ?open in the base of the wound. Fortunately there is no evidence of infection at this time which is great news and in general I feel like she is doing decently well ?though she has a lot of  callus buildup she is using the offloading shoe for her heel. At home she does not always wear this she tells me. Generally she is using ?flip-flops there. Of note her primary care provider did give her Keflex yesterday for cellulitis of this leg but has nothing to do with the heel location. ?3/17; the patient's area actually looks a lot better. There is no open wound here. Much less callus. She was given a prescription for 40% urea cream I am not ?sure that really is required this week. She is going to require less friction on this area. I think she is going to need a different type of shoe going forward ?Readmission: ?12-13-2028 upon evaluation today patient presents for reevaluation in the clinic she was last seen by Dr. Leanord Hawkingobson on 11-03-2020 when she was discharged this ?was due to a crack on her heel. Currently she is actually having an issue with her great toe on the right foot. She tells me that this has been present currently ?for a couple of weeks since around November 22, 2021. She has had a lot of callus over this area in general but not an open wound until that time. Subsequently this ?has more recently opened and has Become a greater concern she is also noted some odor which is disconcerting to her. She tells me that her greatest concern ?is that "she does not lose this toe". Again I completely understand her concern in this regard I definitely think we need to be aggressive in treating this and try ?to get things better. She is very Adult nurseappreciative. ?Patient does have a history of diabetes mellitus type 2 with diabetic neuropathy. She also tells me that she does have a history as well of hypertension which ?  is pretty well controlled. She has not yet had an x-ray of the area and has not been on any antibiotics. Both are things I think we will need to do today. ?12-20-2021 upon evaluation today patient appears to be doing well with regard to her toe ulcer all things considered. She has been tolerating the dressing  changes ?without complication. I do have her x-ray as well as her culture for review today the culture did so Staphylococcus aureus no MRSA noted which was great ?news susceptible to doxycycline which is w

## 2022-01-10 ENCOUNTER — Encounter (HOSPITAL_BASED_OUTPATIENT_CLINIC_OR_DEPARTMENT_OTHER): Payer: 59 | Admitting: Physician Assistant

## 2022-01-10 DIAGNOSIS — I10 Essential (primary) hypertension: Secondary | ICD-10-CM | POA: Diagnosis not present

## 2022-01-10 DIAGNOSIS — E1140 Type 2 diabetes mellitus with diabetic neuropathy, unspecified: Secondary | ICD-10-CM | POA: Diagnosis not present

## 2022-01-10 DIAGNOSIS — E11621 Type 2 diabetes mellitus with foot ulcer: Secondary | ICD-10-CM | POA: Diagnosis not present

## 2022-01-10 DIAGNOSIS — L97812 Non-pressure chronic ulcer of other part of right lower leg with fat layer exposed: Secondary | ICD-10-CM | POA: Diagnosis not present

## 2022-01-10 DIAGNOSIS — L97512 Non-pressure chronic ulcer of other part of right foot with fat layer exposed: Secondary | ICD-10-CM | POA: Diagnosis not present

## 2022-01-10 NOTE — Progress Notes (Signed)
Donna [name], Donna [name] ([id number]) Visit Report for 01/10/2022 Chief Complaint Document Details Patient Name: Date of Service: Donna [name], Donna [name] 01/10/2022 8:15 A M Medical Record Number: [id number] Patient Account Number: [id number] Date of Birth/Sex: Treating RN: [birth year] (56 y.o. Donna [name] Primary Care Provider: Pricilla Holm Other Clinician: Referring Provider: Treating Provider/Extender: Dreama Saa in Treatment: 4 Information Obtained from: Patient Chief Complaint Right great toe ulcer Electronic Signature(s) Signed: 01/10/2022 9:33:21 AM By: Worthy Keeler PA-C Entered By: Worthy Keeler on 01/10/2022 09:33:21 -------------------------------------------------------------------------------- Debridement Details Patient Name: Date of Service: Donna [name], Donna [name] 01/10/2022 8:15 A M Medical Record Number: [id number] Patient Account Number: [id number] Date of Birth/Sex: Treating RN: Sep 25, 1965 (56 y.o. Donna [name], Donna [name] Primary Care Provider: Pricilla Holm Other Clinician: Referring Provider: Treating Provider/Extender: Dreama Saa in Treatment: 4 Debridement Performed for Assessment: Wound #2 Right T Great oe Performed By: Physician Worthy Keeler, PA Debridement Type: Debridement Severity of Tissue Pre Debridement: Fat layer exposed Level of Consciousness (Pre-procedure): Awake and Alert Pre-procedure Verification/Time Out Yes - 08:35 Taken: Start Time: 08:36 Pain Control: Lidocaine 5% topical ointment T Area Debrided (L x W): otal 2 (cm) x 2 (cm) = 4 (cm) Tissue and other material debrided: Viable, Non-Viable, Callus, Slough, Subcutaneous, Biofilm, Fibrin/Exudate, Slough Level: Skin/Subcutaneous Tissue Debridement Description: Excisional Instrument: Curette Bleeding: Minimum Hemostasis Achieved: Pressure End Time: 08:42 Procedural Pain: 0 Post Procedural Pain: 0 Response to Treatment:  Procedure was tolerated well Level of Consciousness (Post- Awake and Alert procedure): Post Debridement Measurements of Total Wound Length: (cm) 1.1 Width: (cm) 1 Depth: (cm) 0.1 Volume: (cm) 0.086 Character of Wound/Ulcer Post Debridement: Improved Severity of Tissue Post Debridement: Fat layer exposed Post Procedure Diagnosis Same as Pre-procedure Electronic Signature(s) Signed: 01/10/2022 2:56:41 PM By: Deon Pilling RN, BSN Signed: 01/10/2022 5:33:59 PM By: Worthy Keeler PA-C Entered By: Deon Pilling on 01/10/2022 08:42:37 -------------------------------------------------------------------------------- HPI Details Patient Name: Date of Service: Donna [name] 01/10/2022 8:15 A M Medical Record Number: [id number] Patient Account Number: [id number] Date of Birth/Sex: Treating RN: [birth year] (56 y.o. 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 She tells me that since at least last summer she has had a progressive callused area that has opened closed but is never really healed. She has a history of calluses on her feet. She is a diabetic with neuropathy. She was referred to Dr. Posey Pronto at [facility] who felt that this might be a large plantar wart she was treated with laser for 2 treatments and then subsequently with ammonium lactate none of this is apparently made any difference according to the patient. She has a very odd looking area with thick skin and callus and an almost crossed like area of open tissue. She also has erythema above this which she says is chronic. The other interesting part of her recent history is recurrent cellulitis on the right lower leg although this is  separated from the area we are looking at. She has had trips to see infectious disease has been on Keflex for as long as 20 days. This is presumed to be strep. She saw Dr. Gale Journey Past medical history includes type 2 diabetes, recurrent cellulitis of the right leg, right Achilles tendon repair a year  ago for spontaneous rupture ABI in our clinic was 1.09 on the right 2/17; patient admitted the clinic last week had a thick callused area on the right lateral heel she is a diabetic with neuropathy. She had thick callus and across like area of open wound. I aggressively debrided this area to remove most of the callus and gave her Hydrofera Blue and heel off loader, heel cup and she has been wearing this religiously trying to keep the pressure off the area. She comes in today with a cross-like area of injury has healed she has a new open area that I do not remember from last time. Still some dry flaking skin and callus but not nearly as much as last week at which time I had to use a scalpel to remove this. 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 She came in 2 weeks ago with a very thick hyperkeratotic callused area on her right lateral heel. There was a "crossed" shaped area of split tissue with an open wound at the bottom. This had previously been treated as a possible wart without improvement. I removed all of the callus when she first came in here. This is mostly epithelialized over still thick skin but nothing like it was. She has 1 small open area that has not healed she has been more religious about offloading the area 10/27/2020 upon evaluation today patient appears to be doing well with regard to her wound although she does still apparently appear to have something still open in the base of the wound. Fortunately there is no evidence of infection at this time which is great news and in general I feel like she is doing decently well though she has a lot of callus buildup she is  using the offloading [name] for her heel. At home she does not always wear this she tells me. Generally she is using flip-flops there. Of note her primary care provider did give her Keflex yesterday for cellulitis of this leg but has nothing to do with the heel location. 3/17; the patient's area actually looks a lot better. There is no open wound here. Much less callus. She was given a prescription for 40% urea cream I am not sure that really is required this week. She is going to require less friction on this area. I think she is going to need a different type of [name] going forward Readmission: 12-13-2028 upon evaluation today patient presents for reevaluation in the clinic she was last seen by Dr. Dellia Nims on 11-03-2020 when she was discharged this was due to a crack on her heel. Currently she is actually having an issue with her great toe on the right foot. She tells me that this has been present currently for a couple of weeks since around November 22, 2021. She has had a lot of callus over this area in general but not an open wound until that time. Subsequently this has more recently opened and has Become a greater concern she is also noted some odor which is disconcerting to her. She tells me that her greatest concern is that "she does not lose this toe". Again I completely understand her concern in this regard I definitely think we need to be aggressive in treating this and try to get things better. She is very Patent attorney. Patient does have a history of diabetes mellitus type 2 with diabetic neuropathy. She also tells me that she does have a history as well of hypertension which is  pretty well controlled. She has not yet had an x-ray of the area and has not been on any antibiotics. Both are things I think we will need to do today. 12-20-2021 upon evaluation today patient appears to be doing well with regard to her toe ulcer all things considered. She has been tolerating the dressing changes without  complication. I do have her x-ray as well as her culture for review today the culture did so Staphylococcus aureus no MRSA noted which was great news susceptible to doxycycline which is what I did place her on last week. Overall the odor that she was noticing has also cleared which is great news. With regard to the x-ray there does not appear to be any signs of osteomyelitis or bone erosion at this point which is also great news in general and very pleased with where things stand. 12-27-2021 upon evaluation today patient appears to be doing well with regard to her wound this is actually measuring significantly better which is great news. Overall I am extremely pleased with where we stand today. No fevers, chills, nausea, vomiting, or diarrhea. 01-03-2022 upon evaluation today patient's wound is actually showing signs of significant improvement which is also news. I am very pleased with what we are seeing there is a little bit of callus buildup still but we are going to work on that today otherwise she seems to be making excellent progress which is great news. 01-10-2022 upon evaluation today patient appears to be doing well currently in regard to her toe ulcer. She has been tolerating the dressing changes without complication. Fortunately there does not appear to be any evidence of active infection locally or systemically which is great news. No fevers, chills, nausea, vomiting, or diarrhea. Electronic Signature(s) Signed: 01/10/2022 4:18:53 PM By: Worthy Keeler PA-C Entered By: Worthy Keeler on 01/10/2022 16:18:53 -------------------------------------------------------------------------------- Physical Exam Details Patient Name: Date of Service: Donna [name], Donna [name] 01/10/2022 8:15 A M Medical Record Number: [id number] Patient Account Number: [id number] Date of Birth/Sex: Treating RN: 08-02-1966 (56 y.o. Donna [name] Primary Care Provider: Pricilla Holm Other Clinician: Referring  Provider: Treating Provider/Extender: Dreama Saa in Treatment: 4 Constitutional Well-nourished and well-hydrated in no acute distress. Respiratory normal breathing without difficulty. Psychiatric this patient is able to make decisions and demonstrates good insight into disease process. Alert and Oriented x 3. pleasant and cooperative. Notes Patient's wound did require sharp debridement to clear away a lot of callus around the edges of the wound. Also did have to clear away some of the necrotic debris on the surface of the wound down to good subcutaneous tissue there was a bit of hypergranulation I think switching to [facility] may benefit her. Electronic Signature(s) Signed: 01/10/2022 4:19:12 PM By: Worthy Keeler PA-C Entered By: Worthy Keeler on 01/10/2022 16:19:11 -------------------------------------------------------------------------------- Physician Orders Details Patient Name: Date of Service: Donna [name], Donna [name] 01/10/2022 8:15 A M Medical Record Number: [id number] Patient Account Number: [id number] Date of Birth/Sex: Treating RN: 07-Jan-1966 (56 y.o. Donna [name], Donna [name] Primary Care Provider: Pricilla Holm Other Clinician: Referring Provider: Treating Provider/Extender: Dreama Saa in Treatment: 4 Verbal / Phone Orders: No Diagnosis Coding ICD-10 Coding Code Description E11.621 Type 2 diabetes mellitus with foot ulcer L97.812 Non-pressure chronic ulcer of other part of right lower leg with fat layer exposed E11.42 Type 2 diabetes mellitus with diabetic polyneuropathy I10 Essential (primary) hypertension Follow-up Appointments ppointment in 1 week. Jeri Cos, [place] and [place], Room 8  Wednesday 01/17/2022 215pm Return A (Dr. Dellia Nims covering) Jeri Cos, [place] and [place], Room 8 Wednesday 01/24/2022 815am Cellular or Tissue Based Products Wound #2 Right T Great oe Cellular or Tissue Based Product Type: -  01/10/2022 [employer] insurance authorization Bathing/ Shower/ Hygiene May shower with protection but do not get wound dressing(s) wet. - May purchase cast protector from [facility], [facility], or [employer] Edema Control - Lymphedema / SCD / Other Elevate legs to the level of the heart or above for 30 minutes daily and/or when sitting, a frequency of: - 3-4 times a day throughout the day. Avoid standing for long periods of time. Off-Loading Wedge [name] to: - wear when walking and standing. No bare feet. Wound Treatment Wound #2 - T Great oe Wound Laterality: Right Cleanser: Normal Saline 3 x Per Week/30 Days Discharge Instructions: Cleanse the wound with Normal Saline prior to applying a clean dressing using gauze sponges, not tissue or cotton balls. Cleanser: Vashe, 8.5 (oz) (Generic) 3 x Per Week/30 Days Discharge Instructions: use to wash the toe with vashe and gauze. Prim Dressing: Hydrofera Blue Ready Foam, 2.5 x2.5 in 3 x Per Week/30 Days ary Discharge Instructions: Apply to wound bed as instructed Secondary Dressing: Woven Gauze Sponges 2x2 in (Generic) 3 x Per Week/30 Days Discharge Instructions: Apply over primary dressing as directed. Secured With: Child psychotherapist, Sterile 2x75 (in/in) (Generic) 3 x Per Week/30 Days Discharge Instructions: Secure with stretch gauze as directed. Secured With: 26M Medipore H Soft Cloth Surgical T ape, 4 x 10 (in/yd) (Generic) 3 x Per Week/30 Days Discharge Instructions: Secure with tape as directed. Electronic Signature(s) Signed: 01/10/2022 2:56:41 PM By: Deon Pilling RN, BSN Signed: 01/10/2022 5:33:59 PM By: Worthy Keeler PA-C Entered By: Deon Pilling on 01/10/2022 08:47:14 -------------------------------------------------------------------------------- Problem List Details Patient Name: Date of Service: Donna [name] 01/10/2022 8:15 A M Medical Record Number: [id number] Patient Account Number: [id number] Date of  Birth/Sex: Treating RN: 10/29/1965 (56 y.o. Donna [name], Donna [name] Primary Care Provider: Pricilla Holm Other Clinician: Referring Provider: Treating Provider/Extender: Dreama Saa in Treatment: 4 Active Problems ICD-10 Encounter Code Description Active Date MDM Diagnosis E11.621 Type 2 diabetes mellitus with foot ulcer 12/13/2021 No Yes L97.812 Non-pressure chronic ulcer of other part of right lower leg with fat layer 12/13/2021 No Yes exposed E11.42 Type 2 diabetes mellitus with diabetic polyneuropathy 12/13/2021 No Yes I10 Essential (primary) hypertension 12/13/2021 No Yes Inactive Problems Resolved Problems Electronic Signature(s) Signed: 01/10/2022 2:56:41 PM By: Deon Pilling RN, BSN Signed: 01/10/2022 5:33:59 PM By: Worthy Keeler PA-C Entered By: Deon Pilling on 01/10/2022 08:33:38 -------------------------------------------------------------------------------- Progress Note Details Patient Name: Date of Service: Donna [name] 01/10/2022 8:15 A M Medical Record Number: [id number] Patient Account Number: [id number] Date of Birth/Sex: Treating RN: 1966/07/18 (56 y.o. 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 She tells me that since at least last summer she has had a progressive callused area that has opened closed but is never really healed. She has a history of calluses on her feet. She is a diabetic with neuropathy. She was referred to Dr. Posey Pronto at [facility] who felt that this might be a large plantar wart she was  treated with laser for 2 treatments and then  subsequently with ammonium lactate none of this is apparently made any difference according to the patient. She has a very odd looking area with thick skin and callus and an almost crossed like area of open tissue. She also has erythema above this which she says is chronic. The other interesting part of her recent history is recurrent cellulitis on the right lower leg although this is separated from the area we are looking at. She has had trips to see infectious disease has been on Keflex for as long as 20 days. This is presumed to be strep. She saw Dr. Gale Journey Past medical history includes type 2 diabetes, recurrent cellulitis of the right leg, right Achilles tendon repair a year ago for spontaneous rupture ABI in our clinic was 1.09 on the right 2/17; patient admitted the clinic last week had a thick callused area on the right lateral heel she is a diabetic with neuropathy. She had thick callus and across like area of open wound. I aggressively debrided this area to remove most of the callus and gave her Hydrofera Blue and heel off loader, heel cup and she has been wearing this religiously trying to keep the pressure off the area. She comes in today with a cross-like area of injury has healed she has a new open area that I do not remember from last time. Still some dry flaking skin and callus but not nearly as much as last week at which time I had to use a scalpel to remove this. 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 She came in 2 weeks ago with a very thick hyperkeratotic callused area on her right lateral heel. There was a "crossed" shaped area of split tissue with an open wound at the bottom. This had previously been treated as a possible wart without improvement. I removed all of the callus when she first came in here. This is mostly epithelialized over still thick skin but nothing like it was. She has 1 small open area that has not healed she has been more religious  about offloading the area 10/27/2020 upon evaluation today patient appears to be doing well with regard to her wound although she does still apparently appear to have something still open in the base of the wound. Fortunately there is no evidence of infection at this time which is great news and in general I feel like she is doing decently well though she has a lot of callus buildup she is using the offloading [name] for her heel. At home she does not always wear this she tells me. Generally she is using flip-flops there. Of note her primary care provider did give her Keflex yesterday for cellulitis of this leg but has nothing to do with the heel location. 3/17; the patient's area actually looks a lot better. There is no open wound here. Much less callus. She was given a prescription for 40% urea cream I am not sure that really is required this week. She is going to require less friction on this area. I think she is going to need a different type of [name] going forward Readmission: 12-13-2028 upon evaluation today patient presents for reevaluation in the clinic she was last seen by Dr. Dellia Nims on 11-03-2020 when she was discharged this was due to a crack on her heel. Currently she is actually having an issue with her great toe on the right foot. She tells me that this  has been present currently for a couple of weeks since around November 22, 2021. She has had a lot of callus over this area in general but not an open wound until that time. Subsequently this has more recently opened and has Become a greater concern she is also noted some odor which is disconcerting to her. She tells me that her greatest concern is that "she does not lose this toe". Again I completely understand her concern in this regard I definitely think we need to be aggressive in treating this and try to get things better. She is very Patent attorney. Patient does have a history of diabetes mellitus type 2 with diabetic neuropathy. She also tells  me that she does have a history as well of hypertension which is pretty well controlled. She has not yet had an x-ray of the area and has not been on any antibiotics. Both are things I think we will need to do today. 12-20-2021 upon evaluation today patient appears to be doing well with regard to her toe ulcer all things considered. She has been tolerating the dressing changes without complication. I do have her x-ray as well as her culture for review today the culture did so Staphylococcus aureus no MRSA noted which was great news susceptible to doxycycline which is what I did place her on last week. Overall the odor that she was noticing has also cleared which is great news. With regard to the x-ray there does not appear to be any signs of osteomyelitis or bone erosion at this point which is also great news in general and very pleased with where things stand. 12-27-2021 upon evaluation today patient appears to be doing well with regard to her wound this is actually measuring significantly better which is great news. Overall I am extremely pleased with where we stand today. No fevers, chills, nausea, vomiting, or diarrhea. 01-03-2022 upon evaluation today patient's wound is actually showing signs of significant improvement which is also news. I am very pleased with what we are seeing there is a little bit of callus buildup still but we are going to work on that today otherwise she seems to be making excellent progress which is great news. 01-10-2022 upon evaluation today patient appears to be doing well currently in regard to her toe ulcer. She has been tolerating the dressing changes without complication. Fortunately there does not appear to be any evidence of active infection locally or systemically which is great news. No fevers, chills, nausea, vomiting, or diarrhea. Objective Constitutional Well-nourished and well-hydrated in no acute distress. Vitals Time Taken: 8:25 AM, Height: 66 in, Weight: 220  lbs, BMI: 35.5, Temperature: 98 F, Pulse: 71 bpm, Respiratory Rate: 20 breaths/min, Blood Pressure: 124/74 mmHg. Respiratory normal breathing without difficulty. Psychiatric this patient is able to make decisions and demonstrates good insight into disease process. Alert and Oriented x 3. pleasant and cooperative. General Notes: Patient's wound did require sharp debridement to clear away a lot of callus around the edges of the wound. Also did have to clear away some of the necrotic debris on the surface of the wound down to good subcutaneous tissue there was a bit of hypergranulation I think switching to [facility] may benefit her. Integumentary (Hair, Skin) Wound #2 status is Open. Original cause of wound was Blister. The date acquired was: 11/22/2021. The wound has been in treatment 4 weeks. The wound is located on the Right T Great. The wound measures 1.1cm length x 1cm width x 0.1cm depth; 0.864cm^2  area and 0.086cm^3 volume. There is Fat Layer oe (Subcutaneous Tissue) exposed. There is no tunneling or undermining noted. There is a medium amount of serosanguineous drainage noted. The wound margin is distinct with the outline attached to the wound base. There is large (67-100%) red, pink granulation within the wound bed. There is no necrotic tissue within the wound bed. General Notes: callous periwound. 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 There was a Excisional Skin/Subcutaneous Tissue Debridement with a total area of 4 sq cm performed by Worthy Keeler, PA. With the following  instrument(s): Curette to remove Viable and Non-Viable tissue/material. Material removed includes Callus, Subcutaneous Tissue, Slough, Biofilm, and Fibrin/Exudate after achieving pain control using Lidocaine 5% topical ointment. A time out was conducted at 08:35, prior to the start of the procedure. A Minimum amount of bleeding was controlled with Pressure. The procedure was tolerated well with a pain level of 0 throughout and a pain level of 0 following the procedure. Post Debridement Measurements: 1.1cm length x 1cm width x 0.1cm depth; 0.086cm^3 volume. Character of Wound/Ulcer Post Debridement is improved. Severity of Tissue Post Debridement is: Fat layer exposed. Post procedure Diagnosis Wound #2: Same as Pre-Procedure Plan Follow-up Appointments: Return Appointment in 1 week. Jeri Cos, [place] and [place], Room 8 Wednesday 01/17/2022 215pm (Dr. Dellia Nims covering) Jeri Cos, [place] and [place], Room 8 Wednesday 01/24/2022 815am Cellular or Tissue Based Products: Wound #2 Right T Great: oe Cellular or Tissue Based Product Type: - 01/10/2022 [employer] insurance authorization Bathing/ Shower/ Hygiene: May shower with protection but do not get wound dressing(s) wet. - May purchase cast protector from [facility], [facility], or [employer] Edema Control - Lymphedema / SCD / Other: Elevate legs to the level of the heart or above for 30 minutes daily and/or when sitting, a frequency of: - 3-4 times a day throughout the day. Avoid standing for long periods of time. Off-Loading: Wedge [name] to: - wear when walking and standing. No bare feet. WOUND #2: - T Great Wound Laterality: Right oe Cleanser: Normal Saline 3 x Per Week/30 Days Discharge Instructions: Cleanse the wound with Normal Saline prior to applying a clean dressing using gauze sponges, not tissue or cotton balls. Cleanser: Vashe, 8.5 (oz) (Generic) 3 x Per Week/30 Days Discharge Instructions: use to wash the toe with vashe and gauze. Prim Dressing: Hydrofera  Blue Ready Foam, 2.5 x2.5 in 3 x Per Week/30 Days ary Discharge Instructions: Apply to wound bed as instructed Secondary Dressing: Woven Gauze Sponges 2x2 in (Generic) 3 x Per Week/30 Days Discharge Instructions: Apply over primary dressing as directed. Secured With: Child psychotherapist, Sterile 2x75 (in/in) (Generic) 3 x Per Week/30 Days Discharge Instructions: Secure with stretch gauze as directed. Secured With: 87M Medipore H Soft Cloth Surgical T ape, 4 x 10 (in/yd) (Generic) 3 x Per Week/30 Days Discharge Instructions: Secure with tape as directed. 1. I would recommend currently that we going to switch over to [facility] which I do believe is good to be a good option here. 2. I am also can recommend that we have the patient continue to monitor for any signs of worsening  or infection. Obviously if anything changes she should let me know as soon as possible. We will see patient back for reevaluation in 1 week here in the clinic. If anything worsens or changes patient will contact our office for additional recommendations. Electronic Signature(s) Signed: 01/10/2022 4:19:42 PM By: Worthy Keeler PA-C Entered By: Worthy Keeler on 01/10/2022 16:19:42 -------------------------------------------------------------------------------- SuperBill Details Patient Name: Date of Service: LILIJA, Donna [name] 01/10/2022 Medical Record Number: [id number] Patient Account Number: [id number] Date of Birth/Sex: Treating RN: December 09, 1965 (57 y.o. Donna [name], Donna [name] Primary Care Provider: Pricilla Holm Other Clinician: Referring Provider: Treating Provider/Extender: Dreama Saa in Treatment: 4 Diagnosis Coding ICD-10 Codes Code Description [id number] Type 2 diabetes mellitus with foot ulcer L97.812 Non-pressure chronic ulcer of other part of right lower leg with fat layer exposed E11.42 Type 2 diabetes mellitus with diabetic polyneuropathy I10 Essential  (primary) hypertension Facility Procedures CPT4 Code: [id number] Description: [id number] - DEB SUBQ TISSUE 20 SQ CM/< ICD-10 Diagnosis Description L97.812 Non-pressure chronic ulcer of other part of right lower leg with fat layer expo Modifier: sed Quantity: 1 Physician Procedures : CPT4 Code Description Modifier [id number] - WC PHYS SUBQ TISS 20 SQ CM 1 ICD-10 Diagnosis Description [id number] Non-pressure chronic ulcer of other part of right lower leg with fat layer exposed Quantity: Electronic Signature(s) Signed: 01/10/2022 2:56:41 PM By: Deon Pilling RN, BSN Signed: 01/10/2022 5:33:59 PM By: Worthy Keeler PA-C Entered By: Deon Pilling on 01/10/2022 08:42:56

## 2022-01-17 ENCOUNTER — Encounter (HOSPITAL_BASED_OUTPATIENT_CLINIC_OR_DEPARTMENT_OTHER): Payer: 59 | Admitting: Physician Assistant

## 2022-01-17 DIAGNOSIS — E1140 Type 2 diabetes mellitus with diabetic neuropathy, unspecified: Secondary | ICD-10-CM | POA: Diagnosis not present

## 2022-01-17 DIAGNOSIS — I10 Essential (primary) hypertension: Secondary | ICD-10-CM | POA: Diagnosis not present

## 2022-01-17 DIAGNOSIS — E11621 Type 2 diabetes mellitus with foot ulcer: Secondary | ICD-10-CM | POA: Diagnosis not present

## 2022-01-17 DIAGNOSIS — L97512 Non-pressure chronic ulcer of other part of right foot with fat layer exposed: Secondary | ICD-10-CM | POA: Diagnosis not present

## 2022-01-17 DIAGNOSIS — L97812 Non-pressure chronic ulcer of other part of right lower leg with fat layer exposed: Secondary | ICD-10-CM | POA: Diagnosis not present

## 2022-01-17 NOTE — Progress Notes (Addendum)
LAQUESHA, HOLCOMB ([id number]) Visit Report for 01/17/2022 Chief Complaint Document Details Patient Name: Date of Service: Donna [name], Donna [name] 01/17/2022 2:15 PM Medical Record Number: [id number] Patient Account Number: [id number] Date of Birth/Sex: Treating RN: 1966-01-14 (56 y.o. Donna [name] Primary Care Provider: Hillard Danker Other Clinician: Referring Provider: Treating Provider/Extender: [name] in Treatment: 5 Information Obtained from: Patient Chief Complaint Right great toe ulcer Electronic Signature(s) Signed: 01/17/2022 2:06:45 PM By: Lenda Kelp PA-C Entered By: Lenda Kelp on 01/17/2022 14:06:45 -------------------------------------------------------------------------------- Debridement Details Patient Name: Date of Service: Donna [name], Donna [name] 01/17/2022 2:15 PM Medical Record Number: [id number] Patient Account Number: [id number] Date of Birth/Sex: Treating RN: Dec 21, 1965 (56 y.o. Donna [name], Donna [name] Primary Care Provider: Hillard Danker Other Clinician: Referring Provider: Treating Provider/Extender: [name] in Treatment: 5 Debridement Performed for Assessment: Wound #2 Right T Great oe Performed By: Physician Lenda Kelp, PA Debridement Type: Debridement Severity of Tissue Pre Debridement: Fat layer exposed Level of Consciousness (Pre-procedure): Awake and Alert Pre-procedure Verification/Time Out Yes - 14:30 Taken: Start Time: 14:31 Pain Control: Other : Benzocaine 20% spray T Area Debrided (L x W): otal 1.1 (cm) x 1 (cm) = 1.1 (cm) Tissue and other material debrided: Viable, Non-Viable, Callus, Subcutaneous, Fibrin/Exudate, Hyper-granulation Level: Skin/Subcutaneous Tissue Debridement Description: Excisional Instrument: Curette Bleeding: Moderate Hemostasis Achieved: Silver Nitrate End Time: 14:39 Procedural Pain: 0 Post Procedural Pain: 0 Response to Treatment:  Procedure was tolerated well Level of Consciousness (Post- Awake and Alert procedure): Post Debridement Measurements of Total Wound Length: (cm) 1.1 Width: (cm) 1 Depth: (cm) 0.1 Volume: (cm) 0.086 Character of Wound/Ulcer Post Debridement: Improved Severity of Tissue Post Debridement: Fat layer exposed Post Procedure Diagnosis Same as Pre-procedure Electronic Signature(s) Signed: 01/17/2022 4:11:22 PM By: Lenda Kelp PA-C Signed: 01/17/2022 5:29:55 PM By: Shawn Stall RN, BSN Entered By: Shawn Stall on 01/17/2022 14:39:17 -------------------------------------------------------------------------------- HPI Details Patient Name: Date of Service: Donna [name] 01/17/2022 2:15 PM Medical Record Number: [id number] Patient Account Number: [id number] Date of Birth/Sex: Treating RN: 1966-05-27 (56 y.o. 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 She tells me that since at least last summer she has had a progressive callused area that has opened closed but is never really healed. She has a history of calluses on her feet. She is a diabetic with neuropathy. She was referred to Dr. All[facility] Katz at [facility] who felt that this might be a large plantar wart she was treated with laser for 2 treatments and then subsequently with ammonium lactate none of this is apparently made any difference according to the patient. She has a very odd looking area with thick skin and callus and an almost crossed like area of open tissue. She also has erythema above this which she says is chronic. The other interesting part of her recent history is recurrent cellulitis on the right lower leg although this is  separated from the area we are looking at. She has had trips to see infectious disease has been on Keflex for as long as 20 days. This is presumed to be strep. She saw Dr. Renold Don Past medical history includes type 2 diabetes, recurrent cellulitis of the right leg, right Achilles tendon repair a year ago for spontaneous  rupture ABI in our clinic was 1.09 on the right 2/17; patient admitted the clinic last week had a thick callused area on the right lateral heel she is a diabetic with neuropathy. She had thick callus and across like area of open wound. I aggressively debrided this area to remove most of the callus and gave her Hydrofera Blue and heel off loader, heel cup and she has been wearing this religiously trying to keep the pressure off the area. She comes in today with a cross-like area of injury has healed she has a new open area that I do not remember from last time. Still some dry flaking skin and callus but not nearly as much as last week at which time I had to use a scalpel to remove this. 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 She came in 2 weeks ago with a very thick hyperkeratotic callused area on her right lateral heel. There was a "crossed" shaped area of split tissue with an open wound at the bottom. This had previously been treated as a possible wart without improvement. I removed all of the callus when she first came in here. This is mostly epithelialized over still thick skin but nothing like it was. She has 1 small open area that has not healed she has been more religious about offloading the area 10/27/2020 upon evaluation today patient appears to be doing well with regard to her wound although she does still apparently appear to have something still open in the base of the wound. Fortunately there is no evidence of infection at this time which is great news and in general I feel like she is doing decently well though she has a lot of callus buildup she is  using the offloading shoe for her heel. At home she does not always wear this she tells me. Generally she is using flip-flops there. Of note her primary care provider did give her Keflex yesterday for cellulitis of this leg but has nothing to do with the heel location. 3/17; the patient's area actually looks a lot better. There is no open wound here. Much less callus. She was given a prescription for 40% urea cream I am not sure that really is required this week. She is going to require less friction on this area. I think she is going to need a different type of shoe going forward Readmission: 12-13-2028 upon evaluation today patient presents for reevaluation in the clinic she was last seen by Dr. Leanord Hawkingobson on 11-03-2020 when she was discharged this was due to a crack on her heel. Currently she is actually having an issue with her great toe on the right foot. She tells me that this has been present currently for a couple of weeks since around November 22, 2021. She has had a lot of callus over this area in general but not an open wound until that time. Subsequently this has more recently opened and has Become a greater concern she is also noted some odor which is disconcerting to her. She tells me that her greatest concern is that "she does not lose this toe". Again I completely understand her concern in this regard I definitely think we need to be aggressive in treating this and try to get things better. She is very Adult nurseappreciative. Patient does have a history of diabetes mellitus type 2 with diabetic neuropathy. She also tells me that she does have a history as well of hypertension which is pretty well controlled.  She has not yet had an x-ray of the area and has not been on any antibiotics. Both are things I think we will need to do today. 12-20-2021 upon evaluation today patient appears to be doing well with regard to her toe ulcer all things considered. She has been tolerating the dressing changes without  complication. I do have her x-ray as well as her culture for review today the culture did so Staphylococcus aureus no MRSA noted which was great news susceptible to doxycycline which is what I did place her on last week. Overall the odor that she was noticing has also cleared which is great news. With regard to the x-ray there does not appear to be any signs of osteomyelitis or bone erosion at this point which is also great news in general and very pleased with where things stand. 12-27-2021 upon evaluation today patient appears to be doing well with regard to her wound this is actually measuring significantly better which is great news. Overall I am extremely pleased with where we stand today. No fevers, chills, nausea, vomiting, or diarrhea. 01-03-2022 upon evaluation today patient's wound is actually showing signs of significant improvement which is also news. I am very pleased with what we are seeing there is a little bit of callus buildup still but we are going to work on that today otherwise she seems to be making excellent progress which is great news. 01-10-2022 upon evaluation today patient appears to be doing well currently in regard to her toe ulcer. She has been tolerating the dressing changes without complication. Fortunately there does not appear to be any evidence of active infection locally or systemically which is great news. No fevers, chills, nausea, vomiting, or diarrhea. 01-17-2022 upon evaluation today patient's toe was actually doing about the same this week there is not a tremendous improvement in size but overall the appearance of the wound is better it is much flatter I do think the [facility] has done well in that regard. We will still wait to hear back on the skin substitute. With that being said I do believe that the patient could benefit from again the skin substitute to try to help get this to heal much more effectively and quickly in the meantime I am trying to help  flatten this out which I think is doing a pretty good job.. Electronic Signature(s) Signed: 01/17/2022 3:32:04 PM By: Lenda Kelp PA-C Entered By: Lenda Kelp on 01/17/2022 15:32:04 -------------------------------------------------------------------------------- Physical Exam Details Patient Name: Date of Service: Donna [name], Donna [name] 01/17/2022 2:15 PM Medical Record Number: [id number] Patient Account Number: [id number] Date of Birth/Sex: Treating RN: [birth year] (56 y.o. Donna [name] Primary Care Provider: Hillard Danker Other Clinician: Referring Provider: Treating Provider/Extender: [name] in Treatment: 5 Psychiatric this patient is able to make decisions and demonstrates good insight into disease process. Alert and Oriented x 3. pleasant and cooperative. Notes Upon inspection patient's wound bed actually showed signs of good granulation epithelization at this point. Fortunately there does not appear to be any evidence of infection locally or systemically which is great news. I did perform some sharp debridement which patient tolerated today without complication postdebridement wound bed appears to be doing better there was a little bit of bleeding I did do silver nitrate to completely cauterize that should also help to flatten out the wound bed hopefully it will be doing much better already from last week to this weeks it significantly improved hopefully by next week  will be even better than this week. Electronic Signature(s) Signed: 01/17/2022 3:33:11 PM By: Lenda Kelp PA-C Entered By: Lenda Kelp on 01/17/2022 15:33:11 -------------------------------------------------------------------------------- Physician Orders Details Patient Name: Date of Service: Donna [name], Donna [name] 01/17/2022 2:15 PM Medical Record Number: [id number] Patient Account Number: [id number] Date of Birth/Sex: Treating RN: 12/17/1965 (56 y.o. Donna [name],  Millard.[place]oa Primary Care Provider: Hillard Danker Other Clinician: Referring Provider: Treating Provider/Extender: [name] in Treatment: 5 Verbal / Phone Orders: No Diagnosis Coding ICD-10 Coding Code Description E11.621 Type 2 diabetes mellitus with foot ulcer L97.812 Non-pressure chronic ulcer of other part of right lower leg with fat layer exposed E11.42 Type 2 diabetes mellitus with diabetic polyneuropathy I10 Essential (primary) hypertension Follow-up Appointments ppointment in 1 week. - (Dr. Leanord Hawking covering) [place], [place], Room 8 Wednesday 01/24/2022 815am Return A [place], [place], Room 8 Wednesday 01/31/2022 0815am Cellular or Tissue Based Products Wound #2 Right T Great oe Cellular or Tissue Based Product Type: - 01/10/2022 [employer] run insurance authorization 01/17/2022-pending Bathing/ Shower/ Hygiene May shower with protection but do not get wound dressing(s) wet. - May purchase cast protector from [facility], [facility], or [employer] Edema Control - Lymphedema / SCD / Other Elevate legs to the level of the heart or above for 30 minutes daily and/or when sitting, a frequency of: - 3-4 times a day throughout the day. Avoid standing for long periods of time. Off-Loading Wedge shoe to: - wear when walking and standing. No bare feet. Wound Treatment Wound #2 - T Great oe Wound Laterality: Right Cleanser: Normal Saline 3 x Per Week/30 Days Discharge Instructions: Cleanse the wound with Normal Saline prior to applying a clean dressing using gauze sponges, not tissue or cotton balls. Cleanser: Vashe, 8.5 (oz) (Generic) 3 x Per Week/30 Days Discharge Instructions: use to wash the toe with vashe and gauze. Prim Dressing: Hydrofera Blue Ready Foam, 2.5 x2.5 in 3 x Per Week/30 Days ary Discharge Instructions: Apply to wound bed as instructed Secondary Dressing: Woven Gauze Sponges 2x2 in (Generic) 3 x Per Week/30 Days Discharge Instructions:  Apply over primary dressing as directed. Secured With: Insurance underwriter, Sterile 2x75 (in/in) (Generic) 3 x Per Week/30 Days Discharge Instructions: Secure with stretch gauze as directed. Secured With: 29M Medipore H Soft Cloth Surgical T ape, 4 x 10 (in/yd) (Generic) 3 x Per Week/30 Days Discharge Instructions: Secure with tape as directed. Electronic Signature(s) Signed: 01/17/2022 4:11:22 PM By: Lenda Kelp PA-C Signed: 01/17/2022 5:29:55 PM By: Shawn Stall RN, BSN Entered By: Shawn Stall on 01/17/2022 14:39:28 -------------------------------------------------------------------------------- Problem List Details Patient Name: Date of Service: Donna [name] 01/17/2022 2:15 PM Medical Record Number: [id number] Patient Account Number: [id number] Date of Birth/Sex: Treating RN: 1965-10-09 (56 y.o. Donna [name], Donna [name] Primary Care Provider: Hillard Danker Other Clinician: Referring Provider: Treating Provider/Extender: [name] in Treatment: 5 Active Problems ICD-10 Encounter Code Description Active Date MDM Diagnosis E11.621 Type 2 diabetes mellitus with foot ulcer 12/13/2021 No Yes L97.812 Non-pressure chronic ulcer of other part of right lower leg with fat layer 12/13/2021 No Yes exposed E11.42 Type 2 diabetes mellitus with diabetic polyneuropathy 12/13/2021 No Yes I10 Essential (primary) hypertension 12/13/2021 No Yes Inactive Problems Resolved Problems Electronic Signature(s) Signed: 01/17/2022 2:06:39 PM By: Lenda Kelp PA-C Entered By: Lenda Kelp on 01/17/2022 14:06:39 -------------------------------------------------------------------------------- Progress Note Details Patient Name: Date of Service: Donna [name] 01/17/2022 2:15 PM Medical Record Number: [id number]  Patient Account Number: [id number] Date of Birth/Sex: Treating RN: 11-21-1965 (56 y.o. 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 She tells me that since at least last summer she has had a progressive callused area that has opened closed but is never really healed. She has a history of calluses on her feet. She is a diabetic with neuropathy. She was referred to Dr. Allena Katz at [facility] who felt that this might be a large plantar wart she was treated with laser for 2 treatments and then subsequently with ammonium lactate none of this is apparently made any difference according to the patient. She has a very odd looking area with thick skin and callus and an almost crossed like area of open tissue. She also has erythema above this which she says is chronic. The other interesting part of her recent history is recurrent cellulitis on the right lower leg although this is separated from the area we are looking at. She has had trips to see infectious disease has been on Keflex for as long as 20 days. This is presumed to be strep. She saw Dr. Renold Don Past medical history includes type 2 diabetes, recurrent cellulitis of the right leg, right Achilles tendon repair a year ago for spontaneous rupture ABI in our clinic was 1.09 on the right 2/17; patient admitted the clinic last week had a thick callused area on the right lateral heel she is a diabetic with neuropathy. She had thick callus and across like area of open wound. I aggressively debrided this area to remove most of the callus and gave her Hydrofera Blue and heel off loader, heel cup and she has been wearing this religiously trying to keep the pressure off the area. She comes in today with a cross-like area of injury has  healed she has a new open area that I do not remember from last time. Still some dry flaking skin and callus but not nearly as much as last week at which time I had to use a scalpel to remove this. 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 She came in 2 weeks ago with a very thick hyperkeratotic callused area on her right lateral heel. There was a "crossed" shaped area of split tissue with an open wound at the bottom. This had previously been treated as a possible wart without improvement. I removed all of the callus when she first came in here. This is mostly epithelialized over still thick skin but nothing like it was. She has 1 small open area that has not healed she has been more religious about offloading the area 10/27/2020 upon evaluation today patient appears to be doing well with regard to her wound although she does still apparently appear to have something still open in the base of the wound. Fortunately there is no evidence of infection at this time which is great news and in general I feel like she is doing decently well though she has a lot of callus buildup she is using the offloading shoe for her heel. At home she does not always wear this she tells me. Generally she is using flip-flops there. Of note her primary care provider did give her  Keflex yesterday for cellulitis of this leg but has nothing to do with the heel location. 3/17; the patient's area actually looks a lot better. There is no open wound here. Much less callus. She was given a prescription for 40% urea cream I am not sure that really is required this week. She is going to require less friction on this area. I think she is going to need a different type of shoe going forward Readmission: 12-13-2028 upon evaluation today patient presents for reevaluation in the clinic she was last seen by Dr. Leanord Hawking on 11-03-2020 when she was discharged this was due to a crack on her heel. Currently she is actually  having an issue with her great toe on the right foot. She tells me that this has been present currently for a couple of weeks since around November 22, 2021. She has had a lot of callus over this area in general but not an open wound until that time. Subsequently this has more recently opened and has Become a greater concern she is also noted some odor which is disconcerting to her. She tells me that her greatest concern is that "she does not lose this toe". Again I completely understand her concern in this regard I definitely think we need to be aggressive in treating this and try to get things better. She is very Adult nurse. Patient does have a history of diabetes mellitus type 2 with diabetic neuropathy. She also tells me that she does have a history as well of hypertension which is pretty well controlled. She has not yet had an x-ray of the area and has not been on any antibiotics. Both are things I think we will need to do today. 12-20-2021 upon evaluation today patient appears to be doing well with regard to her toe ulcer all things considered. She has been tolerating the dressing changes without complication. I do have her x-ray as well as her culture for review today the culture did so Staphylococcus aureus no MRSA noted which was great news susceptible to doxycycline which is what I did place her on last week. Overall the odor that she was noticing has also cleared which is great news. With regard to the x-ray there does not appear to be any signs of osteomyelitis or bone erosion at this point which is also great news in general and very pleased with where things stand. 12-27-2021 upon evaluation today patient appears to be doing well with regard to her wound this is actually measuring significantly better which is great news. Overall I am extremely pleased with where we stand today. No fevers, chills, nausea, vomiting, or diarrhea. 01-03-2022 upon evaluation today patient's wound is actually  showing signs of significant improvement which is also news. I am very pleased with what we are seeing there is a little bit of callus buildup still but we are going to work on that today otherwise she seems to be making excellent progress which is great news. 01-10-2022 upon evaluation today patient appears to be doing well currently in regard to her toe ulcer. She has been tolerating the dressing changes without complication. Fortunately there does not appear to be any evidence of active infection locally or systemically which is great news. No fevers, chills, nausea, vomiting, or diarrhea. 01-17-2022 upon evaluation today patient's toe was actually doing about the same this week there is not a tremendous improvement in size but overall the appearance of the wound is better it is much flatter I do think the  Hydrofera Blue has done well in that regard. We will still wait to hear back on the skin substitute. With that being said I do believe that the patient could benefit from again the skin substitute to try to help get this to heal much more effectively and quickly in the meantime I am trying to help flatten this out which I think is doing a pretty good job.. Objective Constitutional Vitals Time Taken: 2:07 PM, Height: 66 in, Weight: 220 lbs, BMI: 35.5, Temperature: 98.5 F, Pulse: 87 bpm, Respiratory Rate: 16 breaths/min, Blood Pressure: 138/75 mmHg. Psychiatric this patient is able to make decisions and demonstrates good insight into disease process. Alert and Oriented x 3. pleasant and cooperative. General Notes: Upon inspection patient's wound bed actually showed signs of good granulation epithelization at this point. Fortunately there does not appear to be any evidence of infection locally or systemically which is great news. I did perform some sharp debridement which patient tolerated today without complication postdebridement wound bed appears to be doing better there was a little bit of  bleeding I did do silver nitrate to completely cauterize that should also help to flatten out the wound bed hopefully it will be doing much better already from last week to this weeks it significantly improved hopefully by next week will be even better than this week. Integumentary (Hair, Skin) Wound #2 status is Open. Original cause of wound was Blister. The date acquired was: 11/22/2021. The wound has been in treatment 5 weeks. The wound is located on the Right T Great. The wound measures 1.1cm length x 1cm width x 0.1cm depth; 0.864cm^2 area and 0.086cm^3 volume. There is Fat Layer oe (Subcutaneous Tissue) exposed. There is no tunneling or undermining noted. There is a medium amount of serosanguineous drainage noted. The wound margin is distinct with the outline attached to the wound base. There is large (67-100%) red, pink, hyper - granulation within the wound bed. There is no necrotic tissue within the wound bed. 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 There was a Excisional Skin/Subcutaneous Tissue Debridement with a total area of 1.1 sq cm performed by Lenda Kelp, PA. With the following instrument(s): Curette to remove Viable and Non-Viable tissue/material. Material removed includes Callus, Subcutaneous Tissue, Fibrin/Exudate, and Hyper-granulation after achieving pain control using Other (Benzocaine 20% spray). A time out was conducted at 14:30, prior to the start of the procedure. A Moderate amount of bleeding was controlled with Silver Nitrate. The procedure was tolerated well with a  pain level of 0 throughout and a pain level of 0 following the procedure. Post Debridement Measurements: 1.1cm length x 1cm width x 0.1cm depth; 0.086cm^3 volume. Character of Wound/Ulcer Post Debridement is improved. Severity of Tissue Post Debridement is: Fat layer exposed. Post procedure Diagnosis Wound #2: Same as Pre-Procedure Plan Follow-up Appointments: Return Appointment in 1 week. - (Dr. Leanord Hawking covering) [place], [place] and [place], Room 8 Wednesday 01/24/2022 815am [place], [place] and [place], Room 8 Wednesday 01/31/2022 0815am Cellular or Tissue Based Products: Wound #2 Right T Great: oe Cellular or Tissue Based Product Type: - 01/10/2022 [employer] insurance authorization 01/17/2022-pending Bathing/ Shower/ Hygiene:  May shower with protection but do not get wound dressing(s) wet. - May purchase cast protector from [facility], [facility], or [employer] Edema Control - Lymphedema / SCD / Other: Elevate legs to the level of the heart or above for 30 minutes daily and/or when sitting, a frequency of: - 3-4 times a day throughout the day. Avoid standing for long periods of time. Off-Loading: Wedge shoe to: - wear when walking and standing. No bare feet. WOUND #2: - T Great Wound Laterality: Right oe Cleanser: Normal Saline 3 x Per Week/30 Days Discharge Instructions: Cleanse the wound with Normal Saline prior to applying a clean dressing using gauze sponges, not tissue or cotton balls. Cleanser: Vashe, 8.5 (oz) (Generic) 3 x Per Week/30 Days Discharge Instructions: use to wash the toe with vashe and gauze. Prim Dressing: Hydrofera Blue Ready Foam, 2.5 x2.5 in 3 x Per Week/30 Days ary Discharge Instructions: Apply to wound bed as instructed Secondary Dressing: Woven Gauze Sponges 2x2 in (Generic) 3 x Per Week/30 Days Discharge Instructions: Apply over primary dressing as directed. Secured With: Insurance underwriter, Sterile 2x75 (in/in) (Generic) 3 x Per Week/30 Days Discharge Instructions:  Secure with stretch gauze as directed. Secured With: 5M Medipore H Soft Cloth Surgical T ape, 4 x 10 (in/yd) (Generic) 3 x Per Week/30 Days Discharge Instructions: Secure with tape as directed. 1. I am going to suggest that we go ahead and continue with the [facility] which I do believe is still doing a good job here. 2. I am also can recommend that the patient continue with the front offloading shoe which I do believe is helping to keep pressure off. I would say were trying to as much as possible to prevent any pressure to the tip of this toe allowing it to heal much more effectively there was much less callus buildup that is previously been noted this was great news. 3. 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 That is unfortunate but nonetheless she is trying to do as well that she can as far as keeping pressure off of this and I think she is doing a great job. As soon as we get any information back on the skin so we will see about initiating that as well. We will see patient back for reevaluation in 1 week here in the clinic. If anything worsens or changes patient will contact our office for additional recommendations. Electronic Signature(s) Signed: 01/17/2022 3:34:35 PM By: Lenda Kelp PA-C Entered By: Lenda Kelp on 01/17/2022 15:34:35 -------------------------------------------------------------------------------- SuperBill Details Patient Name: Date of Service: Donna [name], Donna [name] 01/17/2022 Medical Record Number: [id number] Patient Account Number: [id number] Date of Birth/Sex: Treating RN: 04/14/1966 (56 y.o. Donna [name], Millard.Loa Primary Care Provider: Hillard Danker Other Clinician: Referring Provider: Treating Provider/Extender: [name] in Treatment: 5 Diagnosis Coding ICD-10 Codes Code Description [phone] Type 2 diabetes mellitus with foot ulcer L97.812 Non-pressure chronic ulcer of other part of right lower leg with fat layer exposed E11.42 Type 2 diabetes mellitus with diabetic polyneuropathy I10 Essential (primary) hypertension Facility Procedures CPT4 Code: [id number] I Description: 11042 - DEB SUBQ TISSUE 20 SQ CM/< CD-10 Diagnosis Description L97.812 Non-pressure chronic ulcer of other part of right lower leg with  fat layer exposed Modifier: Quantity: 1 Physician Procedures : CPT4 Code Description Modifier [id number] 11042 - WC PHYS SUBQ TISS 20 SQ CM ICD-10 Diagnosis Description L97.812 Non-pressure chronic ulcer of other part of right lower leg with fat layer exposed Quantity: 1 Electronic Signature(s) Signed: 01/17/2022 3:38:58 PM By: Lenda Kelp PA-C Entered By: Lenda Kelp on 01/17/2022 15:38:58

## 2022-01-17 NOTE — Progress Notes (Signed)
BREIA, OCAMPO ([id number]) Visit Report for 01/17/2022 Arrival Information Details Patient Name: Date of Service: ANNI, HOCEVAR 01/17/2022 2:15 PM Medical Record Number: [id number] Patient Account Number: [id number] Date of Birth/Sex: Treating RN: Jul 30, 1966 (56 y.o. [name], [name] Primary Care Mykal Batiz: Pricilla Holm Other Clinician: Referring Ladene Allocca: Treating Nehemiah Montee/Extender: Dreama Saa in Treatment: 5 Visit Information History Since Last Visit Added or deleted any medications: No Patient Arrived: Ambulatory Any new allergies or adverse reactions: No Arrival Time: 14:08 Had a fall or experienced change in No Accompanied By: self activities of daily living that may affect Transfer Assistance: None risk of falls: Patient Identification Verified: Yes Signs or symptoms of abuse/neglect since last visito No Secondary Verification Process Completed: Yes Hospitalized since last visit: No Patient Requires Transmission-Based Precautions: No Implantable device outside of the clinic excluding No Patient Has Alerts: No cellular tissue based products placed in the center since last visit: Has Dressing in Place as Prescribed: Yes Has Footwear/Offloading in Place as Prescribed: Yes Right: Wedge Shoe Pain Present Now: No Electronic Signature(s) Signed: 01/17/2022 5:29:55 PM By: Deon Pilling RN, BSN Entered By: Deon Pilling on 01/17/2022 14:08:39 -------------------------------------------------------------------------------- Encounter Discharge Information Details Patient Name: Date of Service: [name] 01/17/2022 2:15 PM Medical Record Number: [id number] Patient Account Number: [id number] Date of Birth/Sex: Treating RN: May 21, 1966 (55 y.o. [name], [name] Primary Care Dorse Locy: Pricilla Holm Other Clinician: Referring Clarie Camey: Treating Caylyn Tedeschi/Extender: Dreama Saa in Treatment:  5 Encounter Discharge Information Items Post Procedure Vitals Discharge Condition: Stable Temperature (F): 98.5 Ambulatory Status: Ambulatory Pulse (bpm): 67 Discharge Destination: Home Respiratory Rate (breaths/min): 16 Transportation: Private Auto Blood Pressure (mmHg): 138/75 Accompanied By: self Schedule Follow-up Appointment: Yes Clinical Summary of Care: Electronic Signature(s) Signed: 01/17/2022 5:29:55 PM By: Deon Pilling RN, BSN Entered By: Deon Pilling on 01/17/2022 14:40:25 -------------------------------------------------------------------------------- Lower Extremity Assessment Details Patient Name: Date of Service: ADREAM, PARZYCH 01/17/2022 2:15 PM Medical Record Number: [id number] Patient Account Number: [id number] Date of Birth/Sex: Treating RN: Jan 19, 1966 (56 y.o. [name] Primary Care Andriea Hasegawa: Pricilla Holm Other Clinician: Referring Delayna Sparlin: Treating Nalla Purdy/Extender: Dreama Saa in Treatment: 5 Edema Assessment Assessed: [Left: No] [Right: Yes] Edema: [Left: N] [Right: o] Calf Left: Right: Point of Measurement: 28 cm From Medial Instep 38 cm Ankle Left: Right: Point of Measurement: 8 cm From Medial Instep 22 cm Vascular Assessment Pulses: Dorsalis Pedis Palpable: [Right:Yes] Electronic Signature(s) Signed: 01/17/2022 5:29:55 PM By: Deon Pilling RN, BSN Entered By: Deon Pilling on 01/17/2022 14:12:48 -------------------------------------------------------------------------------- [place] Details Patient Name: Date of Service: [name] 01/17/2022 2:15 PM Medical Record Number: [id number] Patient Account Number: [id number] Date of Birth/Sex: Treating RN: [birth year] (56 y.o. [name], [name] Primary Care Lydia Toren: Pricilla Holm Other Clinician: Referring Alonte Wulff: Treating Aedon Deason/Extender: Dreama Saa in Treatment: 5 Active  Inactive Pain, Acute or Chronic Nursing Diagnoses: Pain, acute or chronic: actual or potential Potential alteration in comfort, pain Goals: Patient will verbalize adequate pain control and receive pain control interventions during procedures as needed Date Initiated: 12/13/2021 Target Resolution Date: 02/16/2022 Goal Status: Active Patient/caregiver will verbalize comfort level met Date Initiated: 12/13/2021 Target Resolution Date: 02/16/2022 Goal Status: Active Interventions: Encourage patient to take pain medications as prescribed Provide education on pain management Reposition patient for comfort Treatment Activities: Administer pain control measures as ordered : 12/13/2021 Notes: Wound/Skin Impairment Nursing Diagnoses: Knowledge deficit related to ulceration/compromised skin integrity Goals: Patient/caregiver will verbalize understanding of  skin care regimen Date Initiated: 12/13/2021 Target Resolution Date: 02/16/2022 Goal Status: Active Interventions: Assess patient/caregiver ability to obtain necessary supplies Assess patient/caregiver ability to perform ulcer/skin care regimen upon admission and as needed Provide education on ulcer and skin care Treatment Activities: Skin care regimen initiated : 12/13/2021 Topical wound management initiated : 12/13/2021 Notes: Electronic Signature(s) Signed: 01/17/2022 5:29:55 PM By: Deon Pilling RN, BSN Entered By: Deon Pilling on 01/17/2022 14:35:44 -------------------------------------------------------------------------------- Pain Assessment Details Patient Name: Date of Service: [name] 01/17/2022 2:15 PM Medical Record Number: [id number] Patient Account Number: [id number] Date of Birth/Sex: Treating RN: [birth year] (56 y.o. [name] Primary Care Sadiq Mccauley: Pricilla Holm Other Clinician: Referring Kyley Laurel: Treating Encarnacion Bole/Extender: Dreama Saa in Treatment: 5 Active  Problems Location of Pain Severity and Description of Pain Patient Has Paino No Site Locations Rate the pain. Current Pain Level: 0 Pain Management and Medication Current Pain Management: Medication: No Cold Application: No Rest: No Massage: No Activity: No T.E.N.S.: No Heat Application: No Leg drop or elevation: No Is the Current Pain Management Adequate: Adequate How does your wound impact your activities of daily livingo Sleep: No Bathing: No Appetite: No Relationship With Others: No Bladder Continence: No Emotions: No Bowel Continence: No Work: No Toileting: No Drive: No Dressing: No Hobbies: No Notes per patient at times burning at top of toes. Electronic Signature(s) Signed: 01/17/2022 5:29:55 PM By: Deon Pilling RN, BSN Entered By: Deon Pilling on 01/17/2022 14:11:21 -------------------------------------------------------------------------------- Patient/Caregiver Education Details Patient Name: Date of Service: [name] 5/31/2023andnbsp2:15 PM Medical Record Number: [id number] Patient Account Number: [id number] Date of Birth/Gender: Treating RN: 11-Apr-1966 (55 y.o. [name] Primary Care Physician: Pricilla Holm Other Clinician: Referring Physician: Treating Physician/Extender: Dreama Saa in Treatment: 5 Education Assessment Education Provided To: Patient Education Topics Provided Wound/Skin Impairment: Handouts: Skin Care Do's and Dont's Methods: Explain/Verbal Responses: Reinforcements needed Electronic Signature(s) Signed: 01/17/2022 5:29:55 PM By: Deon Pilling RN, BSN Entered By: Deon Pilling on 01/17/2022 14:35:58 -------------------------------------------------------------------------------- Wound Assessment Details Patient Name: Date of Service: [name] 01/17/2022 2:15 PM Medical Record Number: [id number] Patient Account Number: [id number] Date of Birth/Sex: Treating  RN: 1966-02-14 (55 y.o. [name], Meta.Reding Primary Care Lou Loewe: Pricilla Holm Other Clinician: Referring Keelen Quevedo: Treating Carolan Avedisian/Extender: Dreama Saa in Treatment: 5 Wound Status Wound Number: 2 Primary Etiology: Diabetic Wound/Ulcer of the Lower Extremity Wound Location: Right T Great oe Wound Status: Open Wounding Event: Blister Comorbid History: Hypertension, Type II Diabetes, Neuropathy Date Acquired: 11/22/2021 Weeks Of Treatment: 5 Clustered Wound: No Photos Wound Measurements Length: (cm) 1.1 Width: (cm) 1 Depth: (cm) 0.1 Area: (cm) 0.864 Volume: (cm) 0.086 % Reduction in Area: 76.1% % Reduction in Volume: 76.2% Epithelialization: Medium (34-66%) Tunneling: No Undermining: No Wound Description Classification: Grade 2 Wound Margin: Distinct, outline attached Exudate Amount: Medium Exudate Type: Serosanguineous Exudate Color: red, brown Foul Odor After Cleansing: No Slough/Fibrino No Wound Bed Granulation Amount: Large (67-100%) Exposed Structure Granulation Quality: Red, Pink, Hyper-granulation Fascia Exposed: No Necrotic Amount: None Present (0%) Fat Layer (Subcutaneous Tissue) Exposed: Yes Tendon Exposed: No Muscle Exposed: No Joint Exposed: No Bone Exposed: No Treatment Notes Wound #2 (Toe Great) Wound Laterality: Right Cleanser Normal Saline Discharge Instruction: Cleanse the wound with Normal Saline prior to applying a clean dressing using gauze sponges, not tissue or cotton balls. Vashe, 8.5 (oz) Discharge Instruction: use to wash the toe with vashe and gauze. Peri-Wound Care Topical Primary Dressing Hydrofera Blue Ready  Foam, 2.5 x2.5 in Discharge Instruction: Apply to wound bed as instructed Secondary Dressing Woven Gauze Sponges 2x2 in Discharge Instruction: Apply over primary dressing as directed. Secured With Conforming Stretch Gauze Bandage, Sterile 2x75 (in/in) Discharge Instruction: Secure  with stretch gauze as directed. 33M Medipore H Soft Cloth Surgical T ape, 4 x 10 (in/yd) Discharge Instruction: Secure with tape as directed. Compression Wrap Compression Stockings Add-Ons Electronic Signature(s) Signed: 01/17/2022 5:29:55 PM By: Deon Pilling RN, BSN Entered By: Deon Pilling on 01/17/2022 14:16:41 -------------------------------------------------------------------------------- Vitals Details Patient Name: Date of Service: [name] 01/17/2022 2:15 PM Medical Record Number: [id number] Patient Account Number: [id number] Date of Birth/Sex: Treating RN: 11-06-1965 (56 y.o. [name], [name] Primary Care Adison Jerger: Pricilla Holm Other Clinician: Referring Eliab Closson: Treating Ashraf Mesta/Extender: Dreama Saa in Treatment: 5 Vital Signs Time Taken: 14:07 Temperature (F): 98.5 Height (in): 66 Pulse (bpm): 87 Weight (lbs): 220 Respiratory Rate (breaths/min): 16 Body Mass Index (BMI): 35.5 Blood Pressure (mmHg): 138/75 Reference Range: 80 - 120 mg / dl Electronic Signature(s) Signed: 01/17/2022 5:29:55 PM By: Deon Pilling RN, BSN Entered By: Deon Pilling on 01/17/2022 14:10:14

## 2022-01-24 ENCOUNTER — Encounter (HOSPITAL_BASED_OUTPATIENT_CLINIC_OR_DEPARTMENT_OTHER): Payer: 59 | Attending: Physician Assistant | Admitting: Internal Medicine

## 2022-01-24 DIAGNOSIS — Z9889 Other specified postprocedural states: Secondary | ICD-10-CM | POA: Insufficient documentation

## 2022-01-24 DIAGNOSIS — L97512 Non-pressure chronic ulcer of other part of right foot with fat layer exposed: Secondary | ICD-10-CM | POA: Diagnosis not present

## 2022-01-24 DIAGNOSIS — L97812 Non-pressure chronic ulcer of other part of right lower leg with fat layer exposed: Secondary | ICD-10-CM | POA: Insufficient documentation

## 2022-01-24 DIAGNOSIS — I10 Essential (primary) hypertension: Secondary | ICD-10-CM | POA: Insufficient documentation

## 2022-01-24 DIAGNOSIS — E11621 Type 2 diabetes mellitus with foot ulcer: Secondary | ICD-10-CM | POA: Insufficient documentation

## 2022-01-24 DIAGNOSIS — E1142 Type 2 diabetes mellitus with diabetic polyneuropathy: Secondary | ICD-10-CM | POA: Diagnosis not present

## 2022-01-24 DIAGNOSIS — Z872 Personal history of diseases of the skin and subcutaneous tissue: Secondary | ICD-10-CM | POA: Insufficient documentation

## 2022-01-24 NOTE — Progress Notes (Signed)
Donna [name], Donna [name] ([id number]) Visit Report for 01/24/2022 HPI Details Patient Name: Date of Service: Donna [name], Donna [name] 01/24/2022 8:15 A M Medical Record Number: [id number] Patient Account Number: [id number] Date of Birth/Sex: Treating RN: 1966/05/12 (56 y.o. 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 She tells me that since at least last summer she has had a progressive callused area that has opened closed but is never really healed. She has a history of calluses on her feet. She is a diabetic with neuropathy. She was referred to Dr. Posey Pronto at [facility] who felt that this might be a large plantar wart she was treated with laser for 2 treatments and then subsequently with ammonium lactate none of this is apparently made any difference according to the patient. She has a very odd looking area with thick skin and callus and an almost crossed like area of open tissue. She also has erythema above this which she says is chronic. The other interesting part of her recent history is recurrent cellulitis on the right lower leg although this is separated from the area we are looking at. She has had trips to see infectious disease has been on Keflex for as long as 20 days. This is presumed to be strep. She saw Dr. Gale Journey Past medical history includes type 2 diabetes, recurrent cellulitis of the right leg, right Achilles tendon repair a year ago for spontaneous rupture ABI in our clinic was 1.09 on the right 2/17; patient admitted the clinic last week had a thick callused area on the right lateral heel she is a diabetic with neuropathy. She had thick callus and across like area of open wound. I aggressively debrided  this area to remove most of the callus and gave her Hydrofera Blue and heel off loader, heel cup and she has been wearing this religiously trying to keep the pressure off the area. She comes in today with a cross-like area of injury has healed she has a new open area that I do not remember from last time. Still some dry flaking skin and callus but not nearly as much as last week at which time I had to use a scalpel to remove this. 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 She came in 2 weeks ago with a very thick hyperkeratotic callused area on her right lateral heel. There was a "crossed" shaped area of split tissue with an open wound at the bottom. This had previously been treated as a possible wart without improvement. I removed all of the callus when she first came in here. This is mostly epithelialized over still thick skin but nothing like it was. She has 1 small open area that has not healed she has been more religious about offloading the area 10/27/2020 upon evaluation today patient appears to be doing well with regard to her wound although she does still apparently appear to have something still open in the base of the wound. Fortunately there is no evidence of infection at this time which is great news and in general I feel like she is doing decently well though she has a lot of callus buildup she is using the offloading shoe for her heel. At home she does not always wear this she tells me. Generally  she is using flip-flops there. Of note her primary care provider did give her Keflex yesterday for cellulitis of this leg but has nothing to do with the heel location. 3/17; the patient's area actually looks a lot better. There is no open wound here. Much less callus. She was given a prescription for 40% urea cream I am not sure that really is required this week. She is going to require less friction on this area. I think she is going to need a different type of shoe  going forward Readmission: 12-13-2028 upon evaluation today patient presents for reevaluation in the clinic she was last seen by Dr. Dellia Nims on 11-03-2020 when she was discharged this was due to a crack on her heel. Currently she is actually having an issue with her great toe on the right foot. She tells me that this has been present currently for a couple of weeks since around November 22, 2021. She has had a lot of callus over this area in general but not an open wound until that time. Subsequently this has more recently opened and has Become a greater concern she is also noted some odor which is disconcerting to her. She tells me that her greatest concern is that "she does not lose this toe". Again I completely understand her concern in this regard I definitely think we need to be aggressive in treating this and try to get things better. She is very Patent attorney. Patient does have a history of diabetes mellitus type 2 with diabetic neuropathy. She also tells me that she does have a history as well of hypertension which is pretty well controlled. She has not yet had an x-ray of the area and has not been on any antibiotics. Both are things I think we will need to do today. 12-20-2021 upon evaluation today patient appears to be doing well with regard to her toe ulcer all things considered. She has been tolerating the dressing changes without complication. I do have her x-ray as well as her culture for review today the culture did so Staphylococcus aureus no MRSA noted which was great news susceptible to doxycycline which is what I did place her on last week. Overall the odor that she was noticing has also cleared which is great news. With regard to the x-ray there does not appear to be any signs of osteomyelitis or bone erosion at this point which is also great news in general and very pleased with where things stand. 12-27-2021 upon evaluation today patient appears to be doing well with regard to her wound  this is actually measuring significantly better which is great news. Overall I am extremely pleased with where we stand today. No fevers, chills, nausea, vomiting, or diarrhea. 01-03-2022 upon evaluation today patient's wound is actually showing signs of significant improvement which is also news. I am very pleased with what we are seeing there is a little bit of callus buildup still but we are going to work on that today otherwise she seems to be making excellent progress which is great news. 01-10-2022 upon evaluation today patient appears to be doing well currently in regard to her toe ulcer. She has been tolerating the dressing changes without complication. Fortunately there does not appear to be any evidence of active infection locally or systemically which is great news. No fevers, chills, nausea, vomiting, or diarrhea. 01-17-2022 upon evaluation today patient's toe was actually doing about the same this week there is not a tremendous improvement in size but overall the  appearance of the wound is better it is much flatter I do think the [facility] has done well in that regard. We will still wait to hear back on the skin substitute. With that being said I do believe that the patient could benefit from again the skin substitute to try to help get this to heal much more effectively and quickly in the meantime I am trying to help flatten this out which I think is doing a pretty good job.. 6/7; wound on the plantar right great toe over the distal phalanx. Wound is slightly smaller but looks healthier there is rims of epithelialization she still has some callus on the toe indicative of inadequate pressure/friction relief. I talked to her about making sure in the forefoot offloading boot that the weight stays on her heel in stance phase Electronic Signature(s) Signed: 01/24/2022 5:09:19 PM By: Linton Ham MD Entered By: Linton Ham on 01/24/2022  08:46:25 -------------------------------------------------------------------------------- Physical Exam Details Patient Name: Date of Service: Donna [name] 01/24/2022 8:15 A M Medical Record Number: [id number] Patient Account Number: [id number] Date of Birth/Sex: Treating RN: [birth year] (56 y.o. [name] Primary Care Provider: Pricilla Holm Other Clinician: Referring Provider: Treating Provider/Extender: Charlie Pitter in Treatment: 6 Constitutional Sitting or standing Blood Pressure is within target range for patient.. Pulse regular and within target range for patient.Marland Kitchen Respirations regular, non-labored and within target range.. Temperature is normal and within the target range for the patient.Marland Kitchen Appears in no distress. Notes Wound exam; plantar aspect of the right great toe over the distal phalanx. There is callus around the periwound indicative of inadequate pressure/friction relief the surface of the wound however looks quite good and under illumination some epithelialization around the wound edge no debridement is necessary. No evidence of infection Electronic Signature(s) Signed: 01/24/2022 5:09:19 PM By: Linton Ham MD Entered By: Linton Ham on 01/24/2022 08:50:06 -------------------------------------------------------------------------------- Physician Orders Details Patient Name: Date of Service: Donna [name] 01/24/2022 8:15 A M Medical Record Number: [id number] Patient Account Number: [id number] Date of Birth/Sex: Treating RN: Jun 27, 1966 (56 y.o. [name], [name] Primary Care Provider: Pricilla Holm Other Clinician: Referring Provider: Treating Provider/Extender: Charlie Pitter in Treatment: 6 Verbal / Phone Orders: No Diagnosis Coding ICD-10 Coding Code Description E11.621 Type 2 diabetes mellitus with foot ulcer L97.812 Non-pressure chronic ulcer of other part of right lower leg  with fat layer exposed E11.42 Type 2 diabetes mellitus with diabetic polyneuropathy I10 Essential (primary) hypertension Follow-up Appointments ppointment in 1 week. Jeri Cos, [place] and [place], Room 6 Wednesday 01/31/2022 change to overflow room at 1230 Return A Jeri Cos, [place] and [place], Room 8 Wednesday 02/07/2022 0815 Cellular or Tissue Based Products Wound #2 Right T Great oe Cellular or Tissue Based Product Type: - 01/10/2022 Epifix run insurance authorization 01/17/2022-pending 6/7/223 pending Bathing/ Shower/ Hygiene May shower with protection but do not get wound dressing(s) wet. - May purchase cast protector from [facility], [facility], or [employer] Edema Control - Lymphedema / SCD / Other Elevate legs to the level of the heart or above for 30 minutes daily and/or when sitting, a frequency of: - 3-4 times a day throughout the day. Avoid standing for long periods of time. Off-Loading Wedge shoe to: - wear when walking and standing. No bare feet. Wound Treatment Wound #2 - T Great oe Wound Laterality: Right Cleanser: Normal Saline 3 x Per Week/30 Days Discharge Instructions: Cleanse the wound with Normal Saline prior to applying a clean dressing using gauze  sponges, not tissue or cotton balls. Cleanser: Vashe, 8.5 (oz) (Generic) 3 x Per Week/30 Days Discharge Instructions: use to wash the toe with vashe and gauze. Prim Dressing: Hydrofera Blue Ready Foam, 2.5 x2.5 in 3 x Per Week/30 Days ary Discharge Instructions: Apply to wound bed as instructed Secondary Dressing: Woven Gauze Sponges 2x2 in (Generic) 3 x Per Week/30 Days Discharge Instructions: Apply over primary dressing as directed. Secured With: Child psychotherapist, Sterile 2x75 (in/in) (Generic) 3 x Per Week/30 Days Discharge Instructions: Secure with stretch gauze as directed. Secured With: 58M Medipore H Soft Cloth Surgical T ape, 4 x 10 (in/yd) (Generic) 3 x Per Week/30 Days Discharge Instructions: Secure with tape as  directed. Electronic Signature(s) Signed: 01/24/2022 5:06:31 PM By: Deon Pilling RN, BSN Signed: 01/24/2022 5:09:19 PM By: Linton Ham MD Entered By: Deon Pilling on 01/24/2022 08:41:03 -------------------------------------------------------------------------------- Problem List Details Patient Name: Date of Service: Donna [name] 01/24/2022 8:15 A M Medical Record Number: [id number] Patient Account Number: [id number] Date of Birth/Sex: Treating RN: [birth year] (56 y.o. [name], [name] Primary Care Provider: Pricilla Holm Other Clinician: Referring Provider: Treating Provider/Extender: Charlie Pitter in Treatment: 6 Active Problems ICD-10 Encounter Code Description Active Date MDM Diagnosis E11.621 Type 2 diabetes mellitus with foot ulcer 12/13/2021 No Yes L97.812 Non-pressure chronic ulcer of other part of right lower leg with fat layer 12/13/2021 No Yes exposed E11.42 Type 2 diabetes mellitus with diabetic polyneuropathy 12/13/2021 No Yes I10 Essential (primary) hypertension 12/13/2021 No Yes Inactive Problems Resolved Problems Electronic Signature(s) Signed: 01/24/2022 5:09:19 PM By: Linton Ham MD Entered By: Linton Ham on 01/24/2022 08:44:57 -------------------------------------------------------------------------------- Progress Note Details Patient Name: Date of Service: Donna [name] 01/24/2022 8:15 A M Medical Record Number: [id number] Patient Account Number: [id number] Date of Birth/Sex: Treating RN: Jul 09, 1966 (56 y.o. 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 She tells me that since at least last summer she has had a  progressive callused area that has opened closed but is never really healed. She has a history of calluses on her feet. She is a diabetic with neuropathy. She was referred to Dr. Posey Pronto at [facility] who felt that this might be a large plantar wart she was treated with laser for 2 treatments and then subsequently with ammonium lactate none of this is apparently made any difference according to the patient. She has a very odd looking area with thick skin and callus and an almost crossed like area of open tissue. She also has erythema above this which she says is chronic. The other interesting part of her recent history is recurrent cellulitis on the right lower leg although this is separated from the area we are looking at. She has had trips to see infectious disease has been on Keflex for as long as 20 days. This is presumed to be strep. She saw Dr. Gale Journey Past medical history includes type 2 diabetes, recurrent cellulitis of the right leg, right Achilles tendon repair a year ago for spontaneous rupture ABI in our clinic was 1.09 on the right 2/17; patient admitted the clinic last week had a thick callused area on the right lateral heel she is a diabetic with neuropathy. She had thick callus and across like area of open wound. I aggressively  debrided this area to remove most of the callus and gave her Hydrofera Blue and heel off loader, heel cup and she has been wearing this religiously trying to keep the pressure off the area. She comes in today with a cross-like area of injury has healed she has a new open area that I do not remember from last time. Still some dry flaking skin and callus but not nearly as much as last week at which time I had to use a scalpel to remove this. 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 She came in 2 weeks ago with a very thick hyperkeratotic callused area on her right lateral heel. There was a "crossed" shaped area of split tissue with an  open wound at the bottom. This had previously been treated as a possible wart without improvement. I removed all of the callus when she first came in here. This is mostly epithelialized over still thick skin but nothing like it was. She has 1 small open area that has not healed she has been more religious about offloading the area 10/27/2020 upon evaluation today patient appears to be doing well with regard to her wound although she does still apparently appear to have something still open in the base of the wound. Fortunately there is no evidence of infection at this time which is great news and in general I feel like she is doing decently well though she has a lot of callus buildup she is using the offloading shoe for her heel. At home she does not always wear this she tells me. Generally she is using flip-flops there. Of note her primary care provider did give her Keflex yesterday for cellulitis of this leg but has nothing to do with the heel location. 3/17; the patient's area actually looks a lot better. There is no open wound here. Much less callus. She was given a prescription for 40% urea cream I am not sure that really is required this week. She is going to require less friction on this area. I think she is going to need a different type of shoe going forward Readmission: 12-13-2028 upon evaluation today patient presents for reevaluation in the clinic she was last seen by Dr. Dellia Nims on 11-03-2020 when she was discharged this was due to a crack on her heel. Currently she is actually having an issue with her great toe on the right foot. She tells me that this has been present currently for a couple of weeks since around November 22, 2021. She has had a lot of callus over this area in general but not an open wound until that time. Subsequently this has more recently opened and has Become a greater concern she is also noted some odor which is disconcerting to her. She tells me that her greatest concern is  that "she does not lose this toe". Again I completely understand her concern in this regard I definitely think we need to be aggressive in treating this and try to get things better. She is very Patent attorney. Patient does have a history of diabetes mellitus type 2 with diabetic neuropathy. She also tells me that she does have a history as well of hypertension which is pretty well controlled. She has not yet had an x-ray of the area and has not been on any antibiotics. Both are things I think we will need to do today. 12-20-2021 upon evaluation today patient appears to be doing well with regard to her toe ulcer all  things considered. She has been tolerating the dressing changes without complication. I do have her x-ray as well as her culture for review today the culture did so Staphylococcus aureus no MRSA noted which was great news susceptible to doxycycline which is what I did place her on last week. Overall the odor that she was noticing has also cleared which is great news. With regard to the x-ray there does not appear to be any signs of osteomyelitis or bone erosion at this point which is also great news in general and very pleased with where things stand. 12-27-2021 upon evaluation today patient appears to be doing well with regard to her wound this is actually measuring significantly better which is great news. Overall I am extremely pleased with where we stand today. No fevers, chills, nausea, vomiting, or diarrhea. 01-03-2022 upon evaluation today patient's wound is actually showing signs of significant improvement which is also news. I am very pleased with what we are seeing there is a little bit of callus buildup still but we are going to work on that today otherwise she seems to be making excellent progress which is great news. 01-10-2022 upon evaluation today patient appears to be doing well currently in regard to her toe ulcer. She has been tolerating the dressing changes without complication.  Fortunately there does not appear to be any evidence of active infection locally or systemically which is great news. No fevers, chills, nausea, vomiting, or diarrhea. 01-17-2022 upon evaluation today patient's toe was actually doing about the same this week there is not a tremendous improvement in size but overall the appearance of the wound is better it is much flatter I do think the [facility] has done well in that regard. We will still wait to hear back on the skin substitute. With that being said I do believe that the patient could benefit from again the skin substitute to try to help get this to heal much more effectively and quickly in the meantime I am trying to help flatten this out which I think is doing a pretty good job.. 6/7; wound on the plantar right great toe over the distal phalanx. Wound is slightly smaller but looks healthier there is rims of epithelialization she still has some callus on the toe indicative of inadequate pressure/friction relief. I talked to her about making sure in the forefoot offloading boot that the weight stays on her heel in stance phase Objective Constitutional Sitting or standing Blood Pressure is within target range for patient.. Pulse regular and within target range for patient.Marland Kitchen Respirations regular, non-labored and within target range.. Temperature is normal and within the target range for the patient.Marland Kitchen Appears in no distress. Vitals Time Taken: 8:24 AM, Height: 66 in, Weight: 220 lbs, BMI: 35.5, Temperature: 97.9 F, Pulse: 74 bpm, Respiratory Rate: 20 breaths/min, Blood Pressure: 118/76 mmHg. General Notes: Wound exam; plantar aspect of the right great toe over the distal phalanx. There is callus around the periwound indicative of inadequate pressure/friction relief the surface of the wound however looks quite good and under illumination some epithelialization around the wound edge no debridement is necessary. No evidence of  infection Integumentary (Hair, Skin) Wound #2 status is Open. Original cause of wound was Blister. The date acquired was: 11/22/2021. The wound has been in treatment 6 weeks. The wound is located on the Right T Great. The wound measures 1cm length x 1cm width x 0.1cm depth; 0.785cm^2 area and 0.079cm^3 volume. There is Fat Layer oe (Subcutaneous Tissue) exposed.  There is no tunneling or undermining noted. There is a medium amount of serosanguineous drainage noted. The wound margin is distinct with the outline attached to the wound base. There is large (67-100%) red, pink, friable, hyper - granulation within the wound bed. There is no necrotic tissue within the wound bed. Assessment Active Problems ICD-10 Type 2 diabetes mellitus with foot ulcer Non-pressure chronic ulcer of other part of right lower leg with fat layer exposed Type 2 diabetes mellitus with diabetic polyneuropathy Essential (primary) hypertension Plan Follow-up Appointments: Return Appointment in 1 week. Jeri Cos, [place] and [place], Room 6 Wednesday 01/31/2022 change to overflow room at [place], [place] and [place], Room 8 Wednesday 02/07/2022 0815 Cellular or Tissue Based Products: Wound #2 Right T Great: oe Cellular or Tissue Based Product Type: - 01/10/2022 Epifix run insurance authorization 01/17/2022-pending 6/7/223 pending Bathing/ Shower/ Hygiene: May shower with protection but do not get wound dressing(s) wet. - May purchase cast protector from [facility], [facility], or [employer] Edema Control - Lymphedema / SCD / Other: Elevate legs to the level of the heart or above for 30 minutes daily and/or when sitting, a frequency of: - 3-4 times a day throughout the day. Avoid standing for long periods of time. Off-Loading: Wedge shoe to: - wear when walking and standing. No bare feet. WOUND #2: - T Great Wound Laterality: Right oe Cleanser: Normal Saline 3 x Per Week/30 Days Discharge Instructions: Cleanse the wound with Normal Saline  prior to applying a clean dressing using gauze sponges, not tissue or cotton balls. Cleanser: Vashe, 8.5 (oz) (Generic) 3 x Per Week/30 Days Discharge Instructions: use to wash the toe with vashe and gauze. Prim Dressing: Hydrofera Blue Ready Foam, 2.5 x2.5 in 3 x Per Week/30 Days ary Discharge Instructions: Apply to wound bed as instructed Secondary Dressing: Woven Gauze Sponges 2x2 in (Generic) 3 x Per Week/30 Days Discharge Instructions: Apply over primary dressing as directed. Secured With: Child psychotherapist, Sterile 2x75 (in/in) (Generic) 3 x Per Week/30 Days Discharge Instructions: Secure with stretch gauze as directed. Secured With: 52M Medipore H Soft Cloth Surgical T ape, 4 x 10 (in/yd) (Generic) 3 x Per Week/30 Days Discharge Instructions: Secure with tape as directed. 1. I continued with the Hydrofera Blue 2. T to her in some detail about the use of the wedge off loader. Unless she keeps the weight back on her heel this is not going to be useful in any form of alk forefoot pressure relief. 3. I did mention to her about a total contact cast which would create a lot of problems not least of which driving. Electronic Signature(s) Signed: 01/24/2022 5:09:19 PM By: Linton Ham MD Entered By: Linton Ham on 01/24/2022 08:51:12 -------------------------------------------------------------------------------- SuperBill Details Patient Name: Date of Service: Donna [name] 01/24/2022 Medical Record Number: [id number] Patient Account Number: [id number] Date of Birth/Sex: Treating RN: [birth year] (56 y.o. [name] Primary Care Provider: Pricilla Holm Other Clinician: Referring Provider: Treating Provider/Extender: Charlie Pitter in Treatment: 6 Diagnosis Coding ICD-10 Codes Code Description [phone] Type 2 diabetes mellitus with foot ulcer L97.812 Non-pressure chronic ulcer of other part of right lower leg with fat layer  exposed E11.42 Type 2 diabetes mellitus with diabetic polyneuropathy I10 Essential (primary) hypertension Facility Procedures CPT4 Code: [id number] Description: 99213 - WOUND CARE VISIT-LEV 3 EST PT Modifier: Quantity: 1 Physician Procedures : CPT4 Code Description Modifier [id number] - WC PHYS LEVEL 3 - EST PT ICD-10 Diagnosis Description E11.621 Type 2  diabetes mellitus with foot ulcer L97.812 Non-pressure chronic ulcer of other part of right lower leg with fat layer exposed Quantity: 1 Electronic Signature(s) Signed: 01/24/2022 5:09:19 PM By: Linton Ham MD Entered By: Linton Ham on 01/24/2022 08:51:31

## 2022-01-24 NOTE — Progress Notes (Signed)
Donna, [name] ([id number]) Visit Report for 01/24/2022 Arrival Information Details Patient Name: Date of Service: Donna [name], Donna [name] 01/24/2022 8:15 A M Medical Record Number: [id number] Patient Account Number: [id number] Date of Birth/Sex: Treating RN: 08-05-1966 (56 y.o. [name], [name] Primary Care Yilin Weedon: Pricilla Holm Other Clinician: Referring Jkayla Spiewak: Treating Keith Felten/Extender: Charlie Pitter in Treatment: 6 Visit Information History Since Last Visit Added or deleted any medications: No Patient Arrived: Ambulatory Any new allergies or adverse reactions: No Arrival Time: 08:24 Had a fall or experienced change in No Accompanied By: self activities of daily living that may affect Transfer Assistance: None risk of falls: Patient Identification Verified: Yes Signs or symptoms of abuse/neglect since last visito No Secondary Verification Process Completed: Yes Hospitalized since last visit: No Patient Requires Transmission-Based Precautions: No Implantable device outside of the clinic excluding No Patient Has Alerts: No cellular tissue based products placed in the center since last visit: Has Dressing in Place as Prescribed: Yes Has Footwear/Offloading in Place as Prescribed: Yes Right: Wedge Shoe Pain Present Now: No Electronic Signature(s) Signed: 01/24/2022 5:06:31 PM By: Deon Pilling RN, BSN Entered By: Deon Pilling on 01/24/2022 08:25:39 -------------------------------------------------------------------------------- Clinic Level of Care Assessment Details Patient Name: Date of Service: Donna, [name] 01/24/2022 8:15 A M Medical Record Number: [id number] Patient Account Number: [id number] Date of Birth/Sex: Treating RN: [birth year] (56 y.o. [name], [name] Primary Care Avigail Pilling: Pricilla Holm Other Clinician: Referring Tom Macpherson: Treating Travez Stancil/Extender: Charlie Pitter in Treatment: 6 Clinic  Level of Care Assessment Items TOOL 4 Quantity Score X- 1 0 Use when only an EandM is performed on FOLLOW-UP visit ASSESSMENTS - Nursing Assessment / Reassessment X- 1 10 Reassessment of Co-morbidities (includes updates in patient status) X- 1 5 Reassessment of Adherence to Treatment Plan ASSESSMENTS - Wound and Skin A ssessment / Reassessment X - Simple Wound Assessment / Reassessment - one wound 1 5 _0  - 0 Complex Wound Assessment / Reassessment - multiple wounds X- 1 10 Dermatologic / Skin Assessment (not related to wound area) ASSESSMENTS - Focused Assessment X- 1 5 Circumferential Edema Measurements - multi extremities X- 1 10 Nutritional Assessment / Counseling / Intervention _1  - 0 Lower Extremity Assessment (monofilament, tuning fork, pulses) _2  - 0 Peripheral Arterial Disease Assessment (using hand held doppler) ASSESSMENTS - Ostomy and/or Continence Assessment and Care _3  - 0 Incontinence Assessment and Management _4  - 0 Ostomy Care Assessment and Management (repouching, etc.) PROCESS - Coordination of Care X - Simple Patient / Family Education for ongoing care 1 15 _5  - 0 Complex (extensive) Patient / Family Education for ongoing care X- 1 10 Staff obtains Programmer, systems, Records, T Results / Process Orders est _6  - 0 Staff telephones HHA, Nursing Homes / Clarify orders / etc _7  - 0 Routine Transfer to another Facility (non-emergent condition) _8  - 0 Routine Hospital Admission (non-emergent condition) _9  - 0 New Admissions / Biomedical engineer / Ordering NPWT Apligraf, etc. , _10  - 0 Emergency Hospital Admission (emergent condition) X- 1 10 Simple Discharge Coordination _11  - 0 Complex (extensive) Discharge Coordination PROCESS - Special Needs _12  - 0 Pediatric / Minor Patient Management _13  - 0 Isolation Patient Management _14  - 0 Hearing / Language / Visual special needs _15  - 0 Assessment of Community assistance (transportation, D/C planning,  etc.) _16  - 0 Additional assistance / Altered mentation _17  - 0 Support Surface(s) Assessment (bed, cushion, seat, etc.) INTERVENTIONS - Wound Cleansing / Measurement X - Simple Wound Cleansing - one wound 1  5 _0  - 0 Complex Wound Cleansing - multiple wounds X- 1 5 Wound Imaging (photographs - any number of wounds) _1  - 0 Wound Tracing (instead of photographs) X- 1 5 Simple Wound Measurement - one wound _2  - 0 Complex Wound Measurement - multiple wounds INTERVENTIONS - Wound Dressings X - Small Wound Dressing one or multiple wounds 1 10 _3  - 0 Medium Wound Dressing one or multiple wounds _4  - 0 Large Wound Dressing one or multiple wounds <YNWGNFAOZHYQMVHQ>_4<\/ONGEXBMWUXLKGMWN>_0  - 0 Application of Medications - topical <UVOZDGUYQIHKVQQV>_9<\/DGLOVFIEPPIRJJOA>_4  - 0 Application of Medications - injection INTERVENTIONS - Miscellaneous _7  - 0 External ear exam _8  - 0 Specimen Collection (cultures, biopsies, blood, body fluids, etc.) _9  - 0 Specimen(s) / Culture(s) sent or taken to Lab for analysis _10  - 0 Patient Transfer (multiple staff / Civil Service fast streamer / Similar devices) _11  - 0 Simple Staple / Suture removal (25 or less) _12  - 0 Complex Staple / Suture removal (26 or more) _13  - 0 Hypo / Hyperglycemic Management (close monitor of Blood Glucose) _14  - 0 Ankle / Brachial Index (ABI) - do not check if billed separately X- 1 5 Vital Signs Has the patient been seen at the hospital within the last three years: Yes Total Score: 110 Level Of Care: New/Established - Level 3 Electronic Signature(s) Signed: 01/24/2022 5:06:31 PM By: Deon Pilling RN, BSN Entered By: Deon Pilling on 01/24/2022 08:41:33 -------------------------------------------------------------------------------- Encounter Discharge Information Details Patient Name: Date of Service: [name] 01/24/2022 8:15 A M Medical Record Number: [id number] Patient Account Number: [id number] Date of Birth/Sex: Treating RN: 1966-06-11 (56 y.o. [name] Primary Care Brookie Wayment: Pricilla Holm Other Clinician: Referring Fonda Rochon: Treating Murial Beam/Extender: Charlie Pitter in Treatment: 6 Encounter Discharge Information Items Discharge Condition: Stable Ambulatory Status: Ambulatory Discharge Destination: Home Transportation: Private Auto Accompanied By: self Schedule Follow-up Appointment: Yes Clinical Summary of Care: Electronic Signature(s) Signed: 01/24/2022 5:06:31 PM By: Deon Pilling RN, BSN Entered By: Deon Pilling on 01/24/2022 08:42:18 -------------------------------------------------------------------------------- Lower Extremity Assessment Details Patient Name: Date of Service: AARIA, HAPP 01/24/2022 8:15 A M Medical Record Number: [id number] Patient Account Number: [id number] Date of Birth/Sex: Treating RN: 06-11-1966 (56 y.o. [name] Primary Care Sundra Haddix: Pricilla Holm Other Clinician: Referring Sophiya Morello: Treating Rondel Episcopo/Extender: Charlie Pitter in Treatment: 6 Edema Assessment Assessed: Shirlyn Goltz: No] [Right: Yes] Edema: [Left: N] [Right: o] Calf Left: Right: Point of Measurement: 28 cm From Medial Instep 36 cm Ankle Left: Right: Point of Measurement: 8 cm From Medial Instep 22 cm Vascular Assessment Pulses: Dorsalis Pedis Palpable: [Right:Yes] Electronic Signature(s) Signed: 01/24/2022 5:06:31 PM By: Deon Pilling RN, BSN Entered By: Deon Pilling on 01/24/2022 08:27:33 -------------------------------------------------------------------------------- Multi Wound Chart Details Patient Name: Date of Service: [name] 01/24/2022 8:15 A M Medical Record Number: [id number] Patient Account Number: [id number] Date of Birth/Sex: Treating RN: [birth year] (56 y.o. [name] Primary Care Camay Pedigo: Pricilla Holm Other Clinician: Referring Samiha Denapoli: Treating Worley Radermacher/Extender: Charlie Pitter in Treatment: 6 Vital  Signs Height(in): 34 Pulse(bpm): 38 Weight(lbs): 220 Blood Pressure(mmHg): 118/76 Body Mass Index(BMI): 35.5 Temperature(F): 97.9 Respiratory Rate(breaths/min): 20 Photos: [N/A:N/A] Right T Great oe N/A N/A Wound Location: Blister N/A N/A Wounding Event: Diabetic Wound/Ulcer of the Lower N/A N/A Primary Etiology: Extremity Hypertension, Type II Diabetes, N/A N/A Comorbid History: Neuropathy 11/22/2021 N/A N/A Date Acquired: 6 N/A N/A Weeks of Treatment: Open N/A N/A Wound Status: No N/A N/A Wound Recurrence: 1x1x0.1 N/A N/A Measurements L x W x D (cm)  0.785 N/A N/A A (cm) : rea 0.079 N/A N/A Volume (cm) : 78.30% N/A N/A % Reduction in A rea: 78.10% N/A N/A % Reduction in Volume: Grade 2 N/A N/A Classification: Medium N/A N/A Exudate A mount: Serosanguineous N/A N/A Exudate Type: red, brown N/A N/A Exudate Color: Distinct, outline attached N/A N/A Wound Margin: Large (67-100%) N/A N/A Granulation A mount: Red, Pink, Hyper-granulation, Friable N/A N/A Granulation Quality: None Present (0%) N/A N/A Necrotic A mount: Fat Layer (Subcutaneous Tissue): Yes N/A N/A Exposed Structures: Fascia: No Tendon: No Muscle: No Joint: No Bone: No Medium (34-66%) N/A N/A Epithelialization: Treatment Notes Wound #2 (Toe Great) Wound Laterality: Right Cleanser Normal Saline Discharge Instruction: Cleanse the wound with Normal Saline prior to applying a clean dressing using gauze sponges, not tissue or cotton balls. Vashe, 8.5 (oz) Discharge Instruction: use to wash the toe with vashe and gauze. Peri-Wound Care Topical Primary Dressing Hydrofera Blue Ready Foam, 2.5 x2.5 in Discharge Instruction: Apply to wound bed as instructed Secondary Dressing Woven Gauze Sponges 2x2 in Discharge Instruction: Apply over primary dressing as directed. Secured With Conforming Stretch Gauze Bandage, Sterile 2x75 (in/in) Discharge Instruction: Secure with stretch gauze as  directed. 62M Medipore H Soft Cloth Surgical T ape, 4 x 10 (in/yd) Discharge Instruction: Secure with tape as directed. Compression Wrap Compression Stockings Add-Ons Electronic Signature(s) Signed: 01/24/2022 5:06:31 PM By: Deon Pilling RN, BSN Signed: 01/24/2022 5:09:19 PM By: Linton Ham MD Entered By: Linton Ham on 01/24/2022 08:45:14 -------------------------------------------------------------------------------- Multi-Disciplinary Care Plan Details Patient Name: Date of Service: ALMA, MOHIUDDIN 01/24/2022 8:15 A M Medical Record Number: [id number] Patient Account Number: [id number] Date of Birth/Sex: Treating RN: April 19, 1966 (56 y.o. [name], [name] Primary Care Kamarion Zagami: Pricilla Holm Other Clinician: Referring Uva Runkel: Treating Aleida Crandell/Extender: Charlie Pitter in Treatment: 6 Active Inactive Pain, Acute or Chronic Nursing Diagnoses: Pain, acute or chronic: actual or potential Potential alteration in comfort, pain Goals: Patient will verbalize adequate pain control and receive pain control interventions during procedures as needed Date Initiated: 12/13/2021 Target Resolution Date: 02/16/2022 Goal Status: Active Patient/caregiver will verbalize comfort level met Date Initiated: 12/13/2021 Target Resolution Date: 02/16/2022 Goal Status: Active Interventions: Encourage patient to take pain medications as prescribed Provide education on pain management Reposition patient for comfort Treatment Activities: Administer pain control measures as ordered : 12/13/2021 Notes: Wound/Skin Impairment Nursing Diagnoses: Knowledge deficit related to ulceration/compromised skin integrity Goals: Patient/caregiver will verbalize understanding of skin care regimen Date Initiated: 12/13/2021 Target Resolution Date: 02/16/2022 Goal Status: Active Interventions: Assess patient/caregiver ability to obtain necessary supplies Assess patient/caregiver  ability to perform ulcer/skin care regimen upon admission and as needed Provide education on ulcer and skin care Treatment Activities: Skin care regimen initiated : 12/13/2021 Topical wound management initiated : 12/13/2021 Notes: Electronic Signature(s) Signed: 01/24/2022 5:06:31 PM By: Deon Pilling RN, BSN Entered By: Deon Pilling on 01/24/2022 08:36:47 -------------------------------------------------------------------------------- Pain Assessment Details Patient Name: Date of Service: [name] 01/24/2022 8:15 A M Medical Record Number: [id number] Patient Account Number: [id number] Date of Birth/Sex: Treating RN: [birth year] (56 y.o. [name] Primary Care Adylin Hankey: Pricilla Holm Other Clinician: Referring Caydn Justen: Treating Audie Stayer/Extender: Charlie Pitter in Treatment: 6 Active Problems Location of Pain Severity and Description of Pain Patient Has Paino No Site Locations Rate the pain. Current Pain Level: 0 Pain Management and Medication Current Pain Management: Medication: No Cold Application: No Rest: No Massage: No Activity: No T.E.N.S.: No Heat Application: No Leg drop or elevation: No Is the  Current Pain Management Adequate: Adequate How does your wound impact your activities of daily livingo Sleep: No Bathing: No Appetite: No Relationship With Others: No Bladder Continence: No Emotions: No Bowel Continence: No Work: No Toileting: No Drive: No Dressing: No Hobbies: No Engineer, maintenance) Signed: 01/24/2022 5:06:31 PM By: Deon Pilling RN, BSN Entered By: Deon Pilling on 01/24/2022 08:26:04 -------------------------------------------------------------------------------- Patient/Caregiver Education Details Patient Name: Date of Service: [name] 6/7/2023andnbsp8:15 [place] Record Number: [id number] Patient Account Number: [id number] Date of Birth/Gender: Treating RN: 09-20-1965 (56 y.o.  [name] Primary Care Physician: Pricilla Holm Other Clinician: Referring Physician: Treating Physician/Extender: Charlie Pitter in Treatment: 6 Education Assessment Education Provided To: Patient Education Topics Provided Wound/Skin Impairment: Handouts: Skin Care Do's and Dont's Methods: Explain/Verbal Responses: Reinforcements needed Electronic Signature(s) Signed: 01/24/2022 5:06:31 PM By: Deon Pilling RN, BSN Entered By: Deon Pilling on 01/24/2022 08:36:59 -------------------------------------------------------------------------------- Wound Assessment Details Patient Name: Date of Service: [name] 01/24/2022 8:15 A M Medical Record Number: [id number] Patient Account Number: [id number] Date of Birth/Sex: Treating RN: 10/30/1965 (56 y.o. [name], Meta.Reding Primary Care Ragen Laver: Pricilla Holm Other Clinician: Referring Benelli Winther: Treating Mayelin Panos/Extender: Charlie Pitter in Treatment: 6 Wound Status Wound Number: 2 Primary Etiology: Diabetic Wound/Ulcer of the Lower Extremity Wound Location: Right T Great oe Wound Status: Open Wounding Event: Blister Comorbid History: Hypertension, Type II Diabetes, Neuropathy Date Acquired: 11/22/2021 Weeks Of Treatment: 6 Clustered Wound: No Photos Wound Measurements Length: (cm) 1 Width: (cm) 1 Depth: (cm) 0.1 Area: (cm) 0.785 Volume: (cm) 0.079 % Reduction in Area: 78.3% % Reduction in Volume: 78.1% Epithelialization: Medium (34-66%) Tunneling: No Undermining: No Wound Description Classification: Grade 2 Wound Margin: Distinct, outline attached Exudate Amount: Medium Exudate Type: Serosanguineous Exudate Color: red, brown Foul Odor After Cleansing: No Slough/Fibrino No Wound Bed Granulation Amount: Large (67-100%) Exposed Structure Granulation Quality: Red, Pink, Hyper-granulation, Friable Fascia Exposed: No Necrotic Amount: None  Present (0%) Fat Layer (Subcutaneous Tissue) Exposed: Yes Tendon Exposed: No Muscle Exposed: No Joint Exposed: No Bone Exposed: No Treatment Notes Wound #2 (Toe Great) Wound Laterality: Right Cleanser Normal Saline Discharge Instruction: Cleanse the wound with Normal Saline prior to applying a clean dressing using gauze sponges, not tissue or cotton balls. Vashe, 8.5 (oz) Discharge Instruction: use to wash the toe with vashe and gauze. Peri-Wound Care Topical Primary Dressing Hydrofera Blue Ready Foam, 2.5 x2.5 in Discharge Instruction: Apply to wound bed as instructed Secondary Dressing Woven Gauze Sponges 2x2 in Discharge Instruction: Apply over primary dressing as directed. Secured With Conforming Stretch Gauze Bandage, Sterile 2x75 (in/in) Discharge Instruction: Secure with stretch gauze as directed. 37M Medipore H Soft Cloth Surgical T ape, 4 x 10 (in/yd) Discharge Instruction: Secure with tape as directed. Compression Wrap Compression Stockings Add-Ons Electronic Signature(s) Signed: 01/24/2022 5:06:31 PM By: Deon Pilling RN, BSN Entered By: Deon Pilling on 01/24/2022 08:32:19 -------------------------------------------------------------------------------- Vitals Details Patient Name: Date of Service: [name] 01/24/2022 8:15 A M Medical Record Number: [id number] Patient Account Number: [id number] Date of Birth/Sex: Treating RN: 1965/09/01 (56 y.o. [name], [name] Primary Care Eknoor Novack: Pricilla Holm Other Clinician: Referring Edna Grover: Treating Choice Kleinsasser/Extender: Charlie Pitter in Treatment: 6 Vital Signs Time Taken: 08:24 Temperature (F): 97.9 Height (in): 66 Pulse (bpm): 74 Weight (lbs): 220 Respiratory Rate (breaths/min): 20 Body Mass Index (BMI): 35.5 Blood Pressure (mmHg): 118/76 Reference Range: 80 - 120 mg / dl Electronic Signature(s) Signed: 01/24/2022 5:06:31 PM By: Deon Pilling RN, BSN Entered By:  Deon Pilling on 01/24/2022 08:25:52

## 2022-01-26 ENCOUNTER — Other Ambulatory Visit: Payer: Self-pay | Admitting: Internal Medicine

## 2022-01-26 ENCOUNTER — Other Ambulatory Visit (HOSPITAL_COMMUNITY): Payer: Self-pay

## 2022-01-26 MED ORDER — TRESIBA FLEXTOUCH 200 UNIT/ML ~~LOC~~ SOPN
110.0000 [IU] | PEN_INJECTOR | Freq: Every day | SUBCUTANEOUS | 2 refills | Status: DC
  Filled 2022-01-26: qty 15, 27d supply, fill #0
  Filled 2022-03-05: qty 15, 27d supply, fill #1
  Filled 2022-04-10: qty 15, 27d supply, fill #2

## 2022-01-30 NOTE — Progress Notes (Signed)
ANNSLEIGH, DRAGOO ([id number]) Visit Report for 01/03/2022 Arrival Information Details Patient Name: Date of Service: Donna [name], Donna [name] 01/03/2022 3:00 PM Medical Record Number: [id number] Patient Account Number: [id number] Date of Birth/Sex: Treating RN: January 20, 1966 (56 y.o. [name], [name] Primary Care Chai Routh: Pricilla Holm Other Clinician: Referring Jesper Stirewalt: Treating Veria Stradley/Extender: Dreama Saa in Treatment: 3 Visit Information History Since Last Visit Added or deleted any medications: No Patient Arrived: Ambulatory Any new allergies or adverse reactions: No Arrival Time: 14:55 Had a fall or experienced change in No Accompanied By: self activities of daily living that may affect Transfer Assistance: None risk of falls: Patient Identification Verified: Yes Signs or symptoms of abuse/neglect since last visito No Secondary Verification Process Completed: Yes Hospitalized since last visit: No Patient Requires Transmission-Based Precautions: No Implantable device outside of the clinic excluding No Patient Has Alerts: No cellular tissue based products placed in the center since last visit: Has Dressing in Place as Prescribed: Yes Has Footwear/Offloading in Place as Prescribed: Yes Left: Wedge Shoe Pain Present Now: No Electronic Signature(s) Signed: 01/30/2022 8:46:54 AM By: Erenest Blank Entered By: Erenest Blank on 01/03/2022 14:56:19 -------------------------------------------------------------------------------- Encounter Discharge Information Details Patient Name: Date of Service: Donna [name]. 01/03/2022 3:00 PM Medical Record Number: [id number] Patient Account Number: [id number] Date of Birth/Sex: Treating RN: 10/08/1965 (56 y.o. [name], [name] Primary Care Malcomb Gangemi: Pricilla Holm Other Clinician: Referring Dalon Reichart: Treating Nashay Brickley/Extender: Dreama Saa in Treatment: 3 Encounter  Discharge Information Items Post Procedure Vitals Discharge Condition: Stable Temperature (F): 98.6 Ambulatory Status: Ambulatory Pulse (bpm): 76 Discharge Destination: Home Respiratory Rate (breaths/min): 20 Transportation: Private Auto Blood Pressure (mmHg): 125/77 Accompanied By: self Schedule Follow-up Appointment: Yes Clinical Summary of Care: Electronic Signature(s) Signed: 01/03/2022 4:50:23 PM By: Deon Pilling RN, BSN Entered By: Deon Pilling on 01/03/2022 15:24:50 -------------------------------------------------------------------------------- Lower Extremity Assessment Details Patient Name: Date of Service: Donna [name], Donna [name] 01/03/2022 3:00 PM Medical Record Number: [id number] Patient Account Number: [id number] Date of Birth/Sex: Treating RN: [birth year] (56 y.o. [name], [name] Primary Care Sahil Milner: Pricilla Holm Other Clinician: Referring Emilian Stawicki: Treating Carlin Mamone/Extender: Dreama Saa in Treatment: 3 Edema Assessment Assessed: [Left: No] [Right: No] Edema: [Left: N] [Right: o] Calf Left: Right: Point of Measurement: 28 cm From Medial Instep 38 cm Ankle Left: Right: Point of Measurement: 8 cm From Medial Instep 22.8 cm Electronic Signature(s) Signed: 01/03/2022 4:50:23 PM By: Deon Pilling RN, BSN Signed: 01/30/2022 8:46:54 AM By: Erenest Blank Entered By: Erenest Blank on 01/03/2022 15:11:56 -------------------------------------------------------------------------------- Multi-Disciplinary Care Plan Details Patient Name: Date of Service: Donna [name]. 01/03/2022 3:00 PM Medical Record Number: [id number] Patient Account Number: [id number] Date of Birth/Sex: Treating RN: [birth year] (56 y.o. [name], [name] Primary Care Mouhamad Teed: Pricilla Holm Other Clinician: Referring Decorey Wahlert: Treating Dieter Hane/Extender: Dreama Saa in Treatment: 3 Active Inactive Pain, Acute or  Chronic Nursing Diagnoses: Pain, acute or chronic: actual or potential Potential alteration in comfort, pain Goals: Patient will verbalize adequate pain control and receive pain control interventions during procedures as needed Date Initiated: 12/13/2021 Target Resolution Date: 01/19/2022 Goal Status: Active Patient/caregiver will verbalize comfort level met Date Initiated: 12/13/2021 Target Resolution Date: 01/19/2022 Goal Status: Active Interventions: Encourage patient to take pain medications as prescribed Provide education on pain management Reposition patient for comfort Treatment Activities: Administer pain control measures as ordered : 12/13/2021 Notes: Wound/Skin Impairment Nursing Diagnoses: Knowledge deficit related to ulceration/compromised skin integrity Goals: Patient/caregiver will verbalize understanding of skin care  regimen Date Initiated: 12/13/2021 Target Resolution Date: 01/19/2022 Goal Status: Active Interventions: Assess patient/caregiver ability to obtain necessary supplies Assess patient/caregiver ability to perform ulcer/skin care regimen upon admission and as needed Provide education on ulcer and skin care Treatment Activities: Skin care regimen initiated : 12/13/2021 Topical wound management initiated : 12/13/2021 Notes: Electronic Signature(s) Signed: 01/03/2022 4:50:23 PM By: Deon Pilling RN, BSN Entered By: Deon Pilling on 01/03/2022 14:58:19 -------------------------------------------------------------------------------- Pain Assessment Details Patient Name: Date of Service: Donna [name] 01/03/2022 3:00 PM Medical Record Number: [id number] Patient Account Number: [id number] Date of Birth/Sex: Treating RN: 1966/07/22 (56 y.o. [name] Primary Care Fleta Borgeson: Pricilla Holm Other Clinician: Referring Nalu Troublefield: Treating Bralen Wiltgen/Extender: Dreama Saa in Treatment: 3 Active Problems Location of Pain  Severity and Description of Pain Patient Has Paino No Site Locations Pain Management and Medication Current Pain Management: Electronic Signature(s) Signed: 01/03/2022 4:50:23 PM By: Deon Pilling RN, BSN Signed: 01/30/2022 8:46:54 AM By: Erenest Blank Entered By: Erenest Blank on 01/03/2022 14:56:45 -------------------------------------------------------------------------------- Patient/Caregiver Education Details Patient Name: Date of Service: Donna [name] 5/17/2023andnbsp3:00 PM Medical Record Number: [id number] Patient Account Number: [id number] Date of Birth/Gender: Treating RN: 25-Dec-1965 (56 y.o. [name] Primary Care Physician: Pricilla Holm Other Clinician: Referring Physician: Treating Physician/Extender: Dreama Saa in Treatment: 3 Education Assessment Education Provided To: Patient Education Topics Provided Wound/Skin Impairment: Handouts: Skin Care Do's and Dont's Methods: Explain/Verbal Responses: Reinforcements needed Electronic Signature(s) Signed: 01/03/2022 4:50:23 PM By: Deon Pilling RN, BSN Entered By: Deon Pilling on 01/03/2022 14:58:34 -------------------------------------------------------------------------------- Wound Assessment Details Patient Name: Date of Service: Donna [name] 01/03/2022 3:00 PM Medical Record Number: [id number] Patient Account Number: [id number] Date of Birth/Sex: Treating RN: [birth year] (56 y.o. [name], Meta.Reding Primary Care Montey Ebel: Pricilla Holm Other Clinician: Referring Mulki Roesler: Treating Keitha Kolk/Extender: Dreama Saa in Treatment: 3 Wound Status Wound Number: 2 Primary Etiology: Diabetic Wound/Ulcer of the Lower Extremity Wound Location: Right T Great oe Wound Status: Open Wounding Event: Blister Comorbid History: Hypertension, Type II Diabetes, Neuropathy Date Acquired: 11/22/2021 Weeks Of Treatment: 3 Clustered Wound:  No Photos Wound Measurements Length: (cm) 1.2 Width: (cm) 0.9 Depth: (cm) 0.1 Area: (cm) 0.848 Volume: (cm) 0.085 % Reduction in Area: 76.5% % Reduction in Volume: 76.5% Epithelialization: Small (1-33%) Tunneling: No Undermining: No Wound Description Classification: Grade 2 Wound Margin: Distinct, outline attached Exudate Amount: Medium Exudate Type: Serosanguineous Exudate Color: red, brown Foul Odor After Cleansing: No Slough/Fibrino Yes Wound Bed Granulation Amount: Large (67-100%) Exposed Structure Granulation Quality: Red, Pink, Hyper-granulation Fascia Exposed: No Necrotic Amount: None Present (0%) Fat Layer (Subcutaneous Tissue) Exposed: Yes Tendon Exposed: No Muscle Exposed: No Joint Exposed: No Bone Exposed: No Electronic Signature(s) Signed: 01/03/2022 4:50:23 PM By: Deon Pilling RN, BSN Signed: 01/30/2022 8:46:54 AM By: Erenest Blank Entered By: Erenest Blank on 01/03/2022 15:10:33 -------------------------------------------------------------------------------- Vitals Details Patient Name: Date of Service: Donna [name]. 01/03/2022 3:00 PM Medical Record Number: [id number] Patient Account Number: [id number] Date of Birth/Sex: Treating RN: 26-Oct-1965 (56 y.o. [name], [name] Primary Care Linde Wilensky: Pricilla Holm Other Clinician: Referring Mella Inclan: Treating Blayne Frankie/Extender: Dreama Saa in Treatment: 3 Vital Signs Time Taken: 14:56 Temperature (F): 98.2 Height (in): 66 Pulse (bpm): 76 Weight (lbs): 220 Respiratory Rate (breaths/min): 18 Body Mass Index (BMI): 35.5 Blood Pressure (mmHg): 125/77 Reference Range: 80 - 120 mg / dl Electronic Signature(s) Signed: 01/30/2022 8:46:54 AM By: Erenest Blank Entered By: Erenest Blank on 01/03/2022 14:56:39

## 2022-01-30 NOTE — Progress Notes (Signed)
SENAI, RAMNATH ([id number]) Visit Report for 01/10/2022 Arrival Information Details Patient Name: Date of Service: Donna [name], Donna [name] 01/10/2022 8:15 A M Medical Record Number: [id number] Patient Account Number: [id number] Date of Birth/Sex: Treating RN: 06/05/1966 (56 y.o. [name], [name] Primary Care Ferdinand Revoir: Pricilla Holm Other Clinician: Referring Arriyanna Mersch: Treating Kaleyah Labreck/Extender: Dreama Saa in Treatment: 4 Visit Information History Since Last Visit Added or deleted any medications: No Patient Arrived: Ambulatory Any new allergies or adverse reactions: No Arrival Time: 08:26 Had a fall or experienced change in No Accompanied By: self activities of daily living that may affect Transfer Assistance: None risk of falls: Patient Identification Verified: Yes Signs or symptoms of abuse/neglect since last visito No Secondary Verification Process Completed: Yes Hospitalized since last visit: No Patient Requires Transmission-Based Precautions: No Implantable device outside of the clinic excluding No Patient Has Alerts: No cellular tissue based products placed in the center since last visit: Has Footwear/Offloading in Place as Prescribed: Yes Right: Wedge Shoe Pain Present Now: No Electronic Signature(s) Signed: 01/10/2022 2:56:41 PM By: Deon Pilling RN, BSN Entered By: Deon Pilling on 01/10/2022 08:26:48 -------------------------------------------------------------------------------- Encounter Discharge Information Details Patient Name: Date of Service: Donna [name] 01/10/2022 8:15 A M Medical Record Number: [id number] Patient Account Number: [id number] Date of Birth/Sex: Treating RN: 03-07-1966 (56 y.o. [name], [name] Primary Care Karsynn Deweese: Pricilla Holm Other Clinician: Referring Demarrio Menges: Treating Ireland Chagnon/Extender: Dreama Saa in Treatment: 4 Encounter Discharge Information Items Post  Procedure Vitals Discharge Condition: Stable Temperature (F): 98 Ambulatory Status: Ambulatory Pulse (bpm): 71 Discharge Destination: Home Respiratory Rate (breaths/min): 20 Transportation: Private Auto Blood Pressure (mmHg): 124/74 Accompanied By: self Schedule Follow-up Appointment: Yes Clinical Summary of Care: Electronic Signature(s) Signed: 01/30/2022 8:46:54 AM By: Erenest Blank Entered By: Erenest Blank on 01/10/2022 09:17:19 -------------------------------------------------------------------------------- Lower Extremity Assessment Details Patient Name: Date of Service: Donna [name], Donna [name] 01/10/2022 8:15 A M Medical Record Number: [id number] Patient Account Number: [id number] Date of Birth/Sex: Treating RN: 02-20-1966 (56 y.o. [name] Primary Care Stillman Buenger: Pricilla Holm Other Clinician: Referring Alesia Oshields: Treating Kennethia Lynes/Extender: Dreama Saa in Treatment: 4 Edema Assessment Assessed: [Left: No] [Right: Yes] Edema: [Left: N] [Right: o] Calf Left: Right: Point of Measurement: 28 cm From Medial Instep 38 cm Ankle Left: Right: Point of Measurement: 8 cm From Medial Instep 23 cm Vascular Assessment Pulses: Dorsalis Pedis Palpable: [Right:Yes] Electronic Signature(s) Signed: 01/10/2022 2:56:41 PM By: Deon Pilling RN, BSN Entered By: Deon Pilling on 01/10/2022 08:28:29 -------------------------------------------------------------------------------- [place] Details Patient Name: Date of Service: Donna [name] 01/10/2022 8:15 A M Medical Record Number: [id number] Patient Account Number: [id number] Date of Birth/Sex: Treating RN: [birth year] (56 y.o. [name], [name] Primary Care Quetzal Meany: Pricilla Holm Other Clinician: Referring Virgilio Broadhead: Treating Kelvyn Schunk/Extender: Dreama Saa in Treatment: 4 Active Inactive Pain, Acute or Chronic Nursing  Diagnoses: Pain, acute or chronic: actual or potential Potential alteration in comfort, pain Goals: Patient will verbalize adequate pain control and receive pain control interventions during procedures as needed Date Initiated: 12/13/2021 Target Resolution Date: 02/16/2022 Goal Status: Active Patient/caregiver will verbalize comfort level met Date Initiated: 12/13/2021 Target Resolution Date: 02/16/2022 Goal Status: Active Interventions: Encourage patient to take pain medications as prescribed Provide education on pain management Reposition patient for comfort Treatment Activities: Administer pain control measures as ordered : 12/13/2021 Notes: Wound/Skin Impairment Nursing Diagnoses: Knowledge deficit related to ulceration/compromised skin integrity Goals: Patient/caregiver will verbalize understanding of skin care regimen Date Initiated:  12/13/2021 Target Resolution Date: 02/16/2022 Goal Status: Active Interventions: Assess patient/caregiver ability to obtain necessary supplies Assess patient/caregiver ability to perform ulcer/skin care regimen upon admission and as needed Provide education on ulcer and skin care Treatment Activities: Skin care regimen initiated : 12/13/2021 Topical wound management initiated : 12/13/2021 Notes: Electronic Signature(s) Signed: 01/10/2022 2:56:41 PM By: Deon Pilling RN, BSN Entered By: Deon Pilling on 01/10/2022 08:33:51 -------------------------------------------------------------------------------- Pain Assessment Details Patient Name: Date of Service: Donna [name] 01/10/2022 8:15 A M Medical Record Number: [id number] Patient Account Number: [id number] Date of Birth/Sex: Treating RN: 08-04-1966 (56 y.o. [name] Primary Care Yusra Ravert: Pricilla Holm Other Clinician: Referring Jibreel Fedewa: Treating Ladaja Yusupov/Extender: Dreama Saa in Treatment: 4 Active Problems Location of Pain Severity and  Description of Pain Patient Has Paino No Site Locations Rate the pain. Current Pain Level: 0 Pain Management and Medication Current Pain Management: Medication: No Cold Application: No Rest: No Massage: No Activity: No T.E.N.S.: No Heat Application: No Leg drop or elevation: No Is the Current Pain Management Adequate: Adequate How does your wound impact your activities of daily livingo Sleep: No Bathing: No Appetite: No Relationship With Others: No Bladder Continence: No Emotions: No Bowel Continence: No Work: No Toileting: No Drive: No Dressing: No Hobbies: No Engineer, maintenance) Signed: 01/10/2022 2:56:41 PM By: Deon Pilling RN, BSN Entered By: Deon Pilling on 01/10/2022 08:28:21 -------------------------------------------------------------------------------- Patient/Caregiver Education Details Patient Name: Date of Service: Donna [name] 5/24/2023andnbsp8:15 A M Medical Record Number: [id number] Patient Account Number: [id number] Date of Birth/Gender: Treating RN: [birth year] (56 y.o. [name] Primary Care Physician: Pricilla Holm Other Clinician: Referring Physician: Treating Physician/Extender: Dreama Saa in Treatment: 4 Education Assessment Education Provided To: Patient Education Topics Provided Wound/Skin Impairment: Handouts: Skin Care Do's and Dont's Methods: Explain/Verbal Responses: Reinforcements needed Electronic Signature(s) Signed: 01/10/2022 2:56:41 PM By: Deon Pilling RN, BSN Entered By: Deon Pilling on 01/10/2022 08:34:06 -------------------------------------------------------------------------------- Wound Assessment Details Patient Name: Date of Service: Donna [name] 01/10/2022 8:15 A M Medical Record Number: [id number] Patient Account Number: [id number] Date of Birth/Sex: Treating RN: 12-10-1965 (56 y.o. [name], Meta.Reding Primary Care Sohail Capraro: Pricilla Holm Other  Clinician: Referring [place] Straus: Treating Georgian Mcclory/Extender: Dreama Saa in Treatment: 4 Wound Status Wound Number: 2 Primary Etiology: Diabetic Wound/Ulcer of the Lower Extremity Wound Location: Right T Great oe Wound Status: Open Wounding Event: Blister Comorbid History: Hypertension, Type II Diabetes, Neuropathy Date Acquired: 11/22/2021 Weeks Of Treatment: 4 Clustered Wound: No Photos Wound Measurements Length: (cm) 1.1 Width: (cm) 1 Depth: (cm) 0.1 Area: (cm) 0.864 Volume: (cm) 0.086 % Reduction in Area: 76.1% % Reduction in Volume: 76.2% Epithelialization: Medium (34-66%) Tunneling: No Undermining: No Wound Description Classification: Grade 2 Wound Margin: Distinct, outline attached Exudate Amount: Medium Exudate Type: Serosanguineous Exudate Color: red, brown Foul Odor After Cleansing: No Slough/Fibrino No Wound Bed Granulation Amount: Large (67-100%) Exposed Structure Granulation Quality: Red, Pink Fascia Exposed: No Necrotic Amount: None Present (0%) Fat Layer (Subcutaneous Tissue) Exposed: Yes Tendon Exposed: No Muscle Exposed: No Joint Exposed: No Bone Exposed: No Assessment Notes callous periwound. Electronic Signature(s) Signed: 01/10/2022 2:56:41 PM By: Deon Pilling RN, BSN Entered By: Deon Pilling on 01/10/2022 08:33:29 -------------------------------------------------------------------------------- Vitals Details Patient Name: Date of Service: Donna [name] 01/10/2022 8:15 A M Medical Record Number: [id number] Patient Account Number: [id number] Date of Birth/Sex: Treating RN: 1965-11-04 (56 y.o. [name] Primary Care Theoden Mauch: Pricilla Holm Other Clinician: Referring Nabor Thomann: Treating Tylee Newby/Extender: Melburn Hake,  Alla German, Benjamine Mola Weeks in Treatment: 4 Vital Signs Time Taken: 08:25 Temperature (F): 98 Height (in): 66 Pulse (bpm): 71 Weight (lbs): 220 Respiratory Rate  (breaths/min): 20 Body Mass Index (BMI): 35.5 Blood Pressure (mmHg): 124/74 Reference Range: 80 - 120 mg / dl Electronic Signature(s) Signed: 01/10/2022 2:56:41 PM By: Deon Pilling RN, BSN Entered By: Deon Pilling on 01/10/2022 08:28:13

## 2022-01-31 ENCOUNTER — Encounter (HOSPITAL_BASED_OUTPATIENT_CLINIC_OR_DEPARTMENT_OTHER): Payer: 59 | Admitting: Physician Assistant

## 2022-01-31 DIAGNOSIS — Z9889 Other specified postprocedural states: Secondary | ICD-10-CM | POA: Diagnosis not present

## 2022-01-31 DIAGNOSIS — E11621 Type 2 diabetes mellitus with foot ulcer: Secondary | ICD-10-CM | POA: Diagnosis not present

## 2022-01-31 DIAGNOSIS — Z872 Personal history of diseases of the skin and subcutaneous tissue: Secondary | ICD-10-CM | POA: Diagnosis not present

## 2022-01-31 DIAGNOSIS — E1142 Type 2 diabetes mellitus with diabetic polyneuropathy: Secondary | ICD-10-CM | POA: Diagnosis not present

## 2022-01-31 DIAGNOSIS — L97512 Non-pressure chronic ulcer of other part of right foot with fat layer exposed: Secondary | ICD-10-CM | POA: Diagnosis not present

## 2022-01-31 DIAGNOSIS — L97812 Non-pressure chronic ulcer of other part of right lower leg with fat layer exposed: Secondary | ICD-10-CM | POA: Diagnosis not present

## 2022-01-31 DIAGNOSIS — I10 Essential (primary) hypertension: Secondary | ICD-10-CM | POA: Diagnosis not present

## 2022-01-31 NOTE — Progress Notes (Signed)
ZENDA, HERSKOWITZ ([id number]) Visit Report for 01/31/2022 Arrival Information Details Patient Name: Date of Service: Donna [name], Donna [name] 01/31/2022 12:30 PM Medical Record Number: [id number] Patient Account Number: [id number] Date of Birth/Sex: Treating RN: 01-15-1966 (56 y.o. Donna [name], [name] Primary Care Briah Nary: Pricilla Holm Other Clinician: Referring Mekala Winger: Treating Maaz Spiering/Extender: Dreama Saa in Treatment: 7 Visit Information History Since Last Visit Added or deleted any medications: No Patient Arrived: Ambulatory Any new allergies or adverse reactions: No Arrival Time: 12:39 Had a fall or experienced change in No Accompanied By: self activities of daily living that may affect Transfer Assistance: None risk of falls: Patient Identification Verified: Yes Signs or symptoms of abuse/neglect since last visito No Secondary Verification Process Completed: Yes Hospitalized since last visit: No Patient Requires Transmission-Based Precautions: No Implantable device outside of the clinic excluding No Patient Has Alerts: No cellular tissue based products placed in the center since last visit: Has Dressing in Place as Prescribed: Yes Has Footwear/Offloading in Place as Prescribed: Yes Right: Wedge [name] Pain Present Now: No Electronic Signature(s) Signed: 01/31/2022 5:24:59 PM By: Deon Pilling RN, BSN Entered By: Deon Pilling on 01/31/2022 12:40:20 -------------------------------------------------------------------------------- Encounter Discharge Information Details Patient Name: Date of Service: Donna [name]. 01/31/2022 12:30 PM Medical Record Number: [id number] Patient Account Number: [id number] Date of Birth/Sex: Treating RN: 04/10/1966 (56 y.o. Donna [name] Primary Care Dewayne Jurek: Pricilla Holm Other Clinician: Referring Dona Walby: Treating Haralambos Yeatts/Extender: Dreama Saa in Treatment:  7 Encounter Discharge Information Items Post Procedure Vitals Discharge Condition: Stable Temperature (F): 98.5 Ambulatory Status: Ambulatory Pulse (bpm): 74 Discharge Destination: Home Respiratory Rate (breaths/min): 20 Transportation: Private Auto Blood Pressure (mmHg): 107/71 Accompanied By: self Schedule Follow-up Appointment: Yes Clinical Summary of Care: Notes foot defender boot to use. Electronic Signature(s) Signed: 01/31/2022 5:24:59 PM By: Deon Pilling RN, BSN Entered By: Deon Pilling on 01/31/2022 13:10:29 -------------------------------------------------------------------------------- Lower Extremity Assessment Details Patient Name: Date of Service: ABRYANNA, MUSOLINO 01/31/2022 12:30 PM Medical Record Number: [id number] Patient Account Number: [id number] Date of Birth/Sex: Treating RN: January 28, 1966 (56 y.o. Donna [name] Primary Care Suzetta Timko: Pricilla Holm Other Clinician: Referring Yasser Hepp: Treating Stephanny Tsutsui/Extender: Dreama Saa in Treatment: 7 Edema Assessment Assessed: [Left: No] [Right: Yes] Edema: [Left: N] [Right: o] Calf Left: Right: Point of Measurement: 28 cm From Medial Instep 37 cm Ankle Left: Right: Point of Measurement: 8 cm From Medial Instep 22 cm Vascular Assessment Pulses: Dorsalis Pedis Palpable: [Right:Yes] Electronic Signature(s) Signed: 01/31/2022 5:24:59 PM By: Deon Pilling RN, BSN Entered By: Deon Pilling on 01/31/2022 12:42:12 -------------------------------------------------------------------------------- [place] Details Patient Name: Date of Service: Donna [name]. 01/31/2022 12:30 PM Medical Record Number: [id number] Patient Account Number: [id number] Date of Birth/Sex: Treating RN: 08-18-1966 (56 y.o. Donna [name], [name] Primary Care Matyas Baisley: Pricilla Holm Other Clinician: Referring Cherell Colvin: Treating Kanika Bungert/Extender: Dreama Saa in Treatment: 7 Active Inactive Pain, Acute or Chronic Nursing Diagnoses: Pain, acute or chronic: actual or potential Potential alteration in comfort, pain Goals: Patient will verbalize adequate pain control and receive pain control interventions during procedures as needed Date Initiated: 12/13/2021 Target Resolution Date: 02/16/2022 Goal Status: Active Patient/caregiver will verbalize comfort level met Date Initiated: 12/13/2021 Target Resolution Date: 02/16/2022 Goal Status: Active Interventions: Encourage patient to take pain medications as prescribed Provide education on pain management Reposition patient for comfort Treatment Activities: Administer pain control measures as ordered : 12/13/2021 Notes: Wound/Skin Impairment Nursing Diagnoses: Knowledge deficit related to ulceration/compromised skin integrity  Goals: Patient/caregiver will verbalize understanding of skin care regimen Date Initiated: 12/13/2021 Target Resolution Date: 02/16/2022 Goal Status: Active Interventions: Assess patient/caregiver ability to obtain necessary supplies Assess patient/caregiver ability to perform ulcer/skin care regimen upon admission and as needed Provide education on ulcer and skin care Treatment Activities: Skin care regimen initiated : 12/13/2021 Topical wound management initiated : 12/13/2021 Notes: Electronic Signature(s) Signed: 01/31/2022 5:24:59 PM By: Deon Pilling RN, BSN Entered By: Deon Pilling on 01/31/2022 12:57:46 -------------------------------------------------------------------------------- Pain Assessment Details Patient Name: Date of Service: Donna [name] 01/31/2022 12:30 PM Medical Record Number: [id number] Patient Account Number: [id number] Date of Birth/Sex: Treating RN: 07-03-1966 (56 y.o. Donna [name] Primary Care Ardie Dragoo: Pricilla Holm Other Clinician: Referring Lysette Lindenbaum: Treating Zamia Tyminski/Extender: Dreama Saa in Treatment: 7 Active Problems Location of Pain Severity and Description of Pain Patient Has Paino No Site Locations Rate the pain. Current Pain Level: 0 Pain Management and Medication Current Pain Management: Medication: No Cold Application: No Rest: No Massage: No Activity: No T.E.N.S.: No Heat Application: No Leg drop or elevation: No Is the Current Pain Management Adequate: Adequate How does your wound impact your activities of daily livingo Sleep: No Bathing: No Appetite: No Relationship With Others: No Bladder Continence: No Emotions: No Bowel Continence: No Work: No Toileting: No Drive: No Dressing: No Hobbies: No Engineer, maintenance) Signed: 01/31/2022 5:24:59 PM By: Deon Pilling RN, BSN Entered By: Deon Pilling on 01/31/2022 12:40:46 -------------------------------------------------------------------------------- Patient/Caregiver Education Details Patient Name: Date of Service: Donna [name] 6/14/2023andnbsp12:30 PM Medical Record Number: [id number] Patient Account Number: [id number] Date of Birth/Gender: Treating RN: 1966/01/03 (56 y.o. Donna [name] Primary Care Physician: Pricilla Holm Other Clinician: Referring Physician: Treating Physician/Extender: Dreama Saa in Treatment: 7 Education Assessment Education Provided To: Patient Education Topics Provided Wound/Skin Impairment: Handouts: Skin Care Do's and Dont's Methods: Explain/Verbal Responses: Reinforcements needed Electronic Signature(s) Signed: 01/31/2022 5:24:59 PM By: Deon Pilling RN, BSN Entered By: Deon Pilling on 01/31/2022 12:58:06 -------------------------------------------------------------------------------- Wound Assessment Details Patient Name: Date of Service: Donna [name] 01/31/2022 12:30 PM Medical Record Number: [id number] Patient Account Number: [id number] Date of Birth/Sex: Treating RN: 1965/11/07  (56 y.o. Donna [name], Meta.Reding Primary Care Emberley Kral: Pricilla Holm Other Clinician: Referring Wren Pryce: Treating Zaryiah Barz/Extender: Dreama Saa in Treatment: 7 Wound Status Wound Number: 2 Primary Etiology: Diabetic Wound/Ulcer of the Lower Extremity Wound Location: Right T Great oe Wound Status: Open Wounding Event: Blister Comorbid History: Hypertension, Type II Diabetes, Neuropathy Date Acquired: 11/22/2021 Weeks Of Treatment: 7 Clustered Wound: No Photos Wound Measurements Length: (cm) 0.9 Width: (cm) 0.9 Depth: (cm) 0.1 Area: (cm) 0.636 Volume: (cm) 0.064 % Reduction in Area: 82.4% % Reduction in Volume: 82.3% Epithelialization: Medium (34-66%) Tunneling: No Undermining: No Wound Description Classification: Grade 2 Wound Margin: Distinct, outline attached Exudate Amount: Medium Exudate Type: Serosanguineous Exudate Color: red, brown Foul Odor After Cleansing: No Slough/Fibrino Yes Wound Bed Granulation Amount: Large (67-100%) Exposed Structure Granulation Quality: Red, Pink, Hyper-granulation Fascia Exposed: No Necrotic Amount: Small (1-33%) Fat Layer (Subcutaneous Tissue) Exposed: Yes Necrotic Quality: Adherent Slough Tendon Exposed: No Muscle Exposed: No Joint Exposed: No Bone Exposed: No Treatment Notes Wound #2 (Toe Great) Wound Laterality: Right Cleanser Normal Saline Discharge Instruction: Cleanse the wound with Normal Saline prior to applying a clean dressing using gauze sponges, not tissue or cotton balls. Vashe, 8.5 (oz) Discharge Instruction: use to wash the toe with vashe and gauze. Peri-Wound Care Topical Primary Dressing Hydrofera Blue Ready Foam,  2.5 x2.5 in Discharge Instruction: Apply to wound bed as instructed Secondary Dressing Woven Gauze Sponge, Non-Sterile 4x4 in Discharge Instruction: Apply rolled gauze under the toe to aid in keeping the toe from bending causing friction/shear. Woven Gauze  Sponges 2x2 in Discharge Instruction: Apply over primary dressing as directed. Secured With Conforming Stretch Gauze Bandage, Sterile 2x75 (in/in) Discharge Instruction: Secure with stretch gauze as directed. 43M Medipore H Soft Cloth Surgical Tape, 4 x 10 (in/yd) Discharge Instruction: Secure with tape as directed. Compression Wrap Compression Stockings Add-Ons Electronic Signature(s) Signed: 01/31/2022 5:24:59 PM By: Deon Pilling RN, BSN Entered By: Deon Pilling on 01/31/2022 12:46:19 -------------------------------------------------------------------------------- Vitals Details Patient Name: Date of Service: Donna [name]. 01/31/2022 12:30 PM Medical Record Number: [id number] Patient Account Number: [id number] Date of Birth/Sex: Treating RN: 1965-10-03 (56 y.o. Donna [name], [name] Primary Care Maly Lemarr: Pricilla Holm Other Clinician: Referring Carlester Kasparek: Treating Amandine Covino/Extender: Dreama Saa in Treatment: 7 Vital Signs Time Taken: 12:39 Temperature (F): 98.5 Height (in): 66 Pulse (bpm): 74 Weight (lbs): 220 Respiratory Rate (breaths/min): 20 Body Mass Index (BMI): 35.5 Blood Pressure (mmHg): 107/71 Reference Range: 80 - 120 mg / dl Electronic Signature(s) Signed: 01/31/2022 5:24:59 PM By: Deon Pilling RN, BSN Entered By: Deon Pilling on 01/31/2022 12:40:38

## 2022-01-31 NOTE — Progress Notes (Addendum)
Tonny BollmanCURRIN, Willadean R. ([id number]) Visit Report for 01/31/2022 Chief Complaint Document Details Patient Name: Date of Service: Tonny BollmanCURRIN, Makylah R. 01/31/2022 12:30 PM Medical Record Number: [id number] Patient Account Number: [id number] Date of Birth/Sex: Treating RN: 05/03/1966 (56 y.o. Arta SilenceF) Deaton, Bobbi Primary Care Provider: Hillard Dankerrawford, Elizabeth Other Clinician: Referring Provider: Treating Provider/Extender: Jolee EwingStone III, Gazelle Towe Crawford, Elizabeth Weeks in Treatment: 7 Information Obtained from: Patient Chief Complaint Right great toe ulcer Electronic Signature(s) Signed: 01/31/2022 12:51:55 PM By: Lenda KelpStone III, Rui Wordell PA-C Entered By: Lenda KelpStone III, Davi Kroon on 01/31/2022 12:51:55 -------------------------------------------------------------------------------- Debridement Details Patient Name: Date of Service: Tonny BollmanCURRIN, Aerilynn R. 01/31/2022 12:30 PM Medical Record Number: [id number] Patient Account Number: [id number] Date of Birth/Sex: Treating RN: 09/19/1965 (56 y.o. Debara PickettF) Deaton, Millard.LoaBobbi Primary Care Provider: Hillard Dankerrawford, Elizabeth Other Clinician: Referring Provider: Treating Provider/Extender: Jolee EwingStone III, Graves Nipp Crawford, Elizabeth Weeks in Treatment: 7 Debridement Performed for Assessment: Wound #2 Right T Great oe Performed By: Physician Lenda KelpStone III, Kamari Bilek, PA Debridement Type: Debridement Severity of Tissue Pre Debridement: Fat layer exposed Level of Consciousness (Pre-procedure): Awake and Alert Pre-procedure Verification/Time Out Yes - 12:55 Taken: Start Time: 12:56 Pain Control: Lidocaine 5% topical ointment T Area Debrided (L x W): otal 2 (cm) x 2 (cm) = 4 (cm) Tissue and other material debrided: Viable, Non-Viable, Callus, Slough, Subcutaneous, Skin: Dermis , Skin: Epidermis, Slough Level: Skin/Subcutaneous Tissue Debridement Description: Excisional Instrument: Curette Bleeding: Minimum Hemostasis Achieved: Pressure End Time: 13:01 Procedural Pain: 0 Post Procedural Pain: 0 Response to  Treatment: Procedure was tolerated well Level of Consciousness (Post- Awake and Alert procedure): Post Debridement Measurements of Total Wound Length: (cm) 0.9 Width: (cm) 0.9 Depth: (cm) 0.1 Volume: (cm) 0.064 Character of Wound/Ulcer Post Debridement: Improved Severity of Tissue Post Debridement: Fat layer exposed Post Procedure Diagnosis Same as Pre-procedure Electronic Signature(s) Signed: 01/31/2022 5:11:12 PM By: Lenda KelpStone III, Alfonso Carden PA-C Signed: 01/31/2022 5:24:59 PM By: Shawn Stalleaton, Bobbi RN, BSN Entered By: Shawn Stalleaton, Bobbi on 01/31/2022 13:00:35 -------------------------------------------------------------------------------- HPI Details Patient Name: Date of Service: Tonny BollmanURRIN, Laurynn R. 01/31/2022 12:30 PM Medical Record Number: [id number] Patient Account Number: [id number] Date of Birth/Sex: Treating RN: 05/11/1966 (56 y.o. 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 She tells me that since at least last summer she has had a progressive callused area that has opened closed but is never really healed. She has a history of calluses on her feet. She is a diabetic with neuropathy. She was referred to Dr. Allena KatzPatel at [facility] who felt that this might be a large plantar wart she was treated with laser for 2 treatments and then subsequently with ammonium lactate none of this is apparently made any difference according to the patient. She has a very odd looking area with thick skin and callus and an almost crossed like area of open tissue. She also has erythema above this which she says is chronic. The other interesting part of her recent history is recurrent cellulitis on the right lower leg although  this is separated from the area we are looking at. She has had trips to see infectious disease has been on Keflex for as long as 20 days. This is presumed to be strep. She saw Dr. Renold DonVu Past medical history includes type 2 diabetes, recurrent cellulitis of the right leg, right Achilles tendon repair a year  ago for spontaneous rupture ABI in our clinic was 1.09 on the right 2/17; patient admitted the clinic last week had a thick callused area on the right lateral heel she is a diabetic with neuropathy. She had thick callus and across like area of open wound. I aggressively debrided this area to remove most of the callus and gave her Hydrofera Blue and heel off loader, heel cup and she has been wearing this religiously trying to keep the pressure off the area. She comes in today with a cross-like area of injury has healed she has a new open area that I do not remember from last time. Still some dry flaking skin and callus but not nearly as much as last week at which time I had to use a scalpel to remove this. 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 She came in 2 weeks ago with a very thick hyperkeratotic callused area on her right lateral heel. There was a "crossed" shaped area of split tissue with an open wound at the bottom. This had previously been treated as a possible wart without improvement. I removed all of the callus when she first came in here. This is mostly epithelialized over still thick skin but nothing like it was. She has 1 small open area that has not healed she has been more religious about offloading the area 10/27/2020 upon evaluation today patient appears to be doing well with regard to her wound although she does still apparently appear to have something still open in the base of the wound. Fortunately there is no evidence of infection at this time which is great news and in general I feel like she is doing decently well though she has a lot of callus  buildup she is using the offloading shoe for her heel. At home she does not always wear this she tells me. Generally she is using flip-flops there. Of note her primary care provider did give her Keflex yesterday for cellulitis of this leg but has nothing to do with the heel location. 3/17; the patient's area actually looks a lot better. There is no open wound here. Much less callus. She was given a prescription for 40% urea cream I am not sure that really is required this week. She is going to require less friction on this area. I think she is going to need a different type of shoe going forward Readmission: 12-13-2028 upon evaluation today patient presents for reevaluation in the clinic she was last seen by Dr. Leanord Hawking on 11-03-2020 when she was discharged this was due to a crack on her heel. Currently she is actually having an issue with her great toe on the right foot. She tells me that this has been present currently for a couple of weeks since around November 22, 2021. She has had a lot of callus over this area in general but not an open wound until that time. Subsequently this has more recently opened and has Become a greater concern she is also noted some odor which is disconcerting to her. She tells me that her greatest concern is that "she does not lose this toe". Again I completely understand her concern in this regard I definitely think we need to be aggressive in treating this and try to get things better. She is very Adult nurse. Patient does have a history of diabetes mellitus type 2 with diabetic neuropathy. She also tells me that she does have a history as well of hypertension which is  pretty well controlled. She has not yet had an x-ray of the area and has not been on any antibiotics. Both are things I think we will need to do today. 12-20-2021 upon evaluation today patient appears to be doing well with regard to her toe ulcer all things considered. She has been tolerating the dressing  changes without complication. I do have her x-ray as well as her culture for review today the culture did so Staphylococcus aureus no MRSA noted which was great news susceptible to doxycycline which is what I did place her on last week. Overall the odor that she was noticing has also cleared which is great news. With regard to the x-ray there does not appear to be any signs of osteomyelitis or bone erosion at this point which is also great news in general and very pleased with where things stand. 12-27-2021 upon evaluation today patient appears to be doing well with regard to her wound this is actually measuring significantly better which is great news. Overall I am extremely pleased with where we stand today. No fevers, chills, nausea, vomiting, or diarrhea. 01-03-2022 upon evaluation today patient's wound is actually showing signs of significant improvement which is also news. I am very pleased with what we are seeing there is a little bit of callus buildup still but we are going to work on that today otherwise she seems to be making excellent progress which is great news. 01-10-2022 upon evaluation today patient appears to be doing well currently in regard to her toe ulcer. She has been tolerating the dressing changes without complication. Fortunately there does not appear to be any evidence of active infection locally or systemically which is great news. No fevers, chills, nausea, vomiting, or diarrhea. 01-17-2022 upon evaluation today patient's toe was actually doing about the same this week there is not a tremendous improvement in size but overall the appearance of the wound is better it is much flatter I do think the [facility] has done well in that regard. We will still wait to hear back on the skin substitute. With that being said I do believe that the patient could benefit from again the skin substitute to try to help get this to heal much more effectively and quickly in the meantime I am  trying to help flatten this out which I think is doing a pretty good job.. 6/7; wound on the plantar right great toe over the distal phalanx. Wound is slightly smaller but looks healthier there is rims of epithelialization she still has some callus on the toe indicative of inadequate pressure/friction relief. I talked to her about making sure in the forefoot offloading boot that the weight stays on her heel in stance phase 01-31-2022 upon evaluation patient's wound is still continuing to show signs of really minimal progress unfortunately. I actually believe that she may need to go into the total contact cast in order to get this to heal and I discussed that with her today as well. Obviously this has been a little bit of a barrier for her not because she does not want to do it and does not want to get healed but rather because it is going to prevent her from being able to drive which is obviously going to be a significant quality-of-life issue with what she does in the position she is in at work. 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 I do believe she  could work with the cast on but the issue simply is that she cannot drive to get to work. Otherwise should be able to walk and do what she needs to do. Electronic Signature(s) Signed: 01/31/2022 1:39:54 PM By: Lenda Kelp PA-C Entered By: Lenda Kelp on 01/31/2022 13:39:53 -------------------------------------------------------------------------------- Physical Exam Details Patient Name: Date of Service: LATOIA, EYSTER 01/31/2022 12:30 PM Medical Record Number: [id number] Patient Account Number: [id number] Date of Birth/Sex: Treating RN: 05/01/1966 (56 y.o. [name] Primary Care Provider: Hillard Danker Other Clinician: Referring Provider: Treating Provider/Extender: [name] in Treatment: 7 Constitutional Well-nourished and  well-hydrated in no acute distress. Respiratory normal breathing without difficulty. Psychiatric this patient is able to make decisions and demonstrates good insight into disease process. Alert and Oriented x 3. pleasant and cooperative. Notes Upon inspection patient's wound bed actually showed signs of good granulation epithelization at this point. Fortunately I do not see any evidence of active infection locally or systemically which is great news and overall extremely pleased with where things stand today. With that being said I do believe that the wound is roughly about the same size as what I saw 2 weeks ago and that means were not making a tremendous improvement here. I am not saying we cannot get it healed this way but I think is going to take a much longer amount of time to do so. Electronic Signature(s) Signed: 01/31/2022 1:40:29 PM By: Lenda Kelp PA-C Entered By: Lenda Kelp on 01/31/2022 13:40:28 -------------------------------------------------------------------------------- Physician Orders Details Patient Name: Date of Service: VELDA, WENDT 01/31/2022 12:30 PM Medical Record Number: [id number] Patient Account Number: [id number] Date of Birth/Sex: Treating RN: [birth year] (56 y.o. [name], Millard.Loa Primary Care Provider: Hillard Danker Other Clinician: Referring Provider: Treating Provider/Extender: [name] in Treatment: 7 Verbal / Phone Orders: No Diagnosis Coding ICD-10 Coding Code Description E11.621 Type 2 diabetes mellitus with foot ulcer L97.812 Non-pressure chronic ulcer of other part of right lower leg with fat layer exposed E11.42 Type 2 diabetes mellitus with diabetic polyneuropathy I10 Essential (primary) hypertension Follow-up Appointments ppointment in 1 week. Allen D[place], [place] and [place], Room 8 Wednesday 02/07/2022 0815 Return A Allen Derry, [place] and [place], Room 8 Wednesday 02/14/2022 0815 Cellular or Tissue  Based Products Wound #2 Right T Great oe Cellular or Tissue Based Product Type: - 01/10/2022 Epifix run insurance authorization 01/17/2022-pending 6/7/223 pending 01/31/2022 approved with high copay. Bathing/ Shower/ Hygiene May shower with protection but do not get wound dressing(s) wet. - May purchase cast protector from [facility], [facility], or [employer] Edema Control - Lymphedema / SCD / Other Elevate legs to the level of the heart or above for 30 minutes daily and/or when sitting, a frequency of: - 3-4 times a day throughout the day. Avoid standing for long periods of time. Off-Loading Other: - this week try the defender boot use for walking and standing. Wound Treatment Wound #2 - T Great oe Wound Laterality: Right Cleanser: Normal Saline 3 x Per Week/30 Days Discharge Instructions: Cleanse the wound with Normal Saline prior to applying a clean dressing using gauze sponges, not tissue or cotton balls. Cleanser: Vashe, 8.5 (oz) (Generic) 3 x Per Week/30 Days Discharge Instructions: use to wash the toe with vashe and gauze. Prim Dressing: Hydrofera Blue Ready Foam, 2.5 x2.5 in 3 x Per Week/30 Days ary Discharge Instructions: Apply to wound bed as instructed Secondary Dressing: Woven Gauze Sponge, Non-Sterile 4x4 in 3 x  Per Week/30 Days Discharge Instructions: Apply rolled gauze under the toe to aid in keeping the toe from bending causing friction/shear. Secondary Dressing: Woven Gauze Sponges 2x2 in (Generic) 3 x Per Week/30 Days Discharge Instructions: Apply over primary dressing as directed. Secured With: Insurance underwriter, Sterile 2x75 (in/in) (Generic) 3 x Per Week/30 Days Discharge Instructions: Secure with stretch gauze as directed. Secured With: 61M Medipore H Soft Cloth Surgical T ape, 4 x 10 (in/yd) (Generic) 3 x Per Week/30 Days Discharge Instructions: Secure with tape as directed. Electronic Signature(s) Signed: 01/31/2022 5:11:12 PM By: Lenda Kelp PA-C Signed:  01/31/2022 5:24:59 PM By: Shawn Stall RN, BSN Entered By: Shawn Stall on 01/31/2022 13:09:28 -------------------------------------------------------------------------------- Problem List Details Patient Name: Date of Service: AYSA, LARIVEE 01/31/2022 12:30 PM Medical Record Number: [id number] Patient Account Number: [id number] Date of Birth/Sex: Treating RN: 12-29-1965 (56 y.o. [name], [name] Primary Care Provider: Hillard Danker Other Clinician: Referring Provider: Treating Provider/Extender: [name] in Treatment: 7 Active Problems ICD-10 Encounter Code Description Active Date MDM Diagnosis E11.621 Type 2 diabetes mellitus with foot ulcer 12/13/2021 No Yes L97.812 Non-pressure chronic ulcer of other part of right lower leg with fat layer 12/13/2021 No Yes exposed E11.42 Type 2 diabetes mellitus with diabetic polyneuropathy 12/13/2021 No Yes I10 Essential (primary) hypertension 12/13/2021 No Yes Inactive Problems Resolved Problems Electronic Signature(s) Signed: 01/31/2022 12:51:42 PM By: Lenda Kelp PA-C Entered By: Lenda Kelp on 01/31/2022 12:51:42 -------------------------------------------------------------------------------- Progress Note Details Patient Name: Date of Service: [name]. 01/31/2022 12:30 PM Medical Record Number: [id number] Patient Account Number: [id number] Date of Birth/Sex: Treating RN: 04-12-1966 (56 y.o. 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 She tells me that since at least last summer she has had a progressive callused area  that has opened closed but is never really healed. She has a history of calluses on her feet. She is a diabetic with neuropathy. She was referred to Dr. Allena Katz at [facility] who felt that this might be a large plantar wart she was treated with laser for 2 treatments and then subsequently with ammonium lactate none of this is apparently made any difference according to the patient. She has a very odd looking area with thick skin and callus and an almost crossed like area of open tissue. She also has erythema above this which she says is chronic. The other interesting part of her recent history is recurrent cellulitis on the right lower leg although this is separated from the area we are looking at. She has had trips to see infectious disease has been on Keflex for as long as 20 days. This is presumed to be strep. She saw Dr. Renold Don Past medical history includes type 2 diabetes, recurrent cellulitis of the right leg, right Achilles tendon repair a year ago for spontaneous rupture ABI in our clinic was 1.09 on the right 2/17; patient admitted the clinic last week had a thick callused area on the right lateral heel she is a diabetic with neuropathy. She had thick callus and across like area of open wound. I aggressively debrided this area to remove most of the callus and gave her Hydrofera Blue and heel  off loader, heel cup and she has been wearing this religiously trying to keep the pressure off the area. She comes in today with a cross-like area of injury has healed she has a new open area that I do not remember from last time. Still some dry flaking skin and callus but not nearly as much as last week at which time I had to use a scalpel to remove this. 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 She came in 2 weeks ago with a very thick hyperkeratotic callused area on her right lateral heel. There was a "crossed" shaped area of split tissue with an open wound at the bottom.  This had previously been treated as a possible wart without improvement. I removed all of the callus when she first came in here. This is mostly epithelialized over still thick skin but nothing like it was. She has 1 small open area that has not healed she has been more religious about offloading the area 10/27/2020 upon evaluation today patient appears to be doing well with regard to her wound although she does still apparently appear to have something still open in the base of the wound. Fortunately there is no evidence of infection at this time which is great news and in general I feel like she is doing decently well though she has a lot of callus buildup she is using the offloading shoe for her heel. At home she does not always wear this she tells me. Generally she is using flip-flops there. Of note her primary care provider did give her Keflex yesterday for cellulitis of this leg but has nothing to do with the heel location. 3/17; the patient's area actually looks a lot better. There is no open wound here. Much less callus. She was given a prescription for 40% urea cream I am not sure that really is required this week. She is going to require less friction on this area. I think she is going to need a different type of shoe going forward Readmission: 12-13-2028 upon evaluation today patient presents for reevaluation in the clinic she was last seen by Dr. Leanord Hawking on 11-03-2020 when she was discharged this was due to a crack on her heel. Currently she is actually having an issue with her great toe on the right foot. She tells me that this has been present currently for a couple of weeks since around November 22, 2021. She has had a lot of callus over this area in general but not an open wound until that time. Subsequently this has more recently opened and has Become a greater concern she is also noted some odor which is disconcerting to her. She tells me that her greatest concern is that "she does not lose  this toe". Again I completely understand her concern in this regard I definitely think we need to be aggressive in treating this and try to get things better. She is very Adult nurse. Patient does have a history of diabetes mellitus type 2 with diabetic neuropathy. She also tells me that she does have a history as well of hypertension which is pretty well controlled. She has not yet had an x-ray of the area and has not been on any antibiotics. Both are things I think we will need to do today. 12-20-2021 upon evaluation today patient appears to be doing well with regard to her toe ulcer all things considered. She has been tolerating the dressing changes without complication. I do have her x-ray  as well as her culture for review today the culture did so Staphylococcus aureus no MRSA noted which was great news susceptible to doxycycline which is what I did place her on last week. Overall the odor that she was noticing has also cleared which is great news. With regard to the x-ray there does not appear to be any signs of osteomyelitis or bone erosion at this point which is also great news in general and very pleased with where things stand. 12-27-2021 upon evaluation today patient appears to be doing well with regard to her wound this is actually measuring significantly better which is great news. Overall I am extremely pleased with where we stand today. No fevers, chills, nausea, vomiting, or diarrhea. 01-03-2022 upon evaluation today patient's wound is actually showing signs of significant improvement which is also news. I am very pleased with what we are seeing there is a little bit of callus buildup still but we are going to work on that today otherwise she seems to be making excellent progress which is great news. 01-10-2022 upon evaluation today patient appears to be doing well currently in regard to her toe ulcer. She has been tolerating the dressing changes without complication. Fortunately there does  not appear to be any evidence of active infection locally or systemically which is great news. No fevers, chills, nausea, vomiting, or diarrhea. 01-17-2022 upon evaluation today patient's toe was actually doing about the same this week there is not a tremendous improvement in size but overall the appearance of the wound is better it is much flatter I do think the [facility] has done well in that regard. We will still wait to hear back on the skin substitute. With that being said I do believe that the patient could benefit from again the skin substitute to try to help get this to heal much more effectively and quickly in the meantime I am trying to help flatten this out which I think is doing a pretty good job.. 6/7; wound on the plantar right great toe over the distal phalanx. Wound is slightly smaller but looks healthier there is rims of epithelialization she still has some callus on the toe indicative of inadequate pressure/friction relief. I talked to her about making sure in the forefoot offloading boot that the weight stays on her heel in stance phase 01-31-2022 upon evaluation patient's wound is still continuing to show signs of really minimal progress unfortunately. I actually believe that she may need to go into the total contact cast in order to get this to heal and I discussed that with her today as well. Obviously this has been a little bit of a barrier for her not because she does not want to do it and does not want to get healed but rather because it is going to prevent her from being able to drive which is obviously going to be a significant quality-of-life issue with what she does in the position she is in at work. 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 I do believe she could work with the cast on but the issue simply is that she cannot drive to get to work. Otherwise should be able to walk and do what she needs to  do. Objective Constitutional Well-nourished and well-hydrated in no acute distress. Vitals Time Taken: 12:39 PM, Height: 66 in, Weight: 220 lbs, BMI: 35.5, Temperature: 98.5 F, Pulse: 74 bpm, Respiratory Rate: 20 breaths/min,  Blood Pressure: 107/71 mmHg. Respiratory normal breathing without difficulty. Psychiatric this patient is able to make decisions and demonstrates good insight into disease process. Alert and Oriented x 3. pleasant and cooperative. General Notes: Upon inspection patient's wound bed actually showed signs of good granulation epithelization at this point. Fortunately I do not see any evidence of active infection locally or systemically which is great news and overall extremely pleased with where things stand today. With that being said I do believe that the wound is roughly about the same size as what I saw 2 weeks ago and that means were not making a tremendous improvement here. I am not saying we cannot get it healed this way but I think is going to take a much longer amount of time to do so. Integumentary (Hair, Skin) Wound #2 status is Open. Original cause of wound was Blister. The date acquired was: 11/22/2021. The wound has been in treatment 7 weeks. The wound is located on the Right T Great. The wound measures 0.9cm length x 0.9cm width x 0.1cm depth; 0.636cm^2 area and 0.064cm^3 volume. There is Fat Layer oe (Subcutaneous Tissue) exposed. There is no tunneling or undermining noted. There is a medium amount of serosanguineous drainage noted. The wound margin is distinct with the outline attached to the wound base. There is large (67-100%) red, pink, hyper - granulation within the wound bed. There is a small (1-33%) amount of necrotic tissue within the wound bed including Adherent Slough. 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 There was a Excisional Skin/Subcutaneous Tissue Debridement with a total area of 4 sq cm performed by Lenda Kelp, PA. With the following instrument(s): Curette to remove Viable and Non-Viable tissue/material. Material removed includes Callus, Subcutaneous Tissue, Slough, Skin: Dermis, and Skin: Epidermis after achieving pain control using Lidocaine 5% topical ointment. A time out was conducted at 12:55, prior to the start of the procedure. A Minimum amount of bleeding was controlled with Pressure. The procedure was tolerated well with a pain level of 0 throughout and a pain level of 0 following the procedure. Post Debridement Measurements: 0.9cm length x 0.9cm width x 0.1cm depth; 0.064cm^3 volume. Character of Wound/Ulcer Post Debridement is improved. Severity of Tissue Post Debridement is: Fat layer exposed. Post procedure Diagnosis Wound #2: Same as Pre-Procedure Plan Follow-up Appointments: Return Appointment in 1 week. Allen Derry, [place] and [place], Room 8 Wednesday 02/07/2022 0815 Allen Derry, [place] and [place], Room 8 Wednesday 02/14/2022 0815 Cellular or Tissue Based Products: Wound #2 Right T Great: oe Cellular or Tissue Based Product Type: - 01/10/2022 Epifix run insurance authorization 01/17/2022-pending 6/7/223 pending 01/31/2022 approved with high copay. Bathing/ Shower/ Hygiene: May shower with protection but do not get wound dressing(s) wet. - May purchase cast protector from [facility], [facility], or [employer] Edema Control - Lymphedema / SCD / Other: Elevate legs to the level of the heart or above for 30 minutes daily and/or when sitting, a frequency of: - 3-4 times a day throughout the day. Avoid standing for long periods of  time. Off-Loading: Other: - this week try the defender boot use for walking and standing. WOUND #2: -  T Great Wound Laterality: Right oe Cleanser: Normal Saline 3 x Per Week/30 Days Discharge Instructions: Cleanse the wound with Normal Saline prior to applying a clean dressing using gauze sponges, not tissue or cotton balls. Cleanser: Vashe, 8.5 (oz) (Generic) 3 x Per Week/30 Days Discharge Instructions: use to wash the toe with vashe and gauze. Prim Dressing: Hydrofera Blue Ready Foam, 2.5 x2.5 in 3 x Per Week/30 Days ary Discharge Instructions: Apply to wound bed as instructed Secondary Dressing: Woven Gauze Sponge, Non-Sterile 4x4 in 3 x Per Week/30 Days Discharge Instructions: Apply rolled gauze under the toe to aid in keeping the toe from bending causing friction/shear. Secondary Dressing: Woven Gauze Sponges 2x2 in (Generic) 3 x Per Week/30 Days Discharge Instructions: Apply over primary dressing as directed. Secured With: Insurance underwriter, Sterile 2x75 (in/in) (Generic) 3 x Per Week/30 Days Discharge Instructions: Secure with stretch gauze as directed. Secured With: 45M Medipore H Soft Cloth Surgical T ape, 4 x 10 (in/yd) (Generic) 3 x Per Week/30 Days Discharge Instructions: Secure with tape as directed. 1. I do believe the patient would benefit from a total contact cast. I discussed that with her today. 2. I am also can recommend that for the time being we will use a roll gauze up underneath her toe to try to keep pressure off the end of toe infection in particular. She voiced understanding. With that being said I do think that a total contact cast would be the optimal way to go and would be much better than even doing this we tried a Psychologist, forensic and just did not seem to fit her right unfortunately. Also I do not think it would have been as effective to be perfectly honest. We will see patient back for reevaluation in 1 week here in the clinic. If anything worsens  or changes patient will contact our office for additional recommendations. Electronic Signature(s) Signed: 01/31/2022 1:41:06 PM By: Lenda Kelp PA-C Entered By: Lenda Kelp on 01/31/2022 13:41:06 -------------------------------------------------------------------------------- SuperBill Details Patient Name: Date of Service: [name] 01/31/2022 Medical Record Number: [id number] Patient Account Number: [id number] Date of Birth/Sex: Treating RN: [birth year] (56 y.o. [name], [name] Primary Care Provider: Hillard Danker Other Clinician: Referring Provider: Treating Provider/Extender: [name] in Treatment: 7 Diagnosis Coding ICD-10 Codes Code Description [phone] Type 2 diabetes mellitus with foot ulcer L97.812 Non-pressure chronic ulcer of other part of right lower leg with fat layer exposed E11.42 Type 2 diabetes mellitus with diabetic polyneuropathy I10 Essential (primary) hypertension Facility Procedures CPT4 Code: [id number] Description: 11042 - DEB SUBQ TISSUE 20 SQ CM/< ICD-10 Diagnosis Description L97.812 Non-pressure chronic ulcer of other part of right lower leg with fat layer exp Modifier: osed Quantity: 1 Physician Procedures : CPT4 Code Description Modifier [id number] 11042 - WC PHYS SUBQ TISS 20 SQ CM ICD-10 Diagnosis Description L97.812 Non-pressure chronic ulcer of other part of right lower leg with fat layer exposed Quantity: 1 Electronic Signature(s) Signed: 01/31/2022 1:41:16 PM By: Lenda Kelp PA-C Entered By: Lenda Kelp on 01/31/2022 13:41:16

## 2022-02-03 ENCOUNTER — Encounter (HOSPITAL_BASED_OUTPATIENT_CLINIC_OR_DEPARTMENT_OTHER): Payer: 59 | Admitting: Physician Assistant

## 2022-02-07 ENCOUNTER — Encounter (HOSPITAL_BASED_OUTPATIENT_CLINIC_OR_DEPARTMENT_OTHER): Payer: 59 | Admitting: Physician Assistant

## 2022-02-07 DIAGNOSIS — E1142 Type 2 diabetes mellitus with diabetic polyneuropathy: Secondary | ICD-10-CM | POA: Diagnosis not present

## 2022-02-07 DIAGNOSIS — Z872 Personal history of diseases of the skin and subcutaneous tissue: Secondary | ICD-10-CM | POA: Diagnosis not present

## 2022-02-07 DIAGNOSIS — E11621 Type 2 diabetes mellitus with foot ulcer: Secondary | ICD-10-CM | POA: Diagnosis not present

## 2022-02-07 DIAGNOSIS — L97812 Non-pressure chronic ulcer of other part of right lower leg with fat layer exposed: Secondary | ICD-10-CM | POA: Diagnosis not present

## 2022-02-07 DIAGNOSIS — I10 Essential (primary) hypertension: Secondary | ICD-10-CM | POA: Diagnosis not present

## 2022-02-07 DIAGNOSIS — Z9889 Other specified postprocedural states: Secondary | ICD-10-CM | POA: Diagnosis not present

## 2022-02-07 DIAGNOSIS — L97512 Non-pressure chronic ulcer of other part of right foot with fat layer exposed: Secondary | ICD-10-CM | POA: Diagnosis not present

## 2022-02-07 NOTE — Progress Notes (Signed)
Donna [name], Donna [name] ([id number]) Visit Report for 02/07/2022 Arrival Information Details Patient Name: Date of Service: Donna [name], Donna [name] 02/07/2022 8:15 A M Medical Record Number: [id number] Patient Account Number: [id number] Date of Birth/Sex: Treating RN: 16-Jun-1966 (56 y.o. [name], [name] Primary Care Orvilla Truett: Pricilla Holm Other Clinician: Referring Azha Constantin: Treating Karryn Kosinski/Extender: Dreama Saa in Treatment: 8 Visit Information History Since Last Visit Added or deleted any medications: No Patient Arrived: Ambulatory Any new allergies or adverse reactions: No Arrival Time: 08:12 Had a fall or experienced change in No Accompanied By: self activities of daily living that may affect Transfer Assistance: None risk of falls: Patient Identification Verified: Yes Signs or symptoms of abuse/neglect since last visito No Secondary Verification Process Completed: Yes Hospitalized since last visit: No Patient Requires Transmission-Based Precautions: No Implantable device outside of the clinic excluding No Patient Has Alerts: No cellular tissue based products placed in the center since last visit: Has Dressing in Place as Prescribed: Yes Has Footwear/Offloading in Place as Prescribed: Yes Right: Wedge Shoe Pain Present Now: No Electronic Signature(s) Signed: 02/07/2022 4:19:27 PM By: Deon Pilling RN, BSN Entered By: Deon Pilling on 02/07/2022 08:14:16 -------------------------------------------------------------------------------- Encounter Discharge Information Details Patient Name: Date of Service: Donna [name] 02/07/2022 8:15 A M Medical Record Number: [id number] Patient Account Number: [id number] Date of Birth/Sex: Treating RN: 12-28-1965 (56 y.o. [name], [name] Primary Care Kataryna Mcquilkin: Pricilla Holm Other Clinician: Referring Jaretzi Droz: Treating Adyn Hoes/Extender: Dreama Saa in Treatment:  8 Encounter Discharge Information Items Post Procedure Vitals Discharge Condition: Stable Temperature (F): 97.9 Ambulatory Status: Ambulatory Pulse (bpm): 71 Discharge Destination: Home Respiratory Rate (breaths/min): 20 Transportation: Private Auto Blood Pressure (mmHg): 114/74 Accompanied By: self Schedule Follow-up Appointment: Yes Clinical Summary of Care: Electronic Signature(s) Signed: 02/07/2022 4:19:27 PM By: Deon Pilling RN, BSN Entered By: Deon Pilling on 02/07/2022 09:04:01 -------------------------------------------------------------------------------- Lower Extremity Assessment Details Patient Name: Date of Service: Donna [name], Donna [name] 02/07/2022 8:15 A M Medical Record Number: [id number] Patient Account Number: [id number] Date of Birth/Sex: Treating RN: [birth year] (56 y.o. [name] Primary Care Mycah Formica: Pricilla Holm Other Clinician: Referring Sahithi Ordoyne: Treating Caylee Vlachos/Extender: Dreama Saa in Treatment: 8 Edema Assessment Assessed: [Left: No] [Right: Yes] Edema: [Left: N] [Right: o] Calf Left: Right: Point of Measurement: 28 cm From Medial Instep 35 cm Ankle Left: Right: Point of Measurement: 8 cm From Medial Instep 22 cm Vascular Assessment Pulses: Dorsalis Pedis Palpable: [Right:Yes] Electronic Signature(s) Signed: 02/07/2022 4:19:27 PM By: Deon Pilling RN, BSN Entered By: Deon Pilling on 02/07/2022 08:18:33 -------------------------------------------------------------------------------- [place] Details Patient Name: Date of Service: Donna [name] 02/07/2022 8:15 A M Medical Record Number: [id number] Patient Account Number: [id number] Date of Birth/Sex: Treating RN: Dec 19, 1965 (56 y.o. [name], [name] Primary Care Gaylen Pereira: Pricilla Holm Other Clinician: Referring Ocie Tino: Treating Dyllen Menning/Extender: Dreama Saa in Treatment: 8 Active  Inactive Pain, Acute or Chronic Nursing Diagnoses: Pain, acute or chronic: actual or potential Potential alteration in comfort, pain Goals: Patient will verbalize adequate pain control and receive pain control interventions during procedures as needed Date Initiated: 12/13/2021 Target Resolution Date: 02/16/2022 Goal Status: Active Patient/caregiver will verbalize comfort level met Date Initiated: 12/13/2021 Target Resolution Date: 02/16/2022 Goal Status: Active Interventions: Encourage patient to take pain medications as prescribed Provide education on pain management Reposition patient for comfort Treatment Activities: Administer pain control measures as ordered : 12/13/2021 Notes: Wound/Skin Impairment Nursing Diagnoses: Knowledge deficit related to ulceration/compromised skin integrity Goals: Patient/caregiver  will verbalize understanding of skin care regimen Date Initiated: 12/13/2021 Target Resolution Date: 02/16/2022 Goal Status: Active Interventions: Assess patient/caregiver ability to obtain necessary supplies Assess patient/caregiver ability to perform ulcer/skin care regimen upon admission and as needed Provide education on ulcer and skin care Treatment Activities: Skin care regimen initiated : 12/13/2021 Topical wound management initiated : 12/13/2021 Notes: Electronic Signature(s) Signed: 02/07/2022 4:19:27 PM By: Deon Pilling RN, BSN Entered By: Deon Pilling on 02/07/2022 08:31:33 -------------------------------------------------------------------------------- Pain Assessment Details Patient Name: Date of Service: Donna [name] 02/07/2022 8:15 A M Medical Record Number: [id number] Patient Account Number: [id number] Date of Birth/Sex: Treating RN: 12-08-1965 (56 y.o. [name] Primary Care Kinslea Frances: Pricilla Holm Other Clinician: Referring Fread Kottke: Treating Bethzy Hauck/Extender: Dreama Saa in Treatment: 8 Active  Problems Location of Pain Severity and Description of Pain Patient Has Paino No Site Locations Rate the pain. Current Pain Level: 0 Pain Management and Medication Current Pain Management: Medication: No Cold Application: No Rest: No Massage: No Activity: No T.E.N.S.: No Heat Application: No Leg drop or elevation: No Is the Current Pain Management Adequate: Adequate How does your wound impact your activities of daily livingo Sleep: No Bathing: No Appetite: No Relationship With Others: No Bladder Continence: No Emotions: No Bowel Continence: No Work: No Toileting: No Drive: No Dressing: No Hobbies: No Engineer, maintenance) Signed: 02/07/2022 4:19:27 PM By: Deon Pilling RN, BSN Entered By: Deon Pilling on 02/07/2022 08:14:39 -------------------------------------------------------------------------------- Patient/Caregiver Education Details Patient Name: Date of Service: Donna [name] 6/21/2023andnbsp8:15 [place] Record Number: [id number] Patient Account Number: [id number] Date of Birth/Gender: Treating RN: November 03, 1965 (56 y.o. [name] Primary Care Physician: Pricilla Holm Other Clinician: Referring Physician: Treating Physician/Extender: Dreama Saa in Treatment: 8 Education Assessment Education Provided To: Patient Education Topics Provided Wound/Skin Impairment: Handouts: Skin Care Do's and Dont's Methods: Explain/Verbal Responses: Reinforcements needed Electronic Signature(s) Signed: 02/07/2022 4:19:27 PM By: Deon Pilling RN, BSN Entered By: Deon Pilling on 02/07/2022 08:31:47 -------------------------------------------------------------------------------- Wound Assessment Details Patient Name: Date of Service: Donna [name] 02/07/2022 8:15 A M Medical Record Number: [id number] Patient Account Number: [id number] Date of Birth/Sex: Treating RN: 1966/04/16 (56 y.o. [name], Meta.Reding Primary Care  Alfard Cochrane: Pricilla Holm Other Clinician: Referring Jaymie Misch: Treating Tiaira Arambula/Extender: Dreama Saa in Treatment: 8 Wound Status Wound Number: 2 Primary Etiology: Diabetic Wound/Ulcer of the Lower Extremity Wound Location: Right T Great oe Wound Status: Open Wounding Event: Blister Comorbid History: Hypertension, Type II Diabetes, Neuropathy Date Acquired: 11/22/2021 Weeks Of Treatment: 8 Clustered Wound: No Photos Wound Measurements Length: (cm) 0.9 Width: (cm) 0.9 Depth: (cm) 0.1 Area: (cm) 0.636 Volume: (cm) 0.064 % Reduction in Area: 82.4% % Reduction in Volume: 82.3% Epithelialization: Medium (34-66%) Tunneling: No Undermining: No Wound Description Classification: Grade 2 Wound Margin: Distinct, outline attached Exudate Amount: Medium Exudate Type: Serosanguineous Exudate Color: red, brown Foul Odor After Cleansing: No Slough/Fibrino No Wound Bed Granulation Amount: Large (67-100%) Exposed Structure Granulation Quality: Red, Pink, Hyper-granulation Fascia Exposed: No Necrotic Amount: None Present (0%) Fat Layer (Subcutaneous Tissue) Exposed: Yes Tendon Exposed: No Muscle Exposed: No Joint Exposed: No Bone Exposed: No Treatment Notes Wound #2 (Toe Great) Wound Laterality: Right Cleanser Normal Saline Discharge Instruction: Cleanse the wound with Normal Saline prior to applying a clean dressing using gauze sponges, not tissue or cotton balls. Peri-Wound Care Topical Primary Dressing Hydrofera Blue Ready Foam, 2.5 x2.5 in Discharge Instruction: Apply to wound bed as instructed Secondary Dressing Woven Gauze Sponge,  Non-Sterile 4x4 in Discharge Instruction: Apply rolled gauze under the toe to aid in keeping the toe from bending causing friction/shear. Woven Gauze Sponges 2x2 in Discharge Instruction: Apply over primary dressing as directed. Secured With Conforming Stretch Gauze Bandage, Sterile 2x75  (in/in) Discharge Instruction: Secure with stretch gauze as directed. 70M Medipore H Soft Cloth Surgical T ape, 4 x 10 (in/yd) Discharge Instruction: Secure with tape as directed. Compression Wrap Compression Stockings Add-Ons Notes TCC applied by Latina Frank. Electronic Signature(s) Signed: 02/07/2022 4:19:27 PM By: Deon Pilling RN, BSN Entered By: Deon Pilling on 02/07/2022 08:19:49 -------------------------------------------------------------------------------- Vitals Details Patient Name: Date of Service: Donna [name] 02/07/2022 8:15 A M Medical Record Number: [id number] Patient Account Number: [id number] Date of Birth/Sex: Treating RN: 1966/03/13 (56 y.o. [name], [name] Primary Care Sharonica Kraszewski: Pricilla Holm Other Clinician: Referring Hezekiah Veltre: Treating Soul Deveney/Extender: Dreama Saa in Treatment: 8 Vital Signs Time Taken: 08:11 Temperature (F): 97.9 Height (in): 66 Pulse (bpm): 71 Weight (lbs): 220 Respiratory Rate (breaths/min): 20 Body Mass Index (BMI): 35.5 Blood Pressure (mmHg): 114/74 Reference Range: 80 - 120 mg / dl Electronic Signature(s) Signed: 02/07/2022 4:19:27 PM By: Deon Pilling RN, BSN Entered By: Deon Pilling on 02/07/2022 08:14:30

## 2022-02-07 NOTE — Progress Notes (Addendum)
Donna [name] ([id number]) Visit Report for 02/07/2022 Chief Complaint Document Details Patient Name: Date of Service: Donna [name], Donna [name] 02/07/2022 8:15 A M Medical Record Number: [id number] Patient Account Number: [id number] Date of Birth/Sex: Treating RN: 1965/08/22 (56 y.o. [name] Primary Care Provider: Hillard Danker Other Clinician: Referring Provider: Treating Provider/Extender: [name] in Treatment: 8 Information Obtained from: Patient Chief Complaint Right great toe ulcer Electronic Signature(s) Signed: 02/07/2022 8:23:50 AM By: Lenda Kelp PA-C Entered By: Lenda Kelp on 02/07/2022 08:23:49 -------------------------------------------------------------------------------- Debridement Details Patient Name: Date of Service: Donna [name] 02/07/2022 8:15 A M Medical Record Number: [id number] Patient Account Number: [id number] Date of Birth/Sex: Treating RN: 14-Jul-1966 (56 y.o. [name], [name] Primary Care Provider: Hillard Danker Other Clinician: Referring Provider: Treating Provider/Extender: [name] in Treatment: 8 Debridement Performed for Assessment: Wound #2 Right T Great oe Performed By: Physician Lenda Kelp, PA Debridement Type: Debridement Severity of Tissue Pre Debridement: Fat layer exposed Level of Consciousness (Pre-procedure): Awake and Alert Pre-procedure Verification/Time Out Yes - 08:28 Taken: Start Time: 08:29 Pain Control: Lidocaine 5% topical ointment T Area Debrided (L x W): otal 2 (cm) x 2 (cm) = 4 (cm) Tissue and other material debrided: Viable, Non-Viable, Callus, Subcutaneous, Skin: Dermis , Skin: Epidermis, Hyper-granulation Level: Skin/Subcutaneous Tissue Debridement Description: Excisional Instrument: Curette Bleeding: Moderate Hemostasis Achieved: Silver Nitrate End Time: 08:35 Procedural Pain: 0 Post Procedural Pain: 0 Response  to Treatment: Procedure was tolerated well Level of Consciousness (Post- Awake and Alert procedure): Post Debridement Measurements of Total Wound Length: (cm) 0.9 Width: (cm) 0.9 Depth: (cm) 0.1 Volume: (cm) 0.064 Character of Wound/Ulcer Post Debridement: Improved Severity of Tissue Post Debridement: Fat layer exposed Post Procedure Diagnosis Same as Pre-procedure Electronic Signature(s) Signed: 02/07/2022 4:19:27 PM By: Shawn Stall RN, BSN Signed: 02/07/2022 5:09:34 PM By: Lenda Kelp PA-C Entered By: Shawn Stall on 02/07/2022 08:45:01 -------------------------------------------------------------------------------- HPI Details Patient Name: Date of Service: Donna [name] 02/07/2022 8:15 A M Medical Record Number: [id number] Patient Account Number: [id number] Date of Birth/Sex: Treating RN: 1966/07/25 (56 y.o. 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 She tells me that since at least last summer she has had a progressive callused area that has opened closed but is never really healed. She has a history of calluses on her feet. She is a diabetic with neuropathy. She was referred to Dr. Allena Katz at [facility] who felt that this might be a large plantar wart she was treated with laser for 2 treatments and then subsequently with ammonium lactate none of this is apparently made any difference according to the patient. She has a very odd looking area with thick skin and callus and an almost crossed like area of open tissue. She also has erythema above this which she says is chronic. The other interesting part of her recent history is recurrent cellulitis on the right lower leg  although this is separated from the area we are looking at. She has had trips to see infectious disease has been on Keflex for as long as 20 days. This is presumed to be strep. She saw Dr. Renold Don Past medical history includes type 2 diabetes, recurrent cellulitis of the right leg, right Achilles tendon  repair a year ago for spontaneous rupture ABI in our clinic was 1.09 on the right 2/17; patient admitted the clinic last week had a thick callused area on the right lateral heel she is a diabetic with neuropathy. She had thick callus and across like area of open wound. I aggressively debrided this area to remove most of the callus and gave her Hydrofera Blue and heel off loader, heel cup and she has been wearing this religiously trying to keep the pressure off the area. She comes in today with a cross-like area of injury has healed she has a new open area that I do not remember from last time. Still some dry flaking skin and callus but not nearly as much as last week at which time I had to use a scalpel to remove this. 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 She came in 2 weeks ago with a very thick hyperkeratotic callused area on her right lateral heel. There was a "crossed" shaped area of split tissue with an open wound at the bottom. This had previously been treated as a possible wart without improvement. I removed all of the callus when she first came in here. This is mostly epithelialized over still thick skin but nothing like it was. She has 1 small open area that has not healed she has been more religious about offloading the area 10/27/2020 upon evaluation today patient appears to be doing well with regard to her wound although she does still apparently appear to have something still open in the base of the wound. Fortunately there is no evidence of infection at this time which is great news and in general I feel like she is doing decently well though she has a lot of  callus buildup she is using the offloading shoe for her heel. At home she does not always wear this she tells me. Generally she is using flip-flops there. Of note her primary care provider did give her Keflex yesterday for cellulitis of this leg but has nothing to do with the heel location. 3/17; the patient's area actually looks a lot better. There is no open wound here. Much less callus. She was given a prescription for 40% urea cream I am not sure that really is required this week. She is going to require less friction on this area. I think she is going to need a different type of shoe going forward Readmission: 12-13-2028 upon evaluation today patient presents for reevaluation in the clinic she was last seen by Dr. Leanord Hawking on 11-03-2020 when she was discharged this was due to a crack on her heel. Currently she is actually having an issue with her great toe on the right foot. She tells me that this has been present currently for a couple of weeks since around November 22, 2021. She has had a lot of callus over this area in general but not an open wound until that time. Subsequently this has more recently opened and has Become a greater concern she is also noted some odor which is disconcerting to her. She tells me that her greatest concern is that "she does not lose this toe". Again I completely understand her concern in this regard I definitely think we need to be aggressive in treating this and try to get things better. She is very Adult nurse. Patient does have a history of diabetes mellitus type 2 with diabetic neuropathy. She also tells me that she does have a history as well of  hypertension which is pretty well controlled. She has not yet had an x-ray of the area and has not been on any antibiotics. Both are things I think we will need to do today. 12-20-2021 upon evaluation today patient appears to be doing well with regard to her toe ulcer all things considered. She has been tolerating the dressing  changes without complication. I do have her x-ray as well as her culture for review today the culture did so Staphylococcus aureus no MRSA noted which was great news susceptible to doxycycline which is what I did place her on last week. Overall the odor that she was noticing has also cleared which is great news. With regard to the x-ray there does not appear to be any signs of osteomyelitis or bone erosion at this point which is also great news in general and very pleased with where things stand. 12-27-2021 upon evaluation today patient appears to be doing well with regard to her wound this is actually measuring significantly better which is great news. Overall I am extremely pleased with where we stand today. No fevers, chills, nausea, vomiting, or diarrhea. 01-03-2022 upon evaluation today patient's wound is actually showing signs of significant improvement which is also news. I am very pleased with what we are seeing there is a little bit of callus buildup still but we are going to work on that today otherwise she seems to be making excellent progress which is great news. 01-10-2022 upon evaluation today patient appears to be doing well currently in regard to her toe ulcer. She has been tolerating the dressing changes without complication. Fortunately there does not appear to be any evidence of active infection locally or systemically which is great news. No fevers, chills, nausea, vomiting, or diarrhea. 01-17-2022 upon evaluation today patient's toe was actually doing about the same this week there is not a tremendous improvement in size but overall the appearance of the wound is better it is much flatter I do think the [facility] has done well in that regard. We will still wait to hear back on the skin substitute. With that being said I do believe that the patient could benefit from again the skin substitute to try to help get this to heal much more effectively and quickly in the meantime I am  trying to help flatten this out which I think is doing a pretty good job.. 6/7; wound on the plantar right great toe over the distal phalanx. Wound is slightly smaller but looks healthier there is rims of epithelialization she still has some callus on the toe indicative of inadequate pressure/friction relief. I talked to her about making sure in the forefoot offloading boot that the weight stays on her heel in stance phase 01-31-2022 upon evaluation patient's wound is still continuing to show signs of really minimal progress unfortunately. I actually believe that she may need to go into the total contact cast in order to get this to heal and I discussed that with her today as well. Obviously this has been a little bit of a barrier for her not because she does not want to do it and does not want to get healed but rather because it is going to prevent her from being able to drive which is obviously going to be a significant quality-of-life issue with what she does in the position she is in at work. 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 I  do believe she could work with the cast on but the issue simply is that she cannot drive to get to work. Otherwise should be able to walk and do what she needs to do. 02-07-2022 upon evaluation today patient appears to be doing well with regard to her wounds. She has been tolerating the dressing changes without complication. Fortunately I do not see any signs of infection although the wound itself is not significantly smaller as of yet. This is something that we are trying to work on I do believe a total contact cast would be the right way to go. I discussed this with the patient today as well. She is actually in agreement with giving this a trial although she is a little nervous about it being that she is claustrophobic. Electronic Signature(s) Signed: 02/07/2022 8:57:51 AM By: Lenda KelpStone III, Cedrik Heindl  PA-C Entered By: Lenda KelpStone III, Moss Berry on 02/07/2022 08:57:51 -------------------------------------------------------------------------------- Physical Exam Details Patient Name: Date of Service: Donna BollmanCURRIN, Gertrue R. 02/07/2022 8:15 A M Medical Record Number: [id number] Patient Account Number: [id number] Date of Birth/Sex: Treating RN: 06/08/1966 (56 y.o. Arta SilenceF) Deaton, Bobbi Primary Care Provider: Hillard Dankerrawford, Elizabeth Other Clinician: Referring Provider: Treating Provider/Extender: Jolee EwingStone III, Arden Tinoco Crawford, Elizabeth Weeks in Treatment: 8 Constitutional Well-nourished and well-hydrated in no acute distress. Respiratory normal breathing without difficulty. Psychiatric this patient is able to make decisions and demonstrates good insight into disease process. Alert and Oriented x 3. pleasant and cooperative. Notes Upon inspection patient's wound bed actually showed signs of good granulation and epithelization at this point. Fortunately I do not see any evidence of active infection locally or systemically which is great news and overall I feel like we are on the right track here. I do think that getting the pressure off a little better with the total contact cast would make a huge difference here. I did perform debridement of clearway necrotic debris including slough and biofilm down to good subcutaneous tissue and then used silver nitrate to cauterize the hypergranulation and bleeding which I think should do quite well for her. Electronic Signature(s) Signed: 02/07/2022 8:58:27 AM By: Lenda KelpStone III, Iliani Vejar PA-C Entered By: Lenda KelpStone III, Lenell Mcconnell on 02/07/2022 08:58:27 -------------------------------------------------------------------------------- Physician Orders Details Patient Name: Date of Service: Donna BollmanCURRIN, Margaree R. 02/07/2022 8:15 A M Medical Record Number: [id number] Patient Account Number: [id number] Date of Birth/Sex: Treating RN: 05/07/1966 (56 y.o. Debara PickettF) Deaton, Millard.LoaBobbi Primary Care Provider: Hillard Dankerrawford,  Elizabeth Other Clinician: Referring Provider: Treating Provider/Extender: Jolee EwingStone III, Charvis Lightner Crawford, Elizabeth Weeks in Treatment: 8 Verbal / Phone Orders: No Diagnosis Coding ICD-10 Coding Code Description E11.621 Type 2 diabetes mellitus with foot ulcer L97.812 Non-pressure chronic ulcer of other part of right lower leg with fat layer exposed E11.42 Type 2 diabetes mellitus with diabetic polyneuropathy I10 Essential (primary) hypertension Follow-up Appointments ppointment in 1 week. - Dr. Mikey BussingHoffman and [place], Room 8 Friday 1045am 02/09/2022 Return A Allen DerryHoyt Stone, [place] and [place], Room 8 Wednesday 02/14/2022 0815 Allen DerryHoyt Stone, [place] and [place], Room 8 Wednesday 02/21/2022 0815 Cellular or Tissue Based Products Wound #2 Right T Great oe Cellular or Tissue Based Product Type: - 01/10/2022 Epifix run insurance authorization 01/17/2022-pending 6/7/223 pending 01/31/2022 approved with high copay. Bathing/ Shower/ Hygiene May shower with protection but do not get wound dressing(s) wet. - May purchase cast protector from [facility], [facility], or [employer] Edema Control - Lymphedema / SCD / Other Elevate legs to the level of the heart or above for 30 minutes daily and/or when sitting, a frequency of: - 3-4 times a day throughout the  day. Avoid standing for long periods of time. Off-Loading Total Contact Cast to Right Lower Extremity Other: - this week try the defender boot use for walking and standing. Wound Treatment Wound #2 - T Great oe Wound Laterality: Right Cleanser: Normal Saline 1 x Per Week/30 Days Discharge Instructions: Cleanse the wound with Normal Saline prior to applying a clean dressing using gauze sponges, not tissue or cotton balls. Prim Dressing: Hydrofera Blue Ready Foam, 2.5 x2.5 in 1 x Per Week/30 Days ary Discharge Instructions: Apply to wound bed as instructed Secondary Dressing: Woven Gauze Sponge, Non-Sterile 4x4 in 1 x Per Week/30 Days Discharge Instructions: Apply rolled gauze under  the toe to aid in keeping the toe from bending causing friction/shear. Secondary Dressing: Woven Gauze Sponges 2x2 in (Generic) 1 x Per Week/30 Days Discharge Instructions: Apply over primary dressing as directed. Secured With: Insurance underwriter, Sterile 2x75 (in/in) (Generic) 1 x Per Week/30 Days Discharge Instructions: Secure with stretch gauze as directed. Secured With: 80M Medipore H Soft Cloth Surgical T ape, 4 x 10 (in/yd) (Generic) 1 x Per Week/30 Days Discharge Instructions: Secure with tape as directed. Electronic Signature(s) Signed: 02/07/2022 4:19:27 PM By: Shawn Stall RN, BSN Signed: 02/07/2022 5:09:34 PM By: Lenda Kelp PA-C Entered By: Shawn Stall on 02/07/2022 08:48:48 -------------------------------------------------------------------------------- Problem List Details Patient Name: Date of Service: Donna [name] 02/07/2022 8:15 A M Medical Record Number: [id number] Patient Account Number: [id number] Date of Birth/Sex: Treating RN: Sep 14, 1965 (56 y.o. [name], [name] Primary Care Provider: Hillard Danker Other Clinician: Referring Provider: Treating Provider/Extender: [name] in Treatment: 8 Active Problems ICD-10 Encounter Code Description Active Date MDM Diagnosis E11.621 Type 2 diabetes mellitus with foot ulcer 12/13/2021 No Yes L97.812 Non-pressure chronic ulcer of other part of right lower leg with fat layer 12/13/2021 No Yes exposed E11.42 Type 2 diabetes mellitus with diabetic polyneuropathy 12/13/2021 No Yes I10 Essential (primary) hypertension 12/13/2021 No Yes Inactive Problems Resolved Problems Electronic Signature(s) Signed: 02/07/2022 8:23:43 AM By: Lenda Kelp PA-C Entered By: Lenda Kelp on 02/07/2022 08:23:42 -------------------------------------------------------------------------------- Progress Note Details Patient Name: Date of Service: Donna [name] 02/07/2022 8:15 A  M Medical Record Number: [id number] Patient Account Number: [id number] Date of Birth/Sex: Treating RN: 1965-11-22 (56 y.o. 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 She tells me that since at least last summer she has had a progressive callused area that has opened closed but is never really healed. She has a history of calluses on her feet. She is a diabetic with neuropathy. She was referred to Dr. Allena Katz at [facility] who felt that this might be a large plantar wart she was treated with laser for 2 treatments and then subsequently with ammonium lactate none of this is apparently made any difference according to the patient. She has a very odd looking area with thick skin and callus and an almost crossed like area of open tissue. She also has erythema above this which she says is chronic. The other interesting part of her recent history is recurrent cellulitis on the right lower leg although this is separated from the area we are looking at. She has had trips to see infectious  disease has been on Keflex for as long as 20 days. This is presumed to be strep. She saw Dr. Renold Don Past medical history includes type 2 diabetes, recurrent cellulitis of the right leg, right Achilles tendon repair a year ago for spontaneous rupture ABI in our clinic was 1.09 on the right 2/17; patient admitted the clinic last week had a thick callused area on the right lateral heel she is a diabetic with neuropathy. She had thick callus and across like area of open wound. I aggressively debrided this area to remove most of the callus and gave her Hydrofera  Blue and heel off loader, heel cup and she has been wearing this religiously trying to keep the pressure off the area. She comes in today with a cross-like area of injury has healed she has a new open area that I do not remember from last time. Still some dry flaking skin and callus but not nearly as much as last week at which time I had to use a scalpel to remove this. 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 She came in 2 weeks ago with a very thick hyperkeratotic callused area on her right lateral heel. There was a "crossed" shaped area of split tissue with an open wound at the bottom. This had previously been treated as a possible wart without improvement. I removed all of the callus when she first came in here. This is mostly epithelialized over still thick skin but nothing like it was. She has 1 small open area that has not healed she has been more religious about offloading the area 10/27/2020 upon evaluation today patient appears to be doing well with regard to her wound although she does still apparently appear to have something still open in the base of the wound. Fortunately there is no evidence of infection at this time which is great news and in general I feel like she is doing decently well though she has a lot of callus buildup she is using the offloading shoe for her heel. At home she does not always wear this she tells me. Generally she is using flip-flops there. Of note her primary care provider did give her Keflex yesterday for cellulitis of this leg but has nothing to do with the heel location. 3/17; the patient's area actually looks a lot better. There is no open wound here. Much less callus. She was given a prescription for 40% urea cream I am not sure that really is required this week. She is going to require less friction on this area. I think she is going to need a different type of shoe going forward Readmission: 12-13-2028 upon evaluation today  patient presents for reevaluation in the clinic she was last seen by Dr. Leanord Hawking on 11-03-2020 when she was discharged this was due to a crack on her heel. Currently she is actually having an issue with her great toe on the right foot. She tells me that this has been present currently for a couple of weeks since around November 22, 2021. She has had a lot of callus over this area in general but not an open wound until that time. Subsequently this has more recently opened and has Become a greater concern she is also noted some odor which is disconcerting to her. She tells me that her greatest concern is that "she does not lose this toe". Again I completely understand her concern in this regard I definitely think we need to be aggressive in  treating this and try to get things better. She is very Adult nurse. Patient does have a history of diabetes mellitus type 2 with diabetic neuropathy. She also tells me that she does have a history as well of hypertension which is pretty well controlled. She has not yet had an x-ray of the area and has not been on any antibiotics. Both are things I think we will need to do today. 12-20-2021 upon evaluation today patient appears to be doing well with regard to her toe ulcer all things considered. She has been tolerating the dressing changes without complication. I do have her x-ray as well as her culture for review today the culture did so Staphylococcus aureus no MRSA noted which was great news susceptible to doxycycline which is what I did place her on last week. Overall the odor that she was noticing has also cleared which is great news. With regard to the x-ray there does not appear to be any signs of osteomyelitis or bone erosion at this point which is also great news in general and very pleased with where things stand. 12-27-2021 upon evaluation today patient appears to be doing well with regard to her wound this is actually measuring significantly better which is great  news. Overall I am extremely pleased with where we stand today. No fevers, chills, nausea, vomiting, or diarrhea. 01-03-2022 upon evaluation today patient's wound is actually showing signs of significant improvement which is also news. I am very pleased with what we are seeing there is a little bit of callus buildup still but we are going to work on that today otherwise she seems to be making excellent progress which is great news. 01-10-2022 upon evaluation today patient appears to be doing well currently in regard to her toe ulcer. She has been tolerating the dressing changes without complication. Fortunately there does not appear to be any evidence of active infection locally or systemically which is great news. No fevers, chills, nausea, vomiting, or diarrhea. 01-17-2022 upon evaluation today patient's toe was actually doing about the same this week there is not a tremendous improvement in size but overall the appearance of the wound is better it is much flatter I do think the [facility] has done well in that regard. We will still wait to hear back on the skin substitute. With that being said I do believe that the patient could benefit from again the skin substitute to try to help get this to heal much more effectively and quickly in the meantime I am trying to help flatten this out which I think is doing a pretty good job.. 6/7; wound on the plantar right great toe over the distal phalanx. Wound is slightly smaller but looks healthier there is rims of epithelialization she still has some callus on the toe indicative of inadequate pressure/friction relief. I talked to her about making sure in the forefoot offloading boot that the weight stays on her heel in stance phase 01-31-2022 upon evaluation patient's wound is still continuing to show signs of really minimal progress unfortunately. I actually believe that she may need to go into the total contact cast in order to get this to heal and I  discussed that with her today as well. Obviously this has been a little bit of a barrier for her not because she does not want to do it and does not want to get healed but rather because it is going to prevent her from being able to drive which is obviously going to  be a significant quality-of-life issue with what she does in the position she is in at work. 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 I do believe she could work with the cast on but the issue simply is that she cannot drive to get to work. Otherwise should be able to walk and do what she needs to do. 02-07-2022 upon evaluation today patient appears to be doing well with regard to her wounds. She has been tolerating the dressing changes without complication. Fortunately I do not see any signs of infection although the wound itself is not significantly smaller as of yet. This is something that we are trying to work on I do believe a total contact cast would be the right way to go. I discussed this with the patient today as well. She is actually in agreement with giving this a trial although she is a little nervous about it being that she is claustrophobic. Objective Constitutional Well-nourished and well-hydrated in no acute distress. Vitals Time Taken: 8:11 AM, Height: 66 in, Weight: 220 lbs, BMI: 35.5, Temperature: 97.9 F, Pulse: 71 bpm, Respiratory Rate: 20 breaths/min, Blood Pressure: 114/74 mmHg. Respiratory normal breathing without difficulty. Psychiatric this patient is able to make decisions and demonstrates good insight into disease process. Alert and Oriented x 3. pleasant and cooperative. General Notes: Upon inspection patient's wound bed actually showed signs of good granulation and epithelization at this point. Fortunately I do not see any evidence of active infection locally or systemically which is great news and overall I feel like we are on the right  track here. I do think that getting the pressure off a little better with the total contact cast would make a huge difference here. I did perform debridement of clearway necrotic debris including slough and biofilm down to good subcutaneous tissue and then used silver nitrate to cauterize the hypergranulation and bleeding which I think should do quite well for her. Integumentary (Hair, Skin) Wound #2 status is Open. Original cause of wound was Blister. The date acquired was: 11/22/2021. The wound has been in treatment 8 weeks. The wound is located on the Right T Great. The wound measures 0.9cm length x 0.9cm width x 0.1cm depth; 0.636cm^2 area and 0.064cm^3 volume. There is Fat Layer oe (Subcutaneous Tissue) exposed. There is no tunneling or undermining noted. There is a medium amount of serosanguineous drainage noted. The wound margin is distinct with the outline attached to the wound base. There is large (67-100%) red, pink, hyper - granulation within the wound bed. There is no necrotic tissue within the wound bed. 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 There was a Excisional Skin/Subcutaneous Tissue Debridement with a total area of 4 sq cm performed by Lenda Kelp, PA. With the following instrument(s): Curette to remove Viable and Non-Viable tissue/material. Material removed includes Callus, Subcutaneous Tissue, Skin: Dermis, Skin: Epidermis, and Hyper-granulation after achieving pain control using Lidocaine 5% topical ointment. A time out was conducted at 08:28, prior to the start of  the procedure. A Moderate amount of bleeding was controlled with Silver Nitrate.  The procedure was tolerated well with a pain level of 0 throughout and a pain level of 0 following the procedure. Post Debridement Measurements: 0.9cm length x 0.9cm width x 0.1cm depth; 0.064cm^3 volume. Character of Wound/Ulcer Post Debridement is improved. Severity of Tissue Post Debridement is: Fat layer exposed. Post procedure Diagnosis Wound #2: Same as Pre-Procedure Plan Follow-up Appointments: Return Appointment in 1 week. - Dr. Mikey Bussing and [facility], Room 8 Friday 1045am 02/09/2022 Allen Derry, [place] and [place], Room 8 Wednesday 02/14/2022 0815 Allen Derry, [place] and [place], Room 8 Wednesday 02/21/2022 0815 Cellular or Tissue Based Products: Wound #2 Right T Great: oe Cellular or Tissue Based Product Type: - 01/10/2022 Ep[employer]ix run insurance authorization 01/17/2022-pending 6/7/223 pending 01/31/2022 approved with high copay. Bathing/ Shower/ Hygiene: May shower with protection but do not get wound dressing(s) wet. - May purchase cast protector from [facility], [facility], or [employer] Edema Control - Lymphedema / SCD / Other: Elevate legs to the level of the heart or above for 30 minutes daily and/or when sitting, a frequency of: - 3-4 times a day throughout the day. Avoid standing for long periods of time. Off-Loading: T Contact Cast to Right Lower Extremity otal Other: - this week try the defender boot use for walking and standing. WOUND #2: - T Great Wound Laterality: Right oe Cleanser: Normal Saline 1 x Per Week/30 Days Discharge Instructions: Cleanse the wound with Normal Saline prior to applying a clean dressing using gauze sponges, not tissue or cotton balls. Prim Dressing: Hydrofera Blue Ready Foam, 2.5 x2.5 in 1 x Per Week/30 Days ary Discharge Instructions: Apply to wound bed as instructed Secondary Dressing: Woven Gauze Sponge, Non-Sterile 4x4 in 1 x Per Week/30 Days Discharge Instructions: Apply rolled gauze under the  toe to aid in keeping the toe from bending causing friction/shear. Secondary Dressing: Woven Gauze Sponges 2x2 in (Generic) 1 x Per Week/30 Days Discharge Instructions: Apply over primary dressing as directed. Secured With: Insurance underwriter, Sterile 2x75 (in/in) (Generic) 1 x Per Week/30 Days Discharge Instructions: Secure with stretch gauze as directed. Secured With: 95M Medipore H Soft Cloth Surgical T ape, 4 x 10 (in/yd) (Generic) 1 x Per Week/30 Days Discharge Instructions: Secure with tape as directed. 1. Would recommend that we going continue with the wound care measures as before and the patient is in agreement the plan this includes the use of the [facility] dressing which I think is still good to be the best way to go. 2. Also can recommend the total contact casting which I think is still appropriate currently. We will see patient back for reevaluation in 1 week here in the clinic. If anything worsens or changes patient will contact our office for additional recommendations. Electronic Signature(s) Signed: 02/07/2022 8:58:49 AM By: Lenda Kelp PA-C Entered By: Lenda Kelp on 02/07/2022 08:58:49 -------------------------------------------------------------------------------- SuperBill Details Patient Name: Date of Service: KEIANNA, SIGNER 02/07/2022 Medical Record Number: [id number] Patient Account Number: [id number] Date of Birth/Sex: Treating RN: 1965-12-16 (56 y.o. [name] Primary Care Provider: Hillard Danker Other Clinician: Referring Provider: Treating Provider/Extender: [name] in Treatment: 8 Diagnosis Coding ICD-10 Codes Code Description [phone] Type 2 diabetes mellitus with foot ulcer L97.812 Non-pressure chronic ulcer of other part of right lower leg with fat layer exposed E11.42 Type 2 diabetes mellitus with diabetic polyneuropathy I10 Essential (primary) hypertension Facility  Procedures CPT4 Code: [id number] Description: 11042 - DEB SUBQ TISSUE 20 SQ CM/< ICD-10 Diagnosis Description L97.812 Non-pressure chronic ulcer  of other part of right lower leg with fat layer exp Modifier: osed Quantity: 1 Physician Procedures : CPT4 Code Description Modifier [id number] 11042 - WC PHYS SUBQ TISS 20 SQ CM ICD-10 Diagnosis Description L97.812 Non-pressure chronic ulcer of other part of right lower leg with fat layer exposed Quantity: 1 Electronic Signature(s) Signed: 02/07/2022 8:59:02 AM By: Lenda Kelp PA-C Entered By: Lenda Kelp on 02/07/2022 08:59:02

## 2022-02-09 ENCOUNTER — Encounter (HOSPITAL_BASED_OUTPATIENT_CLINIC_OR_DEPARTMENT_OTHER): Payer: 59 | Admitting: Physician Assistant

## 2022-02-09 DIAGNOSIS — E1142 Type 2 diabetes mellitus with diabetic polyneuropathy: Secondary | ICD-10-CM | POA: Diagnosis not present

## 2022-02-09 DIAGNOSIS — I10 Essential (primary) hypertension: Secondary | ICD-10-CM | POA: Diagnosis not present

## 2022-02-09 DIAGNOSIS — Z9889 Other specified postprocedural states: Secondary | ICD-10-CM | POA: Diagnosis not present

## 2022-02-09 DIAGNOSIS — E11621 Type 2 diabetes mellitus with foot ulcer: Secondary | ICD-10-CM

## 2022-02-09 DIAGNOSIS — L97812 Non-pressure chronic ulcer of other part of right lower leg with fat layer exposed: Secondary | ICD-10-CM | POA: Diagnosis not present

## 2022-02-09 DIAGNOSIS — Z872 Personal history of diseases of the skin and subcutaneous tissue: Secondary | ICD-10-CM | POA: Diagnosis not present

## 2022-02-14 ENCOUNTER — Encounter (HOSPITAL_BASED_OUTPATIENT_CLINIC_OR_DEPARTMENT_OTHER): Payer: 59 | Admitting: Physician Assistant

## 2022-02-14 DIAGNOSIS — E1142 Type 2 diabetes mellitus with diabetic polyneuropathy: Secondary | ICD-10-CM | POA: Diagnosis not present

## 2022-02-14 DIAGNOSIS — I10 Essential (primary) hypertension: Secondary | ICD-10-CM | POA: Diagnosis not present

## 2022-02-14 DIAGNOSIS — L97812 Non-pressure chronic ulcer of other part of right lower leg with fat layer exposed: Secondary | ICD-10-CM | POA: Diagnosis not present

## 2022-02-14 DIAGNOSIS — E11621 Type 2 diabetes mellitus with foot ulcer: Secondary | ICD-10-CM | POA: Diagnosis not present

## 2022-02-14 DIAGNOSIS — Z9889 Other specified postprocedural states: Secondary | ICD-10-CM | POA: Diagnosis not present

## 2022-02-14 DIAGNOSIS — L97512 Non-pressure chronic ulcer of other part of right foot with fat layer exposed: Secondary | ICD-10-CM | POA: Diagnosis not present

## 2022-02-14 DIAGNOSIS — Z872 Personal history of diseases of the skin and subcutaneous tissue: Secondary | ICD-10-CM | POA: Diagnosis not present

## 2022-02-14 NOTE — Progress Notes (Signed)
[name], [name] ([id number]) Visit Report for 02/14/2022 Chief Complaint Document Details Patient Name: Date of Service: Donna [name], Donna [name] 02/14/2022 8:15 A M Medical Record Number: [id number] Patient Account Number: [id number] Date of Birth/Sex: Treating RN: 1966-08-13 (56 y.o. [name] Primary Care Provider: Hillard Danker Other Clinician: Referring Provider: Treating Provider/Extender: [name] in Treatment: 9 Information Obtained from: Patient Chief Complaint Right great toe ulcer Electronic Signature(s) Signed: 02/14/2022 8:16:15 AM By: Lenda Kelp PA-C Entered By: Lenda Kelp on 02/14/2022 08:16:15 -------------------------------------------------------------------------------- Problem List Details Patient Name: Date of Service: Donna [name], Donna [name] 02/14/2022 8:15 A M Medical Record Number: [id number] Patient Account Number: [id number] Date of Birth/Sex: Treating RN: 01-01-1966 (56 y.o. [name], [name] Primary Care Provider: Hillard Danker Other Clinician: Referring Provider: Treating Provider/Extender: [name] in Treatment: 9 Active Problems ICD-10 Encounter Code Description Active Date MDM Diagnosis E11.621 Type 2 diabetes mellitus with foot ulcer 12/13/2021 No Yes L97.812 Non-pressure chronic ulcer of other part of right lower leg with fat layer 12/13/2021 No Yes exposed E11.42 Type 2 diabetes mellitus with diabetic polyneuropathy 12/13/2021 No Yes I10 Essential (primary) hypertension 12/13/2021 No Yes Inactive Problems Resolved Problems Electronic Signature(s) Signed: 02/14/2022 8:16:06 AM By: Lenda Kelp PA-C Entered By: Lenda Kelp on 02/14/2022 08:16:06

## 2022-02-15 ENCOUNTER — Other Ambulatory Visit: Payer: Self-pay | Admitting: Internal Medicine

## 2022-02-15 ENCOUNTER — Other Ambulatory Visit (HOSPITAL_COMMUNITY): Payer: Self-pay

## 2022-02-15 MED ORDER — LISINOPRIL-HYDROCHLOROTHIAZIDE 20-25 MG PO TABS
1.0000 | ORAL_TABLET | Freq: Every day | ORAL | 1 refills | Status: DC
Start: 2022-02-15 — End: 2022-08-27
  Filled 2022-02-15: qty 90, 90d supply, fill #0
  Filled 2022-05-13: qty 90, 90d supply, fill #1

## 2022-02-21 ENCOUNTER — Encounter (HOSPITAL_BASED_OUTPATIENT_CLINIC_OR_DEPARTMENT_OTHER): Payer: 59 | Attending: Physician Assistant | Admitting: Physician Assistant

## 2022-02-21 DIAGNOSIS — L97512 Non-pressure chronic ulcer of other part of right foot with fat layer exposed: Secondary | ICD-10-CM | POA: Diagnosis not present

## 2022-02-21 DIAGNOSIS — I10 Essential (primary) hypertension: Secondary | ICD-10-CM | POA: Insufficient documentation

## 2022-02-21 DIAGNOSIS — E11621 Type 2 diabetes mellitus with foot ulcer: Secondary | ICD-10-CM | POA: Insufficient documentation

## 2022-02-21 DIAGNOSIS — E1142 Type 2 diabetes mellitus with diabetic polyneuropathy: Secondary | ICD-10-CM | POA: Insufficient documentation

## 2022-02-21 DIAGNOSIS — L97812 Non-pressure chronic ulcer of other part of right lower leg with fat layer exposed: Secondary | ICD-10-CM | POA: Insufficient documentation

## 2022-02-21 NOTE — Progress Notes (Addendum)
KAELYNNE, CHRISTLEY ([id number]) Visit Report for 02/21/2022 Chief Complaint Document Details Patient Name: Date of Service: Donna [name], Donna [name] 02/21/2022 8:15 A M Medical Record Number: [id number] Patient Account Number: [id number] Date of Birth/Sex: Treating RN: [birth year] (56 y.o. [name] Primary Care Provider: Hillard Danker Other Clinician: Referring Provider: Treating Provider/Extender: [name] in Treatment: 10 Information Obtained from: Patient Chief Complaint Right great toe ulcer Electronic Signature(s) Signed: 02/21/2022 8:28:35 AM By: Lenda Kelp PA-C Entered By: Lenda Kelp on 02/21/2022 08:28:35 -------------------------------------------------------------------------------- Debridement Details Patient Name: Date of Service: Donna [name], Donna [name] 02/21/2022 8:15 A M Medical Record Number: [id number] Patient Account Number: [id number] Date of Birth/Sex: Treating RN: 1966-01-11 (56 y.o. Orville Govern Primary Care Provider: Hillard Danker Other Clinician: Referring Provider: Treating Provider/Extender: [name] in Treatment: 10 Debridement Performed for Assessment: Wound #2 Right T Great oe Performed By: Physician Lenda Kelp, PA Debridement Type: Debridement Severity of Tissue Pre Debridement: Fat layer exposed Level of Consciousness (Pre-procedure): Awake and Alert Pre-procedure Verification/Time Out Yes - 08:40 Taken: Start Time: 08:45 Pain Control: Lidocaine 4% Topical Solution T Area Debrided (L x W): otal 1 (cm) x 1 (cm) = 1 (cm) Tissue and other material debrided: Non-Viable, Callus Level: Non-Viable Tissue Debridement Description: Selective/Open Wound Instrument: Curette Bleeding: None Procedural Pain: 0 Post Procedural Pain: 0 Response to Treatment: Procedure was tolerated well Level of Consciousness (Post- Awake and Alert procedure): Post Debridement  Measurements of Total Wound Length: (cm) 0.4 Width: (cm) 0.5 Depth: (cm) 0.1 Volume: (cm) 0.016 Character of Wound/Ulcer Post Debridement: Improved Severity of Tissue Post Debridement: Fat layer exposed Post Procedure Diagnosis Same as Pre-procedure Electronic Signature(s) Signed: 02/21/2022 4:22:37 PM By: Lenda Kelp PA-C Signed: 02/21/2022 4:36:02 PM By: Redmond Pulling RN, BSN Entered By: Redmond Pulling on 02/21/2022 08:48:06 -------------------------------------------------------------------------------- HPI Details Patient Name: Date of Service: Donna [name] 02/21/2022 8:15 A M Medical Record Number: [id number] Patient Account Number: [id number] Date of Birth/Sex: Treating RN: Mar 20, 1966 (56 y.o. 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 She tells me that since at least last summer she has had a progressive callused area that has opened closed but is never really healed. She has a history of calluses on her feet. She is a diabetic with neuropathy. She was referred to Dr. Allena Katz at [facility] who felt that this might be a large plantar wart she was treated with laser for 2 treatments and then subsequently with ammonium lactate none of this is apparently made any difference according to the patient. She has a very odd looking area with thick skin and callus and an almost crossed like area of open tissue. She also has erythema above this which she says is chronic. The other interesting part of her recent history is recurrent cellulitis on the right lower leg although this is separated from the area we are looking at. She has had trips to see infectious disease has been on Keflex for  as long as 20 days. This is presumed to be strep. She saw Dr. Renold Don Past medical history includes type 2 diabetes, recurrent cellulitis of the right leg, right Achilles tendon repair a year ago for spontaneous rupture ABI in our clinic was 1.09 on  the right 2/17; patient admitted the clinic last week had a thick callused area on the right lateral heel she is a diabetic with neuropathy. She had thick callus and across like area of open wound. I aggressively debrided this area to remove most of the callus and gave her Hydrofera Blue and heel off loader, heel cup and she has been wearing this religiously trying to keep the pressure off the area. She comes in today with a cross-like area of injury has healed she has a new open area that I do not remember from last time. Still some dry flaking skin and callus but not nearly as much as last week at which time I had to use a scalpel to remove this. 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 She came in 2 weeks ago with a very thick hyperkeratotic callused area on her right lateral heel. There was a "crossed" shaped area of split tissue with an open wound at the bottom. This had previously been treated as a possible wart without improvement. I removed all of the callus when she first came in here. This is mostly epithelialized over still thick skin but nothing like it was. She has 1 small open area that has not healed she has been more religious about offloading the area 10/27/2020 upon evaluation today patient appears to be doing well with regard to her wound although she does still apparently appear to have something still open in the base of the wound. Fortunately there is no evidence of infection at this time which is great news and in general I feel like she is doing decently well though she has a lot of callus buildup she is using the offloading shoe for her heel. At home she does not always wear this she tells me. Generally she is  using flip-flops there. Of note her primary care provider did give her Keflex yesterday for cellulitis of this leg but has nothing to do with the heel location. 3/17; the patient's area actually looks a lot better. There is no open wound here. Much less callus. She was given a prescription for 40% urea cream I am not sure that really is required this week. She is going to require less friction on this area. I think she is going to need a different type of shoe going forward Readmission: 12-13-2028 upon evaluation today patient presents for reevaluation in the clinic she was last seen by Dr. Leanord Hawking on 11-03-2020 when she was discharged this was due to a crack on her heel. Currently she is actually having an issue with her great toe on the right foot. She tells me that this has been present currently for a couple of weeks since around November 22, 2021. She has had a lot of callus over this area in general but not an open wound until that time. Subsequently this has more recently opened and has Become a greater concern she is also noted some odor which is disconcerting to her. She tells me that her greatest concern is that "she does not lose this toe". Again I completely understand her concern in this regard I definitely think we need to be aggressive in treating this and try to get things better. She is very Adult nurse. Patient does have a history of diabetes mellitus type 2 with diabetic neuropathy. She also tells me that she does have a history as well of hypertension which is pretty well controlled. She has not yet had an x-ray of  the area and has not been on any antibiotics. Both are things I think we will need to do today. 12-20-2021 upon evaluation today patient appears to be doing well with regard to her toe ulcer all things considered. She has been tolerating the dressing changes without complication. I do have her x-ray as well as her culture for review today the culture did so Staphylococcus aureus  no MRSA noted which was great news susceptible to doxycycline which is what I did place her on last week. Overall the odor that she was noticing has also cleared which is great news. With regard to the x-ray there does not appear to be any signs of osteomyelitis or bone erosion at this point which is also great news in general and very pleased with where things stand. 12-27-2021 upon evaluation today patient appears to be doing well with regard to her wound this is actually measuring significantly better which is great news. Overall I am extremely pleased with where we stand today. No fevers, chills, nausea, vomiting, or diarrhea. 01-03-2022 upon evaluation today patient's wound is actually showing signs of significant improvement which is also news. I am very pleased with what we are seeing there is a little bit of callus buildup still but we are going to work on that today otherwise she seems to be making excellent progress which is great news. 01-10-2022 upon evaluation today patient appears to be doing well currently in regard to her toe ulcer. She has been tolerating the dressing changes without complication. Fortunately there does not appear to be any evidence of active infection locally or systemically which is great news. No fevers, chills, nausea, vomiting, or diarrhea. 01-17-2022 upon evaluation today patient's toe was actually doing about the same this week there is not a tremendous improvement in size but overall the appearance of the wound is better it is much flatter I do think the [facility] has done well in that regard. We will still wait to hear back on the skin substitute. With that being said I do believe that the patient could benefit from again the skin substitute to try to help get this to heal much more effectively and quickly in the meantime I am trying to help flatten this out which I think is doing a pretty good job.. 6/7; wound on the plantar right great toe over the distal  phalanx. Wound is slightly smaller but looks healthier there is rims of epithelialization she still has some callus on the toe indicative of inadequate pressure/friction relief. I talked to her about making sure in the forefoot offloading boot that the weight stays on her heel in stance phase 01-31-2022 upon evaluation patient's wound is still continuing to show signs of really minimal progress unfortunately. I actually believe that she may need to go into the total contact cast in order to get this to heal and I discussed that with her today as well. Obviously this has been a little bit of a barrier for her not because she does not want to do it and does not want to get healed but rather because it is going to prevent her from being able to drive which is obviously going to be a significant quality-of-life issue with what she does in the position she is in at work. 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 I do believe she could work with the cast on but the issue simply is  that she cannot drive to get to work. Otherwise should be able to walk and do what she needs to do. 02-07-2022 upon evaluation today patient appears to be doing well with regard to her wounds. She has been tolerating the dressing changes without complication. Fortunately I do not see any signs of infection although the wound itself is not significantly smaller as of yet. This is something that we are trying to work on I do believe a total contact cast would be the right way to go. I discussed this with the patient today as well. She is actually in agreement with giving this a trial although she is a little nervous about it being that she is claustrophobic. 6/23; patient presents for obligatory cast exchange. She reports that her heel feels sore from the total contact cast. On that she had no issues. 02-14-2022 upon evaluation today patient's wound is actually showing  signs of excellent improvement. There does not appear to be any signs of active infection at this time and overall the toe looks amazing in just 1 week's time. We have come down almost half compared to where we were before size wise and this is also him as well. Overall I think that the casting has done extremely good for her and the toe in general looks significantly improved. I think this week since we want to have to mess with the wound bed itself just the callus around she will likely have even better improvement come next time. 02-21-2022 upon evaluation today patient appears to be doing well with regard to her toe. She is actually showing signs of improvement this is a little bit slower than she would like to see but nonetheless we are seeing significant changes in the right direction in my opinion. Fortunately she is also developing a lot less callus on the end of the toe which is helpful for her as well in my opinion. Electronic Signature(s) Signed: 02/21/2022 8:55:40 AM By: Lenda Kelp PA-C Entered By: Lenda Kelp on 02/21/2022 08:55:40 -------------------------------------------------------------------------------- Physical Exam Details Patient Name: Date of Service: Donna [name], Donna [name] 02/21/2022 8:15 A M Medical Record Number: [id number] Patient Account Number: [id number] Date of Birth/Sex: Treating RN: 1966/03/16 (56 y.o. [name] Primary Care Provider: Hillard Danker Other Clinician: Referring Provider: Treating Provider/Extender: [name] in Treatment: 10 Constitutional Well-nourished and well-hydrated in no acute distress. Respiratory normal breathing without difficulty. Psychiatric this patient is able to make decisions and demonstrates good insight into disease process. Alert and Oriented x 3. pleasant and cooperative. Notes Patient's wound did not require sharp debridement of the center part of the wound I just removed  callus around the edges she tolerated that today without complication and postdebridement the wound bed actually appears to be doing much better which is great news. Electronic Signature(s) Signed: 02/21/2022 8:55:53 AM By: Lenda Kelp PA-C Entered By: Lenda Kelp on 02/21/2022 08:55:53 -------------------------------------------------------------------------------- Physician Orders Details Patient Name: Date of Service: Donna [name], Donna [name] 02/21/2022 8:15 A M Medical Record Number: [id number] Patient Account Number: [id number] Date of Birth/Sex: Treating RN: [birth year] (56 y.o. Orville Govern Primary Care Provider: Hillard Danker Other Clinician: Referring Provider: Treating Provider/Extender: [name] in Treatment: 10 Verbal / Phone Orders: No Diagnosis Coding ICD-10 Coding Code Description E11.621 Type 2 diabetes mellitus with foot ulcer L97.812 Non-pressure chronic ulcer of other part of right lower leg with fat layer exposed E11.42 Type 2 diabetes mellitus with diabetic polyneuropathy  I10 Essential (primary) hypertension Follow-up Appointments ppointment in 1 week. Allen Derry, PA and [place], Room 8 Wednesday 02/28/2022 0815 Return A Cellular or Tissue Based Products Wound #2 Right T Great oe Cellular or Tissue Based Product Type: - 01/10/2022 Epifix run insurance authorization 01/17/2022-pending 6/7/223 pending 01/31/2022 approved with high copay. Bathing/ Shower/ Hygiene May shower with protection but do not get wound dressing(s) wet. - May purchase cast protector from [facility], [facility], or [employer] Edema Control - Lymphedema / SCD / Other Elevate legs to the level of the heart or above for 30 minutes daily and/or when sitting, a frequency of: - 3-4 times a day throughout the day. Avoid standing for long periods of time. Off-Loading Total Contact Cast to Right Lower Extremity Other: - this week try the defender boot use for walking  and standing. Wound Treatment Wound #2 - T Great oe Wound Laterality: Right Cleanser: Normal Saline 1 x Per Week/30 Days Discharge Instructions: Cleanse the wound with Normal Saline prior to applying a clean dressing using gauze sponges, not tissue or cotton balls. Prim Dressing: Promogran Prisma Matrix, 4.34 (sq in) (silver collagen) 1 x Per Week/30 Days ary Discharge Instructions: Moisten collagen with saline or hydrogel Secondary Dressing: ALLEVYN Heel 4 1/2in x 5 1/2in / 10.5cm x 13.5cm 1 x Per Week/30 Days Discharge Instructions: Apply over primary dressing as directed. Secondary Dressing: Woven Gauze Sponge, Non-Sterile 4x4 in 1 x Per Week/30 Days Discharge Instructions: Apply rolled gauze under the toe to aid in keeping the toe from bending causing friction/shear. Secondary Dressing: Woven Gauze Sponges 2x2 in (Generic) 1 x Per Week/30 Days Discharge Instructions: Apply over primary dressing as directed. Secured With: Insurance underwriter, Sterile 2x75 (in/in) (Generic) 1 x Per Week/30 Days Discharge Instructions: Secure with stretch gauze as directed. Secured With: 21M Medipore H Soft Cloth Surgical T ape, 4 x 10 (in/yd) (Generic) 1 x Per Week/30 Days Discharge Instructions: Secure with tape as directed. Electronic Signature(s) Signed: 02/21/2022 4:22:37 PM By: Lenda Kelp PA-C Signed: 02/21/2022 4:36:02 PM By: Redmond Pulling RN, BSN Entered By: Redmond Pulling on 02/21/2022 08:50:17 -------------------------------------------------------------------------------- Problem List Details Patient Name: Date of Service: Donna [name] 02/21/2022 8:15 A M Medical Record Number: [id number] Patient Account Number: [id number] Date of Birth/Sex: Treating RN: July 22, 1966 (56 y.o. [name], [name] Primary Care Provider: Hillard Danker Other Clinician: Referring Provider: Treating Provider/Extender: [name] in Treatment: 10 Active  Problems ICD-10 Encounter Code Description Active Date MDM Diagnosis E11.621 Type 2 diabetes mellitus with foot ulcer 12/13/2021 No Yes L97.812 Non-pressure chronic ulcer of other part of right lower leg with fat layer 12/13/2021 No Yes exposed E11.42 Type 2 diabetes mellitus with diabetic polyneuropathy 12/13/2021 No Yes I10 Essential (primary) hypertension 12/13/2021 No Yes Inactive Problems Resolved Problems Electronic Signature(s) Signed: 02/21/2022 8:28:30 AM By: Lenda Kelp PA-C Entered By: Lenda Kelp on 02/21/2022 08:28:30 -------------------------------------------------------------------------------- Progress Note Details Patient Name: Date of Service: Donna [name] 02/21/2022 8:15 A M Medical Record Number: [id number] Patient Account Number: [id number] Date of Birth/Sex: Treating RN: [birth year] (56 y.o. 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 She tells me that since at least last summer she has had a  progressive callused area that has opened closed but is never really healed. She has a history of calluses on her feet. She is a diabetic with neuropathy. She was referred to Dr. Allena Katz at [facility] who felt that this might be a large plantar wart she was treated with laser for 2 treatments and then subsequently with ammonium lactate none of this is apparently made any difference according to the patient. She has a very odd looking area with thick skin and callus and an almost crossed like area of open tissue. She also has erythema above this which she says is chronic. The other interesting part  of her recent history is recurrent cellulitis on the right lower leg although this is separated from the area we are looking at. She has had trips to see infectious disease has been on Keflex for as long as 20 days. This is presumed to be strep. She saw Dr. Renold Don Past medical history includes type 2 diabetes, recurrent cellulitis of the right leg, right Achilles tendon repair a year ago for spontaneous rupture ABI in our clinic was 1.09 on the right 2/17; patient admitted the clinic last week had a thick callused area on the right lateral heel she is a diabetic with neuropathy. She had thick callus and across like area of open wound. I aggressively debrided this area to remove most of the callus and gave her Hydrofera Blue and heel off loader, heel cup and she has been wearing this religiously trying to keep the pressure off the area. She comes in today with a cross-like area of injury has healed she has a new open area that I do not remember from last time. Still some dry flaking skin and callus but not nearly as much as last week at which time I had to use a scalpel to remove this. 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 She came in 2 weeks ago with a very thick hyperkeratotic callused area on her right lateral heel. There was a "crossed" shaped area of split tissue with an open wound at the bottom. This had previously been treated as a possible wart without improvement. I removed all of the callus when she first came in here. This is mostly epithelialized over still thick skin but nothing like it was. She has 1 small open area that has not healed she has been more religious about offloading the area 10/27/2020 upon evaluation today patient appears to be doing well with regard to her wound although she does still apparently appear to have something still open in the base of the wound. Fortunately there is no evidence of infection at this time which is great news and in  general I feel like she is doing decently well though she has a lot of callus buildup she is using the offloading shoe for her heel. At home she does not always wear this she tells me. Generally she is using flip-flops there. Of note her primary care provider did give her Keflex yesterday for cellulitis of this leg but has nothing to do with the heel location. 3/17; the patient's area actually looks a lot better. There is no open wound here. Much less callus. She was given a prescription for 40% urea cream I am not sure that really is required this week. She is going to require less friction on this area. I think she is going to need a different type of shoe going forward Readmission: 12-13-2028 upon evaluation  today patient presents for reevaluation in the clinic she was last seen by Dr. Robson on 11-03-2020 when she was discharged Leanord Hawkingthis was due to a crack on her heel. Currently she is actually having an issue with her great toe on the right foot. She tells me that this has been present currently for a couple of weeks since around November 22, 2021. She has had a lot of callus over this area in general but not an open wound until that time. Subsequently this has more recently opened and has Become a greater concern she is also noted some odor which is disconcerting to her. She tells me that her greatest concern is that "she does not lose this toe". Again I completely understand her concern in this regard I definitely think we need to be aggressive in treating this and try to get things better. She is very Adult nurseappreciative. Patient does have a history of diabetes mellitus type 2 with diabetic neuropathy. She also tells me that she does have a history as well of hypertension which is pretty well controlled. She has not yet had an x-ray of the area and has not been on any antibiotics. Both are things I think we will need to do today. 12-20-2021 upon evaluation today patient appears to be doing well with regard to her  toe ulcer all things considered. She has been tolerating the dressing changes without complication. I do have her x-ray as well as her culture for review today the culture did so Staphylococcus aureus no MRSA noted which was great news susceptible to doxycycline which is what I did place her on last week. Overall the odor that she was noticing has also cleared which is great news. With regard to the x-ray there does not appear to be any signs of osteomyelitis or bone erosion at this point which is also great news in general and very pleased with where things stand. 12-27-2021 upon evaluation today patient appears to be doing well with regard to her wound this is actually measuring significantly better which is great news. Overall I am extremely pleased with where we stand today. No fevers, chills, nausea, vomiting, or diarrhea. 01-03-2022 upon evaluation today patient's wound is actually showing signs of significant improvement which is also news. I am very pleased with what we are seeing there is a little bit of callus buildup still but we are going to work on that today otherwise she seems to be making excellent progress which is great news. 01-10-2022 upon evaluation today patient appears to be doing well currently in regard to her toe ulcer. She has been tolerating the dressing changes without complication. Fortunately there does not appear to be any evidence of active infection locally or systemically which is great news. No fevers, chills, nausea, vomiting, or diarrhea. 01-17-2022 upon evaluation today patient's toe was actually doing about the same this week there is not a tremendous improvement in size but overall the appearance of the wound is better it is much flatter I do think [facility] done well in that regard. We will still wait to hear back on the skin substitute. With that being said I do believe that the patient could benefit from again the skin substitute to try to help get  this to heal much more effectively and quickly in the meantime I am trying to help flatten this out which I think is doing a pretty good job.. 6/7; wound on the plantar right great toe over the distal phalanx.  Wound is slightly smaller but looks healthier there is rims of epithelialization she still has some callus on the toe indicative of inadequate pressure/friction relief. I talked to her about making sure in the forefoot offloading boot that the weight stays on her heel in stance phase 01-31-2022 upon evaluation patient's wound is still continuing to show signs of really minimal progress unfortunately. I actually believe that she may need to go into the total contact cast in order to get this to heal and I discussed that with her today as well. Obviously this has been a little bit of a barrier for her not because she does not want to do it and does not want to get healed but rather because it is going to prevent her from being able to drive which is obviously going to be a significant quality-of-life issue with what she does in the position she is in at work. 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 I do believe she could work with the cast on but the issue simply is that she cannot drive to get to work. Otherwise should be able to walk and do what she needs to do. 02-07-2022 upon evaluation today patient appears to be doing well with regard to her wounds. She has been tolerating the dressing changes without complication. Fortunately I do not see any signs of infection although the wound itself is not significantly smaller as of yet. This is something that we are trying to work on I do believe a total contact cast would be the right way to go. I discussed this with the patient today as well. She is actually in agreement with giving this a trial although she is a little nervous about it being that she is claustrophobic. 6/23; patient  presents for obligatory cast exchange. She reports that her heel feels sore from the total contact cast. On that she had no issues. 02-14-2022 upon evaluation today patient's wound is actually showing signs of excellent improvement. There does not appear to be any signs of active infection at this time and overall the toe looks amazing in just 1 week's time. We have come down almost half compared to where we were before size wise and this is also him as well. Overall I think that the casting has done extremely good for her and the toe in general looks significantly improved. I think this week since we want to have to mess with the wound bed itself just the callus around she will likely have even better improvement come next time. 02-21-2022 upon evaluation today patient appears to be doing well with regard to her toe. She is actually showing signs of improvement this is a little bit slower than she would like to see but nonetheless we are seeing significant changes in the right direction in my opinion. Fortunately she is also developing a lot less callus on the end of the toe which is helpful for her as well in my opinion. Objective Constitutional Well-nourished and well-hydrated in no acute distress. Vitals Time Taken: 8:23 AM, Height: 66 in, Weight: 220 lbs, BMI: 35.5, Temperature: 98.3 F, Pulse: 73 bpm, Respiratory Rate: 18 breaths/min, Blood Pressure: 113/73 mmHg. Respiratory normal breathing without difficulty. Psychiatric this patient is able to make decisions and demonstrates good insight into disease process. Alert and Oriented x 3. pleasant and cooperative. General Notes: Patient's wound did not require sharp debridement of the center part of the  wound I just removed callus around the edges she tolerated that today without complication and postdebridement the wound bed actually appears to be doing much better which is great news. Integumentary (Hair, Skin) Wound #2 status is Open.  Original cause of wound was Blister. The date acquired was: 11/22/2021. The wound has been in treatment 10 weeks. The wound is located on the Right T Great. The wound measures 0.4cm length x 0.5cm width x 0.1cm depth; 0.157cm^2 area and 0.016cm^3 volume. There is Fat Layer oe (Subcutaneous Tissue) exposed. There is no tunneling or undermining noted. There is a medium amount of serosanguineous drainage noted. The wound margin is distinct with the outline attached to the wound base. There is large (67-100%) pink, pale granulation within the wound bed. There is no necrotic tissue within the wound bed. 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 There was a Selective/Open Wound Non-Viable Tissue Debridement with a total area of 1 sq cm performed by Lenda Kelp, PA. With the following instrument(s): Curette to remove Non-Viable tissue/material. Material removed includes Callus after achieving pain control using Lidocaine 4% Topical Solution. No specimens were taken. A time out was conducted at 08:40, prior to the start of the procedure. There was no bleeding. The procedure was tolerated well with a pain level of 0 throughout and a pain level of 0 following the procedure. Post Debridement Measurements: 0.4cm length x 0.5cm width x 0.1cm depth; 0.016cm^3 volume. Character of Wound/Ulcer Post Debridement is improved. Severity of Tissue Post Debridement is: Fat layer exposed. Post procedure Diagnosis Wound #2: Same as Pre-Procedure Plan Follow-up Appointments: Return Appointment in 1 week. Allen Derry, PA  and [place], Room 8 Wednesday 02/28/2022 0815 Cellular or Tissue Based Products: Wound #2 Right T Great: oe Cellular or Tissue Based Product Type: - 01/10/2022 Epifix run insurance authorization 01/17/2022-pending 6/7/223 pending 01/31/2022 approved with high copay. Bathing/ Shower/ Hygiene: May shower with protection but do not get wound dressing(s) wet. - May purchase cast protector from [facility], [facility], or [employer] Edema Control - Lymphedema / SCD / Other: Elevate legs to the level of the heart or above for 30 minutes daily and/or when sitting, a frequency of: - 3-4 times a day throughout the day. Avoid standing for long periods of time. Off-Loading: T Contact Cast to Right Lower Extremity otal Other: - this week try the defender boot use for walking and standing. WOUND #2: - T Great Wound Laterality: Right oe Cleanser: Normal Saline 1 x Per Week/30 Days Discharge Instructions: Cleanse the wound with Normal Saline prior to applying a clean dressing using gauze sponges, not tissue or cotton balls. Prim Dressing: Promogran Prisma Matrix, 4.34 (sq in) (silver collagen) 1 x Per Week/30 Days ary Discharge Instructions: Moisten collagen with saline or hydrogel Secondary Dressing: ALLEVYN Heel 4 1/2in x 5 1/2in / 10.5cm x 13.5cm 1 x Per Week/30 Days Discharge Instructions: Apply over primary dressing as directed. Secondary Dressing: Woven Gauze Sponge, Non-Sterile 4x4 in 1 x Per Week/30 Days Discharge Instructions: Apply rolled gauze under the toe to aid in keeping the toe from bending causing friction/shear. Secondary Dressing: Woven Gauze Sponges 2x2 in (Generic) 1  x Per Week/30 Days Discharge Instructions: Apply over primary dressing as directed. Secured With: Insurance underwriter, Sterile 2x75 (in/in) (Generic) 1 x Per Week/30 Days Discharge Instructions: Secure with stretch gauze as directed. Secured With: 59M Medipore H Soft Cloth Surgical T ape, 4 x 10 (in/yd) (Generic) 1 x Per  Week/30 Days Discharge Instructions: Secure with tape as directed. 1. We are getting continue with the total contact casting which I do believe is beneficial for her. 2. I am also can recommend that we have the patient continue to monitor for any signs of worsening or infection. 3. We will also switch over to a silver collagen dressing in place of the [facility] there is not nearly as much drainage now as there was and I think this may help to grow new tissue faster which would be awesome for her. We will see patient back for reevaluation in 1 week here in the clinic. If anything worsens or changes patient will contact our office for additional recommendations. Electronic Signature(s) Signed: 02/21/2022 8:56:23 AM By: Lenda Kelp PA-C Entered By: Lenda Kelp on 02/21/2022 08:56:22 -------------------------------------------------------------------------------- Total Contact Cast Details Patient Name: Date of Service: Donna [name], Donna [name] 02/21/2022 8:15 A M Medical Record Number: [id number] Patient Account Number: [id number] Date of Birth/Sex: Treating RN: March 06, 1966 (56 y.o. Orville Govern Primary Care Provider: Hillard Danker Other Clinician: Referring Provider: Treating Provider/Extender: [name] in Treatment: 10 T Contact Cast Applied for Wound Assessment: otal Wound #2 Right T Great oe Performed By: Physician Lenda Kelp, PA Post Procedure Diagnosis Same as Pre-procedure Electronic Signature(s) Signed: 02/21/2022 4:34:16 PM By: Lenda Kelp PA-C Signed: 02/21/2022 4:36:02 PM By: Redmond Pulling RN, BSN Entered By: Redmond Pulling on 02/21/2022 16:26:46 -------------------------------------------------------------------------------- SuperBill Details Patient Name: Date of Service: Donna [name] 02/21/2022 Medical Record Number: [id number] Patient Account Number: [id number] Date of Birth/Sex: Treating RN: Feb 12, 1966 (56 y.o. [name], [name] Primary Care Provider: Hillard Danker Other Clinician: Referring Provider: Treating Provider/Extender: [name] in Treatment: 10 Diagnosis Coding ICD-10 Codes Code Description [phone] Type 2 diabetes mellitus with foot ulcer L97.812 Non-pressure chronic ulcer of other part of right lower leg with fat layer exposed E11.42 Type 2 diabetes mellitus with diabetic polyneuropathy I10 Essential (primary) hypertension Facility Procedures CPT4 Code: [id number] Description: [phone] - DEBRIDE WOUND 1ST 20 SQ CM OR < ICD-10 Diagnosis Description L97.812 Non-pressure chronic ulcer of other part of right lower leg with fat layer expos Modifier: ed Quantity: 1 Physician Procedures : CPT4 Code Description Modifier [id number] - WC PHYS DEBR WO ANESTH 20 SQ CM ICD-10 Diagnosis Description L97.812 Non-pressure chronic ulcer of other part of right lower leg with fat layer exposed Quantity: 1 Electronic Signature(s) Signed: 02/21/2022 8:56:30 AM By: Lenda Kelp PA-C Entered By: Lenda Kelp on 02/21/2022 08:56:30

## 2022-02-21 NOTE — Progress Notes (Signed)
Donna [name], Donna [name] ([id number]) Visit Report for 02/21/2022 Arrival Information Details Patient Name: Date of Service: Donna [name], Donna [name] 02/21/2022 8:15 A M Medical Record Number: [id number] Patient Account Number: [id number] Date of Birth/Sex: Treating RN: 11/17/1965 (56 y.o. Donna [name] Primary Care Donna [name]: Donna [name] Other Clinician: Referring Donna [name]: Treating Donna [name]/Extender: Donna [name] in Treatment: 10 Visit Information History Since Last Visit Added or deleted any medications: No Patient Arrived: Ambulatory Any new allergies or adverse reactions: No Arrival Time: 08:21 Had a fall or experienced change in No Accompanied By: self activities of daily living that may affect Transfer Assistance: None risk of falls: Patient Identification Verified: Yes Signs or symptoms of abuse/neglect since last visito No Secondary Verification Process Completed: Yes Hospitalized since last visit: No Patient Requires Transmission-Based Precautions: No Implantable device outside of the clinic excluding No Patient Has Alerts: No cellular tissue based products placed in the center since last visit: Has Dressing in Place as Prescribed: Yes Has Compression in Place as Prescribed: Yes Pain Present Now: No Electronic Signature(s) Signed: 02/21/2022 4:36:02 PM By: Sharyn Creamer RN, BSN Entered By: Sharyn Creamer on 02/21/2022 08:22:38 -------------------------------------------------------------------------------- Encounter Discharge Information Details Patient Name: Date of Service: Donna [name] 02/21/2022 8:15 A M Medical Record Number: [id number] Patient Account Number: [id number] Date of Birth/Sex: Treating RN: 05-29-1966 (56 y.o. Donna [name] Primary Care Donna [name]: Donna [name] Other Clinician: Referring Donna [name]: Treating Donna [name]/Extender: Donna [name] in Treatment: 10 Encounter Discharge  Information Items Post Procedure Vitals Discharge Condition: Stable Temperature (F): 98.3 Ambulatory Status: Ambulatory Pulse (bpm): 73 Discharge Destination: Home Respiratory Rate (breaths/min): 18 Transportation: Private Auto Blood Pressure (mmHg): 113/73 Accompanied By: self Schedule Follow-up Appointment: Yes Clinical Summary of Care: Patient Declined Electronic Signature(s) Signed: 02/21/2022 4:36:02 PM By: Sharyn Creamer RN, BSN Entered By: Sharyn Creamer on 02/21/2022 09:25:30 -------------------------------------------------------------------------------- Lower Extremity Assessment Details Patient Name: Date of Service: Donna [name] 02/21/2022 8:15 A M Medical Record Number: [id number] Patient Account Number: [id number] Date of Birth/Sex: Treating RN: 21-May-1966 (56 y.o. Donna [name] Primary Care Fia Hebert: Donna [name] Other Clinician: Referring Donna [name]: Treating Donna [name]/Extender: Donna [name] in Treatment: 10 Edema Assessment Assessed: [Left: No] [Right: No] Edema: [Left: N] [Right: o] Calf Left: Right: Point of Measurement: 28 cm From Medial Instep 35.8 cm Ankle Left: Right: Point of Measurement: 8 cm From Medial Instep 21.8 cm Vascular Assessment Pulses: Dorsalis Pedis Palpable: [Right:Yes] Electronic Signature(s) Signed: 02/21/2022 4:36:02 PM By: Sharyn Creamer RN, BSN Entered By: Sharyn Creamer on 02/21/2022 08:35:00 -------------------------------------------------------------------------------- [place] Details Patient Name: Date of Service: Donna [name]. 02/21/2022 8:15 A M Medical Record Number: [id number] Patient Account Number: [id number] Date of Birth/Sex: Treating RN: 04-09-1966 (56 y.o. Donna [name] Primary Care Donna [name]: Donna [name] Other Clinician: Referring Donna [name]: Treating Donna [name]/Extender: Donna [name] in Treatment:  10 Active Inactive Pain, Acute or Chronic Nursing Diagnoses: Pain, acute or chronic: actual or potential Potential alteration in comfort, pain Goals: Patient will verbalize adequate pain control and receive pain control interventions during procedures as needed Date Initiated: 12/13/2021 Target Resolution Date: 03/02/2022 Goal Status: Active Patient/caregiver will verbalize comfort level met Date Initiated: 12/13/2021 Target Resolution Date: 03/01/2022 Goal Status: Active Interventions: Encourage patient to take pain medications as prescribed Provide education on pain management Reposition patient for comfort Treatment Activities: Administer pain control measures as ordered : 12/13/2021 Notes: Wound/Skin Impairment Nursing Diagnoses: Knowledge deficit related to ulceration/compromised skin integrity Goals: Patient/caregiver will  verbalize understanding of skin care regimen Date Initiated: 12/13/2021 Target Resolution Date: 03/01/2022 Goal Status: Active Interventions: Assess patient/caregiver ability to obtain necessary supplies Assess patient/caregiver ability to perform ulcer/skin care regimen upon admission and as needed Provide education on ulcer and skin care Treatment Activities: Skin care regimen initiated : 12/13/2021 Topical wound management initiated : 12/13/2021 Notes: Electronic Signature(s) Signed: 02/21/2022 4:36:02 PM By: Sharyn Creamer RN, BSN Entered By: Sharyn Creamer on 02/21/2022 08:44:13 -------------------------------------------------------------------------------- Pain Assessment Details Patient Name: Date of Service: Donna [name] 02/21/2022 8:15 A M Medical Record Number: [id number] Patient Account Number: [id number] Date of Birth/Sex: Treating RN: 1966-07-27 (56 y.o. Donna [name] Primary Care Donna [name]: Donna [name] Other Clinician: Referring Donna [name]: Treating Donna [name]/Extender: Donna [name] in Treatment:  10 Active Problems Location of Pain Severity and Description of Pain Patient Has Paino No Site Locations Pain Management and Medication Current Pain Management: Electronic Signature(s) Signed: 02/21/2022 4:36:02 PM By: Sharyn Creamer RN, BSN Entered By: Sharyn Creamer on 02/21/2022 08:24:16 -------------------------------------------------------------------------------- Patient/Caregiver Education Details Patient Name: Date of Service: Donna [name] 7/5/2023andnbsp8:15 [place] Record Number: [id number] Patient Account Number: [id number] Date of Birth/Gender: Treating RN: [birth year] (56 y.o. Donna [name] Primary Care Physician: Donna [name] Other Clinician: Referring Physician: Treating Physician/Extender: Donna [name] in Treatment: 10 Education Assessment Education Provided To: Patient Education Topics Provided Wound/Skin Impairment: Methods: Explain/Verbal Responses: State content correctly Electronic Signature(s) Signed: 02/21/2022 4:36:02 PM By: Sharyn Creamer RN, BSN Entered By: Sharyn Creamer on 02/21/2022 08:44:49 -------------------------------------------------------------------------------- Wound Assessment Details Patient Name: Date of Service: Donna [name] 02/21/2022 8:15 A M Medical Record Number: [id number] Patient Account Number: [id number] Date of Birth/Sex: Treating RN: Aug 31, 1965 (56 y.o. Donna [name] Primary Care Ruston Fedora: Donna [name] Other Clinician: Referring Finley Dinkel: Treating Rayner Erman/Extender: Donna [name] in Treatment: 10 Wound Status Wound Number: 2 Primary Etiology: Diabetic Wound/Ulcer of the Lower Extremity Wound Location: Right T Great oe Wound Status: Open Wounding Event: Blister Comorbid History: Hypertension, Type II Diabetes, Neuropathy Date Acquired: 11/22/2021 Weeks Of Treatment: 10 Clustered Wound: No Photos Wound Measurements Length:  (cm) 0.4 Width: (cm) 0.5 Depth: (cm) 0.1 Area: (cm) 0.157 Volume: (cm) 0.016 % Reduction in Area: 95.7% % Reduction in Volume: 95.6% Epithelialization: Large (67-100%) Tunneling: No Undermining: No Wound Description Classification: Grade 2 Wound Margin: Distinct, outline attached Exudate Amount: Medium Exudate Type: Serosanguineous Exudate Color: red, brown Foul Odor After Cleansing: No Slough/Fibrino No Wound Bed Granulation Amount: Large (67-100%) Exposed Structure Granulation Quality: Pink, Pale Fascia Exposed: No Necrotic Amount: None Present (0%) Fat Layer (Subcutaneous Tissue) Exposed: Yes Tendon Exposed: No Muscle Exposed: No Joint Exposed: No Bone Exposed: No Treatment Notes Wound #2 (Toe Great) Wound Laterality: Right Cleanser Normal Saline Discharge Instruction: Cleanse the wound with Normal Saline prior to applying a clean dressing using gauze sponges, not tissue or cotton balls. Peri-Wound Care Topical Primary Dressing Promogran Prisma Matrix, 4.34 (sq in) (silver collagen) Discharge Instruction: Moisten collagen with saline or hydrogel Secondary Dressing ALLEVYN Heel 4 1/2in x 5 1/2in / 10.5cm x 13.5cm Discharge Instruction: Apply over primary dressing as directed. Woven Gauze Sponge, Non-Sterile 4x4 in Discharge Instruction: Apply rolled gauze under the toe to aid in keeping the toe from bending causing friction/shear. Woven Gauze Sponges 2x2 in Discharge Instruction: Apply over primary dressing as directed. Secured With Conforming Stretch Gauze Bandage, Sterile 2x75 (in/in) Discharge Instruction: Secure with stretch gauze as directed. 30M Medipore H Soft Cloth Surgical T ape, 4  x 10 (in/yd) Discharge Instruction: Secure with tape as directed. Compression Wrap Compression Stockings Add-Ons Electronic Signature(s) Signed: 02/21/2022 4:36:02 PM By: Sharyn Creamer RN, BSN Entered By: Sharyn Creamer on 02/21/2022  08:38:31 -------------------------------------------------------------------------------- Vitals Details Patient Name: Date of Service: Donna [name] 02/21/2022 8:15 A M Medical Record Number: [id number] Patient Account Number: [id number] Date of Birth/Sex: Treating RN: [birth year] (56 y.o. Donna [name] Primary Care Suezette Lafave: Donna [name] Other Clinician: Referring Zafirah Vanzee: Treating Donavan Kerlin/Extender: Donna [name] in Treatment: 10 Vital Signs Time Taken: 08:23 Temperature (F): 98.3 Height (in): 66 Pulse (bpm): 73 Weight (lbs): 220 Respiratory Rate (breaths/min): 18 Body Mass Index (BMI): 35.5 Blood Pressure (mmHg): 113/73 Reference Range: 80 - 120 mg / dl Electronic Signature(s) Signed: 02/21/2022 4:36:02 PM By: Sharyn Creamer RN, BSN Entered By: Sharyn Creamer on 02/21/2022 08:24:10

## 2022-02-22 ENCOUNTER — Other Ambulatory Visit: Payer: Self-pay

## 2022-02-28 ENCOUNTER — Encounter (HOSPITAL_BASED_OUTPATIENT_CLINIC_OR_DEPARTMENT_OTHER): Payer: 59 | Admitting: Physician Assistant

## 2022-02-28 DIAGNOSIS — L97512 Non-pressure chronic ulcer of other part of right foot with fat layer exposed: Secondary | ICD-10-CM | POA: Diagnosis not present

## 2022-02-28 DIAGNOSIS — I10 Essential (primary) hypertension: Secondary | ICD-10-CM | POA: Diagnosis not present

## 2022-02-28 DIAGNOSIS — L97812 Non-pressure chronic ulcer of other part of right lower leg with fat layer exposed: Secondary | ICD-10-CM | POA: Diagnosis not present

## 2022-02-28 DIAGNOSIS — E1142 Type 2 diabetes mellitus with diabetic polyneuropathy: Secondary | ICD-10-CM | POA: Diagnosis not present

## 2022-02-28 DIAGNOSIS — E11621 Type 2 diabetes mellitus with foot ulcer: Secondary | ICD-10-CM | POA: Diagnosis not present

## 2022-02-28 NOTE — Progress Notes (Addendum)
Donna [name] ([id number]) Visit Report for 02/28/2022 Chief Complaint Document Details Patient Name: Date of Service: Donna [name], Donna [name] 02/28/2022 8:15 A M Medical Record Number: [id number] Patient Account Number: [id number] Date of Birth/Sex: Treating RN: May 11, 1966 (56 y.o. [name] Primary Care Provider: Hillard Danker Other Clinician: Referring Provider: Treating Provider/Extender: [name] in Treatment: 11 Information Obtained from: Patient Chief Complaint Right great toe ulcer Electronic Signature(s) Signed: 02/28/2022 8:57:17 AM By: Lenda Kelp PA-C Entered By: Lenda Kelp on 02/28/2022 08:57:17 -------------------------------------------------------------------------------- Debridement Details Patient Name: Date of Service: Donna [name] 02/28/2022 8:15 A M Medical Record Number: [id number] Patient Account Number: [id number] Date of Birth/Sex: Treating RN: 1966-05-20 (56 y.o. [name], [name] Primary Care Provider: Hillard Danker Other Clinician: Referring Provider: Treating Provider/Extender: [name] in Treatment: 11 Debridement Performed for Assessment: Wound #2 Right T Great oe Performed By: Physician Lenda Kelp, PA Debridement Type: Debridement Severity of Tissue Pre Debridement: Fat layer exposed Level of Consciousness (Pre-procedure): Awake and Alert Pre-procedure Verification/Time Out Yes - 09:00 Taken: Start Time: 09:01 Pain Control: Lidocaine 5% topical ointment T Area Debrided (L x W): otal 0.3 (cm) x 0.3 (cm) = 0.09 (cm) Tissue and other material debrided: Viable, Non-Viable, Callus, Subcutaneous Level: Skin/Subcutaneous Tissue Debridement Description: Excisional Instrument: Curette Bleeding: None End Time: 09:06 Procedural Pain: 0 Post Procedural Pain: 0 Response to Treatment: Procedure was tolerated well Level of Consciousness (Post- Awake and  Alert procedure): Post Debridement Measurements of Total Wound Length: (cm) 0.3 Width: (cm) 0.3 Depth: (cm) 0.5 Volume: (cm) 0.035 Character of Wound/Ulcer Post Debridement: Improved Severity of Tissue Post Debridement: Fat layer exposed Post Procedure Diagnosis Same as Pre-procedure Electronic Signature(s) Signed: 02/28/2022 9:23:40 AM By: Lenda Kelp PA-C Signed: 02/28/2022 5:36:22 PM By: Shawn Stall RN, BSN Entered By: Lenda Kelp on 02/28/2022 09:23:40 -------------------------------------------------------------------------------- HPI Details Patient Name: Date of Service: Donna [name] 02/28/2022 8:15 A M Medical Record Number: [id number] Patient Account Number: [id number] Date of Birth/Sex: Treating RN: 1966-02-28 (56 y.o. 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 She tells me that since at least last summer she has had a progressive callused area that has opened closed but is never really healed. She has a history of calluses on her feet. She is a diabetic with neuropathy. She was referred to Dr. Allena Katz at [facility] who felt that this might be a large plantar wart she was treated with laser for 2 treatments and then subsequently with ammonium lactate none of this is apparently made any difference according to the patient. She has a very odd looking area with thick skin and callus and an almost crossed like area of open tissue. She also has erythema above this which she says is chronic. The other interesting part of her recent history is recurrent cellulitis on the right lower leg although this is separated from the area we are looking at. She has had trips to see  infectious disease has been on Keflex for as long as 20 days. This is presumed to be strep. She saw Dr. Renold Don Past medical history includes type 2 diabetes, recurrent cellulitis of the right leg, right Achilles tendon repair a year ago for spontaneous rupture ABI in  our clinic was 1.09 on the right 2/17; patient admitted the clinic last week had a thick callused area on the right lateral heel she is a diabetic with neuropathy. She had thick callus and across like area of open wound. I aggressively debrided this area to remove most of the callus and gave her Hydrofera Blue and heel off loader, heel cup and she has been wearing this religiously trying to keep the pressure off the area. She comes in today with a cross-like area of injury has healed she has a new open area that I do not remember from last time. Still some dry flaking skin and callus but not nearly as much as last week at which time I had to use a scalpel to remove this. 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 She came in 2 weeks ago with a very thick hyperkeratotic callused area on her right lateral heel. There was a "crossed" shaped area of split tissue with an open wound at the bottom. This had previously been treated as a possible wart without improvement. I removed all of the callus when she first came in here. This is mostly epithelialized over still thick skin but nothing like it was. She has 1 small open area that has not healed she has been more religious about offloading the area 10/27/2020 upon evaluation today patient appears to be doing well with regard to her wound although she does still apparently appear to have something still open in the base of the wound. Fortunately there is no evidence of infection at this time which is great news and in general I feel like she is doing decently well though she has a lot of callus buildup she is using the offloading shoe for her heel. At home she does not always  wear this she tells me. Generally she is using flip-flops there. Of note her primary care provider did give her Keflex yesterday for cellulitis of this leg but has nothing to do with the heel location. 3/17; the patient's area actually looks a lot better. There is no open wound here. Much less callus. She was given a prescription for 40% urea cream I am not sure that really is required this week. She is going to require less friction on this area. I think she is going to need a different type of shoe going forward Readmission: 12-13-2028 upon evaluation today patient presents for reevaluation in the clinic she was last seen by Dr. Leanord Hawking on 11-03-2020 when she was discharged this was due to a crack on her heel. Currently she is actually having an issue with her great toe on the right foot. She tells me that this has been present currently for a couple of weeks since around November 22, 2021. She has had a lot of callus over this area in general but not an open wound until that time. Subsequently this has more recently opened and has Become a greater concern she is also noted some odor which is disconcerting to her. She tells me that her greatest concern is that "she does not lose this toe". Again I completely understand her concern in this regard I definitely think we need to be aggressive in treating this and try to get things better. She is very Adult nurse. Patient does have a history of diabetes mellitus type 2 with diabetic neuropathy. She also tells me that she does have a history as well of hypertension which is pretty well controlled. She has not  yet had an x-ray of the area and has not been on any antibiotics. Both are things I think we will need to do today. 12-20-2021 upon evaluation today patient appears to be doing well with regard to her toe ulcer all things considered. She has been tolerating the dressing changes without complication. I do have her x-ray as well as her culture for review today  the culture did so Staphylococcus aureus no MRSA noted which was great news susceptible to doxycycline which is what I did place her on last week. Overall the odor that she was noticing has also cleared which is great news. With regard to the x-ray there does not appear to be any signs of osteomyelitis or bone erosion at this point which is also great news in general and very pleased with where things stand. 12-27-2021 upon evaluation today patient appears to be doing well with regard to her wound this is actually measuring significantly better which is great news. Overall I am extremely pleased with where we stand today. No fevers, chills, nausea, vomiting, or diarrhea. 01-03-2022 upon evaluation today patient's wound is actually showing signs of significant improvement which is also news. I am very pleased with what we are seeing there is a little bit of callus buildup still but we are going to work on that today otherwise she seems to be making excellent progress which is great news. 01-10-2022 upon evaluation today patient appears to be doing well currently in regard to her toe ulcer. She has been tolerating the dressing changes without complication. Fortunately there does not appear to be any evidence of active infection locally or systemically which is great news. No fevers, chills, nausea, vomiting, or diarrhea. 01-17-2022 upon evaluation today patient's toe was actually doing about the same this week there is not a tremendous improvement in size but overall the appearance of the wound is better it is much flatter I do think the [facility] has done well in that regard. We will still wait to hear back on the skin substitute. With that being said I do believe that the patient could benefit from again the skin substitute to try to help get this to heal much more effectively and quickly in the meantime I am trying to help flatten this out which I think is doing a pretty good job.. 6/7; wound on the  plantar right great toe over the distal phalanx. Wound is slightly smaller but looks healthier there is rims of epithelialization she still has some callus on the toe indicative of inadequate pressure/friction relief. I talked to her about making sure in the forefoot offloading boot that the weight stays on her heel in stance phase 01-31-2022 upon evaluation patient's wound is still continuing to show signs of really minimal progress unfortunately. I actually believe that she may need to go into the total contact cast in order to get this to heal and I discussed that with her today as well. Obviously this has been a little bit of a barrier for her not because she does not want to do it and does not want to get healed but rather because it is going to prevent her from being able to drive which is obviously going to be a significant quality-of-life issue with what she does in the position she is in at work. 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 I do believe she could work with the cast on  but the issue simply is that she cannot drive to get to work. Otherwise should be able to walk and do what she needs to do. 02-07-2022 upon evaluation today patient appears to be doing well with regard to her wounds. She has been tolerating the dressing changes without complication. Fortunately I do not see any signs of infection although the wound itself is not significantly smaller as of yet. This is something that we are trying to work on I do believe a total contact cast would be the right way to go. I discussed this with the patient today as well. She is actually in agreement with giving this a trial although she is a little nervous about it being that she is claustrophobic. 6/23; patient presents for obligatory cast exchange. She reports that her heel feels sore from the total contact cast. On that she had no issues. 02-14-2022 upon evaluation today  patient's wound is actually showing signs of excellent improvement. There does not appear to be any signs of active infection at this time and overall the toe looks amazing in just 1 week's time. We have come down almost half compared to where we were before size wise and this is also him as well. Overall I think that the casting has done extremely good for her and the toe in general looks significantly improved. I think this week since we want to have to mess with the wound bed itself just the callus around she will likely have even better improvement come next time. 02-21-2022 upon evaluation today patient appears to be doing well with regard to her toe. She is actually showing signs of improvement this is a little bit slower than she would like to see but nonetheless we are seeing significant changes in the right direction in my opinion. Fortunately she is also developing a lot less callus on the end of the toe which is helpful for her as well in my opinion. 02-28-2022 upon evaluation today patient appears to be doing well currently in regard to her wound. She has been tolerating the dressing changes. Fortunately I do not see any signs of infection I think the cast is doing an awesome job here. No fevers, chills, nausea, vomiting, or diarrhea. Electronic Signature(s) Signed: 02/28/2022 9:20:06 AM By: Lenda Kelp PA-C Entered By: Lenda Kelp on 02/28/2022 09:20:06 -------------------------------------------------------------------------------- Physical Exam Details Patient Name: Date of Service: KALIFA, CADDEN 02/28/2022 8:15 A M Medical Record Number: [id number] Patient Account Number: [id number] Date of Birth/Sex: Treating RN: [birth year] (56 y.o. [name] Primary Care Provider: Hillard Danker Other Clinician: Referring Provider: Treating Provider/Extender: [name] in Treatment: 11 Constitutional Well-nourished and well-hydrated in no  acute distress. Respiratory normal breathing without difficulty. Psychiatric this patient is able to make decisions and demonstrates good insight into disease process. Alert and Oriented x 3. pleasant and cooperative. Notes Upon inspection patient's wound bed actually showed signs of good granulation and epithelization at this point. Fortunately I do not see any evidence of infection locally or systemically which is great news and overall I am extremely pleased with where things stand today. Electronic Signature(s) Signed: 02/28/2022 9:20:22 AM By: Lenda Kelp PA-C Entered By: Lenda Kelp on 02/28/2022 09:20:22 -------------------------------------------------------------------------------- Physician Orders Details Patient Name: Date of Service: KAISHA, WACHOB 02/28/2022 8:15 A M Medical Record Number: [id number] Patient Account Number: [id number] Date of Birth/Sex: Treating RN: 1966-04-20 (56 y.o. [name] Primary Care Provider: Hillard Danker Other Clinician: Referring  Provider: Treating Provider/Extender: [name] in Treatment: 11 Verbal / Phone Orders: No Diagnosis Coding ICD-10 Coding Code Description E11.621 Type 2 diabetes mellitus with foot ulcer L97.812 Non-pressure chronic ulcer of other part of right lower leg with fat layer exposed E11.42 Type 2 diabetes mellitus with diabetic polyneuropathy I10 Essential (primary) hypertension Follow-up Appointments ppointment in 1 week. Allen Derry, PA and [place], Room 8 Wednesday 03/07/2022 0815 Return A ppointment in 2 weeks. Allen Derry, PA and [place], Room 8 Wednesday 03/14/2022 0815 Return A Cellular or Tissue Based Products Wound #2 Right T Great oe Cellular or Tissue Based Product Type: - 01/10/2022 Epifix run insurance authorization 01/17/2022-pending 6/7/223 pending 01/31/2022 approved with high copay. Bathing/ Shower/ Hygiene May shower with protection but do not  get wound dressing(s) wet. - May purchase cast protector from [facility], [facility], or [employer] Edema Control - Lymphedema / SCD / Other Elevate legs to the level of the heart or above for 30 minutes daily and/or when sitting, a frequency of: - 3-4 times a day throughout the day. Avoid standing for long periods of time. Off-Loading Total Contact Cast to Right Lower Extremity - pad the anterior lower leg, ankles, and 5th toe. Other: - this week try the defender boot use for walking and standing. Wound Treatment Wound #2 - T Great oe Wound Laterality: Right Cleanser: Normal Saline 1 x Per Week/30 Days Discharge Instructions: Cleanse the wound with Normal Saline prior to applying a clean dressing using gauze sponges, not tissue or cotton balls. Prim Dressing: Promogran Prisma Matrix, 4.34 (sq in) (silver collagen) 1 x Per Week/30 Days ary Discharge Instructions: Moisten collagen with saline or hydrogel Secondary Dressing: ALLEVYN Heel 4 1/2in x 5 1/2in / 10.5cm x 13.5cm 1 x Per Week/30 Days Discharge Instructions: Apply over primary dressing as directed. Secondary Dressing: Woven Gauze Sponge, Non-Sterile 4x4 in 1 x Per Week/30 Days Discharge Instructions: Apply rolled gauze under the toe to aid in keeping the toe from bending causing friction/shear. Secondary Dressing: Woven Gauze Sponges 2x2 in (Generic) 1 x Per Week/30 Days Discharge Instructions: Apply over primary dressing as directed. Secured With: Insurance underwriter, Sterile 2x75 (in/in) (Generic) 1 x Per Week/30 Days Discharge Instructions: Secure with stretch gauze as directed. Secured With: 65M Medipore H Soft Cloth Surgical T ape, 4 x 10 (in/yd) (Generic) 1 x Per Week/30 Days Discharge Instructions: Secure with tape as directed. Electronic Signature(s) Signed: 02/28/2022 4:33:23 PM By: Lenda Kelp PA-C Signed: 02/28/2022 5:36:22 PM By: Shawn Stall RN, BSN Signed: 02/28/2022 5:36:22 PM By: Shawn Stall RN, BSN Entered By:  Shawn Stall on 02/28/2022 09:08:26 -------------------------------------------------------------------------------- Problem List Details Patient Name: Date of Service: ARTICE, HOLOHAN 02/28/2022 8:15 A M Medical Record Number: [id number] Patient Account Number: [id number] Date of Birth/Sex: Treating RN: Jan 15, 1966 (56 y.o. [name], [name] Primary Care Provider: Hillard Danker Other Clinician: Referring Provider: Treating Provider/Extender: [name] in Treatment: 11 Active Problems ICD-10 Encounter Code Description Active Date MDM Diagnosis E11.621 Type 2 diabetes mellitus with foot ulcer 12/13/2021 No Yes L97.812 Non-pressure chronic ulcer of other part of right lower leg with fat layer 12/13/2021 No Yes exposed E11.42 Type 2 diabetes mellitus with diabetic polyneuropathy 12/13/2021 No Yes I10 Essential (primary) hypertension 12/13/2021 No Yes Inactive Problems Resolved Problems Electronic Signature(s) Signed: 02/28/2022 8:57:11 AM By: Lenda Kelp PA-C Entered By: Lenda Kelp on 02/28/2022 08:57:10 -------------------------------------------------------------------------------- Progress Note Details Patient Name: Date of Service: Cleora Fleet  R. 02/28/2022 8:15 A M Medical Record Number: [id number] Patient Account Number: [id number] Date of Birth/Sex: Treating RN: 09/07/1965 (56 y.o. 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 She tells me that since at least last summer she has had a progressive callused area that has opened closed but is never really healed. She  has a history of calluses on her feet. She is a diabetic with neuropathy. She was referred to Dr. Allena KatzPatel at [facility] who felt that this might be a large plantar wart she was treated with laser for 2 treatments and then subsequently with ammonium lactate none of this is apparently made any difference according to the patient. She has a very odd looking area with thick skin and callus and an almost crossed like area of open tissue. She also has erythema above this which she says is chronic. The other interesting part of her recent history is recurrent cellulitis on the right lower leg although this is separated from the area we are looking at. She has had trips to see infectious disease has been on Keflex for as long as 20 days. This is presumed to be strep. She saw Dr. Renold DonVu Past medical history includes type 2 diabetes, recurrent cellulitis of the right leg, right Achilles tendon repair a year ago for spontaneous rupture ABI in our clinic was 1.09 on the right 2/17; patient admitted the clinic last week had a thick callused area on the right lateral heel she is a diabetic with neuropathy. She had thick callus and across like area of open wound. I aggressively debrided this area to remove most of the callus and gave her Hydrofera Blue and heel off loader, heel cup and she has been wearing this religiously trying to keep the pressure off the area. She comes in today with a cross-like area of injury has healed she has a new open area that I do not remember from last time. Still some dry flaking skin and callus but not nearly as much as last week at which time I had to use a scalpel to remove this. 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 She came in 2 weeks ago with a very thick hyperkeratotic callused area on her right lateral heel. There was a "crossed" shaped area of split tissue with an open wound at the bottom. This had previously been treated as a possible wart  without improvement. I removed all of the callus when she first came in here. This is mostly epithelialized over still thick skin but nothing like it was. She has 1 small open area that has not healed she has been more religious about offloading the area 10/27/2020 upon evaluation today patient appears to be doing well with regard to her wound although she does still apparently appear to have something still open in the base of the wound. Fortunately there is no evidence of infection at this time which is great news and in general I feel like she is doing decently well though she has a lot of callus buildup she is using the offloading shoe for her heel. At home she does not always wear this she tells me. Generally she is using flip-flops there.  Of note her primary care provider did give her Keflex yesterday for cellulitis of this leg but has nothing to do with the heel location. 3/17; the patient's area actually looks a lot better. There is no open wound here. Much less callus. She was given a prescription for 40% urea cream I am not sure that really is required this week. She is going to require less friction on this area. I think she is going to need a different type of shoe going forward Readmission: 12-13-2028 upon evaluation today patient presents for reevaluation in the clinic she was last seen by Dr. Leanord Hawking on 11-03-2020 when she was discharged this was due to a crack on her heel. Currently she is actually having an issue with her great toe on the right foot. She tells me that this has been present currently for a couple of weeks since around November 22, 2021. She has had a lot of callus over this area in general but not an open wound until that time. Subsequently this has more recently opened and has Become a greater concern she is also noted some odor which is disconcerting to her. She tells me that her greatest concern is that "she does not lose this toe". Again I completely understand her concern  in this regard I definitely think we need to be aggressive in treating this and try to get things better. She is very Adult nurse. Patient does have a history of diabetes mellitus type 2 with diabetic neuropathy. She also tells me that she does have a history as well of hypertension which is pretty well controlled. She has not yet had an x-ray of the area and has not been on any antibiotics. Both are things I think we will need to do today. 12-20-2021 upon evaluation today patient appears to be doing well with regard to her toe ulcer all things considered. She has been tolerating the dressing changes without complication. I do have her x-ray as well as her culture for review today the culture did so Staphylococcus aureus no MRSA noted which was great news susceptible to doxycycline which is what I did place her on last week. Overall the odor that she was noticing has also cleared which is great news. With regard to the x-ray there does not appear to be any signs of osteomyelitis or bone erosion at this point which is also great news in general and very pleased with where things stand. 12-27-2021 upon evaluation today patient appears to be doing well with regard to her wound this is actually measuring significantly better which is great news. Overall I am extremely pleased with where we stand today. No fevers, chills, nausea, vomiting, or diarrhea. 01-03-2022 upon evaluation today patient's wound is actually showing signs of significant improvement which is also news. I am very pleased with what we are seeing there is a little bit of callus buildup still but we are going to work on that today otherwise she seems to be making excellent progress which is great news. 01-10-2022 upon evaluation today patient appears to be doing well currently in regard to her toe ulcer. She has been tolerating the dressing changes without complication. Fortunately there does not appear to be any evidence of active infection  locally or systemically which is great news. No fevers, chills, nausea, vomiting, or diarrhea. 01-17-2022 upon evaluation today patient's toe was actually doing about the same this week there is not a tremendous improvement in size but overall the appearance of the wound is  better it is much flatter I do think the [facility] has done well in that regard. We will still wait to hear back on the skin substitute. With that being said I do believe that the patient could benefit from again the skin substitute to try to help get this to heal much more effectively and quickly in the meantime I am trying to help flatten this out which I think is doing a pretty good job.. 6/7; wound on the plantar right great toe over the distal phalanx. Wound is slightly smaller but looks healthier there is rims of epithelialization she still has some callus on the toe indicative of inadequate pressure/friction relief. I talked to her about making sure in the forefoot offloading boot that the weight stays on her heel in stance phase 01-31-2022 upon evaluation patient's wound is still continuing to show signs of really minimal progress unfortunately. I actually believe that she may need to go into the total contact cast in order to get this to heal and I discussed that with her today as well. Obviously this has been a little bit of a barrier for her not because she does not want to do it and does not want to get healed but rather because it is going to prevent her from being able to drive which is obviously going to be a significant quality-of-life issue with what she does in the position she is in at work. 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 I do believe she could work with the cast on but the issue simply is that she cannot drive to get to work. Otherwise should be able to walk and do what she needs to do. 02-07-2022 upon evaluation today patient appears  to be doing well with regard to her wounds. She has been tolerating the dressing changes without complication. Fortunately I do not see any signs of infection although the wound itself is not significantly smaller as of yet. This is something that we are trying to work on I do believe a total contact cast would be the right way to go. I discussed this with the patient today as well. She is actually in agreement with giving this a trial although she is a little nervous about it being that she is claustrophobic. 6/23; patient presents for obligatory cast exchange. She reports that her heel feels sore from the total contact cast. On that she had no issues. 02-14-2022 upon evaluation today patient's wound is actually showing signs of excellent improvement. There does not appear to be any signs of active infection at this time and overall the toe looks amazing in just 1 week's time. We have come down almost half compared to where we were before size wise and this is also him as well. Overall I think that the casting has done extremely good for her and the toe in general looks significantly improved. I think this week since we want to have to mess with the wound bed itself just the callus around she will likely have even better improvement come next time. 02-21-2022 upon evaluation today patient appears to be doing well with regard to her toe. She is actually showing signs of improvement this is a little bit slower than she would like to see but nonetheless we are seeing significant changes in the right direction in my opinion. Fortunately she is also developing a lot less callus on the end of the  toe which is helpful for her as well in my opinion. 02-28-2022 upon evaluation today patient appears to be doing well currently in regard to her wound. She has been tolerating the dressing changes. Fortunately I do not see any signs of infection I think the cast is doing an awesome job here. No fevers, chills, nausea,  vomiting, or diarrhea. Objective Constitutional Well-nourished and well-hydrated in no acute distress. Vitals Time Taken: 8:36 AM, Height: 66 in, Weight: 220 lbs, BMI: 35.5, Temperature: 97.8 F, Pulse: 66 bpm, Respiratory Rate: 18 breaths/min, Blood Pressure: 128/79 mmHg. Respiratory normal breathing without difficulty. Psychiatric this patient is able to make decisions and demonstrates good insight into disease process. Alert and Oriented x 3. pleasant and cooperative. General Notes: Upon inspection patient's wound bed actually showed signs of good granulation and epithelization at this point. Fortunately I do not see any evidence of infection locally or systemically which is great news and overall I am extremely pleased with where things stand today. Integumentary (Hair, Skin) Wound #2 status is Open. Original cause of wound was Blister. The date acquired was: 11/22/2021. The wound has been in treatment 11 weeks. The wound is located on the Right T Great. The wound measures 0.2cm length x 0.3cm width x 0.2cm depth; 0.047cm^2 area and 0.009cm^3 volume. There is Fat Layer oe (Subcutaneous Tissue) exposed. There is no tunneling or undermining noted. There is a medium amount of serosanguineous drainage noted. The wound margin is distinct with the outline attached to the wound base. There is large (67-100%) pink, pale granulation within the wound bed. There is no necrotic tissue within the wound bed. 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 There was a Excisional Skin/Subcutaneous Tissue  Debridement with a total area of 0.09 sq cm performed by Lenda Kelp, PA. With the following instrument(s): Curette to remove Viable and Non-Viable tissue/material. Material removed includes Callus and Subcutaneous Tissue and after achieving pain control using Lidocaine 5% topical ointment. A time out was conducted at 09:00, prior to the start of the procedure. There was no bleeding. The procedure was tolerated well with a pain level of 0 throughout and a pain level of 0 following the procedure. Post Debridement Measurements: 0.3cm length x 0.3cm width x 0.5cm depth; 0.035cm^3 volume. Character of Wound/Ulcer Post Debridement is improved. Severity of Tissue Post Debridement is: Fat layer exposed. Post procedure Diagnosis Wound #2: Same as Pre-Procedure Pre-procedure diagnosis of Wound #2 is a Diabetic Wound/Ulcer of the Lower Extremity located on the Right T Great . There was a T Contact Cast oe otal Procedure by Lenda Kelp, PA. Post procedure Diagnosis Wound #2: Same as Pre-Procedure Plan Follow-up Appointments: Return Appointment in 1 week. Allen Derry, PA and [place], Room 8 Wednesday 03/07/2022 0815 Return Appointment in 2 weeks. Allen Derry, PA and [place], Room 8 Wednesday 03/14/2022 0815 Cellular or Tissue Based Products: Wound #2 Right T Great: oe Cellular or Tissue Based Product Type: - 01/10/2022 Epifix run insurance authorization 01/17/2022-pending 6/7/223 pending 01/31/2022 approved with high copay. Bathing/ Shower/ Hygiene: May shower with protection but do not get wound dressing(s) wet. - May purchase cast protector from  [facility], [facility], or [employer] Edema Control - Lymphedema / SCD / Other: Elevate legs to the level of the heart or above for 30 minutes daily and/or when sitting, a frequency of: - 3-4 times a day throughout the day. Avoid standing for long periods of time. Off-Loading: T Contact Cast to Right Lower Extremity - pad the anterior lower leg, ankles, and 5th  toe. otal Other: - this week try the defender boot use for walking and standing. WOUND #2: - T Great Wound Laterality: Right oe Cleanser: Normal Saline 1 x Per Week/30 Days Discharge Instructions: Cleanse the wound with Normal Saline prior to applying a clean dressing using gauze sponges, not tissue or cotton balls. Prim Dressing: Promogran Prisma Matrix, 4.34 (sq in) (silver collagen) 1 x Per Week/30 Days ary Discharge Instructions: Moisten collagen with saline or hydrogel Secondary Dressing: ALLEVYN Heel 4 1/2in x 5 1/2in / 10.5cm x 13.5cm 1 x Per Week/30 Days Discharge Instructions: Apply over primary dressing as directed. Secondary Dressing: Woven Gauze Sponge, Non-Sterile 4x4 in 1 x Per Week/30 Days Discharge Instructions: Apply rolled gauze under the toe to aid in keeping the toe from bending causing friction/shear. Secondary Dressing: Woven Gauze Sponges 2x2 in (Generic) 1 x Per Week/30 Days Discharge Instructions: Apply over primary dressing as directed. Secured With: Insurance underwriter, Sterile 2x75 (in/in) (Generic) 1 x Per Week/30 Days Discharge Instructions: Secure with stretch gauze as directed. Secured With: 34M Medipore H Soft Cloth Surgical T ape, 4 x 10 (in/yd) (Generic) 1 x Per Week/30 Days Discharge Instructions: Secure with tape as directed. 1. I recommend currently that we go ahead and continue with the wound care measures as before and the patient is in agreement with plan. This includes the use of the silver collagen which I do believe is doing quite well. 2. I am also can recommend that we have the patient continue with the gauze to cover which I think is doing a good job at helping with the padding as well. 3. I am also going to continue with the total contact casting. We will see patient back for reevaluation in 1 week here in the clinic. If anything worsens or changes patient will contact our office for additional recommendations. Electronic  Signature(s) Signed: 02/28/2022 9:24:00 AM By: Lenda Kelp PA-C Previous Signature: 02/28/2022 9:23:11 AM Version By: Lenda Kelp PA-C Entered By: Lenda Kelp on 02/28/2022 09:24:00 -------------------------------------------------------------------------------- Total Contact Cast Details Patient Name: Date of Service: BEYA, TIPPS 02/28/2022 8:15 A M Medical Record Number: [id number] Patient Account Number: [id number] Date of Birth/Sex: Treating RN: [birth year] (56 y.o. [name] Primary Care Provider: Hillard Danker Other Clinician: Referring Provider: Treating Provider/Extender: [name] in Treatment: 11 T Contact Cast Applied for Wound Assessment: otal Wound #2 Right T Great oe Performed By: Physician Lenda Kelp, PA Post Procedure Diagnosis Same as Pre-procedure Electronic Signature(s) Signed: 02/28/2022 4:33:23 PM By: Lenda Kelp PA-C Signed: 02/28/2022 5:36:22 PM By: Shawn Stall RN, BSN Entered By: Shawn Stall on 02/28/2022 09:06:49 -------------------------------------------------------------------------------- SuperBill Details Patient Name: Date of Service: Donna [name] 02/28/2022 Medical Record Number: [id number] Patient Account Number: [id number] Date of Birth/Sex: Treating RN: [birth year] (56 y.o. [name] Primary Care Provider: Hillard Danker Other Clinician: Referring Provider: Treating Provider/Extender: [name] in Treatment: 11 Diagnosis Coding ICD-10 Codes Code Description E11.621 Type 2 diabetes mellitus with foot ulcer L97.812 Non-pressure chronic ulcer of other part of right lower leg  with fat layer exposed E11.42 Type 2 diabetes mellitus with diabetic polyneuropathy I10 Essential (primary) hypertension Facility Procedures CPT4 Code: 16109604 Description: 11042 - DEB SUBQ TISSUE 20 SQ CM/< ICD-10 Diagnosis Description L97.812  Non-pressure chronic ulcer of other part of right lower leg with fat layer exp Modifier: osed Quantity: 1 Physician Procedures : CPT4 Code Description Modifier [id number] 11042 - WC PHYS SUBQ TISS 20 SQ CM ICD-10 Diagnosis Description L97.812 Non-pressure chronic ulcer of other part of right lower leg with fat layer exposed Quantity: 1 Electronic Signature(s) Signed: 02/28/2022 9:24:12 AM By: Lenda Kelp PA-C Entered By: Lenda Kelp on 02/28/2022 09:24:12

## 2022-03-01 NOTE — Progress Notes (Signed)
DARE, SPILLMAN ([id number]) Visit Report for 02/28/2022 Arrival Information Details Patient Name: Date of Service: Donna [name], Donna [name] 02/28/2022 8:15 A M Medical Record Number: [id number] Patient Account Number: [id number] Date of Birth/Sex: Treating RN: [birth year] (56 y.o. [name], [name] Primary Care Aram Domzalski: Pricilla Holm Other Clinician: Referring Loretha Ure: Treating Cira Deyoe/Extender: Dreama Saa in Treatment: 11 Visit Information History Since Last Visit Added or deleted any medications: No Patient Arrived: Ambulatory Any new allergies or adverse reactions: No Arrival Time: 08:33 Had a fall or experienced change in No Accompanied By: self activities of daily living that may affect Transfer Assistance: None risk of falls: Patient Identification Verified: Yes Signs or symptoms of abuse/neglect since last No Secondary Verification Process Completed: Yes visito Patient Requires Transmission-Based Precautions: No Hospitalized since last visit: No Patient Has Alerts: No Implantable device outside of the clinic No excluding cellular tissue based products placed in the center since last visit: Has Dressing in Place as Prescribed: Yes Has Footwear/Offloading in Place as Yes Prescribed: Right: Removable Cast Walker/Walking Boot Pain Present Now: No Electronic Signature(s) Signed: 03/01/2022 4:37:01 PM By: Erenest Blank Entered By: Erenest Blank on 02/28/2022 08:34:06 -------------------------------------------------------------------------------- Encounter Discharge Information Details Patient Name: Date of Service: Donna [name]. 02/28/2022 8:15 A M Medical Record Number: [id number] Patient Account Number: [id number] Date of Birth/Sex: Treating RN: [birth year] (56 y.o. [name] Primary Care Donesha Wallander: Pricilla Holm Other Clinician: Referring Quinisha Mould: Treating Jamieon Lannen/Extender: Dreama Saa in Treatment: 11 Encounter Discharge Information Items Post Procedure Vitals Discharge Condition: Stable Temperature (F): 97.8 Ambulatory Status: Ambulatory Pulse (bpm): 66 Discharge Destination: Home Respiratory Rate (breaths/min): 18 Transportation: Private Auto Blood Pressure (mmHg): 128/79 Accompanied By: self Schedule Follow-up Appointment: Yes Clinical Summary of Care: Electronic Signature(s) Signed: 02/28/2022 5:36:22 PM By: Deon Pilling RN, BSN Entered By: Deon Pilling on 02/28/2022 09:10:06 -------------------------------------------------------------------------------- Lower Extremity Assessment Details Patient Name: Date of Service: Donna [name], Donna [name] 02/28/2022 8:15 A M Medical Record Number: [id number] Patient Account Number: [id number] Date of Birth/Sex: Treating RN: March 16, 1966 (56 y.o. [name], [name] Primary Care Shenouda Genova: Pricilla Holm Other Clinician: Referring Kerry Odonohue: Treating Zyia Kaneko/Extender: Dreama Saa in Treatment: 11 Edema Assessment Assessed: [Left: No] [Right: No] Edema: [Left: N] [Right: o] Calf Left: Right: Point of Measurement: 28 cm From Medial Instep 35.5 cm Ankle Left: Right: Point of Measurement: 8 cm From Medial Instep 22 cm Electronic Signature(s) Signed: 02/28/2022 5:36:22 PM By: Deon Pilling RN, BSN Signed: 03/01/2022 4:37:01 PM By: Erenest Blank Entered By: Erenest Blank on 02/28/2022 08:53:39 -------------------------------------------------------------------------------- Multi-Disciplinary Care Plan Details Patient Name: Date of Service: Donna [name]. 02/28/2022 8:15 A M Medical Record Number: [id number] Patient Account Number: [id number] Date of Birth/Sex: Treating RN: [birth year] (56 y.o. [name], [name] Primary Care Clearence Vitug: Pricilla Holm Other Clinician: Referring Shabria Egley: Treating Iain Sawchuk/Extender: Dreama Saa in  Treatment: 11 Active Inactive Pain, Acute or Chronic Nursing Diagnoses: Pain, acute or chronic: actual or potential Potential alteration in comfort, pain Goals: Patient will verbalize adequate pain control and receive pain control interventions during procedures as needed Date Initiated: 12/13/2021 Target Resolution Date: 03/16/2022 Goal Status: Active Patient/caregiver will verbalize comfort level met Date Initiated: 12/13/2021 Target Resolution Date: 03/16/2022 Goal Status: Active Interventions: Encourage patient to take pain medications as prescribed Provide education on pain management Reposition patient for comfort Treatment Activities: Administer pain control measures as ordered : 12/13/2021 Notes: Wound/Skin Impairment Nursing Diagnoses: Knowledge deficit related to ulceration/compromised skin integrity Goals: Patient/caregiver  will verbalize understanding of skin care regimen Date Initiated: 12/13/2021 Target Resolution Date: 03/16/2022 Goal Status: Active Interventions: Assess patient/caregiver ability to obtain necessary supplies Assess patient/caregiver ability to perform ulcer/skin care regimen upon admission and as needed Provide education on ulcer and skin care Treatment Activities: Skin care regimen initiated : 12/13/2021 Topical wound management initiated : 12/13/2021 Notes: Electronic Signature(s) Signed: 02/28/2022 5:36:22 PM By: Deon Pilling RN, BSN Entered By: Deon Pilling on 02/28/2022 09:05:28 -------------------------------------------------------------------------------- Pain Assessment Details Patient Name: Date of Service: Donna [name] 02/28/2022 8:15 A M Medical Record Number: [id number] Patient Account Number: [id number] Date of Birth/Sex: Treating RN: [birth year] (56 y.o. [name] Primary Care Narcisa Ganesh: Pricilla Holm Other Clinician: Referring Suhayla Chisom: Treating Javayah Magaw/Extender: Dreama Saa in  Treatment: 11 Active Problems Location of Pain Severity and Description of Pain Patient Has Paino No Site Locations Pain Management and Medication Current Pain Management: Electronic Signature(s) Signed: 02/28/2022 5:36:22 PM By: Deon Pilling RN, BSN Signed: 03/01/2022 4:37:01 PM By: Erenest Blank Entered By: Erenest Blank on 02/28/2022 08:37:13 -------------------------------------------------------------------------------- Patient/Caregiver Education Details Patient Name: Date of Service: Donna [name] 7/12/2023andnbsp8:15 [place] Record Number: [id number] Patient Account Number: [id number] Date of Birth/Gender: Treating RN: [birth year] (56 y.o. [name] Primary Care Physician: Pricilla Holm Other Clinician: Referring Physician: Treating Physician/Extender: Dreama Saa in Treatment: 11 Education Assessment Education Provided To: Patient Education Topics Provided Wound/Skin Impairment: Handouts: Skin Care Do's and Dont's Methods: Explain/Verbal Responses: Reinforcements needed Electronic Signature(s) Signed: 02/28/2022 5:36:22 PM By: Deon Pilling RN, BSN Entered By: Deon Pilling on 02/28/2022 09:05:44 -------------------------------------------------------------------------------- Wound Assessment Details Patient Name: Date of Service: Donna [name] 02/28/2022 8:15 A M Medical Record Number: [id number] Patient Account Number: [id number] Date of Birth/Sex: Treating RN: January 24, 1966 (56 y.o. [name], Meta.Reding Primary Care Meeah Totino: Pricilla Holm Other Clinician: Referring Rubylee Zamarripa: Treating Adolpho Meenach/Extender: Dreama Saa in Treatment: 11 Wound Status Wound Number: 2 Primary Etiology: Diabetic Wound/Ulcer of the Lower Extremity Wound Location: Right T Great oe Wound Status: Open Wounding Event: Blister Comorbid History: Hypertension, Type II Diabetes, Neuropathy Date  Acquired: 11/22/2021 Weeks Of Treatment: 11 Clustered Wound: No Photos Wound Measurements Length: (cm) 0.2 Width: (cm) 0.3 Depth: (cm) 0.2 Area: (cm) 0.047 Volume: (cm) 0.009 % Reduction in Area: 98.7% % Reduction in Volume: 97.5% Epithelialization: Large (67-100%) Tunneling: No Undermining: No Wound Description Classification: Grade 2 Wound Margin: Distinct, outline attached Exudate Amount: Medium Exudate Type: Serosanguineous Exudate Color: red, brown Foul Odor After Cleansing: No Slough/Fibrino No Wound Bed Granulation Amount: Large (67-100%) Exposed Structure Granulation Quality: Pink, Pale Fascia Exposed: No Necrotic Amount: None Present (0%) Fat Layer (Subcutaneous Tissue) Exposed: Yes Tendon Exposed: No Muscle Exposed: No Joint Exposed: No Bone Exposed: No Treatment Notes Wound #2 (Toe Great) Wound Laterality: Right Cleanser Normal Saline Discharge Instruction: Cleanse the wound with Normal Saline prior to applying a clean dressing using gauze sponges, not tissue or cotton balls. Peri-Wound Care Topical Primary Dressing Promogran Prisma Matrix, 4.34 (sq in) (silver collagen) Discharge Instruction: Moisten collagen with saline or hydrogel Secondary Dressing ALLEVYN Heel 4 1/2in x 5 1/2in / 10.5cm x 13.5cm Discharge Instruction: Apply over primary dressing as directed. Woven Gauze Sponge, Non-Sterile 4x4 in Discharge Instruction: Apply rolled gauze under the toe to aid in keeping the toe from bending causing friction/shear. Woven Gauze Sponges 2x2 in Discharge Instruction: Apply over primary dressing as directed. Secured With Conforming Stretch Gauze Bandage, Sterile 2x75 (in/in) Discharge Instruction: Secure with  stretch gauze as directed. 48M Medipore H Soft Cloth Surgical T ape, 4 x 10 (in/yd) Discharge Instruction: Secure with tape as directed. Compression Wrap Compression Stockings Add-Ons Electronic Signature(s) Signed: 02/28/2022 5:36:22 PM By:  Deon Pilling RN, BSN Signed: 03/01/2022 4:37:01 PM By: Erenest Blank Entered By: Erenest Blank on 02/28/2022 08:54:44 -------------------------------------------------------------------------------- Vitals Details Patient Name: Date of Service: Donna [name]. 02/28/2022 8:15 A M Medical Record Number: [id number] Patient Account Number: [id number] Date of Birth/Sex: Treating RN: 1965/09/12 (56 y.o. [name], [name] Primary Care Khiara Shuping: Pricilla Holm Other Clinician: Referring Perri Lamagna: Treating Makaila Windle/Extender: Dreama Saa in Treatment: 11 Vital Signs Time Taken: 08:36 Temperature (F): 97.8 Height (in): 66 Pulse (bpm): 66 Weight (lbs): 220 Respiratory Rate (breaths/min): 18 Body Mass Index (BMI): 35.5 Blood Pressure (mmHg): 128/79 Reference Range: 80 - 120 mg / dl Electronic Signature(s) Signed: 03/01/2022 4:37:01 PM By: Erenest Blank Entered By: Erenest Blank on 02/28/2022 08:37:06

## 2022-03-05 ENCOUNTER — Other Ambulatory Visit (HOSPITAL_COMMUNITY): Payer: Self-pay

## 2022-03-07 ENCOUNTER — Encounter (HOSPITAL_BASED_OUTPATIENT_CLINIC_OR_DEPARTMENT_OTHER): Payer: 59 | Admitting: Physician Assistant

## 2022-03-07 DIAGNOSIS — L97812 Non-pressure chronic ulcer of other part of right lower leg with fat layer exposed: Secondary | ICD-10-CM | POA: Diagnosis not present

## 2022-03-07 DIAGNOSIS — E11621 Type 2 diabetes mellitus with foot ulcer: Secondary | ICD-10-CM | POA: Diagnosis not present

## 2022-03-07 DIAGNOSIS — L97512 Non-pressure chronic ulcer of other part of right foot with fat layer exposed: Secondary | ICD-10-CM | POA: Diagnosis not present

## 2022-03-07 DIAGNOSIS — E1142 Type 2 diabetes mellitus with diabetic polyneuropathy: Secondary | ICD-10-CM | POA: Diagnosis not present

## 2022-03-07 DIAGNOSIS — I10 Essential (primary) hypertension: Secondary | ICD-10-CM | POA: Diagnosis not present

## 2022-03-07 NOTE — Progress Notes (Addendum)
BEREA, MAJKOWSKI ([id number]) Visit Report for 03/07/2022 Chief Complaint Document Details Patient Name: Date of Service: Donna [name], Donna [name] 03/07/2022 8:15 A M Medical Record Number: [id number] Patient Account Number: [id number] Date of Birth/Sex: Treating RN: [birth year] (56 y.o. [name] Primary Care Provider: Hillard Danker Other Clinician: Referring Provider: Treating Provider/Extender: [name] in Treatment: 12 Information Obtained from: Patient Chief Complaint Right great toe ulcer Electronic Signature(s) Signed: 03/07/2022 8:25:45 AM By: Lenda Kelp PA-C Entered By: Lenda Kelp on 03/07/2022 08:25:45 -------------------------------------------------------------------------------- Debridement Details Patient Name: Date of Service: Donna [name], Donna [name] 03/07/2022 8:15 A M Medical Record Number: [id number] Patient Account Number: [id number] Date of Birth/Sex: Treating RN: 1965-12-26 (56 y.o. [name], [name] Primary Care Provider: Hillard Danker Other Clinician: Referring Provider: Treating Provider/Extender: [name] in Treatment: 12 Debridement Performed for Assessment: Wound #2 Right T Great oe Performed By: Physician Lenda Kelp, PA Debridement Type: Debridement Severity of Tissue Pre Debridement: Fat layer exposed Level of Consciousness (Pre-procedure): Awake and Alert Pre-procedure Verification/Time Out Yes - 08:50 Taken: Start Time: 08:51 Pain Control: Lidocaine 4% T opical Solution T Area Debrided (L x W): otal 2 (cm) x 2 (cm) = 4 (cm) Tissue and other material debrided: Viable, Non-Viable, Callus, Subcutaneous, Skin: Dermis , Skin: Epidermis Level: Skin/Subcutaneous Tissue Debridement Description: Excisional Instrument: Curette Bleeding: Minimum Hemostasis Achieved: Pressure End Time: 08:56 Procedural Pain: 0 Post Procedural Pain: 0 Response to Treatment: Procedure  was tolerated well Level of Consciousness (Post- Awake and Alert procedure): Post Debridement Measurements of Total Wound Length: (cm) 0.3 Width: (cm) 0.3 Depth: (cm) 0.1 Volume: (cm) 0.007 Character of Wound/Ulcer Post Debridement: Improved Severity of Tissue Post Debridement: Fat layer exposed Post Procedure Diagnosis Same as Pre-procedure Electronic Signature(s) Signed: 03/07/2022 3:46:42 PM By: Lenda Kelp PA-C Signed: 03/07/2022 4:20:13 PM By: Shawn Stall RN, BSN Entered By: Shawn Stall on 03/07/2022 08:56:58 -------------------------------------------------------------------------------- HPI Details Patient Name: Date of Service: Donna [name] 03/07/2022 8:15 A M Medical Record Number: [id number] Patient Account Number: [id number] Date of Birth/Sex: Treating RN: 1965-09-06 (56 y.o. 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 She tells me that since at least last summer she has had a progressive callused area that has opened closed but is never really healed. She has a history of calluses on her feet. She is a diabetic with neuropathy. She was referred to Dr. Allena Katz at [facility] who felt that this might be a large plantar wart she was treated with laser for 2 treatments and then subsequently with ammonium lactate none of this is apparently made any difference according to the patient. She has a very odd looking area with thick skin and callus and an almost crossed like area of open tissue. She also has erythema above this which she says is chronic. The other interesting part of her recent history is recurrent cellulitis on the right lower leg although this is separated  from the area we are looking at. She has had trips to see infectious disease has been on Keflex for as long as 20 days. This is presumed to be strep. She saw Dr. Renold Don Past medical history includes type 2 diabetes, recurrent cellulitis of the right leg, right Achilles tendon repair  a year ago for spontaneous rupture ABI in our clinic was 1.09 on the right 2/17; patient admitted the clinic last week had a thick callused area on the right lateral heel she is a diabetic with neuropathy. She had thick callus and across like area of open wound. I aggressively debrided this area to remove most of the callus and gave her Hydrofera Blue and heel off loader, heel cup and she has been wearing this religiously trying to keep the pressure off the area. She comes in today with a cross-like area of injury has healed she has a new open area that I do not remember from last time. Still some dry flaking skin and callus but not nearly as much as last week at which time I had to use a scalpel to remove this. 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 She came in 2 weeks ago with a very thick hyperkeratotic callused area on her right lateral heel. There was a "crossed" shaped area of split tissue with an open wound at the bottom. This had previously been treated as a possible wart without improvement. I removed all of the callus when she first came in here. This is mostly epithelialized over still thick skin but nothing like it was. She has 1 small open area that has not healed she has been more religious about offloading the area 10/27/2020 upon evaluation today patient appears to be doing well with regard to her wound although she does still apparently appear to have something still open in the base of the wound. Fortunately there is no evidence of infection at this time which is great news and in general I feel like she is doing decently well though she has a lot of callus buildup she is using the  offloading shoe for her heel. At home she does not always wear this she tells me. Generally she is using flip-flops there. Of note her primary care provider did give her Keflex yesterday for cellulitis of this leg but has nothing to do with the heel location. 3/17; the patient's area actually looks a lot better. There is no open wound here. Much less callus. She was given a prescription for 40% urea cream I am not sure that really is required this week. She is going to require less friction on this area. I think she is going to need a different type of shoe going forward Readmission: 12-13-2028 upon evaluation today patient presents for reevaluation in the clinic she was last seen by Dr. Leanord Hawking on 11-03-2020 when she was discharged this was due to a crack on her heel. Currently she is actually having an issue with her great toe on the right foot. She tells me that this has been present currently for a couple of weeks since around November 22, 2021. She has had a lot of callus over this area in general but not an open wound until that time. Subsequently this has more recently opened and has Become a greater concern she is also noted some odor which is disconcerting to her. She tells me that her greatest concern is that "she does not lose this toe". Again I completely understand her concern in this regard I definitely think we need to be aggressive in treating this and try to get things better. She is very Adult nurse. Patient does have a history of diabetes mellitus type 2 with diabetic neuropathy. She also tells me that she does have a history as well of hypertension  which is pretty well controlled. She has not yet had an x-ray of the area and has not been on any antibiotics. Both are things I think we will need to do today. 12-20-2021 upon evaluation today patient appears to be doing well with regard to her toe ulcer all things considered. She has been tolerating the dressing changes without complication. I  do have her x-ray as well as her culture for review today the culture did so Staphylococcus aureus no MRSA noted which was great news susceptible to doxycycline which is what I did place her on last week. Overall the odor that she was noticing has also cleared which is great news. With regard to the x-ray there does not appear to be any signs of osteomyelitis or bone erosion at this point which is also great news in general and very pleased with where things stand. 12-27-2021 upon evaluation today patient appears to be doing well with regard to her wound this is actually measuring significantly better which is great news. Overall I am extremely pleased with where we stand today. No fevers, chills, nausea, vomiting, or diarrhea. 01-03-2022 upon evaluation today patient's wound is actually showing signs of significant improvement which is also news. I am very pleased with what we are seeing there is a little bit of callus buildup still but we are going to work on that today otherwise she seems to be making excellent progress which is great news. 01-10-2022 upon evaluation today patient appears to be doing well currently in regard to her toe ulcer. She has been tolerating the dressing changes without complication. Fortunately there does not appear to be any evidence of active infection locally or systemically which is great news. No fevers, chills, nausea, vomiting, or diarrhea. 01-17-2022 upon evaluation today patient's toe was actually doing about the same this week there is not a tremendous improvement in size but overall the appearance of the wound is better it is much flatter I do think the [facility] has done well in that regard. We will still wait to hear back on the skin substitute. With that being said I do believe that the patient could benefit from again the skin substitute to try to help get this to heal much more effectively and quickly in the meantime I am trying to help flatten this out  which I think is doing a pretty good job.. 6/7; wound on the plantar right great toe over the distal phalanx. Wound is slightly smaller but looks healthier there is rims of epithelialization she still has some callus on the toe indicative of inadequate pressure/friction relief. I talked to her about making sure in the forefoot offloading boot that the weight stays on her heel in stance phase 01-31-2022 upon evaluation patient's wound is still continuing to show signs of really minimal progress unfortunately. I actually believe that she may need to go into the total contact cast in order to get this to heal and I discussed that with her today as well. Obviously this has been a little bit of a barrier for her not because she does not want to do it and does not want to get healed but rather because it is going to prevent her from being able to drive which is obviously going to be a significant quality-of-life issue with what she does in the position she is in at work. 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 I do  believe she could work with the cast on but the issue simply is that she cannot drive to get to work. Otherwise should be able to walk and do what she needs to do. 02-07-2022 upon evaluation today patient appears to be doing well with regard to her wounds. She has been tolerating the dressing changes without complication. Fortunately I do not see any signs of infection although the wound itself is not significantly smaller as of yet. This is something that we are trying to work on I do believe a total contact cast would be the right way to go. I discussed this with the patient today as well. She is actually in agreement with giving this a trial although she is a little nervous about it being that she is claustrophobic. 6/23; patient presents for obligatory cast exchange. She reports that her heel feels sore from the total contact  cast. On that she had no issues. 02-14-2022 upon evaluation today patient's wound is actually showing signs of excellent improvement. There does not appear to be any signs of active infection at this time and overall the toe looks amazing in just 1 week's time. We have come down almost half compared to where we were before size wise and this is also him as well. Overall I think that the casting has done extremely good for her and the toe in general looks significantly improved. I think this week since we want to have to mess with the wound bed itself just the callus around she will likely have even better improvement come next time. 02-21-2022 upon evaluation today patient appears to be doing well with regard to her toe. She is actually showing signs of improvement this is a little bit slower than she would like to see but nonetheless we are seeing significant changes in the right direction in my opinion. Fortunately she is also developing a lot less callus on the end of the toe which is helpful for her as well in my opinion. 02-28-2022 upon evaluation today patient appears to be doing well currently in regard to her wound. She has been tolerating the dressing changes. Fortunately I do not see any signs of infection I think the cast is doing an awesome job here. No fevers, chills, nausea, vomiting, or diarrhea. 03-07-2022 upon evaluation today patient appears to be doing well with regard to her wound although the collagen is very stuck and dry on the area on the tip of her toe at this point. Nonetheless I discussed with her that I am a little reluctant to put the cast on this week due to what appeared to been a blister along the left side of her toe laterally. Nonetheless this has been a little worried about the possibility of infection and I do not see anything obvious I think that we may not want to put the cast on this week just to be on the safe side she voiced understanding. With that being said if were  not to use the cast she is going to need to make sure to have still some pressure relief I think making gauze bolster with 3 gauze up underneath the toe is going to do a good job here. We will roll and tape this and then subsequently anchored underneath the big toe should be using the front offloading shoe as well. Electronic Signature(s) Signed: 03/07/2022 2:45:09 PM By: Lenda Kelp PA-C Entered By: Lenda Kelp on 03/07/2022 14:45:09 -------------------------------------------------------------------------------- Physical Exam Details Patient Name: Date of Service:  Donna [name], Donna [name] 03/07/2022 8:15 A M Medical Record Number: [id number] Patient Account Number: [id number] Date of Birth/Sex: Treating RN: 1966-04-30 (56 y.o. [name] Primary Care Provider: Hillard Danker Other Clinician: Referring Provider: Treating Provider/Extender: [name] in Treatment: 12 Constitutional Well-nourished and well-hydrated in no acute distress. Respiratory normal breathing without difficulty. Psychiatric this patient is able to make decisions and demonstrates good insight into disease process. Alert and Oriented x 3. pleasant and cooperative. Notes Upon inspection patient's wound bed actually showed signs of good granulation and epithelization at this point. Fortunately postdebridement she seems to be doing much better which is great news and overall I am extremely pleased in that regard I still think that we might have to go back to the cast the wrap. But if she does a good job with the wedge shoe and the offloading gauze up underneath her toe is a bolster I think then we may be able to get this closed without going back that direction. It so dry right now on the tip of the toe that time that I recommend using Xeroform gauze instead of the collagen. Electronic Signature(s) Signed: 03/07/2022 2:45:43 PM By: Lenda Kelp PA-C Entered By: Lenda Kelp  on 03/07/2022 14:45:43 -------------------------------------------------------------------------------- Physician Orders Details Patient Name: Date of Service: Donna [name], Donna [name] 03/07/2022 8:15 A M Medical Record Number: [id number] Patient Account Number: [id number] Date of Birth/Sex: Treating RN: 1966/03/10 (56 y.o. [name], Millard.Loa Primary Care Provider: Hillard Danker Other Clinician: Referring Provider: Treating Provider/Extender: [name] in Treatment: 12 Verbal / Phone Orders: No Diagnosis Coding ICD-10 Coding Code Description E11.621 Type 2 diabetes mellitus with foot ulcer L97.812 Non-pressure chronic ulcer of other part of right lower leg with fat layer exposed E11.42 Type 2 diabetes mellitus with diabetic polyneuropathy I10 Essential (primary) hypertension Follow-up Appointments ppointment in 1 week. - (Dr. Leanord Hawking covering) Allen [place], [place] and [place], Room 8 Wednesday 03/14/2022 0815 Return A ppointment in 2 weeks. Allen [place], [place] and [place], Room 8 Wednesday 03/21/2022 0900 Return A Cellular or Tissue Based Products Wound #2 Right T Great oe Cellular or Tissue Based Product Type: - 01/10/2022 Epifix run insurance authorization 01/17/2022-pending 6/7/223 pending 01/31/2022 approved with high copay. Bathing/ Shower/ Hygiene May shower with protection but do not get wound dressing(s) wet. - May purchase cast protector from [facility], [facility], or [employer] Edema Control - Lymphedema / SCD / Other Elevate legs to the level of the heart or above for 30 minutes daily and/or when sitting, a frequency of: - 3-4 times a day throughout the day. Avoid standing for long periods of time. Off-Loading Total Contact Cast to Right Lower Extremity - pad the anterior lower leg, ankles, and 5th toe. HOLD THIS WEEK. Wedge shoe to: - right foot while walking and standing. limit walking and standing. Wound Treatment Wound #2 - T Great oe Wound Laterality:  Right Cleanser: Normal Saline 1 x Per Day/30 Days Discharge Instructions: Cleanse the wound with Normal Saline prior to applying a clean dressing using gauze sponges, not tissue or cotton balls. Prim Dressing: Xeroform Occlusive Gauze Dressing, 4x4 in 1 x Per Day/30 Days ary Discharge Instructions: Apply to wound bed as instructed Secondary Dressing: Woven Gauze Sponge, Non-Sterile 4x4 in 1 x Per Day/30 Days Discharge Instructions: Apply x3 rolled gauze under the toe to aid in keeping the toe from bending causing friction/shear. Secondary Dressing: Woven Gauze Sponges 2x2 in (Generic) 1 x Per Day/30 Days Discharge  Instructions: Apply over primary dressing as directed. Secured With: Insurance underwriter, Sterile 2x75 (in/in) (Generic) 1 x Per Day/30 Days Discharge Instructions: Secure with stretch gauze as directed. Secured With: 81M Medipore H Soft Cloth Surgical T ape, 4 x 10 (in/yd) (Generic) 1 x Per Day/30 Days Discharge Instructions: Secure with tape as directed. Electronic Signature(s) Signed: 03/07/2022 3:46:42 PM By: Lenda Kelp PA-C Signed: 03/07/2022 4:20:13 PM By: Shawn Stall RN, BSN Entered By: Shawn Stall on 03/07/2022 09:04:07 -------------------------------------------------------------------------------- Problem List Details Patient Name: Date of Service: Donna [name] 03/07/2022 8:15 A M Medical Record Number: [id number] Patient Account Number: [id number] Date of Birth/Sex: Treating RN: Jul 29, 1966 (56 y.o. [name], [name] Primary Care Provider: Hillard Danker Other Clinician: Referring Provider: Treating Provider/Extender: [name] in Treatment: 12 Active Problems ICD-10 Encounter Code Description Active Date MDM Diagnosis E11.621 Type 2 diabetes mellitus with foot ulcer 12/13/2021 No Yes L97.812 Non-pressure chronic ulcer of other part of right lower leg with fat layer 12/13/2021 No Yes exposed E11.42  Type 2 diabetes mellitus with diabetic polyneuropathy 12/13/2021 No Yes I10 Essential (primary) hypertension 12/13/2021 No Yes Inactive Problems Resolved Problems Electronic Signature(s) Signed: 03/07/2022 8:25:06 AM By: Lenda Kelp PA-C Entered By: Lenda Kelp on 03/07/2022 08:25:06 -------------------------------------------------------------------------------- Progress Note Details Patient Name: Date of Service: Donna [name] 03/07/2022 8:15 A M Medical Record Number: [id number] Patient Account Number: [id number] Date of Birth/Sex: Treating RN: 1965-10-25 (56 y.o. 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 She tells me that since at least last summer she has had a progressive callused area that has opened closed but is never really healed. She has a history of calluses on her feet. She is a diabetic with neuropathy. She was referred to Dr. Allena Katz at [facility] who felt that this might be a large plantar wart she was treated with laser for 2 treatments and then subsequently with ammonium lactate none of this is apparently made any difference according to the patient. She has a very odd looking area with thick skin and callus and an almost crossed like area of open tissue. She also has erythema above this which she says is chronic. The other interesting part of her recent history is recurrent cellulitis on the right lower leg although this is separated from the area we are looking at. She has had trips to see infectious disease has been on Keflex for as long as 20 days. This is presumed to be strep. She saw  Dr. Renold Don Past medical history includes type 2 diabetes, recurrent cellulitis of the right leg, right Achilles tendon repair a year ago for spontaneous rupture ABI in our clinic was 1.09 on the right 2/17; patient admitted the clinic last week had a thick callused area on the right lateral heel she is a diabetic with neuropathy. She had thick callus and across like area of open wound. I aggressively debrided this area to remove most of the callus and gave her Hydrofera Blue and heel off loader, heel cup and she has been wearing this religiously trying to keep the pressure off the area. She comes in today with a cross-like area of injury has healed she has  a new open area that I do not remember from last time. Still some dry flaking skin and callus but not nearly as much as last week at which time I had to use a scalpel to remove this. 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 She came in 2 weeks ago with a very thick hyperkeratotic callused area on her right lateral heel. There was a "crossed" shaped area of split tissue with an open wound at the bottom. This had previously been treated as a possible wart without improvement. I removed all of the callus when she first came in here. This is mostly epithelialized over still thick skin but nothing like it was. She has 1 small open area that has not healed she has been more religious about offloading the area 10/27/2020 upon evaluation today patient appears to be doing well with regard to her wound although she does still apparently appear to have something still open in the base of the wound. Fortunately there is no evidence of infection at this time which is great news and in general I feel like she is doing decently well though she has a lot of callus buildup she is using the offloading shoe for her heel. At home she does not always wear this she tells me. Generally she is using flip-flops there. Of note her primary care provider  did give her Keflex yesterday for cellulitis of this leg but has nothing to do with the heel location. 3/17; the patient's area actually looks a lot better. There is no open wound here. Much less callus. She was given a prescription for 40% urea cream I am not sure that really is required this week. She is going to require less friction on this area. I think she is going to need a different type of shoe going forward Readmission: 12-13-2028 upon evaluation today patient presents for reevaluation in the clinic she was last seen by Dr. Leanord Hawkingobson on 11-03-2020 when she was discharged this was due to a crack on her heel. Currently she is actually having an issue with her great toe on the right foot. She tells me that this has been present currently for a couple of weeks since around November 22, 2021. She has had a lot of callus over this area in general but not an open wound until that time. Subsequently this has more recently opened and has Become a greater concern she is also noted some odor which is disconcerting to her. She tells me that her greatest concern is that "she does not lose this toe". Again I completely understand her concern in this regard I definitely think we need to be aggressive in treating this and try to get things better. She is very Adult nurseappreciative. Patient does have a history of diabetes mellitus type 2 with diabetic neuropathy. She also tells me that she does have a history as well of hypertension which is pretty well controlled. She has not yet had an x-ray of the area and has not been on any antibiotics. Both are things I think we will need to do today. 12-20-2021 upon evaluation today patient appears to be doing well with regard to her toe ulcer all things considered. She has been tolerating the dressing changes without complication. I do have her x-ray as well as her culture for review today the culture did so Staphylococcus aureus no MRSA noted which was great news susceptible to  doxycycline which is what I did place her on last  week. Overall the odor that she was noticing has also cleared which is great news. With regard to the x-ray there does not appear to be any signs of osteomyelitis or bone erosion at this point which is also great news in general and very pleased with where things stand. 12-27-2021 upon evaluation today patient appears to be doing well with regard to her wound this is actually measuring significantly better which is great news. Overall I am extremely pleased with where we stand today. No fevers, chills, nausea, vomiting, or diarrhea. 01-03-2022 upon evaluation today patient's wound is actually showing signs of significant improvement which is also news. I am very pleased with what we are seeing there is a little bit of callus buildup still but we are going to work on that today otherwise she seems to be making excellent progress which is great news. 01-10-2022 upon evaluation today patient appears to be doing well currently in regard to her toe ulcer. She has been tolerating the dressing changes without complication. Fortunately there does not appear to be any evidence of active infection locally or systemically which is great news. No fevers, chills, nausea, vomiting, or diarrhea. 01-17-2022 upon evaluation today patient's toe was actually doing about the same this week there is not a tremendous improvement in size but overall the appearance of the wound is better it is much flatter I do think the [facility] has done well in that regard. We will still wait to hear back on the skin substitute. With that being said I do believe that the patient could benefit from again the skin substitute to try to help get this to heal much more effectively and quickly in the meantime I am trying to help flatten this out which I think is doing a pretty good job.. 6/7; wound on the plantar right great toe over the distal phalanx. Wound is slightly smaller but looks  healthier there is rims of epithelialization she still has some callus on the toe indicative of inadequate pressure/friction relief. I talked to her about making sure in the forefoot offloading boot that the weight stays on her heel in stance phase 01-31-2022 upon evaluation patient's wound is still continuing to show signs of really minimal progress unfortunately. I actually believe that she may need to go into the total contact cast in order to get this to heal and I discussed that with her today as well. Obviously this has been a little bit of a barrier for her not because she does not want to do it and does not want to get healed but rather because it is going to prevent her from being able to drive which is obviously going to be a significant quality-of-life issue with what she does in the position she is in at work. 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 I do believe she could work with the cast on but the issue simply is that she cannot drive to get to work. Otherwise should be able to walk and do what she needs to do. 02-07-2022 upon evaluation today patient appears to be doing well with regard to her wounds. She has been tolerating the dressing changes without complication. Fortunately I do not see any signs of infection although the wound itself is not significantly smaller as of yet. This is something that we are trying to work on I do believe a total contact cast would be the right way  to go. I discussed this with the patient today as well. She is actually in agreement with giving this a trial although she is a little nervous about it being that she is claustrophobic. 6/23; patient presents for obligatory cast exchange. She reports that her heel feels sore from the total contact cast. On that she had no issues. 02-14-2022 upon evaluation today patient's wound is actually showing signs of excellent improvement. There does not  appear to be any signs of active infection at this time and overall the toe looks amazing in just 1 week's time. We have come down almost half compared to where we were before size wise and this is also him as well. Overall I think that the casting has done extremely good for her and the toe in general looks significantly improved. I think this week since we want to have to mess with the wound bed itself just the callus around she will likely have even better improvement come next time. 02-21-2022 upon evaluation today patient appears to be doing well with regard to her toe. She is actually showing signs of improvement this is a little bit slower than she would like to see but nonetheless we are seeing significant changes in the right direction in my opinion. Fortunately she is also developing a lot less callus on the end of the toe which is helpful for her as well in my opinion. 02-28-2022 upon evaluation today patient appears to be doing well currently in regard to her wound. She has been tolerating the dressing changes. Fortunately I do not see any signs of infection I think the cast is doing an awesome job here. No fevers, chills, nausea, vomiting, or diarrhea. 03-07-2022 upon evaluation today patient appears to be doing well with regard to her wound although the collagen is very stuck and dry on the area on the tip of her toe at this point. Nonetheless I discussed with her that I am a little reluctant to put the cast on this week due to what appeared to been a blister along the left side of her toe laterally. Nonetheless this has been a little worried about the possibility of infection and I do not see anything obvious I think that we may not want to put the cast on this week just to be on the safe side she voiced understanding. With that being said if were not to use the cast she is going to need to make sure to have still some pressure relief I think making gauze bolster with 3 gauze up underneath  the toe is going to do a good job here. We will roll and tape this and then subsequently anchored underneath the big toe should be using the front offloading shoe as well. Objective Constitutional Well-nourished and well-hydrated in no acute distress. Vitals Time Taken: 8:18 AM, Height: 66 in, Weight: 220 lbs, BMI: 35.5, Temperature: 98.7 F, Pulse: 76 bpm, Respiratory Rate: 18 breaths/min, Blood Pressure: 128/79 mmHg. Respiratory normal breathing without difficulty. Psychiatric this patient is able to make decisions and demonstrates good insight into disease process. Alert and Oriented x 3. pleasant and cooperative. General Notes: Upon inspection patient's wound bed actually showed signs of good granulation and epithelization at this point. Fortunately postdebridement she seems to be doing much better which is great news and overall I am extremely pleased in that regard I still think that we might have to go back to the cast the wrap. But if she does a good job with the  wedge shoe and the offloading gauze up underneath her toe is a bolster I think then we may be able to get this closed without going back that direction. It so dry right now on the tip of the toe that time that I recommend using Xeroform gauze instead of the collagen. Integumentary (Hair, Skin) Wound #2 status is Open. Original cause of wound was Blister. The date acquired was: 11/22/2021. The wound has been in treatment 12 weeks. The wound is located on the Right T Great. The wound measures 0.1cm length x 0.1cm width x 0.1cm depth; 0.008cm^2 area and 0.001cm^3 volume. There is Fat Layer oe (Subcutaneous Tissue) exposed. There is no tunneling or undermining noted. There is a medium amount of serosanguineous drainage noted. The wound margin is distinct with the outline attached to the wound base. There is large (67-100%) pink, pale granulation within the wound bed. There is no necrotic tissue within the wound  bed. 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 There was a Excisional Skin/Subcutaneous Tissue Debridement with a total area of 4 sq cm performed by Lenda Kelp, PA. With the following instrument(s): Curette to remove Viable and Non-Viable tissue/material. Material removed includes Callus, Subcutaneous Tissue, Skin: Dermis, and Skin: Epidermis after achieving pain control using Lidocaine 4% Topical Solution. A time out was conducted at 08:50, prior to the start of the procedure. A Minimum amount of bleeding was controlled with Pressure. The procedure was tolerated well with a pain level of 0 throughout and a pain level of 0 following the procedure. Post Debridement Measurements: 0.3cm length x 0.3cm width x 0.1cm depth; 0.007cm^3 volume. Character of Wound/Ulcer Post Debridement is improved. Severity of Tissue Post Debridement is: Fat layer exposed. Post procedure Diagnosis Wound #2: Same as Pre-Procedure Plan Follow-up Appointments: Return Appointment in 1 week. - (Dr. Leanord Hawking covering) Allen Derry, PA and [place], Room 8 Wednesday 03/14/2022 0815 Return Appointment in 2 weeks. Allen Derry, PA and [place], Room 8 Wednesday 03/21/2022 0900 Cellular or Tissue Based Products: Wound #2 Right T Great: oe Cellular or Tissue Based Product Type: - 01/10/2022 [employer] run insurance authorization 01/17/2022-pending 6/7/223 pending 01/31/2022 approved with high copay. Bathing/ Shower/ Hygiene: May shower with protection but do not get wound dressing(s) wet. - May purchase cast protector from [facility], [facility], or  [employer] Edema Control - Lymphedema / SCD / Other: Elevate legs to the level of the heart or above for 30 minutes daily and/or when sitting, a frequency of: - 3-4 times a day throughout the day. Avoid standing for long periods of time. Off-Loading: T Contact Cast to Right Lower Extremity - pad the anterior lower leg, ankles, and 5th toe. HOLD THIS WEEK. otal Wedge shoe to: - right foot while walking and standing. limit walking and standing. WOUND #2: - T Great Wound Laterality: Right oe Cleanser: Normal Saline 1 x Per Day/30 Days Discharge Instructions: Cleanse the wound with Normal Saline prior to applying a clean dressing using gauze sponges, not tissue or cotton balls. Prim Dressing: Xeroform Occlusive Gauze Dressing, 4x4 in 1 x Per Day/30 Days ary Discharge Instructions: Apply to wound bed as instructed Secondary Dressing: Woven Gauze  Sponge, Non-Sterile 4x4 in 1 x Per Day/30 Days Discharge Instructions: Apply x3 rolled gauze under the toe to aid in keeping the toe from bending causing friction/shear. Secondary Dressing: Woven Gauze Sponges 2x2 in (Generic) 1 x Per Day/30 Days Discharge Instructions: Apply over primary dressing as directed. Secured With: Insurance underwriter, Sterile 2x75 (in/in) (Generic) 1 x Per Day/30 Days Discharge Instructions: Secure with stretch gauze as directed. Secured With: 34M Medipore H Soft Cloth Surgical T ape, 4 x 10 (in/yd) (Generic) 1 x Per Day/30 Days Discharge Instructions: Secure with tape as directed. 1. I would recommend currently that we put the cast on hold this week as I want to make sure that there is no issues with what we saw in this blistering on the medial aspect of her toe. 2. I am going to recommend that we initiate treatment with Xeroform gauze as the collagen was getting stuck, hoping that this will actually do better. We will have to see how things look next week. 3. She is currently using a front offloading shoe and I think  if she takes it easy and continues to use this with a gauze bolster underneath which were taken 3 gauze folded them in half and then rolling them into a bolster to take together to put under the toe we should do quite well here. We will see patient back for reevaluation in 1 week here in the clinic. If anything worsens or changes patient will contact our office for additional recommendations. Electronic Signature(s) Signed: 03/07/2022 2:46:58 PM By: Lenda Kelp PA-C Entered By: Lenda Kelp on 03/07/2022 14:46:58 -------------------------------------------------------------------------------- SuperBill Details Patient Name: Date of Service: Donna [name] 03/07/2022 Medical Record Number: [id number] Patient Account Number: [id number] Date of Birth/Sex: Treating RN: [birth year] (56 y.o. [name], [place]oa Primary Care Provider: Hillard Danker Other Clinician: Referring Provider: Treating Provider/Extender: [name] in Treatment: 12 Diagnosis Coding ICD-10 Codes Code Description [id number] Type 2 diabetes mellitus with foot ulcer L97.812 Non-pressure chronic ulcer of other part of right lower leg with fat layer exposed E11.42 Type 2 diabetes mellitus with diabetic polyneuropathy I10 Essential (primary) hypertension Facility Procedures CPT4 Code: [id number] Description: 11042 - DEB SUBQ TISSUE 20 SQ CM/< ICD-10 Diagnosis Description L97.812 Non-pressure chronic ulcer of other part of right lower leg with fat layer expo Modifier: sed Quantity: 1 Physician Procedures : CPT4 Code Description Modifier [id number] 11042 - WC PHYS SUBQ TISS 20 SQ CM ICD-10 Diagnosis Description L97.812 Non-pressure chronic ulcer of other part of right lower leg with fat layer exposed Quantity: 1 Electronic Signature(s) Signed: 03/07/2022 2:47:06 PM By: Lenda Kelp PA-C Entered By: Lenda Kelp on 03/07/2022 14:47:06

## 2022-03-07 NOTE — Progress Notes (Signed)
MIRRA, BASILIO ([id number]) Visit Report for 03/07/2022 Arrival Information Details Patient Name: Date of Service: Donna [name], Donna [name] 03/07/2022 8:15 A M Medical Record Number: [id number] Patient Account Number: [id number] Date of Birth/Sex: Treating RN: 07-24-1966 (56 y.o. [name], [name] Primary Care Flora Ratz: Pricilla Holm Other Clinician: Referring Jove Beyl: Treating Cesario Weidinger/Extender: Dreama Saa in Treatment: 12 Visit Information History Since Last Visit Added or deleted any medications: No Patient Arrived: Ambulatory Any new allergies or adverse reactions: No Arrival Time: 08:17 Had a fall or experienced change in No Accompanied By: self activities of daily living that may affect Transfer Assistance: None risk of falls: Patient Identification Verified: Yes Signs or symptoms of abuse/neglect since last No Secondary Verification Process Completed: Yes visito Patient Requires Transmission-Based Precautions: No Hospitalized since last visit: No Patient Has Alerts: No Implantable device outside of the clinic No excluding cellular tissue based products placed in the center since last visit: Has Dressing in Place as Prescribed: Yes Has Footwear/Offloading in Place as Yes Prescribed: Right: Removable Cast Walker/Walking Boot Pain Present Now: No Electronic Signature(s) Signed: 03/07/2022 3:44:56 PM By: Erenest Blank Entered By: Erenest Blank on 03/07/2022 08:18:10 -------------------------------------------------------------------------------- Encounter Discharge Information Details Patient Name: Date of Service: Donna [name]. 03/07/2022 8:15 A M Medical Record Number: [id number] Patient Account Number: [id number] Date of Birth/Sex: Treating RN: [birth year] (56 y.o. [name] Primary Care Leandrea Ackley: Pricilla Holm Other Clinician: Referring Shaaron Golliday: Treating Kindell Strada/Extender: Dreama Saa in Treatment: 12 Encounter Discharge Information Items Post Procedure Vitals Discharge Condition: Stable Temperature (F): 98.7 Ambulatory Status: Ambulatory Pulse (bpm): 76 Discharge Destination: Home Respiratory Rate (breaths/min): 20 Transportation: Private Auto Blood Pressure (mmHg): 128/79 Accompanied By: self Schedule Follow-up Appointment: Yes Clinical Summary of Care: Electronic Signature(s) Signed: 03/07/2022 4:20:13 PM By: Deon Pilling RN, BSN Entered By: Deon Pilling on 03/07/2022 09:04:59 -------------------------------------------------------------------------------- Lower Extremity Assessment Details Patient Name: Date of Service: Donna [name], Donna [name] 03/07/2022 8:15 A M Medical Record Number: [id number] Patient Account Number: [id number] Date of Birth/Sex: Treating RN: [birth year] (56 y.o. [name], [name] Primary Care Laura Caldas: Pricilla Holm Other Clinician: Referring Enyla Lisbon: Treating Liev Brockbank/Extender: Dreama Saa in Treatment: 12 Edema Assessment Assessed: [Left: No] [Right: No] Edema: [Left: N] [Right: o] Calf Left: Right: Point of Measurement: 28 cm From Medial Instep 35.8 cm Ankle Left: Right: Point of Measurement: 8 cm From Medial Instep 22.6 cm Electronic Signature(s) Signed: 03/07/2022 3:44:56 PM By: Erenest Blank Signed: 03/07/2022 4:20:13 PM By: Deon Pilling RN, BSN Entered By: Erenest Blank on 03/07/2022 08:36:28 -------------------------------------------------------------------------------- Multi-Disciplinary Care Plan Details Patient Name: Date of Service: Donna [name]. 03/07/2022 8:15 A M Medical Record Number: [id number] Patient Account Number: [id number] Date of Birth/Sex: Treating RN: 08-12-1966 (56 y.o. [name], [name] Primary Care Jara Feider: Pricilla Holm Other Clinician: Referring Yasheka Fossett: Treating Ruhani Umland/Extender: Dreama Saa in  Treatment: 12 Active Inactive Pain, Acute or Chronic Nursing Diagnoses: Pain, acute or chronic: actual or potential Potential alteration in comfort, pain Goals: Patient will verbalize adequate pain control and receive pain control interventions during procedures as needed Date Initiated: 12/13/2021 Target Resolution Date: 03/16/2022 Goal Status: Active Patient/caregiver will verbalize comfort level met Date Initiated: 12/13/2021 Target Resolution Date: 03/16/2022 Goal Status: Active Interventions: Encourage patient to take pain medications as prescribed Provide education on pain management Reposition patient for comfort Treatment Activities: Administer pain control measures as ordered : 12/13/2021 Notes: Wound/Skin Impairment Nursing Diagnoses: Knowledge deficit related to ulceration/compromised skin integrity Goals: Patient/caregiver  will verbalize understanding of skin care regimen Date Initiated: 12/13/2021 Target Resolution Date: 03/16/2022 Goal Status: Active Interventions: Assess patient/caregiver ability to obtain necessary supplies Assess patient/caregiver ability to perform ulcer/skin care regimen upon admission and as needed Provide education on ulcer and skin care Treatment Activities: Skin care regimen initiated : 12/13/2021 Topical wound management initiated : 12/13/2021 Notes: Electronic Signature(s) Signed: 03/07/2022 4:20:13 PM By: Deon Pilling RN, BSN Entered By: Deon Pilling on 03/07/2022 08:51:11 -------------------------------------------------------------------------------- Pain Assessment Details Patient Name: Date of Service: Donna [name] 03/07/2022 8:15 A M Medical Record Number: [id number] Patient Account Number: [id number] Date of Birth/Sex: Treating RN: 23-Apr-1966 (56 y.o. [name] Primary Care Kohana Amble: Pricilla Holm Other Clinician: Referring Hikari Tripp: Treating Brogen Duell/Extender: Dreama Saa in  Treatment: 12 Active Problems Location of Pain Severity and Description of Pain Patient Has Paino No Site Locations Pain Management and Medication Current Pain Management: Electronic Signature(s) Signed: 03/07/2022 3:44:56 PM By: Erenest Blank Signed: 03/07/2022 4:20:13 PM By: Deon Pilling RN, BSN Entered By: Erenest Blank on 03/07/2022 08:18:55 -------------------------------------------------------------------------------- Patient/Caregiver Education Details Patient Name: Date of Service: Donna [name] 7/19/2023andnbsp8:15 [place] Record Number: [id number] Patient Account Number: [id number] Date of Birth/Gender: Treating RN: 07/10/1966 (56 y.o. [name] Primary Care Physician: Pricilla Holm Other Clinician: Referring Physician: Treating Physician/Extender: Dreama Saa in Treatment: 12 Education Assessment Education Provided To: Patient Education Topics Provided Wound/Skin Impairment: Handouts: Skin Care Do's and Dont's Methods: Explain/Verbal Responses: Reinforcements needed Electronic Signature(s) Signed: 03/07/2022 4:20:13 PM By: Deon Pilling RN, BSN Entered By: Deon Pilling on 03/07/2022 08:51:24 -------------------------------------------------------------------------------- Wound Assessment Details Patient Name: Date of Service: Donna [name] 03/07/2022 8:15 A M Medical Record Number: [id number] Patient Account Number: [id number] Date of Birth/Sex: Treating RN: 1965/12/16 (56 y.o. [name], Meta.Reding Primary Care Murtaza Shell: Pricilla Holm Other Clinician: Referring Tell Rozelle: Treating Leia Coletti/Extender: Dreama Saa in Treatment: 12 Wound Status Wound Number: 2 Primary Etiology: Diabetic Wound/Ulcer of the Lower Extremity Wound Location: Right T Great oe Wound Status: Open Wounding Event: Blister Comorbid History: Hypertension, Type II Diabetes, Neuropathy Date  Acquired: 11/22/2021 Weeks Of Treatment: 12 Clustered Wound: No Photos Wound Measurements Length: (cm) 0.1 Width: (cm) 0.1 Depth: (cm) 0.1 Area: (cm) 0.008 Volume: (cm) 0.001 % Reduction in Area: 99.8% % Reduction in Volume: 99.7% Epithelialization: Large (67-100%) Tunneling: No Undermining: No Wound Description Classification: Grade 2 Wound Margin: Distinct, outline attached Exudate Amount: Medium Exudate Type: Serosanguineous Exudate Color: red, brown Foul Odor After Cleansing: No Slough/Fibrino No Wound Bed Granulation Amount: Large (67-100%) Exposed Structure Granulation Quality: Pink, Pale Fascia Exposed: No Necrotic Amount: None Present (0%) Fat Layer (Subcutaneous Tissue) Exposed: Yes Tendon Exposed: No Muscle Exposed: No Joint Exposed: No Bone Exposed: No Treatment Notes Wound #2 (Toe Great) Wound Laterality: Right Cleanser Normal Saline Discharge Instruction: Cleanse the wound with Normal Saline prior to applying a clean dressing using gauze sponges, not tissue or cotton balls. Peri-Wound Care Topical Primary Dressing Xeroform Occlusive Gauze Dressing, 4x4 in Discharge Instruction: Apply to wound bed as instructed Secondary Dressing Woven Gauze Sponge, Non-Sterile 4x4 in Discharge Instruction: Apply x3 rolled gauze under the toe to aid in keeping the toe from bending causing friction/shear. Woven Gauze Sponges 2x2 in Discharge Instruction: Apply over primary dressing as directed. Secured With Conforming Stretch Gauze Bandage, Sterile 2x75 (in/in) Discharge Instruction: Secure with stretch gauze as directed. 1M Medipore H Soft Cloth Surgical T ape, 4 x 10 (in/yd) Discharge Instruction: Secure with  tape as directed. Compression Wrap Compression Stockings Add-Ons Electronic Signature(s) Signed: 03/07/2022 3:44:56 PM By: Erenest Blank Signed: 03/07/2022 4:20:13 PM By: Deon Pilling RN, BSN Entered By: Erenest Blank on 03/07/2022  08:41:46 -------------------------------------------------------------------------------- Vitals Details Patient Name: Date of Service: Donna [name]. 03/07/2022 8:15 A M Medical Record Number: [id number] Patient Account Number: [id number] Date of Birth/Sex: Treating RN: [birth year] (56 y.o. [name], [name] Primary Care Kelvin Burpee: Pricilla Holm Other Clinician: Referring Kashawna Manzer: Treating Valon Glasscock/Extender: Dreama Saa in Treatment: 12 Vital Signs Time Taken: 08:18 Temperature (F): 98.7 Height (in): 66 Pulse (bpm): 76 Weight (lbs): 220 Respiratory Rate (breaths/min): 18 Body Mass Index (BMI): 35.5 Blood Pressure (mmHg): 128/79 Reference Range: 80 - 120 mg / dl Electronic Signature(s) Signed: 03/07/2022 3:44:56 PM By: Erenest Blank Entered By: Erenest Blank on 03/07/2022 08:18:43

## 2022-03-09 ENCOUNTER — Other Ambulatory Visit: Payer: Self-pay | Admitting: Internal Medicine

## 2022-03-09 DIAGNOSIS — Z1231 Encounter for screening mammogram for malignant neoplasm of breast: Secondary | ICD-10-CM

## 2022-03-14 ENCOUNTER — Other Ambulatory Visit (HOSPITAL_COMMUNITY): Payer: Self-pay

## 2022-03-14 ENCOUNTER — Encounter (HOSPITAL_BASED_OUTPATIENT_CLINIC_OR_DEPARTMENT_OTHER): Payer: 59 | Admitting: Internal Medicine

## 2022-03-14 ENCOUNTER — Ambulatory Visit
Admission: RE | Admit: 2022-03-14 | Discharge: 2022-03-14 | Disposition: A | Discharge status: Home / Self Care | Payer: 59 | Source: Ambulatory Visit

## 2022-03-14 DIAGNOSIS — E1142 Type 2 diabetes mellitus with diabetic polyneuropathy: Secondary | ICD-10-CM | POA: Diagnosis not present

## 2022-03-14 DIAGNOSIS — I10 Essential (primary) hypertension: Secondary | ICD-10-CM | POA: Diagnosis not present

## 2022-03-14 DIAGNOSIS — Z1231 Encounter for screening mammogram for malignant neoplasm of breast: Secondary | ICD-10-CM

## 2022-03-14 DIAGNOSIS — L97812 Non-pressure chronic ulcer of other part of right lower leg with fat layer exposed: Secondary | ICD-10-CM | POA: Diagnosis not present

## 2022-03-14 DIAGNOSIS — E11621 Type 2 diabetes mellitus with foot ulcer: Secondary | ICD-10-CM | POA: Diagnosis not present

## 2022-03-14 DIAGNOSIS — L97511 Non-pressure chronic ulcer of other part of right foot limited to breakdown of skin: Secondary | ICD-10-CM | POA: Diagnosis not present

## 2022-03-14 NOTE — Progress Notes (Signed)
Donna [name] ([id number]) Visit Report for 03/14/2022 Debridement Details Patient Name: Date of Service: Donna [name] 03/14/2022 8:15 A M Medical Record Number: [id number] Patient Account Number: [id number] Date of Birth/Sex: Treating RN: August 12, 1966 (56 y.o. F) Primary Care Provider: Hillard Danker Other Clinician: Referring Provider: Treating Provider/Extender: Shara Blazing in Treatment: 13 Debridement Performed for Assessment: Wound #2 Right T Great oe Performed By: Physician Maxwell Caul., MD Debridement Type: Debridement Severity of Tissue Pre Debridement: Limited to breakdown of skin Level of Consciousness (Pre-procedure): Awake and Alert Pre-procedure Verification/Time Out Yes - 08:25 Taken: Start Time: 08:26 Pain Control: Lidocaine 4% T opical Solution T Area Debrided (L x W): otal 0.3 (cm) x 0.3 (cm) = 0.09 (cm) Tissue and other material debrided: Viable, Non-Viable, Callus, Skin: Epidermis Level: Skin/Epidermis Debridement Description: Selective/Open Wound Instrument: Blade, Forceps Bleeding: Minimum Hemostasis Achieved: Pressure End Time: 08:28 Procedural Pain: 0 Post Procedural Pain: 0 Response to Treatment: Procedure was tolerated well Level of Consciousness (Post- Awake and Alert procedure): Post Debridement Measurements of Total Wound Length: (cm) 0.1 Width: (cm) 0.1 Depth: (cm) 0.1 Volume: (cm) 0.001 Character of Wound/Ulcer Post Debridement: Improved Severity of Tissue Post Debridement: Limited to breakdown of skin Post Procedure Diagnosis Same as Pre-procedure Electronic Signature(s) Signed: 03/14/2022 4:38:07 PM By: Baltazar Najjar MD Entered By: Baltazar Najjar on 03/14/2022 08:35:45 -------------------------------------------------------------------------------- HPI Details Patient Name: Date of Service: Donna [name] 03/14/2022 8:15 A M Medical Record Number: [id number] Patient Account  Number: [id number] Date of Birth/Sex: Treating RN: May 17, 1966 (56 y.o. 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 She tells me that since at least last summer she has had a progressive callused area that has opened closed but is never really healed. She has a history of calluses on her feet. She is a diabetic with neuropathy. She was referred to Dr. Allena Katz at [facility] who felt that this might be a large plantar wart she was treated with laser for 2 treatments and then subsequently with ammonium lactate none of this is apparently made any difference according to the patient. She has a very odd looking area with thick skin and callus and an almost crossed like area of open tissue. She also has erythema above this which she says is chronic. The other interesting part of her recent history is recurrent cellulitis on the right lower leg although this is separated from the area we are looking at. She has had trips to see infectious disease has been on Keflex for as long as 20 days. This is presumed to be strep. She saw Dr. Renold Don Past medical history includes type 2 diabetes, recurrent cellulitis of the right leg, right Achilles tendon repair a year ago for spontaneous rupture ABI in our clinic was 1.09 on the right 2/17; patient admitted the clinic last week had a thick callused area on the right lateral heel she is a diabetic with neuropathy. She had thick callus and across like area of open wound. I aggressively debrided this area to remove most of the callus and gave her Hydrofera Blue and heel off loader, heel cup and she has been wearing this religiously trying to keep the pressure off the area. She comes in today  with a cross-like area of injury has healed she has  a new open area that I do not remember from last time. Still some dry flaking skin and callus but not nearly as much as last week at which time I had to use a scalpel to remove this. 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 She came in 2 weeks ago with a very thick hyperkeratotic callused area on her right lateral heel. There was a "crossed" shaped area of split tissue with an open wound at the bottom. This had previously been treated as a possible wart without improvement. I removed all of the callus when she first came in here. This is mostly epithelialized over still thick skin but nothing like it was. She has 1 small open area that has not healed she has been more religious about offloading the area 10/27/2020 upon evaluation today patient appears to be doing well with regard to her wound although she does still apparently appear to have something still open in the base of the wound. Fortunately there is no evidence of infection at this time which is great news and in general I feel like she is doing decently well though she has a lot of callus buildup she is using the offloading shoe for her heel. At home she does not always wear this she tells me. Generally she is using flip-flops there. Of note her primary care provider did give her Keflex yesterday for cellulitis of this leg but has nothing to do with the heel location. 3/17; the patient's area actually looks a lot better. There is no open wound here. Much less callus. She was given a prescription for 40% urea cream I am not sure that really is required this week. She is going to require less friction on this area. I think she is going to need a different type of shoe going forward Readmission: 12-13-2028 upon evaluation today patient presents for reevaluation in the clinic she was last seen by Dr. Leanord Hawking on 11-03-2020 when she was discharged this was due to a crack on  her heel. Currently she is actually having an issue with her great toe on the right foot. She tells me that this has been present currently for a couple of weeks since around November 22, 2021. She has had a lot of callus over this area in general but not an open wound until that time. Subsequently this has more recently opened and has Become a greater concern she is also noted some odor which is disconcerting to her. She tells me that her greatest concern is that "she does not lose this toe". Again I completely understand her concern in this regard I definitely think we need to be aggressive in treating this and try to get things better. She is very Adult nurse. Patient does have a history of diabetes mellitus type 2 with diabetic neuropathy. She also tells me that she does have a history as well of hypertension which is pretty well controlled. She has not yet had an x-ray of the area and has not been on any antibiotics. Both are things I think we will need to do today. 12-20-2021 upon evaluation today patient appears to be doing well with regard to her toe ulcer all things considered. She has been tolerating the dressing changes without complication. I do have her x-ray as well as her culture for review today the culture did so Staphylococcus aureus no MRSA noted which was great news susceptible to doxycycline which is what I did place her on last week.  Overall the odor that she was noticing has also cleared which is great news. With regard to the x-ray there does not appear to be any signs of osteomyelitis or bone erosion at this point which is also great news in general and very pleased with where things stand. 12-27-2021 upon evaluation today patient appears to be doing well with regard to her wound this is actually measuring significantly better which is great news. Overall I am extremely pleased with where we stand today. No fevers, chills, nausea, vomiting, or diarrhea. 01-03-2022 upon evaluation  today patient's wound is actually showing signs of significant improvement which is also news. I am very pleased with what we are seeing there is a little bit of callus buildup still but we are going to work on that today otherwise she seems to be making excellent progress which is great news. 01-10-2022 upon evaluation today patient appears to be doing well currently in regard to her toe ulcer. She has been tolerating the dressing changes without complication. Fortunately there does not appear to be any evidence of active infection locally or systemically which is great news. No fevers, chills, nausea, vomiting, or diarrhea. 01-17-2022 upon evaluation today patient's toe was actually doing about the same this week there is not a tremendous improvement in size but overall the appearance of the wound is better it is much flatter I do think the [facility] has done well in that regard. We will still wait to hear back on the skin substitute. With that being said I do believe that the patient could benefit from again the skin substitute to try to help get this to heal much more effectively and quickly in the meantime I am trying to help flatten this out which I think is doing a pretty good job.. 6/7; wound on the plantar right great toe over the distal phalanx. Wound is slightly smaller but looks healthier there is rims of epithelialization she still has some callus on the toe indicative of inadequate pressure/friction relief. I talked to her about making sure in the forefoot offloading boot that the weight stays on her heel in stance phase 01-31-2022 upon evaluation patient's wound is still continuing to show signs of really minimal progress unfortunately. I actually believe that she may need to go into the total contact cast in order to get this to heal and I discussed that with her today as well. Obviously this has been a little bit of a barrier for her not because she does not want to do it and does  not want to get healed but rather because it is going to prevent her from being able to drive which is obviously going to be a significant quality-of-life issue with what she does in the position she is in at work. 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 I do believe she could work with the cast on but the issue simply is that she cannot drive to get to work. Otherwise should be able to walk and do what she needs to do. 02-07-2022 upon evaluation today patient appears to be doing well with regard to her wounds. She has been tolerating the dressing changes without complication. Fortunately I do not see any signs of infection although the wound itself is not significantly smaller as of yet. This is something that we are trying to work on I do believe a total contact cast would be the right way  to go. I discussed this with the patient today as well. She is actually in agreement with giving this a trial although she is a little nervous about it being that she is claustrophobic. 6/23; patient presents for obligatory cast exchange. She reports that her heel feels sore from the total contact cast. On that she had no issues. 02-14-2022 upon evaluation today patient's wound is actually showing signs of excellent improvement. There does not appear to be any signs of active infection at this time and overall the toe looks amazing in just 1 week's time. We have come down almost half compared to where we were before size wise and this is also him as well. Overall I think that the casting has done extremely good for her and the toe in general looks significantly improved. I think this week since we want to have to mess with the wound bed itself just the callus around she will likely have even better improvement come next time. 02-21-2022 upon evaluation today patient appears to be doing well with regard to her toe. She is actually showing signs of  improvement this is a little bit slower than she would like to see but nonetheless we are seeing significant changes in the right direction in my opinion. Fortunately she is also developing a lot less callus on the end of the toe which is helpful for her as well in my opinion. 02-28-2022 upon evaluation today patient appears to be doing well currently in regard to her wound. She has been tolerating the dressing changes. Fortunately I do not see any signs of infection I think the cast is doing an awesome job here. No fevers, chills, nausea, vomiting, or diarrhea. 03-07-2022 upon evaluation today patient appears to be doing well with regard to her wound although the collagen is very stuck and dry on the area on the tip of her toe at this point. Nonetheless I discussed with her that I am a little reluctant to put the cast on this week due to what appeared to been a blister along the left side of her toe laterally. Nonetheless this has been a little worried about the possibility of infection and I do not see anything obvious I think that we may not want to put the cast on this week just to be on the safe side she voiced understanding. With that being said if were not to use the cast she is going to need to make sure to have still some pressure relief I think making gauze bolster with 3 gauze up underneath the toe is going to do a good job here. We will roll and tape this and then subsequently anchored underneath the big toe should be using the front offloading shoe as well. 7/26; patient I know from a previous stay in this clinic. Type II diabetic with a wound on her right plantar great toe. She was in a total contact cast to 2 weeks ago the wound was close to closed last week according to our intake nurse. We put Xeroform and gauze on this that the patient has been changing her self. She has a forefoot offloading boot. Electronic Signature(s) Signed: 03/14/2022 4:38:07 PM By: Baltazar Najjar MD Entered By:  Baltazar Najjar on 03/14/2022 08:37:15 -------------------------------------------------------------------------------- Physical Exam Details Patient Name: Date of Service: Donna [name] 03/14/2022 8:15 A M Medical Record Number: [id number] Patient Account Number: [id number] Date of Birth/Sex: Treating RN: Aug 30, 1965 (56 y.o. F) Primary Care Provider: Hillard Danker Other Clinician: Referring Provider: Treating  Provider/Extender: Shara Blazing in Treatment: 13 Constitutional Sitting or standing Blood Pressure is within target range for patient.. Pulse regular and within target range for patient.Marland Kitchen Respirations regular, non-labored and within target range.. Temperature is normal and within the target range for the patient.Marland Kitchen Appears in no distress. Notes Wound exam; the remanent wound was covered in nonadherent flaking skin. I remove this with pickups and a #15 scalpel. There is small open areas that are superficial. The area look too moist but no evidence of infection Electronic Signature(s) Signed: 03/14/2022 4:38:07 PM By: Baltazar Najjar MD Entered By: Baltazar Najjar on 03/14/2022 08:37:57 -------------------------------------------------------------------------------- Physician Orders Details Patient Name: Date of Service: Donna [name] 03/14/2022 8:15 A M Medical Record Number: [id number] Patient Account Number: [id number] Date of Birth/Sex: Treating RN: [birth year] (56 y.o. [name], [name] Primary Care Provider: Hillard Danker Other Clinician: Referring Provider: Treating Provider/Extender: Shara Blazing in Treatment: 13 Verbal / Phone Orders: No Diagnosis Coding ICD-10 Coding Code Description E11.621 Type 2 diabetes mellitus with foot ulcer L97.812 Non-pressure chronic ulcer of other part of right lower leg with fat layer exposed E11.42 Type 2 diabetes mellitus with diabetic polyneuropathy I10  Essential (primary) hypertension Follow-up Appointments ppointment in 1 week. Allen Derry, PA and [facility], Room 8 Wednesday 03/21/2022 0900 Return A Cellular or Tissue Based Products Wound #2 Right T Great oe Cellular or Tissue Based Product Type: - 01/10/2022 Epifix run insurance authorization 01/17/2022-pending 6/7/223 pending 01/31/2022 approved with high copay. Bathing/ Shower/ Hygiene May shower with protection but do not get wound dressing(s) wet. - May purchase cast protector from [facility], [facility], or [employer] Edema Control - Lymphedema / SCD / Other Elevate legs to the level of the heart or above for 30 minutes daily and/or when sitting, a frequency of: - 3-4 times a day throughout the day. Avoid standing for long periods of time. Off-Loading Wedge shoe to: - right foot while walking and standing. limit walking and standing. Wound Treatment Wound #2 - T Great oe Wound Laterality: Right Cleanser: Normal Saline 1 x Per Day/30 Days Discharge Instructions: Cleanse the wound with Normal Saline prior to applying a clean dressing using gauze sponges, not tissue or cotton balls. Prim Dressing: Maxorb Extra Calcium Alginate 2x2 in 1 x Per Day/30 Days ary Discharge Instructions: Apply calcium alginate to wound bed as instructed Secondary Dressing: Woven Gauze Sponge, Non-Sterile 4x4 in 1 x Per Day/30 Days Discharge Instructions: Apply x3 rolled gauze under the toe to aid in keeping the toe from bending causing friction/shear. Secondary Dressing: Woven Gauze Sponges 2x2 in (Generic) 1 x Per Day/30 Days Discharge Instructions: Apply over primary dressing as directed. Secured With: Insurance underwriter, Sterile 2x75 (in/in) (Generic) 1 x Per Day/30 Days Discharge Instructions: Secure with stretch gauze as directed. Secured With: 1M Medipore H Soft Cloth Surgical T ape, 4 x 10 (in/yd) (Generic) 1 x Per Day/30 Days Discharge Instructions: Secure with tape as directed. Electronic  Signature(s) Signed: 03/14/2022 4:38:07 PM By: Baltazar Najjar MD Signed: 03/14/2022 5:11:09 PM By: Shawn Stall RN, BSN Entered By: Shawn Stall on 03/14/2022 08:29:38 -------------------------------------------------------------------------------- Problem List Details Patient Name: Date of Service: Donna [name] 03/14/2022 8:15 A M Medical Record Number: [id number] Patient Account Number: [id number] Date of Birth/Sex: Treating RN: February 14, 1966 (56 y.o. [name] Primary Care Provider: Hillard Danker Other Clinician: Referring Provider: Treating Provider/Extender: Shara Blazing in Treatment: 13 Active Problems ICD-10 Encounter Code Description Active Date MDM Diagnosis E11.621  Type 2 diabetes mellitus with foot ulcer 12/13/2021 No Yes L97.518 Non-pressure chronic ulcer of other part of right foot with other specified 03/14/2022 No Yes severity E11.42 Type 2 diabetes mellitus with diabetic polyneuropathy 12/13/2021 No Yes I10 Essential (primary) hypertension 12/13/2021 No Yes Inactive Problems ICD-10 Code Description Active Date Inactive Date L97.812 Non-pressure chronic ulcer of other part of right lower leg with fat layer exposed 12/13/2021 12/13/2021 Resolved Problems Electronic Signature(s) Signed: 03/14/2022 4:38:07 PM By: Baltazar Najjar MD Entered By: Baltazar Najjar on 03/14/2022 08:35:25 -------------------------------------------------------------------------------- Progress Note Details Patient Name: Date of Service: Donna [name] 03/14/2022 8:15 A M Medical Record Number: [id number] Patient Account Number: [id number] Date of Birth/Sex: Treating RN: 10-03-1965 (56 y.o. 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 She tells me that since at least last summer she has had a progressive callused area that has opened closed but is never really healed. She has a history of calluses on her feet. She is a diabetic with neuropathy. She was referred to Dr. Allena Katz at [facility] who felt that this might be a large plantar wart she was treated with laser for 2 treatments and then subsequently with ammonium lactate none of this is apparently made any difference according to the patient. She has a very odd looking area with thick skin and callus and an almost crossed like area of open tissue. She also has erythema above this which she says is chronic. The other interesting part of her recent history is recurrent cellulitis on the right lower leg although this is separated from the area we are looking at. She has had trips to see infectious disease has been on Keflex for as long as 20 days. This is presumed to be strep. She saw Dr. Renold Don Past medical history includes type 2 diabetes, recurrent cellulitis of the right leg, right Achilles tendon repair a year ago for spontaneous rupture ABI in our clinic was 1.09 on the right 2/17; patient admitted the clinic last week had a thick callused area on the right lateral heel she is a diabetic with neuropathy. She had thick callus and across like area of open wound. I aggressively debrided this area to remove most of the callus and gave her Hydrofera Blue and heel off loader, heel cup and she has been wearing this religiously trying to keep the pressure off the area. She comes in today with a cross-like area of injury has healed she has a new open area that I do not remember from last time. Still some dry flaking skin and callus but not nearly as much as last week at which time I had to use a scalpel to remove this. 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 She came in 2 weeks ago with  a very thick hyperkeratotic callused area on her right lateral heel. There was a "crossed" shaped area of split tissue with an open wound at the bottom. This had previously been treated as a possible wart without improvement. I removed all of the callus when she first came in here. This is mostly epithelialized over still thick skin but nothing like it was. She has 1 small open area that has not healed she has been more religious about offloading the area 10/27/2020 upon evaluation  today patient appears to be doing well with regard to her wound although she does still apparently appear to have something still open in the base of the wound. Fortunately there is no evidence of infection at this time which is great news and in general I feel like she is doing decently well though she has a lot of callus buildup she is using the offloading shoe for her heel. At home she does not always wear this she tells me. Generally she is using flip-flops there. Of note her primary care provider did give her Keflex yesterday for cellulitis of this leg but has nothing to do with the heel location. 3/17; the patient's area actually looks a lot better. There is no open wound here. Much less callus. She was given a prescription for 40% urea cream I am not sure that really is required this week. She is going to require less friction on this area. I think she is going to need a different type of shoe going forward Readmission: 12-13-2028 upon evaluation today patient presents for reevaluation in the clinic she was last seen by Dr. Leanord Hawking on 11-03-2020 when she was discharged this was due to a crack on her heel. Currently she is actually having an issue with her great toe on the right foot. She tells me that this has been present currently for a couple of weeks since around November 22, 2021. She has had a lot of callus over this area in general but not an open wound until that time. Subsequently this has more recently opened and  has Become a greater concern she is also noted some odor which is disconcerting to her. She tells me that her greatest concern is that "she does not lose this toe". Again I completely understand her concern in this regard I definitely think we need to be aggressive in treating this and try to get things better. She is very Adult nurse. Patient does have a history of diabetes mellitus type 2 with diabetic neuropathy. She also tells me that she does have a history as well of hypertension which is pretty well controlled. She has not yet had an x-ray of the area and has not been on any antibiotics. Both are things I think we will need to do today. 12-20-2021 upon evaluation today patient appears to be doing well with regard to her toe ulcer all things considered. She has been tolerating the dressing changes without complication. I do have her x-ray as well as her culture for review today the culture did so Staphylococcus aureus no MRSA noted which was great news susceptible to doxycycline which is what I did place her on last week. Overall the odor that she was noticing has also cleared which is great news. With regard to the x-ray there does not appear to be any signs of osteomyelitis or bone erosion at this point which is also great news in general and very pleased with where things stand. 12-27-2021 upon evaluation today patient appears to be doing well with regard to her wound this is actually measuring significantly better which is great news. Overall I am extremely pleased with where we stand today. No fevers, chills, nausea, vomiting, or diarrhea. 01-03-2022 upon evaluation today patient's wound is actually showing signs of significant improvement which is also news. I am very pleased with what we are seeing there is a little bit of callus buildup still but we are going to work on that today otherwise she seems to be making excellent  progress which is great news. 01-10-2022 upon evaluation today patient  appears to be doing well currently in regard to her toe ulcer. She has been tolerating the dressing changes without complication. Fortunately there does not appear to be any evidence of active infection locally or systemically which is great news. No fevers, chills, nausea, vomiting, or diarrhea. 01-17-2022 upon evaluation today patient's toe was actually doing about the same this week there is not a tremendous improvement in size but overall the appearance of the wound is better it is much flatter I do think the [facility] has done well in that regard. We will still wait to hear back on the skin substitute. With that being said I do believe that the patient could benefit from again the skin substitute to try to help get this to heal much more effectively and quickly in the meantime I am trying to help flatten this out which I think is doing a pretty good job.. 6/7; wound on the plantar right great toe over the distal phalanx. Wound is slightly smaller but looks healthier there is rims of epithelialization she still has some callus on the toe indicative of inadequate pressure/friction relief. I talked to her about making sure in the forefoot offloading boot that the weight stays on her heel in stance phase 01-31-2022 upon evaluation patient's wound is still continuing to show signs of really minimal progress unfortunately. I actually believe that she may need to go into the total contact cast in order to get this to heal and I discussed that with her today as well. Obviously this has been a little bit of a barrier for her not because she does not want to do it and does not want to get healed but rather because it is going to prevent her from being able to drive which is obviously going to be a significant quality-of-life issue with what she does in the position she is in at work. 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 I do believe she  could work with the cast on but the issue simply is that she cannot drive to get to work. Otherwise should be able to walk and do what she needs to do. 02-07-2022 upon evaluation today patient appears to be doing well with regard to her wounds. She has been tolerating the dressing changes without complication. Fortunately I do not see any signs of infection although the wound itself is not significantly smaller as of yet. This is something that we are trying to work on I do believe a total contact cast would be the right way to go. I discussed this with the patient today as well. She is actually in agreement with giving this a trial although she is a little nervous about it being that she is claustrophobic. 6/23; patient presents for obligatory cast exchange. She reports that her heel feels sore from the total contact cast. On that she had no issues. 02-14-2022 upon evaluation today patient's wound is actually showing signs of excellent improvement. There does not appear to be any signs of active infection at this time and overall the toe looks amazing in just 1 week's time. We have come down almost half compared to where we were before size wise and this is also him as well. Overall I think that the casting has done extremely good for her and the toe in general looks significantly improved. I think this week  since we want to have to mess with the wound bed itself just the callus around she will likely have even better improvement come next time. 02-21-2022 upon evaluation today patient appears to be doing well with regard to her toe. She is actually showing signs of improvement this is a little bit slower than she would like to see but nonetheless we are seeing significant changes in the right direction in my opinion. Fortunately she is also developing a lot less callus on the end of the toe which is helpful for her as well in my opinion. 02-28-2022 upon evaluation today patient appears to be doing well  currently in regard to her wound. She has been tolerating the dressing changes. Fortunately I do not see any signs of infection I think the cast is doing an awesome job here. No fevers, chills, nausea, vomiting, or diarrhea. 03-07-2022 upon evaluation today patient appears to be doing well with regard to her wound although the collagen is very stuck and dry on the area on the tip of her toe at this point. Nonetheless I discussed with her that I am a little reluctant to put the cast on this week due to what appeared to been a blister along the left side of her toe laterally. Nonetheless this has been a little worried about the possibility of infection and I do not see anything obvious I think that we may not want to put the cast on this week just to be on the safe side she voiced understanding. With that being said if were not to use the cast she is going to need to make sure to have still some pressure relief I think making gauze bolster with 3 gauze up underneath the toe is going to do a good job here. We will roll and tape this and then subsequently anchored underneath the big toe should be using the front offloading shoe as well. 7/26; patient I know from a previous stay in this clinic. Type II diabetic with a wound on her right plantar great toe. She was in a total contact cast to 2 weeks ago the wound was close to closed last week according to our intake nurse. We put Xeroform and gauze on this that the patient has been changing her self. She has a forefoot offloading boot. Objective Constitutional Sitting or standing Blood Pressure is within target range for patient.. Pulse regular and within target range for patient.Marland Kitchen. Respirations regular, non-labored and within target range.. Temperature is normal and within the target range for the patient.Marland Kitchen. Appears in no distress. Vitals Time Taken: 8:15 AM, Height: 66 in, Weight: 220 lbs, BMI: 35.5, Temperature: 97.9 F, Pulse: 74 bpm, Respiratory Rate: 16  breaths/min, Blood Pressure: 123/73 mmHg. General Notes: Wound exam; the remanent wound was covered in nonadherent flaking skin. I remove this with pickups and a #15 scalpel. There is small open areas that are superficial. The area look too moist but no evidence of infection Integumentary (Hair, Skin) Wound #2 status is Open. Original cause of wound was Blister. The date acquired was: 11/22/2021. The wound has been in treatment 13 weeks. The wound is located on the Right T Great. The wound measures 0.1cm length x 0.1cm width x 0.1cm depth; 0.008cm^2 area and 0.001cm^3 volume. There is no tunneling oe or undermining noted. There is a none present amount of drainage noted. The wound margin is distinct with the outline attached to the wound base. There is no granulation within the wound bed. There is  no necrotic tissue within the wound bed. 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 There was a Selective/Open Wound Skin/Epidermis Debridement with a total area of 0.09 sq cm performed by Maxwell Caul., MD. With the following instrument(s): Blade, and Forceps to remove Viable and Non-Viable tissue/material. Material removed includes Callus and Skin: Epidermis and after achieving pain control using Lidocaine 4% Topical Solution. A time out was conducted at 08:25, prior to the start of the procedure. A Minimum amount of bleeding was controlled with Pressure. The procedure was tolerated well with a pain level of 0 throughout and a pain level of 0 following the procedure. Post Debridement  Measurements: 0.1cm length x 0.1cm width x 0.1cm depth; 0.001cm^3 volume. Character of Wound/Ulcer Post Debridement is improved. Severity of Tissue Post Debridement is: Limited to breakdown of skin. Post procedure Diagnosis Wound #2: Same as Pre-Procedure Plan Follow-up Appointments: Return Appointment in 1 week. Allen Derry, PA and [place], Room 8 Wednesday 03/21/2022 0900 Cellular or Tissue Based Products: Wound #2 Right T Great: oe Cellular or Tissue Based Product Type: - 01/10/2022 Ep[employer]ix run insurance authorization 01/17/2022-pending 6/7/223 pending 01/31/2022 approved with high copay. Bathing/ Shower/ Hygiene: May shower with protection but do not get wound dressing(s) wet. - May purchase cast protector from [facility], [facility], or [employer] Edema Control - Lymphedema / SCD / Other: Elevate legs to the level of the heart or above for 30 minutes daily and/or when sitting, a frequency of: - 3-4 times a day throughout the day. Avoid standing for long periods of time. Off-Loading: Wedge shoe to: - right foot while walking and standing. limit walking and standing. WOUND #2: - T Great Wound Laterality: Right oe Cleanser: Normal Saline 1 x Per Day/30 Days Discharge Instructions: Cleanse the wound with Normal Saline prior to applying a clean dressing using gauze sponges, not tissue or cotton balls. Prim Dressing: Maxorb Extra Calcium Alginate 2x2 in 1 x Per Day/30 Days ary Discharge Instructions: Apply calcium alginate to wound bed as instructed Secondary Dressing: Woven Gauze Sponge, Non-Sterile 4x4 in 1 x Per Day/30 Days Discharge Instructions: Apply x3 rolled gauze under the toe to aid in keeping the toe from bending causing friction/shear. Secondary Dressing: Woven Gauze Sponges 2x2 in (Generic) 1 x Per Day/30 Days Discharge Instructions: Apply over primary dressing as directed. Secured With: Insurance underwriter, Sterile 2x75 (in/in) (Generic) 1 x Per Day/30 Days Discharge Instructions:  Secure with stretch gauze as directed. Secured With: 27M Medipore H Soft Cloth Surgical T ape, 4 x 10 (in/yd) (Generic) 1 x Per Day/30 Days Discharge Instructions: Secure with tape as directed. 1. This is not yet fully epithelialized. 2. I thought the area was too moist change from Xeroform to calcium alginate still with the same secondary gauze wraps 3. I emphasized to her that her weight needs to be back on her heel to make the forefoot offloading boot successful 4. This is close to closed. Electronic Signature(s) Signed: 03/14/2022 4:38:07 PM By: Baltazar Najjar MD Entered By: Baltazar Najjar on 03/14/2022 08:39:11 -------------------------------------------------------------------------------- SuperBill Details Patient Name: Date of Service: Donna [name] 03/14/2022 Medical Record Number: [id number] Patient Account  Number: [id number] Date of Birth/Sex: Treating RN: [id number] (56 y.o. [name] Primary Care Provider: Hillard Danker Other Clinician: Referring Provider: Treating Provider/Extender: Shara Blazing in Treatment: 13 Diagnosis Coding ICD-10 Codes Code Description E11.621 Type 2 diabetes mellitus with foot ulcer L97.812 Non-pressure chronic ulcer of other part of right lower leg with fat layer exposed E11.42 Type 2 diabetes mellitus with diabetic polyneuropathy I10 Essential (primary) hypertension Facility Procedures CPT4 Code: [id number] Description: [phone] - DEBRIDE WOUND 1ST 20 SQ CM OR < ICD-10 Diagnosis Description L97.812 Non-pressure chronic ulcer of other part of right lower leg with fat layer expos Modifier: ed Quantity: 1 Physician Procedures : CPT4 Code Description Modifier [id number] - WC PHYS DEBR WO ANESTH 20 SQ CM ICD-10 Diagnosis Description L97.812 Non-pressure chronic ulcer of other part of right lower leg with fat layer exposed Quantity: 1 Electronic Signature(s) Signed: 03/14/2022 4:38:07 PM By: Baltazar Najjar MD Entered By: Baltazar Najjar on 03/14/2022 08:39:21

## 2022-03-14 NOTE — Progress Notes (Signed)
Donna, [name] ([id number]) Visit Report for 03/14/2022 Arrival Information Details Patient Name: Date of Service: Donna [name], Donna [name] 03/14/2022 8:15 A M Medical Record Number: [id number] Patient Account Number: [id number] Date of Birth/Sex: Treating RN: 07-Oct-1965 (56 y.o. [name] Primary Care Joeanna Howdyshell: Pricilla Holm Other Clinician: Referring Cataleah Stites: Treating Sherrol Vicars/Extender: Charlie Pitter in Treatment: 60 Visit Information History Since Last Visit Added or deleted any medications: No Patient Arrived: Ambulatory Any new allergies or adverse reactions: No Arrival Time: 08:15 Had a fall or experienced change in No Accompanied By: self activities of daily living that may affect Transfer Assistance: None risk of falls: Patient Identification Verified: Yes Signs or symptoms of abuse/neglect since last visito No Secondary Verification Process Completed: Yes Hospitalized since last visit: No Patient Requires Transmission-Based Precautions: No Implantable device outside of the clinic excluding No Patient Has Alerts: No cellular tissue based products placed in the center since last visit: Has Dressing in Place as Prescribed: Yes Has Footwear/Offloading in Place as Prescribed: Yes Right: Wedge Shoe Pain Present Now: No Electronic Signature(s) Signed: 03/14/2022 5:11:09 PM By: Deon Pilling RN, BSN Entered By: Deon Pilling on 03/14/2022 08:16:03 -------------------------------------------------------------------------------- Encounter Discharge Information Details Patient Name: Date of Service: Donna [name] 03/14/2022 8:15 A M Medical Record Number: [id number] Patient Account Number: [id number] Date of Birth/Sex: Treating RN: [birth year] (56 y.o. [name] Primary Care Edison Wollschlager: Pricilla Holm Other Clinician: Referring Cheyrl Buley: Treating Henchy Mccauley/Extender: Charlie Pitter in Treatment:  13 Encounter Discharge Information Items Post Procedure Vitals Discharge Condition: Stable Temperature (F): 97.9 Ambulatory Status: Ambulatory Pulse (bpm): 74 Discharge Destination: Home Respiratory Rate (breaths/min): 16 Transportation: Private Auto Blood Pressure (mmHg): 123/73 Accompanied By: self Schedule Follow-up Appointment: Yes Clinical Summary of Care: Electronic Signature(s) Signed: 03/14/2022 5:11:09 PM By: Deon Pilling RN, BSN Entered By: Deon Pilling on 03/14/2022 08:31:19 -------------------------------------------------------------------------------- Lower Extremity Assessment Details Patient Name: Date of Service: Donna, [name] 03/14/2022 8:15 A M Medical Record Number: [id number] Patient Account Number: [id number] Date of Birth/Sex: Treating RN: 01/12/1966 (56 y.o. [name] Primary Care Tayvia Faughnan: Pricilla Holm Other Clinician: Referring Masiya Claassen: Treating Luz Mares/Extender: Charlie Pitter in Treatment: 13 Edema Assessment Assessed: Shirlyn Goltz: No] Patrice Paradise: Yes] Edema: [Left: N] [Right: o] Calf Left: Right: Point of Measurement: 28 cm From Medial Instep 35 cm Ankle Left: Right: Point of Measurement: 8 cm From Medial Instep 23 cm Vascular Assessment Pulses: Dorsalis Pedis Palpable: [Right:Yes] Electronic Signature(s) Signed: 03/14/2022 5:11:09 PM By: Deon Pilling RN, BSN Entered By: Deon Pilling on 03/14/2022 08:18:09 -------------------------------------------------------------------------------- Multi Wound Chart Details Patient Name: Date of Service: Donna [name] 03/14/2022 8:15 A M Medical Record Number: [id number] Patient Account Number: [id number] Date of Birth/Sex: Treating RN: 1965-10-18 (56 y.o. F) Primary Care Bettey Muraoka: Pricilla Holm Other Clinician: Referring Almetta Liddicoat: Treating Kenyah Luba/Extender: Charlie Pitter in Treatment: 13 Vital Signs Height(in):  66 Pulse(bpm): 74 Weight(lbs): 220 Blood Pressure(mmHg): 123/73 Body Mass Index(BMI): 35.5 Temperature(F): 97.9 Respiratory Rate(breaths/min): 16 Photos: [2:Right T Great oe] [N/A:N/A N/A] Wound Location: [2:Blister] [N/A:N/A] Wounding Event: [2:Diabetic Wound/Ulcer of the Lower] [N/A:N/A] Primary Etiology: [2:Extremity Hypertension, Type II Diabetes,] [N/A:N/A] Comorbid History: [2:Neuropathy 11/22/2021] [N/A:N/A] Date Acquired: [2:13] [N/A:N/A] Weeks of Treatment: [2:Open] [N/A:N/A] Wound Status: [2:No] [N/A:N/A] Wound Recurrence: [2:0.1x0.1x0.1] [N/A:N/A] Measurements L x W x D (cm) [2:0.008] [N/A:N/A] A (cm) : rea [2:0.001] [N/A:N/A] Volume (cm) : [2:99.80%] [N/A:N/A] % Reduction in A [2:rea: 99.70%] [N/A:N/A] % Reduction in Volume: [2:Grade 2] [N/A:N/A] Classification: [2:None Present] [N/A:N/A] Exudate A mount: [  2:Distinct, outline attached] [N/A:N/A] Wound Margin: [2:None Present (0%)] [N/A:N/A] Granulation A mount: [2:None Present (0%)] [N/A:N/A] Necrotic A mount: [2:Fascia: No] [N/A:N/A] Exposed Structures: [2:Fat Layer (Subcutaneous Tissue): No Tendon: No Muscle: No Joint: No Bone: No Large (67-100%)] [N/A:N/A] Epithelialization: [2:Debridement - Selective/Open Wound] [N/A:N/A] Debridement: Pre-procedure Verification/Time Out 08:25 [N/A:N/A] Taken: [2:Lidocaine 4% Topical Solution] [N/A:N/A] Pain Control: [2:Callus] [N/A:N/A] Tissue Debrided: [2:Skin/Epidermis] [N/A:N/A] Level: [2:0.09] [N/A:N/A] Debridement A (sq cm): [2:rea Blade, Forceps] [N/A:N/A] Instrument: [2:Minimum] [N/A:N/A] Bleeding: [2:Pressure] [N/A:N/A] Hemostasis A chieved: [2:0] [N/A:N/A] Procedural Pain: [2:0] [N/A:N/A] Post Procedural Pain: [2:Procedure was tolerated well] [N/A:N/A] Debridement Treatment Response: [2:0.1x0.1x0.1] [N/A:N/A] Post Debridement Measurements L x W x D (cm) [2:0.001] [N/A:N/A] Post Debridement Volume: (cm) [2:Debridement] [N/A:N/A] Treatment Notes Wound #2 (Toe  Great) Wound Laterality: Right Cleanser Normal Saline Discharge Instruction: Cleanse the wound with Normal Saline prior to applying a clean dressing using gauze sponges, not tissue or cotton balls. Peri-Wound Care Topical Primary Dressing Maxorb Extra Calcium Alginate 2x2 in Discharge Instruction: Apply calcium alginate to wound bed as instructed Secondary Dressing Woven Gauze Sponge, Non-Sterile 4x4 in Discharge Instruction: Apply x3 rolled gauze under the toe to aid in keeping the toe from bending causing friction/shear. Woven Gauze Sponges 2x2 in Discharge Instruction: Apply over primary dressing as directed. Secured With Conforming Stretch Gauze Bandage, Sterile 2x75 (in/in) Discharge Instruction: Secure with stretch gauze as directed. 70M Medipore H Soft Cloth Surgical T ape, 4 x 10 (in/yd) Discharge Instruction: Secure with tape as directed. Compression Wrap Compression Stockings Add-Ons Electronic Signature(s) Signed: 03/14/2022 4:38:07 PM By: Linton Ham MD Entered By: Linton Ham on 03/14/2022 08:35:33 -------------------------------------------------------------------------------- Multi-Disciplinary Care Plan Details Patient Name: Date of Service: Donna [name]. 03/14/2022 8:15 A M Medical Record Number: [id number] Patient Account Number: [id number] Date of Birth/Sex: Treating RN: March 25, 1966 (56 y.o. [name], Meta.Reding Primary Care Sherlock Nancarrow: Pricilla Holm Other Clinician: Referring Georganna Maxson: Treating Marionette Meskill/Extender: Charlie Pitter in Treatment: 13 Active Inactive Pain, Acute or Chronic Nursing Diagnoses: Pain, acute or chronic: actual or potential Potential alteration in comfort, pain Goals: Patient will verbalize adequate pain control and receive pain control interventions during procedures as needed Date Initiated: 12/13/2021 Target Resolution Date: 03/28/2022 Goal Status: Active Patient/caregiver will verbalize  comfort level met Date Initiated: 12/13/2021 Date Inactivated: 03/14/2022 Target Resolution Date: 03/16/2022 Goal Status: Met Interventions: Encourage patient to take pain medications as prescribed Provide education on pain management Reposition patient for comfort Treatment Activities: Administer pain control measures as ordered : 12/13/2021 Notes: Wound/Skin Impairment Nursing Diagnoses: Knowledge deficit related to ulceration/compromised skin integrity Goals: Patient/caregiver will verbalize understanding of skin care regimen Date Initiated: 12/13/2021 Target Resolution Date: 03/28/2022 Goal Status: Active Interventions: Assess patient/caregiver ability to obtain necessary supplies Assess patient/caregiver ability to perform ulcer/skin care regimen upon admission and as needed Provide education on ulcer and skin care Treatment Activities: Skin care regimen initiated : 12/13/2021 Topical wound management initiated : 12/13/2021 Notes: Electronic Signature(s) Signed: 03/14/2022 5:11:09 PM By: Deon Pilling RN, BSN Entered By: Deon Pilling on 03/14/2022 08:30:07 -------------------------------------------------------------------------------- Pain Assessment Details Patient Name: Date of Service: Donna [name] 03/14/2022 8:15 A M Medical Record Number: [id number] Patient Account Number: [id number] Date of Birth/Sex: Treating RN: Sep 14, 1965 (56 y.o. [name] Primary Care Fleeta Kunde: Pricilla Holm Other Clinician: Referring Liston Thum: Treating Danielly Ackerley/Extender: Charlie Pitter in Treatment: 13 Active Problems Location of Pain Severity and Description of Pain Patient Has Paino No Site Locations Rate the pain. Current Pain Level: 0 Pain Management and Medication Current  Pain Management: Medication: No Cold Application: No Rest: No Massage: No Activity: No T.E.N.S.: No Heat Application: No Leg drop or elevation: No Is the Current  Pain Management Adequate: Adequate How does your wound impact your activities of daily livingo Sleep: No Bathing: No Appetite: No Relationship With Others: No Bladder Continence: No Emotions: No Bowel Continence: No Work: No Toileting: No Drive: No Dressing: No Hobbies: No Electronic Signature(s) Signed: 03/14/2022 5:11:09 PM By: Deon Pilling RN, BSN Entered By: Deon Pilling on 03/14/2022 08:16:24 -------------------------------------------------------------------------------- Patient/Caregiver Education Details Patient Name: Date of Service: Donna [name] 7/26/2023andnbsp8:15 [place] Record Number: [id number] Patient Account Number: [id number] Date of Birth/Gender: Treating RN: 08/08/1966 (56 y.o. [name] Primary Care Physician: Pricilla Holm Other Clinician: Referring Physician: Treating Physician/Extender: Charlie Pitter in Treatment: 13 Education Assessment Education Provided To: Patient Education Topics Provided Wound/Skin Impairment: Handouts: Skin Care Do's and Dont's Methods: Explain/Verbal Responses: Reinforcements needed Electronic Signature(s) Signed: 03/14/2022 5:11:09 PM By: Deon Pilling RN, BSN Entered By: Deon Pilling on 03/14/2022 08:27:59 -------------------------------------------------------------------------------- Wound Assessment Details Patient Name: Date of Service: Donna [name] 03/14/2022 8:15 A M Medical Record Number: [id number] Patient Account Number: [id number] Date of Birth/Sex: Treating RN: [birth year] (56 y.o. [name], Meta.Reding Primary Care Savio Albrecht: Pricilla Holm Other Clinician: Referring Breeanne Oblinger: Treating Sade Mehlhoff/Extender: Charlie Pitter in Treatment: 13 Wound Status Wound Number: 2 Primary Etiology: Diabetic Wound/Ulcer of the Lower Extremity Wound Location: Right T Great oe Wound Status: Open Wounding Event: Blister Comorbid  History: Hypertension, Type II Diabetes, Neuropathy Date Acquired: 11/22/2021 Weeks Of Treatment: 13 Clustered Wound: No Photos Wound Measurements Length: (cm) 0.1 Width: (cm) 0.1 Depth: (cm) 0.1 Area: (cm) 0.008 Volume: (cm) 0.001 % Reduction in Area: 99.8% % Reduction in Volume: 99.7% Epithelialization: Large (67-100%) Tunneling: No Undermining: No Wound Description Classification: Grade 2 Wound Margin: Distinct, outline attached Exudate Amount: None Present Foul Odor After Cleansing: No Slough/Fibrino No Wound Bed Granulation Amount: None Present (0%) Exposed Structure Necrotic Amount: None Present (0%) Fascia Exposed: No Fat Layer (Subcutaneous Tissue) Exposed: No Tendon Exposed: No Muscle Exposed: No Joint Exposed: No Bone Exposed: No Treatment Notes Wound #2 (Toe Great) Wound Laterality: Right Cleanser Normal Saline Discharge Instruction: Cleanse the wound with Normal Saline prior to applying a clean dressing using gauze sponges, not tissue or cotton balls. Peri-Wound Care Topical Primary Dressing Maxorb Extra Calcium Alginate 2x2 in Discharge Instruction: Apply calcium alginate to wound bed as instructed Secondary Dressing Woven Gauze Sponge, Non-Sterile 4x4 in Discharge Instruction: Apply x3 rolled gauze under the toe to aid in keeping the toe from bending causing friction/shear. Woven Gauze Sponges 2x2 in Discharge Instruction: Apply over primary dressing as directed. Secured With Conforming Stretch Gauze Bandage, Sterile 2x75 (in/in) Discharge Instruction: Secure with stretch gauze as directed. 39M Medipore H Soft Cloth Surgical T ape, 4 x 10 (in/yd) Discharge Instruction: Secure with tape as directed. Compression Wrap Compression Stockings Add-Ons Electronic Signature(s) Signed: 03/14/2022 5:11:09 PM By: Deon Pilling RN, BSN Entered By: Deon Pilling on 03/14/2022  08:21:19 -------------------------------------------------------------------------------- Vitals Details Patient Name: Date of Service: Donna [name] 03/14/2022 8:15 A M Medical Record Number: [id number] Patient Account Number: [id number] Date of Birth/Sex: Treating RN: [birth year] (56 y.o. [name] Primary Care Elorah Dewing: Pricilla Holm Other Clinician: Referring Jamiyah Dingley: Treating Sebastin Perlmutter/Extender: Charlie Pitter in Treatment: 13 Vital Signs Time Taken: 08:15 Temperature (F): 97.9 Height (in): 66 Pulse (bpm): 74 Weight (lbs): 220 Respiratory Rate (breaths/min): 16 Body Mass Index (  BMI): 35.5 Blood Pressure (mmHg): 123/73 Reference Range: 80 - 120 mg / dl Electronic Signature(s) Signed: 03/14/2022 5:11:09 PM By: Deon Pilling RN, BSN Entered By: Deon Pilling on 03/14/2022 08:16:17

## 2022-03-15 ENCOUNTER — Other Ambulatory Visit (HOSPITAL_COMMUNITY): Payer: Self-pay

## 2022-03-16 ENCOUNTER — Other Ambulatory Visit: Payer: Self-pay | Admitting: Internal Medicine

## 2022-03-16 DIAGNOSIS — R928 Other abnormal and inconclusive findings on diagnostic imaging of breast: Secondary | ICD-10-CM

## 2022-03-19 ENCOUNTER — Ambulatory Visit: Payer: 59

## 2022-03-19 ENCOUNTER — Ambulatory Visit
Admission: RE | Admit: 2022-03-19 | Discharge: 2022-03-19 | Disposition: A | Discharge status: Home / Self Care | Payer: 59 | Source: Ambulatory Visit | Attending: Internal Medicine | Admitting: Internal Medicine

## 2022-03-19 DIAGNOSIS — R928 Other abnormal and inconclusive findings on diagnostic imaging of breast: Secondary | ICD-10-CM

## 2022-03-21 ENCOUNTER — Encounter (HOSPITAL_BASED_OUTPATIENT_CLINIC_OR_DEPARTMENT_OTHER): Payer: 59 | Attending: Physician Assistant | Admitting: Physician Assistant

## 2022-03-21 DIAGNOSIS — E1142 Type 2 diabetes mellitus with diabetic polyneuropathy: Secondary | ICD-10-CM | POA: Insufficient documentation

## 2022-03-21 DIAGNOSIS — I10 Essential (primary) hypertension: Secondary | ICD-10-CM | POA: Insufficient documentation

## 2022-03-21 DIAGNOSIS — L97512 Non-pressure chronic ulcer of other part of right foot with fat layer exposed: Secondary | ICD-10-CM | POA: Diagnosis not present

## 2022-03-21 DIAGNOSIS — E11621 Type 2 diabetes mellitus with foot ulcer: Secondary | ICD-10-CM | POA: Insufficient documentation

## 2022-03-21 DIAGNOSIS — L97518 Non-pressure chronic ulcer of other part of right foot with other specified severity: Secondary | ICD-10-CM | POA: Insufficient documentation

## 2022-03-21 NOTE — Progress Notes (Addendum)
SEVILLA, MURTAGH ([id number]) Visit Report for 03/21/2022 Chief Complaint Document Details Patient Name: Date of Service: Donna [name], Donna [name] 03/21/2022 9:00 A M Medical Record Number: [id number] Patient Account Number: [id number] Date of Birth/Sex: Treating RN: 1966-03-16 (56 y.o. Donna [name] Primary Care Provider: Hillard Danker Other Clinician: Referring Provider: Treating Provider/Extender: [name] in Treatment: 14 Information Obtained from: Patient Chief Complaint Right great toe ulcer Electronic Signature(s) Signed: 03/21/2022 9:20:16 AM By: Lenda Kelp PA-C Entered By: Lenda Kelp on 03/21/2022 09:20:16 -------------------------------------------------------------------------------- Debridement Details Patient Name: Date of Service: Donna [name]. 03/21/2022 9:00 A M Medical Record Number: [id number] Patient Account Number: [id number] Date of Birth/Sex: Treating RN: 01/18/1966 (57 y.o. Donna [name], Donna [name] Primary Care Provider: Hillard Danker Other Clinician: Referring Provider: Treating Provider/Extender: [name] in Treatment: 14 Debridement Performed for Assessment: Wound #2 Right T Great oe Performed By: Physician Lenda Kelp, PA Debridement Type: Debridement Severity of Tissue Pre Debridement: Fat layer exposed Level of Consciousness (Pre-procedure): Awake and Alert Pre-procedure Verification/Time Out Yes - 09:40 Taken: Start Time: 09:41 Pain Control: Lidocaine 5% topical ointment T Area Debrided (L x W): otal 1 (cm) x 1 (cm) = 1 (cm) Tissue and other material debrided: Viable, Non-Viable, Callus, Slough, Subcutaneous, Slough Level: Skin/Subcutaneous Tissue Debridement Description: Excisional Instrument: Curette Bleeding: Minimum Hemostasis Achieved: Pressure End Time: 09:46 Procedural Pain: 0 Post Procedural Pain: 0 Response to Treatment: Procedure was tolerated  well Level of Consciousness (Post- Awake and Alert procedure): Post Debridement Measurements of Total Wound Length: (cm) 0.1 Width: (cm) 0.1 Depth: (cm) 0.1 Volume: (cm) 0.001 Character of Wound/Ulcer Post Debridement: Improved Severity of Tissue Post Debridement: Fat layer exposed Post Procedure Diagnosis Same as Pre-procedure Electronic Signature(s) Signed: 03/21/2022 10:05:34 AM By: Lenda Kelp PA-C Signed: 03/21/2022 6:08:09 PM By: Shawn Stall RN, BSN Entered By: Lenda Kelp on 03/21/2022 10:05:34 -------------------------------------------------------------------------------- HPI Details Patient Name: Date of Service: Donna [name]. 03/21/2022 9:00 A M Medical Record Number: [id number] Patient Account Number: [id number] Date of Birth/Sex: Treating RN: June 18, 1966 (56 y.o. 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 She tells me that since at least last summer she has had a progressive callused area that has opened closed but is never really healed. She has a history of calluses on her feet. She is a diabetic with neuropathy. She was referred to Dr. Allena Katz at [facility] who felt that this might be a large plantar wart she was treated with laser for 2 treatments and then subsequently with ammonium lactate none of this is apparently made any difference according to the patient. She has a very odd looking area with thick skin and callus and an almost crossed like area of open tissue. She also has erythema above this which she says is chronic. The other interesting part of her recent history is recurrent cellulitis on the right lower leg although this is separated from the area we  are looking at. She has had trips to see infectious disease has been on Keflex for as long as 20 days. This is presumed to be strep. She saw Dr. Renold Don Past medical history includes type 2 diabetes, recurrent cellulitis of the right leg, right Achilles tendon repair a year ago  for spontaneous rupture ABI in our clinic was 1.09 on the right 2/17; patient admitted the clinic last week had a thick callused area on the right lateral heel she is a diabetic with neuropathy. She had thick callus and across like area of open wound. I aggressively debrided this area to remove most of the callus and gave her Hydrofera Blue and heel off loader, heel cup and she has been wearing this religiously trying to keep the pressure off the area. She comes in today with a cross-like area of injury has healed she has a new open area that I do not remember from last time. Still some dry flaking skin and callus but not nearly as much as last week at which time I had to use a scalpel to remove this. 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 She came in 2 weeks ago with a very thick hyperkeratotic callused area on her right lateral heel. There was a "crossed" shaped area of split tissue with an open wound at the bottom. This had previously been treated as a possible wart without improvement. I removed all of the callus when she first came in here. This is mostly epithelialized over still thick skin but nothing like it was. She has 1 small open area that has not healed she has been more religious about offloading the area 10/27/2020 upon evaluation today patient appears to be doing well with regard to her wound although she does still apparently appear to have something still open in the base of the wound. Fortunately there is no evidence of infection at this time which is great news and in general I feel like she is doing decently well though she has a lot of callus buildup she is using the offloading shoe  for her heel. At home she does not always wear this she tells me. Generally she is using flip-flops there. Of note her primary care provider did give her Keflex yesterday for cellulitis of this leg but has nothing to do with the heel location. 3/17; the patient's area actually looks a lot better. There is no open wound here. Much less callus. She was given a prescription for 40% urea cream I am not sure that really is required this week. She is going to require less friction on this area. I think she is going to need a different type of shoe going forward Readmission: 12-13-2028 upon evaluation today patient presents for reevaluation in the clinic she was last seen by Dr. Dellia Nims on 11-03-2020 when she was discharged this was due to a crack on her heel. Currently she is actually having an issue with her great toe on the right foot. She tells me that this has been present currently for a couple of weeks since around November 22, 2021. She has had a lot of callus over this area in general but not an open wound until that time. Subsequently this has more recently opened and has Become a greater concern she is also noted some odor which is disconcerting to her. She tells me that her greatest concern is that "she does not lose this toe". Again I completely understand her concern in this regard I definitely think we need to be aggressive in treating this and try to get things better. She is very Patent attorney. Patient does have a history of diabetes mellitus type 2 with diabetic neuropathy. She also tells me that she does have a history as well of hypertension which is pretty  well controlled. She has not yet had an x-ray of the area and has not been on any antibiotics. Both are things I think we will need to do today. 12-20-2021 upon evaluation today patient appears to be doing well with regard to her toe ulcer all things considered. She has been tolerating the dressing changes without complication. I do have her  x-ray as well as her culture for review today the culture did so Staphylococcus aureus no MRSA noted which was great news susceptible to doxycycline which is what I did place her on last week. Overall the odor that she was noticing has also cleared which is great news. With regard to the x-ray there does not appear to be any signs of osteomyelitis or bone erosion at this point which is also great news in general and very pleased with where things stand. 12-27-2021 upon evaluation today patient appears to be doing well with regard to her wound this is actually measuring significantly better which is great news. Overall I am extremely pleased with where we stand today. No fevers, chills, nausea, vomiting, or diarrhea. 01-03-2022 upon evaluation today patient's wound is actually showing signs of significant improvement which is also news. I am very pleased with what we are seeing there is a little bit of callus buildup still but we are going to work on that today otherwise she seems to be making excellent progress which is great news. 01-10-2022 upon evaluation today patient appears to be doing well currently in regard to her toe ulcer. She has been tolerating the dressing changes without complication. Fortunately there does not appear to be any evidence of active infection locally or systemically which is great news. No fevers, chills, nausea, vomiting, or diarrhea. 01-17-2022 upon evaluation today patient's toe was actually doing about the same this week there is not a tremendous improvement in size but overall the appearance of the wound is better it is much flatter I do think the [facility] has done well in that regard. We will still wait to hear back on the skin substitute. With that being said I do believe that the patient could benefit from again the skin substitute to try to help get this to heal much more effectively and quickly in the meantime I am trying to help flatten this out which I think  is doing a pretty good job.. 6/7; wound on the plantar right great toe over the distal phalanx. Wound is slightly smaller but looks healthier there is rims of epithelialization she still has some callus on the toe indicative of inadequate pressure/friction relief. I talked to her about making sure in the forefoot offloading boot that the weight stays on her heel in stance phase 01-31-2022 upon evaluation patient's wound is still continuing to show signs of really minimal progress unfortunately. I actually believe that she may need to go into the total contact cast in order to get this to heal and I discussed that with her today as well. Obviously this has been a little bit of a barrier for her not because she does not want to do it and does not want to get healed but rather because it is going to prevent her from being able to drive which is obviously going to be a significant quality-of-life issue with what she does in the position she is in at work. 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 I do believe she could  work with the cast on but the issue simply is that she cannot drive to get to work. Otherwise should be able to walk and do what she needs to do. 02-07-2022 upon evaluation today patient appears to be doing well with regard to her wounds. She has been tolerating the dressing changes without complication. Fortunately I do not see any signs of infection although the wound itself is not significantly smaller as of yet. This is something that we are trying to work on I do believe a total contact cast would be the right way to go. I discussed this with the patient today as well. She is actually in agreement with giving this a trial although she is a little nervous about it being that she is claustrophobic. 6/23; patient presents for obligatory cast exchange. She reports that her heel feels sore from the total contact cast. On that she  had no issues. 02-14-2022 upon evaluation today patient's wound is actually showing signs of excellent improvement. There does not appear to be any signs of active infection at this time and overall the toe looks amazing in just 1 week's time. We have come down almost half compared to where we were before size wise and this is also him as well. Overall I think that the casting has done extremely good for her and the toe in general looks significantly improved. I think this week since we want to have to mess with the wound bed itself just the callus around she will likely have even better improvement come next time. 02-21-2022 upon evaluation today patient appears to be doing well with regard to her toe. She is actually showing signs of improvement this is a little bit slower than she would like to see but nonetheless we are seeing significant changes in the right direction in my opinion. Fortunately she is also developing a lot less callus on the end of the toe which is helpful for her as well in my opinion. 02-28-2022 upon evaluation today patient appears to be doing well currently in regard to her wound. She has been tolerating the dressing changes. Fortunately I do not see any signs of infection I think the cast is doing an awesome job here. No fevers, chills, nausea, vomiting, or diarrhea. 03-07-2022 upon evaluation today patient appears to be doing well with regard to her wound although the collagen is very stuck and dry on the area on the tip of her toe at this point. Nonetheless I discussed with her that I am a little reluctant to put the cast on this week due to what appeared to been a blister along the left side of her toe laterally. Nonetheless this has been a little worried about the possibility of infection and I do not see anything obvious I think that we may not want to put the cast on this week just to be on the safe side she voiced understanding. With that being said if were not to use the  cast she is going to need to make sure to have still some pressure relief I think making gauze bolster with 3 gauze up underneath the toe is going to do a good job here. We will roll and tape this and then subsequently anchored underneath the big toe should be using the front offloading shoe as well. 7/26; patient I know from a previous stay in this clinic. Type II diabetic with a wound on her right plantar great toe. She was in a total contact cast to  2 weeks ago the wound was close to closed last week according to our intake nurse. We put Xeroform and gauze on this that the patient has been changing her self. She has a forefoot offloading boot. 03-21-2022 upon a variable evaluation today patient appears to be doing somewhat poorly in regard to callus buildup at this time. Fortunately I do not see any evidence of overall worsening but this is just still open. No fevers, chills, nausea, vomiting, or diarrhea. I think we may have to go back into the cast next week if this is not significantly improved by that time. Electronic Signature(s) Signed: 03/21/2022 10:03:39 AM By: Lenda KelpStone III, Rodricus Candelaria PA-C Entered By: Lenda KelpStone III, Yicel [name] on 03/21/2022 10:03:39 -------------------------------------------------------------------------------- Physical Exam Details Patient Name: Date of Service: Donna BollmanCURRIN, Donna R. 03/21/2022 9:00 A M Medical Record Number: [id number] Patient Account Number: [id number] Date of Birth/Sex: Treating RN: 10/06/1965 (56 y.o. Donna SilenceF) [name], Donna Primary Care Provider: Hillard Dankerrawford, Donna [name] Other Clinician: Referring Provider: Treating Provider/Extender: Jolee EwingStone III, Jouri Threat [name], Donna [name] Weeks in Treatment: 14 Constitutional Well-nourished and well-hydrated in no acute distress. Respiratory normal breathing without difficulty. Psychiatric this patient is able to make decisions and demonstrates good insight into disease process. Alert and Oriented x 3. pleasant and cooperative. Notes Upon  inspection patient's wound bed actually showed signs of some need for sharp debridement I did perform debridement clearway some necrotic debris she tolerated that today without complication and postdebridement wound bed appears to be doing much better which is great news. No fevers, chills, nausea, vomiting, or diarrhea. Electronic Signature(s) Signed: 03/21/2022 10:03:56 AM By: Lenda KelpStone III, Donna Voght PA-C Entered By: Lenda KelpStone III, Donna [name] on 03/21/2022 10:03:56 -------------------------------------------------------------------------------- Physician Orders Details Patient Name: Date of Service: Donna BollmanCURRIN, Donna R. 03/21/2022 9:00 A M Medical Record Number: [id number] Patient Account Number: [id number] Date of Birth/Sex: Treating RN: 11/15/1965 (56 y.o. Donna SilenceF) [name], Donna Primary Care Provider: Hillard Dankerrawford, Donna [name] Other Clinician: Referring Provider: Treating Provider/Extender: Jolee EwingStone III, Donna [name], Donna [name] Weeks in Treatment: [phone] Verbal / Phone Orders: No Diagnosis Coding ICD-10 Coding Code Description E11.621 Type 2 diabetes mellitus with foot ulcer L97.518 Non-pressure chronic ulcer of other part of right foot with other specified severity E11.42 Type 2 diabetes mellitus with diabetic polyneuropathy I10 Essential (primary) hypertension Follow-up Appointments ppointment in 1 week. Allen Derry- Artia Singley Stone, PA and [place], Room 8 Wednesday 03/28/2022 0900 Return A Cellular or Tissue Based Products Wound #2 Right T Great oe Cellular or Tissue Based Product Type: - 01/10/2022 Epifix run insurance authorization 01/17/2022-pending 6/7/223 pending 01/31/2022 approved with high copay. Bathing/ Shower/ Hygiene May shower with protection but do not get wound dressing(s) wet. - May purchase cast protector from [facility], [facility], or [employer] Edema Control - Lymphedema / SCD / Other Elevate legs to the level of the heart or above for 30 minutes daily and/or when sitting, a frequency of: - 3-4 times a day throughout the  day. Avoid standing for long periods of time. Off-Loading Wedge shoe to: - right foot while walking and standing. limit walking and standing. Wound Treatment Wound #2 - T Great oe Wound Laterality: Right Cleanser: Normal Saline 1 x Per Day/30 Days Discharge Instructions: Cleanse the wound with Normal Saline prior to applying a clean dressing using gauze sponges, not tissue or cotton balls. Prim Dressing: Promogran Prisma Matrix, 4.34 (sq in) (silver collagen) ary 1 x Per Day/30 Days Discharge Instructions: Moisten collagen with hydrogel Prim Dressing: adaptic 1 x Per Day/30 Days ary Discharge Instructions: apply over the collagen. Secondary Dressing: Woven Gauze Sponge, Non-Sterile  4x4 in 1 x Per Day/30 Days Discharge Instructions: Apply x3 rolled gauze under the toe to aid in keeping the toe from bending causing friction/shear. Secondary Dressing: Woven Gauze Sponges 2x2 in (Generic) 1 x Per Day/30 Days Discharge Instructions: Apply over primary dressing as directed. Secured With: Insurance underwriter, Sterile 2x75 (in/in) (Generic) 1 x Per Day/30 Days Discharge Instructions: Secure with stretch gauze as directed. Secured With: 53M Medipore H Soft Cloth Surgical T ape, 4 x 10 (in/yd) (Generic) 1 x Per Day/30 Days Discharge Instructions: Secure with tape as directed. Electronic Signature(s) Signed: 03/21/2022 5:04:53 PM By: Lenda Kelp PA-C Signed: 03/21/2022 6:08:09 PM By: Shawn Stall RN, BSN Entered By: Shawn Stall on 03/21/2022 09:57:23 -------------------------------------------------------------------------------- Problem List Details Patient Name: Date of Service: Donna [name] 03/21/2022 9:00 A M Medical Record Number: [id number] Patient Account Number: [id number] Date of Birth/Sex: Treating RN: Dec 05, 1965 (56 y.o. Donna [name], Donna [name] Primary Care Provider: Hillard Danker Other Clinician: Referring Provider: Treating Provider/Extender: [name] in Treatment: 14 Active Problems ICD-10 Encounter Code Description Active Date MDM Diagnosis E11.621 Type 2 diabetes mellitus with foot ulcer 12/13/2021 No Yes L97.518 Non-pressure chronic ulcer of other part of right foot with other specified 03/14/2022 No Yes severity E11.42 Type 2 diabetes mellitus with diabetic polyneuropathy 12/13/2021 No Yes I10 Essential (primary) hypertension 12/13/2021 No Yes Inactive Problems ICD-10 Code Description Active Date Inactive Date L97.812 Non-pressure chronic ulcer of other part of right lower leg with fat layer exposed 12/13/2021 12/13/2021 Resolved Problems Electronic Signature(s) Signed: 03/21/2022 9:20:08 AM By: Lenda Kelp PA-C Entered By: Lenda Kelp on 03/21/2022 09:20:08 -------------------------------------------------------------------------------- Progress Note Details Patient Name: Date of Service: Donna [name]. 03/21/2022 9:00 A M Medical Record Number: [id number] Patient Account Number: [id number] Date of Birth/Sex: Treating RN: 05-14-1966 (56 y.o. 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 She tells me that since at least last summer she has had a progressive callused area that has opened closed but is never really healed. She has a history of calluses on her feet. She is a diabetic with neuropathy. She was referred to Dr. Allena Katz at [facility] who felt that this might be a large plantar wart she was treated with laser for 2 treatments and then subsequently with ammonium lactate none of this is apparently made any difference  according to the patient. She has a very odd looking area with thick skin and callus and an almost crossed like area of open tissue. She also has erythema above this which she says is chronic. The other interesting part of her recent history is recurrent cellulitis on the right lower leg although this is separated from the area we are looking at. She has had trips to see infectious disease has been on Keflex for as long as 20 days. This is presumed to be strep. She saw Dr. Renold Don Past medical history includes type 2 diabetes, recurrent cellulitis of the right leg, right Achilles tendon repair a year ago for spontaneous rupture ABI in our clinic was 1.09 on the right 2/17; patient admitted the clinic last week had a thick callused area on the right lateral heel she is a diabetic with  neuropathy. She had thick callus and across like area of open wound. I aggressively debrided this area to remove most of the callus and gave her Hydrofera Blue and heel off loader, heel cup and she has been wearing this religiously trying to keep the pressure off the area. She comes in today with a cross-like area of injury has healed she has a new open area that I do not remember from last time. Still some dry flaking skin and callus but not nearly as much as last week at which time I had to use a scalpel to remove this. 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 She came in 2 weeks ago with a very thick hyperkeratotic callused area on her right lateral heel. There was a "crossed" shaped area of split tissue with an open wound at the bottom. This had previously been treated as a possible wart without improvement. I removed all of the callus when she first came in here. This is mostly epithelialized over still thick skin but nothing like it was. She has 1 small open area that has not healed she has been more religious about offloading the area 10/27/2020 upon evaluation today patient appears to be doing  well with regard to her wound although she does still apparently appear to have something still open in the base of the wound. Fortunately there is no evidence of infection at this time which is great news and in general I feel like she is doing decently well though she has a lot of callus buildup she is using the offloading shoe for her heel. At home she does not always wear this she tells me. Generally she is using flip-flops there. Of note her primary care provider did give her Keflex yesterday for cellulitis of this leg but has nothing to do with the heel location. 3/17; the patient's area actually looks a lot better. There is no open wound here. Much less callus. She was given a prescription for 40% urea cream I am not sure that really is required this week. She is going to require less friction on this area. I think she is going to need a different type of shoe going forward Readmission: 12-13-2028 upon evaluation today patient presents for reevaluation in the clinic she was last seen by Dr. Leanord Hawking on 11-03-2020 when she was discharged this was due to a crack on her heel. Currently she is actually having an issue with her great toe on the right foot. She tells me that this has been present currently for a couple of weeks since around November 22, 2021. She has had a lot of callus over this area in general but not an open wound until that time. Subsequently this has more recently opened and has Become a greater concern she is also noted some odor which is disconcerting to her. She tells me that her greatest concern is that "she does not lose this toe". Again I completely understand her concern in this regard I definitely think we need to be aggressive in treating this and try to get things better. She is very Adult nurse. Patient does have a history of diabetes mellitus type 2 with diabetic neuropathy. She also tells me that she does have a history as well of hypertension which is pretty well  controlled. She has not yet had an x-ray of the area and has not been on any antibiotics. Both are things I think we will need to do today. 12-20-2021 upon evaluation  today patient appears to be doing well with regard to her toe ulcer all things considered. She has been tolerating the dressing changes without complication. I do have her x-ray as well as her culture for review today the culture did so Staphylococcus aureus no MRSA noted which was great news susceptible to doxycycline which is what I did place her on last week. Overall the odor that she was noticing has also cleared which is great news. With regard to the x-ray there does not appear to be any signs of osteomyelitis or bone erosion at this point which is also great news in general and very pleased with where things stand. 12-27-2021 upon evaluation today patient appears to be doing well with regard to her wound this is actually measuring significantly better which is great news. Overall I am extremely pleased with where we stand today. No fevers, chills, nausea, vomiting, or diarrhea. 01-03-2022 upon evaluation today patient's wound is actually showing signs of significant improvement which is also news. I am very pleased with what we are seeing there is a little bit of callus buildup still but we are going to work on that today otherwise she seems to be making excellent progress which is great news. 01-10-2022 upon evaluation today patient appears to be doing well currently in regard to her toe ulcer. She has been tolerating the dressing changes without complication. Fortunately there does not appear to be any evidence of active infection locally or systemically which is great news. No fevers, chills, nausea, vomiting, or diarrhea. 01-17-2022 upon evaluation today patient's toe was actually doing about the same this week there is not a tremendous improvement in size but overall the appearance of the wound is better it is much flatter I do  think the [facility] has done well in that regard. We will still wait to hear back on the skin substitute. With that being said I do believe that the patient could benefit from again the skin substitute to try to help get this to heal much more effectively and quickly in the meantime I am trying to help flatten this out which I think is doing a pretty good job.. 6/7; wound on the plantar right great toe over the distal phalanx. Wound is slightly smaller but looks healthier there is rims of epithelialization she still has some callus on the toe indicative of inadequate pressure/friction relief. I talked to her about making sure in the forefoot offloading boot that the weight stays on her heel in stance phase 01-31-2022 upon evaluation patient's wound is still continuing to show signs of really minimal progress unfortunately. I actually believe that she may need to go into the total contact cast in order to get this to heal and I discussed that with her today as well. Obviously this has been a little bit of a barrier for her not because she does not want to do it and does not want to get healed but rather because it is going to prevent her from being able to drive which is obviously going to be a significant quality-of-life issue with what she does in the position she is in at work. 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 I do believe she could work with the cast on but the issue simply is that she cannot drive to get to work. Otherwise should be able to walk and do what she needs to do. 02-07-2022 upon evaluation  today patient appears to be doing well with regard to her wounds. She has been tolerating the dressing changes without complication. Fortunately I do not see any signs of infection although the wound itself is not significantly smaller as of yet. This is something that we are trying to work on I do believe a total contact cast  would be the right way to go. I discussed this with the patient today as well. She is actually in agreement with giving this a trial although she is a little nervous about it being that she is claustrophobic. 6/23; patient presents for obligatory cast exchange. She reports that her heel feels sore from the total contact cast. On that she had no issues. 02-14-2022 upon evaluation today patient's wound is actually showing signs of excellent improvement. There does not appear to be any signs of active infection at this time and overall the toe looks amazing in just 1 week's time. We have come down almost half compared to where we were before size wise and this is also him as well. Overall I think that the casting has done extremely good for her and the toe in general looks significantly improved. I think this week since we want to have to mess with the wound bed itself just the callus around she will likely have even better improvement come next time. 02-21-2022 upon evaluation today patient appears to be doing well with regard to her toe. She is actually showing signs of improvement this is a little bit slower than she would like to see but nonetheless we are seeing significant changes in the right direction in my opinion. Fortunately she is also developing a lot less callus on the end of the toe which is helpful for her as well in my opinion. 02-28-2022 upon evaluation today patient appears to be doing well currently in regard to her wound. She has been tolerating the dressing changes. Fortunately I do not see any signs of infection I think the cast is doing an awesome job here. No fevers, chills, nausea, vomiting, or diarrhea. 03-07-2022 upon evaluation today patient appears to be doing well with regard to her wound although the collagen is very stuck and dry on the area on the tip of her toe at this point. Nonetheless I discussed with her that I am a little reluctant to put the cast on this week due to what  appeared to been a blister along the left side of her toe laterally. Nonetheless this has been a little worried about the possibility of infection and I do not see anything obvious I think that we may not want to put the cast on this week just to be on the safe side she voiced understanding. With that being said if were not to use the cast she is going to need to make sure to have still some pressure relief I think making gauze bolster with 3 gauze up underneath the toe is going to do a good job here. We will roll and tape this and then subsequently anchored underneath the big toe should be using the front offloading shoe as well. 7/26; patient I know from a previous stay in this clinic. Type II diabetic with a wound on her right plantar great toe. She was in a total contact cast to 2 weeks ago the wound was close to closed last week according to our intake nurse. We put Xeroform and gauze on this that the patient has been changing her self. She has a  forefoot offloading boot. 03-21-2022 upon a variable evaluation today patient appears to be doing somewhat poorly in regard to callus buildup at this time. Fortunately I do not see any evidence of overall worsening but this is just still open. No fevers, chills, nausea, vomiting, or diarrhea. I think we may have to go back into the cast next week if this is not significantly improved by that time. Objective Constitutional Well-nourished and well-hydrated in no acute distress. Vitals Time Taken: 9:19 AM, Height: 66 in, Weight: 220 lbs, BMI: 35.5, Temperature: 97.6 F, Pulse: 73 bpm, Respiratory Rate: 20 breaths/min, Blood Pressure: 143/79 mmHg. Respiratory normal breathing without difficulty. Psychiatric this patient is able to make decisions and demonstrates good insight into disease process. Alert and Oriented x 3. pleasant and cooperative. General Notes: Upon inspection patient's wound bed actually showed signs of some need for sharp debridement I  did perform debridement clearway some necrotic debris she tolerated that today without complication and postdebridement wound bed appears to be doing much better which is great news. No fevers, chills, nausea, vomiting, or diarrhea. Integumentary (Hair, Skin) Wound #2 status is Open. Original cause of wound was Blister. The date acquired was: 11/22/2021. The wound has been in treatment 14 weeks. The wound is located on the Right T Great. The wound measures 0.1cm length x 0.1cm width x 0.1cm depth; 0.008cm^2 area and 0.001cm^3 volume. There is no tunneling oe or undermining noted. There is a none present amount of drainage noted. The wound margin is distinct with the outline attached to the wound base. There is no granulation within the wound bed. There is no necrotic tissue within the wound bed. Assessment Active Problems ICD-10 Type 2 diabetes mellitus with foot ulcer Non-pressure chronic ulcer of other part of right foot with other specified severity Type 2 diabetes mellitus with diabetic polyneuropathy Essential (primary) hypertension Procedures Wound #2 Pre-procedure diagnosis of Wound #2 is a Diabetic Wound/Ulcer of the Lower Extremity located on the Right T Great .Severity of Tissue Pre Debridement is: oe Fat layer exposed. There was a Excisional Skin/Subcutaneous Tissue Debridement with a total area of 1 sq cm performed by Lenda Kelp, PA. With the following instrument(s): Curette to remove Viable and Non-Viable tissue/material. Material removed includes Callus, Subcutaneous Tissue, and Slough after achieving pain control using Lidocaine 5% topical ointment. A time out was conducted at 09:40, prior to the start of the procedure. A Minimum amount of bleeding was controlled with Pressure. The procedure was tolerated well with a pain level of 0 throughout and a pain level of 0 following the procedure. Post Debridement Measurements: 0.1cm length x 0.1cm width x 0.1cm depth; 0.001cm^3  volume. Character of Wound/Ulcer Post Debridement is improved. Severity of Tissue Post Debridement is: Fat layer exposed. Post procedure Diagnosis Wound #2: Same as Pre-Procedure Plan Follow-up Appointments: Return Appointment in 1 week. Allen Derry, PA and [place], Room 8 Wednesday 03/28/2022 0900 Cellular or Tissue Based Products: Wound #2 Right T Great: oe Cellular or Tissue Based Product Type: - 01/10/2022 [employer] run insurance authorization 01/17/2022-pending 6/7/223 pending 01/31/2022 approved with high copay. Bathing/ Shower/ Hygiene: May shower with protection but do not get wound dressing(s) wet. - May purchase cast protector from [facility], [facility], or [employer] Edema Control - Lymphedema / SCD / Other: Elevate legs to the level of the heart or above for 30 minutes daily and/or when sitting, a frequency of: - 3-4 times a day throughout the day. Avoid standing for long periods of time. Off-Loading: Wedge  shoe to: - right foot while walking and standing. limit walking and standing. WOUND #2: - T Great Wound Laterality: Right oe Cleanser: Normal Saline 1 x Per Day/30 Days Discharge Instructions: Cleanse the wound with Normal Saline prior to applying a clean dressing using gauze sponges, not tissue or cotton balls. Prim Dressing: Promogran Prisma Matrix, 4.34 (sq in) (silver collagen) 1 x Per Day/30 Days ary Discharge Instructions: Moisten collagen with hydrogel Prim Dressing: adaptic 1 x Per Day/30 Days ary Discharge Instructions: apply over the collagen. Secondary Dressing: Woven Gauze Sponge, Non-Sterile 4x4 in 1 x Per Day/30 Days Discharge Instructions: Apply x3 rolled gauze under the toe to aid in keeping the toe from bending causing friction/shear. Secondary Dressing: Woven Gauze Sponges 2x2 in (Generic) 1 x Per Day/30 Days Discharge Instructions: Apply over primary dressing as directed. Secured With: Insurance underwriter, Sterile 2x75 (in/in) (Generic) 1 x Per Day/30  Days Discharge Instructions: Secure with stretch gauze as directed. Secured With: 21M Medipore H Soft Cloth Surgical T ape, 4 x 10 (in/yd) (Generic) 1 x Per Day/30 Days Discharge Instructions: Secure with tape as directed. 1. I would recommend that we going continue with recommendation for collagen I think this is probably going to be the best way to go and the patient is in agreement with that plan. 2. Also can recommend that we have the patient continue with using hydrogel along with some of the Adaptic over top to try to keep this from drying out. 3. We will still be using the gauze rolled up underneath the toe to try to keep this held up and off so that it will not cause issues as well. We will see patient back for reevaluation in 1 week here in the clinic. If anything worsens or changes patient will contact our office for additional recommendations. If she is not doing better by next week working to get her back in the total contact cast. Electronic Signature(s) Signed: 03/21/2022 10:05:47 AM By: Lenda Kelp PA-C Previous Signature: 03/21/2022 10:05:00 AM Version By: Lenda Kelp PA-C Entered By: Lenda Kelp on 03/21/2022 10:05:47 -------------------------------------------------------------------------------- SuperBill Details Patient Name: Date of Service: Donna [name] 03/21/2022 Medical Record Number: [id number] Patient Account Number: [id number] Date of Birth/Sex: Treating RN: 08-Jan-1966 (56 y.o. Donna [name] Primary Care Provider: Hillard Danker Other Clinician: Referring Provider: Treating Provider/Extender: [name] in Treatment: 14 Diagnosis Coding ICD-10 Codes Code Description E11.621 Type 2 diabetes mellitus with foot ulcer L97.518 Non-pressure chronic ulcer of other part of right foot with other specified severity E11.42 Type 2 diabetes mellitus with diabetic polyneuropathy I10 Essential (primary) hypertension Facility  Procedures CPT4 Code: [id number] Description: 11042 - DEB SUBQ TISSUE 20 SQ CM/< ICD-10 Diagnosis Description L97.518 Non-pressure chronic ulcer of other part of right foot with other specified sev Modifier: erity Quantity: 1 Physician Procedures : CPT4 Code Description Modifier [id number] 11042 - WC PHYS SUBQ TISS 20 SQ CM ICD-10 Diagnosis Description L97.518 Non-pressure chronic ulcer of other part of right foot with other specified severity Quantity: 1 Electronic Signature(s) Signed: 03/21/2022 10:05:59 AM By: Lenda Kelp PA-C Entered By: Lenda Kelp on 03/21/2022 10:05:58

## 2022-03-21 NOTE — Progress Notes (Signed)
Donna [name], SELINGER ([id number]) Visit Report for 03/21/2022 Arrival Information Details Patient Name: Date of Service: Donna [name], Donna [name] 03/21/2022 9:00 A M Medical Record Number: [id number] Patient Account Number: [id number] Date of Birth/Sex: Treating RN: 12-Jun-1966 (56 y.o. [name], [name] Primary Care Osaze Hubbert: Pricilla Holm Other Clinician: Referring Blondina Coderre: Treating Kermitt Harjo/Extender: Dreama Saa in Treatment: 14 Visit Information History Since Last Visit Added or deleted any medications: No Patient Arrived: Ambulatory Any new allergies or adverse reactions: No Arrival Time: 09:19 Had a fall or experienced change in No Accompanied By: self activities of daily living that may affect Transfer Assistance: None risk of falls: Patient Identification Verified: Yes Signs or symptoms of abuse/neglect since last visito No Secondary Verification Process Completed: Yes Hospitalized since last visit: No Patient Requires Transmission-Based Precautions: No Implantable device outside of the clinic excluding No Patient Has Alerts: No cellular tissue based products placed in the center since last visit: Has Dressing in Place as Prescribed: Yes Has Footwear/Offloading in Place as Prescribed: Yes Right: Wedge Shoe Pain Present Now: No Electronic Signature(s) Signed: 03/21/2022 6:08:09 PM By: Deon Pilling RN, BSN Entered By: Deon Pilling on 03/21/2022 09:24:54 -------------------------------------------------------------------------------- Encounter Discharge Information Details Patient Name: Date of Service: Donna [name] 03/21/2022 9:00 [place] Record Number: [id number] Patient Account Number: [id number] Date of Birth/Sex: Treating RN: 1966/06/05 (56 y.o. [name] Primary Care Quintyn Dombek: Pricilla Holm Other Clinician: Referring Marica Trentham: Treating [place] Segler/Extender: Dreama Saa in Treatment:  14 Encounter Discharge Information Items Post Procedure Vitals Discharge Condition: Stable Temperature (F): 97.3 Ambulatory Status: Ambulatory Pulse (bpm): 73 Discharge Destination: Home Respiratory Rate (breaths/min): 20 Transportation: Private Auto Blood Pressure (mmHg): 143/79 Accompanied By: self Schedule Follow-up Appointment: Yes Clinical Summary of Care: Electronic Signature(s) Signed: 03/21/2022 6:08:09 PM By: Deon Pilling RN, BSN Entered By: Deon Pilling on 03/21/2022 09:58:16 -------------------------------------------------------------------------------- Lower Extremity Assessment Details Patient Name: Date of Service: Donna [name] 03/21/2022 9:00 A M Medical Record Number: [id number] Patient Account Number: [id number] Date of Birth/Sex: Treating RN: 1965-11-12 (56 y.o. [name] Primary Care Lekia Nier: Pricilla Holm Other Clinician: Referring Maddi Collar: Treating Eshaal Duby/Extender: Dreama Saa in Treatment: 14 Edema Assessment Assessed: [Left: No] Patrice Paradise: Yes] Edema: [Left: N] [Right: o] Calf Left: Right: Point of Measurement: 28 cm From Medial Instep 35 cm Ankle Left: Right: Point of Measurement: 8 cm From Medial Instep 22 cm Vascular Assessment Pulses: Dorsalis Pedis Palpable: [Right:Yes] Electronic Signature(s) Signed: 03/21/2022 6:08:09 PM By: Deon Pilling RN, BSN Entered By: Deon Pilling on 03/21/2022 09:25:10 -------------------------------------------------------------------------------- [name] Details Patient Name: Date of Service: Donna [name]. 03/21/2022 9:00 [place] Record Number: [id number] Patient Account Number: [id number] Date of Birth/Sex: Treating RN: 04-20-1966 (56 y.o. [name], [name] Primary Care Kamar Callender: Pricilla Holm Other Clinician: Referring Charbel Los: Treating Gerik Coberly/Extender: Dreama Saa in Treatment: 14 Active  Inactive Pain, Acute or Chronic Nursing Diagnoses: Pain, acute or chronic: actual or potential Potential alteration in comfort, pain Goals: Patient will verbalize adequate pain control and receive pain control interventions during procedures as needed Date Initiated: 12/13/2021 Target Resolution Date: 03/28/2022 Goal Status: Active Patient/caregiver will verbalize comfort level met Date Initiated: 12/13/2021 Date Inactivated: 03/14/2022 Target Resolution Date: 03/16/2022 Goal Status: Met Interventions: Encourage patient to take pain medications as prescribed Provide education on pain management Reposition patient for comfort Treatment Activities: Administer pain control measures as ordered : 12/13/2021 Notes: Wound/Skin Impairment Nursing Diagnoses: Knowledge deficit related to ulceration/compromised skin  integrity Goals: Patient/caregiver will verbalize understanding of skin care regimen Date Initiated: 12/13/2021 Target Resolution Date: 03/28/2022 Goal Status: Active Interventions: Assess patient/caregiver ability to obtain necessary supplies Assess patient/caregiver ability to perform ulcer/skin care regimen upon admission and as needed Provide education on ulcer and skin care Treatment Activities: Skin care regimen initiated : 12/13/2021 Topical wound management initiated : 12/13/2021 Notes: Electronic Signature(s) Signed: 03/21/2022 6:08:09 PM By: Deon Pilling RN, BSN Entered By: Deon Pilling on 03/21/2022 09:34:30 -------------------------------------------------------------------------------- Pain Assessment Details Patient Name: Date of Service: Donna [name] 03/21/2022 9:00 [place] Record Number: [id number] Patient Account Number: [id number] Date of Birth/Sex: Treating RN: [birth year] (56 y.o. [name] Primary Care Leyna Vanderkolk: Pricilla Holm Other Clinician: Referring Allora Bains: Treating Jonita Hirota/Extender: Dreama Saa in  Treatment: 14 Active Problems Location of Pain Severity and Description of Pain Patient Has Paino No Site Locations Rate the pain. Current Pain Level: 0 Pain Management and Medication Current Pain Management: Medication: No Cold Application: No Rest: No Massage: No Activity: No T.E.N.S.: No Heat Application: No Leg drop or elevation: No Is the Current Pain Management Adequate: Adequate How does your wound impact your activities of daily livingo Sleep: No Bathing: No Appetite: No Relationship With Others: No Bladder Continence: No Emotions: No Bowel Continence: No Work: No Toileting: No Drive: No Dressing: No Hobbies: No Engineer, maintenance) Signed: 03/21/2022 6:08:09 PM By: Deon Pilling RN, BSN Entered By: Deon Pilling on 03/21/2022 09:26:42 -------------------------------------------------------------------------------- Patient/Caregiver Education Details Patient Name: Date of Service: Donahoo, Charlane R. 8/2/2023andnbsp9:00 A M Medical Record Number: [id number] Patient Account Number: [id number] Date of Birth/Gender: Treating RN: Jul 31, 1966 (56 y.o. [name] Primary Care Physician: Pricilla Holm Other Clinician: Referring Physician: Treating Physician/Extender: Dreama Saa in Treatment: 14 Education Assessment Education Provided To: Patient Education Topics Provided Pain: Handouts: A Guide to Pain Control Methods: Explain/Verbal Responses: Reinforcements needed Electronic Signature(s) Signed: 03/21/2022 6:08:09 PM By: Deon Pilling RN, BSN Entered By: Deon Pilling on 03/21/2022 09:34:42 -------------------------------------------------------------------------------- Wound Assessment Details Patient Name: Date of Service: Donna [name] 03/21/2022 9:00 A M Medical Record Number: [id number] Patient Account Number: [id number] Date of Birth/Sex: Treating RN: 1966-08-02 (56 y.o. [name], Meta.Reding Primary Care  Fontaine Hehl: Pricilla Holm Other Clinician: Referring Daniya Aramburo: Treating Ipek Westra/Extender: Dreama Saa in Treatment: 14 Wound Status Wound Number: 2 Primary Etiology: Diabetic Wound/Ulcer of the Lower Extremity Wound Location: Right T Great oe Wound Status: Open Wounding Event: Blister Comorbid History: Hypertension, Type II Diabetes, Neuropathy Date Acquired: 11/22/2021 Weeks Of Treatment: 14 Clustered Wound: No Photos Wound Measurements Length: (cm) 0.1 Width: (cm) 0.1 Depth: (cm) 0.1 Area: (cm) 0.008 Volume: (cm) 0.001 % Reduction in Area: 99.8% % Reduction in Volume: 99.7% Epithelialization: Large (67-100%) Tunneling: No Undermining: No Wound Description Classification: Grade 2 Wound Margin: Distinct, outline attached Exudate Amount: None Present Foul Odor After Cleansing: No Slough/Fibrino No Wound Bed Granulation Amount: None Present (0%) Exposed Structure Necrotic Amount: None Present (0%) Fascia Exposed: No Fat Layer (Subcutaneous Tissue) Exposed: No Tendon Exposed: No Muscle Exposed: No Joint Exposed: No Bone Exposed: No Treatment Notes Wound #2 (Toe Great) Wound Laterality: Right Cleanser Normal Saline Discharge Instruction: Cleanse the wound with Normal Saline prior to applying a clean dressing using gauze sponges, not tissue or cotton balls. Peri-Wound Care Topical Primary Dressing Promogran Prisma Matrix, 4.34 (sq in) (silver collagen) Discharge Instruction: Moisten collagen with hydrogel adaptic Discharge Instruction: apply over the collagen. Secondary Dressing Woven Gauze Sponge, Non-Sterile 4x4  in Discharge Instruction: Apply x3 rolled gauze under the toe to aid in keeping the toe from bending causing friction/shear. Woven Gauze Sponges 2x2 in Discharge Instruction: Apply over primary dressing as directed. Secured With Conforming Stretch Gauze Bandage, Sterile 2x75 (in/in) Discharge Instruction: Secure  with stretch gauze as directed. 24M Medipore H Soft Cloth Surgical T ape, 4 x 10 (in/yd) Discharge Instruction: Secure with tape as directed. Compression Wrap Compression Stockings Add-Ons Electronic Signature(s) Signed: 03/21/2022 6:08:09 PM By: Deon Pilling RN, BSN Entered By: Deon Pilling on 03/21/2022 09:25:47 -------------------------------------------------------------------------------- Vitals Details Patient Name: Date of Service: Donna [name]. 03/21/2022 9:00 [place] Record Number: [id number] Patient Account Number: [id number] Date of Birth/Sex: Treating RN: [birth year] (56 y.o. [name], [name] Primary Care Dj Senteno: Pricilla Holm Other Clinician: Referring Dierdra Salameh: Treating Lakyn Mantione/Extender: Dreama Saa in Treatment: 14 Vital Signs Time Taken: 09:19 Temperature (F): 97.6 Height (in): 66 Pulse (bpm): 73 Weight (lbs): 220 Respiratory Rate (breaths/min): 20 Body Mass Index (BMI): 35.5 Blood Pressure (mmHg): 143/79 Reference Range: 80 - 120 mg / dl Electronic Signature(s) Signed: 03/21/2022 6:08:09 PM By: Deon Pilling RN, BSN Entered By: Deon Pilling on 03/21/2022 09:26:34

## 2022-03-22 ENCOUNTER — Encounter: Payer: 59 | Admitting: Internal Medicine

## 2022-03-28 ENCOUNTER — Encounter (HOSPITAL_BASED_OUTPATIENT_CLINIC_OR_DEPARTMENT_OTHER): Payer: 59 | Admitting: Physician Assistant

## 2022-03-28 DIAGNOSIS — E11621 Type 2 diabetes mellitus with foot ulcer: Secondary | ICD-10-CM | POA: Diagnosis not present

## 2022-03-28 DIAGNOSIS — E1142 Type 2 diabetes mellitus with diabetic polyneuropathy: Secondary | ICD-10-CM | POA: Diagnosis not present

## 2022-03-28 DIAGNOSIS — I10 Essential (primary) hypertension: Secondary | ICD-10-CM | POA: Diagnosis not present

## 2022-03-28 DIAGNOSIS — L97512 Non-pressure chronic ulcer of other part of right foot with fat layer exposed: Secondary | ICD-10-CM | POA: Diagnosis not present

## 2022-03-28 DIAGNOSIS — L97518 Non-pressure chronic ulcer of other part of right foot with other specified severity: Secondary | ICD-10-CM | POA: Diagnosis not present

## 2022-03-29 NOTE — Progress Notes (Signed)
Donna, [name] ([id number]) Visit Report for 03/28/2022 Arrival Information Details Patient Name: Date of Service: Donna [name], Donna [name] 03/28/2022 9:00 A M Medical Record Number: [id number] Patient Account Number: [id number] Date of Birth/Sex: Treating RN: 12/13/1965 (56 y.o. Donna [name], Donna [name] Primary Care Provider: Pricilla [name] Other Clinician: Referring Provider: Treating Provider/Extender: Verlene Mayer in Treatment: 15 Visit Information History Since Last Visit Added or deleted any medications: No Patient Arrived: Ambulatory Any new allergies or adverse reactions: No Arrival Time: 08:59 Had a fall or experienced change in No Accompanied By: self activities of daily living that may affect Transfer Assistance: None risk of falls: Patient Identification Verified: Yes Signs or symptoms of abuse/neglect since last visito No Secondary Verification Process Completed: Yes Hospitalized since last visit: No Patient Requires Transmission-Based Precautions: No Implantable device outside of the clinic excluding No Patient Has Alerts: No cellular tissue based products placed in the center since last visit: Has Dressing in Place as Prescribed: Yes Has Footwear/Offloading in Place as Prescribed: Yes Right: Wedge [name] Pain Present Now: No Electronic Signature(s) Signed: 03/28/2022 5:09:07 PM By: Donna Pilling RN, BSN Entered By: Donna [name] on 03/28/2022 08:59:45 -------------------------------------------------------------------------------- Encounter Discharge Information Details Patient Name: Date of Service: Donna [name]. 03/28/2022 9:00 [place] Record Number: [id number] Patient Account Number: [id number] Date of Birth/Sex: Treating RN: 08-28-1965 (56 y.o. Donna [name] Primary Care Provider: Pricilla [name] Other Clinician: Referring Provider: Treating Provider/Extender: Donna [name] in Treatment:  15 Encounter Discharge Information Items Post Procedure Vitals Discharge Condition: Stable Temperature (F): 98.3 Ambulatory Status: Ambulatory Pulse (bpm): 73 Discharge Destination: Home Respiratory Rate (breaths/min): 20 Transportation: Private Auto Blood Pressure (mmHg): 128/80 Accompanied By: self Schedule Follow-up Appointment: Yes Clinical Summary of Care: Electronic Signature(s) Signed: 03/28/2022 5:09:07 PM By: Donna Pilling RN, BSN Entered By: Donna [name] on 03/28/2022 10:13:31 -------------------------------------------------------------------------------- Lower Extremity Assessment Details Patient Name: Date of Service: Donna [name] 03/28/2022 9:00 [place] Record Number: [id number] Patient Account Number: [id number] Date of Birth/Sex: Treating RN: 1965/12/29 (56 y.o. Donna [name] Primary Care Provider: Pricilla [name] Other Clinician: Referring Provider: Treating Provider/Extender: Verlene Mayer in Treatment: 15 Edema Assessment Assessed: [Left: No] [Right: Yes] Edema: [Left: N] [Right: o] Calf Left: Right: Point of Measurement: 28 cm From Medial Instep 37 cm Ankle Left: Right: Point of Measurement: 8 cm From Medial Instep 23 cm Vascular Assessment Pulses: Dorsalis Pedis Palpable: [Right:Yes] Electronic Signature(s) Signed: 03/28/2022 5:09:07 PM By: Donna Pilling RN, BSN Entered By: Donna [name] on 03/28/2022 09:03:21 -------------------------------------------------------------------------------- [place] Details Patient Name: Date of Service: Donna [name] 03/28/2022 9:00 Donna [name] Record Number: [id number] Patient Account Number: [id number] Date of Birth/Sex: Treating RN: [birth year] (56 y.o. Donna [name], Donna [name] Primary Care Provider: Pricilla [name] Other Clinician: Referring Provider: Treating Provider/Extender: Verlene Mayer in Treatment:  15 Active Inactive Pain, Acute or Chronic Nursing Diagnoses: Pain, acute or chronic: actual or potential Potential alteration in comfort, pain Goals: Patient will verbalize adequate pain control and receive pain control interventions during procedures as needed Date Initiated: 12/13/2021 Target Resolution Date: 03/28/2022 Goal Status: Active Patient/caregiver will verbalize comfort level met Date Initiated: 12/13/2021 Date Inactivated: 03/14/2022 Target Resolution Date: 03/16/2022 Goal Status: Met Interventions: Encourage patient to take pain medications as prescribed Provide education on pain management Reposition patient for comfort Treatment Activities: Administer pain control measures as ordered : 12/13/2021 Notes: Wound/Skin Impairment Nursing Diagnoses: Knowledge deficit related to ulceration/compromised skin integrity Goals: Patient/caregiver  will verbalize understanding of skin care regimen Date Initiated: 12/13/2021 Target Resolution Date: 03/28/2022 Goal Status: Active Interventions: Assess patient/caregiver ability to obtain necessary supplies Assess patient/caregiver ability to perform ulcer/skin care regimen upon admission and as needed Provide education on ulcer and skin care Treatment Activities: Skin care regimen initiated : 12/13/2021 Topical wound management initiated : 12/13/2021 Notes: Electronic Signature(s) Signed: 03/28/2022 5:09:07 PM By: Donna Pilling RN, BSN Entered By: Donna [name] on 03/28/2022 09:04:56 -------------------------------------------------------------------------------- Pain Assessment Details Patient Name: Date of Service: Donna [name] 03/28/2022 9:00 A M Medical Record Number: [id number] Patient Account Number: [id number] Date of Birth/Sex: Treating RN: 1966/08/08 (56 y.o. Donna [name] Primary Care Donna [name]: Donna [name] Other Clinician: Referring Donna [name]: Treating Donna [name]/Extender: Verlene Mayer in Treatment: 15 Active Problems Location of Pain Severity and Description of Pain Patient Has Paino No Site Locations Rate the pain. Current Pain Level: 0 Pain Management and Medication Current Pain Management: Medication: No Cold Application: No Rest: No Massage: No Activity: No T.E.N.S.: No Heat Application: No Leg drop or elevation: No Is the Current Pain Management Adequate: Adequate How does your wound impact your activities of daily livingo Sleep: No Bathing: No Appetite: No Relationship With Others: No Bladder Continence: No Emotions: No Bowel Continence: No Work: No Toileting: No Drive: No Dressing: No Hobbies: No Engineer, maintenance) Signed: 03/28/2022 5:09:07 PM By: Donna Pilling RN, BSN Entered By: Donna [name] on 03/28/2022 09:01:35 -------------------------------------------------------------------------------- Patient/Caregiver Education Details Patient Name: Date of Service: [place]iev[place], Brittannie R. 8/9/2023andnbsp9:00 A M Medical Record Number: [id number] Patient Account Number: [id number] Date of Birth/Gender: Treating RN: 1966/05/19 (56 y.o. Donna [name] Primary Care Physician: Donna [name] Other Clinician: Referring Physician: Treating Physician/Extender: Verlene Mayer in Treatment: 15 Education Assessment Education Provided To: Patient Education Topics Provided Wound/Skin Impairment: Handouts: Skin Care Do's and Dont's Methods: Explain/Verbal Responses: Reinforcements needed Electronic Signature(s) Signed: 03/28/2022 5:09:07 PM By: Donna Pilling RN, BSN Entered By: Donna [name] on 03/28/2022 09:05:07 -------------------------------------------------------------------------------- Wound Assessment Details Patient Name: Date of Service: Donna [name] 03/28/2022 9:00 A M Medical Record Number: [id number] Patient Account Number: [id number] Date of Birth/Sex: Treating RN: [birth year]  (56 y.o. Donna [name], Meta.Reding Primary Care Raesean Bartoletti: Donna [name] Other Clinician: Referring Rosamaria Donn: Treating Kaylen Motl/Extender: Verlene Mayer in Treatment: 15 Wound Status Wound Number: 2 Primary Etiology: Diabetic Wound/Ulcer of the Lower Extremity Wound Location: Right T Great oe Wound Status: Open Wounding Event: Blister Comorbid History: Hypertension, Type II Diabetes, Neuropathy Date Acquired: 11/22/2021 Weeks Of Treatment: 15 Clustered Wound: No Photos Wound Measurements Length: (cm) 0.1 Width: (cm) 0.1 Depth: (cm) 0.1 Area: (cm) 0.008 Volume: (cm) 0.001 % Reduction in Area: 99.8% % Reduction in Volume: 99.7% Epithelialization: Large (67-100%) Tunneling: No Undermining: No Wound Description Classification: Grade 2 Wound Margin: Distinct, outline attached Exudate Amount: None Present Foul Odor After Cleansing: No Slough/Fibrino No Wound Bed Granulation Amount: None Present (0%) Exposed Structure Necrotic Amount: None Present (0%) Fascia Exposed: No Fat Layer (Subcutaneous Tissue) Exposed: No Tendon Exposed: No Muscle Exposed: No Joint Exposed: No Bone Exposed: No Assessment Notes callous over. Treatment Notes Wound #2 (Toe Great) Wound Laterality: Right Cleanser Normal Saline Discharge Instruction: Cleanse the wound with Normal Saline prior to applying a clean dressing using gauze sponges, not tissue or cotton balls. Peri-Wound Care Topical triple antibiotic ointment Discharge Instruction: apply directly to wound bed. Primary Dressing KerraCel Ag Gelling Fiber Dressing, 2x2 in (silver alginate) Discharge Instruction: Apply silver alginate over the triple antibiotic ointment.  Secondary Dressing Woven Gauze Sponge, Non-Sterile 4x4 in Discharge Instruction: Apply x3 rolled gauze under the toe to aid in keeping the toe from bending causing friction/shear. Woven Gauze Sponges 2x2 in Discharge Instruction: Apply over  primary dressing as directed. Secured With Conforming Stretch Gauze Bandage, Sterile 2x75 (in/in) Discharge Instruction: Secure with stretch gauze as directed. 29M Medipore H Soft Cloth Surgical T ape, 4 x 10 (in/yd) Discharge Instruction: Secure with tape as directed. Compression Wrap Compression Stockings Add-Ons Electronic Signature(s) Signed: 03/28/2022 5:09:07 PM By: Donna Pilling RN, BSN Signed: 03/29/2022 5:01:19 PM By: [name] RN Entered By: [name] on 03/28/2022 09:05:45 -------------------------------------------------------------------------------- Vitals Details Patient Name: Date of Service: Donna [name]. 03/28/2022 9:00 A M Medical Record Number: [id number] Patient Account Number: [id number] Date of Birth/Sex: Treating RN: 09-06-1965 (56 y.o. Donna [name], Donna [name] Primary Care Provider: Pricilla [name] Other Clinician: Referring Provider: Treating Provider/Extender: Verlene Mayer in Treatment: 15 Vital Signs Time Taken: 08:59 Temperature (F): 98.6 Height (in): 66 Pulse (bpm): 73 Weight (lbs): 220 Respiratory Rate (breaths/min): 20 Body Mass Index (BMI): 35.5 Blood Pressure (mmHg): 128/80 Reference Range: 80 - 120 mg / dl Electronic Signature(s) Signed: 03/28/2022 5:09:07 PM By: Donna Pilling RN, BSN Entered By: Donna [name] on 03/28/2022 09:01:26

## 2022-03-29 NOTE — Progress Notes (Signed)
Donna [name], Donna [name] ([id number]) Visit Report for 03/28/2022 Chief Complaint Document Details Patient Name: Date of Service: Donna [name], Donna [name] 03/28/2022 9:00 A M Medical Record Number: [id number] Patient Account Number: [id number] Date of Birth/Sex: Treating RN: 04-11-1966 (56 y.o. Donna [name] Primary Care Provider: Hillard [name] Other Clinician: Referring Provider: Treating Provider/Extender: Donna [name] in Treatment: 15 Information Obtained from: Patient Chief Complaint Right great toe ulcer Electronic Signature(s) Signed: 03/28/2022 10:17:49 AM By: Lenda Kelp PA-C Entered By: Lenda Kelp on 03/28/2022 10:17:48 -------------------------------------------------------------------------------- Debridement Details Patient Name: Date of Service: Donna [name]. 03/28/2022 9:00 A M Medical Record Number: [id number] Patient Account Number: [id number] Date of Birth/Sex: Treating RN: [birth year] (56 y.o. Donna [name], Donna [name] Primary Care Provider: Hillard [name] Other Clinician: Referring Provider: Treating Provider/Extender: Donna [name] in Treatment: 15 Debridement Performed for Assessment: Wound #2 Right T Great oe Performed By: Physician Lenda Kelp, PA Debridement Type: Debridement Severity of Tissue Pre Debridement: Limited to breakdown of skin Level of Consciousness (Pre-procedure): Awake and Alert Pre-procedure Verification/Time Out Yes - 10:00 Taken: Start Time: 10:01 Pain Control: Lidocaine 5% topical ointment T Area Debrided (L x W): otal 1 (cm) x 1 (cm) = 1 (cm) Tissue and other material debrided: Non-Viable, Callus, Slough, Subcutaneous, Slough Level: Skin/Subcutaneous Tissue Debridement Description: Excisional Instrument: Curette Bleeding: None Hemostasis Achieved: Pressure End Time: 10:09 Procedural Pain: 0 Post Procedural Pain: 0 Response to Treatment: Procedure was tolerated  well Level of Consciousness (Post- Awake and Alert procedure): Post Debridement Measurements of Total Wound Length: (cm) 0.1 Width: (cm) 0.1 Depth: (cm) 0.1 Volume: (cm) 0.001 Character of Wound/Ulcer Post Debridement: Improved Severity of Tissue Post Debridement: Fat layer exposed Post Procedure Diagnosis Same as Pre-procedure Electronic Signature(s) Signed: 03/28/2022 5:09:07 PM By: Shawn Stall RN, BSN Signed: 03/29/2022 3:04:24 PM By: Lenda Kelp PA-C Entered By: Shawn Stall on 03/28/2022 10:10:06 -------------------------------------------------------------------------------- HPI Details Patient Name: Date of Service: Donna [name]. 03/28/2022 9:00 A M Medical Record Number: [id number] Patient Account Number: [id number] Date of Birth/Sex: Treating RN: 07/31/1966 (56 y.o. 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 She tells me that since at least last summer she has had a progressive callused area that has opened closed but is never really healed. She has a history of calluses on her feet. She is a diabetic with neuropathy. She was referred to [facility] Allena Katz [facility] who felt that this might be a large plantar wart she was treated with laser for 2 treatments and then subsequently with ammonium lactate none of this is apparently made any difference according to the patient. She has a very odd looking area with thick skin and callus and an almost crossed like area of open tissue. She also has erythema above this which she says is chronic. The other interesting part of her recent history is recurrent cellulitis on the right lower leg although this is separated from the area we  are looking at. She has had trips to see infectious disease has been on Keflex for as long as 20 days. This is presumed to be strep. She saw Dr. Renold Don Past medical history includes type 2 diabetes, recurrent cellulitis of the right leg, right Achilles tendon repair a year ago  for spontaneous rupture ABI in our clinic was 1.09 on the right 2/17; patient admitted the clinic last week had a thick callused area on the right lateral heel she is a diabetic with neuropathy. She had thick callus and across like area of open wound. I aggressively debrided this area to remove most of the callus and gave her Hydrofera Blue and heel off loader, heel cup and she has been wearing this religiously trying to keep the pressure off the area. She comes in today with a cross-like area of injury has healed she has a new open area that I do not remember from last time. Still some dry flaking skin and callus but not nearly as much as last week at which time I had to use a scalpel to remove this. 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 She came in 2 weeks ago with a very thick hyperkeratotic callused area on her right lateral heel. There was a "crossed" shaped area of split tissue with an open wound at the bottom. This had previously been treated as a possible wart without improvement. I removed all of the callus when she first came in here. This is mostly epithelialized over still thick skin but nothing like it was. She has 1 small open area that has not healed she has been more religious about offloading the area 10/27/2020 upon evaluation today patient appears to be doing well with regard to her wound although she does still apparently appear to have something still open in the base of the wound. Fortunately there is no evidence of infection at this time which is great news and in general I feel like she is doing decently well though she has a lot of callus buildup she is using the offloading [name]  for her heel. At home she does not always wear this she tells me. Generally she is using flip-flops there. Of note her primary care provider did give her Keflex yesterday for cellulitis of this leg but has nothing to do with the heel location. 3/17; the patient's area actually looks a lot better. There is no open wound here. Much less callus. She was given a prescription for 40% urea cream I am not sure that really is required this week. She is going to require less friction on this area. I think she is going to need a different type of [name] going forward Readmission: 12-13-2028 upon evaluation today patient presents for reevaluation in the clinic she was last seen by Dr. Dellia Nims on 11-03-2020 when she was discharged this was due to a crack on her heel. Currently she is actually having an issue with her great toe on the right foot. She tells me that this has been present currently for a couple of weeks since around November 22, 2021. She has had a lot of callus over this area in general but not an open wound until that time. Subsequently this has more recently opened and has Become a greater concern she is also noted some odor which is disconcerting to her. She tells me that her greatest concern is that "she does not lose this toe". Again I completely understand her concern in this regard I definitely think we need to be aggressive in treating this and try to get things better. She is very Patent attorney. Patient does have a history of diabetes mellitus type 2 with diabetic neuropathy. She also tells me that she does have a history as well of hypertension which is pretty  well controlled. She has not yet had an x-ray of the area and has not been on any antibiotics. Both are things I think we will need to do today. 12-20-2021 upon evaluation today patient appears to be doing well with regard to her toe ulcer all things considered. She has been tolerating the dressing changes without complication. I do have her  x-ray as well as her culture for review today the culture did so Staphylococcus aureus no MRSA noted which was great news susceptible to doxycycline which is what I did place her on last week. Overall the odor that she was noticing has also cleared which is great news. With regard to the x-ray there does not appear to be any signs of osteomyelitis or bone erosion at this point which is also great news in general and very pleased with where things stand. 12-27-2021 upon evaluation today patient appears to be doing well with regard to her wound this is actually measuring significantly better which is great news. Overall I am extremely pleased with where we stand today. No fevers, chills, nausea, vomiting, or diarrhea. 01-03-2022 upon evaluation today patient's wound is actually showing signs of significant improvement which is also news. I am very pleased with what we are seeing there is a little bit of callus buildup still but we are going to work on that today otherwise she seems to be making excellent progress which is great news. 01-10-2022 upon evaluation today patient appears to be doing well currently in regard to her toe ulcer. She has been tolerating the dressing changes without complication. Fortunately there does not appear to be any evidence of active infection locally or systemically which is great news. No fevers, chills, nausea, vomiting, or diarrhea. 01-17-2022 upon evaluation today patient's toe was actually doing about the same this week there is not a tremendous improvement in size but overall the appearance of the wound is better it is much flatter I do think the [facility] has done well in that regard. We will still wait to hear back on the skin substitute. With that being said I do believe that the patient could benefit from again the skin substitute to try to help get this to heal much more effectively and quickly in the meantime I am trying to help flatten this out which I think  is doing a pretty good job.. 6/7; wound on the plantar right great toe over the distal phalanx. Wound is slightly smaller but looks healthier there is rims of epithelialization she still has some callus on the toe indicative of inadequate pressure/friction relief. I talked to her about making sure in the forefoot offloading boot that the weight stays on her heel in stance phase 01-31-2022 upon evaluation patient's wound is still continuing to show signs of really minimal progress unfortunately. I actually believe that she may need to go into the total contact cast in order to get this to heal and I discussed that with her today as well. Obviously this has been a little bit of a barrier for her not because she does not want to do it and does not want to get healed but rather because it is going to prevent her from being able to drive which is obviously going to be a significant quality-of-life issue with what she does in the position she is in at work. 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 I do believe she could  work with the cast on but the issue simply is that she cannot drive to get to work. Otherwise should be able to walk and do what she needs to do. 02-07-2022 upon evaluation today patient appears to be doing well with regard to her wounds. She has been tolerating the dressing changes without complication. Fortunately I do not see any signs of infection although the wound itself is not significantly smaller as of yet. This is something that we are trying to work on I do believe a total contact cast would be the right way to go. I discussed this with the patient today as well. She is actually in agreement with giving this a trial although she is a little nervous about it being that she is claustrophobic. 6/23; patient presents for obligatory cast exchange. She reports that her heel feels sore from the total contact cast. On that she  had no issues. 02-14-2022 upon evaluation today patient's wound is actually showing signs of excellent improvement. There does not appear to be any signs of active infection at this time and overall the toe looks amazing in just 1 week's time. We have come down almost half compared to where we were before size wise and this is also him as well. Overall I think that the casting has done extremely good for her and the toe in general looks significantly improved. I think this week since we want to have to mess with the wound bed itself just the callus around she will likely have even better improvement come next time. 02-21-2022 upon evaluation today patient appears to be doing well with regard to her toe. She is actually showing signs of improvement this is a little bit slower than she would like to see but nonetheless we are seeing significant changes in the right direction in my opinion. Fortunately she is also developing a lot less callus on the end of the toe which is helpful for her as well in my opinion. 02-28-2022 upon evaluation today patient appears to be doing well currently in regard to her wound. She has been tolerating the dressing changes. Fortunately I do not see any signs of infection I think the cast is doing an awesome job here. No fevers, chills, nausea, vomiting, or diarrhea. 03-07-2022 upon evaluation today patient appears to be doing well with regard to her wound although the collagen is very stuck and dry on the area on the tip of her toe at this point. Nonetheless I discussed with her that I am a little reluctant to put the cast on this week due to what appeared to been a blister along the left side of her toe laterally. Nonetheless this has been a little worried about the possibility of infection and I do not see anything obvious I think that we may not want to put the cast on this week just to be on the safe side she voiced understanding. With that being said if were not to use the  cast she is going to need to make sure to have still some pressure relief I think making gauze bolster with 3 gauze up underneath the toe is going to do a good job here. We will roll and tape this and then subsequently anchored underneath the big toe should be using the front offloading [name] as well. 7/26; patient I know from a previous stay in this clinic. Type II diabetic with a wound on her right plantar great toe. She was in a total contact cast to  2 weeks ago the wound was close to closed last week according to our intake nurse. We put Xeroform and gauze on this that the patient has been changing her self. She has a forefoot offloading boot. 03-21-2022 upon a variable evaluation today patient appears to be doing somewhat poorly in regard to callus buildup at this time. Fortunately I do not see any evidence of overall worsening but this is just still open. No fevers, chills, nausea, vomiting, or diarrhea. I think we may have to go back into the cast next week if this is not significantly improved by that time. 03-28-2022 upon evaluation today patient's wound is actually showing signs of significant improvement which is great news. The cast over the past week he has done a great job. This is so small however but still has a little bit of a deeper area that I am concerned about the possibility of it drying out too much under the cast that is what we saw today and callused over. Subsequently I did remove the callus. Electronic Signature(s) Signed: 03/28/2022 10:17:56 AM By: Donna Keeler PA-C Entered By: Donna [name] on 03/28/2022 10:17:55 -------------------------------------------------------------------------------- Physical Exam Details Patient Name: Date of Service: Donna [name], Donna [name] 03/28/2022 9:00 A M Medical Record Number: [id number] Patient Account Number: [id number] Date of Birth/Sex: Treating RN: 1965-12-17 (56 y.o. Donna [name] Primary Care Provider: Pricilla Holm Other  Clinician: Referring Provider: Treating Provider/Extender: Donna [name] in Treatment: 15 Constitutional Well-nourished and well-hydrated in no acute distress. Respiratory normal breathing without difficulty. Psychiatric this patient is able to make decisions and demonstrates good insight into disease process. Alert and Oriented x 3. pleasant and cooperative. Notes Upon inspection patient's wound bed actually showed signs of good granulation and epithelization I did perform some debridement to clear away some of the necrotic debris postdebridement the wound bed appears to be doing significantly better which is great news. No fevers, chills, nausea, vomiting, or diarrhea. Electronic Signature(s) Signed: 03/28/2022 10:18:12 AM By: Donna Keeler PA-C Entered By: Donna [name] on 03/28/2022 10:18:12 -------------------------------------------------------------------------------- Physician Orders Details Patient Name: Date of Service: Donna [name], Donna [name] 03/28/2022 9:00 A M Medical Record Number: [id number] Patient Account Number: [id number] Date of Birth/Sex: Treating RN: August 22, 1965 (56 y.o. Donna [name] Primary Care Provider: Pricilla Holm Other Clinician: Referring Provider: Treating Provider/Extender: Donna [name] in Treatment: 15 Verbal / Phone Orders: No Diagnosis Coding ICD-10 Coding Code Description E11.621 Type 2 diabetes mellitus with foot ulcer L97.518 Non-pressure chronic ulcer of other part of right foot with other specified severity E11.42 Type 2 diabetes mellitus with diabetic polyneuropathy I10 Essential (primary) hypertension Follow-up Appointments ppointment in 1 week. Jeri Cos, PA and [place], Room 6 overflow Wednesday 04/04/2022 0800 Return A Cellular or Tissue Based Products Wound #2 Right T Great oe Cellular or Tissue Based Product Type: - 01/10/2022 [employer] run insurance  authorization 01/17/2022-pending 6/7/223 pending 01/31/2022 approved with high copay. Bathing/ Shower/ Hygiene May shower with protection but do not get wound dressing(s) wet. - May purchase cast protector from [facility], [facility], or [employer] Edema Control - Lymphedema / SCD / Other Elevate legs to the level of the heart or above for 30 minutes daily and/or when sitting, a frequency of: - 3-4 times a day throughout the day. Avoid standing for long periods of time. Off-Loading Wedge [name] to: - right foot while walking and standing. limit walking and standing. Wound Treatment Wound #2 - T Great oe Wound Laterality:  Right Cleanser: Normal Saline 1 x Per Day/30 Days Discharge Instructions: Cleanse the wound with Normal Saline prior to applying a clean dressing using gauze sponges, not tissue or cotton balls. Topical: triple antibiotic ointment 1 x Per Day/30 Days Discharge Instructions: apply directly to wound bed. Prim Dressing: KerraCel Ag Gelling Fiber Dressing, 2x2 in (silver alginate) 1 x Per Day/30 Days ary Discharge Instructions: Apply silver alginate over the triple antibiotic ointment. Secondary Dressing: Woven Gauze Sponge, Non-Sterile 4x4 in 1 x Per Day/30 Days Discharge Instructions: Apply x3 rolled gauze under the toe to aid in keeping the toe from bending causing friction/shear. Secondary Dressing: Woven Gauze Sponges 2x2 in (Generic) 1 x Per Day/30 Days Discharge Instructions: Apply over primary dressing as directed. Secured With: Child psychotherapist, Sterile 2x75 (in/in) (Generic) 1 x Per Day/30 Days Discharge Instructions: Secure with stretch gauze as directed. Secured With: 41M Medipore H Soft Cloth Surgical T ape, 4 x 10 (in/yd) (Generic) 1 x Per Day/30 Days Discharge Instructions: Secure with tape as directed. Electronic Signature(s) Signed: 03/28/2022 5:09:07 PM By: Deon Pilling RN, BSN Signed: 03/29/2022 3:04:24 PM By: Donna Keeler PA-C Entered By: Deon Pilling  on 03/28/2022 10:12:32 -------------------------------------------------------------------------------- Problem List Details Patient Name: Date of Service: Donna [name]. 03/28/2022 9:00 A M Medical Record Number: [id number] Patient Account Number: [id number] Date of Birth/Sex: Treating RN: 1966/05/07 (56 y.o. Donna [name], Donna [name] Primary Care Provider: Pricilla Holm Other Clinician: Referring Provider: Treating Provider/Extender: Donna [name] in Treatment: 15 Active Problems ICD-10 Encounter Code Description Active Date MDM Diagnosis E11.621 Type 2 diabetes mellitus with foot ulcer 12/13/2021 No Yes L97.518 Non-pressure chronic ulcer of other part of right foot with other specified 03/14/2022 No Yes severity E11.42 Type 2 diabetes mellitus with diabetic polyneuropathy 12/13/2021 No Yes I10 Essential (primary) hypertension 12/13/2021 No Yes Inactive Problems ICD-10 Code Description Active Date Inactive Date L97.812 Non-pressure chronic ulcer of other part of right lower leg with fat layer exposed 12/13/2021 12/13/2021 Resolved Problems Electronic Signature(s) Signed: 03/28/2022 10:17:43 AM By: Donna Keeler PA-C Entered By: Donna [name] on 03/28/2022 10:17:43 -------------------------------------------------------------------------------- Progress Note Details Patient Name: Date of Service: Donna [name]. 03/28/2022 9:00 A M Medical Record Number: [id number] Patient Account Number: [id number] Date of Birth/Sex: Treating RN: 1965/12/23 (56 y.o. 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 She tells me that since at least last summer she has had a progressive callused area that has opened closed but is never really healed. She has a history of calluses on her feet. She is a diabetic with neuropathy. She was referred to Dr. Posey Pronto at [facility] who felt that this might be a large plantar wart she was treated with laser for 2 treatments and then subsequently with ammonium lactate none of this is apparently made any difference according to the patient. She has a very odd looking area with thick skin and callus and an almost crossed like area of open tissue. She also has erythema above this which she says is chronic. The other interesting part of her recent history is recurrent cellulitis on the right lower leg although this is separated from the area we are looking at. She has  had trips to see infectious disease has been on Keflex for as long as 20 days. This is presumed to be strep. She saw Dr. Gale Journey Past medical history includes type 2 diabetes, recurrent cellulitis of the right leg, right Achilles tendon repair a year ago for spontaneous rupture ABI in our clinic was 1.09 on the right 2/17; patient admitted the clinic last week had a thick callused area on the right lateral heel she is a diabetic with neuropathy. She had thick callus and across like area of open wound. I aggressively debrided this area to remove most of the callus and gave her Hydrofera Blue and heel off loader, heel cup and she has been wearing this religiously trying to keep the pressure off the area. She comes in today with a cross-like area of injury has healed she has a new open area that I do not remember from last time. Still some dry flaking skin and callus but not nearly as much as last week at which time I had to use a scalpel to remove this. 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 She came in 2 weeks ago with a very thick hyperkeratotic callused area on her right  lateral heel. There was a "crossed" shaped area of split tissue with an open wound at the bottom. This had previously been treated as a possible wart without improvement. I removed all of the callus when she first came in here. This is mostly epithelialized over still thick skin but nothing like it was. She has 1 small open area that has not healed she has been more religious about offloading the area 10/27/2020 upon evaluation today patient appears to be doing well with regard to her wound although she does still apparently appear to have something still open in the base of the wound. Fortunately there is no evidence of infection at this time which is great news and in general I feel like she is doing decently well though she has a lot of callus buildup she is using the offloading [name] for her heel. At home she does not always wear this she tells me. Generally she is using flip-flops there. Of note her primary care provider did give her Keflex yesterday for cellulitis of this leg but has nothing to do with the heel location. 3/17; the patient's area actually looks a lot better. There is no open wound here. Much less callus. She was given a prescription for 40% urea cream I am not sure that really is required this week. She is going to require less friction on this area. I think she is going to need a different type of [name] going forward Readmission: 12-13-2028 upon evaluation today patient presents for reevaluation in the clinic she was last seen by Dr. Dellia Nims on 11-03-2020 when she was discharged this was due to a crack on her heel. Currently she is actually having an issue with her great toe on the right foot. She tells me that this has been present currently for a couple of weeks since around November 22, 2021. She has had a lot of callus over this area in general but not an open wound until that time. Subsequently this has more recently opened and has Become a greater concern she is also noted some odor  which is disconcerting to her. She tells me that her greatest concern is that "she does not lose this toe". Again I completely understand her concern in this regard I definitely think we  need to be aggressive in treating this and try to get things better. She is very Patent attorney. Patient does have a history of diabetes mellitus type 2 with diabetic neuropathy. She also tells me that she does have a history as well of hypertension which is pretty well controlled. She has not yet had an x-ray of the area and has not been on any antibiotics. Both are things I think we will need to do today. 12-20-2021 upon evaluation today patient appears to be doing well with regard to her toe ulcer all things considered. She has been tolerating the dressing changes without complication. I do have her x-ray as well as her culture for review today the culture did so Staphylococcus aureus no MRSA noted which was great news susceptible to doxycycline which is what I did place her on last week. Overall the odor that she was noticing has also cleared which is great news. With regard to the x-ray there does not appear to be any signs of osteomyelitis or bone erosion at this point which is also great news in general and very pleased with where things stand. 12-27-2021 upon evaluation today patient appears to be doing well with regard to her wound this is actually measuring significantly better which is great news. Overall I am extremely pleased with where we stand today. No fevers, chills, nausea, vomiting, or diarrhea. 01-03-2022 upon evaluation today patient's wound is actually showing signs of significant improvement which is also news. I am very pleased with what we are seeing there is a little bit of callus buildup still but we are going to work on that today otherwise she seems to be making excellent progress which is great news. 01-10-2022 upon evaluation today patient appears to be doing well currently in regard to her toe  ulcer. She has been tolerating the dressing changes without complication. Fortunately there does not appear to be any evidence of active infection locally or systemically which is great news. No fevers, chills, nausea, vomiting, or diarrhea. 01-17-2022 upon evaluation today patient's toe was actually doing about the same this week there is not a tremendous improvement in size but overall the appearance of the wound is better it is much flatter I do think the [facility] has done well in that regard. We will still wait to hear back on the skin substitute. With that being said I do believe that the patient could benefit from again the skin substitute to try to help get this to heal much more effectively and quickly in the meantime I am trying to help flatten this out which I think is doing a pretty good job.. 6/7; wound on the plantar right great toe over the distal phalanx. Wound is slightly smaller but looks healthier there is rims of epithelialization she still has some callus on the toe indicative of inadequate pressure/friction relief. I talked to her about making sure in the forefoot offloading boot that the weight stays on her heel in stance phase 01-31-2022 upon evaluation patient's wound is still continuing to show signs of really minimal progress unfortunately. I actually believe that she may need to go into the total contact cast in order to get this to heal and I discussed that with her today as well. Obviously this has been a little bit of a barrier for her not because she does not want to do it and does not want to get healed but rather because it is going to prevent her from being able to drive which  is obviously going to be a significant quality-of-life issue with what she does in the position she is in at work. 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 I do believe she could work with the cast on but the issue simply is  that she cannot drive to get to work. Otherwise should be able to walk and do what she needs to do. 02-07-2022 upon evaluation today patient appears to be doing well with regard to her wounds. She has been tolerating the dressing changes without complication. Fortunately I do not see any signs of infection although the wound itself is not significantly smaller as of yet. This is something that we are trying to work on I do believe a total contact cast would be the right way to go. I discussed this with the patient today as well. She is actually in agreement with giving this a trial although she is a little nervous about it being that she is claustrophobic. 6/23; patient presents for obligatory cast exchange. She reports that her heel feels sore from the total contact cast. On that she had no issues. 02-14-2022 upon evaluation today patient's wound is actually showing signs of excellent improvement. There does not appear to be any signs of active infection at this time and overall the toe looks amazing in just 1 week's time. We have come down almost half compared to where we were before size wise and this is also him as well. Overall I think that the casting has done extremely good for her and the toe in general looks significantly improved. I think this week since we want to have to mess with the wound bed itself just the callus around she will likely have even better improvement come next time. 02-21-2022 upon evaluation today patient appears to be doing well with regard to her toe. She is actually showing signs of improvement this is a little bit slower than she would like to see but nonetheless we are seeing significant changes in the right direction in my opinion. Fortunately she is also developing a lot less callus on the end of the toe which is helpful for her as well in my opinion. 02-28-2022 upon evaluation today patient appears to be doing well currently in regard to her wound. She has been  tolerating the dressing changes. Fortunately I do not see any signs of infection I think the cast is doing an awesome job here. No fevers, chills, nausea, vomiting, or diarrhea. 03-07-2022 upon evaluation today patient appears to be doing well with regard to her wound although the collagen is very stuck and dry on the area on the tip of her toe at this point. Nonetheless I discussed with her that I am a little reluctant to put the cast on this week due to what appeared to been a blister along the left side of her toe laterally. Nonetheless this has been a little worried about the possibility of infection and I do not see anything obvious I think that we may not want to put the cast on this week just to be on the safe side she voiced understanding. With that being said if were not to use the cast she is going to need to make sure to have still some pressure relief I think making gauze bolster with 3 gauze up underneath the toe is going to do a good job here. We will roll and tape this and then  subsequently anchored underneath the big toe should be using the front offloading [name] as well. 7/26; patient I know from a previous stay in this clinic. Type II diabetic with a wound on her right plantar great toe. She was in a total contact cast to 2 weeks ago the wound was close to closed last week according to our intake nurse. We put Xeroform and gauze on this that the patient has been changing her self. She has a forefoot offloading boot. 03-21-2022 upon a variable evaluation today patient appears to be doing somewhat poorly in regard to callus buildup at this time. Fortunately I do not see any evidence of overall worsening but this is just still open. No fevers, chills, nausea, vomiting, or diarrhea. I think we may have to go back into the cast next week if this is not significantly improved by that time. 03-28-2022 upon evaluation today patient's wound is actually showing signs of significant improvement which  is great news. The cast over the past week he has done a great job. This is so small however but still has a little bit of a deeper area that I am concerned about the possibility of it drying out too much under the cast that is what we saw today and callused over. Subsequently I did remove the callus. Objective Constitutional Well-nourished and well-hydrated in no acute distress. Vitals Time Taken: 8:59 AM, Height: 66 in, Weight: 220 lbs, BMI: 35.5, Temperature: 98.6 F, Pulse: 73 bpm, Respiratory Rate: 20 breaths/min, Blood Pressure: 128/80 mmHg. Respiratory normal breathing without difficulty. Psychiatric this patient is able to make decisions and demonstrates good insight into disease process. Alert and Oriented x 3. pleasant and cooperative. General Notes: Upon inspection patient's wound bed actually showed signs of good granulation and epithelization I did perform some debridement to clear away some of the necrotic debris postdebridement the wound bed appears to be doing significantly better which is great news. No fevers, chills, nausea, vomiting, or diarrhea. Integumentary (Hair, Skin) Wound #2 status is Open. Original cause of wound was Blister. The date acquired was: 11/22/2021. The wound has been in treatment 15 weeks. The wound is located on the Right T Great. The wound measures 0.1cm length x 0.1cm width x 0.1cm depth; 0.008cm^2 area and 0.001cm^3 volume. There is no tunneling oe or undermining noted. There is a none present amount of drainage noted. The wound margin is distinct with the outline attached to the wound base. There is no granulation within the wound bed. There is no necrotic tissue within the wound bed. General Notes: callous over. Assessment Active Problems ICD-10 Type 2 diabetes mellitus with foot ulcer Non-pressure chronic ulcer of other part of right foot with other specified severity Type 2 diabetes mellitus with diabetic polyneuropathy Essential (primary)  hypertension Procedures Wound #2 Pre-procedure diagnosis of Wound #2 is a Diabetic Wound/Ulcer of the Lower Extremity located on the Right T Great .Severity of Tissue Pre Debridement is: oe Limited to breakdown of skin. There was a Excisional Skin/Subcutaneous Tissue Debridement with a total area of 1 sq cm performed by Lenda Kelp, PA. With the following instrument(s): Curette to remove Non-Viable tissue/material. Material removed includes Callus, Subcutaneous Tissue, and Slough after achieving pain control using Lidocaine 5% topical ointment. A time out was conducted at 10:00, prior to the start of the procedure. There was no bleeding. The procedure was tolerated well with a pain level of 0 throughout and a pain level of 0 following the procedure. Post Debridement Measurements: 0.1cm  length x 0.1cm width x 0.1cm depth; 0.001cm^3 volume. Character of Wound/Ulcer Post Debridement is improved. Severity of Tissue Post Debridement is: Fat layer exposed. Post procedure Diagnosis Wound #2: Same as Pre-Procedure Plan Follow-up Appointments: Return Appointment in 1 week. Jeri Cos, PA and [place], Room 6 overflow Wednesday 04/04/2022 0800 Cellular or Tissue Based Products: Wound #2 Right T Great: oe Cellular or Tissue Based Product Type: - 01/10/2022 Epifix run insurance authorization 01/17/2022-pending 6/7/223 pending 01/31/2022 approved with high copay. Bathing/ Shower/ Hygiene: May shower with protection but do not get wound dressing(s) wet. - May purchase cast protector from [facility], [facility], or [employer] Edema Control - Lymphedema / SCD / Other: Elevate legs to the level of the heart or above for 30 minutes daily and/or when sitting, a frequency of: - 3-4 times a day throughout the day. Avoid standing for long periods of time. Off-Loading: Wedge [name] to: - right foot while walking and standing. limit walking and standing. WOUND #2: - T Great Wound Laterality: Right oe Cleanser: Normal Saline 1  x Per Day/30 Days Discharge Instructions: Cleanse the wound with Normal Saline prior to applying a clean dressing using gauze sponges, not tissue or cotton balls. Topical: triple antibiotic ointment 1 x Per Day/30 Days Discharge Instructions: apply directly to wound bed. Prim Dressing: KerraCel Ag Gelling Fiber Dressing, 2x2 in (silver alginate) 1 x Per Day/30 Days ary Discharge Instructions: Apply silver alginate over the triple antibiotic ointment. Secondary Dressing: Woven Gauze Sponge, Non-Sterile 4x4 in 1 x Per Day/30 Days Discharge Instructions: Apply x3 rolled gauze under the toe to aid in keeping the toe from bending causing friction/shear. Secondary Dressing: Woven Gauze Sponges 2x2 in (Generic) 1 x Per Day/30 Days Discharge Instructions: Apply over primary dressing as directed. Secured With: Child psychotherapist, Sterile 2x75 (in/in) (Generic) 1 x Per Day/30 Days Discharge Instructions: Secure with stretch gauze as directed. Secured With: 72M Medipore H Soft Cloth Surgical T ape, 4 x 10 (in/yd) (Generic) 1 x Per Day/30 Days Discharge Instructions: Secure with tape as directed. 1. I am going to recommend that we go ahead and continue continue to monitor for any signs of infection. Obviously anything changes with the wound care measures as before and the patient is in agreement with plan. This includes the use of the [facility] dressing which I think is still doing a great job here. 2. I am also can recommend that we have the patient she should contact the office and let me know. Otherwise we will see where she stands in 1 week's time hopefully this will be healed if not very close to. We will see patient back for reevaluation in 1 week here in the clinic. If anything worsens or changes patient will contact our office for additional recommendations. Electronic Signature(s) Signed: 03/28/2022 10:18:49 AM By: Donna Keeler PA-C Entered By: Donna [name] on 03/28/2022  10:18:49 -------------------------------------------------------------------------------- SuperBill Details Patient Name: Date of Service: MADAILEIN, DEGUIRE 03/28/2022 Medical Record Number: [id number] Patient Account Number: [id number] Date of Birth/Sex: Treating RN: 1966/01/02 (56 y.o. Donna [name] Primary Care Provider: Pricilla Holm Other Clinician: Referring Provider: Treating Provider/Extender: Donna [name] in Treatment: 15 Diagnosis Coding ICD-10 Codes Code Description E11.621 Type 2 diabetes mellitus with foot ulcer L97.518 Non-pressure chronic ulcer of other part of right foot with other specified severity E11.42 Type 2 diabetes mellitus with diabetic polyneuropathy I10 Essential (primary) hypertension Facility Procedures CPT4 Code: [id number] Description: [id number] - DEB SUBQ TISSUE 20  SQ CM/< ICD-10 Diagnosis Description L97.518 Non-pressure chronic ulcer of other part of right foot with other specified sev Modifier: erity Quantity: 1 Physician Procedures : CPT4 Code Description Modifier [id number] - WC PHYS SUBQ TISS 20 SQ CM ICD-10 Diagnosis Description L97.518 Non-pressure chronic ulcer of other part of right foot with other specified severity Quantity: 1 Electronic Signature(s) Signed: 03/28/2022 10:18:59 AM By: Donna Keeler PA-C Entered By: Donna [name] on 03/28/2022 10:18:58

## 2022-04-03 ENCOUNTER — Encounter: Payer: 59 | Admitting: Internal Medicine

## 2022-04-04 ENCOUNTER — Encounter (HOSPITAL_BASED_OUTPATIENT_CLINIC_OR_DEPARTMENT_OTHER): Payer: 59 | Admitting: Physician Assistant

## 2022-04-10 ENCOUNTER — Other Ambulatory Visit (HOSPITAL_COMMUNITY): Payer: Self-pay

## 2022-04-11 ENCOUNTER — Encounter (HOSPITAL_BASED_OUTPATIENT_CLINIC_OR_DEPARTMENT_OTHER): Payer: 59 | Admitting: Physician Assistant

## 2022-04-11 DIAGNOSIS — E11621 Type 2 diabetes mellitus with foot ulcer: Secondary | ICD-10-CM | POA: Diagnosis not present

## 2022-04-11 DIAGNOSIS — E1142 Type 2 diabetes mellitus with diabetic polyneuropathy: Secondary | ICD-10-CM | POA: Diagnosis not present

## 2022-04-11 DIAGNOSIS — L97512 Non-pressure chronic ulcer of other part of right foot with fat layer exposed: Secondary | ICD-10-CM | POA: Diagnosis not present

## 2022-04-11 DIAGNOSIS — L97518 Non-pressure chronic ulcer of other part of right foot with other specified severity: Secondary | ICD-10-CM | POA: Diagnosis not present

## 2022-04-11 DIAGNOSIS — I10 Essential (primary) hypertension: Secondary | ICD-10-CM | POA: Diagnosis not present

## 2022-04-11 NOTE — Progress Notes (Addendum)
Donna [name], Donna [name] ([id number]) Visit Report for 04/11/2022 Chief Complaint Document Details Patient Name: Date of Service: Donna [name] 04/11/2022 2:15 PM Medical Record Number: [id number] Patient Account Number: [id number] Date of Birth/Sex: Treating RN: 09-Mar-1966 (56 y.o. [name] Primary Care Provider: Hillard Danker Other Clinician: Referring Provider: Treating Provider/Extender: [name] in Treatment: 17 Information Obtained from: Patient Chief Complaint Right great toe ulcer Electronic Signature(s) Signed: 04/11/2022 2:27:20 PM By: Lenda Kelp PA-C Entered By: Lenda Kelp on 04/11/2022 14:27:20 -------------------------------------------------------------------------------- Debridement Details Patient Name: Date of Service: Donna, [name] 04/11/2022 2:15 PM Medical Record Number: [id number] Patient Account Number: [id number] Date of Birth/Sex: Treating RN: 1965/12/09 (56 y.o. [name], [name] Primary Care Provider: Hillard Danker Other Clinician: Referring Provider: Treating Provider/Extender: [name] in Treatment: 17 Debridement Performed for Assessment: Wound #2 Right T Great oe Performed By: Physician Lenda Kelp, PA Debridement Type: Debridement Severity of Tissue Pre Debridement: Fat layer exposed Level of Consciousness (Pre-procedure): Awake and Alert Pre-procedure Verification/Time Out Yes - 14:45 Taken: Start Time: 14:46 Pain Control: Lidocaine 4% Topical Solution T Area Debrided (L x W): otal 3 (cm) x 3 (cm) = 9 (cm) Tissue and other material debrided: Non-Viable, Callus Level: Non-Viable Tissue Debridement Description: Selective/Open Wound Instrument: Curette Bleeding: Minimum Hemostasis Achieved: Pressure End Time: 14:52 Procedural Pain: 0 Post Procedural Pain: 0 Response to Treatment: Procedure was tolerated well Level of Consciousness (Post-  Awake and Alert procedure): Post Debridement Measurements of Total Wound Length: (cm) 0.1 Width: (cm) 0.1 Depth: (cm) 0.1 Volume: (cm) 0.001 Character of Wound/Ulcer Post Debridement: Improved Severity of Tissue Post Debridement: Fat layer exposed Post Procedure Diagnosis Same as Pre-procedure Electronic Signature(s) Signed: 04/11/2022 4:23:52 PM By: Lenda Kelp PA-C Signed: 04/11/2022 5:54:03 PM By: Shawn Stall RN, BSN Entered By: Shawn Stall on 04/11/2022 14:52:48 -------------------------------------------------------------------------------- HPI Details Patient Name: Date of Service: Donna [name] 04/11/2022 2:15 PM Medical Record Number: [id number] Patient Account Number: [id number] Date of Birth/Sex: Treating RN: 07-14-1966 (56 y.o. 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 She tells me that since at least last summer she has had a progressive callused area that has opened closed but is never really healed. She has a history of calluses on her feet. She is a diabetic with neuropathy. She was referred to Dr. Allena Katz at [facility] who felt that this might be a large plantar wart she was treated with laser for 2 treatments and then subsequently with ammonium lactate none of this is apparently made any difference according to the patient. She has a very odd looking area with thick skin and callus and an almost crossed like area of open tissue. She also has erythema above this which she says is chronic. The other interesting part of her recent history is recurrent cellulitis on the right lower leg although this is separated from the area we are looking at. She has had trips to  see infectious disease has been on Keflex for as long as 20 days. This is presumed to be strep. She saw Dr. Renold Don Past medical history includes type 2 diabetes, recurrent cellulitis of the right leg, right Achilles tendon repair a year ago for spontaneous rupture ABI in our clinic  was 1.09 on the right 2/17; patient admitted the clinic last week had a thick callused area on the right lateral heel she is a diabetic with neuropathy. She had thick callus and across like area of open wound. I aggressively debrided this area to remove most of the callus and gave her Hydrofera Blue and heel off loader, heel cup and she has been wearing this religiously trying to keep the pressure off the area. She comes in today with a cross-like area of injury has healed she has a new open area that I do not remember from last time. Still some dry flaking skin and callus but not nearly as much as last week at which time I had to use a scalpel to remove this. 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 She came in 2 weeks ago with a very thick hyperkeratotic callused area on her right lateral heel. There was a "crossed" shaped area of split tissue with an open wound at the bottom. This had previously been treated as a possible wart without improvement. I removed all of the callus when she first came in here. This is mostly epithelialized over still thick skin but nothing like it was. She has 1 small open area that has not healed she has been more religious about offloading the area 10/27/2020 upon evaluation today patient appears to be doing well with regard to her wound although she does still apparently appear to have something still open in the base of the wound. Fortunately there is no evidence of infection at this time which is great news and in general I feel like she is doing decently well though she has a lot of callus buildup she is using the offloading shoe for her heel. At home she does not  always wear this she tells me. Generally she is using flip-flops there. Of note her primary care provider did give her Keflex yesterday for cellulitis of this leg but has nothing to do with the heel location. 3/17; the patient's area actually looks a lot better. There is no open wound here. Much less callus. She was given a prescription for 40% urea cream I am not sure that really is required this week. She is going to require less friction on this area. I think she is going to need a different type of shoe going forward Readmission: 12-13-2028 upon evaluation today patient presents for reevaluation in the clinic she was last seen by Dr. Leanord Hawking on 11-03-2020 when she was discharged this was due to a crack on her heel. Currently she is actually having an issue with her great toe on the right foot. She tells me that this has been present currently for a couple of weeks since around November 22, 2021. She has had a lot of callus over this area in general but not an open wound until that time. Subsequently this has more recently opened and has Become a greater concern she is also noted some odor which is disconcerting to her. She tells me that her greatest concern is that "she does not lose this toe". Again I completely understand her concern in this regard I definitely think we need to be aggressive in treating this and try to get things better. She is very Adult nurse. Patient does have a history of diabetes mellitus type 2 with diabetic neuropathy. She also tells me that she does have a history as well of hypertension which is pretty well controlled. She has not yet had  an x-ray of the area and has not been on any antibiotics. Both are things I think we will need to do today. 12-20-2021 upon evaluation today patient appears to be doing well with regard to her toe ulcer all things considered. She has been tolerating the dressing changes without complication. I do have her x-ray as well as her culture for review  today the culture did so Staphylococcus aureus no MRSA noted which was great news susceptible to doxycycline which is what I did place her on last week. Overall the odor that she was noticing has also cleared which is great news. With regard to the x-ray there does not appear to be any signs of osteomyelitis or bone erosion at this point which is also great news in general and very pleased with where things stand. 12-27-2021 upon evaluation today patient appears to be doing well with regard to her wound this is actually measuring significantly better which is great news. Overall I am extremely pleased with where we stand today. No fevers, chills, nausea, vomiting, or diarrhea. 01-03-2022 upon evaluation today patient's wound is actually showing signs of significant improvement which is also news. I am very pleased with what we are seeing there is a little bit of callus buildup still but we are going to work on that today otherwise she seems to be making excellent progress which is great news. 01-10-2022 upon evaluation today patient appears to be doing well currently in regard to her toe ulcer. She has been tolerating the dressing changes without complication. Fortunately there does not appear to be any evidence of active infection locally or systemically which is great news. No fevers, chills, nausea, vomiting, or diarrhea. 01-17-2022 upon evaluation today patient's toe was actually doing about the same this week there is not a tremendous improvement in size but overall the appearance of the wound is better it is much flatter I do think the [facility] has done well in that regard. We will still wait to hear back on the skin substitute. With that being said I do believe that the patient could benefit from again the skin substitute to try to help get this to heal much more effectively and quickly in the meantime I am trying to help flatten this out which I think is doing a pretty good job.. 6/7; wound  on the plantar right great toe over the distal phalanx. Wound is slightly smaller but looks healthier there is rims of epithelialization she still has some callus on the toe indicative of inadequate pressure/friction relief. I talked to her about making sure in the forefoot offloading boot that the weight stays on her heel in stance phase 01-31-2022 upon evaluation patient's wound is still continuing to show signs of really minimal progress unfortunately. I actually believe that she may need to go into the total contact cast in order to get this to heal and I discussed that with her today as well. Obviously this has been a little bit of a barrier for her not because she does not want to do it and does not want to get healed but rather because it is going to prevent her from being able to drive which is obviously going to be a significant quality-of-life issue with what she does in the position she is in at work. 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 I do believe she could work with the cast on but the  issue simply is that she cannot drive to get to work. Otherwise should be able to walk and do what she needs to do. 02-07-2022 upon evaluation today patient appears to be doing well with regard to her wounds. She has been tolerating the dressing changes without complication. Fortunately I do not see any signs of infection although the wound itself is not significantly smaller as of yet. This is something that we are trying to work on I do believe a total contact cast would be the right way to go. I discussed this with the patient today as well. She is actually in agreement with giving this a trial although she is a little nervous about it being that she is claustrophobic. 6/23; patient presents for obligatory cast exchange. She reports that her heel feels sore from the total contact cast. On that she had no issues. 02-14-2022 upon evaluation  today patient's wound is actually showing signs of excellent improvement. There does not appear to be any signs of active infection at this time and overall the toe looks amazing in just 1 week's time. We have come down almost half compared to where we were before size wise and this is also him as well. Overall I think that the casting has done extremely good for her and the toe in general looks significantly improved. I think this week since we want to have to mess with the wound bed itself just the callus around she will likely have even better improvement come next time. 02-21-2022 upon evaluation today patient appears to be doing well with regard to her toe. She is actually showing signs of improvement this is a little bit slower than she would like to see but nonetheless we are seeing significant changes in the right direction in my opinion. Fortunately she is also developing a lot less callus on the end of the toe which is helpful for her as well in my opinion. 02-28-2022 upon evaluation today patient appears to be doing well currently in regard to her wound. She has been tolerating the dressing changes. Fortunately I do not see any signs of infection I think the cast is doing an awesome job here. No fevers, chills, nausea, vomiting, or diarrhea. 03-07-2022 upon evaluation today patient appears to be doing well with regard to her wound although the collagen is very stuck and dry on the area on the tip of her toe at this point. Nonetheless I discussed with her that I am a little reluctant to put the cast on this week due to what appeared to been a blister along the left side of her toe laterally. Nonetheless this has been a little worried about the possibility of infection and I do not see anything obvious I think that we may not want to put the cast on this week just to be on the safe side she voiced understanding. With that being said if were not to use the cast she is going to need to make sure to  have still some pressure relief I think making gauze bolster with 3 gauze up underneath the toe is going to do a good job here. We will roll and tape this and then subsequently anchored underneath the big toe should be using the front offloading shoe as well. 7/26; patient I know from a previous stay in this clinic. Type II diabetic with a wound on her right plantar great toe. She was in a total contact cast to 2 weeks ago the wound was close  to closed last week according to our intake nurse. We put Xeroform and gauze on this that the patient has been changing her self. She has a forefoot offloading boot. 03-21-2022 upon a variable evaluation today patient appears to be doing somewhat poorly in regard to callus buildup at this time. Fortunately I do not see any evidence of overall worsening but this is just still open. No fevers, chills, nausea, vomiting, or diarrhea. I think we may have to go back into the cast next week if this is not significantly improved by that time. 03-28-2022 upon evaluation today patient's wound is actually showing signs of significant improvement which is great news. The cast over the past week he has done a great job. This is so small however but still has a little bit of a deeper area that I am concerned about the possibility of it drying out too much under the cast that is what we saw today and callused over. Subsequently I did remove the callus. 04-11-2022 upon evaluation today patient appears to still have a lot of callus buildup today. She was sick with COVID last weeks has been 2 weeks since have seen her. This is what accounts for some of the callus buildup in my opinion. With that being said she tells me she was not nearly as active working from home with COVID as she can go in. Nonetheless I do believe still that based on what we are seeing this is still too much friction is likely occurring around the toe area. Electronic Signature(s) Signed: 04/11/2022 4:12:10 PM By:  Lenda Kelp PA-C Previous Signature: 04/11/2022 2:36:30 PM Version By: Lenda Kelp PA-C Previous Signature: 04/11/2022 2:35:17 PM Version By: Lenda Kelp PA-C Entered By: Lenda Kelp on 04/11/2022 16:12:09 -------------------------------------------------------------------------------- Physical Exam Details Patient Name: Date of Service: EVERLENA, MACKLEY 04/11/2022 2:15 PM Medical Record Number: [id number] Patient Account Number: [id number] Date of Birth/Sex: Treating RN: Aug 15, 1966 (56 y.o. [name] Primary Care Provider: Hillard Danker Other Clinician: Referring Provider: Treating Provider/Extender: [name] in Treatment: 17 Constitutional Well-nourished and well-hydrated in no acute distress. Respiratory normal breathing without difficulty. Psychiatric this patient is able to make decisions and demonstrates good insight into disease process. Alert and Oriented x 3. pleasant and cooperative. Notes I did have to perform a fairly extensive debridement of clearway some of the callus at this point. Patient tolerated that today without complication and postdebridement the wound bed appears to be doing much better which is great news the area that is open and is probably about 2 mm round at this point. Electronic Signature(s) Signed: 04/11/2022 4:12:29 PM By: Lenda Kelp PA-C Previous Signature: 04/11/2022 2:36:56 PM Version By: Lenda Kelp PA-C Previous Signature: 04/11/2022 2:35:37 PM Version By: Lenda Kelp PA-C Entered By: Lenda Kelp on 04/11/2022 16:12:29 -------------------------------------------------------------------------------- Physician Orders Details Patient Name: Date of Service: Donna [name] 04/11/2022 2:15 PM Medical Record Number: [id number] Patient Account Number: [id number] Date of Birth/Sex: Treating RN: 09-24-1965 (56 y.o. [name] Primary Care Provider: Hillard Danker Other  Clinician: Referring Provider: Treating Provider/Extender: [name] in Treatment: [phone] Verbal / Phone Orders: No Diagnosis Coding ICD-10 Coding Code Description E11.621 Type 2 diabetes mellitus with foot ulcer L97.518 Non-pressure chronic ulcer of other part of right foot with other specified severity E11.42 Type 2 diabetes mellitus with diabetic polyneuropathy I10 Essential (primary) hypertension Follow-up Appointments ppointment in 1 week. Allen Derry, PA  and Bobbi, Room 6 overflow Wednesday 04/18/2022 0830 Return A Anesthetic (In clinic) Topical Lidocaine 4% applied to wound bed Cellular or Tissue Based Products Bathing/ Shower/ Hygiene May shower with protection but do not get wound dressing(s) wet. - May purchase cast protector from [facility], [facility], or [employer] Edema Control - Lymphedema / SCD / Other Elevate legs to the level of the heart or above for 30 minutes daily and/or when sitting, a frequency of: - 3-4 times a day throughout the day. Avoid standing for long periods of time. Off-Loading Wedge shoe to: - right foot while walking and standing. limit walking and standing. Wound Treatment Wound #2 - T Great oe Wound Laterality: Right Cleanser: Normal Saline 1 x Per Day/30 Days Discharge Instructions: Cleanse the wound with Normal Saline prior to applying a clean dressing using gauze sponges, not tissue or cotton balls. Cleanser: Soap and Water 1 x Per Day/30 Days Discharge Instructions: May shower and wash wound with dial antibacterial soap and water prior to dressing change. Prim Dressing: PolyMem Non-Adhesive Dressing, 4x4 in 1 x Per Day/30 Days ary Discharge Instructions: Apply to wound bed as instructed Secured With: 49M Medipore H Soft Cloth Surgical T ape, 4 x 10 (in/yd) (Generic) 1 x Per Day/30 Days Discharge Instructions: Secure with tape as directed. Electronic Signature(s) Signed: 04/11/2022 4:23:52 PM By: Lenda Kelp  PA-C Signed: 04/11/2022 5:54:03 PM By: Shawn Stall RN, BSN Entered By: Shawn Stall on 04/11/2022 15:05:40 -------------------------------------------------------------------------------- Problem List Details Patient Name: Date of Service: Donna [name] 04/11/2022 2:15 PM Medical Record Number: [id number] Patient Account Number: [id number] Date of Birth/Sex: Treating RN: [birth year] (56 y.o. [name], [name] Primary Care Provider: Hillard Danker Other Clinician: Referring Provider: Treating Provider/Extender: [name] in Treatment: 17 Active Problems ICD-10 Encounter Code Description Active Date MDM Diagnosis E11.621 Type 2 diabetes mellitus with foot ulcer 12/13/2021 No Yes L97.518 Non-pressure chronic ulcer of other part of right foot with other specified 03/14/2022 No Yes severity E11.42 Type 2 diabetes mellitus with diabetic polyneuropathy 12/13/2021 No Yes I10 Essential (primary) hypertension 12/13/2021 No Yes Inactive Problems ICD-10 Code Description Active Date Inactive Date L97.812 Non-pressure chronic ulcer of other part of right lower leg with fat layer exposed 12/13/2021 12/13/2021 Resolved Problems Electronic Signature(s) Signed: 04/11/2022 2:26:39 PM By: Lenda Kelp PA-C Entered By: Lenda Kelp on 04/11/2022 14:26:39 -------------------------------------------------------------------------------- Progress Note Details Patient Name: Date of Service: Donna [name] 04/11/2022 2:15 PM Medical Record Number: [id number] Patient Account Number: [id number] Date of Birth/Sex: Treating RN: 1966-04-23 (56 y.o. 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 She tells me that since at least last summer she has had a progressive callused area that has opened closed but is never really healed. She has a history of calluses on her feet. She is a diabetic with neuropathy. She was referred to Dr. Allena Katz at [facility] who felt that this might be a large plantar wart she was treated with laser for 2 treatments and then subsequently with ammonium lactate none of this is apparently made any difference according to the patient. She has a very odd looking area with thick skin and callus and an almost crossed like area  of open tissue. She also has erythema above this which she says is chronic. The other interesting part of her recent history is recurrent cellulitis on the right lower leg although this is separated from the area we are looking at. She has had trips to see infectious disease has been on Keflex for as long as 20 days. This is presumed to be strep. She saw Dr. Renold Don Past medical history includes type 2 diabetes, recurrent cellulitis of the right leg, right Achilles tendon repair a year ago for spontaneous rupture ABI in our clinic was 1.09 on the right 2/17; patient admitted the clinic last week had a thick callused area on the right lateral heel she is a diabetic with neuropathy. She had thick callus and across like area of open wound. I aggressively debrided this area to remove most of the callus and gave her Hydrofera Blue and heel off loader, heel cup and she has been wearing this religiously trying to keep the pressure off the area. She comes in today with a cross-like area of injury has healed she has a new open area that I do not remember from last time. Still some dry flaking skin and callus but not nearly as much as last week at which time I had to use a scalpel to remove this. 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 She came in 2 weeks ago with a very thick hyperkeratotic callused area on her right lateral heel. There was a "crossed" shaped area of split tissue with an open wound at the bottom. This had previously been treated as a possible wart without improvement. I removed all of the callus when she first came in here. This is mostly epithelialized over still thick skin but nothing like it was. She has 1 small open area that has not healed she has been more religious about offloading the area 10/27/2020 upon evaluation today patient appears to be doing well with regard to her wound although she does still apparently appear to have something still open in the base of the wound. Fortunately there is no evidence of infection at this time which is great news and in general I feel like she is doing decently well though she has a lot of callus buildup she is using the offloading shoe for her heel. At home she does not always wear this she tells me. Generally she is using flip-flops there. Of note her primary care provider did give her Keflex yesterday for cellulitis of this leg but has nothing to do with the heel location. 3/17; the patient's area actually looks a lot better. There is no open wound here. Much less callus. She was given a prescription for 40% urea cream I am not sure that really is required this week. She is going to require less friction on this area. I think she is going to need a different type of shoe going forward Readmission: 12-13-2028 upon evaluation today patient presents for reevaluation in the clinic she was last seen by Dr. Leanord Hawking on 11-03-2020 when she was discharged this was due to a crack on her heel. Currently she is actually having an issue with her great toe on the right foot. She tells me that this has been present currently for a couple of weeks since around November 22, 2021. She has had a lot of callus over this area in general but not an open wound until that time.  Subsequently this has more recently opened and has  Become a greater concern she is also noted some odor which is disconcerting to her. She tells me that her greatest concern is that "she does not lose this toe". Again I completely understand her concern in this regard I definitely think we need to be aggressive in treating this and try to get things better. She is very Adult nurse. Patient does have a history of diabetes mellitus type 2 with diabetic neuropathy. She also tells me that she does have a history as well of hypertension which is pretty well controlled. She has not yet had an x-ray of the area and has not been on any antibiotics. Both are things I think we will need to do today. 12-20-2021 upon evaluation today patient appears to be doing well with regard to her toe ulcer all things considered. She has been tolerating the dressing changes without complication. I do have her x-ray as well as her culture for review today the culture did so Staphylococcus aureus no MRSA noted which was great news susceptible to doxycycline which is what I did place her on last week. Overall the odor that she was noticing has also cleared which is great news. With regard to the x-ray there does not appear to be any signs of osteomyelitis or bone erosion at this point which is also great news in general and very pleased with where things stand. 12-27-2021 upon evaluation today patient appears to be doing well with regard to her wound this is actually measuring significantly better which is great news. Overall I am extremely pleased with where we stand today. No fevers, chills, nausea, vomiting, or diarrhea. 01-03-2022 upon evaluation today patient's wound is actually showing signs of significant improvement which is also news. I am very pleased with what we are seeing there is a little bit of callus buildup still but we are going to work on that today otherwise she seems to be making excellent progress which is  great news. 01-10-2022 upon evaluation today patient appears to be doing well currently in regard to her toe ulcer. She has been tolerating the dressing changes without complication. Fortunately there does not appear to be any evidence of active infection locally or systemically which is great news. No fevers, chills, nausea, vomiting, or diarrhea. 01-17-2022 upon evaluation today patient's toe was actually doing about the same this week there is not a tremendous improvement in size but overall the appearance of the wound is better it is much flatter I do think the [facility] has done well in that regard. We will still wait to hear back on the skin substitute. With that being said I do believe that the patient could benefit from again the skin substitute to try to help get this to heal much more effectively and quickly in the meantime I am trying to help flatten this out which I think is doing a pretty good job.. 6/7; wound on the plantar right great toe over the distal phalanx. Wound is slightly smaller but looks healthier there is rims of epithelialization she still has some callus on the toe indicative of inadequate pressure/friction relief. I talked to her about making sure in the forefoot offloading boot that the weight stays on her heel in stance phase 01-31-2022 upon evaluation patient's wound is still continuing to show signs of really minimal progress unfortunately. I actually believe that she may need to go into the total contact cast in order to get this to heal and I discussed that with her today as well.  Obviously this has been a little bit of a barrier for her not because she does not want to do it and does not want to get healed but rather because it is going to prevent her from being able to drive which is obviously going to be a significant quality-of-life issue with what she does in the position she is in at work. 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 I do believe she could work with the cast on but the issue simply is that she cannot drive to get to work. Otherwise should be able to walk and do what she needs to do. 02-07-2022 upon evaluation today patient appears to be doing well with regard to her wounds. She has been tolerating the dressing changes without complication. Fortunately I do not see any signs of infection although the wound itself is not significantly smaller as of yet. This is something that we are trying to work on I do believe a total contact cast would be the right way to go. I discussed this with the patient today as well. She is actually in agreement with giving this a trial although she is a little nervous about it being that she is claustrophobic. 6/23; patient presents for obligatory cast exchange. She reports that her heel feels sore from the total contact cast. On that she had no issues. 02-14-2022 upon evaluation today patient's wound is actually showing signs of excellent improvement. There does not appear to be any signs of active infection at this time and overall the toe looks amazing in just 1 week's time. We have come down almost half compared to where we were before size wise and this is also him as well. Overall I think that the casting has done extremely good for her and the toe in general looks significantly improved. I think this week since we want to have to mess with the wound bed itself just the callus around she will likely have even better improvement come next time. 02-21-2022 upon evaluation today patient appears to be doing well with regard to her toe. She is actually showing signs of improvement this is a little bit slower than she would like to see but nonetheless we are seeing significant changes in the right direction in my opinion. Fortunately she is also developing a lot less callus on the end of the toe which is helpful for her as well in my opinion. 02-28-2022  upon evaluation today patient appears to be doing well currently in regard to her wound. She has been tolerating the dressing changes. Fortunately I do not see any signs of infection I think the cast is doing an awesome job here. No fevers, chills, nausea, vomiting, or diarrhea. 03-07-2022 upon evaluation today patient appears to be doing well with regard to her wound although the collagen is very stuck and dry on the area on the tip of her toe at this point. Nonetheless I discussed with her that I am a little reluctant to put the cast on this week due to what appeared to been a blister along the left side of her toe laterally. Nonetheless this has been a little worried about the possibility of infection and I do not see anything obvious I think that we may not want to put the cast on this week just to be on the safe side she voiced understanding. With that being said if were not to use the  cast she is going to need to make sure to have still some pressure relief I think making gauze bolster with 3 gauze up underneath the toe is going to do a good job here. We will roll and tape this and then subsequently anchored underneath the big toe should be using the front offloading shoe as well. 7/26; patient I know from a previous stay in this clinic. Type II diabetic with a wound on her right plantar great toe. She was in a total contact cast to 2 weeks ago the wound was close to closed last week according to our intake nurse. We put Xeroform and gauze on this that the patient has been changing her self. She has a forefoot offloading boot. 03-21-2022 upon a variable evaluation today patient appears to be doing somewhat poorly in regard to callus buildup at this time. Fortunately I do not see any evidence of overall worsening but this is just still open. No fevers, chills, nausea, vomiting, or diarrhea. I think we may have to go back into the cast next week if this is not significantly improved by that  time. 03-28-2022 upon evaluation today patient's wound is actually showing signs of significant improvement which is great news. The cast over the past week he has done a great job. This is so small however but still has a little bit of a deeper area that I am concerned about the possibility of it drying out too much under the cast that is what we saw today and callused over. Subsequently I did remove the callus. 04-11-2022 upon evaluation today patient appears to still have a lot of callus buildup today. She was sick with COVID last weeks has been 2 weeks since have seen her. This is what accounts for some of the callus buildup in my opinion. With that being said she tells me she was not nearly as active working from home with COVID as she can go in. Nonetheless I do believe still that based on what we are seeing this is still too much friction is likely occurring around the toe area. Objective Constitutional Well-nourished and well-hydrated in no acute distress. Vitals Time Taken: 2:26 PM, Height: 66 in, Weight: 220 lbs, BMI: 35.5, Temperature: 98.6 F, Pulse: 74 bpm, Respiratory Rate: 20 breaths/min, Blood Pressure: 137/81 mmHg. Respiratory normal breathing without difficulty. Psychiatric this patient is able to make decisions and demonstrates good insight into disease process. Alert and Oriented x 3. pleasant and cooperative. General Notes: I did have to perform a fairly extensive debridement of clearway some of the callus at this point. Patient tolerated that today without complication and postdebridement the wound bed appears to be doing much better which is great news the area that is open and is probably about 2 mm round at this point. Integumentary (Hair, Skin) Wound #2 status is Open. Original cause of wound was Blister. The date acquired was: 11/22/2021. The wound has been in treatment 17 weeks. The wound is located on the Right T Great. The wound measures 0.1cm length x 0.1cm width x  0.1cm depth; 0.008cm^2 area and 0.001cm^3 volume. There is no tunneling oe or undermining noted. There is a none present amount of drainage noted. The wound margin is distinct with the outline attached to the wound base. There is no granulation within the wound bed. There is no necrotic tissue within the wound bed. General Notes: callous periwound. Assessment Active Problems ICD-10 Type 2 diabetes mellitus with foot ulcer Non-pressure chronic ulcer of other part  of right foot with other specified severity Type 2 diabetes mellitus with diabetic polyneuropathy Essential (primary) hypertension Procedures Wound #2 Pre-procedure diagnosis of Wound #2 is a Diabetic Wound/Ulcer of the Lower Extremity located on the Right T Great .Severity of Tissue Pre Debridement is: oe Fat layer exposed. There was a Selective/Open Wound Non-Viable Tissue Debridement with a total area of 9 sq cm performed by Lenda Kelp, PA. With the following instrument(s): Curette to remove Non-Viable tissue/material. Material removed includes Callus after achieving pain control using Lidocaine 4% Topical Solution. A time out was conducted at 14:45, prior to the start of the procedure. A Minimum amount of bleeding was controlled with Pressure. The procedure was tolerated well with a pain level of 0 throughout and a pain level of 0 following the procedure. Post Debridement Measurements: 0.1cm length x 0.1cm width x 0.1cm depth; 0.001cm^3 volume. Character of Wound/Ulcer Post Debridement is improved. Severity of Tissue Post Debridement is: Fat layer exposed. Post procedure Diagnosis Wound #2: Same as Pre-Procedure Plan Follow-up Appointments: Return Appointment in 1 week. Allen Derry, PA and [place], Room 6 overflow Wednesday 04/18/2022 0830 Anesthetic: (In clinic) Topical Lidocaine 4% applied to wound bed Cellular or Tissue Based Products: Bathing/ Shower/ Hygiene: May shower with protection but do not get wound  dressing(s) wet. - May purchase cast protector from [facility], [facility], or [employer] Edema Control - Lymphedema / SCD / Other: Elevate legs to the level of the heart or above for 30 minutes daily and/or when sitting, a frequency of: - 3-4 times a day throughout the day. Avoid standing for long periods of time. Off-Loading: Wedge shoe to: - right foot while walking and standing. limit walking and standing. WOUND #2: - T Great Wound Laterality: Right oe Cleanser: Normal Saline 1 x Per Day/30 Days Discharge Instructions: Cleanse the wound with Normal Saline prior to applying a clean dressing using gauze sponges, not tissue or cotton balls. Cleanser: Soap and Water 1 x Per Day/30 Days Discharge Instructions: May shower and wash wound with dial antibacterial soap and water prior to dressing change. Prim Dressing: PolyMem Non-Adhesive Dressing, 4x4 in 1 x Per Day/30 Days ary Discharge Instructions: Apply to wound bed as instructed Secured With: 38M Medipore H Soft Cloth Surgical T ape, 4 x 10 (in/yd) (Generic) 1 x Per Day/30 Days Discharge Instructions: Secure with tape as directed. 1. Based on exam currently regular make a little change and see if how this will do for her. The patient is in agreement with the plan. This includes the use of using PolyMem to try to help with padding the toe and will get a use a little less bulky dressing to see how this goes. 2. I am also can recommend that we have the patient continue with her offloading shoe. 3. I am also going to suggest that we have the patient continue to try to stay off of the foot is much as possible obviously the more that she can do the better. We will see patient back for reevaluation in 1 week here in the clinic. If anything worsens or changes patient will contact our office for additional recommendations. If she is not doing better by next week I really think we probably should proceed with reapply the cast try to get this closed. Electronic  Signature(s) Signed: 04/11/2022 4:13:23 PM By: Lenda Kelp PA-C Previous Signature: 04/11/2022 2:37:50 PM Version By: Lenda Kelp PA-C Previous Signature: 04/11/2022 2:36:04 PM Version By: Lenda Kelp PA-C Entered By:  Lenda Kelp on 04/11/2022 16:13:22 -------------------------------------------------------------------------------- SuperBill Details Patient Name: Date of Service: ADALYN, PENNOCK 04/11/2022 Medical Record Number: [id number] Patient Account Number: [id number] Date of Birth/Sex: Treating RN: 1966/01/12 (56 y.o. [name] Primary Care Provider: Hillard Danker Other Clinician: Referring Provider: Treating Provider/Extender: [name] in Treatment: 17 Diagnosis Coding ICD-10 Codes Code Description E11.621 Type 2 diabetes mellitus with foot ulcer L97.518 Non-pressure chronic ulcer of other part of right foot with other specified severity E11.42 Type 2 diabetes mellitus with diabetic polyneuropathy I10 Essential (primary) hypertension Facility Procedures CPT4 Code: [id number] Description: [phone] - DEBRIDE WOUND 1ST 20 SQ CM OR < ICD-10 Diagnosis Description L97.518 Non-pressure chronic ulcer of other part of right foot with other specified sever Modifier: ity Quantity: 1 Physician Procedures : CPT4 Code Description Modifier [id number] - WC PHYS DEBR WO ANESTH 20 SQ CM ICD-10 Diagnosis Description L97.518 Non-pressure chronic ulcer of other part of right foot with other specified severity Quantity: 1 Electronic Signature(s) Signed: 04/11/2022 4:13:36 PM By: Lenda Kelp PA-C Entered By: Lenda Kelp on 04/11/2022 16:13:36

## 2022-04-11 NOTE — Progress Notes (Signed)
Donna [name], Donna [name] ([id number]) Visit Report for 04/11/2022 Arrival Information Details Patient Name: Date of Service: Donna [name], Donna [name] 04/11/2022 2:15 PM Medical Record Number: [id number] Patient Account Number: [id number] Date of Birth/Sex: Treating RN: 21-Sep-1965 (56 y.o. Donna [name], [name] Primary Care Kayren Holck: Pricilla Holm Other Clinician: Referring Jalisha Enneking: Treating Idelle Reimann/Extender: Dreama Saa in Treatment: 110 Visit Information History Since Last Visit Added or deleted any medications: No Patient Arrived: Ambulatory Any new allergies or adverse reactions: No Arrival Time: 14:22 Had a fall or experienced change in No Accompanied By: self activities of daily living that may affect Transfer Assistance: None risk of falls: Patient Identification Verified: Yes Signs or symptoms of abuse/neglect since last visito No Secondary Verification Process Completed: Yes Hospitalized since last visit: No Patient Requires Transmission-Based Precautions: No Implantable device outside of the clinic excluding No Patient Has Alerts: No cellular tissue based products placed in the center since last visit: Has Dressing in Place as Prescribed: Yes Has Footwear/Offloading in Place as Prescribed: Yes Right: Wedge [name] Pain Present Now: No Electronic Signature(s) Signed: 04/11/2022 5:54:03 PM By: Deon Pilling RN, BSN Entered By: Deon Pilling on 04/11/2022 14:23:17 -------------------------------------------------------------------------------- Encounter Discharge Information Details Patient Name: Date of Service: Donna [name] 04/11/2022 2:15 PM Medical Record Number: [id number] Patient Account Number: [id number] Date of Birth/Sex: Treating RN: [birth year] (56 y.o. Donna [name] Primary Care Dantavious Snowball: Pricilla Holm Other Clinician: Referring Shaylynne Lunt: Treating Wyley Hack/Extender: Dreama Saa in Treatment:  17 Encounter Discharge Information Items Post Procedure Vitals Discharge Condition: Stable Temperature (F): 98.6 Ambulatory Status: Ambulatory Pulse (bpm): 74 Discharge Destination: Home Respiratory Rate (breaths/min): 20 Transportation: Private Auto Blood Pressure (mmHg): 137/81 Accompanied By: self Schedule Follow-up Appointment: Yes Clinical Summary of Care: Electronic Signature(s) Signed: 04/11/2022 5:54:03 PM By: Deon Pilling RN, BSN Entered By: Deon Pilling on 04/11/2022 15:54:16 -------------------------------------------------------------------------------- Lower Extremity Assessment Details Patient Name: Date of Service: Donna [name], Donna [name] 04/11/2022 2:15 PM Medical Record Number: [id number] Patient Account Number: [id number] Date of Birth/Sex: Treating RN: 1965/12/18 (56 y.o. Donna [name] Primary Care Tiffannie Sloss: Pricilla Holm Other Clinician: Referring Alcee Sipos: Treating Donna [name]/Extender: Dreama Saa in Treatment: 17 Edema Assessment Assessed: [Left: No] Donna [name]: Yes] Edema: [Left: N] [Right: o] Calf Left: Right: Point of Measurement: 28 cm From Medial Instep 36 cm Ankle Left: Right: Point of Measurement: 8 cm From Medial Instep 22 cm Vascular Assessment Pulses: Dorsalis Pedis Palpable: [Right:Yes] Electronic Signature(s) Signed: 04/11/2022 5:54:03 PM By: Deon Pilling RN, BSN Entered By: Deon Pilling on 04/11/2022 14:25:40 -------------------------------------------------------------------------------- Loyal Details Patient Name: Date of Service: Donna [name] 04/11/2022 2:15 PM Medical Record Number: [id number] Patient Account Number: [id number] Date of Birth/Sex: Treating RN: Mar 02, 1966 (56 y.o. Donna [name], [name] Primary Care Fiza Nation: Pricilla Holm Other Clinician: Referring Travell Desaulniers: Treating Shemar Plemmons/Extender: Dreama Saa in Treatment:  17 Active Inactive Pain, Acute or Chronic Nursing Diagnoses: Pain, acute or chronic: actual or potential Potential alteration in comfort, pain Goals: Patient will verbalize adequate pain control and receive pain control interventions during procedures as needed Date Initiated: 12/13/2021 Target Resolution Date: 04/19/2022 Goal Status: Active Patient/caregiver will verbalize comfort level met Date Initiated: 12/13/2021 Date Inactivated: 03/14/2022 Target Resolution Date: 03/16/2022 Goal Status: Met Interventions: Encourage patient to take pain medications as prescribed Provide education on pain management Reposition patient for comfort Treatment Activities: Administer pain control measures as ordered : 12/13/2021 Notes: Wound/Skin Impairment Nursing Diagnoses: Knowledge deficit related to ulceration/compromised skin integrity Goals: Patient/caregiver will  verbalize understanding of skin care regimen Date Initiated: 12/13/2021 Target Resolution Date: 04/19/2022 Goal Status: Active Interventions: Assess patient/caregiver ability to obtain necessary supplies Assess patient/caregiver ability to perform ulcer/skin care regimen upon admission and as needed Provide education on ulcer and skin care Treatment Activities: Skin care regimen initiated : 12/13/2021 Topical wound management initiated : 12/13/2021 Notes: Electronic Signature(s) Signed: 04/11/2022 5:54:03 PM By: Deon Pilling RN, BSN Entered By: Deon Pilling on 04/11/2022 14:30:15 -------------------------------------------------------------------------------- Pain Assessment Details Patient Name: Date of Service: Donna [name] 04/11/2022 2:15 PM Medical Record Number: [id number] Patient Account Number: [id number] Date of Birth/Sex: Treating RN: Feb 20, 1966 (56 y.o. Donna [name] Primary Care Saahir Prude: Pricilla Holm Other Clinician: Referring Eira Alpert: Treating Madysin Crisp/Extender: Dreama Saa in Treatment: 17 Active Problems Location of Pain Severity and Description of Pain Patient Has Paino No Site Locations Rate the pain. Current Pain Level: 0 Pain Management and Medication Current Pain Management: Medication: No Cold Application: No Rest: No Massage: No Activity: No T.E.N.S.: No Heat Application: No Leg drop or elevation: No Is the Current Pain Management Adequate: Adequate How does your wound impact your activities of daily livingo Sleep: No Bathing: No Appetite: No Relationship With Others: No Bladder Continence: No Emotions: No Bowel Continence: No Work: No Toileting: No Drive: No Dressing: No Hobbies: No Engineer, maintenance) Signed: 04/11/2022 5:54:03 PM By: Deon Pilling RN, BSN Entered By: Deon Pilling on 04/11/2022 14:26:22 -------------------------------------------------------------------------------- Patient/Caregiver Education Details Patient Name: Date of Service: Donna [name] 8/23/2023andnbsp2:15 PM Medical Record Number: [id number] Patient Account Number: [id number] Date of Birth/Gender: Treating RN: November 21, 1965 (56 y.o. Donna [name] Primary Care Physician: Pricilla Holm Other Clinician: Referring Physician: Treating Physician/Extender: Dreama Saa in Treatment: 83 Education Assessment Education Provided To: Patient Education Topics Provided Wound/Skin Impairment: Handouts: Skin Care Do's and Dont's Methods: Explain/Verbal Responses: Reinforcements needed Electronic Signature(s) Signed: 04/11/2022 5:54:03 PM By: Deon Pilling RN, BSN Entered By: Deon Pilling on 04/11/2022 14:30:26 -------------------------------------------------------------------------------- Wound Assessment Details Patient Name: Date of Service: Donna [name] 04/11/2022 2:15 PM Medical Record Number: [id number] Patient Account Number: [id number] Date of Birth/Sex: Treating RN: [birth year]  (56 y.o. Donna [name], Meta.Reding Primary Care Kashawna Manzer: Pricilla Holm Other Clinician: Referring Nykira Reddix: Treating Gari Hartsell/Extender: Dreama Saa in Treatment: 17 Wound Status Wound Number: 2 Primary Etiology: Diabetic Wound/Ulcer of the Lower Extremity Wound Location: Right T Great oe Wound Status: Open Wounding Event: Blister Comorbid History: Hypertension, Type II Diabetes, Neuropathy Date Acquired: 11/22/2021 Weeks Of Treatment: 17 Clustered Wound: No Photos Wound Measurements Length: (cm) 0.1 Width: (cm) 0.1 Depth: (cm) 0.1 Area: (cm) 0.008 Volume: (cm) 0.001 % Reduction in Area: 99.8% % Reduction in Volume: 99.7% Epithelialization: Large (67-100%) Tunneling: No Undermining: No Wound Description Classification: Grade 2 Wound Margin: Distinct, outline attached Exudate Amount: None Present Foul Odor After Cleansing: No Slough/Fibrino No Wound Bed Granulation Amount: None Present (0%) Exposed Structure Necrotic Amount: None Present (0%) Fascia Exposed: No Fat Layer (Subcutaneous Tissue) Exposed: No Tendon Exposed: No Muscle Exposed: No Joint Exposed: No Bone Exposed: No Assessment Notes callous periwound. Treatment Notes Wound #2 (Toe Great) Wound Laterality: Right Cleanser Normal Saline Discharge Instruction: Cleanse the wound with Normal Saline prior to applying a clean dressing using gauze sponges, not tissue or cotton balls. Soap and Water Discharge Instruction: May shower and wash wound with dial antibacterial soap and water prior to dressing change. Peri-Wound Care Topical Primary Dressing PolyMem Non-Adhesive Dressing, 4x4 in Discharge Instruction: Apply to wound  bed as instructed Secondary Dressing Secured With 36M [place] Surgical T ape, 4 x 10 (in/yd) Discharge Instruction: Secure with tape as directed. Compression Wrap Compression Stockings Add-Ons Electronic Signature(s) Signed: 04/11/2022  5:54:03 PM By: Deon Pilling RN, BSN Entered By: Deon Pilling on 04/11/2022 15:01:13 -------------------------------------------------------------------------------- Vitals Details Patient Name: Date of Service: Donna [name] 04/11/2022 2:15 PM Medical Record Number: [id number] Patient Account Number: [id number] Date of Birth/Sex: Treating RN: 10/02/1965 (56 y.o. Donna [name], [name] Primary Care Jaylan Hinojosa: Pricilla Holm Other Clinician: Referring Krystle Oberman: Treating Anyeli Hockenbury/Extender: Dreama Saa in Treatment: 17 Vital Signs Time Taken: 14:26 Temperature (F): 98.6 Height (in): 66 Pulse (bpm): 74 Weight (lbs): 220 Respiratory Rate (breaths/min): 20 Body Mass Index (BMI): 35.5 Blood Pressure (mmHg): 137/81 Reference Range: 80 - 120 mg / dl Electronic Signature(s) Signed: 04/11/2022 5:54:03 PM By: Deon Pilling RN, BSN Entered By: Deon Pilling on 04/11/2022 14:26:14

## 2022-04-17 ENCOUNTER — Other Ambulatory Visit: Payer: Self-pay | Admitting: Internal Medicine

## 2022-04-18 ENCOUNTER — Encounter (HOSPITAL_BASED_OUTPATIENT_CLINIC_OR_DEPARTMENT_OTHER): Payer: 59 | Admitting: Physician Assistant

## 2022-04-18 DIAGNOSIS — E1142 Type 2 diabetes mellitus with diabetic polyneuropathy: Secondary | ICD-10-CM | POA: Diagnosis not present

## 2022-04-18 DIAGNOSIS — L97518 Non-pressure chronic ulcer of other part of right foot with other specified severity: Secondary | ICD-10-CM | POA: Diagnosis not present

## 2022-04-18 DIAGNOSIS — E11621 Type 2 diabetes mellitus with foot ulcer: Secondary | ICD-10-CM | POA: Diagnosis not present

## 2022-04-18 DIAGNOSIS — I10 Essential (primary) hypertension: Secondary | ICD-10-CM | POA: Diagnosis not present

## 2022-04-18 NOTE — Progress Notes (Signed)
HALCYON, Donna [name] ([id number]) Visit Report for 04/18/2022 Arrival Information Details Patient Name: Date of Service: Donna [name], Donna [name] 04/18/2022 8:30 A M Medical Record Number: [id number] Patient Account Number: [id number] Date of Birth/Sex: Treating RN: 24-Jan-1966 (55 y.o. F) Primary Care Donna [name]: Donna [name] Other Clinician: Referring Donna [name]: Treating Donna [name]/Extender: Donna [name]: 18 Visit Information History Since Last Visit Added or deleted any medications: No Patient Arrived: Ambulatory Any new allergies or adverse reactions: No Arrival Time: 08:29 Had a fall or experienced change in No Accompanied By: self activities of daily living that may affect Transfer Assistance: None risk of falls: Patient Identification Verified: Yes Signs or symptoms of abuse/neglect since No Secondary Verification Process Completed: Yes last visito Patient Requires Transmission-Based Precautions: No Hospitalized since last visit: No Patient Has Alerts: No Implantable device outside of the clinic No excluding cellular tissue based products placed in the center since last visit: Has Dressing in Place as Prescribed: Yes Has Footwear/Offloading in Place as Yes Prescribed: Right: Surgical Shoe with Pressure Relief Insole Pain Present Now: No Electronic Signature(s) Signed: 04/18/2022 4:51:17 PM By: Thayer Dallas Entered By: Thayer Dallas on 04/18/2022 08:32:37 -------------------------------------------------------------------------------- Clinic Level of Care Assessment Details Patient Name: Date of Service: Donna [name], Donna [name] 04/18/2022 8:30 A M Medical Record Number: [id number] Patient Account Number: [id number] Date of Birth/Sex: Treating RN: [birth year] (56 y.o. Donna [name], Donna [name] Primary Care Donna [name]: Donna [name] Other Clinician: Referring Donna [name]: Treating Donna [name]/Extender: Donna [name] in  [name]: 18 Clinic Level of Care Assessment Items TOOL 4 Quantity Score X- 1 0 Use when only an EandM is performed on FOLLOW-UP visit ASSESSMENTS - Nursing Assessment / Reassessment X- 1 10 Reassessment of Co-morbidities (includes updates in patient status) X- 1 5 Reassessment of Adherence to [name] Plan ASSESSMENTS - Wound and Skin A ssessment / Reassessment X - Simple Wound Assessment / Reassessment - one wound 1 5 []  - 0 Complex Wound Assessment / Reassessment - multiple wounds X- 1 10 Dermatologic / Skin Assessment (not related to wound area) ASSESSMENTS - Focused Assessment X- 1 5 Circumferential Edema Measurements - multi extremities []  - 0 Nutritional Assessment / Counseling / Intervention []  - 0 Lower Extremity Assessment (monofilament, tuning fork, pulses) []  - 0 Peripheral Arterial Disease Assessment (using hand held doppler) ASSESSMENTS - Ostomy and/or Continence Assessment and Care []  - 0 Incontinence Assessment and Management []  - 0 Ostomy Care Assessment and Management (repouching, etc.) PROCESS - Coordination of Care X - Simple Patient / Family Education for ongoing care 1 15 []  - 0 Complex (extensive) Patient / Family Education for ongoing care X- 1 10 Staff obtains , Records, T Results / Process Orders est []  - 0 Staff telephones HHA, Nursing Homes / Clarify orders / etc []  - 0 Routine Transfer to another Facility (non-emergent condition) []  - 0 Routine Hospital Admission (non-emergent condition) []  - 0 New Admissions / / Ordering NPWT Apligraf, etc. , []  - 0 Emergency Hospital Admission (emergent condition) X- 1 10 Simple Discharge Coordination []  - 0 Complex (extensive) Discharge Coordination PROCESS - Special Needs []  - 0 Pediatric / Minor Patient Management []  - 0 Isolation Patient Management []  - 0 Hearing / Language / Visual special needs []  - 0 Assessment of Community assistance (transportation,  D/C planning, etc.) []  - 0 Additional assistance / Altered mentation []  - 0 Support Surface(s) Assessment (bed, cushion, seat, etc.) INTERVENTIONS - Wound Cleansing / Measurement X - Simple Wound Cleansing - one  wound 1 5 []  - 0 Complex Wound Cleansing - multiple wounds X- 1 5 Wound Imaging (photographs - any number of wounds) []  - 0 Wound Tracing (instead of photographs) X- 1 5 Simple Wound Measurement - one wound []  - 0 Complex Wound Measurement - multiple wounds INTERVENTIONS - Wound Dressings []  - 0 Small Wound Dressing one or multiple wounds []  - 0 Medium Wound Dressing one or multiple wounds []  - 0 Large Wound Dressing one or multiple wounds []  - 0 Application of Medications - topical []  - 0 Application of Medications - injection INTERVENTIONS - Miscellaneous []  - 0 External ear exam []  - 0 Specimen Collection (cultures, biopsies, blood, body fluids, etc.) []  - 0 Specimen(s) / Culture(s) sent or taken to Lab for analysis []  - 0 Patient Transfer (multiple staff / / Similar devices) []  - 0 Simple Staple / Suture removal (25 or less) []  - 0 Complex Staple / Suture removal (26 or more) []  - 0 Hypo / Hyperglycemic Management (close monitor of Blood Glucose) []  - 0 Ankle / Brachial Index (ABI) - do not check if billed separately X- 1 5 Vital Signs Has the patient been seen at the hospital within the last three years: Yes Total Score: 90 Level Of Care: New/Established - Level 3 Electronic Signature(s) Signed: 04/18/2022 4:57:16 PM By: RN, BSN Entered By: on 04/18/2022 08:49:50 -------------------------------------------------------------------------------- Encounter Discharge Information Details Patient Name: Date of Service: 04/18/2022 8:30 A M Medical Record Number: Patient Account Number: Date of Birth/Sex: Treating RN: [birth year] (56 y.o. Primary Care Donna [name]:  Nurse, adult Other Clinician: Referring Donna [name]: Treating Donna [name]/Extender: in [name]: 18 Encounter Discharge Information Items Discharge Condition: Stable Ambulatory Status: Ambulatory Discharge Destination: Home Transportation: Private Auto Accompanied By: self Schedule Follow-up Appointment: No Clinical Summary of Care: Electronic Signature(s) Signed: 04/18/2022 4:57:16 PM By: RN, BSN Entered By: 04/20/2022 on 04/18/2022 08:50:06 -------------------------------------------------------------------------------- Lower Extremity Assessment Details Patient Name: Date of Service: Donna [name], Donna [name] 04/18/2022 8:30 A M Medical Record Number: Donna [name] Patient Account Number: 04/20/2022 Date of Birth/Sex: Treating RN: 1966/03/17 (56 y.o. F) Primary Care Nilda Keathley: 13/12/1965 Other Clinician: Referring Ayomide Zuleta: Treating Laasia Arcos/Extender: 58 in [name]: 18 Edema Assessment Assessed: [Left: No] [Right: No] Edema: [Left: N] [Right: o] Calf Left: Right: Point of Measurement: 28 cm From Medial Instep 38 cm Ankle Left: Right: Point of Measurement: 8 cm From Medial Instep 22.5 cm Electronic Signature(s) Signed: 04/18/2022 4:51:17 PM By: Donna [name] Entered By: Donna [name] on 04/18/2022 08:37:05 -------------------------------------------------------------------------------- Multi-Disciplinary Care Plan Details Patient Name: Date of Service: 04/20/2022. 04/18/2022 8:30 A M Medical Record Number: Donna [name] Patient Account Number: 04/20/2022 Date of Birth/Sex: Treating RN: 04-May-1966 (56 y.o. [id number] Primary Care Latarsha Zani: [id number] Other Clinician: Referring Zyron Deeley: Treating Aniko Finnigan/Extender: 13/12/1965 in [name]: 18 Active Inactive Electronic Signature(s) Signed: 04/18/2022 4:57:16 PM By:  Donna Danker RN, BSN Entered By: Donna [name] on 04/18/2022 08:47:21 -------------------------------------------------------------------------------- Pain Assessment Details Patient Name: Date of Service: Donna [name], Donna [name] 04/18/2022 8:30 A M Medical Record Number: 04/20/2022 Patient Account Number: Donna [name] Date of Birth/Sex: Treating RN: Jan 04, 1966 (56 y.o. F) Primary Care Aleasha Fregeau: [id number] Other Clinician: Referring Crissie Aloi: Treating Kwasi Joung/Extender: 13/12/1965 in [name]: 18 Active Problems Location of Pain Severity and Description of Pain Patient Has Paino No Site Locations Pain Management and  Medication Current Pain Management: Electronic Signature(s) Signed: 04/18/2022 4:51:17 PM By: Thayer Dallas Entered By: Thayer Dallas on 04/18/2022 08:33:10 -------------------------------------------------------------------------------- Patient/Caregiver Education Details Patient Name: Date of Service: Donna [name] 8/30/2023andnbsp8:30 A M Medical Record Number: [id number] Patient Account Number: [id number] Date of Birth/Gender: Treating RN: 1966/01/09 (56 y.o. Donna [name] Primary Care Physician: Donna [name] Other Clinician: Referring Physician: Treating Physician/Extender: Donna [name]: 18 Education Assessment Education Provided To: Patient Education Topics Provided Wound/Skin Impairment: Handouts: Skin Care Do's and Dont's Methods: Explain/Verbal Responses: Reinforcements needed Electronic Signature(s) Signed: 04/18/2022 4:57:16 PM By: Donna Stall RN, BSN Entered By: Donna [name] on 04/18/2022 08:49:20 -------------------------------------------------------------------------------- Wound Assessment Details Patient Name: Date of Service: Donna [name] 04/18/2022 8:30 A M Medical Record Number: [id number] Patient Account Number: [id number] Date of  Birth/Sex: Treating RN: 07/24/1966 (56 y.o. F) Primary Care Gwenette Wellons: Donna [name] Other Clinician: Referring Inessa Wardrop: Treating Chyla Schlender/Extender: Donna [name]: 18 Wound Status Wound Number: 2 Primary Etiology: Diabetic Wound/Ulcer of the Lower Extremity Wound Location: Right T Great oe Wound Status: Open Wounding Event: Blister Comorbid History: Hypertension, Type II Diabetes, Neuropathy Date Acquired: 11/22/2021 Weeks Of [name]: 18 Clustered Wound: No Photos Wound Measurements Length: (cm) Width: (cm) Depth: (cm) Area: (cm) Volume: (cm) 0 % Reduction in Area: 100% 0 % Reduction in Volume: 100% 0 Epithelialization: Large (67-100%) 0 Tunneling: No 0 Undermining: No Wound Description Classification: Grade 2 Wound Margin: Distinct, outline attached Exudate Amount: None Present Foul Odor After Cleansing: No Slough/Fibrino No Wound Bed Granulation Amount: None Present (0%) Exposed Structure Necrotic Amount: None Present (0%) Fascia Exposed: No Fat Layer (Subcutaneous Tissue) Exposed: No Tendon Exposed: No Muscle Exposed: No Joint Exposed: No Bone Exposed: No Electronic Signature(s) Signed: 04/18/2022 4:51:17 PM By: Thayer Dallas Entered By: Thayer Dallas on 04/18/2022 08:37:41 -------------------------------------------------------------------------------- Vitals Details Patient Name: Date of Service: Donna [name]. 04/18/2022 8:30 A M Medical Record Number: [id number] Patient Account Number: [id number] Date of Birth/Sex: Treating RN: 1965/09/08 (56 y.o. F) Primary Care Westly Hinnant: Donna [name] Other Clinician: Referring Shellie Rogoff: Treating Hernando Reali/Extender: Donna [name]: 18 Vital Signs Time Taken: 08:32 Temperature (F): 98.2 Height (in): 66 Pulse (bpm): 81 Weight (lbs): 220 Respiratory Rate (breaths/min): 18 Body Mass Index (BMI): 35.5 Blood  Pressure (mmHg): 115/73 Reference Range: 80 - 120 mg / dl Electronic Signature(s) Signed: 04/18/2022 4:51:17 PM By: Thayer Dallas Entered By: Thayer Dallas on 04/18/2022 08:32:57

## 2022-04-18 NOTE — Progress Notes (Addendum)
Donna [name], Laquinta R. ([id number]) Visit Report for 04/18/2022 Chief Complaint Document Details Patient Name: Date of Service: Donna [name], Donna R. 04/18/2022 8:30 A M Medical Record Number: [id number] Patient Account Number: [id number] Date of Birth/Sex: Treating RN: 12/20/1965 (56 y.o. F) Primary Care Provider: Hillard Dankerrawford, Elizabeth Other Clinician: Referring Provider: Treating Provider/Extender: Jolee EwingStone III, Osman Calzadilla Crawford, Elizabeth Weeks in Treatment: 18 Information Obtained from: Patient Chief Complaint Right great toe ulcer Electronic Signature(s) Signed: 04/18/2022 8:37:55 AM By: Lenda KelpStone III, Rainer Mounce PA-C Entered By: Lenda KelpStone III, Misha Antonini on 04/18/2022 08:37:55 -------------------------------------------------------------------------------- HPI Details Patient Name: Date of Service: Donna [name], Donna R. 04/18/2022 8:30 A M Medical Record Number: [id number] Patient Account Number: [id number] Date of Birth/Sex: Treating RN: 04/04/1966 (56 y.o. 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 She tells me that since at least last summer she has had a progressive callused area that has opened closed but is never really healed. She has a history of calluses on her feet. She is a diabetic with neuropathy. She was referred to Dr. Allena KatzPatel at [facility] who felt that this might be a large plantar wart she was treated with laser for 2 treatments and then subsequently with ammonium lactate none of this is apparently made any difference according to the patient. She has a very odd looking area with thick skin and callus and an almost crossed like area of open tissue. She also has erythema above this which she says is chronic. The other  interesting part of her recent history is recurrent cellulitis on the right lower leg although this is separated from the area we are looking at. She has had trips to see infectious disease has been on Keflex for as long as 20 days. This is presumed to be strep. She saw Dr. Renold DonVu Past medical history includes type 2 diabetes, recurrent cellulitis of the right leg, right Achilles tendon repair a year ago for spontaneous rupture ABI in our clinic was 1.09 on the right 2/17; patient admitted the clinic last week had a thick callused area on the right lateral heel she is a diabetic with neuropathy. She had thick callus and across like area of open wound. I aggressively debrided this area to remove most of the callus and gave her Hydrofera Blue and heel off loader, heel cup and she has been wearing this religiously trying to keep the pressure off the area. She comes in today with a cross-like area of injury has healed she has a new open area that I do not remember from last time. Still some dry flaking skin and callus but not nearly as much as last week at which time I had to use a scalpel to remove this. 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 She came in 2 weeks ago with a very thick hyperkeratotic callused area on her right lateral heel. There was a "crossed" shaped area of split tissue with an open wound at the bottom. This had previously been treated as a possible wart without improvement. I removed all of the callus when she first came in here. This is mostly epithelialized over still thick skin but nothing like it was. She has 1 small open area that has not healed she has been more religious about offloading the area 10/27/2020 upon evaluation today  patient appears to be doing well with regard to her wound although she does still apparently appear to have something still open in the base of the wound. Fortunately there is no evidence of infection at this time which is great  news and in general I feel like she is doing decently well though she has a lot of callus buildup she is using the offloading shoe for her heel. At home she does not always wear this she tells me. Generally she is using flip-flops there. Of note her primary care provider did give her Keflex yesterday for cellulitis of this leg but has nothing to do with the heel location. 3/17; the patient's area actually looks a lot better. There is no open wound here. Much less callus. She was given a prescription for 40% urea cream I am not sure that really is required this week. She is going to require less friction on this area. I think she is going to need a different type of shoe going forward Readmission: 12-13-2028 upon evaluation today patient presents for reevaluation in the clinic she was last seen by Dr. Leanord Hawking on 11-03-2020 when she was discharged this was due to a crack on her heel. Currently she is actually having an issue with her great toe on the right foot. She tells me that this has been present currently for a couple of weeks since around November 22, 2021. She has had a lot of callus over this area in general but not an open wound until that time. Subsequently this has more recently opened and has Become a greater concern she is also noted some odor which is disconcerting to her. She tells me that her greatest concern is that "she does not lose this toe". Again I completely understand her concern in this regard I definitely think we need to be aggressive in treating this and try to get things better. She is very Adult nurse. Patient does have a history of diabetes mellitus type 2 with diabetic neuropathy. She also tells me that she does have a history as well of hypertension which is pretty well controlled. She has not yet had an x-ray of the area and has not been on any antibiotics. Both are things I think we will need to do today. 12-20-2021 upon evaluation today patient appears to be doing well with  regard to her toe ulcer all things considered. She has been tolerating the dressing changes without complication. I do have her x-ray as well as her culture for review today the culture did so Staphylococcus aureus no MRSA noted which was great news susceptible to doxycycline which is what I did place her on last week. Overall the odor that she was noticing has also cleared which is great news. With regard to the x-ray there does not appear to be any signs of osteomyelitis or bone erosion at this point which is also great news in general and very pleased with where things stand. 12-27-2021 upon evaluation today patient appears to be doing well with regard to her wound this is actually measuring significantly better which is great news. Overall I am extremely pleased with where we stand today. No fevers, chills, nausea, vomiting, or diarrhea. 01-03-2022 upon evaluation today patient's wound is actually showing signs of significant improvement which is also news. I am very pleased with what we are seeing there is a little bit of callus buildup still but we are going to work on that today otherwise she seems to be making excellent  progress which is great news. 01-10-2022 upon evaluation today patient appears to be doing well currently in regard to her toe ulcer. She has been tolerating the dressing changes without complication. Fortunately there does not appear to be any evidence of active infection locally or systemically which is great news. No fevers, chills, nausea, vomiting, or diarrhea. 01-17-2022 upon evaluation today patient's toe was actually doing about the same this week there is not a tremendous improvement in size but overall the appearance of the wound is better it is much flatter I do think the [facility] has done well in that regard. We will still wait to hear back on the skin substitute. With that being said I do believe that the patient could benefit from again the skin substitute to try  to help get this to heal much more effectively and quickly in the meantime I am trying to help flatten this out which I think is doing a pretty good job.. 6/7; wound on the plantar right great toe over the distal phalanx. Wound is slightly smaller but looks healthier there is rims of epithelialization she still has some callus on the toe indicative of inadequate pressure/friction relief. I talked to her about making sure in the forefoot offloading boot that the weight stays on her heel in stance phase 01-31-2022 upon evaluation patient's wound is still continuing to show signs of really minimal progress unfortunately. I actually believe that she may need to go into the total contact cast in order to get this to heal and I discussed that with her today as well. Obviously this has been a little bit of a barrier for her not because she does not want to do it and does not want to get healed but rather because it is going to prevent her from being able to drive which is obviously going to be a significant quality-of-life issue with what she does in the position she is in at work. 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 I do believe she could work with the cast on but the issue simply is that she cannot drive to get to work. Otherwise should be able to walk and do what she needs to do. 02-07-2022 upon evaluation today patient appears to be doing well with regard to her wounds. She has been tolerating the dressing changes without complication. Fortunately I do not see any signs of infection although the wound itself is not significantly smaller as of yet. This is something that we are trying to work on I do believe a total contact cast would be the right way to go. I discussed this with the patient today as well. She is actually in agreement with giving this a trial although she is a little nervous about it being that she is  claustrophobic. 6/23; patient presents for obligatory cast exchange. She reports that her heel feels sore from the total contact cast. On that she had no issues. 02-14-2022 upon evaluation today patient's wound is actually showing signs of excellent improvement. There does not appear to be any signs of active infection at this time and overall the toe looks amazing in just 1 week's time. We have come down almost half compared to where we were before size wise and this is also him as well. Overall I think that the casting has done extremely good for her and the toe in general looks significantly improved. I think this week  since we want to have to mess with the wound bed itself just the callus around she will likely have even better improvement come next time. 02-21-2022 upon evaluation today patient appears to be doing well with regard to her toe. She is actually showing signs of improvement this is a little bit slower than she would like to see but nonetheless we are seeing significant changes in the right direction in my opinion. Fortunately she is also developing a lot less callus on the end of the toe which is helpful for her as well in my opinion. 02-28-2022 upon evaluation today patient appears to be doing well currently in regard to her wound. She has been tolerating the dressing changes. Fortunately I do not see any signs of infection I think the cast is doing an awesome job here. No fevers, chills, nausea, vomiting, or diarrhea. 03-07-2022 upon evaluation today patient appears to be doing well with regard to her wound although the collagen is very stuck and dry on the area on the tip of her toe at this point. Nonetheless I discussed with her that I am a little reluctant to put the cast on this week due to what appeared to been a blister along the left side of her toe laterally. Nonetheless this has been a little worried about the possibility of infection and I do not see anything obvious I think  that we may not want to put the cast on this week just to be on the safe side she voiced understanding. With that being said if were not to use the cast she is going to need to make sure to have still some pressure relief I think making gauze bolster with 3 gauze up underneath the toe is going to do a good job here. We will roll and tape this and then subsequently anchored underneath the big toe should be using the front offloading shoe as well. 7/26; patient I know from a previous stay in this clinic. Type II diabetic with a wound on her right plantar great toe. She was in a total contact cast to 2 weeks ago the wound was close to closed last week according to our intake nurse. We put Xeroform and gauze on this that the patient has been changing her self. She has a forefoot offloading boot. 03-21-2022 upon a variable evaluation today patient appears to be doing somewhat poorly in regard to callus buildup at this time. Fortunately I do not see any evidence of overall worsening but this is just still open. No fevers, chills, nausea, vomiting, or diarrhea. I think we may have to go back into the cast next week if this is not significantly improved by that time. 03-28-2022 upon evaluation today patient's wound is actually showing signs of significant improvement which is great news. The cast over the past week he has done a great job. This is so small however but still has a little bit of a deeper area that I am concerned about the possibility of it drying out too much under the cast that is what we saw today and callused over. Subsequently I did remove the callus. 04-11-2022 upon evaluation today patient appears to still have a lot of callus buildup today. She was sick with COVID last weeks has been 2 weeks since have seen her. This is what accounts for some of the callus buildup in my opinion. With that being said she tells me she was not nearly as active working from home with COVID as  she can go in.  Nonetheless I do believe still that based on what we are seeing this is still too much friction is likely occurring around the toe area. 04-18-2022 upon evaluation today patient appears to be doing excellent in regard to her wound in fact this appears to be completely healed which is great news. Fortunately I do not see any evidence of active infection locally or systemically at this time which is excellent as well. No fevers, chills, nausea, vomiting, or diarrhea. Electronic Signature(s) Signed: 04/18/2022 8:54:56 AM By: Lenda Kelp PA-C Entered By: Lenda Kelp on 04/18/2022 08:54:55 -------------------------------------------------------------------------------- Physical Exam Details Patient Name: Date of Service: Donna [name], Donna [name] 04/18/2022 8:30 A M Medical Record Number: [id number] Patient Account Number: [id number] Date of Birth/Sex: Treating RN: March 08, 1966 (56 y.o. F) Primary Care Provider: Hillard Danker Other Clinician: Referring Provider: Treating Provider/Extender: [name] in Treatment: 18 Constitutional Well-nourished and well-hydrated in no acute distress. Respiratory normal breathing without difficulty. Psychiatric this patient is able to make decisions and demonstrates good insight into disease process. Alert and Oriented x 3. pleasant and cooperative. Notes Upon inspection patient's wound bed actually showed signs of good granulation and epithelization at this point there does not appear to be anything open which is great news she is noted no drainage at home either. Electronic Signature(s) Signed: 04/18/2022 8:55:26 AM By: Lenda Kelp PA-C Entered By: Lenda Kelp on 04/18/2022 08:55:26 -------------------------------------------------------------------------------- Physician Orders Details Patient Name: Date of Service: CATHERIN, DOORN 04/18/2022 8:30 A M Medical Record Number: [id number] Patient Account Number:  [id number] Date of Birth/Sex: Treating RN: 06-Jul-1966 (56 y.o. [name] Primary Care Provider: Hillard Danker Other Clinician: Referring Provider: Treating Provider/Extender: [name] in Treatment: [id number] Verbal / Phone Orders: No Diagnosis Coding ICD-10 Coding Code Description E11.621 Type 2 diabetes mellitus with foot ulcer L97.518 Non-pressure chronic ulcer of other part of right foot with other specified severity E11.42 Type 2 diabetes mellitus with diabetic polyneuropathy I10 Essential (primary) hypertension [facility]charge From [facility] - Continue to use urea cream daily. Closely monitor closed area to right toe. Please wear good fitting shoes. Electronic Signature(s) Signed: 04/18/2022 4:57:16 PM By: Shawn Stall RN, BSN Signed: 04/18/2022 6:03:16 PM By: Lenda Kelp PA-C Entered By: Shawn Stall on 04/18/2022 08:49:06 -------------------------------------------------------------------------------- Problem List Details Patient Name: Date of Service: Donna [name] 04/18/2022 8:30 A M Medical Record Number: [id number] Patient Account Number: [id number] Date of Birth/Sex: Treating RN: [birth year] (56 y.o. F) Primary Care Provider: Hillard Danker Other Clinician: Referring Provider: Treating Provider/Extender: [name] in Treatment: 18 Active Problems ICD-10 Encounter Code Description Active Date MDM Diagnosis E11.621 Type 2 diabetes mellitus with foot ulcer 12/13/2021 No Yes L97.518 Non-pressure chronic ulcer of other part of right foot with other specified 03/14/2022 No Yes severity E11.42 Type 2 diabetes mellitus with diabetic polyneuropathy 12/13/2021 No Yes I10 Essential (primary) hypertension 12/13/2021 No Yes Inactive Problems ICD-10 Code Description Active Date Inactive Date L97.812 Non-pressure chronic ulcer of other part of right lower leg with  fat layer exposed 12/13/2021 12/13/2021 Resolved Problems Electronic Signature(s) Signed: 04/18/2022 8:37:45 AM By: Lenda Kelp PA-C Entered By: Lenda Kelp on 04/18/2022 08:37:45 -------------------------------------------------------------------------------- Progress Note Details Patient Name: Date of Service: Donna [name], Donna [name] 04/18/2022 8:30 A M Medical Record Number: [id number] Patient Account Number: [id number] Date of Birth/Sex: Treating RN: 12/27/1965 (56 y.o. 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 She tells me that since at least last summer she has had a progressive callused area that has opened closed but is never really healed. She has a history of calluses on her feet. She is a diabetic with neuropathy. She was referred to Dr. Allena Katz at [facility] who felt that this might be a large plantar wart she was treated with laser for 2 treatments and then subsequently with ammonium lactate none of this is apparently made any difference according to the patient. She has a very odd looking area with thick skin and callus and an almost crossed like area of open tissue. She also has erythema above this which she says is chronic. The other interesting part of her recent history is recurrent cellulitis on the right lower leg although this is separated from the area we are looking at. She has had trips to see infectious disease has been on Keflex for as long as 20 days. This is presumed to be strep. She saw Dr. Renold Don Past medical history includes type 2 diabetes, recurrent cellulitis of the right leg, right Achilles tendon repair  a year ago for spontaneous rupture ABI in our clinic was 1.09 on the right 2/17; patient admitted the clinic last week had a thick callused area on the right lateral heel she is a diabetic with neuropathy. She had thick callus and across like area of open wound. I aggressively debrided this area to remove most of the callus and gave her Hydrofera Blue and heel off loader, heel cup and she has been wearing this religiously trying to keep the pressure off the area. She comes in today with a cross-like area of injury has healed she has a new open area that I do not remember from last time. Still some dry flaking skin and callus but not nearly as much as last week at which time I had to use a scalpel to remove this. 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 She came in 2 weeks ago with a very thick hyperkeratotic callused area on her right lateral heel. There was a "crossed" shaped area of split tissue with an open wound at the bottom. This had previously been treated as a possible wart without improvement. I removed all of the callus when she first came in here. This is mostly epithelialized over still thick skin but nothing like it was. She has 1 small open area that has not healed she has been more religious about offloading the area 10/27/2020 upon evaluation today patient appears to be doing well with regard to her wound although she does still apparently appear to have something still open in the base of the wound. Fortunately there is no evidence of infection at this time which is great news and in general I feel like she is doing decently well though she has a lot of callus buildup she is using the offloading shoe for her heel. At home she does not always wear this she tells me. Generally she is using flip-flops there. Of note her primary care provider did give her Keflex yesterday for cellulitis of this leg but has nothing to do with the heel location. 3/17; the patient's  area  actually looks a lot better. There is no open wound here. Much less callus. She was given a prescription for 40% urea cream I am not sure that really is required this week. She is going to require less friction on this area. I think she is going to need a different type of shoe going forward Readmission: 12-13-2028 upon evaluation today patient presents for reevaluation in the clinic she was last seen by Dr. Leanord Hawking on 11-03-2020 when she was discharged this was due to a crack on her heel. Currently she is actually having an issue with her great toe on the right foot. She tells me that this has been present currently for a couple of weeks since around November 22, 2021. She has had a lot of callus over this area in general but not an open wound until that time. Subsequently this has more recently opened and has Become a greater concern she is also noted some odor which is disconcerting to her. She tells me that her greatest concern is that "she does not lose this toe". Again I completely understand her concern in this regard I definitely think we need to be aggressive in treating this and try to get things better. She is very Adult nurse. Patient does have a history of diabetes mellitus type 2 with diabetic neuropathy. She also tells me that she does have a history as well of hypertension which is pretty well controlled. She has not yet had an x-ray of the area and has not been on any antibiotics. Both are things I think we will need to do today. 12-20-2021 upon evaluation today patient appears to be doing well with regard to her toe ulcer all things considered. She has been tolerating the dressing changes without complication. I do have her x-ray as well as her culture for review today the culture did so Staphylococcus aureus no MRSA noted which was great news susceptible to doxycycline which is what I did place her on last week. Overall the odor that she was noticing has also cleared which is great news.  With regard to the x-ray there does not appear to be any signs of osteomyelitis or bone erosion at this point which is also great news in general and very pleased with where things stand. 12-27-2021 upon evaluation today patient appears to be doing well with regard to her wound this is actually measuring significantly better which is great news. Overall I am extremely pleased with where we stand today. No fevers, chills, nausea, vomiting, or diarrhea. 01-03-2022 upon evaluation today patient's wound is actually showing signs of significant improvement which is also news. I am very pleased with what we are seeing there is a little bit of callus buildup still but we are going to work on that today otherwise she seems to be making excellent progress which is great news. 01-10-2022 upon evaluation today patient appears to be doing well currently in regard to her toe ulcer. She has been tolerating the dressing changes without complication. Fortunately there does not appear to be any evidence of active infection locally or systemically which is great news. No fevers, chills, nausea, vomiting, or diarrhea. 01-17-2022 upon evaluation today patient's toe was actually doing about the same this week there is not a tremendous improvement in size but overall the appearance of the wound is better it is much flatter I do think the [facility] has done well in that regard. We will still wait to hear back on the skin substitute. With that  being said I do believe that the patient could benefit from again the skin substitute to try to help get this to heal much more effectively and quickly in the meantime I am trying to help flatten this out which I think is doing a pretty good job.. 6/7; wound on the plantar right great toe over the distal phalanx. Wound is slightly smaller but looks healthier there is rims of epithelialization she still has some callus on the toe indicative of inadequate pressure/friction relief. I  talked to her about making sure in the forefoot offloading boot that the weight stays on her heel in stance phase 01-31-2022 upon evaluation patient's wound is still continuing to show signs of really minimal progress unfortunately. I actually believe that she may need to go into the total contact cast in order to get this to heal and I discussed that with her today as well. Obviously this has been a little bit of a barrier for her not because she does not want to do it and does not want to get healed but rather because it is going to prevent her from being able to drive which is obviously going to be a significant quality-of-life issue with what she does in the position she is in at work. 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 I do believe she could work with the cast on but the issue simply is that she cannot drive to get to work. Otherwise should be able to walk and do what she needs to do. 02-07-2022 upon evaluation today patient appears to be doing well with regard to her wounds. She has been tolerating the dressing changes without complication. Fortunately I do not see any signs of infection although the wound itself is not significantly smaller as of yet. This is something that we are trying to work on I do believe a total contact cast would be the right way to go. I discussed this with the patient today as well. She is actually in agreement with giving this a trial although she is a little nervous about it being that she is claustrophobic. 6/23; patient presents for obligatory cast exchange. She reports that her heel feels sore from the total contact cast. On that she had no issues. 02-14-2022 upon evaluation today patient's wound is actually showing signs of excellent improvement. There does not appear to be any signs of active infection at this time and overall the toe looks amazing in just 1 week's time. We have come down almost  half compared to where we were before size wise and this is also him as well. Overall I think that the casting has done extremely good for her and the toe in general looks significantly improved. I think this week since we want to have to mess with the wound bed itself just the callus around she will likely have even better improvement come next time. 02-21-2022 upon evaluation today patient appears to be doing well with regard to her toe. She is actually showing signs of improvement this is a little bit slower than she would like to see but nonetheless we are seeing significant changes in the right direction in my opinion. Fortunately she is also developing a lot less callus on the end of the toe which is helpful for her as well in my opinion. 02-28-2022 upon evaluation today patient appears to be doing well currently in regard to her wound. She has  been tolerating the dressing changes. Fortunately I do not see any signs of infection I think the cast is doing an awesome job here. No fevers, chills, nausea, vomiting, or diarrhea. 03-07-2022 upon evaluation today patient appears to be doing well with regard to her wound although the collagen is very stuck and dry on the area on the tip of her toe at this point. Nonetheless I discussed with her that I am a little reluctant to put the cast on this week due to what appeared to been a blister along the left side of her toe laterally. Nonetheless this has been a little worried about the possibility of infection and I do not see anything obvious I think that we may not want to put the cast on this week just to be on the safe side she voiced understanding. With that being said if were not to use the cast she is going to need to make sure to have still some pressure relief I think making gauze bolster with 3 gauze up underneath the toe is going to do a good job here. We will roll and tape this and then subsequently anchored underneath the big toe should be using the  front offloading shoe as well. 7/26; patient I know from a previous stay in this clinic. Type II diabetic with a wound on her right plantar great toe. She was in a total contact cast to 2 weeks ago the wound was close to closed last week according to our intake nurse. We put Xeroform and gauze on this that the patient has been changing her self. She has a forefoot offloading boot. 03-21-2022 upon a variable evaluation today patient appears to be doing somewhat poorly in regard to callus buildup at this time. Fortunately I do not see any evidence of overall worsening but this is just still open. No fevers, chills, nausea, vomiting, or diarrhea. I think we may have to go back into the cast next week if this is not significantly improved by that time. 03-28-2022 upon evaluation today patient's wound is actually showing signs of significant improvement which is great news. The cast over the past week he has done a great job. This is so small however but still has a little bit of a deeper area that I am concerned about the possibility of it drying out too much under the cast that is what we saw today and callused over. Subsequently I did remove the callus. 04-11-2022 upon evaluation today patient appears to still have a lot of callus buildup today. She was sick with COVID last weeks has been 2 weeks since have seen her. This is what accounts for some of the callus buildup in my opinion. With that being said she tells me she was not nearly as active working from home with COVID as she can go in. Nonetheless I do believe still that based on what we are seeing this is still too much friction is likely occurring around the toe area. 04-18-2022 upon evaluation today patient appears to be doing excellent in regard to her wound in fact this appears to be completely healed which is great news. Fortunately I do not see any evidence of active infection locally or systemically at this time which is excellent as well. No  fevers, chills, nausea, vomiting, or diarrhea. Objective Constitutional Well-nourished and well-hydrated in no acute distress. Vitals Time Taken: 8:32 AM, Height: 66 in, Weight: 220 lbs, BMI: 35.5, Temperature: 98.2 F, Pulse: 81 bpm, Respiratory Rate: 18 breaths/min,  Blood Pressure: 115/73 mmHg. Respiratory normal breathing without difficulty. Psychiatric this patient is able to make decisions and demonstrates good insight into disease process. Alert and Oriented x 3. pleasant and cooperative. General Notes: Upon inspection patient's wound bed actually showed signs of good granulation and epithelization at this point there does not appear to be anything open which is great news she is noted no drainage at home either. Integumentary (Hair, Skin) Wound #2 status is Open. Original cause of wound was Blister. The date acquired was: 11/22/2021. The wound has been in treatment 18 weeks. The wound is located on the Right T Great. The wound measures 0cm length x 0cm width x 0cm depth; 0cm^2 area and 0cm^3 volume. There is no tunneling or undermining oe noted. There is a none present amount of drainage noted. The wound margin is distinct with the outline attached to the wound base. There is no granulation within the wound bed. There is no necrotic tissue within the wound bed. Assessment Active Problems ICD-10 Type 2 diabetes mellitus with foot ulcer Non-pressure chronic ulcer of other part of right foot with other specified severity Type 2 diabetes mellitus with diabetic polyneuropathy Essential (primary) hypertension Plan Discharge From [facility]: Discharge from Wound Care Center - Continue to use urea cream daily. Closely monitor closed area to right toe. Please wear good fitting shoes. 1. I am good recommend currently that we go ahead and continue with the wound care measures as before and the patient is in agreement with plan. Fortunately I do not see any evidence of anything open this  appears to be completely closed and I am going to recommend appropriate offloading. This includes the use of a tennis shoe I really do not want anything such as a slip on shoes simply because it does cause more rubbing. 2. She should continue with the urea cream once a day at night I think this is can keep the callus nice and soft and keep it from building up like it has been in the past. We will see the patient back for follow-up visit as needed. Electronic Signature(s) Signed: 04/18/2022 8:56:47 AM By: Lenda Kelp PA-C Entered By: Lenda Kelp on 04/18/2022 08:56:47 -------------------------------------------------------------------------------- SuperBill Details Patient Name: Date of Service: Donna [name], Donna [name] 04/18/2022 Medical Record Number: [id number] Patient Account Number: [id number] Date of Birth/Sex: Treating RN: February 27, 1966 (56 y.o. [name] Primary Care Provider: Hillard Danker Other Clinician: Referring Provider: Treating Provider/Extender: [name] in Treatment: 18 Diagnosis Coding ICD-10 Codes Code Description E11.621 Type 2 diabetes mellitus with foot ulcer L97.518 Non-pressure chronic ulcer of other part of right foot with other specified severity E11.42 Type 2 diabetes mellitus with diabetic polyneuropathy I10 Essential (primary) hypertension Facility Procedures CPT4 Code: [id number] Description: 99213 - WOUND CARE VISIT-LEV 3 EST PT Modifier: Quantity: 1 Physician Procedures : CPT4 Code Description Modifier [id number] - WC PHYS LEVEL 3 - EST PT ICD-10 Diagnosis Description E11.621 Type 2 diabetes mellitus with foot ulcer L97.518 Non-pressure chronic ulcer of other part of right foot with other specified severity E11.42  Type 2 diabetes mellitus with diabetic polyneuropathy I10 Essential (primary) hypertension Quantity: 1 Electronic Signature(s) Signed: 04/18/2022 8:57:24 AM By: Lenda Kelp PA-C Entered By:  Lenda Kelp on 04/18/2022 08:57:24

## 2022-04-19 ENCOUNTER — Other Ambulatory Visit (HOSPITAL_COMMUNITY): Payer: Self-pay

## 2022-04-19 MED ORDER — METFORMIN HCL 1000 MG PO TABS
ORAL_TABLET | Freq: Two times a day (BID) | ORAL | 3 refills | Status: DC
  Filled 2022-04-19: qty 180, 90d supply, fill #0
  Filled 2022-07-26: qty 180, 90d supply, fill #1
  Filled 2022-11-14: qty 180, 90d supply, fill #2
  Filled 2023-02-18: qty 180, 90d supply, fill #3

## 2022-04-24 ENCOUNTER — Encounter: Payer: Self-pay | Admitting: Internal Medicine

## 2022-04-24 ENCOUNTER — Ambulatory Visit (INDEPENDENT_AMBULATORY_CARE_PROVIDER_SITE_OTHER): Payer: 59 | Admitting: Internal Medicine

## 2022-04-24 VITALS — BP 110/80 | HR 75 | Temp 98.6°F | Ht 66.0 in | Wt 220.0 lb

## 2022-04-24 DIAGNOSIS — Z1211 Encounter for screening for malignant neoplasm of colon: Secondary | ICD-10-CM | POA: Diagnosis not present

## 2022-04-24 DIAGNOSIS — I10 Essential (primary) hypertension: Secondary | ICD-10-CM | POA: Diagnosis not present

## 2022-04-24 DIAGNOSIS — Z0000 Encounter for general adult medical examination without abnormal findings: Secondary | ICD-10-CM

## 2022-04-24 DIAGNOSIS — E118 Type 2 diabetes mellitus with unspecified complications: Secondary | ICD-10-CM | POA: Diagnosis not present

## 2022-04-24 DIAGNOSIS — F331 Major depressive disorder, recurrent, moderate: Secondary | ICD-10-CM | POA: Diagnosis not present

## 2022-04-24 LAB — POCT GLYCOSYLATED HEMOGLOBIN (HGB A1C): Hemoglobin A1C: 8 % — AB (ref 4.0–5.6)

## 2022-04-24 NOTE — Progress Notes (Signed)
   Subjective:   Patient ID: Donna [name], female    DOB: 1965-09-11, 56 y.o.   MRN: [id number]  HPI The patient is here for physical.  PMH, [facility], social history reviewed and updated  Review of Systems  Objective:  Physical Exam  Vitals:   04/24/22 1556  BP: 110/80  Pulse: 75  Temp: 98.6 F (37 C)  TempSrc: Oral  SpO2: 94%  Weight: 220 lb (99.8 kg)  Height: 5\' 6"  (1.676 m)    Assessment & Plan:

## 2022-04-25 NOTE — Assessment & Plan Note (Signed)
POC HgA1c done today at 8.0 which is improved. She is still taking tresiba 110 units daily and jardiance 25 mg daily and metformin 1000 mg bid and we will continue these. Follow up 6 months.

## 2022-04-25 NOTE — Assessment & Plan Note (Signed)
BP at goal on lisinopril/hctz 20/25 and recent BMP stable. Continue same.

## 2022-04-25 NOTE — Assessment & Plan Note (Signed)
Flu shot yearly at work. Covid-19 counseled. Shingrix counseled declines. Tetanus up to date. Cologuard ordered. Mammogram up to date, pap smear up to date. Counseled about sun safety and mole surveillance. Counseled about the dangers of distracted driving. Given 10 year screening recommendations.

## 2022-04-25 NOTE — Assessment & Plan Note (Signed)
Taking zoloft 100 mg daily and stable. Will continue.

## 2022-05-13 ENCOUNTER — Other Ambulatory Visit: Payer: Self-pay | Admitting: Internal Medicine

## 2022-05-14 ENCOUNTER — Other Ambulatory Visit (HOSPITAL_COMMUNITY): Payer: Self-pay

## 2022-05-17 ENCOUNTER — Other Ambulatory Visit (HOSPITAL_COMMUNITY): Payer: Self-pay

## 2022-05-18 ENCOUNTER — Other Ambulatory Visit: Payer: Self-pay | Admitting: Internal Medicine

## 2022-05-18 ENCOUNTER — Other Ambulatory Visit (HOSPITAL_COMMUNITY): Payer: Self-pay

## 2022-05-22 ENCOUNTER — Other Ambulatory Visit (HOSPITAL_COMMUNITY): Payer: Self-pay

## 2022-05-22 ENCOUNTER — Other Ambulatory Visit: Payer: Self-pay | Admitting: Internal Medicine

## 2022-05-22 NOTE — Telephone Encounter (Signed)
Patient has been out of insulin for 4 days

## 2022-05-23 ENCOUNTER — Other Ambulatory Visit (HOSPITAL_COMMUNITY): Payer: Self-pay

## 2022-05-23 MED ORDER — TRESIBA FLEXTOUCH 200 UNIT/ML ~~LOC~~ SOPN
110.0000 [IU] | PEN_INJECTOR | Freq: Every day | SUBCUTANEOUS | 2 refills | Status: DC
  Filled 2022-05-23: qty 15, 27d supply, fill #0
  Filled 2022-06-28: qty 15, 27d supply, fill #1
  Filled 2022-07-26: qty 15, 27d supply, fill #2

## 2022-05-23 NOTE — Telephone Encounter (Signed)
Please advise 

## 2022-05-24 ENCOUNTER — Other Ambulatory Visit (HOSPITAL_COMMUNITY): Payer: Self-pay

## 2022-05-24 MED ORDER — SERTRALINE HCL 100 MG PO TABS
ORAL_TABLET | Freq: Every day | ORAL | 3 refills | Status: DC
  Filled 2022-05-24: qty 90, 90d supply, fill #0
  Filled 2022-08-27: qty 90, 90d supply, fill #1
  Filled 2022-11-26: qty 90, 90d supply, fill #2
  Filled 2023-02-18: qty 90, 90d supply, fill #3

## 2022-06-01 ENCOUNTER — Other Ambulatory Visit (HOSPITAL_COMMUNITY): Payer: Self-pay

## 2022-06-01 ENCOUNTER — Ambulatory Visit (INDEPENDENT_AMBULATORY_CARE_PROVIDER_SITE_OTHER): Payer: 59

## 2022-06-01 ENCOUNTER — Ambulatory Visit
Admission: EM | Admit: 2022-06-01 | Discharge: 2022-06-01 | Disposition: A | Discharge status: Home / Self Care | Payer: 59 | Attending: Physician Assistant | Admitting: Physician Assistant

## 2022-06-01 ENCOUNTER — Ambulatory Visit: Payer: 59

## 2022-06-01 ENCOUNTER — Telehealth: Payer: 59 | Admitting: Family Medicine

## 2022-06-01 DIAGNOSIS — M79674 Pain in right toe(s): Secondary | ICD-10-CM

## 2022-06-01 DIAGNOSIS — M7989 Other specified soft tissue disorders: Secondary | ICD-10-CM | POA: Diagnosis not present

## 2022-06-01 DIAGNOSIS — E11628 Type 2 diabetes mellitus with other skin complications: Secondary | ICD-10-CM

## 2022-06-01 DIAGNOSIS — L03115 Cellulitis of right lower limb: Secondary | ICD-10-CM

## 2022-06-01 DIAGNOSIS — L089 Local infection of the skin and subcutaneous tissue, unspecified: Secondary | ICD-10-CM

## 2022-06-01 MED ORDER — AMOXICILLIN-POT CLAVULANATE 875-125 MG PO TABS
1.0000 | ORAL_TABLET | Freq: Two times a day (BID) | ORAL | 0 refills | Status: DC
  Filled 2022-06-01: qty 20, 10d supply, fill #0

## 2022-06-01 MED ORDER — DOXYCYCLINE HYCLATE 100 MG PO CAPS
100.0000 mg | ORAL_CAPSULE | Freq: Two times a day (BID) | ORAL | 0 refills | Status: DC
  Filled 2022-06-01: qty 20, 10d supply, fill #0

## 2022-06-01 MED ORDER — CEFTRIAXONE SODIUM 1 G IJ SOLR
1.0000 g | Freq: Once | INTRAMUSCULAR | Status: AC
  Administered 2022-06-01: 1 g via INTRAMUSCULAR

## 2022-06-01 NOTE — ED Provider Notes (Signed)
[name]    CSN: [id number] Arrival date & time: 06/01/22  1326      History   Chief Complaint Chief Complaint  Patient presents with   Wound Infection    HPI Donna [name] is a 56 y.o. female.   Patient presents today with a 1 day history of swelling and discoloration of her right fifth toe.  She reports that she had a callus on this toe that had been there for a while and she went to work and after a day on her foot she noticed that her right pinky toe was discolored.  This has progressed significantly in the past 12 hours.  She denies any associated pain but states that she has significant neuropathy related to uncontrolled type 2 diabetes.  She has had a wound on her right great toe which has been managed by the wound care clinic/podiatry but she is unable to see them recently.  She denies any recent antibiotics.  Reports that yesterday her pain was 71 F but this has improved.  She did take Tylenol with last dose at 6 AM this morning.  Denies any nausea, vomiting, body aches, chest pain, shortness of breath.    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/13/2019   Diabetic neuropathy (HCC) 12/01/2014   Routine general medical examination at a health care facility 07/18/2013   Restless leg syndrome 06/02/2012   Diabetes mellitus type 2 with complications (HCC) 08/22/2010   Hyperlipidemia associated with type 2 diabetes mellitus (HCC) 08/22/2010   Morbid obesity (HCC) 08/22/2010   Depression 08/22/2010   Essential hypertension 08/22/2010   GERD (gastroesophageal reflux disease) 08/22/2010    Past Surgical History:  Procedure  Laterality Date   CHOLECYSTECTOMY  '96   Lap   ESI     lumbar spine, spinal injections   EXCISION HAGLUND'S DEFORMITY WITH ACHILLES TENDON REPAIR Right 07/23/2019   Procedure: Right Achilles Tendon Reconstruction, Excision of Haglund Deformity;  Surgeon: Toni Arthurs, MD;  Location: [facility];  Service: Orthopedics;  Laterality: Right;   GASTROC RECESSION EXTREMITY Right 07/23/2019   Procedure: Gastroc Recession;  Surgeon: Toni Arthurs, MD;  Location: [facility];  Service: Orthopedics;  Laterality: Right;   REDUCTION MAMMAPLASTY Bilateral    reduction mammoplasty bilaterally      OB History   No obstetric history on file.      Home Medications    Prior to Admission medications   Medication Sig Start Date End Date Taking? Authorizing Provider  amoxicillin-clavulanate (AUGMENTIN) 875-125 MG tablet Take 1 tablet by mouth every 12 (twelve) hours. 06/01/22  Yes Jayleana Colberg K, PA-C  atorvastatin (LIPITOR) 80 MG tablet TAKE 1 TABLET BY MOUTH ONCE A DAY 04/28/21 06/13/22  Myrlene Broker, MD  doxycycline (VIBRAMYCIN) 100 MG capsule Take 1 capsule (100 mg total) by mouth 2 (two) times daily for 14 days 06/01/22   Calel Pisarski, Noberto Retort, PA-C  FreeStyle Unistick II Lancets MISC Use to check blood sugars twice a day 03/16/21   Myrlene Broker, MD  gabapentin (NEURONTIN) 300 MG capsule TAKE 1 CAPSULE BY MOUTH  FOUR TIMES DAILY 11/13/21 11/13/22  Myrlene Broker, MD  glucose blood (FREESTYLE LITE) test strip Use as directed to test blood sugar twice daily 12/25/21 12/25/22  Myrlene Broker, MD  insulin degludec (TRESIBA FLEXTOUCH) 200 UNIT/ML FlexTouch Pen Inject 110 Units into the skin daily. 05/23/22   Myrlene Broker, MD  Insulin Pen Needle 31G X 5 MM MISC Use as directed once daily with tresiba 05/02/21   Myrlene Broker, MD  JARDIANCE 25 MG TABS tablet Take 1 tablet (25 mg total) by mouth daily (needs office visit) 12/13/21   Myrlene Broker,  MD  lisinopril-hydrochlorothiazide (ZESTORETIC) 20-25 MG tablet TAKE 1 TABLET BY MOUTH ONCE A DAY 02/15/22 02/15/23  Myrlene Broker, MD  metFORMIN (GLUCOPHAGE) 1000 MG tablet TAKE 1 TABLET BY MOUTH TWICE DAILY 04/19/22 04/19/23  Myrlene Broker, MD  NEOMYCIN-POLYMYXIN-HYDROCORTISONE (CORTISPORIN) 1 % SOLN OTIC solution Place 3 drops into both ears in the morning, at noon, and at bedtime. 02/27/21   Myrlene Broker, MD  sertraline (ZOLOFT) 100 MG tablet TAKE 1 TABLET BY MOUTH ONCE DAILY 05/24/22 05/24/23  Myrlene Broker, MD  triamcinolone (KENALOG) 0.1 % paste Use as directed 1 application in the mouth or throat 2 (two) times daily. 02/27/21   Myrlene Broker, MD  Insulin Glargine [facility]) 100 UNIT/ML Inject 0.5 mLs (50 Units total) into the skin 2 (two) times daily. 11/30/19 10/25/20  Myrlene Broker, MD    Family History Family History  Problem Relation Age of Onset   Other Father        CHF   Coronary artery disease Father    Cancer Father        renal cell carcinoma   Hypertension Father    Diabetes Father    Diabetes Mother    Coronary artery disease Mother    Heart attack Mother    Cancer Paternal Aunt        Breast   Breast cancer Paternal Aunt 97   Atrial fibrillation Other    Peripheral vascular disease Other    Heart disease Other     Social History Social History   Tobacco Use   Smoking status: Never   Smokeless tobacco: Never  Substance Use Topics   Alcohol use: No   Drug use: No     Allergies   Patient has no known allergies.   Review of Systems Review of Systems  Constitutional:  Positive for activity change and fever. Negative for appetite change and fatigue.  Gastrointestinal:  Negative for abdominal pain, diarrhea, nausea and vomiting.  Musculoskeletal:  Negative for arthralgias and myalgias.  Skin:  Positive for color change and wound.     Physical Exam Triage Vital Signs ED Triage Vitals  Enc Vitals Group      BP 06/01/22 1504 134/81     Pulse Rate 06/01/22 1504 93     Resp 06/01/22 1504 18     Temp 06/01/22 1504 99 F (37.2 C)     Temp Source 06/01/22 1504 Oral     SpO2 06/01/22 1504 100 %     Weight --      Height --      Head Circumference --      Peak Flow --      Pain Score 06/01/22 1510 0     Pain Loc --      Pain Edu? --      Excl. in GC? --    No data found.  Updated Vital Signs BP 134/81 (BP Location: Left Arm)   Pulse 93   Temp 99 F (37.2 C) (Oral)   Resp 18   LMP 09/27/2011   SpO2 100%   Visual Acuity Right Eye Distance:   Left Eye Distance:   Bilateral Distance:    Right Eye Near:   Left Eye Near:    Bilateral Near:     Physical Exam Vitals reviewed.  Constitutional:      General: She is awake. She is not in acute distress.    Appearance: Normal appearance. She is well-developed. She is not ill-appearing.     Comments: Very pleasant female appears stated age in no acute distress sitting comfortably in exam room  HENT:     Head: Normocephalic and atraumatic.  Cardiovascular:     Rate and Rhythm: Normal rate and regular rhythm.     Pulses:          Posterior tibial pulses are 2+ on the right side.     Heart sounds: Normal heart sounds, S1 normal and S2 normal. No murmur heard.    Comments: Unable to assess capillary refill of right toe due to discoloration Pulmonary:     Effort: Pulmonary effort is normal.     Breath sounds: Normal breath sounds. No wheezing, rhonchi or rales.     Comments: Clear to auscultation bilaterally Abdominal:     General: Bowel sounds are normal.     Palpations: Abdomen is soft.     Tenderness: There is no abdominal tenderness. There is no right CVA tenderness, left CVA tenderness, guarding or rebound.  Skin:    Comments: Blistering of right fifth toe with expression of serous/purulent fluid.  Psychiatric:        Behavior: Behavior is cooperative.          UC Treatments / Results  Labs (all labs ordered are  listed, but only abnormal results are displayed) Labs Reviewed - No data to display  EKG   Radiology DG Foot Complete Right  Result Date: 06/01/2022 CLINICAL DATA:  Right 5th toe swelling and discoloration with clinical signs of infection. EXAM: RIGHT FOOT COMPLETE - 3+ VIEW COMPARISON:  Right great toe dated 12/14/2021. FINDINGS: Mild diffuse distal soft tissue swelling. Fracture, dislocation, bone destruction, periosteal reaction or soft tissue gas. IMPRESSION: Mild diffuse distal soft tissue swelling without underlying bony abnormality. Electronically Signed   By: Claudie Revering M.D.   On: 06/01/2022 15:35    Procedures Procedures (including critical care time)  Medications Ordered in UC Medications  cefTRIAXone (ROCEPHIN) injection 1 g (1 g Intramuscular Given 06/01/22 1626)    Initial Impression / Assessment and Plan / UC Course  I have reviewed the triage vital signs and the nursing notes.  Pertinent labs & imaging results that were available during my care of the patient were reviewed by me and considered in my medical decision making (see chart for details).     Discussed with patient that I am concerned for gangrene or other rapidly progressing infection.  I recommended she go to the emergency room but patient declined to do this stating she is feeling better and just needs antibiotics.  She is afebrile nontachycardic clinic and reports that she has not had any antipyretics today.  I again discussed that I am concerned about the severity of the infection and recommended she go to the ER for IV antibiotics.  She continued to decline so x-ray was obtained that showed significant soft tissue swelling without  evidence of osteomyelitis or gas accumulation.  I contacted Dr. Marlinda Mike who reviewed the chart and recommended she go to the emergency room.  I again discussed that this is my recommendation given severity of infection as I am concerned for rapidly progressing infection requiring  IV antibiotics.  Patient again declined and stated that she will consider going later today when the ER is not as busy but refuses to go right now.  She was given 1 g of Rocephin and I sent a prescription for Augmentin/doxycycline.  We discussed that even though I am giving her medication it is my recommendation that she go to the emergency room immediately and not doing so could result in her losing her toe and developing an even worse infection.  She expressed understanding is still refused emergency evaluation at this time.  Final Clinical Impressions(s) / UC Diagnoses   Final diagnoses:  Type 2 diabetes mellitus with right diabetic foot infection (HCC)  Toe infection     Discharge Instructions      As we discussed, it is my recommendation that you go to the emergency room immediately.  I am very concerned about how this infection appears and I am concerned that it will continue to spread and potentially cause you to lose your toe if it is not addressed immediately.  Since you refused to go to the emergency room right now, we have given you 1 g of ceftriaxone.  I have called in antibiotics to the pharmacy.  Please follow-up with your wound care specialist soon as possible.  I strongly encourage you to go to the emergency room right now as we discussed.     ED Prescriptions     Medication Sig Dispense Auth. Provider   doxycycline (VIBRAMYCIN) 100 MG capsule Take 1 capsule (100 mg total) by mouth 2 (two) times daily for 14 days 20 capsule Jahna Liebert K, PA-C   amoxicillin-clavulanate (AUGMENTIN) 875-125 MG tablet Take 1 tablet by mouth every 12 (twelve) hours. 20 tablet Joydan Gretzinger, Noberto Retort, PA-C      PDMP not reviewed this encounter.   Jeani Hawking, PA-C 06/01/22 1630

## 2022-06-01 NOTE — Progress Notes (Signed)
Because Ms. Bourbon, I feel your condition warrants further evaluation and I recommend that you be seen in a face to face visit.   NOTE: There will be NO CHARGE for this eVisit   If you are having a true medical emergency please call 911.      For an urgent face to face visit, [facility] has seven urgent care centers for your convenience:     [facility] [place] Get Driving Directions [phone] [place], [place] [address]    [facility] [place] [facility]) Get Driving Directions [phone] [place], [place] [address]  [facility] [place] ([place]) Get Driving Directions [phone] [address] [place],  [place]  [address]  [facility] [place] [facility] [address] Get Driving Directions  [phone] [place],  [place] [address]   [facility] Get Driving Directions [phone] [place], [place], [place] [address]   [facility] Get Driving Directions  [phone] [address] [place], [place] [address]   [facility] [place] Get Driving Directions [phone] [address] [place], [place] [address]  Your MyChart E-visit questionnaire answers were reviewed by a board certified advanced clinical practitioner to complete your personal care plan based on your specific symptoms.  Thank you for using e-Visits.  I have provided 5 minutes of non face to face time during this encounter for chart review and documentation.

## 2022-06-01 NOTE — Discharge Instructions (Addendum)
As we discussed, it is my recommendation that you go to the emergency room immediately.  I am very concerned about how this infection appears and I am concerned that it will continue to spread and potentially cause you to lose your toe if it is not addressed immediately.  Since you refused to go to the emergency room right now, we have given you 1 g of ceftriaxone.  I have called in antibiotics to the pharmacy.  Please follow-up with your wound care specialist soon as possible.  I strongly encourage you to go to the emergency room right now as we discussed.

## 2022-06-01 NOTE — ED Triage Notes (Signed)
Pt presents with wound infection on right pinky with no known injury.

## 2022-06-02 ENCOUNTER — Emergency Department (HOSPITAL_COMMUNITY): Payer: 59

## 2022-06-02 ENCOUNTER — Inpatient Hospital Stay (HOSPITAL_COMMUNITY): Payer: 59 | Admitting: Anesthesiology

## 2022-06-02 ENCOUNTER — Encounter (HOSPITAL_COMMUNITY): Payer: Self-pay

## 2022-06-02 ENCOUNTER — Encounter (HOSPITAL_COMMUNITY)
Admission: EM | Disposition: A | Discharge status: Home / Self Care | Payer: Self-pay | Source: Home / Self Care | Attending: Internal Medicine

## 2022-06-02 ENCOUNTER — Inpatient Hospital Stay (HOSPITAL_COMMUNITY)
Admission: EM | Admit: 2022-06-02 | Discharge: 2022-06-04 | DRG: 617 | Disposition: A | Discharge status: Home / Self Care | Payer: 59 | Attending: Internal Medicine | Admitting: Internal Medicine

## 2022-06-02 ENCOUNTER — Other Ambulatory Visit: Payer: Self-pay

## 2022-06-02 DIAGNOSIS — E118 Type 2 diabetes mellitus with unspecified complications: Secondary | ICD-10-CM | POA: Diagnosis present

## 2022-06-02 DIAGNOSIS — I10 Essential (primary) hypertension: Secondary | ICD-10-CM | POA: Diagnosis not present

## 2022-06-02 DIAGNOSIS — Z794 Long term (current) use of insulin: Secondary | ICD-10-CM | POA: Diagnosis not present

## 2022-06-02 DIAGNOSIS — I96 Gangrene, not elsewhere classified: Secondary | ICD-10-CM | POA: Diagnosis not present

## 2022-06-02 DIAGNOSIS — Z7984 Long term (current) use of oral hypoglycemic drugs: Secondary | ICD-10-CM

## 2022-06-02 DIAGNOSIS — E785 Hyperlipidemia, unspecified: Secondary | ICD-10-CM | POA: Diagnosis not present

## 2022-06-02 DIAGNOSIS — L03115 Cellulitis of right lower limb: Secondary | ICD-10-CM | POA: Diagnosis present

## 2022-06-02 DIAGNOSIS — Z8249 Family history of ischemic heart disease and other diseases of the circulatory system: Secondary | ICD-10-CM

## 2022-06-02 DIAGNOSIS — M898X7 Other specified disorders of bone, ankle and foot: Secondary | ICD-10-CM | POA: Diagnosis not present

## 2022-06-02 DIAGNOSIS — E1142 Type 2 diabetes mellitus with diabetic polyneuropathy: Secondary | ICD-10-CM | POA: Diagnosis present

## 2022-06-02 DIAGNOSIS — M86071 Acute hematogenous osteomyelitis, right ankle and foot: Principal | ICD-10-CM

## 2022-06-02 DIAGNOSIS — M86171 Other acute osteomyelitis, right ankle and foot: Secondary | ICD-10-CM | POA: Diagnosis not present

## 2022-06-02 DIAGNOSIS — E1165 Type 2 diabetes mellitus with hyperglycemia: Secondary | ICD-10-CM | POA: Diagnosis not present

## 2022-06-02 DIAGNOSIS — E1169 Type 2 diabetes mellitus with other specified complication: Principal | ICD-10-CM | POA: Diagnosis present

## 2022-06-02 DIAGNOSIS — L84 Corns and callosities: Secondary | ICD-10-CM | POA: Diagnosis present

## 2022-06-02 DIAGNOSIS — M869 Osteomyelitis, unspecified: Secondary | ICD-10-CM | POA: Diagnosis present

## 2022-06-02 DIAGNOSIS — E1140 Type 2 diabetes mellitus with diabetic neuropathy, unspecified: Secondary | ICD-10-CM | POA: Diagnosis present

## 2022-06-02 DIAGNOSIS — M19071 Primary osteoarthritis, right ankle and foot: Secondary | ICD-10-CM | POA: Diagnosis not present

## 2022-06-02 DIAGNOSIS — L089 Local infection of the skin and subcutaneous tissue, unspecified: Secondary | ICD-10-CM | POA: Diagnosis not present

## 2022-06-02 DIAGNOSIS — E1152 Type 2 diabetes mellitus with diabetic peripheral angiopathy with gangrene: Secondary | ICD-10-CM | POA: Diagnosis present

## 2022-06-02 DIAGNOSIS — F331 Major depressive disorder, recurrent, moderate: Secondary | ICD-10-CM | POA: Diagnosis not present

## 2022-06-02 DIAGNOSIS — B9561 Methicillin susceptible Staphylococcus aureus infection as the cause of diseases classified elsewhere: Secondary | ICD-10-CM | POA: Diagnosis not present

## 2022-06-02 DIAGNOSIS — Z833 Family history of diabetes mellitus: Secondary | ICD-10-CM | POA: Diagnosis not present

## 2022-06-02 DIAGNOSIS — F32A Depression, unspecified: Secondary | ICD-10-CM | POA: Diagnosis not present

## 2022-06-02 DIAGNOSIS — F419 Anxiety disorder, unspecified: Secondary | ICD-10-CM | POA: Diagnosis present

## 2022-06-02 DIAGNOSIS — M7989 Other specified soft tissue disorders: Secondary | ICD-10-CM | POA: Diagnosis not present

## 2022-06-02 DIAGNOSIS — G8918 Other acute postprocedural pain: Secondary | ICD-10-CM | POA: Diagnosis not present

## 2022-06-02 DIAGNOSIS — Z79899 Other long term (current) drug therapy: Secondary | ICD-10-CM

## 2022-06-02 DIAGNOSIS — R600 Localized edema: Secondary | ICD-10-CM | POA: Diagnosis not present

## 2022-06-02 HISTORY — PX: AMPUTATION TOE: SHX6595

## 2022-06-02 LAB — BASIC METABOLIC PANEL
Anion gap: 8 (ref 5–15)
BUN: 15 mg/dL (ref 6–20)
CO2: 27 mmol/L (ref 22–32)
Calcium: 8.9 mg/dL (ref 8.9–10.3)
Chloride: 100 mmol/L (ref 98–111)
Creatinine, Ser: 0.6 mg/dL (ref 0.44–1.00)
GFR, Estimated: >60 mL/min (ref >60)
Glucose, Bld: 114 mg/dL — ABNORMAL HIGH (ref 70–99)
Potassium: 3.6 mmol/L (ref 3.5–5.1)
Sodium: 135 mmol/L (ref 135–145)

## 2022-06-02 LAB — CBC WITH DIFFERENTIAL/PLATELET
Abs Immature Granulocytes: 0.04 10*3/uL (ref 0.00–0.07)
Basophils Absolute: 0.1 10*3/uL (ref 0.0–0.1)
Basophils Relative: 0 %
Eosinophils Absolute: 0.1 10*3/uL (ref 0.0–0.5)
Eosinophils Relative: 1 %
HCT: 44.4 % (ref 36.0–46.0)
Hemoglobin: 14.4 g/dL (ref 12.0–15.0)
Immature Granulocytes: 0 %
Lymphocytes Relative: 11 %
Lymphs Abs: 1.5 10*3/uL (ref 0.7–4.0)
MCH: 30.2 pg (ref 26.0–34.0)
MCHC: 32.4 g/dL (ref 30.0–36.0)
MCV: 93.1 fL (ref 80.0–100.0)
Monocytes Absolute: 0.9 10*3/uL (ref 0.1–1.0)
Monocytes Relative: 7 %
Neutro Abs: 10.3 10*3/uL — ABNORMAL HIGH (ref 1.7–7.7)
Neutrophils Relative %: 81 %
Platelets: 284 10*3/uL (ref 150–400)
RBC: 4.77 MIL/uL (ref 3.87–5.11)
RDW: 13.2 % (ref 11.5–15.5)
WBC: 13 10*3/uL — ABNORMAL HIGH (ref 4.0–10.5)
nRBC: 0 % (ref 0.0–0.2)

## 2022-06-02 LAB — GLUCOSE, CAPILLARY
Glucose-Capillary: 83 mg/dL (ref 70–99)
Glucose-Capillary: 74 mg/dL (ref 70–99)
Glucose-Capillary: 135 mg/dL — ABNORMAL HIGH (ref 70–99)
Glucose-Capillary: 89 mg/dL (ref 70–99)

## 2022-06-02 LAB — LACTIC ACID, PLASMA
Lactic Acid, Venous: 1.2 mmol/L (ref 0.5–1.9)
Lactic Acid, Venous: 1.1 mmol/L (ref 0.5–1.9)

## 2022-06-02 SURGERY — AMPUTATION, TOE
Anesthesia: General | Site: Foot | Laterality: Right

## 2022-06-02 MED ORDER — 0.9 % SODIUM CHLORIDE (POUR BTL) OPTIME
TOPICAL | Status: DC | PRN
  Administered 2022-06-02: 1000 mL

## 2022-06-02 MED ORDER — INSULIN ASPART 100 UNIT/ML IJ SOLN: 0.0000 [IU] | Freq: Every day | INTRAMUSCULAR | Status: DC

## 2022-06-02 MED ORDER — CEFAZOLIN SODIUM-DEXTROSE 2-4 GM/100ML-% IV SOLN
INTRAVENOUS | Status: AC
  Filled 2022-06-02: qty 100

## 2022-06-02 MED ORDER — PROMETHAZINE HCL 25 MG/ML IJ SOLN: 6.2500 mg | INTRAMUSCULAR | Status: DC | PRN

## 2022-06-02 MED ORDER — FENTANYL CITRATE PF 50 MCG/ML IJ SOSY: 25.0000 ug | PREFILLED_SYRINGE | INTRAMUSCULAR | Status: DC | PRN

## 2022-06-02 MED ORDER — OXYCODONE HCL 5 MG/5ML PO SOLN: 5.0000 mg | Freq: Once | ORAL | Status: DC | PRN

## 2022-06-02 MED ORDER — LISINOPRIL-HYDROCHLOROTHIAZIDE 20-25 MG PO TABS: 1.0000 | ORAL_TABLET | Freq: Every day | ORAL | Status: DC

## 2022-06-02 MED ORDER — ONDANSETRON HCL 4 MG/2ML IJ SOLN
INTRAMUSCULAR | Status: AC
  Filled 2022-06-02: qty 2

## 2022-06-02 MED ORDER — LIDOCAINE-EPINEPHRINE (PF) 2 %-1:200000 IJ SOLN
INTRAMUSCULAR | Status: DC | PRN
  Administered 2022-06-02: 10 mL

## 2022-06-02 MED ORDER — ONDANSETRON HCL 4 MG PO TABS: 4.0000 mg | ORAL_TABLET | Freq: Four times a day (QID) | ORAL | Status: DC | PRN

## 2022-06-02 MED ORDER — SODIUM CHLORIDE 0.9 % IR SOLN
Status: DC | PRN
  Administered 2022-06-02: 3000 mL

## 2022-06-02 MED ORDER — DEXTROSE 50 % IV SOLN
INTRAVENOUS | Status: DC | PRN
  Administered 2022-06-02: 25 mL via INTRAVENOUS

## 2022-06-02 MED ORDER — LISINOPRIL 20 MG PO TABS
20.0000 mg | ORAL_TABLET | Freq: Every day | ORAL | Status: DC
  Administered 2022-06-03 – 2022-06-04 (×2): 20 mg via ORAL
  Filled 2022-06-02 (×2): qty 1

## 2022-06-02 MED ORDER — DEXAMETHASONE SODIUM PHOSPHATE 10 MG/ML IJ SOLN
INTRAMUSCULAR | Status: AC
  Filled 2022-06-02: qty 1

## 2022-06-02 MED ORDER — DEXTROSE 50 % IV SOLN
INTRAVENOUS | Status: AC
  Filled 2022-06-02: qty 50

## 2022-06-02 MED ORDER — GABAPENTIN 300 MG PO CAPS
600.0000 mg | ORAL_CAPSULE | Freq: Two times a day (BID) | ORAL | Status: DC
  Administered 2022-06-02 – 2022-06-04 (×4): 600 mg via ORAL
  Filled 2022-06-02 (×4): qty 2

## 2022-06-02 MED ORDER — PROPOFOL 10 MG/ML IV BOLUS
INTRAVENOUS | Status: DC | PRN
  Administered 2022-06-02: 60 mg via INTRAVENOUS

## 2022-06-02 MED ORDER — DEXTROSE-NACL 5-0.45 % IV SOLN
500.0000 mL | INTRAVENOUS | Status: DC
  Administered 2022-06-02: 500 mL via INTRAVENOUS

## 2022-06-02 MED ORDER — LIDOCAINE 2% (20 MG/ML) 5 ML SYRINGE
INTRAMUSCULAR | Status: DC | PRN
  Administered 2022-06-02: 40 mg via INTRAVENOUS

## 2022-06-02 MED ORDER — VANCOMYCIN HCL IN DEXTROSE 1-5 GM/200ML-% IV SOLN
1000.0000 mg | Freq: Once | INTRAVENOUS | Status: AC
  Administered 2022-06-02: 1000 mg via INTRAVENOUS
  Filled 2022-06-02: qty 200

## 2022-06-02 MED ORDER — PHENYLEPHRINE 80 MCG/ML (10ML) SYRINGE FOR IV PUSH (FOR BLOOD PRESSURE SUPPORT)
PREFILLED_SYRINGE | INTRAVENOUS | Status: AC
  Filled 2022-06-02: qty 10

## 2022-06-02 MED ORDER — DIPHENHYDRAMINE HCL 50 MG/ML IJ SOLN
INTRAMUSCULAR | Status: DC | PRN
  Administered 2022-06-02: 12.5 mg via INTRAVENOUS

## 2022-06-02 MED ORDER — VANCOMYCIN HCL 1000 MG IV SOLR
INTRAVENOUS | Status: AC
  Filled 2022-06-02: qty 20

## 2022-06-02 MED ORDER — VANCOMYCIN HCL IN DEXTROSE 1-5 GM/200ML-% IV SOLN
1000.0000 mg | INTRAVENOUS | Status: AC
  Administered 2022-06-02: 1000 mg via INTRAVENOUS
  Filled 2022-06-02: qty 200

## 2022-06-02 MED ORDER — MORPHINE SULFATE (PF) 4 MG/ML IV SOLN: 4.0000 mg | INTRAVENOUS | Status: DC | PRN

## 2022-06-02 MED ORDER — CEFAZOLIN SODIUM-DEXTROSE 2-4 GM/100ML-% IV SOLN: 2.0000 g | INTRAVENOUS | Status: DC

## 2022-06-02 MED ORDER — OXYCODONE HCL 5 MG PO TABS: 5.0000 mg | ORAL_TABLET | Freq: Once | ORAL | Status: DC | PRN

## 2022-06-02 MED ORDER — FENTANYL CITRATE (PF) 100 MCG/2ML IJ SOLN
INTRAMUSCULAR | Status: AC
  Filled 2022-06-02: qty 2

## 2022-06-02 MED ORDER — GADOBUTROL 1 MMOL/ML IV SOLN
10.0000 mL | Freq: Once | INTRAVENOUS | Status: AC | PRN
  Administered 2022-06-02: 10 mL via INTRAVENOUS

## 2022-06-02 MED ORDER — ACETAMINOPHEN 500 MG PO TABS: 1000.0000 mg | ORAL_TABLET | Freq: Once | ORAL | Status: DC

## 2022-06-02 MED ORDER — DIPHENHYDRAMINE HCL 50 MG/ML IJ SOLN
INTRAMUSCULAR | Status: AC
  Filled 2022-06-02: qty 1

## 2022-06-02 MED ORDER — PROPOFOL 500 MG/50ML IV EMUL
INTRAVENOUS | Status: DC | PRN
  Administered 2022-06-02: 100 ug/kg/min via INTRAVENOUS

## 2022-06-02 MED ORDER — PHENYLEPHRINE 80 MCG/ML (10ML) SYRINGE FOR IV PUSH (FOR BLOOD PRESSURE SUPPORT)
PREFILLED_SYRINGE | INTRAVENOUS | Status: DC | PRN
  Administered 2022-06-02 (×2): 160 ug via INTRAVENOUS
  Administered 2022-06-02: 80 ug via INTRAVENOUS
  Administered 2022-06-02 (×2): 40 ug via INTRAVENOUS
  Administered 2022-06-02: 80 ug via INTRAVENOUS

## 2022-06-02 MED ORDER — SODIUM CHLORIDE 0.9 % IV SOLN
2.0000 g | Freq: Three times a day (TID) | INTRAVENOUS | Status: DC
  Administered 2022-06-02 – 2022-06-04 (×6): 2 g via INTRAVENOUS
  Filled 2022-06-02 (×7): qty 12.5

## 2022-06-02 MED ORDER — FENTANYL CITRATE (PF) 100 MCG/2ML IJ SOLN
INTRAMUSCULAR | Status: DC | PRN
  Administered 2022-06-02 (×2): 50 ug via INTRAVENOUS

## 2022-06-02 MED ORDER — SODIUM CHLORIDE 0.9 % IV SOLN
2.0000 g | Freq: Once | INTRAVENOUS | Status: AC
  Administered 2022-06-02: 2 g via INTRAVENOUS
  Filled 2022-06-02: qty 12.5

## 2022-06-02 MED ORDER — PROPOFOL 10 MG/ML IV BOLUS
INTRAVENOUS | Status: AC
  Filled 2022-06-02: qty 20

## 2022-06-02 MED ORDER — SERTRALINE HCL 100 MG PO TABS
100.0000 mg | ORAL_TABLET | Freq: Every day | ORAL | Status: DC
  Administered 2022-06-03 – 2022-06-04 (×2): 100 mg via ORAL
  Filled 2022-06-02 (×2): qty 1

## 2022-06-02 MED ORDER — INSULIN ASPART 100 UNIT/ML IJ SOLN
0.0000 [IU] | Freq: Three times a day (TID) | INTRAMUSCULAR | Status: DC
  Administered 2022-06-03 – 2022-06-04 (×3): 3 [IU] via SUBCUTANEOUS

## 2022-06-02 MED ORDER — HYDROCHLOROTHIAZIDE 25 MG PO TABS
25.0000 mg | ORAL_TABLET | Freq: Every day | ORAL | Status: DC
  Administered 2022-06-03 – 2022-06-04 (×2): 25 mg via ORAL
  Filled 2022-06-02 (×2): qty 1

## 2022-06-02 MED ORDER — ONDANSETRON HCL 4 MG/2ML IJ SOLN: 4.0000 mg | Freq: Four times a day (QID) | INTRAMUSCULAR | Status: DC | PRN

## 2022-06-02 MED ORDER — ONDANSETRON HCL 4 MG/2ML IJ SOLN
INTRAMUSCULAR | Status: DC | PRN
  Administered 2022-06-02: 4 mg via INTRAVENOUS

## 2022-06-02 MED ORDER — MIDAZOLAM HCL 2 MG/2ML IJ SOLN
INTRAMUSCULAR | Status: AC
  Filled 2022-06-02: qty 2

## 2022-06-02 MED ORDER — ATORVASTATIN CALCIUM 40 MG PO TABS
80.0000 mg | ORAL_TABLET | Freq: Every day | ORAL | Status: DC
  Administered 2022-06-02 – 2022-06-03 (×2): 80 mg via ORAL
  Filled 2022-06-02 (×2): qty 2

## 2022-06-02 MED ORDER — LACTATED RINGERS IV SOLN: INTRAVENOUS | Status: DC | PRN

## 2022-06-02 MED ORDER — EMPAGLIFLOZIN 25 MG PO TABS
25.0000 mg | ORAL_TABLET | Freq: Every day | ORAL | Status: DC
  Administered 2022-06-03 – 2022-06-04 (×2): 25 mg via ORAL
  Filled 2022-06-02 (×2): qty 1

## 2022-06-02 MED ORDER — ACETAMINOPHEN 500 MG PO TABS
ORAL_TABLET | ORAL | Status: AC
  Administered 2022-06-02: 1000 mg
  Filled 2022-06-02: qty 2

## 2022-06-02 MED ORDER — VANCOMYCIN HCL 1000 MG IV SOLR
INTRAVENOUS | Status: DC | PRN
  Administered 2022-06-02: 1000 mg

## 2022-06-02 MED ORDER — VANCOMYCIN HCL 1500 MG/300ML IV SOLN
1500.0000 mg | INTRAVENOUS | Status: DC
  Administered 2022-06-03: 1500 mg via INTRAVENOUS
  Filled 2022-06-02 (×2): qty 300

## 2022-06-02 MED ORDER — MIDAZOLAM HCL 5 MG/5ML IJ SOLN
INTRAMUSCULAR | Status: DC | PRN
  Administered 2022-06-02: 2 mg via INTRAVENOUS

## 2022-06-02 SURGICAL SUPPLY — 46 items
ADH SKN CLS APL DERMABOND .7 (GAUZE/BANDAGES/DRESSINGS) ×2
APL PRP STRL LF DISP 70% ISPRP (MISCELLANEOUS) ×4
BAG COUNTER SPONGE SURGICOUNT (BAG) ×3 IMPLANT
BAG SPEC THK2 15X12 ZIP CLS (MISCELLANEOUS) ×2
BAG SPNG CNTER NS LX DISP (BAG) ×2
BAG ZIPLOCK 12X15 (MISCELLANEOUS) ×3 IMPLANT
BLADE SAW SGTL 11.0X1.19X90.0M (BLADE) IMPLANT
BLADE SURG 15 STRL LF DISP TIS (BLADE) ×6 IMPLANT
BLADE SURG 15 STRL SS (BLADE) ×4
BNDG ELASTIC 4X5.8 VLCR STR LF (GAUZE/BANDAGES/DRESSINGS) ×1 IMPLANT
BNDG ELASTIC 6X5.8 VLCR STR LF (GAUZE/BANDAGES/DRESSINGS) ×3 IMPLANT
CHLORAPREP W/TINT 26 (MISCELLANEOUS) ×6 IMPLANT
COVER SURGICAL LIGHT HANDLE (MISCELLANEOUS) ×3 IMPLANT
CUFF TOURN SGL QUICK 34 (TOURNIQUET CUFF) ×2
CUFF TRNQT CYL 34X4.125X (TOURNIQUET CUFF) ×3 IMPLANT
DERMABOND ADVANCED .7 DNX12 (GAUZE/BANDAGES/DRESSINGS) ×3 IMPLANT
DRAPE INCISE IOBAN 66X45 STRL (DRAPES) ×9 IMPLANT
DRAPE SHEET LG 3/4 BI-LAMINATE (DRAPES) ×3 IMPLANT
DRSG ADAPTIC 3X8 NADH LF (GAUZE/BANDAGES/DRESSINGS) ×1 IMPLANT
DRSG AQUACEL AG ADV 3.5X10 (GAUZE/BANDAGES/DRESSINGS) ×3 IMPLANT
DRSG EMULSION OIL 3X16 NADH (GAUZE/BANDAGES/DRESSINGS) ×1 IMPLANT
DRSG TEGADERM 4X4.75 (GAUZE/BANDAGES/DRESSINGS) IMPLANT
EVACUATOR 1/8 PVC DRAIN (DRAIN) IMPLANT
GAUZE PAD ABD 8X10 STRL (GAUZE/BANDAGES/DRESSINGS) IMPLANT
GAUZE SPONGE 2X2 8PLY STRL LF (GAUZE/BANDAGES/DRESSINGS) IMPLANT
GAUZE SPONGE 4X4 12PLY STRL (GAUZE/BANDAGES/DRESSINGS) ×1 IMPLANT
GLOVE BIO SURGEON STRL SZ7.5 (GLOVE) ×3 IMPLANT
GLOVE BIOGEL PI IND STRL 7.5 (GLOVE) ×3 IMPLANT
GOWN STRL REUS W/ TWL LRG LVL3 (GOWN DISPOSABLE) ×3 IMPLANT
GOWN STRL REUS W/TWL LRG LVL3 (GOWN DISPOSABLE) ×2
HANDPIECE INTERPULSE COAX TIP (DISPOSABLE)
MANIFOLD NEPTUNE II (INSTRUMENTS) ×3 IMPLANT
PACK TOTAL KNEE CUSTOM (KITS) ×3 IMPLANT
PADDING CAST COTTON 6X4 STRL (CAST SUPPLIES) ×3 IMPLANT
PROTECTOR NERVE ULNAR (MISCELLANEOUS) ×3 IMPLANT
SET HNDPC FAN SPRY TIP SCT (DISPOSABLE) ×2 IMPLANT
SET PAD KNEE POSITIONER (MISCELLANEOUS) ×3 IMPLANT
SOLUTION IRRIG SURGIPHOR (IV SOLUTION) ×3 IMPLANT
STAPLER VISISTAT 35W (STAPLE) IMPLANT
SUT MNCRL AB 4-0 PS2 18 (SUTURE) ×2 IMPLANT
SUT STRATAFIX PDS+ 0 24IN (SUTURE) ×2 IMPLANT
SUT VIC AB 1 CT1 36 (SUTURE) ×6 IMPLANT
SUT VIC AB 2-0 CT1 27 (SUTURE) ×6
SUT VIC AB 2-0 CT1 TAPERPNT 27 (SUTURE) ×9 IMPLANT
SWAB COLLECTION DEVICE MRSA (MISCELLANEOUS) IMPLANT
SWAB CULTURE ESWAB REG 1ML (MISCELLANEOUS) IMPLANT

## 2022-06-02 NOTE — Progress Notes (Signed)
A consult was received from an ED physician for vanc/cefepime per pharmacy dosing.  The patient's profile has been reviewed for ht/wt/allergies/indication/available labs.   A one time order has been placed for vanc 1g and cefepime 2g.  Further antibiotics/pharmacy consults should be ordered by admitting physician if indicated.                       Thank you, Kara Mead 06/02/2022  10:06 AM

## 2022-06-02 NOTE — Op Note (Signed)
06/02/2022  11:06 PM   PATIENT: Donna [name]  56 y.o. female  MRN: [id number]   PRE-OPERATIVE DIAGNOSIS:   Right lateral foot gangrene with fifth toe osteomyelitis   POST-OPERATIVE DIAGNOSIS:   Same   PROCEDURE: Right fifth ray amputation   SURGEON:  Netta Cedars, MD   ASSISTANT: None   ANESTHESIA: General, regional   EBL: Minimal   TOURNIQUET:    Total Tourniquet Time Documented: Thigh (Right) - 36 minutes Total: Thigh (Right) - 36 minutes    COMPLICATIONS: None apparent   DISPOSITION: Extubated, awake and stable to recovery.   INDICATION FOR PROCEDURE: The patient presented with right foot gangrene x 2 days. She denies trauma but notes that she has had multiple calluses in this foot. She was being treated by wound care for these. She noticed new redness and swelling in the right lateral foot prompting her to visit the ER. Advanced imaging demonstrated right fifth toe distal phalanx osteomyelitis and clinical evaluation revealed overlying cellulitis.  We discussed the diagnosis, alternative treatment options, risks and benefits of the above surgical intervention, as well as alternative non-operative treatments. All questions/concerns were addressed and the patient/family demonstrated appropriate understanding of the diagnosis, the procedure, the postoperative course, and overall prognosis. The patient wished to proceed with surgical intervention and signed an informed surgical consent as such, in each others presence prior to surgery.   PROCEDURE IN DETAIL: After preoperative consent was obtained and the correct operative site was identified, the patient was brought to the operating room supine on stretcher and transferred onto operating table. General anesthesia was induced. Preoperative antibiotics were administered. Surgical timeout was taken. The patient was then positioned supine with an ipsilateral hip bump. The operative lower extremity was  prepped and draped in standard sterile fashion with a tourniquet around the thigh. The extremity was exsanguinated and the tourniquet was inflated to 275 mmHg.  A longitudinal incision was made over the lateral aspect of the right fifth ray. This was performed to resect all areas of soft tissue necrosis. Due to extensive soft tissue involvement, it was decided to proceed with right fifth ray amputation as planned. Some frank purulence was encountered. Deep swabs were sent for aerobe and anaerobe cultures. The right fifth toe was resected at the metatarsophalangeal joint. We then used a sagittal saw to excise distal portion of the fifth metatarsal shaft as verified by intraoperative fluoroscopy. Rongeurs were used to smooth the distal aspect of the residual fifth metatarsal to avoid any bony prominence. The remainder of the foot and ankle were noted to be immediately improved in appearance following resection of the infected fifth ray.   The surgical sites were thoroughly irrigated. The tourniquet was deflated and hemostasis achieved. Povidone iodine was instilled as well as vancomycin powder. The skin was gently approximated without tension using 2-0 prolene suture.    The leg was cleaned with saline and sterile adaptic dressings with gauze were applied. Postoperative shoe was applied. The patient was awakened from anesthesia and transported to the recovery room in stable condition.    FOLLOW UP PLAN: -transfer to PACU, then return to RNF -strict heel weightbearing in postop shoe operative extremity, maximum elevation -close clinical monitoring of the right lower extremity -DVT ppx: Aspirin 81 mg twice daily until fully weightbearing or per primary team preference -trend intraoperative cultures -antibiotics per primary team -follow up as outpatient in 7 days for wound check -sutures out in 2-3 weeks in outpatient office   RADIOGRAPHS: AP, lateral, oblique  of the right foot were obtained  intraoperatively. These showed interval right fifth ray amputation. No other acute injuries are noted.   Armond Hang Orthopaedic Surgery [facility]

## 2022-06-02 NOTE — H&P (Signed)
History and Physical    Patient: Donna [name] DJT:[id number] DOB: Nov 13, 1965 DOA: 06/02/2022 DOS: the patient was seen and examined on 06/02/2022 PCP: Myrlene Broker, MD  Patient coming from: Home  Chief Complaint:  Chief Complaint  Patient presents with   Wound Check   HPI: Donna [name] is a 56 y.o. female with medical history significant of DM2, peripheral neuropathy, HLD, HTN, anxiety. Presenting with right foot wound. She reports that she had worsening swelling and erythema of her 5th toe of her right foot over the past couple of days. She felt generally poor a couple of days ago and feverish. She took off her shoe and saw that toe was blistered and drainage. She had a fever of 102. She spoke with her wound care team. They recommended that she see urgent care. She was given abx, but her symptoms have not improved. She decided to come to the ED for evaluation. She denies any other aggravating or alleviating factors.   Review of Systems: As mentioned in the history of present illness. All other systems reviewed and are negative. Past Medical History:  Diagnosis Date   Diabetes mellitus    type 2- uncontrolled   Hyperlipidemia    Hypertension    Lumbar disc disease    Obesity    Past Surgical History:  Procedure Laterality Date   CHOLECYSTECTOMY  '96   Lap   ESI     lumbar spine, spinal injections   EXCISION HAGLUND'S DEFORMITY WITH ACHILLES TENDON REPAIR Right 07/23/2019   Procedure: Right Achilles Tendon Reconstruction, Excision of Haglund Deformity;  Surgeon: Toni Arthurs, MD;  Location: [facility];  Service: Orthopedics;  Laterality: Right;   GASTROC RECESSION EXTREMITY Right 07/23/2019   Procedure: Gastroc Recession;  Surgeon: Toni Arthurs, MD;  Location: [facility];  Service: Orthopedics;  Laterality: Right;   REDUCTION MAMMAPLASTY Bilateral    reduction mammoplasty bilaterally     Social History:  reports that she has never  smoked. She has never used smokeless tobacco. She reports that she does not drink alcohol and does not use drugs.  No Known Allergies  Family History  Problem Relation Age of Onset   Other Father        CHF   Coronary artery disease Father    Cancer Father        renal cell carcinoma   Hypertension Father    Diabetes Father    Diabetes Mother    Coronary artery disease Mother    Heart attack Mother    Cancer Paternal Aunt        Breast   Breast cancer Paternal Aunt 66   Atrial fibrillation Other    Peripheral vascular disease Other    Heart disease Other     Prior to Admission medications   Medication Sig Start Date End Date Taking? Authorizing Provider  amoxicillin-clavulanate (AUGMENTIN) 875-125 MG tablet Take 1 tablet by mouth every 12 hours. 06/01/22   Raspet, Noberto Retort, PA-C  atorvastatin (LIPITOR) 80 MG tablet TAKE 1 TABLET BY MOUTH ONCE A DAY 04/28/21 06/13/22  Myrlene Broker, MD  doxycycline (VIBRAMYCIN) 100 MG capsule Take 1 capsule by mouth 2 times daily for 14 days 06/01/22   Raspet, Noberto Retort, PA-C  FreeStyle Unistick II Lancets MISC Use to check blood sugars twice a day 03/16/21   Myrlene Broker, MD  gabapentin (NEURONTIN) 300 MG capsule TAKE 1 CAPSULE BY MOUTH FOUR TIMES DAILY 11/13/21 11/13/22  Hillard Danker  A, MD  glucose blood (FREESTYLE LITE) test strip Use as directed to test blood sugar twice daily 12/25/21 12/25/22  Myrlene Broker, MD  insulin degludec (TRESIBA FLEXTOUCH) 200 UNIT/ML FlexTouch Pen Inject 110 Units into the skin daily. 05/23/22   Myrlene Broker, MD  Insulin Pen Needle 31G X 5 MM MISC Use as directed once daily with tresiba 05/02/21   Myrlene Broker, MD  JARDIANCE 25 MG TABS tablet Take 1 tablet (25 mg total) by mouth daily (needs office visit) 12/13/21   Myrlene Broker, MD  lisinopril-hydrochlorothiazide (ZESTORETIC) 20-25 MG tablet TAKE 1 TABLET BY MOUTH ONCE A DAY 02/15/22 02/15/23  Myrlene Broker, MD   metFORMIN (GLUCOPHAGE) 1000 MG tablet TAKE 1 TABLET BY MOUTH TWICE DAILY 04/19/22 04/19/23  Myrlene Broker, MD  NEOMYCIN-POLYMYXIN-HYDROCORTISONE (CORTISPORIN) 1 % SOLN OTIC solution Place 3 drops into both ears in the morning, at noon, and at bedtime. 02/27/21   Myrlene Broker, MD  sertraline (ZOLOFT) 100 MG tablet TAKE 1 TABLET BY MOUTH ONCE DAILY 05/24/22 05/24/23  Myrlene Broker, MD  triamcinolone (KENALOG) 0.1 % paste Use as directed 1 application in the mouth or throat 2 (two) times daily. 02/27/21   Myrlene Broker, MD  Insulin [facility]) 100 UNIT/ML Inject 0.5 mLs (50 Units total) into the skin 2 (two) times daily. 11/30/19 10/25/20  Myrlene Broker, MD    Physical Exam: Vitals:   06/02/22 0945 06/02/22 1154  BP: (!) 141/77 121/87  Pulse: 94 91  Resp: 18 16  Temp: 99.1 F (37.3 C)   TempSrc: Oral   SpO2: 98% 96%   General: 56 y.o. female resting in bed in NAD Eyes: PRRL, normal sclera ENMT: Nares patent w/o discharge, orophaynx clear, dentition normal, ears w/o discharge/lesions/ulcers Neck: Supple, trachea midline Cardiovascular: RRR, +S1, S2, no m/g/r, equal pulses throughout Respiratory: CTABL, no w/r/r, normal WOB GI: BS+, NDNT, no masses noted, no organomegaly noted MSK: No c/c; erythema of right foot foot, scaling of right great toe, erythema and drainage of right 5th digit Neuro: A&O x 3, no focal deficits Psyc: Appropriate interaction and affect, calm/cooperative  Data Reviewed:  Results for orders placed or performed during the hospital encounter of 06/02/22 (from the past 24 hour(s))  CBC with Differential     Status: Abnormal   Collection Time: 06/02/22 10:33 AM  Result Value Ref Range   WBC 13.0 (H) 4.0 - 10.5 K/uL   RBC 4.77 3.87 - 5.11 MIL/uL   Hemoglobin 14.4 12.0 - 15.0 g/dL   HCT 75.1 02.5 - 85.2 %   MCV 93.1 80.0 - 100.0 fL   MCH 30.2 26.0 - 34.0 pg   MCHC 32.4 30.0 - 36.0 g/dL   RDW 77.8 24.2 - 35.3 %    Platelets 284 150 - 400 K/uL   nRBC 0.0 0.0 - 0.2 %   Neutrophils Relative % 81 %   Neutro Abs 10.3 (H) 1.7 - 7.7 K/uL   Lymphocytes Relative 11 %   Lymphs Abs 1.5 0.7 - 4.0 K/uL   Monocytes Relative 7 %   Monocytes Absolute 0.9 0.1 - 1.0 K/uL   Eosinophils Relative 1 %   Eosinophils Absolute 0.1 0.0 - 0.5 K/uL   Basophils Relative 0 %   Basophils Absolute 0.1 0.0 - 0.1 K/uL   Immature Granulocytes 0 %   Abs Immature Granulocytes 0.04 0.00 - 0.07 K/uL  Basic metabolic panel     Status: Abnormal   Collection  Time: 06/02/22 10:33 AM  Result Value Ref Range   Sodium 135 135 - 145 mmol/L   Potassium 3.6 3.5 - 5.1 mmol/L   Chloride 100 98 - 111 mmol/L   CO2 27 22 - 32 mmol/L   Glucose, Bld 114 (H) 70 - 99 mg/dL   BUN 15 6 - 20 mg/dL   Creatinine, Ser 0.60 0.44 - 1.00 mg/dL   Calcium 8.9 8.9 - 10.3 mg/dL   GFR, Estimated >60 >60 mL/min   Anion gap 8 5 - 15  Lactic acid, plasma     Status: None   Collection Time: 06/02/22 10:35 AM  Result Value Ref Range   Lactic Acid, Venous 1.2 0.5 - 1.9 mmol/L   XR Right Foot: Mild diffuse distal soft tissue swelling without underlying bony abnormality.  MR Right Foot 1. Bone marrow edema within the distal phalanx of the fifth toe and more subtly within the proximal phalanx of the fifth toe. Findings are suspicious for early acute osteomyelitis. 2. Cutaneous blister at the dorsal aspect of the distal forefoot near the fifth toe. Subcutaneous edema and enhancement within the soft tissues of the forefoot, which may reflect cellulitis. No abscess  Assessment and Plan: Osteomyelitis of 5th digit of right foot     - admit to inpt, med-surg     - NPO for now     - [facility] to see; possibly to OR today     - pain control     - send cultures from surgery     - started on vanc/cefepime in ED; can continue  DM2 Diabetic neuropathy     - SSI, glucose checks     - NPO for now; may have DM diet after procedure     - A1c 8.0 on  04/24/22  HTN     - continue home regimen when off NPO status  HLD     - continue home regimen when off NPO status  Depression     - continue home regimen when off NPO status  Advance Care Planning:   Code Status: FULL  Consults: EDP consulted [facility]  Family Communication: None at bedside  Severity of Illness: The appropriate patient status for this patient is INPATIENT. Inpatient status is judged to be reasonable and necessary in order to provide the required intensity of service to ensure the patient's safety. The patient's presenting symptoms, physical exam findings, and initial radiographic and laboratory data in the context of their chronic comorbidities is felt to place them at high risk for further clinical deterioration. Furthermore, it is not anticipated that the patient will be medically stable for discharge from the hospital within 2 midnights of admission.   * I certify that at the point of admission it is my clinical judgment that the patient will require inpatient hospital care spanning beyond 2 midnights from the point of admission due to high intensity of service, high risk for further deterioration and high frequency of surveillance required.*  Author: Jonnie Finner, DO 06/02/2022 12:50 PM  For on call review [email or web address].

## 2022-06-02 NOTE — Anesthesia Preprocedure Evaluation (Addendum)
Anesthesia Evaluation  Patient identified by MRN, date of birth, ID band Patient awake    Reviewed: Allergy & Precautions, NPO status , Patient's Chart, lab work & pertinent test results  History of Anesthesia Complications Negative for: history of anesthetic complications  Airway Mallampati: III  TM Distance: >3 FB Neck ROM: Full    Dental  (+) Dental Advisory Given, Teeth Intact   Pulmonary neg pulmonary ROS,    Pulmonary exam normal        Cardiovascular hypertension, Pt. on medications Normal cardiovascular exam     Neuro/Psych PSYCHIATRIC DISORDERS Depression  RLS     GI/Hepatic Neg liver ROS, GERD  Controlled,  Endo/Other  diabetes, Poorly Controlled, Type 2, Insulin Dependent, Oral Hypoglycemic AgentsMorbid obesity  Renal/GU negative Renal ROS     Musculoskeletal negative musculoskeletal ROS (+)   Abdominal   Peds  Hematology negative hematology ROS (+)   Anesthesia Other Findings   Reproductive/Obstetrics                           Anesthesia Physical Anesthesia Plan  ASA: 3  Anesthesia Plan: MAC   Post-op Pain Management: Tylenol PO (pre-op)* and Regional block*   Induction: Intravenous  PONV Risk Score and Plan: 2 and Treatment may vary due to age or medical condition  Airway Management Planned: Natural Airway and Simple Face Mask  Additional Equipment: None  Intra-op Plan:   Post-operative Plan:   Informed Consent: I have reviewed the patients History and Physical, chart, labs and discussed the procedure including the risks, benefits and alternatives for the proposed anesthesia with the patient or authorized representative who has indicated his/her understanding and acceptance.       Plan Discussed with: CRNA, Anesthesiologist and Surgeon  Anesthesia Plan Comments:       Anesthesia Quick Evaluation

## 2022-06-02 NOTE — H&P (Signed)
H&P Update:  -History and Physical Reviewed  -Patient has been re-examined  -No change in the plan of care  -The risks and benefits were presented and reviewed. The risks due to new/persistent/progressive infection, stiffness, nerve/vessel/tendon injury, nonunion/malunion, wound healing issues, development of arthritis, failure of this surgery, possibility of delayed definitive surgery, need for further surgery including further amputation, thromboembolic events, anesthesia/medical complications, amputation, death among others were discussed. The patient acknowledged the explanation, agreed to proceed with the plan and a consent was signed.  Donna [name]]

## 2022-06-02 NOTE — Transfer of Care (Signed)
Immediate Anesthesia Transfer of Care Note  Patient: Donna [name]  Procedure(s) Performed: RIGHT FIFTH RAY AMPUTATION (Right: Foot)  Patient Location: PACU  Anesthesia Type:MAC  Level of Consciousness: drowsy and patient cooperative  Airway & Oxygen Therapy: Patient Spontanous Breathing and Patient connected to face mask oxygen  Post-op Assessment: Report given to RN and Post -op Vital signs reviewed and stable  Post vital signs: Reviewed and stable  Last Vitals:  Vitals Value Taken Time  BP 137/79 06/02/22 2008  Temp 36.2 C 06/02/22 2009  Pulse 92 06/02/22 2012  Resp 14 06/02/22 2012  SpO2 96 % 06/02/22 2012  Vitals shown include unvalidated device data.  Last Pain:  Vitals:   06/02/22 1605  TempSrc:   PainSc: 0-No pain         Complications: No notable events documented.

## 2022-06-02 NOTE — Anesthesia Postprocedure Evaluation (Signed)
Anesthesia Post Note  Patient: Donna [name]  Procedure(s) Performed: RIGHT FIFTH RAY AMPUTATION (Right: Foot)     Patient location during evaluation: PACU Anesthesia Type: MAC Level of consciousness: awake and alert Pain management: pain level controlled Vital Signs Assessment: post-procedure vital signs reviewed and stable Respiratory status: spontaneous breathing, nonlabored ventilation, respiratory function stable and patient connected to nasal cannula oxygen Cardiovascular status: stable and blood pressure returned to baseline Anesthetic complications: no   No notable events documented.  Last Vitals:  Vitals:   06/02/22 2015 06/02/22 2025  BP: 117/80   Pulse: 94 90  Resp: 16 14  Temp:    SpO2: 96% 99%    Last Pain:  Vitals:   06/02/22 2025  TempSrc:   PainSc: 0-No pain                 Audry Pili

## 2022-06-02 NOTE — Consult Note (Signed)
Donna [name] is an 56 y.o. female.    Chief Complaint: left foot swelling and erythema  HPI: 55 y/o female with diabetes c/o worsening wound and erythema to right 5th toe. She states she noted increased erythema and fever over the past 2 days. Denies any recent falls or injuries. Last A1c 8.0. c/o neuropathy to bilateral feet. Otherwise doing fairly well. Pt tried oral and IM antibiotics with no improvement  PCP:  Myrlene Broker, MD  PMH: Past Medical History:  Diagnosis Date   Diabetes mellitus    type 2- uncontrolled   Hyperlipidemia    Hypertension    Lumbar disc disease    Obesity     PSes:  No Known Allergies  Medications: No current facility-administered medications for this encounter.   Current Outpatient Medications  Medication Sig Dispense Refill   amoxicillin-clavulanate (AUGMENTIN) 875-125 MG tablet Take 1 tablet by mouth every 12 hours. 20 tablet 0   atorvastatin (LIPITOR) 80 MG tablet TAKE 1 TABLET BY MOUTH ONCE A DAY 90 tablet 3   doxycycline (VIBRAMYCIN) 100 MG capsule Take 1 capsule by mouth 2 times daily for 14 days 20 capsule 0   FreeStyle Unistick II Lancets MISC Use to check blood sugars twice a day 100 each 0   gabapentin (NEURONTIN) 300 MG capsule TAKE 1 CAPSULE BY MOUTH FOUR TIMES DAILY 360 capsule 1   glucose blood (FREESTYLE LITE) test strip Use as directed to test blood sugar twice daily 100 strip 5   insulin degludec (TRESIBA FLEXTOUCH) 200 UNIT/ML FlexTouch Pen Inject 110 Units into the skin daily. 15 mL 2   Insulin Pen Needle 31G X 5 MM MISC Use as directed once daily with tresiba 100 each 3   JARDIANCE 25 MG TABS tablet Take 1 tablet (25 mg total) by mouth daily (needs office visit) 90 tablet 2   lisinopril-hydrochlorothiazide (ZESTORETIC) 20-25 MG tablet TAKE 1 TABLET BY MOUTH ONCE A DAY 90 tablet 1   metFORMIN (GLUCOPHAGE) 1000 MG tablet TAKE 1 TABLET BY MOUTH TWICE DAILY 180 tablet 3   NEOMYCIN-POLYMYXIN-HYDROCORTISONE  (CORTISPORIN) 1 % SOLN OTIC solution Place 3 drops into both ears in the morning, at noon, and at bedtime. 10 mL 0   sertraline (ZOLOFT) 100 MG tablet TAKE 1 TABLET BY MOUTH ONCE DAILY 90 tablet 3   triamcinolone (KENALOG) 0.1 % paste Use as directed 1 application in the mouth or throat 2 (two) times daily. 5 g 12    Results for orders placed or performed during the hospital encounter of 06/02/22 (from the past 48 hour(s))  CBC with Differential     Status: Abnormal   Collection Time: 06/02/22 10:33 AM  Result Value Ref Range   WBC 13.0 (H) 4.0 - 10.5 K/uL   RBC 4.77 3.87 - 5.11 MIL/uL   Hemoglobin 14.4 12.0 - 15.0 g/dL   HCT 91.7 91.5 - 05.6 %   MCV 93.1 80.0 - 100.0 fL   MCH 30.2 26.0 - 34.0 pg   MCHC 32.4 30.0 - 36.0 g/dL   RDW 97.9 48.0 - 16.5 %   Platelets 284 150 - 400 K/uL   nRBC 0.0 0.0 - 0.2 %   Neutrophils Relative % 81 %   Neutro Abs 10.3 (H) 1.7 - 7.7 K/uL   Lymphocytes Relative 11 %   Lymphs Abs 1.5 0.7 - 4.0 K/uL   Monocytes Relative 7 %   Monocytes Absolute 0.9 0.1 - 1.0 K/uL   Eosinophils Relative 1 %  Eosinophils Absolute 0.1 0.0 - 0.5 K/uL   Basophils Relative 0 %   Basophils Absolute 0.1 0.0 - 0.1 K/uL   Immature Granulocytes 0 %   Abs Immature Granulocytes 0.04 0.00 - 0.07 K/uL    Comment: Performed at [facility], [place] [address] [place], [place] [address]  Basic metabolic panel     Status: Abnormal   Collection Time: 06/02/22 10:33 AM  Result Value Ref Range   Sodium 135 135 - 145 mmol/L   Potassium 3.6 3.5 - 5.1 mmol/L   Chloride 100 98 - 111 mmol/L   CO2 27 22 - 32 mmol/L   Glucose, Bld 114 (H) 70 - 99 mg/dL    Comment: Glucose reference range applies only to samples taken after fasting for at least 8 hours.   BUN 15 6 - 20 mg/dL   Creatinine, Ser 0.60 0.44 - 1.00 mg/dL   Calcium 8.9 8.9 - 10.3 mg/dL   GFR, Estimated >60 >60 mL/min    Comment: (NOTE) Calculated using the CKD-EPI Creatinine Equation (2021)    Anion gap 8 5 -  15    Comment: Performed at [facility], [place] [address], [place], [place] [address]  Lactic acid, plasma     Status: None   Collection Time: 06/02/22 10:35 AM  Result Value Ref Range   Lactic Acid, Venous 1.2 0.5 - 1.9 mmol/L    Comment: Performed at [facility], [place] [address] [place],  [address]   MR FOOT RIGHT W WO CONTRAST  Result Date: 06/02/2022 CLINICAL DATA:  Foot swelling, diabetic, osteomyelitis suspected, xray done EXAM: MRI OF THE RIGHT FOREFOOT WITHOUT AND WITH CONTRAST TECHNIQUE: Multiplanar, multisequence MR imaging of the right forefoot was performed before and after the administration of intravenous contrast. CONTRAST:  27mL GADAVIST GADOBUTROL 1 MMOL/ML IV SOLN COMPARISON:  X-ray 06/01/2022 FINDINGS: Bones/Joint/Cartilage Bone marrow edema and mild enhancement within the distal phalanx of the fifth toe. Mild marrow edema within the proximal phalanx of the fifth toe. No erosion or confluent low T1 marrow signal changes are seen. No additional sites of focal bone marrow signal abnormality. No fracture or dislocation. Mild-to-moderate degenerative changes of the forefoot. No joint effusion. Ligaments Intact Lisfranc ligament.  Intact collateral ligaments. Muscles and Tendons Denervation changes of the foot musculature. Intact flexor and extensor tendons without tendinosis, tear, or tenosynovitis. Soft tissues Cutaneous blister at the dorsal aspect of the distal forefoot near the fifth toe. Subcutaneous edema and enhancement within the soft tissues of the forefoot. No organized or rim enhancing fluid collection. IMPRESSION: 1. Bone marrow edema within the distal phalanx of the fifth toe and more subtly within the proximal phalanx of the fifth toe. Findings are suspicious for early acute osteomyelitis. 2. Cutaneous blister at the dorsal aspect of the distal forefoot near the fifth toe. Subcutaneous edema and enhancement within the soft tissues  of the forefoot, which may reflect cellulitis. No abscess. Electronically Signed   By: Davina Poke D.O.   On: 06/02/2022 11:24   DG Foot Complete Right  Result Date: 06/01/2022 CLINICAL DATA:  Right 5th toe swelling and discoloration with clinical signs of infection. EXAM: RIGHT FOOT COMPLETE - 3+ VIEW COMPARISON:  Right great toe dated 12/14/2021. FINDINGS: Mild diffuse distal soft tissue swelling. Fracture, dislocation, bone destruction, periosteal reaction or soft tissue gas. IMPRESSION: Mild diffuse distal soft tissue swelling without underlying bony abnormality. Electronically Signed   By: Claudie Revering M.D.   On: 06/01/2022  15:35    ROS: Increased erythema and edema to right foot and 5th toe  Physical Exam: Right foot with mild edema and erythema Right 5th toe with large maceration along the MTP joint extending into the distal phalanx concerning for osteomyelitis Decreased sensation to all toes on the left foot.  Cap refill 2 seconds Callus to right plantar great toe but no erythema or edema Good plantar and dorsiflexion with no signs of ankle involvement  Assessment/Plan Assessment: osteomyelitis left 5th toe  Plan: Discussed case with Dr. Kathaleen Bury and after his exam recommends a ray amputation of the right 5th toe Risks and benefits of surgery discussed with patient All questions encouraged and answered Keep NPO Plan for surgery later today   Kathrynn Speed, [place] is now [facility]  [place] [address], [place], [place], [place] [address] Phone: [phone] [email or web address] Facebook  [employer]

## 2022-06-02 NOTE — Progress Notes (Signed)
CBG 74, she denied dizziness, shakiness, N/V. Provider updated, see new orders. Pt oncall to OR now.

## 2022-06-02 NOTE — ED Notes (Signed)
Patient transported to MRI 

## 2022-06-02 NOTE — Anesthesia Procedure Notes (Signed)
Anesthesia Regional Block: Ankle block   Pre-Anesthetic Checklist: , timeout performed,  Correct Patient, Correct Site, Correct Laterality,  Correct Procedure, Correct Position, site marked,  Risks and benefits discussed,  Surgical consent,  Pre-op evaluation,  At surgeon's request and post-op pain management  Laterality: Right  Prep: chloraprep       Needles:  Injection technique: Single-shot  Needle Type: Other     Needle Length: 4cm  Needle Gauge: 25     Additional Needles:   Narrative:  Start time: 06/02/2022 7:55 PM End time: 06/02/2022 7:57 PM Injection made incrementally with aspirations every 3 mL.  Performed by: Personally  Anesthesiologist: Audry Pili, MD  Additional Notes: Done while patient anesthetized with propofol infusion (MAC). No increased resistance to injection. Injection made in 3cc increments. Patient tolerated the procedure well.

## 2022-06-02 NOTE — ED Provider Notes (Signed)
[facility]-EMERGENCY DEPT Provider Note   CSN: [id number] Arrival date & time: 06/02/22  4196    History  Foot pain   Donna [name] is a 56 y.o. female for diabetes, hypertension, hyperlipidemia, diabetic neuropathy here for evaluation of wound to right little toe.  Began 2 days ago.  Felt like she had a callus to the lateral aspect of her right little toe.  Noted 2 days ago she had a fever of 102.4.  She felt poor.  Noted erythema, warmth and sloughing of her skin on her right little toe.  Progress significantly since then.  She is streaking going up her foot.  Previously was followed by wound care for a wound on her right great toe.  She is not followed by podiatry or orthopedics.  Denies prior history of osteomyelitis.  No nausea, vomiting, myalgias, numbness, significant weakness.  She was seen by urgent care yesterday.  Had an x-ray performed which showed some soft tissue swelling however no gas or obvious bony destruction to suggest osteomyelitis.  They recommended her to come to the emergency department at that time however patient declined.  She was given IM Rocephin, sent home on amoxicillin and doxycycline.  She returns today due to worsening erythema now streaking up her foot and distal tib/fib.  Noted some drainage to her right little toe.    HPI     Home Medications Prior to Admission medications   Medication Sig Start Date End Date Taking? Authorizing Provider  amoxicillin-clavulanate (AUGMENTIN) 875-125 MG tablet Take 1 tablet by mouth every 12 hours. 06/01/22   Raspet, Noberto Retort, PA-C  atorvastatin (LIPITOR) 80 MG tablet TAKE 1 TABLET BY MOUTH ONCE A DAY 04/28/21 06/13/22  Myrlene Broker, MD  doxycycline (VIBRAMYCIN) 100 MG capsule Take 1 capsule by mouth 2 times daily for 14 days 06/01/22   Raspet, Noberto Retort, PA-C  FreeStyle Unistick II Lancets MISC Use to check blood sugars twice a day 03/16/21   Myrlene Broker, MD  gabapentin (NEURONTIN) 300  MG capsule TAKE 1 CAPSULE BY MOUTH FOUR TIMES DAILY 11/13/21 11/13/22  Myrlene Broker, MD  glucose blood (FREESTYLE LITE) test strip Use as directed to test blood sugar twice daily 12/25/21 12/25/22  Myrlene Broker, MD  insulin degludec (TRESIBA FLEXTOUCH) 200 UNIT/ML FlexTouch Pen Inject 110 Units into the skin daily. 05/23/22   Myrlene Broker, MD  Insulin Pen Needle 31G X 5 MM MISC Use as directed once daily with tresiba 05/02/21   Myrlene Broker, MD  JARDIANCE 25 MG TABS tablet Take 1 tablet (25 mg total) by mouth daily (needs office visit) 12/13/21   Myrlene Broker, MD  lisinopril-hydrochlorothiazide (ZESTORETIC) 20-25 MG tablet TAKE 1 TABLET BY MOUTH ONCE A DAY 02/15/22 02/15/23  Myrlene Broker, MD  metFORMIN (GLUCOPHAGE) 1000 MG tablet TAKE 1 TABLET BY MOUTH TWICE DAILY 04/19/22 04/19/23  Myrlene Broker, MD  NEOMYCIN-POLYMYXIN-HYDROCORTISONE (CORTISPORIN) 1 % SOLN OTIC solution Place 3 drops into both ears in the morning, at noon, and at bedtime. 02/27/21   Myrlene Broker, MD  sertraline (ZOLOFT) 100 MG tablet TAKE 1 TABLET BY MOUTH ONCE DAILY 05/24/22 05/24/23  Myrlene Broker, MD  triamcinolone (KENALOG) 0.1 % paste Use as directed 1 application in the mouth or throat 2 (two) times daily. 02/27/21   Myrlene Broker, MD  Insulin Glargine [facility]) 100 UNIT/ML Inject 0.5 mLs (50 Units total) into the skin 2 (two) times daily. 11/30/19 10/25/20  Myrlene Broker, MD      Allergies    Patient has no known allergies.    Review of Systems   Review of Systems  Constitutional:  Positive for fever.  HENT: Negative.    Respiratory: Negative.    Cardiovascular: Negative.   Gastrointestinal: Negative.   Genitourinary: Negative.   Musculoskeletal: Negative.   Skin:  Positive for wound.  Neurological: Negative.   All other systems reviewed and are negative.   Physical Exam Updated Vital Signs BP 121/87   Pulse 91   Temp 99.1 F  (37.3 C) (Oral)   Resp 16   LMP 09/27/2011   SpO2 96%  Physical Exam Vitals and nursing note reviewed.  Constitutional:      General: She is not in acute distress.    Appearance: She is well-developed. She is not ill-appearing, toxic-appearing or diaphoretic.  HENT:     Head: Atraumatic.  Eyes:     Pupils: Pupils are equal, round, and reactive to light.  Cardiovascular:     Rate and Rhythm: Normal rate.     Pulses: Normal pulses.          Dorsalis pedis pulses are 2+ on the right side.       Posterior tibial pulses are 2+ on the right side.     Heart sounds: Normal heart sounds.  Pulmonary:     Effort: Pulmonary effort is normal. No respiratory distress.     Breath sounds: Normal breath sounds and air entry.     Comments: Clear bilaterally, speaks in full sentences without difficulty Abdominal:     General: There is no distension.  Musculoskeletal:        General: Normal range of motion.     Cervical back: Normal range of motion.     Comments: No significant bony tenderness.  Full range of motion without difficulty.  Skin:    General: Skin is warm and dry.     Capillary Refill: Capillary refill takes less than 2 seconds.     Findings: Erythema and wound present.     Comments: See picture in chart.  Patient last diffuse erythema to the dorsum of her right foot, streaking into the distal tib-fib region.  Mild erythema to plantar aspect around fifth metatarsal head. Sloughing skin to her right little toe concerning for wet gangrene.  Nail intact.  Healing ulceration right great toe at distal aspect without surrounding erythema.  No macerated skin between digits.  Neurological:     General: No focal deficit present.     Mental Status: She is alert.  Psychiatric:        Mood and Affect: Mood normal.        ED Results / Procedures / Treatments   Labs (all labs ordered are listed, but only abnormal results are displayed) Labs Reviewed  CBC WITH DIFFERENTIAL/PLATELET -  Abnormal; Notable for the following components:      Result Value   WBC 13.0 (*)    Neutro Abs 10.3 (*)    All other components within normal limits  BASIC METABOLIC PANEL - Abnormal; Notable for the following components:   Glucose, Bld 114 (*)    All other components within normal limits  CULTURE, BLOOD (ROUTINE X 2)  CULTURE, BLOOD (ROUTINE X 2)  LACTIC ACID, PLASMA  LACTIC ACID, PLASMA    EKG None  Radiology MR FOOT RIGHT W WO CONTRAST  Result Date: 06/02/2022 CLINICAL DATA:  Foot swelling, diabetic, osteomyelitis suspected, xray done EXAM:  MRI OF THE RIGHT FOREFOOT WITHOUT AND WITH CONTRAST TECHNIQUE: Multiplanar, multisequence MR imaging of the right forefoot was performed before and after the administration of intravenous contrast. CONTRAST:  8mL GADAVIST GADOBUTROL 1 MMOL/ML IV SOLN COMPARISON:  X-ray 06/01/2022 FINDINGS: Bones/Joint/Cartilage Bone marrow edema and mild enhancement within the distal phalanx of the fifth toe. Mild marrow edema within the proximal phalanx of the fifth toe. No erosion or confluent low T1 marrow signal changes are seen. No additional sites of focal bone marrow signal abnormality. No fracture or dislocation. Mild-to-moderate degenerative changes of the forefoot. No joint effusion. Ligaments Intact Lisfranc ligament.  Intact collateral ligaments. Muscles and Tendons Denervation changes of the foot musculature. Intact flexor and extensor tendons without tendinosis, tear, or tenosynovitis. Soft tissues Cutaneous blister at the dorsal aspect of the distal forefoot near the fifth toe. Subcutaneous edema and enhancement within the soft tissues of the forefoot. No organized or rim enhancing fluid collection. IMPRESSION: 1. Bone marrow edema within the distal phalanx of the fifth toe and more subtly within the proximal phalanx of the fifth toe. Findings are suspicious for early acute osteomyelitis. 2. Cutaneous blister at the dorsal aspect of the distal forefoot near  the fifth toe. Subcutaneous edema and enhancement within the soft tissues of the forefoot, which may reflect cellulitis. No abscess. Electronically Signed   By: Davina Poke D.O.   On: 06/02/2022 11:24   DG Foot Complete Right  Result Date: 06/01/2022 CLINICAL DATA:  Right 5th toe swelling and discoloration with clinical signs of infection. EXAM: RIGHT FOOT COMPLETE - 3+ VIEW COMPARISON:  Right great toe dated 12/14/2021. FINDINGS: Mild diffuse distal soft tissue swelling. Fracture, dislocation, bone destruction, periosteal reaction or soft tissue gas. IMPRESSION: Mild diffuse distal soft tissue swelling without underlying bony abnormality. Electronically Signed   By: Claudie Revering M.D.   On: 06/01/2022 15:35    Procedures Procedures    Medications Ordered in ED Medications  vancomycin (VANCOCIN) IVPB 1000 mg/200 mL premix (1,000 mg Intravenous New Bag/Given 06/02/22 1153)  ceFEPIme (MAXIPIME) 2 g in sodium chloride 0.9 % 100 mL IVPB (0 g Intravenous Stopped 06/02/22 1126)  gadobutrol (GADAVIST) 1 MMOL/ML injection 10 mL (10 mLs Intravenous Contrast Given 06/02/22 1106)    ED Course/ Medical Decision Making/ A&P    Pleasant 56 year old history of diabetes, diabetic neuropathy, hypertension, hyperlipidemia here for evaluation of wound to right little toe.  Had a fever approximately 2 days ago subsequently noted erythema and sloughing of her skin on her right little toe has progressed significantly since it began.  Did note that she had callus to the lateral aspect of her right little toe.  Was initially seen at urgent care who recommend she come to the emergency department however patient declined and recommended antibiotics.  They gave her IM Rocephin, started her on Augmentin and doxycycline.  Patient states she has had significant worsening since.  Now has streaking up the dorsum of her foot into her distal tib-fib.  Equal pulses bilaterally, neurovascularly intact.  She denies any known  trauma or injury.  No obvious blood prior to this.  She has not been followed by podiatry or orthopedics.  Of note she was followed previously by wound care for wound to her great toe which she has been subsequently discharged after improvement.  Reviewed her imaging from urgent care yesterday which did not show any bony abnormality to suggest osteomyelitis or gas-forming organism.  Unfortunately based off pictures from yesterday to today does look like  her infection has acutely worsened, has lymphangitis and significant cellulitis with concern for gangrene.  We will plan on further imaging with MRI, labs.  She will be started on Vanco and cefepime.  Will need admission for further management.  Suspect will need Ortho involvement given extent of wound.  Thankfully patient has stable vital signs she is currently afebrile without tachycardia.  We will add on blood cultures and lactic in addition to basic labs given extend of wound. She declines any need for pain meds at this time.  Labs and imaging personally viewed and interpreted:  CBC leukocytosis 13.0 with elevated neutrophils BMP glucose 114 Lactic 1.2 BC pending MR foot shows likely early osteomyelitis right fifth digit with surrounding cellulitis  Patient reassessed.  Discussed her labs and imaging.  She is agreeable for admission.  She is currently getting IV antibiotics.  Will discuss with hospitalist as well as orthopedics given her osteomyelitis on her imaging.  CONSULT with Dr. Odis Hollingshead with Ortho who rec admission, patient to be n.p.o., will try and get her in for surgery for amputation today.  Okay to stay at [facility] for now  I discussed plan with patient for OR with likely amputation.  She is agreeable for admission with plan  CONSULT with Dr. Ronaldo Miyamoto with [facility] who is agreeable to see patient in consult for admission  The patient appears reasonably stabilized for admission considering the current resources, flow, and capabilities  available in the ED at this time, and I doubt any other [facility] requiring further screening and/or treatment in the ED prior to admission.                            Medical Decision Making Amount and/or Complexity of Data Reviewed External Data Reviewed: labs, radiology and notes. Labs: ordered. Decision-making details documented in ED Course. Radiology: ordered and independent interpretation performed. Decision-making details documented in ED Course.  Risk OTC drugs. Prescription drug management. Parenteral controlled substances. Decision regarding hospitalization. Diagnosis or treatment significantly limited by social determinants of health.          Final Clinical Impression(s) / ED Diagnoses Final diagnoses:  Cellulitis of right lower extremity  Acute hematogenous osteomyelitis of right foot [facility])    Rx / DC Orders ED Discharge Orders     None         Britny Riel A, PA-C 06/02/22 1249    Glynn Octave, MD 06/02/22 1535

## 2022-06-02 NOTE — ED Triage Notes (Signed)
Pt c/o right foot ulcer worsening over last few days, Seen for same and given ABX. Prior wound care for ancillary great toe.

## 2022-06-02 NOTE — Brief Op Note (Signed)
06/02/2022  9:21 PM  PATIENT:  Donna [name]  56 y.o. female  PRE-OPERATIVE DIAGNOSIS:  Right foot gangrene  POST-OPERATIVE DIAGNOSIS:  Same  PROCEDURE:  Procedure(s): RIGHT FIFTH RAY AMPUTATION (Right)  SURGEON:  Surgeon(s) and Role:    * Armond Hang, MD - Primary  PHYSICIAN ASSISTANT: None  ASSISTANTS: none   ANESTHESIA:   regional and general  EBL:  10 mL   BLOOD ADMINISTERED:none  DRAINS: none   LOCAL MEDICATIONS USED:  NONE  SPECIMEN:  Source of Specimen:  right foot wound  DISPOSITION OF SPECIMEN:   Micro & Path  COUNTS:  YES  TOURNIQUET:   Total Tourniquet Time Documented: Thigh (Right) - 36 minutes Total: Thigh (Right) - 36 minutes   DICTATION: .Note written in EPIC  PLAN OF CARE:  Return to RNF  PATIENT DISPOSITION:  PACU - hemodynamically stable.   Delay start of Pharmacological VTE agent (>24hrs) due to surgical blood loss or risk of bleeding: no

## 2022-06-02 NOTE — Progress Notes (Signed)
Pharmacy Antibiotic Note  Donna [name] is a 56 y.o. female admitted on 06/02/2022 with  osteo .  Pharmacy has been consulted for Vanco, Cefepime dosing.  Active Problem(s): fever with left foot swelling and erythema, R 5th toe. Did not improve on OP abx.   PMH: DM, neuropathy, HTN, HLD, lumbar disc dz, obesity, anxiety, depression    ID: Right 5th toe with large maceration along the MTP joint extending into the distal phalanx concerning for osteomyelitis - Tmax 99.1. WBC 13. Scr <1  Vanco 10/14>> Cefepime 10/14>>   Plan: Plan 5th R ray amputation 10/14 Cefepime 2g IV q8hr Vanco 2g IV x 1 load Vancomycin 2000 mg IV Q 24 hrs. Goal AUC 400-550. Expected AUC: 459 SCr used: 0.8    Weight: 99.8 kg (220 lb)  Temp (24hrs), Avg:99.1 F (37.3 C), Min:99 F (37.2 C), Max:99.1 F (37.3 C)  Recent Labs  Lab 06/02/22 1033 06/02/22 1035  WBC 13.0*  --   CREATININE 0.60  --   LATICACIDVEN  --  1.2    Estimated Creatinine Clearance: 93.6 mL/min (by C-G formula based on SCr of 0.6 mg/dL).    No Known Allergies  Donna [name], PharmD, BCPS Clinical Staff Pharmacist [email or web address]  Thank you for allowing pharmacy to be a part of this patient's care.  Donna [name] 06/02/2022 2:20 PM

## 2022-06-03 ENCOUNTER — Encounter (HOSPITAL_COMMUNITY): Payer: Self-pay | Admitting: Orthopaedic Surgery

## 2022-06-03 DIAGNOSIS — L03115 Cellulitis of right lower limb: Secondary | ICD-10-CM | POA: Diagnosis not present

## 2022-06-03 DIAGNOSIS — I10 Essential (primary) hypertension: Secondary | ICD-10-CM | POA: Diagnosis not present

## 2022-06-03 DIAGNOSIS — M869 Osteomyelitis, unspecified: Secondary | ICD-10-CM | POA: Diagnosis not present

## 2022-06-03 DIAGNOSIS — E1169 Type 2 diabetes mellitus with other specified complication: Secondary | ICD-10-CM

## 2022-06-03 DIAGNOSIS — E785 Hyperlipidemia, unspecified: Secondary | ICD-10-CM

## 2022-06-03 DIAGNOSIS — F331 Major depressive disorder, recurrent, moderate: Secondary | ICD-10-CM | POA: Diagnosis not present

## 2022-06-03 LAB — COMPREHENSIVE METABOLIC PANEL
ALT: 39 U/L (ref 0–44)
AST: 18 U/L (ref 15–41)
Albumin: 3.4 g/dL — ABNORMAL LOW (ref 3.5–5.0)
Alkaline Phosphatase: 76 U/L (ref 38–126)
Anion gap: 8 (ref 5–15)
BUN: 15 mg/dL (ref 6–20)
CO2: 27 mmol/L (ref 22–32)
Calcium: 8.3 mg/dL — ABNORMAL LOW (ref 8.9–10.3)
Chloride: 103 mmol/L (ref 98–111)
Creatinine, Ser: 0.57 mg/dL (ref 0.44–1.00)
GFR, Estimated: >60 mL/min (ref >60)
Glucose, Bld: 136 mg/dL — ABNORMAL HIGH (ref 70–99)
Potassium: 3.6 mmol/L (ref 3.5–5.1)
Sodium: 138 mmol/L (ref 135–145)
Total Bilirubin: 1.1 mg/dL (ref 0.3–1.2)
Total Protein: 7 g/dL (ref 6.5–8.1)

## 2022-06-03 LAB — HIV ANTIBODY (ROUTINE TESTING W REFLEX): HIV Screen 4th Generation wRfx: NONREACTIVE

## 2022-06-03 LAB — CBC
HCT: 39.1 % (ref 36.0–46.0)
Hemoglobin: 12.6 g/dL (ref 12.0–15.0)
MCH: 30.3 pg (ref 26.0–34.0)
MCHC: 32.2 g/dL (ref 30.0–36.0)
MCV: 94 fL (ref 80.0–100.0)
Platelets: 262 10*3/uL (ref 150–400)
RBC: 4.16 MIL/uL (ref 3.87–5.11)
RDW: 13.2 % (ref 11.5–15.5)
WBC: 9.5 10*3/uL (ref 4.0–10.5)
nRBC: 0 % (ref 0.0–0.2)

## 2022-06-03 LAB — GLUCOSE, CAPILLARY
Glucose-Capillary: 115 mg/dL — ABNORMAL HIGH (ref 70–99)
Glucose-Capillary: 187 mg/dL — ABNORMAL HIGH (ref 70–99)
Glucose-Capillary: 168 mg/dL — ABNORMAL HIGH (ref 70–99)
Glucose-Capillary: 159 mg/dL — ABNORMAL HIGH (ref 70–99)

## 2022-06-03 MED ORDER — ENOXAPARIN SODIUM 40 MG/0.4ML IJ SOSY
40.0000 mg | PREFILLED_SYRINGE | Freq: Every day | INTRAMUSCULAR | Status: DC
  Administered 2022-06-03 – 2022-06-04 (×2): 40 mg via SUBCUTANEOUS
  Filled 2022-06-03 (×2): qty 0.4

## 2022-06-03 NOTE — Assessment & Plan Note (Signed)
-   Currently normotensive - Continue lisinopril-HCTZ

## 2022-06-03 NOTE — Assessment & Plan Note (Signed)
-   Continue Jardiance - Continue SSI and CBG monitoring - Last A1c 8.2% January 2023

## 2022-06-03 NOTE — Assessment & Plan Note (Signed)
Continue gabapentin.

## 2022-06-03 NOTE — Hospital Course (Addendum)
Donna [name] is a 56 y.o. female with pertinent PMH DMII, peripheral neuropathy, HLD, HTN who presented with an ongoing right foot wound.  She had been on outpatient antibiotics and using offloading shoe however continued to have progressively worsening pain and drainage associated with the wound. MRI right foot was obtained in the ER which showed bone marrow edema involving distal phalanx of the fifth toe and subtly involving proximal phalanx with findings concerning for acute osteomyelitis.  Also subcutaneous edema appreciated within soft tissues of the forefoot likely reflecting cellulitis. She was evaluated by orthopedic surgery and underwent right fifth ray amputation on 06/02/2022.

## 2022-06-03 NOTE — Assessment & Plan Note (Addendum)
-   Nonhealing wound; patient trialed antibiotics and offloading weight measures with no significant improvement -MRI right foot showed "Bone marrow edema within the distal phalanx of the fifth toe and more subtly within the proximal phalanx of the fifth toe. Findings are suspicious for early acute osteomyelitis." - wound cultures in progress; notable for GPC currently. The metatarsal head culture is negative (other 2 are growing GPC) -Continue vancomycin and cefepime for now - I suspect the MT head culture suggests clear margins but will ask for ID input regarding length of abx course and de-escalation

## 2022-06-03 NOTE — Assessment & Plan Note (Signed)
Continue Zoloft 

## 2022-06-03 NOTE — Assessment & Plan Note (Signed)
-   MRI also noting subcutaneous edema and enhancement within soft tissues of the forefoot -Treatment for osteomyelitis should cover adequate course

## 2022-06-03 NOTE — Progress Notes (Signed)
Progress Note    Donna [name]   DUK:[id number]  DOB: 26-Feb-1966  DOA: 06/02/2022     1 PCP: Myrlene Broker, MD  Initial CC: right foot wound  Hospital Course: Donna [name] is a 56 y.o. female with pertinent PMH DMII, peripheral neuropathy, HLD, HTN who presented with an ongoing right foot wound.  She had been on outpatient antibiotics and using offloading shoe however continued to have progressively worsening pain and drainage associated with the wound. MRI right foot was obtained in the ER which showed bone marrow edema involving distal phalanx of the fifth toe and subtly involving proximal phalanx with findings concerning for acute osteomyelitis.  Also subcutaneous edema appreciated within soft tissues of the forefoot likely reflecting cellulitis. She was evaluated by orthopedic surgery and underwent right fifth ray amputation on 06/02/2022.  Interval History:  No events overnight.  Patient ambulating hallway easily this morning and resting comfortably in bed when seen later on. Pain well controlled.  Assessment and Plan: * Osteomyelitis of fifth toe of right foot (HCC) - Nonhealing wound; patient trialed antibiotics and offloading weight measures with no significant improvement -MRI right foot showed "Bone marrow edema within the distal phalanx of the fifth toe and more subtly within the proximal phalanx of the fifth toe. Findings are suspicious for early acute osteomyelitis." - wound cultures in progress; notable for GPC currently. The metatarsal head culture is negative (other 2 are growing GPC) -Continue vancomycin and cefepime for now - I suspect the MT head culture suggests clear margins but will ask for ID input regarding length of abx course and de-escalation  Cellulitis of right foot - MRI also noting subcutaneous edema and enhancement within soft tissues of the forefoot -Treatment for osteomyelitis should cover adequate course  Diabetic neuropathy (HCC) -  Continue gabapentin  Essential hypertension - Currently normotensive - Continue lisinopril-HCTZ  Depression - Continue Zoloft  Hyperlipidemia associated with type 2 diabetes mellitus (HCC) - Continue Lipitor  Diabetes mellitus type 2 with complications (HCC) - Continue Jardiance - Continue SSI and CBG monitoring - Last A1c 8.2% January 2023   Old records reviewed in assessment of this patient  Antimicrobials: Vancomycin 10/14 >> current Cefepime 10/14 >> current   DVT prophylaxis:  enoxaparin (LOVENOX) injection 40 mg Start: 06/03/22 1030 SCDs Start: 06/02/22 1613   Code Status:   Code Status: Full Code  Mobility Assessment (last 72 hours)     Mobility Assessment     Row Name 06/03/22 0200 06/02/22 2105 06/02/22 1605       Does patient have an order for bedrest or is patient medically unstable -- No - Continue assessment No - Continue assessment     What is the highest level of mobility based on the progressive mobility assessment? Level 6 (Walks independently in room and hall) - Balance while walking in room without assist - Complete -- Level 5 (Walks with assist in room/hall) - Balance while stepping forward/back and can walk in room with assist - Complete              Barriers to discharge: none Disposition Plan:  Home 1-2 days Status is: Inpt  Objective: Blood pressure 122/87, pulse 79, temperature 98.1 F (36.7 C), temperature source Oral, resp. rate 17, weight 101.2 kg, last menstrual period 09/27/2011, SpO2 96 %.  Examination:  Physical Exam Constitutional:      General: She is not in acute distress.    Appearance: Normal appearance. She is not ill-appearing.  HENT:  Head: Normocephalic and atraumatic.     Mouth/Throat:     Mouth: Mucous membranes are moist.  Eyes:     Extraocular Movements: Extraocular movements intact.  Cardiovascular:     Rate and Rhythm: Normal rate and regular rhythm.  Pulmonary:     Effort: Pulmonary effort is  normal.     Breath sounds: Normal breath sounds. No wheezing.  Abdominal:     General: Bowel sounds are normal. There is no distension.     Palpations: Abdomen is soft.     Tenderness: There is no abdominal tenderness.  Musculoskeletal:        General: No swelling.     Cervical back: Normal range of motion and neck supple.     Comments: No appreciated wound in left foot. Right foot noted with hard callus involving distal aspect great toe (no drainage appreciated). Surgical dressing in place covering remainder of foot  Skin:    General: Skin is warm and dry.  Neurological:     General: No focal deficit present.     Mental Status: She is alert.  Psychiatric:        Mood and Affect: Mood normal.        Behavior: Behavior normal.      Consultants:  Orthopedic surgery  Procedures:  10/14: Right fifth ray amputation  Data Reviewed: Results for orders placed or performed during the hospital encounter of 06/02/22 (from the past 24 hour(s))  Glucose, capillary     Status: None   Collection Time: 06/02/22  4:02 PM  Result Value Ref Range   Glucose-Capillary 83 70 - 99 mg/dL  Lactic acid, plasma     Status: None   Collection Time: 06/02/22  4:06 PM  Result Value Ref Range   Lactic Acid, Venous 1.1 0.5 - 1.9 mmol/L  Glucose, capillary     Status: None   Collection Time: 06/02/22  5:54 PM  Result Value Ref Range   Glucose-Capillary 74 70 - 99 mg/dL  Aerobic/Anaerobic Culture w Gram Stain (surgical/deep wound)     Status: None (Preliminary result)   Collection Time: 06/02/22  7:17 PM   Specimen: Wound  Result Value Ref Range   Specimen Description      WOUND R FIFTH TOE Performed at [place] [address], [place], [place] [address]    Special Requests      ID A Performed at [facility], [place] [address] [place], [place] [address]    Gram Stain NO WBC SEEN FEW GRAM POSITIVE COCCI IN PAIRS     Culture      TOO YOUNG TO  READ Performed at [facility], [place] [address] [place], [place] [address]    Report Status PENDING   Glucose, capillary     Status: Abnormal   Collection Time: 06/02/22  7:32 PM  Result Value Ref Range   Glucose-Capillary 135 (H) 70 - 99 mg/dL  Aerobic/Anaerobic Culture w Gram Stain (surgical/deep wound)     Status: None (Preliminary result)   Collection Time: 06/02/22  7:33 PM   Specimen: Wound  Result Value Ref Range   Specimen Description      BONE FIFTH METATARSAL HEAD Performed at [facility] [address], [place], [place] [address]    Special Requests      ID B Performed at [place] [address] [place],  [address]    Gram Stain  NO ORGANISMS SEEN NO WBC SEEN Performed at [facility], [address], [place], [place] [address]    Culture PENDING    Report Status PENDING   Aerobic/Anaerobic Culture w Gram Stain (surgical/deep wound)     Status: None (Preliminary result)   Collection Time: 06/02/22  7:43 PM   Specimen: Wound; Tissue  Result Value Ref Range   Specimen Description      TOE R FIFTH METATARSAL Performed at [facility], [address] [place], [place] [address]    Special Requests      ID C Performed at [facility], [address] [place], [place] [address]    Gram Stain      NO WBC SEEN RARE GRAM POSITIVE COCCI IN SINGLES Performed at [facility], [address], [place], [place] [address]    Culture PENDING    Report Status PENDING   Glucose, capillary     Status: None   Collection Time: 06/02/22  9:35 PM  Result Value Ref Range   Glucose-Capillary 89 70 - 99 mg/dL  HIV Antibody (routine testing w rflx)     Status: None   Collection Time: 06/03/22  5:16 AM  Result Value Ref Range   HIV Screen 4th Generation wRfx Non Reactive Non Reactive  Comprehensive metabolic panel     Status: Abnormal   Collection Time: 06/03/22  5:16 AM  Result  Value Ref Range   Sodium 138 135 - 145 mmol/L   Potassium 3.6 3.5 - 5.1 mmol/L   Chloride 103 98 - 111 mmol/L   CO2 27 22 - 32 mmol/L   Glucose, Bld 136 (H) 70 - 99 mg/dL   BUN 15 6 - 20 mg/dL   Creatinine, Ser 1.94 0.44 - 1.00 mg/dL   Calcium 8.3 (L) 8.9 - 10.3 mg/dL   Total Protein 7.0 6.5 - 8.1 g/dL   Albumin 3.4 (L) 3.5 - 5.0 g/dL   AST 18 15 - 41 U/L   ALT 39 0 - 44 U/L   Alkaline Phosphatase 76 38 - 126 U/L   Total Bilirubin 1.1 0.3 - 1.2 mg/dL   GFR, Estimated >17 >40 mL/min   Anion gap 8 5 - 15  CBC     Status: None   Collection Time: 06/03/22  5:16 AM  Result Value Ref Range   WBC 9.5 4.0 - 10.5 K/uL   RBC 4.16 3.87 - 5.11 MIL/uL   Hemoglobin 12.6 12.0 - 15.0 g/dL   HCT 81.4 48.1 - 85.6 %   MCV 94.0 80.0 - 100.0 fL   MCH 30.3 26.0 - 34.0 pg   MCHC 32.2 30.0 - 36.0 g/dL   RDW 31.4 97.0 - 26.3 %   Platelets 262 150 - 400 K/uL   nRBC 0.0 0.0 - 0.2 %  Glucose, capillary     Status: Abnormal   Collection Time: 06/03/22  8:08 AM  Result Value Ref Range   Glucose-Capillary 115 (H) 70 - 99 mg/dL    I have Reviewed nursing notes, Vitals, and Lab results since pt's last encounter. Pertinent lab results : see above I have ordered test including BMP, CBC, Mg I have reviewed the last note from staff over past 24 hours I have discussed pt's care plan and test results with nursing staff, case manager   LOS: 1 day   Lewie Chamber, MD [facility] 06/03/2022, 1:00 PM

## 2022-06-03 NOTE — Progress Notes (Signed)
   Subjective: 1 Day Post-Op Procedure(s) (LRB): RIGHT FIFTH RAY AMPUTATION (Right)  Pt c/o mild soreness but overall doing well Using darco shoe with ambulation Denies any new symptoms or issues Patient reports pain as mild.  Objective:   VITALS:   Vitals:   06/02/22 2049 06/03/22 0450  BP: 121/75 122/87  Pulse: 88 79  Resp: 20 17  Temp: 98.4 F (36.9 C) 98.1 F (36.7 C)  SpO2: 91% 96%    Right foot dressing intact Nv intact distally in other toes No signs of drainage or bleeding  LABS Recent Labs    06/02/22 1033 06/03/22 0516  HGB 14.4 12.6  HCT 44.4 39.1  WBC 13.0* 9.5  PLT 284 262    Recent Labs    06/02/22 1033 06/03/22 0516  NA 135 138  K 3.6 3.6  BUN 15 15  CREATININE 0.60 0.57  GLUCOSE 114* 136*     Assessment/Plan: 1 Day Post-Op Procedure(s) (LRB): RIGHT FIFTH RAY AMPUTATION (Right) Continue IV antibiotics Darco shoe for ambulation Plan for d/c tomorrow Pain management as needed     Kathrynn Speed, [place] [facility] now [employer] [facility] [address] [place], [place], [place] [address] Phone: [phone] [email or web address] Facebook  [employer]

## 2022-06-03 NOTE — Assessment & Plan Note (Signed)
-  Continue Lipitor °

## 2022-06-04 ENCOUNTER — Other Ambulatory Visit (HOSPITAL_COMMUNITY): Payer: Self-pay

## 2022-06-04 ENCOUNTER — Encounter: Payer: Self-pay | Admitting: Internal Medicine

## 2022-06-04 DIAGNOSIS — M869 Osteomyelitis, unspecified: Secondary | ICD-10-CM | POA: Diagnosis not present

## 2022-06-04 LAB — GLUCOSE, CAPILLARY
Glucose-Capillary: 77 mg/dL (ref 70–99)
Glucose-Capillary: 107 mg/dL — ABNORMAL HIGH (ref 70–99)
Glucose-Capillary: 183 mg/dL — ABNORMAL HIGH (ref 70–99)

## 2022-06-04 MED ORDER — LINEZOLID 600 MG PO TABS
600.0000 mg | ORAL_TABLET | Freq: Two times a day (BID) | ORAL | 0 refills | Status: AC
  Filled 2022-06-04: qty 20, 10d supply, fill #0

## 2022-06-04 MED ORDER — LINEZOLID 600 MG PO TABS
600.0000 mg | ORAL_TABLET | Freq: Two times a day (BID) | ORAL | Status: DC
  Administered 2022-06-04: 600 mg via ORAL
  Filled 2022-06-04: qty 1

## 2022-06-04 MED ORDER — VANCOMYCIN HCL 750 MG/150ML IV SOLN
750.0000 mg | Freq: Two times a day (BID) | INTRAVENOUS | Status: DC
  Filled 2022-06-04: qty 150

## 2022-06-04 NOTE — Consult Note (Signed)
[facility] for Infectious Disease       Reason for Consult: osteomyelitis    Referring Physician: Dr. Sabino Gasser  Principal Problem:   Osteomyelitis of fifth toe of right foot ([place]) Active Problems:   Diabetes mellitus type 2 with complications ([place])   Hyperlipidemia associated with type 2 diabetes mellitus ([place])   Depression   Essential hypertension   Diabetic neuropathy (HCC)   Cellulitis of right foot    atorvastatin  80 mg Oral QHS   empagliflozin  25 mg Oral Daily   enoxaparin (LOVENOX) injection  40 mg Subcutaneous Daily   gabapentin  600 mg Oral BID   lisinopril  20 mg Oral Daily   And   hydrochlorothiazide  25 mg Oral Daily   insulin aspart  0-15 Units Subcutaneous TID WC   insulin aspart  0-5 Units Subcutaneous QHS   sertraline  100 mg Oral Daily    Recommendations: Stop cefepime Linezolid 600 mg twice a day for 10 days Follow up with me next week - 10/24 at 3:30 pm [facility] for discharge from ID standpoint  Assessment: She has a acute toe infection with bone involvement now s/p amputation.    Antibiotics: Vancomcyin + cefepime x 3 days  HPI: Donna [name] is a 56 y.o. female with a history of type 2 diabetes, peripheral neuropathy and a Hgb A1c of 8.0 who came in with worsening toe wound of the right fifth toe and MRI findings suspicious for early acute osteomyelitis. This happened rather quickly over about 24-48 hours.  Had gone to urgent care and started on antibiotics.  + subjective fever.  No fever here.  WBC 9.5.  Now s/p amputation of the 5th toe.  Culture growth with Staph aureus. Blood cultures remain negative. Operative report notes necrotic tissue, purulence and swab culture with Staph aureus in multiple cultures.     Review of Systems:  Constitutional: negative for fevers and chills All other systems reviewed and are negative    Past Medical History:  Diagnosis Date   Diabetes mellitus    type 2- uncontrolled   Hyperlipidemia     Hypertension    Lumbar disc disease    Obesity     Social History   Tobacco Use   Smoking status: Never   Smokeless tobacco: Never  Substance Use Topics   Alcohol use: No   Drug use: No    Family History  Problem Relation Age of Onset   Other Father        CHF   Coronary artery disease Father    Cancer Father        renal cell carcinoma   Hypertension Father    Diabetes Father    Diabetes Mother    Coronary artery disease Mother    Heart attack Mother    Cancer Paternal Aunt        Breast   Breast cancer Paternal Aunt 15   Atrial fibrillation Other    Peripheral vascular disease Other    Heart disease Other     No Known Allergies  Physical Exam: Constitutional: in no apparent distress  Vitals:   06/03/22 2129 06/04/22 0534  BP: (!) 141/78 (!) 114/58  Pulse: 95 79  Resp: (!) 22 19  Temp: 99.6 F (37.6 C) 99.1 F (37.3 C)  SpO2: 97% 98%   EYES: anicteric Respiratory: normal respiratory effort Musculoskeletal: right foot wrapped Skin: no rash  Lab Results  Component Value Date   WBC 9.5  06/03/2022   HGB 12.6 06/03/2022   HCT 39.1 06/03/2022   MCV 94.0 06/03/2022   PLT 262 06/03/2022    Lab Results  Component Value Date   CREATININE 0.57 06/03/2022   BUN 15 06/03/2022   NA 138 06/03/2022   K 3.6 06/03/2022   CL 103 06/03/2022   CO2 27 06/03/2022    Lab Results  Component Value Date   ALT 39 06/03/2022   AST 18 06/03/2022   ALKPHOS 76 06/03/2022     Microbiology: Recent Results (from the past 240 hour(s))  Blood culture (routine x 2)     Status: None (Preliminary result)   Collection Time: 06/02/22 10:48 AM   Specimen: BLOOD  Result Value Ref Range Status   Specimen Description   Final    BLOOD LEFT ANTECUBITAL Performed at [facility], [place] [address], [place], [place] [address]    Special Requests   Final    BOTTLES DRAWN AEROBIC AND ANAEROBIC Blood Culture adequate volume Performed at [facility] [place] [address], [place], [place] [address]    Culture   Final    NO GROWTH 2 DAYS Performed at [facility] [address], [place], [place] [address]    Report Status PENDING  Incomplete  Blood culture (routine x 2)     Status: None (Preliminary result)   Collection Time: 06/02/22 10:53 AM   Specimen: BLOOD  Result Value Ref Range Status   Specimen Description   Final    BLOOD RIGHT ANTECUBITAL Performed at [facility] [address] [place], [place] [address]    Special Requests   Final    BOTTLES DRAWN AEROBIC ONLY Blood Culture adequate volume Performed at [facility] [address], [place], [place] [address]    Culture   Final    NO GROWTH 2 DAYS Performed at [facility] [address], [place], [place] [address]    Report Status PENDING  Incomplete  Aerobic/Anaerobic Culture w Gram Stain (surgical/deep wound)     Status: None (Preliminary result)   Collection Time: 06/02/22  7:17 PM   Specimen: Wound  Result Value Ref Range Status   Specimen Description   Final    WOUND R FIFTH TOE Performed at [facility] [address], [place], [place] [address]    Special Requests   Final    ID A Performed at [facility], [place] [address] [place], [place] [address]    Gram Stain NO WBC SEEN FEW GRAM POSITIVE COCCI IN PAIRS   Final   Culture   Final    TOO YOUNG TO READ Performed at [facility], [place] [address], [place], [place] [address]    Report Status PENDING  Incomplete  Aerobic/Anaerobic Culture w Gram Stain (surgical/deep wound)     Status: None (Preliminary result)   Collection Time: 06/02/22  7:33 PM   Specimen: Wound  Result Value Ref Range Status   Specimen Description   Final    BONE FIFTH METATARSAL HEAD Performed at [place] [address] [place],  [address]    Special Requests   Final    ID B Performed at  [facility] [address], [place], [place] [address]    Gram Stain NO ORGANISMS SEEN NO WBC SEEN   Final   Culture   Final    RARE STAPHYLOCOCCUS AUREUS SUSCEPTIBILITIES TO  FOLLOW CRITICAL RESULT CALLED TO, READ BACK BY AND VERIFIED WITH: RN [email or web address] AT 0909 ON 06/04/2022 BY T.SAAD. Performed at [facility], [place] [address], [place], [place] [address]    Report Status PENDING  Incomplete  Aerobic/Anaerobic Culture w Gram Stain (surgical/deep wound)     Status: None (Preliminary result)   Collection Time: 06/02/22  7:43 PM   Specimen: Wound; Tissue  Result Value Ref Range Status   Specimen Description   Final    TOE R FIFTH METATARSAL Performed at [place] [address] [place], [place] [address]    Special Requests   Final    ID C Performed at [facility], [place] [address] [place], [place] [address]    Gram Stain NO WBC SEEN RARE GRAM POSITIVE COCCI IN SINGLES   Final   Culture   Final    RARE STAPHYLOCOCCUS AUREUS SUSCEPTIBILITIES TO FOLLOW Performed at [facility], [place] [address] [place], [place] [address]    Report Status PENDING  Incomplete    Thayer Headings, [place] for Infectious Disease [place] [email or web address] 06/04/2022, 9:52 AM

## 2022-06-04 NOTE — Progress Notes (Signed)
Pharmacy Antibiotic Note  Donna [name] is a 56 y.o. female with history of T2DM admitted on 06/02/2022 with complaint of toe swelling, erythema and fever. The 5th right toe was blistered and had drainage. Of note she went to urgent care prior to admission and was given Augmentin and doxycyline for the toe infection prior to admission. After the patient did not improve she was admitted to the hospital. Right foot MRI on 10/14 showed findings with suspicion for early acute osteomyelitis and cellulitis. She went to the OR on 10/15 for R 5th toe amputation. She is currently on vanc and cefepime for infection.  Today, 06/04/22:   - Day #3 of vanc and cefepime - WBC wnl with last lab on 10/15 -tmax 99.6 -Scr stable - Wound culture of toe- rare staph aureus   Plan: -Change Vanc dose to 750 mg Q 12 H infused over 1 hour. Estimated AUC 454 -Continue cefepime 2g Q 8 H - Monitor renal function and clinical status -Follow up with susceptibility for staph  Weight: 101.2 kg (223 lb)  Temp (24hrs), Avg:99.4 F (37.4 C), Min:99.1 F (37.3 C), Max:99.6 F (37.6 C)  Recent Labs  Lab 06/02/22 1033 06/02/22 1035 06/02/22 1606 06/03/22 0516  WBC 13.0*  --   --  9.5  CREATININE 0.60  --   --  0.57  LATICACIDVEN  --  1.2 1.1  --     Estimated Creatinine Clearance: 94.3 mL/min (by C-G formula based on SCr of 0.57 mg/dL).    No Known Allergies  Antimicrobials this admission: 10/14 Vanco >> 10/14 Cefepime >>   10/14: 5th toe: GPC in singles 10/14: 5th toe bone: 10/14: rth toe wound: GPC pairs 10/14: BC x 2:  Dose adjustments this admission: Vanc changed to 750 mg Q 12 H  Microbiology results: Awaiting culture results Thank you for allowing pharmacy to be a part of this patient's care.  Rigoberto Noel 06/04/2022 9:17 AM

## 2022-06-04 NOTE — Discharge Summary (Addendum)
Physician Discharge Summary   Donna [name] DPO:[id number] DOB: 1966-04-17 DOA: 06/02/2022  PCP: Myrlene Broker, MD  Admit date: 06/02/2022 Discharge date: 06/04/2022  Barriers to discharge: none  Admitted From: Home Disposition:  Home Discharging physician: Lewie Chamber, MD  Recommendations for Outpatient Follow-up:  Follow up with ID. Follow up bone pathology results Follow up with orthopedic surgery Patient recommended to remain out of work for ~4 weeks or as per further orthopedic surgery recommendations at follow up  Consider candidacy for Ozempic for weight loss and/or further DM control  Home Health:  Equipment/Devices:   Discharge Condition: stable CODE STATUS: Full Diet recommendation:  Diet Orders (From admission, onward)     Start     Ordered   06/04/22 0000  Diet Carb Modified        06/04/22 1215   06/03/22 0412  Diet Carb Modified Fluid consistency: Thin; Room service appropriate? Yes  Diet effective now       Question Answer Comment  Diet-HS Snack? Nothing   Calorie Level Medium 1600-2000   Fluid consistency: Thin   Room service appropriate? Yes      06/03/22 0411            Hospital Course: Donna [name] is a 56 y.o. female with pertinent PMH DMII, peripheral neuropathy, HLD, HTN who presented with an ongoing right foot wound.  She had been on outpatient antibiotics and using offloading shoe however continued to have progressively worsening pain and drainage associated with the wound. MRI right foot was obtained in the ER which showed bone marrow edema involving distal phalanx of the fifth toe and subtly involving proximal phalanx with findings concerning for acute osteomyelitis.  Also subcutaneous edema appreciated within soft tissues of the forefoot likely reflecting cellulitis. She was evaluated by orthopedic surgery and underwent right fifth ray amputation on 06/02/2022.  Assessment and Plan: * Osteomyelitis of fifth toe of right foot  (HCC) - Nonhealing wound; patient trialed antibiotics and offloading weight measures with no significant improvement -MRI right foot showed "Bone marrow edema within the distal phalanx of the fifth toe and more subtly within the proximal phalanx of the fifth toe. Findings are suspicious for early acute osteomyelitis." - all wound/bone cultures growing staph; sens pending at discharge - ID consulted; discharged with tentative plan for 10 days Zyvox and following up bone pathology results (if positive, course possibly to be extended further) -Follow-up outpatient with ID and orthopedic surgery  Cellulitis of right foot - MRI also noting subcutaneous edema and enhancement within soft tissues of the forefoot -Treatment for osteomyelitis should cover adequate course  Diabetic neuropathy (HCC) - Continue gabapentin  Essential hypertension - Continue lisinopril-HCTZ  Depression - Continue Zoloft  Hyperlipidemia associated with type 2 diabetes mellitus (HCC) - Continue Lipitor  Diabetes mellitus type 2 with complications (HCC) - Continue Jardiance and remainder of home regimen at discharge  - Last A1c 8.2% January 2023       The patient's chronic medical conditions were treated accordingly per the patient's home medication regimen except as noted.  On day of discharge, patient was felt deemed stable for discharge. Patient/family member advised to call PCP or come back to ER if needed.   Principal Diagnosis: Osteomyelitis of fifth toe of right foot [facility])  Discharge Diagnoses: Active Hospital Problems   Diagnosis Date Noted   Osteomyelitis of fifth toe of right foot (HCC) 06/02/2022    Priority: 1.   Cellulitis of right foot 06/03/2022    Priority:  2.   Diabetic neuropathy (HCC) 12/01/2014   Diabetes mellitus type 2 with complications (HCC) 08/22/2010   Depression 08/22/2010   Essential hypertension 08/22/2010   Hyperlipidemia associated with type 2 diabetes mellitus (HCC)  08/22/2010    Resolved Hospital Problems  No resolved problems to display.     Discharge Instructions     Diet Carb Modified   Complete by: As directed    Increase activity slowly   Complete by: As directed    Leave dressing on - Keep it clean, dry, and intact until clinic visit   Complete by: As directed       Allergies as of 06/04/2022   No Known Allergies      Medication List     STOP taking these medications    amoxicillin-clavulanate 875-125 MG tablet Commonly known as: AUGMENTIN   doxycycline 100 MG capsule Commonly known as: VIBRAMYCIN       TAKE these medications    acetaminophen 500 MG tablet Commonly known as: TYLENOL Take 1,000 mg by mouth every 6 (six) hours as needed for mild pain or headache. Notes to patient: 06/04/22, every 6 hours as needed   atorvastatin 80 MG tablet Commonly known as: LIPITOR TAKE 1 TABLET BY MOUTH ONCE A DAY What changed:  how much to take when to take this Notes to patient: 06/04/22   FREESTYLE LITE test strip Generic drug: glucose blood Use as directed to test blood sugar twice daily   gabapentin 300 MG capsule Commonly known as: NEURONTIN TAKE 1 CAPSULE BY MOUTH FOUR TIMES DAILY What changed:  how much to take when to take this   Jardiance 25 MG Tabs tablet Generic drug: empagliflozin Take 1 tablet (25 mg total) by mouth daily (needs office visit) Notes to patient: 06/05/22   linezolid 600 MG tablet Commonly known as: ZYVOX Take 1 tablet (600 mg total) by mouth 2 (two) times daily for 10 days. Notes to patient: 06/04/22, evening   lisinopril-hydrochlorothiazide 20-25 MG tablet Commonly known as: ZESTORETIC TAKE 1 TABLET BY MOUTH ONCE A DAY Notes to patient: 06/05/22   metFORMIN 1000 MG tablet Commonly known as: GLUCOPHAGE TAKE 1 TABLET BY MOUTH TWICE DAILY What changed: how much to take Notes to patient: 06/05/22   sertraline 100 MG tablet Commonly known as: ZOLOFT TAKE 1 TABLET BY MOUTH ONCE  DAILY What changed: how much to take Notes to patient: 06/05/22   Evaristo Bury FlexTouch 200 UNIT/ML FlexTouch Pen Generic drug: insulin degludec Inject 110 Units into the skin daily. What changed: how much to take Notes to patient: 06/05/22               Discharge Care Instructions  (From admission, onward)           Start     Ordered   06/04/22 0000  Leave dressing on - Keep it clean, dry, and intact until clinic visit        06/04/22 1215            Follow-up Information     Netta Cedars, MD. Schedule an appointment as soon as possible for a visit in 1 week(s).   Specialty: Orthopedic Surgery Contact information: [address] [place] [address] [phone]         Gardiner Barefoot, MD. Schedule an appointment as soon as possible for a visit in 1 week(s).   Specialty: Infectious Diseases Contact information: [address] [place] [address] [phone]  No Known Allergies  Consultations: Orthopedic surgery ID  Procedures: 06/02/22: Right fifth ray amputation  Discharge Exam: BP 129/78   Pulse 79   Temp 99.1 F (37.3 C) (Oral)   Resp 19   Wt 101.2 kg   LMP 09/27/2011   SpO2 98%   BMI 35.99 kg/m  Physical Exam Constitutional:      General: She is not in acute distress.    Appearance: Normal appearance. She is not ill-appearing.  HENT:     Head: Normocephalic and atraumatic.     Mouth/Throat:     Mouth: Mucous membranes are moist.  Eyes:     Extraocular Movements: Extraocular movements intact.  Cardiovascular:     Rate and Rhythm: Normal rate and regular rhythm.  Pulmonary:     Effort: Pulmonary effort is normal.     Breath sounds: Normal breath sounds. No wheezing.  Abdominal:     General: Bowel sounds are normal. There is no distension.     Palpations: Abdomen is soft.     Tenderness: There is no abdominal tenderness.  Musculoskeletal:        General: No swelling.      Cervical back: Normal range of motion and neck supple.     Comments: No appreciated wound in left foot. Right foot noted with hard callus involving distal aspect great toe (no drainage appreciated). Surgical dressing in place covering remainder of foot. Trace to 1+ RLE edema in lower leg appreciated   Skin:    General: Skin is warm and dry.  Neurological:     General: No focal deficit present.     Mental Status: She is alert.  Psychiatric:        Mood and Affect: Mood normal.        Behavior: Behavior normal.      The results of significant diagnostics from this hospitalization (including imaging, microbiology, ancillary and laboratory) are listed below for reference.   Microbiology: Recent Results (from the past 240 hour(s))  Blood culture (routine x 2)     Status: None (Preliminary result)   Collection Time: 06/02/22 10:48 AM   Specimen: BLOOD  Result Value Ref Range Status   Specimen Description   Final    BLOOD LEFT ANTECUBITAL Performed at [facility] [address], [place], [place] [address]    Special Requests   Final    BOTTLES DRAWN AEROBIC AND ANAEROBIC Blood Culture adequate volume Performed at [facility] [address] [place], [place] [address]    Culture   Final    NO GROWTH 2 DAYS Performed at [facility] [address], [place], [place] [address]    Report Status PENDING  Incomplete  Blood culture (routine x 2)     Status: None (Preliminary result)   Collection Time: 06/02/22 10:53 AM   Specimen: BLOOD  Result Value Ref Range Status   Specimen Description   Final    BLOOD RIGHT ANTECUBITAL Performed at [facility] [address], [place], [place] [address]    Special Requests   Final    BOTTLES DRAWN AEROBIC ONLY Blood Culture adequate volume Performed at [facility] [address] [place], [place] [address]    Culture   Final    NO GROWTH 2  DAYS Performed at [facility] [address], [place],  [address]    Report Status PENDING  Incomplete  Aerobic/Anaerobic Culture w Gram Stain (surgical/deep wound)  Status: None (Preliminary result)   Collection Time: 06/02/22  7:17 PM   Specimen: Wound  Result Value Ref Range Status   Specimen Description   Final    WOUND R FIFTH TOE Performed at [facility], [address] [place], [place] [address]    Special Requests   Final    ID A Performed at [facility], [address], [place], [place] [address]    Gram Stain   Final    NO WBC SEEN FEW GRAM POSITIVE COCCI IN PAIRS Performed at [facility], [address], [place], [place] [address]    Culture   Final    MODERATE STAPHYLOCOCCUS AUREUS SUSCEPTIBILITIES TO FOLLOW NO ANAEROBES ISOLATED; CULTURE IN PROGRESS FOR 5 DAYS    Report Status PENDING  Incomplete  Aerobic/Anaerobic Culture w Gram Stain (surgical/deep wound)     Status: None (Preliminary result)   Collection Time: 06/02/22  7:33 PM   Specimen: Wound  Result Value Ref Range Status   Specimen Description   Final    BONE FIFTH METATARSAL HEAD Performed at [facility], [address], [place], [place] [address]    Special Requests   Final    ID B Performed at [facility], [address], [place], [place] [address]    Gram Stain   Final    NO ORGANISMS SEEN NO WBC SEEN Performed at [facility], [address] [place], [place] [address]    Culture   Final    RARE STAPHYLOCOCCUS AUREUS SUSCEPTIBILITIES TO FOLLOW CRITICAL RESULT CALLED TO, READ BACK BY AND VERIFIED WITH: RN ISEOMA.O AT 0909 ON 06/04/2022 BY T.SAAD. NO ANAEROBES ISOLATED; CULTURE IN PROGRESS FOR 5 DAYS    Report Status PENDING  Incomplete  Aerobic/Anaerobic Culture w Gram Stain (surgical/deep wound)     Status: None (Preliminary result)   Collection Time: 06/02/22  7:43 PM    Specimen: Wound; Tissue  Result Value Ref Range Status   Specimen Description   Final    TOE R FIFTH METATARSAL Performed at [facility], [address], [place], [place] [address]    Special Requests   Final    ID C Performed at [facility], [address], [place], [place] [address]    Gram Stain   Final    NO WBC SEEN RARE GRAM POSITIVE COCCI IN SINGLES Performed at [facility], [address] [place], [place] [address]    Culture   Final    RARE STAPHYLOCOCCUS AUREUS SUSCEPTIBILITIES TO FOLLOW NO ANAEROBES ISOLATED; CULTURE IN PROGRESS FOR 5 DAYS    Report Status PENDING  Incomplete     Labs: BNP (last 3 results) No results for input(s): "BNP" in the last 8760 hours. Basic Metabolic Panel: Recent Labs  Lab 06/02/22 1033 06/03/22 0516  NA 135 138  K 3.6 3.6  CL 100 103  CO2 27 27  GLUCOSE 114* 136*  BUN 15 15  CREATININE 0.60 0.57  CALCIUM 8.9 8.3*   Liver Function Tests: Recent Labs  Lab 06/03/22 0516  AST 18  ALT 39  ALKPHOS 76  BILITOT 1.1  PROT 7.0  ALBUMIN 3.4*   No results for input(s): "LIPASE", "AMYLASE" in the last 168 hours. No results for input(s): "AMMONIA" in the last 168 hours. CBC: Recent Labs  Lab 06/02/22 1033 06/03/22 0516  WBC 13.0* 9.5  NEUTROABS 10.3*  --   HGB 14.4  12.6  HCT 44.4 39.1  MCV 93.1 94.0  PLT 284 262   Cardiac Enzymes: No results for input(s): "CKTOTAL", "CKMB", "CKMBINDEX", "TROPONINI" in the last 168 hours. BNP: Invalid input(s): "POCBNP" CBG: Recent Labs  Lab 06/03/22 1312 06/03/22 1630 06/03/22 2127 06/04/22 0729 06/04/22 1146  GLUCAP 187* 168* 159* 107* 183*   D-Dimer No results for input(s): "DDIMER" in the last 72 hours. Hgb A1c No results for input(s): "HGBA1C" in the last 72 hours. Lipid Profile No results for input(s): "CHOL", "HDL", "LDLCALC", "TRIG", "CHOLHDL", "LDLDIRECT" in the last 72 hours. Thyroid function studies No results for input(s):  "TSH", "T4TOTAL", "T3FREE", "THYROIDAB" in the last 72 hours.  Invalid input(s): "FREET3" Anemia work up No results for input(s): "VITAMINB12", "FOLATE", "FERRITIN", "TIBC", "IRON", "RETICCTPCT" in the last 72 hours. Urinalysis    Component Value Date/Time   LABSPEC >=1.030 12/17/2010 1645   PHURINE 5.0 12/17/2010 1645   GLUCOSEU 500 (A) 12/17/2010 1645   HGBUR LARGE (A) 12/17/2010 1645   BILIRUBINUR MODERATE (A) 12/17/2010 1645   KETONESUR 15 (A) 12/17/2010 1645   PROTEINUR >=300 (A) 12/17/2010 1645   UROBILINOGEN 1.0 12/17/2010 1645   NITRITE POSITIVE (A) 12/17/2010 1645   LEUKOCYTESUR (A) 12/17/2010 1645    TRACE Biochemical Testing Only. Please order routine urinalysis from main lab if confirmatory testing is needed.   Sepsis Labs Recent Labs  Lab 06/02/22 1033 06/03/22 0516  WBC 13.0* 9.5   Microbiology Recent Results (from the past 240 hour(s))  Blood culture (routine x 2)     Status: None (Preliminary result)   Collection Time: 06/02/22 10:48 AM   Specimen: BLOOD  Result Value Ref Range Status   Specimen Description   Final    BLOOD LEFT ANTECUBITAL Performed at [facility], [address] [place], [place] [address]    Special Requests   Final    BOTTLES DRAWN AEROBIC AND ANAEROBIC Blood Culture adequate volume Performed at [facility], [address], [place], [place] [address]    Culture   Final    NO GROWTH 2 DAYS Performed at [facility], [address] [place], [place] [address]    Report Status PENDING  Incomplete  Blood culture (routine x 2)     Status: None (Preliminary result)   Collection Time: 06/02/22 10:53 AM   Specimen: BLOOD  Result Value Ref Range Status   Specimen Description   Final    BLOOD RIGHT ANTECUBITAL Performed at [facility], [address] [place], [place] [address]    Special Requests   Final    BOTTLES DRAWN AEROBIC ONLY Blood Culture adequate  volume Performed at [facility], [address], [place], [place] [address]    Culture   Final    NO GROWTH 2 DAYS Performed at [facility], [address], [place], [place] [address]    Report Status PENDING  Incomplete  Aerobic/Anaerobic Culture w Gram Stain (surgical/deep wound)     Status: None (Preliminary result)   Collection Time: 06/02/22  7:17 PM   Specimen: Wound  Result Value Ref Range Status   Specimen Description   Final    WOUND R FIFTH TOE Performed at [facility], [address] [place], [place] [address]    Special Requests   Final    ID A Performed at [facility], [address] [place], [place] [address]    Gram Stain   Final  NO WBC SEEN FEW GRAM POSITIVE COCCI IN PAIRS Performed at [facility], [address], [place], [place] [address]    Culture   Final    MODERATE STAPHYLOCOCCUS AUREUS SUSCEPTIBILITIES TO FOLLOW NO ANAEROBES ISOLATED; CULTURE IN PROGRESS FOR 5 DAYS    Report Status PENDING  Incomplete  Aerobic/Anaerobic Culture w Gram Stain (surgical/deep wound)     Status: None (Preliminary result)   Collection Time: 06/02/22  7:33 PM   Specimen: Wound  Result Value Ref Range Status   Specimen Description   Final    BONE FIFTH METATARSAL HEAD Performed at [facility], [address] [place], [place] [address]    Special Requests   Final    ID B Performed at [facility], [address], [place], [place] [address]    Gram Stain   Final    NO ORGANISMS SEEN NO WBC SEEN Performed at [facility], [address] [place], [place] [address]    Culture   Final    RARE STAPHYLOCOCCUS AUREUS SUSCEPTIBILITIES TO FOLLOW CRITICAL RESULT CALLED TO, READ BACK BY AND VERIFIED WITH: RN ISEOMA.O AT 0909 ON 06/04/2022 BY T.SAAD. NO ANAEROBES ISOLATED; CULTURE IN PROGRESS FOR 5 DAYS    Report Status PENDING  Incomplete   Aerobic/Anaerobic Culture w Gram Stain (surgical/deep wound)     Status: None (Preliminary result)   Collection Time: 06/02/22  7:43 PM   Specimen: Wound; Tissue  Result Value Ref Range Status   Specimen Description   Final    TOE R FIFTH METATARSAL Performed at [facility], [address] [place], [place] [address]    Special Requests   Final    ID C Performed at [facility], [address] [place], [place] [address]    Gram Stain   Final    NO WBC SEEN RARE GRAM POSITIVE COCCI IN SINGLES Performed at [facility], [address], [place], [place] [address]    Culture   Final    RARE STAPHYLOCOCCUS AUREUS SUSCEPTIBILITIES TO FOLLOW NO ANAEROBES ISOLATED; CULTURE IN PROGRESS FOR 5 DAYS    Report Status PENDING  Incomplete    Procedures/Studies: MR FOOT RIGHT W WO CONTRAST  Result Date: 06/02/2022 CLINICAL DATA:  Foot swelling, diabetic, osteomyelitis suspected, xray done EXAM: MRI OF THE RIGHT FOREFOOT WITHOUT AND WITH CONTRAST TECHNIQUE: Multiplanar, multisequence MR imaging of the right forefoot was performed before and after the administration of intravenous contrast. CONTRAST:  29mL GADAVIST GADOBUTROL 1 MMOL/ML IV SOLN COMPARISON:  X-ray 06/01/2022 FINDINGS: Bones/Joint/Cartilage Bone marrow edema and mild enhancement within the distal phalanx of the fifth toe. Mild marrow edema within the proximal phalanx of the fifth toe. No erosion or confluent low T1 marrow signal changes are seen. No additional sites of focal bone marrow signal abnormality. No fracture or dislocation. Mild-to-moderate degenerative changes of the forefoot. No joint effusion. Ligaments Intact Lisfranc ligament.  Intact collateral ligaments. Muscles and Tendons Denervation changes of the foot musculature. Intact flexor and extensor tendons without tendinosis, tear, or tenosynovitis. Soft tissues Cutaneous blister at the dorsal aspect of the distal forefoot near the fifth toe.  Subcutaneous edema and enhancement within the soft tissues of the forefoot. No organized or rim enhancing fluid collection. IMPRESSION: 1. Bone marrow edema within the distal phalanx of the fifth toe and more subtly within the proximal phalanx of the fifth toe. Findings are suspicious for early acute osteomyelitis. 2. Cutaneous blister  at the dorsal aspect of the distal forefoot near the fifth toe. Subcutaneous edema and enhancement within the soft tissues of the forefoot, which may reflect cellulitis. No abscess. Electronically Signed   By: Duanne GuessNicholas  Plundo D.O.   On: 06/02/2022 11:24   DG Foot Complete Right  Result Date: 06/01/2022 CLINICAL DATA:  Right 5th toe swelling and discoloration with clinical signs of infection. EXAM: RIGHT FOOT COMPLETE - 3+ VIEW COMPARISON:  Right great toe dated 12/14/2021. FINDINGS: Mild diffuse distal soft tissue swelling. Fracture, dislocation, bone destruction, periosteal reaction or soft tissue gas. IMPRESSION: Mild diffuse distal soft tissue swelling without underlying bony abnormality. Electronically Signed   By: Beckie SaltsSteven  Reid M.D.   On: 06/01/2022 15:35     Time coordinating discharge: Over 30 minutes    Lewie Chamberavid Mykel Mohl, MD  [facility] 06/04/2022, 2:58 PM

## 2022-06-04 NOTE — Progress Notes (Signed)
Subjective: 2 Days Post-Op Procedure(s) (LRB): RIGHT FIFTH RAY AMPUTATION (Right)  Patient reports pain as mild.  She has been stable on RNF. Patient examined together at bedside with her hospitalist and nurse.  Objective:   VITALS:  Temp:  [99.1 F (37.3 C)-99.6 F (37.6 C)] 99.1 F (37.3 C) (10/16 0534) Pulse Rate:  [79-95] 79 (10/16 0534) Resp:  [19-22] 19 (10/16 0534) BP: (114-141)/(58-78) 129/78 (10/16 1024) SpO2:  [97 %-98 %] 98 % (10/16 0534)  Gen: AAOx3, NAD Comfortable at rest  Right Lower Extremity: Skin intact, incision c/d/I Resolving erythema and swelling No new blistering or erythema HF/KE/KF/ADF/APF/EHL 5/5 SILT throughout DP, PT 2+ to palp CR < 2s    LABS Recent Labs    06/02/22 1033 06/03/22 0516  HGB 14.4 12.6  WBC 13.0* 9.5  PLT 284 262   Recent Labs    06/02/22 1033 06/03/22 0516  NA 135 138  K 3.6 3.6  CL 100 103  CO2 27 27  BUN 15 15  CREATININE 0.60 0.57  GLUCOSE 114* 136*   No results for input(s): "LABPT", "INR" in the last 72 hours.   Assessment/Plan: 2 Days Post-Op Procedure(s) (LRB): RIGHT FIFTH RAY AMPUTATION (Right)  -stable on RNF -strict heel weightbearing in postop shoe operative extremity, maximum elevation -close clinical monitoring of the right lower extremity -DVT ppx: Aspirin 81 mg twice daily until fully weightbearing or per primary team preference -trend intraoperative cultures, this is now growing Staph aureus from 5th met head: discussed with ID/hospitalist colleagues who will direct abx therapy accordingly -follow up as outpatient on Fri 10/20 for wound check -would recommend keeping out of work for 4 weeks to allow elevation and wound monitoring at least until wound healing is attained -sutures out in 3-4 weeks in outpatient office  Armond Hang 06/04/2022, 11:33 AM

## 2022-06-06 LAB — SURGICAL PATHOLOGY

## 2022-06-07 LAB — AEROBIC/ANAEROBIC CULTURE W GRAM STAIN (SURGICAL/DEEP WOUND)
Gram Stain: NONE SEEN
Gram Stain: NONE SEEN
Gram Stain: NONE SEEN

## 2022-06-07 LAB — CULTURE, BLOOD (ROUTINE X 2)
Culture: NO GROWTH
Culture: NO GROWTH
Special Requests: ADEQUATE
Special Requests: ADEQUATE

## 2022-06-11 ENCOUNTER — Ambulatory Visit (HOSPITAL_BASED_OUTPATIENT_CLINIC_OR_DEPARTMENT_OTHER): Payer: 59 | Admitting: Internal Medicine

## 2022-06-12 ENCOUNTER — Other Ambulatory Visit: Payer: Self-pay

## 2022-06-12 ENCOUNTER — Ambulatory Visit: Payer: 59 | Admitting: Internal Medicine

## 2022-06-12 DIAGNOSIS — I96 Gangrene, not elsewhere classified: Secondary | ICD-10-CM | POA: Insufficient documentation

## 2022-06-12 DIAGNOSIS — Z5181 Encounter for therapeutic drug level monitoring: Secondary | ICD-10-CM | POA: Diagnosis not present

## 2022-06-12 DIAGNOSIS — L03115 Cellulitis of right lower limb: Secondary | ICD-10-CM | POA: Diagnosis not present

## 2022-06-12 NOTE — Progress Notes (Unsigned)
   Subjective:    Patient ID: Donna [name], female    DOB: 1966-02-04, 56 y.o.   MRN: [id number]  HPI Donna [name] is here for hospital follow up She was hospitalized earlier this month with right 5th toe wet gangrene and is s/p right fifth ray amputation by Dr. Kathaleen Bury on 06/02/22.  She had about 2 days of acute symptoms of swelling and erythema associated with fever and drainage.  Culture of the toe grew out MSSA and pathology with skin and soft tissue gangrenous necrosis and viable bony margins.  I had her start linezolid for 10 days which she is taking now.  Some mild rash on both legs with pruritis.     Review of Systems  Constitutional:  Negative for chills and fever.  Gastrointestinal:  Negative for diarrhea.  Skin:  Positive for rash.       Objective:   Physical Exam Eyes:     General: No scleral icterus. Skin:    Comments: + palpable dry rash on right shin; left with small maculopapular rash  Neurological:     Mental Status: She is alert.   SH: no tobacco        Assessment & Plan:

## 2022-06-12 NOTE — Assessment & Plan Note (Signed)
S/p amputation and culture with MSSA.  Pathology noted and no osteomyelitis noted and good margins.  Clinically looks good with an open wound.  No clinical concerns for ongoing infection at this time.  I will have her continue with the two final days of linezolid and stop.   She can follow up here as needed.

## 2022-06-13 ENCOUNTER — Encounter: Payer: Self-pay | Admitting: Internal Medicine

## 2022-06-13 DIAGNOSIS — Z5181 Encounter for therapeutic drug level monitoring: Secondary | ICD-10-CM | POA: Insufficient documentation

## 2022-06-13 LAB — CBC WITH DIFFERENTIAL/PLATELET
Absolute Monocytes: 362 cells/uL (ref 200–950)
Basophils Absolute: 54 cells/uL (ref 0–200)
Basophils Relative: 0.7 %
Eosinophils Absolute: 339 cells/uL (ref 15–500)
Eosinophils Relative: 4.4 %
HCT: 43 % (ref 35.0–45.0)
Hemoglobin: 14.6 g/dL (ref 11.7–15.5)
Lymphs Abs: 2295 cells/uL (ref 850–3900)
MCH: 30.5 pg (ref 27.0–33.0)
MCHC: 34 g/dL (ref 32.0–36.0)
MCV: 89.8 fL (ref 80.0–100.0)
MPV: 8.8 fL (ref 7.5–12.5)
Monocytes Relative: 4.7 %
Neutro Abs: 4651 cells/uL (ref 1500–7800)
Neutrophils Relative %: 60.4 %
Platelets: 377 10*3/uL (ref 140–400)
RBC: 4.79 10*6/uL (ref 3.80–5.10)
RDW: 12.8 % (ref 11.0–15.0)
Total Lymphocyte: 29.8 %
WBC: 7.7 10*3/uL (ref 3.8–10.8)

## 2022-06-13 LAB — COMPREHENSIVE METABOLIC PANEL
AG Ratio: 1.4 (calc) (ref 1.0–2.5)
ALT: 21 U/L (ref 6–29)
AST: 14 U/L (ref 10–35)
Albumin: 4.2 g/dL (ref 3.6–5.1)
Alkaline phosphatase (APISO): 72 U/L (ref 37–153)
BUN: 18 mg/dL (ref 7–25)
CO2: 25 mmol/L (ref 20–32)
Calcium: 9.2 mg/dL (ref 8.6–10.4)
Chloride: 103 mmol/L (ref 98–110)
Creat: 0.77 mg/dL (ref 0.50–1.03)
Globulin: 3.1 g/dL (calc) (ref 1.9–3.7)
Glucose, Bld: 172 mg/dL — ABNORMAL HIGH (ref 65–99)
Potassium: 4.2 mmol/L (ref 3.5–5.3)
Sodium: 140 mmol/L (ref 135–146)
Total Bilirubin: 0.3 mg/dL (ref 0.2–1.2)
Total Protein: 7.3 g/dL (ref 6.1–8.1)

## 2022-06-13 NOTE — Assessment & Plan Note (Signed)
Cbc and cmp checked and no concerns with normal WBC, normal platelets.

## 2022-06-13 NOTE — Assessment & Plan Note (Signed)
Cellulitis has resolved. 

## 2022-06-28 ENCOUNTER — Other Ambulatory Visit (HOSPITAL_COMMUNITY): Payer: Self-pay

## 2022-06-28 ENCOUNTER — Other Ambulatory Visit: Payer: Self-pay | Admitting: Internal Medicine

## 2022-06-29 ENCOUNTER — Encounter (HOSPITAL_BASED_OUTPATIENT_CLINIC_OR_DEPARTMENT_OTHER): Payer: 59 | Attending: Internal Medicine | Admitting: Internal Medicine

## 2022-06-29 ENCOUNTER — Other Ambulatory Visit (HOSPITAL_COMMUNITY): Payer: Self-pay

## 2022-06-29 DIAGNOSIS — E1140 Type 2 diabetes mellitus with diabetic neuropathy, unspecified: Secondary | ICD-10-CM | POA: Diagnosis not present

## 2022-06-29 DIAGNOSIS — E1152 Type 2 diabetes mellitus with diabetic peripheral angiopathy with gangrene: Secondary | ICD-10-CM | POA: Insufficient documentation

## 2022-06-29 DIAGNOSIS — I96 Gangrene, not elsewhere classified: Secondary | ICD-10-CM | POA: Diagnosis not present

## 2022-06-29 DIAGNOSIS — M869 Osteomyelitis, unspecified: Secondary | ICD-10-CM

## 2022-06-29 DIAGNOSIS — I10 Essential (primary) hypertension: Secondary | ICD-10-CM | POA: Insufficient documentation

## 2022-06-29 DIAGNOSIS — S91301A Unspecified open wound, right foot, initial encounter: Secondary | ICD-10-CM | POA: Diagnosis not present

## 2022-06-29 DIAGNOSIS — E11621 Type 2 diabetes mellitus with foot ulcer: Secondary | ICD-10-CM | POA: Insufficient documentation

## 2022-06-29 MED ORDER — DOXYCYCLINE HYCLATE 100 MG PO TABS
100.0000 mg | ORAL_TABLET | Freq: Two times a day (BID) | ORAL | 0 refills | Status: DC
  Filled 2022-06-29: qty 28, 14d supply, fill #0

## 2022-06-29 MED ORDER — GABAPENTIN 300 MG PO CAPS
ORAL_CAPSULE | Freq: Four times a day (QID) | ORAL | 1 refills | Status: DC
  Filled 2022-06-29: qty 360, 90d supply, fill #0
  Filled 2022-10-16: qty 360, 90d supply, fill #1

## 2022-06-29 NOTE — Progress Notes (Signed)
Donna [name], Donna [name] ([id number]) [id number] Page 1 of 11 Visit Report for 06/29/2022 Chief Complaint Document Details Patient Name: Date of Service: Donna [name], Donna [name] 06/29/2022 8:00 A M Medical Record Number: [id number] Patient Account Number: [id number] Date of Birth/Sex: Treating RN: 18-Feb-1966 (56 y.o. F) Primary Care Provider: Hillard Danker Other Clinician: Referring Provider: Treating Provider/Extender: Leandrew Koyanagi in Treatment: 0 Information Obtained from: Patient Chief Complaint 06/29/2022; surgical wound to the right foot status post right fifth ray amputation Electronic Signature(s) Signed: 06/29/2022 10:08:27 AM By: Geralyn Corwin DO Entered By: Geralyn Corwin on 06/29/2022 09:15:50 -------------------------------------------------------------------------------- Debridement Details Patient Name: Date of Service: Donna [name]. 06/29/2022 8:00 A M Medical Record Number: [id number] Patient Account Number: [id number] Date of Birth/Sex: Treating RN: [birth year] (56 y.o. [name], Millard.Loa Primary Care Provider: Hillard Danker Other Clinician: Referring Provider: Treating Provider/Extender: Leandrew Koyanagi in Treatment: 0 Debridement Performed for Assessment: Wound #3 Right Amputation Site - Toe Performed By: Physician Geralyn Corwin, DO Debridement Type: Debridement Severity of Tissue Pre Debridement: Fat layer exposed Level of Consciousness (Pre-procedure): Awake and Alert Pre-procedure Verification/Time Out Yes - 09:00 Taken: Start Time: 09:01 Pain Control: Lidocaine 4% T opical Solution T Area Debrided (L x W): otal 4.8 (cm) x 1.3 (cm) = 6.24 (cm) Tissue and other material debrided: Viable, Non-Viable, Eschar, Slough, Subcutaneous, Slough Level: Skin/Subcutaneous Tissue Debridement Description: Excisional Instrument: Blade, Curette, Forceps Bleeding:  Minimum Hemostasis Achieved: Pressure End Time: 09:08 Procedural Pain: 0 Post Procedural Pain: 0 Response to Treatment: Procedure was tolerated well Level of Consciousness (Post- Awake and Alert procedure): Post Debridement Measurements of Total Wound Length: (cm) 4.8 Width: (cm) 1.3 Depth: (cm) 0.9 Volume: (cm) 4.411 Character of Wound/Ulcer Post Debridement: Requires Further Debridement Severity of Tissue Post Debridement: Fat layer exposed Donna [name], Donna [name] ([id number]) [id number] Page 2 of 11 Post Procedure Diagnosis Same as Pre-procedure Notes documented by Shawn Stall, RN. Electronic Signature(s) Signed: 06/29/2022 10:08:27 AM By: Geralyn Corwin DO Signed: 06/29/2022 2:17:30 PM By: Shawn Stall RN, BSN Entered By: Shawn Stall on 06/29/2022 09:06:52 -------------------------------------------------------------------------------- HPI Details Patient Name: Date of Service: Donna [name]. 06/29/2022 8:00 A M Medical Record Number: [id number] Patient Account Number: [id number] Date of Birth/Sex: Treating RN: 1966-04-25 (56 y.o. 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 She tells me that since at least last summer she has had a progressive callused area that has opened closed but is never really healed. She has a history of calluses on her feet. She is a diabetic with neuropathy. She was referred to Dr. Allena Katz at [facility] who felt that this might be a large plantar wart she was treated with laser for 2 treatments and then subsequently with ammonium lactate none of this is apparently made any difference according to the patient. She has a very odd looking  area with thick skin and callus and an almost crossed like area of open tissue. She also has erythema above this which she says is chronic. The other interesting part of her recent history is recurrent cellulitis on the right lower leg although this is separated from the area we are looking at. She has had trips to see infectious disease has been on Keflex for as long as 20 days. This is presumed to be strep. She  saw Dr. Renold Don Past medical history includes type 2 diabetes, recurrent cellulitis of the right leg, right Achilles tendon repair a year ago for spontaneous rupture ABI in our clinic was 1.09 on the right 2/17; patient admitted the clinic last week had a thick callused area on the right lateral heel she is a diabetic with neuropathy. She had thick callus and across like area of open wound. I aggressively debrided this area to remove most of the callus and gave her Hydrofera Blue and heel off loader, heel cup and she has been wearing this religiously trying to keep the pressure off the area. She comes in today with a cross-like area of injury has healed she has a new open area that I do not remember from last time. Still some dry flaking skin and callus but not nearly as much as last week at which time I had to use a scalpel to remove this. 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 She came in 2 weeks ago with a very thick hyperkeratotic callused area on her right lateral heel. There was a "crossed" shaped area of split tissue with an open wound at the bottom. This had previously been treated as a possible wart without improvement. I removed all of the callus when she first came in here. This is mostly epithelialized over still thick skin but nothing like it was. She has 1 small open area that has not healed she has been more religious about offloading the area 10/27/2020 upon evaluation today patient appears to be doing well with regard to her wound although she does  still apparently appear to have something still open in the base of the wound. Fortunately there is no evidence of infection at this time which is great news and in general I feel like she is doing decently well though she has a lot of callus buildup she is using the offloading shoe for her heel. At home she does not always wear this she tells me. Generally she is using flip-flops there. Of note her primary care provider did give her Keflex yesterday for cellulitis of this leg but has nothing to do with the heel location. 3/17; the patient's area actually looks a lot better. There is no open wound here. Much less callus. She was given a prescription for 40% urea cream I am not sure that really is required this week. She is going to require less friction on this area. I think she is going to need a different type of shoe going forward Readmission: 12-13-2028 upon evaluation today patient presents for reevaluation in the clinic she was last seen by Dr. Leanord Hawking on 11-03-2020 when she was discharged this was due to a crack on her heel. Currently she is actually having an issue with her great toe on the right foot. She tells me that this has been present currently for a couple of weeks since around November 22, 2021. She has had a lot of callus over this area in general but not an open wound until that time. Subsequently this has more recently opened and has Become a greater concern she is also noted some odor which is disconcerting to her. She tells me that her greatest concern is that "she does not lose this toe". Again I completely understand her concern in this regard I definitely think we need to be aggressive in treating this and try to get things better. She is very Adult nurse. Patient does have a history of diabetes  mellitus type 2 with diabetic neuropathy. She also tells me that she does have a history as well of hypertension which is pretty well controlled. She has not yet had an x-ray of the area and  has not been on any antibiotics. Both are things I think we will need to do today. 12-20-2021 upon evaluation today patient appears to be doing well with regard to her toe ulcer all things considered. She has been tolerating the dressing changes without complication. I do have her x-ray as well as her culture for review today the culture did so Staphylococcus aureus no MRSA noted which was great news susceptible to doxycycline which is what I did place her on last week. Overall the odor that she was noticing has also cleared which is great news. With regard to the x-ray there does not appear to be any signs of osteomyelitis or bone erosion at this point which is also great news in general and very pleased with where things stand. 12-27-2021 upon evaluation today patient appears to be doing well with regard to her wound this is actually measuring significantly better which is great news. Overall I am extremely pleased with where we stand today. No fevers, chills, nausea, vomiting, or diarrhea. TYKESHA, KONICKI ([id number]) [id number] Page 3 of 11 01-03-2022 upon evaluation today patient's wound is actually showing signs of significant improvement which is also news. I am very pleased with what we are seeing there is a little bit of callus buildup still but we are going to work on that today otherwise she seems to be making excellent progress which is great news. 01-10-2022 upon evaluation today patient appears to be doing well currently in regard to her toe ulcer. She has been tolerating the dressing changes without complication. Fortunately there does not appear to be any evidence of active infection locally or systemically which is great news. No fevers, chills, nausea, vomiting, or diarrhea. 01-17-2022 upon evaluation today patient's toe was actually doing about the same this week there is not a tremendous improvement in size but overall the appearance of the wound is better  it is much flatter I do think the [facility] has done well in that regard. We will still wait to hear back on the skin substitute. With that being said I do believe that the patient could benefit from again the skin substitute to try to help get this to heal much more effectively and quickly in the meantime I am trying to help flatten this out which I think is doing a pretty good job.. 6/7; wound on the plantar right great toe over the distal phalanx. Wound is slightly smaller but looks healthier there is rims of epithelialization she still has some callus on the toe indicative of inadequate pressure/friction relief. I talked to her about making sure in the forefoot offloading boot that the weight stays on her heel in stance phase 01-31-2022 upon evaluation patient's wound is still continuing to show signs of really minimal progress unfortunately. I actually believe that she may need to go into the total contact cast in order to get this to heal and I discussed that with her today as well. Obviously this has been a little bit of a barrier for her not because she does not want to do it and does not want to get healed but rather because it is going to prevent her from being able to drive which is obviously going to be a significant quality-of-life issue with what she does in  the position she is in at work. 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 I do believe she could work with the cast on but the issue simply is that she cannot drive to get to work. Otherwise should be able to walk and do what she needs to do. 02-07-2022 upon evaluation today patient appears to be doing well with regard to her wounds. She has been tolerating the dressing changes without complication. Fortunately I do not see any signs of infection although the wound itself is not significantly smaller as of yet. This is something that we are trying to work on I do  believe a total contact cast would be the right way to go. I discussed this with the patient today as well. She is actually in agreement with giving this a trial although she is a little nervous about it being that she is claustrophobic. 6/23; patient presents for obligatory cast exchange. She reports that her heel feels sore from the total contact cast. On that she had no issues. 02-14-2022 upon evaluation today patient's wound is actually showing signs of excellent improvement. There does not appear to be any signs of active infection at this time and overall the toe looks amazing in just 1 week's time. We have come down almost half compared to where we were before size wise and this is also him as well. Overall I think that the casting has done extremely good for her and the toe in general looks significantly improved. I think this week since we want to have to mess with the wound bed itself just the callus around she will likely have even better improvement come next time. 02-21-2022 upon evaluation today patient appears to be doing well with regard to her toe. She is actually showing signs of improvement this is a little bit slower than she would like to see but nonetheless we are seeing significant changes in the right direction in my opinion. Fortunately she is also developing a lot less callus on the end of the toe which is helpful for her as well in my opinion. 02-28-2022 upon evaluation today patient appears to be doing well currently in regard to her wound. She has been tolerating the dressing changes. Fortunately I do not see any signs of infection I think the cast is doing an awesome job here. No fevers, chills, nausea, vomiting, or diarrhea. 03-07-2022 upon evaluation today patient appears to be doing well with regard to her wound although the collagen is very stuck and dry on the area on the tip of her toe at this point. Nonetheless I discussed with her that I am a little reluctant to put the  cast on this week due to what appeared to been a blister along the left side of her toe laterally. Nonetheless this has been a little worried about the possibility of infection and I do not see anything obvious I think that we may not want to put the cast on this week just to be on the safe side she voiced understanding. With that being said if were not to use the cast she is going to need to make sure to have still some pressure relief I think making gauze bolster with 3 gauze up underneath the toe is going to do a good job here. We will roll and tape this and then subsequently anchored underneath the big toe should be using the front offloading shoe as  well. 7/26; patient I know from a previous stay in this clinic. Type II diabetic with a wound on her right plantar great toe. She was in a total contact cast to 2 weeks ago the wound was close to closed last week according to our intake nurse. We put Xeroform and gauze on this that the patient has been changing her self. She has a forefoot offloading boot. 03-21-2022 upon a variable evaluation today patient appears to be doing somewhat poorly in regard to callus buildup at this time. Fortunately I do not see any evidence of overall worsening but this is just still open. No fevers, chills, nausea, vomiting, or diarrhea. I think we may have to go back into the cast next week if this is not significantly improved by that time. 03-28-2022 upon evaluation today patient's wound is actually showing signs of significant improvement which is great news. The cast over the past week he has done a great job. This is so small however but still has a little bit of a deeper area that I am concerned about the possibility of it drying out too much under the cast that is what we saw today and callused over. Subsequently I did remove the callus. 04-11-2022 upon evaluation today patient appears to still have a lot of callus buildup today. She was sick with COVID last weeks has  been 2 weeks since have seen her. This is what accounts for some of the callus buildup in my opinion. With that being said she tells me she was not nearly as active working from home with COVID as she can go in. Nonetheless I do believe still that based on what we are seeing this is still too much friction is likely occurring around the toe area. 04-18-2022 upon evaluation today patient appears to be doing excellent in regard to her wound in fact this appears to be completely healed which is great news. Fortunately I do not see any evidence of active infection locally or systemically at this time which is excellent as well. No fevers, chills, nausea, vomiting, or diarrhea. 06/29/2022 Ms. Marquita PalmsLindsey Curran is a 56 year old female with a past medical history of uncontrolled type 2 diabetes that presents to the clinic for a wound to the right foot. On 06/02/2022 she presented to the hospital for cellulitis of the right foot and found to have osteomyelitis of the fifth toe. She had a 5th ray amputation on 10/14. Her culture grew Staph aureus. She was placed on 10 days of linezolid by infectious disease. She has been doing a dry dressing for the past 3-4 weeks. She currently denies systemic signs of infection. Electronic Signature(s) Signed: 06/29/2022 10:08:27 AM By: Geralyn CorwinHoffman, Cruise Baumgardner DO Entered By: Geralyn CorwinHoffman, Issaic Welliver on 06/29/2022 09:18:21 Donna BollmanURRIN, Donna [name] ([id number]) [id number] Page 4 of 11 -------------------------------------------------------------------------------- Physical Exam Details Patient Name: Date of Service: Donna BollmanCURRIN, Donna [name]. 06/29/2022 8:00 A M Medical Record Number: [id number] Patient Account Number: [id number] Date of Birth/Sex: Treating RN: 04/26/1966 (56 y.o. F) Primary Care Provider: Hillard Dankerrawford, Elizabeth Other Clinician: Referring Provider: Treating Provider/Extender: Leandrew KoyanagiHoffman, Kobi Aller Crawford, Elizabeth Weeks in Treatment:  0 Constitutional respirations regular, non-labored and within target range for patient.. Cardiovascular 2+ dorsalis pedis/posterior tibialis pulses. Psychiatric pleasant and cooperative. Notes Right foot: T the lateral aspect she has a surgical wound status post fifth ray amputation. The wound bed has nonviable tissue and scant granulation tissue. o Slight erythema to the periwound. Electronic Signature(s) Signed: 06/29/2022 10:08:27 AM By: Geralyn CorwinHoffman, Aristotelis Vilardi DO Entered By: Geralyn CorwinHoffman, Jarrett Chicoine on 06/29/2022  09:19:20 -------------------------------------------------------------------------------- Physician Orders Details Patient Name: Date of Service: LEANDRA, VANDERWEELE 06/29/2022 8:00 A M Medical Record Number: [id number] Patient Account Number: [id number] Date of Birth/Sex: Treating RN: 1965/09/11 (56 y.o. [name] Primary Care Provider: Hillard Danker Other Clinician: Referring Provider: Treating Provider/Extender: Leandrew Koyanagi in Treatment: 0 Verbal / Phone Orders: No Diagnosis Coding Follow-up Appointments ppointment in 1 week. - Friday or the following Monday with Dr. Mikey Bussing. Return A Other: - Pick up oral antibiotics from your pharmacy. [facility] to send you santyl. Anesthetic (In clinic) Topical Lidocaine 4% applied to wound bed Bathing/ Shower/ Hygiene May shower with protection but do not get wound dressing(s) wet. Edema Control - Lymphedema / SCD / Other Elevate legs to the level of the heart or above for 30 minutes daily and/or when sitting, a frequency of: - throughout the day. Avoid standing for long periods of time. Off-Loading Open toe surgical shoe to: - office to provide a new surgical shoe. ensure no pressure to right lateral foot. Wound Treatment Wound #3 - Amputation Site - Toe Wound Laterality: Right Cleanser: Vashe 1 x Per Day/30 Days Discharge Instructions: use to cleanse wound daily. Prim  Dressing: KerraCel Ag Gelling Fiber Dressing, 4x5 in (silver alginate) (DME) (Generic) 1 x Per Day/30 Days ary Discharge Instructions: Apply silver alginate over the santyl. Prim Dressing: Santyl Ointment 1 x Per Day/30 Days ary Discharge Instructions: Apply in office today and use once it arrives at home instead of medihoney. SOMARA, FRYMIRE ([id number]) [id number] Page 5 of 11 Prim Dressing: MediHoney Gel, tube 1.5 (oz) 1 x Per Day/30 Days ary Discharge Instructions: Use this daily until santyl arrives. Office to provide the medihoney. Secondary Dressing: Woven Gauze Sponge, Non-Sterile 4x4 in (DME) (Generic) 1 x Per Day/30 Days Discharge Instructions: Apply over primary dressing as directed. Secured With: Insurance underwriter, Sterile 2x75 (in/in) (DME) (Generic) 1 x Per Day/30 Days Discharge Instructions: Secure with stretch gauze as directed. Secured With: 58M Medipore H Soft Cloth Surgical T ape, 4 x 10 (in/yd) (DME) (Generic) 1 x Per Day/30 Days Discharge Instructions: Secure with tape as directed. Patient Medications llergies: No Known Drug Allergies A Notifications Medication Indication Start End 06/29/2022 lidocaine DOSE topical 4 % cream - cream topical applied only in clinic for debridements by provider. 06/29/2022 doxycycline hyclate DOSE 1 - oral 100 mg tablet - 1 tablet oral BID x 14 days Santyl DOSE 1 - topical 250 unit/gram ointment - Apply daily to the wound bed Electronic Signature(s) Signed: 06/29/2022 10:08:27 AM By: Geralyn Corwin DO Previous Signature: 06/29/2022 9:26:29 AM Version By: Geralyn Corwin DO Previous Signature: 06/29/2022 9:24:05 AM Version By: Geralyn Corwin DO Entered By: Geralyn Corwin on 06/29/2022 09:26:39 -------------------------------------------------------------------------------- Problem List Details Patient Name: Date of Service: Donna [name]. 06/29/2022 8:00 A M Medical Record  Number: [id number] Patient Account Number: [id number] Date of Birth/Sex: Treating RN: 07/27/1966 (56 y.o. F) Primary Care Provider: Hillard Danker Other Clinician: Referring Provider: Treating Provider/Extender: Leandrew Koyanagi in Treatment: 0 Active Problems ICD-10 Encounter Code Description Active Date MDM Diagnosis S91.301A Unspecified open wound, right foot, initial encounter 06/29/2022 No Yes E11.621 Type 2 diabetes mellitus with foot ulcer 06/29/2022 No Yes I96 Gangrene, not elsewhere classified 06/29/2022 No Yes M86.9 Osteomyelitis, unspecified 06/29/2022 No Yes Inactive Problems Resolved Problems LAKIYAH, ARNTSON ([id number]) [id number] Page 6 of 11 Electronic Signature(s) Signed: 06/29/2022 10:08:27 AM By: Geralyn Corwin DO Entered By: Geralyn Corwin on 06/29/2022 09:14:33 --------------------------------------------------------------------------------  Progress Note Details Patient Name: Date of Service: Donna [name], Donna [name] 06/29/2022 8:00 A M Medical Record Number: [id number] Patient Account Number: [id number] Date of Birth/Sex: Treating RN: [birth year] (56 y.o. 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 She tells me that since at least last summer she has had a progressive callused area that has opened closed but is never really healed. She has a history of calluses on her feet. She is a diabetic with neuropathy. She was referred to Dr. Allena Katz at [facility] who felt that this might be a large  plantar wart she was treated with laser for 2 treatments and then subsequently with ammonium lactate none of this is apparently made any difference according to the patient. She has a very odd looking area with thick skin and callus and an almost crossed like area of open tissue. She also has erythema above this which she says is chronic. The other interesting part of her recent history is recurrent cellulitis on the right lower leg although this is separated from the area we are looking at. She has had trips to see infectious disease has been on Keflex for as long as 20 days. This is presumed to be strep. She saw Dr. Renold Don Past medical history includes type 2 diabetes, recurrent cellulitis of the right leg, right Achilles tendon repair a year ago for spontaneous rupture ABI in our clinic was 1.09 on the right 2/17; patient admitted the clinic last week had a thick callused area on the right lateral heel she is a diabetic with neuropathy. She had thick callus and across like area of open wound. I aggressively debrided this area to remove most of the callus and gave her Hydrofera Blue and heel off loader, heel cup and she has been wearing this religiously trying to keep the pressure off the area. She comes in today with a cross-like area of injury has healed she has a new open area that I do not remember from last time. Still some dry flaking skin and callus but not nearly as much as last week at which time I had to use a scalpel to remove this. 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 She came in 2 weeks ago with a very thick hyperkeratotic callused area on her right lateral heel. There was a "crossed" shaped area of split tissue with an open wound at the bottom. This had previously been treated as a possible wart without improvement. I removed all of the callus when she first came in here. This is mostly epithelialized over still thick skin but nothing like it was. She has 1  small open area that has not healed she has been more religious about offloading the area 10/27/2020 upon evaluation today patient appears to be doing well with regard to her wound although she does still apparently appear to have something still open in the base of the wound. Fortunately there is no evidence of infection at this time which is great news and in general I feel like she is doing decently well though she has a lot of callus buildup she is using the offloading shoe for her heel. At  home she does not always wear this she tells me. Generally she is using flip-flops there. Of note her primary care provider did give her Keflex yesterday for cellulitis of this leg but has nothing to do with the heel location. 3/17; the patient's area actually looks a lot better. There is no open wound here. Much less callus. She was given a prescription for 40% urea cream I am not sure that really is required this week. She is going to require less friction on this area. I think she is going to need a different type of shoe going forward Readmission: 12-13-2028 upon evaluation today patient presents for reevaluation in the clinic she was last seen by Dr. Leanord Hawking on 11-03-2020 when she was discharged this was due to a crack on her heel. Currently she is actually having an issue with her great toe on the right foot. She tells me that this has been present currently for a couple of weeks since around November 22, 2021. She has had a lot of callus over this area in general but not an open wound until that time. Subsequently this has more recently opened and has Become a greater concern she is also noted some odor which is disconcerting to her. She tells me that her greatest concern is that "she does not lose this toe". Again I completely understand her concern in this regard I definitely think we need to be aggressive in treating this and try to get things better. She is very Adult nurse. Patient does have a history of  diabetes mellitus type 2 with diabetic neuropathy. She also tells me that she does have a history as well of hypertension which is pretty well controlled. She has not yet had an x-ray of the area and has not been on any antibiotics. Both are things I think we will need to do today. 12-20-2021 upon evaluation today patient appears to be doing well with regard to her toe ulcer all things considered. She has been tolerating the dressing changes without complication. I do have her x-ray as well as her culture for review today the culture did so Staphylococcus aureus no MRSA noted which was great news susceptible to doxycycline which is what I did place her on last week. Overall the odor that she was noticing has also cleared which is great news. With regard to the x-ray there does not appear to be any signs of osteomyelitis or bone erosion at this point which is also great news in general and very pleased with where things stand. 12-27-2021 upon evaluation today patient appears to be doing well with regard to her wound this is actually measuring significantly better which is great news. Overall I am extremely pleased with where we stand today. No fevers, chills, nausea, vomiting, or diarrhea. 01-03-2022 upon evaluation today patient's wound is actually showing signs of significant improvement which is also news. I am very pleased with what we are seeing there is a little bit of callus buildup still but we are going to work on that today otherwise she seems to be making excellent progress which is great news. 01-10-2022 upon evaluation today patient appears to be doing well currently in regard to her toe ulcer. She has been tolerating the dressing changes without complication. Fortunately there does not appear to be any evidence of active infection locally or systemically which is great news. No fevers, chills, nausea, Donna [name], Donna [name] ([id number]) [id number] Page 7 of 11 vomiting,  or diarrhea. 01-17-2022 upon evaluation today patient's  toe was actually doing about the same this week there is not a tremendous improvement in size but overall the appearance of the wound is better it is much flatter I do think the [facility] has done well in that regard. We will still wait to hear back on the skin substitute. With that being said I do believe that the patient could benefit from again the skin substitute to try to help get this to heal much more effectively and quickly in the meantime I am trying to help flatten this out which I think is doing a pretty good job.. 6/7; wound on the plantar right great toe over the distal phalanx. Wound is slightly smaller but looks healthier there is rims of epithelialization she still has some callus on the toe indicative of inadequate pressure/friction relief. I talked to her about making sure in the forefoot offloading boot that the weight stays on her heel in stance phase 01-31-2022 upon evaluation patient's wound is still continuing to show signs of really minimal progress unfortunately. I actually believe that she may need to go into the total contact cast in order to get this to heal and I discussed that with her today as well. Obviously this has been a little bit of a barrier for her not because she does not want to do it and does not want to get healed but rather because it is going to prevent her from being able to drive which is obviously going to be a significant quality-of-life issue with what she does in the position she is in at work. 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 I do believe she could work with the cast on but the issue simply is that she cannot drive to get to work. Otherwise should be able to walk and do what she needs to do. 02-07-2022 upon evaluation today patient appears to be doing well with regard to her wounds. She has been tolerating the dressing  changes without complication. Fortunately I do not see any signs of infection although the wound itself is not significantly smaller as of yet. This is something that we are trying to work on I do believe a total contact cast would be the right way to go. I discussed this with the patient today as well. She is actually in agreement with giving this a trial although she is a little nervous about it being that she is claustrophobic. 6/23; patient presents for obligatory cast exchange. She reports that her heel feels sore from the total contact cast. On that she had no issues. 02-14-2022 upon evaluation today patient's wound is actually showing signs of excellent improvement. There does not appear to be any signs of active infection at this time and overall the toe looks amazing in just 1 week's time. We have come down almost half compared to where we were before size wise and this is also him as well. Overall I think that the casting has done extremely good for her and the toe in general looks significantly improved. I think this week since we want to have to mess with the wound bed itself just the callus around she will likely have even better improvement come next time. 02-21-2022 upon evaluation today patient appears to be doing well with regard to her toe. She is actually showing signs of improvement this is a little bit slower than she would like to see but nonetheless we  are seeing significant changes in the right direction in my opinion. Fortunately she is also developing a lot less callus on the end of the toe which is helpful for her as well in my opinion. 02-28-2022 upon evaluation today patient appears to be doing well currently in regard to her wound. She has been tolerating the dressing changes. Fortunately I do not see any signs of infection I think the cast is doing an awesome job here. No fevers, chills, nausea, vomiting, or diarrhea. 03-07-2022 upon evaluation today patient appears to be  doing well with regard to her wound although the collagen is very stuck and dry on the area on the tip of her toe at this point. Nonetheless I discussed with her that I am a little reluctant to put the cast on this week due to what appeared to been a blister along the left side of her toe laterally. Nonetheless this has been a little worried about the possibility of infection and I do not see anything obvious I think that we may not want to put the cast on this week just to be on the safe side she voiced understanding. With that being said if were not to use the cast she is going to need to make sure to have still some pressure relief I think making gauze bolster with 3 gauze up underneath the toe is going to do a good job here. We will roll and tape this and then subsequently anchored underneath the big toe should be using the front offloading shoe as well. 7/26; patient I know from a previous stay in this clinic. Type II diabetic with a wound on her right plantar great toe. She was in a total contact cast to 2 weeks ago the wound was close to closed last week according to our intake nurse. We put Xeroform and gauze on this that the patient has been changing her self. She has a forefoot offloading boot. 03-21-2022 upon a variable evaluation today patient appears to be doing somewhat poorly in regard to callus buildup at this time. Fortunately I do not see any evidence of overall worsening but this is just still open. No fevers, chills, nausea, vomiting, or diarrhea. I think we may have to go back into the cast next week if this is not significantly improved by that time. 03-28-2022 upon evaluation today patient's wound is actually showing signs of significant improvement which is great news. The cast over the past week he has done a great job. This is so small however but still has a little bit of a deeper area that I am concerned about the possibility of it drying out too much under the cast that is what  we saw today and callused over. Subsequently I did remove the callus. 04-11-2022 upon evaluation today patient appears to still have a lot of callus buildup today. She was sick with COVID last weeks has been 2 weeks since have seen her. This is what accounts for some of the callus buildup in my opinion. With that being said she tells me she was not nearly as active working from home with COVID as she can go in. Nonetheless I do believe still that based on what we are seeing this is still too much friction is likely occurring around the toe area. 04-18-2022 upon evaluation today patient appears to be doing excellent in regard to her wound in fact this appears to be completely healed which is great news. Fortunately I do not see any  evidence of active infection locally or systemically at this time which is excellent as well. No fevers, chills, nausea, vomiting, or diarrhea. 06/29/2022 Ms. Marquita [name] is a 56 year old female with a past medical history of uncontrolled type 2 diabetes that presents to the clinic for a wound to the right foot. On 06/02/2022 she presented to the hospital for cellulitis of the right foot and found to have osteomyelitis of the fifth toe. She had a 5th ray amputation on 10/14. Her culture grew Staph aureus. She was placed on 10 days of linezolid by infectious disease. She has been doing a dry dressing for the past 3-4 weeks. She currently denies systemic signs of infection. Patient History Information obtained from Patient. Allergies No Known Drug Allergies Family History Cancer - Father, Diabetes - Mother, Heart Disease - Father,Mother. Social History Never smoker, Marital Status - Single, Alcohol Use - Never, Drug Use - No History, Caffeine Use - Daily. 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 Medical A Surgical History Notes nd Cardiovascular Hyperlipidemia Objective Constitutional respirations regular, non-labored and within target range for patient.. Vitals Time Taken: 8:14 AM, Temperature: 98.7 F, Pulse: 67 bpm, Respiratory Rate: 17 breaths/min, Blood Pressure: 155/83 mmHg, Capillary Blood Glucose: 150 mg/dl. Cardiovascular 2+ dorsalis pedis/posterior tibialis pulses. Psychiatric pleasant and cooperative. General Notes: Right foot: T the lateral aspect she has a surgical wound status post fifth ray amputation. The wound bed has nonviable tissue and scant o granulation tissue. Slight erythema to the periwound. Integumentary (Hair, Skin) Wound #3 status is Open. Original cause of wound was Surgical Injury. The date acquired was: 05/21/2022. The wound is located on the Right Amputation Site - T The wound measures 4.8cm length x 1.3cm width x 0.9cm depth; 4.901cm^2 area and 4.411cm^3 volume. There is Fat Layer (Subcutaneous Tissue) oe. exposed. There is no tunneling or undermining noted. There is a medium amount of serosanguineous drainage noted. The wound margin is distinct with the outline attached to the wound base. There is small (1-33%) red, pink granulation within the wound bed. There is a large (67-100%) amount of necrotic tissue within the wound bed including Eschar and Adherent Slough. The periwound skin appearance exhibited: Callus. The periwound skin appearance did not exhibit: Crepitus, Excoriation, Induration, Rash, Scarring, Dry/Scaly, Maceration, Atrophie Blanche, Cyanosis, Ecchymosis, Hemosiderin Staining, Mottled, Pallor, Rubor, Erythema. Periwound temperature was noted as No Abnormality. Assessment Active Problems ICD-10 Unspecified open wound,  right foot, initial encounter Type 2 diabetes mellitus with foot ulcer Gangrene, not elsewhere classified Osteomyelitis, unspecified Patient presents with a 3 to 4-week history of nonhealing wound to the right foot status post fifth ray amputation. She states that her sutures were removed yesterday. She has been doing a dry dressing. Her bone culture grew Staph aureus sensitive to doxycycline. At this time I debrided nonviable tissue and recommended using Santyl and silver alginate daily to the wound bed. I recommended using a wound cleanser daily as well prior to dressing changes. I do not recommend she expose this in the shower. She has a cast protector bag. I recommended surgical shoe for offloading. She has slight erythema to the periwound and she is at high risk for further limb loss. I recommended doxycycline. Follow-up in 1 week. If she  is unable to obtain Santyl I recommended Medihoney and this was given to her in office today. Procedures Wound #3 Pre-procedure diagnosis of Wound #3 is a Diabetic Wound/Ulcer of the Lower Extremity located on the Right Amputation Site - T .Severity of Tissue Pre oe Debridement is: Fat layer exposed. There was a Excisional Skin/Subcutaneous Tissue Debridement with a total area of 6.24 sq cm performed by Geralyn Corwin, DO. With the following instrument(s): Blade, Curette, and Forceps to remove Viable and Non-Viable tissue/material. Material removed includes Eschar, Subcutaneous Tissue, and Slough after achieving pain control using Lidocaine 4% T opical Solution. A time out was conducted at 09:00, prior to the start of the procedure. A Minimum amount of bleeding was controlled with Pressure. The procedure was tolerated well with a pain level of 0 throughout and a pain level of 0 following the procedure. Post Debridement Measurements: 4.8cm length x 1.3cm width x 0.9cm depth; 4.411cm^3 volume. Character of Wound/Ulcer Post Debridement requires further  debridement. Severity of Tissue Post Debridement is: Fat layer exposed. Post procedure Diagnosis Wound #3: Same as Pre-Procedure General Notes: documented by Shawn Stall, RN.Donna [name], Clide [place] ([id number]) [id number] Page 9 of 11 Plan Follow-up Appointments: Return Appointment in 1 week. - Friday or the following Monday with Dr. Mikey Bussing. Other: - Pick up oral antibiotics from your pharmacy. [facility] to send you santyl. Anesthetic: (In clinic) Topical Lidocaine 4% applied to wound bed Bathing/ Shower/ Hygiene: May shower with protection but do not get wound dressing(s) wet. Edema Control - Lymphedema / SCD / Other: Elevate legs to the level of the heart or above for 30 minutes daily and/or when sitting, a frequency of: - throughout the day. Avoid standing for long periods of time. Off-Loading: Open toe surgical shoe to: - office to provide a new surgical shoe. ensure no pressure to right lateral foot. The following medication(s) was prescribed: lidocaine topical 4 % cream cream topical applied only in clinic for debridements by provider. was prescribed at facility doxycycline hyclate oral 100 mg tablet 1 1 tablet oral BID x 14 days starting 06/29/2022 Santyl topical 250 unit/gram ointment 1 Apply daily to the wound bed WOUND #3: - Amputation Site - T oe Wound Laterality: Right Cleanser: Vashe 1 x Per Day/30 Days Discharge Instructions: use to cleanse wound daily. Prim Dressing: KerraCel Ag Gelling Fiber Dressing, 4x5 in (silver alginate) (DME) (Generic) 1 x Per Day/30 Days ary Discharge Instructions: Apply silver alginate over the santyl. Prim Dressing: Santyl Ointment 1 x Per Day/30 Days ary Discharge Instructions: Apply in office today and use once it arrives at home instead of medihoney. Prim Dressing: MediHoney Gel, tube 1.5 (oz) 1 x Per Day/30 Days ary Discharge Instructions: Use this daily until santyl arrives. Office to provide  the medihoney. Secondary Dressing: Woven Gauze Sponge, Non-Sterile 4x4 in (DME) (Generic) 1 x Per Day/30 Days Discharge Instructions: Apply over primary dressing as directed. Secured With: Insurance underwriter, Sterile 2x75 (in/in) (DME) (Generic) 1 x Per Day/30 Days Discharge Instructions: Secure with stretch gauze as directed. Secured With: 73M Medipore H Soft Cloth Surgical T ape, 4 x 10 (in/yd) (DME) (Generic) 1 x Per Day/30 Days Discharge Instructions: Secure with tape as directed. 1. In office sharp debridement 2. Santyl and silver alginate 3. Wound cleanserooVashe 4. Offloadingoosurgical shoe 5. Doxycycline Electronic Signature(s) Signed: 06/29/2022 10:08:27 AM By: Geralyn Corwin DO Entered By: Geralyn Corwin on 06/29/2022 09:27:11 -------------------------------------------------------------------------------- HxROS Details Patient Name: Date of Service: Donna Fleet [name]. 06/29/2022 8:00 A  M Medical Record Number: [id number] Patient Account Number: [id number] Date of Birth/Sex: Treating RN: 1965-10-04 (57 y.o. [name] Primary Care Provider: Hillard Danker Other Clinician: Referring Provider: Treating Provider/Extender: Leandrew Koyanagi in Treatment: 0 Information Obtained From Patient Cardiovascular Medical History: Positive for: Hypertension Past Medical History Notes: Hyperlipidemia Endocrine Medical History: Positive for: Type II Diabetes Treated with: Insulin, Oral agents Blood sugar tested every day: No Donna [name], Donna [name] ([id number]) [id number] Page 10 of 11 Neurologic Medical History: Positive for: Neuropathy Immunizations Pneumococcal Vaccine: Received Pneumococcal Vaccination: No Implantable Devices None Hospitalization / Surgery History Type of Hospitalization/Surgery right exicision haglund's deformity wih achilles tendon repair 2020 right 5th ray amputation  06/02/2022 osteo with gangrene. Family and Social History Cancer: Yes - Father; Diabetes: Yes - Mother; Heart Disease: Yes - Father,Mother; Never smoker; Marital Status - Single; Alcohol Use: Never; Drug Use: No History; Caffeine Use: Daily; Financial Concerns: No; Food, Clothing or Shelter Needs: No; Support System Lacking: No; Transportation Concerns: No Electronic Signature(s) Signed: 06/29/2022 10:08:27 AM By: Geralyn Corwin DO Signed: 06/29/2022 2:17:30 PM By: Shawn Stall RN, BSN Entered By: Shawn Stall on 06/28/2022 14:48:28 -------------------------------------------------------------------------------- SuperBill Details Patient Name: Date of Service: Donna [name] 06/29/2022 Medical Record Number: [id number] Patient Account Number: [id number] Date of Birth/Sex: Treating RN: May 21, 1966 (57 y.o. F) Primary Care Provider: Hillard Danker Other Clinician: Referring Provider: Treating Provider/Extender: Leandrew Koyanagi in Treatment: 0 Diagnosis Coding ICD-10 Codes Code Description S91.301A Unspecified open wound, right foot, initial encounter E11.621 Type 2 diabetes mellitus with foot ulcer I96 Gangrene, not elsewhere classified M86.9 Osteomyelitis, unspecified Facility Procedures : CPT4 Code: 62952841 Description: 99213 - WOUND CARE VISIT-LEV 3 EST PT Modifier: Quantity: 1 : CPT4 Code: 32440102 Description: 11042 - DEB SUBQ TISSUE 20 SQ CM/< ICD-10 Diagnosis Description E11.621 Type 2 diabetes mellitus with foot ulcer S91.301A Unspecified open wound, right foot, initial encounter Modifier: Quantity: 1 Physician Procedures : CPT4 Code Description Modifier [id number] 99214 - WC PHYS LEVEL 4 - EST PT ICD-10 Diagnosis Description S91.301A Unspecified open wound, right foot, initial encounter E11.621 Type 2 diabetes mellitus with foot ulcer I96 Gangrene, not elsewhere classified  TERRALYN, MATSUMURA [name] ([id number])  [id number] M86.9 Osteomyelitis, unspecified Quantity: 1 .pdf Page 11 of 11 : [id number] 11042 - WC PHYS SUBQ TISS 20 SQ CM 1 ICD-10 Diagnosis Description E11.621 Type 2 diabetes mellitus with foot ulcer S91.301A Unspecified open wound, right foot, initial encounter Quantity: Electronic Signature(s) Signed: 06/29/2022 1:15:41 PM By: Geralyn Corwin DO Signed: 06/29/2022 2:17:30 PM By: Shawn Stall RN, BSN Previous Signature: 06/29/2022 10:08:27 AM Version By: Geralyn Corwin DO Entered By: Shawn Stall on 06/29/2022 10:17:13

## 2022-07-02 DIAGNOSIS — S91101A Unspecified open wound of right great toe without damage to nail, initial encounter: Secondary | ICD-10-CM | POA: Diagnosis not present

## 2022-07-02 DIAGNOSIS — S91309A Unspecified open wound, unspecified foot, initial encounter: Secondary | ICD-10-CM | POA: Diagnosis not present

## 2022-07-02 NOTE — Progress Notes (Signed)
Donna [name], Donna [name] ([id number]) [id number] Page 1 of 4 Visit Report for 06/29/2022 Abuse Risk Screen Details Patient Name: Date of Service: Donna [name], Donna [name] 06/29/2022 8:00 A M Medical Record Number: [id number] Patient Account Number: [id number] Date of Birth/Sex: Treating RN: 08/22/1965 (56 y.o. Ardis Rowan, [name] Primary Care Ziza Hastings: Hillard Danker Other Clinician: Referring Somya Jauregui: Treating Arlethia Basso/Extender: Leandrew Koyanagi in Treatment: 0 Abuse Risk Screen Items Answer ABUSE RISK SCREEN: Has anyone close to you tried to hurt or harm you recentlyo No Do you feel uncomfortable with anyone in your familyo No Has anyone forced you do things that you didnt want to doo No Electronic Signature(s) Signed: 07/02/2022 4:10:29 PM By: Fonnie Mu RN Entered By: Fonnie Mu on 06/29/2022 08:15:05 -------------------------------------------------------------------------------- Activities of Daily Living Details Patient Name: Date of Service: Donna [name], Donna [name] 06/29/2022 8:00 A M Medical Record Number: [id number] Patient Account Number: [id number] Date of Birth/Sex: Treating RN: Jul 06, 1966 (56 y.o. Ardis Rowan, [name] Primary Care Blannie Shedlock: Hillard Danker Other Clinician: Referring Rakesh Dutko: Treating Jasmon Mattice/Extender: Leandrew Koyanagi in Treatment: 0 Activities of Daily Living Items Answer Activities of Daily Living (Please select one for each item) Drive Automobile Completely Able T Medications ake Completely Able Use T elephone Completely Able Care for Appearance Completely Able Use T oilet Completely Able Bath / Shower Completely Able Dress Self Completely Able Feed Self Completely Able Walk Completely Able Get In / Out Bed Completely Able Housework Completely Able Prepare Meals Completely Able Handle Money Completely Able Shop for Self Completely  Able Electronic Signature(s) Signed: 07/02/2022 4:10:29 PM By: Fonnie Mu RN Entered By: Fonnie Mu on 06/29/2022 08:15:21 Cleora Fleet [name] ([id number]) [id number] Page 2 of 4 -------------------------------------------------------------------------------- Education Screening Details Patient Name: Date of Service: Donna [name], Donna [name] 06/29/2022 8:00 A M Medical Record Number: [id number] Patient Account Number: [id number] Date of Birth/Sex: Treating RN: May 17, 1966 (56 y.o. Ardis Rowan, [name] Primary Care Gyasi Hazzard: Hillard Danker Other Clinician: Referring Lonnetta Kniskern: Treating Xolani Degracia/Extender: Leandrew Koyanagi in Treatment: 0 Primary Learner Assessed: Patient Learning Preferences/Education Level/Primary Language Learning Preference: Explanation, Demonstration, Communication Board, Printed Material Highest Education Level: College or Above Preferred Language: English Cognitive Barrier Language Barrier: No Translator Needed: No Memory Deficit: No Emotional Barrier: No Cultural/Religious Beliefs Affecting Medical Care: No Physical Barrier Impaired Vision: No Impaired Hearing: No Decreased Hand dexterity: No Knowledge/Comprehension Knowledge Level: High Comprehension Level: High Ability to understand written instructions: High Ability to understand verbal instructions: High Motivation Anxiety Level: Calm Cooperation: Cooperative Education Importance: Denies Need Interest in Health Problems: Asks Questions Perception: Coherent Willingness to Engage in Self-Management High Activities: Readiness to Engage in Self-Management High Activities: Electronic Signature(s) Signed: 07/02/2022 4:10:29 PM By: Fonnie Mu RN Entered By: Fonnie Mu on 06/29/2022 08:16:27 -------------------------------------------------------------------------------- Fall Risk Assessment Details Patient Name: Date  of Service: Donna Bollman. 06/29/2022 8:00 A M Medical Record Number: [id number] Patient Account Number: [id number] Date of Birth/Sex: Treating RN: Oct 13, 1965 (56 y.o. Ardis Rowan, [name] Primary Care Cleda Imel: Hillard Danker Other Clinician: Referring Crystalee Ventress: Treating Leela Vanbrocklin/Extender: Leandrew Koyanagi in Treatment: 0 Fall Risk Assessment Items Have you had 2 or more falls in the last 12 monthso 0 No Ferrick, Ladoris [name] ([id number]) [phone] [id number] Page 3 of 4 Have you had any fall that resulted in injury in the last 12 monthso 0 No FALLS RISK SCREEN History of falling - immediate or within 3 months 0 No Secondary diagnosis (Do you have 2 or more medical diagnoseso) 0 No Ambulatory aid None/bed  rest/wheelchair/nurse 0 No Crutches/cane/walker 0 No Furniture 0 No Intravenous therapy Access/Saline/Heparin Lock 0 No Gait/Transferring Normal/ bed rest/ wheelchair 0 No Weak (short steps with or without shuffle, stooped but able to lift head while walking, may seek 0 No support from furniture) Impaired (short steps with shuffle, may have difficulty arising from chair, head down, impaired 0 No balance) Mental Status Oriented to own ability 0 No Electronic Signature(s) Signed: 07/02/2022 4:10:29 PM By: [name] RN Entered By: [name] on 06/29/2022 08:17:06 -------------------------------------------------------------------------------- Foot Assessment Details Patient Name: Date of Service: Donna Hamming. 06/29/2022 8:00 A M Medical Record Number: [id number] Patient Account Number: [id number] Date of Birth/Sex: Treating RN: 1965/10/28 (56 y.o. [name], [name] Primary Care Elazar Argabright: Pricilla Holm Other Clinician: Referring Tru Leopard: Treating Jeris Roser/Extender: Verlene Mayer in Treatment: 0 Foot Assessment Items Site Locations + = Sensation present, - =  Sensation absent, C = Callus, U = Ulcer [name] = Redness, W = Warmth, M = Maceration, PU = Pre-ulcerative lesion F = Fissure, S = Swelling, D = Dryness Assessment Right: Left: Other Deformity: No No Prior Foot Ulcer: No No Prior Amputation: No No Charcot Joint: No No Ambulatory Status: Ambulatory Without Help GaitSEYLAH, Donna [name] ([id number]) [phone] [id number] Page 4 of 4 Electronic Signature(s) Signed: 07/02/2022 4:10:29 PM By: [name] RN Entered By: [name] on 06/29/2022 08:18:06 -------------------------------------------------------------------------------- Nutrition Risk Screening Details Patient Name: Date of Service: Donna [name], Donna [name] 06/29/2022 8:00 A M Medical Record Number: [id number] Patient Account Number: [id number] Date of Birth/Sex: Treating RN: 06/02/1966 (56 y.o. [name], [name] Primary Care Deidrick Rainey: Pricilla Holm Other Clinician: Referring Brixton Franko: Treating Laelle Bridgett/Extender: Verlene Mayer in Treatment: 0 Height (in): Weight (lbs): Body Mass Index (BMI): Nutrition Risk Screening Items Score Screening NUTRITION RISK SCREEN: I have an illness or condition that made me change the kind and/or amount of food I eat 0 No I eat fewer than two meals per day 0 No I eat few fruits and vegetables, or milk products 0 No I have three or more drinks of beer, liquor or wine almost every day 0 No I have tooth or mouth problems that make it hard for me to eat 0 No I don't always have enough money to buy the food I need 0 No I eat alone most of the time 0 No I take three or more different prescribed or over-the-counter drugs a day 0 No Without wanting to, I have lost or gained 10 pounds in the last six months 0 No I am not always physically able to shop, cook and/or feed myself 0 No Nutrition Protocols Good Risk Protocol 0 No interventions needed Moderate Risk Protocol High Risk  Proctocol Risk Level: Good Risk Score: 0 Electronic Signature(s) Signed: 07/02/2022 4:10:29 PM By: [name] RN Entered By: [name] on 06/29/2022 08:17:12

## 2022-07-02 NOTE — Progress Notes (Signed)
ALVARETTA, Donna [name] ([id number]) [id number] Page 1 of 10 Visit Report for 06/29/2022 Allergy List Details Patient Name: Date of Service: Donna [name], Donna [name] 06/29/2022 8:00 A M Medical Record Number: [id number] Patient Account Number: [id number] Date of Birth/Sex: Treating RN: 11-04-1965 (56 y.o. [name] Primary Care Areliz Rothman: Hillard Danker Other Clinician: Referring Hildur Bayer: Treating Lola Lofaro/Extender: Leandrew Koyanagi in Treatment: 0 Allergies Active Allergies No Known Drug Allergies Allergy Notes Electronic Signature(s) Signed: 06/29/2022 2:17:30 PM By: Shawn Stall RN, BSN Entered By: Shawn Stall on 06/28/2022 14:42:31 -------------------------------------------------------------------------------- Arrival Information Details Patient Name: Date of Service: Donna [name]. 06/29/2022 8:00 A M Medical Record Number: [id number] Patient Account Number: [id number] Date of Birth/Sex: Treating RN: Jun 16, 1966 (56 y.o. Ardis Rowan, [name] Primary Care Bronc Brosseau: Hillard Danker Other Clinician: Referring Dragan Tamburrino: Treating Whittley Carandang/Extender: Leandrew Koyanagi in Treatment: 0 Visit Information Patient Arrived: Ambulatory Arrival Time: 08:13 Accompanied By: self Transfer Assistance: None Patient Identification Verified: Yes Secondary Verification Process Completed: Yes Patient Requires Transmission-Based Precautions: No Patient Has Alerts: Yes Patient Alerts: ABI's: R:1.16 11/2021 History Since Last Visit Added or deleted any medications: No Any new allergies or adverse reactions: No Had a fall or experienced change in activities of daily living that may affect risk of falls: No Signs or symptoms of abuse/neglect since last visito No Hospitalized since last visit: No Implantable device outside of the clinic excluding cellular tissue based products placed in the center since last  visit: No Has Dressing in Place as Prescribed: Yes Electronic Signature(s) Signed: 06/29/2022 2:17:30 PM By: Shawn Stall RN, BSN Entered By: Shawn Stall on 06/29/2022 09:05:11 Donna [name] ([id number]) [id number] Page 2 of 10 -------------------------------------------------------------------------------- Clinic Level of Care Assessment Details Patient Name: Date of Service: Donna, [name] 06/29/2022 8:00 A M Medical Record Number: [id number] Patient Account Number: [id number] Date of Birth/Sex: Treating RN: [birth year] (56 y.o. [name] Primary Care Nicoli Nardozzi: Hillard Danker Other Clinician: Referring Uno Esau: Treating Siler Mavis/Extender: Leandrew Koyanagi in Treatment: 0 Clinic Level of Care Assessment Items TOOL 1 Quantity Score X- 1 0 Use when EandM and Procedure is performed on INITIAL visit ASSESSMENTS - Nursing Assessment / Reassessment X- 1 20 General Physical Exam (combine w/ comprehensive assessment (listed just below) when performed on new pt. evals) X- 1 25 Comprehensive Assessment (HX, ROS, Risk Assessments, Wounds Hx, etc.) ASSESSMENTS - Wound and Skin Assessment / Reassessment X- 1 10 Dermatologic / Skin Assessment (not related to wound area) ASSESSMENTS - Ostomy and/or Continence Assessment and Care  - 0 Incontinence Assessment and Management  - 0 Ostomy Care Assessment and Management (repouching, etc.) PROCESS - Coordination of Care  - 0 Simple Patient / Family Education for ongoing care X- 1 20 Complex (extensive) Patient / Family Education for ongoing care X- 1 10 Staff obtains Chiropractor, Records, T Results / Process Orders est  - 0 Staff telephones HHA, Nursing Homes / Clarify orders / etc  - 0 Routine Transfer to another Facility (non-emergent condition)  - 0 Routine Hospital Admission (non-emergent condition) X- 1 15 New Admissions / Manufacturing engineer /  Ordering NPWT Apligraf, etc. ,  - 0 Emergency Hospital Admission (emergent condition) PROCESS - Special Needs  - 0 Pediatric / Minor Patient Management  - 0 Isolation Patient Management  - 0 Hearing / Language / Visual special needs  - 0 Assessment of Community assistance (transportation, D/C planning, etc.)  - 0 Additional assistance / Altered mentation  - 0 Support Surface(s) Assessment (bed, cushion, seat,  etc.) INTERVENTIONS - Miscellaneous []  - 0 External ear exam []  - 0 Patient Transfer (multiple staff / / Similar devices) []  - 0 Simple Staple / Suture removal (25 or less) []  - 0 Complex Staple / Suture removal (26 or more) []  - 0 Hypo/Hyperglycemic Management (do not check if billed separately) []  - 0 Ankle / Brachial Index (ABI) - do not check if billed separately Has the patient been seen at the hospital within the last three years: Yes Total Score: 100 Level Of Care: New/Established - Level 3 Electronic Signature(s) Signed: 06/29/2022 2:17:30 PM By: Nurse, adult RN, BSN Entered By: on 06/29/2022 09:05:21 ( ) [id number] Page 3 of 10 -------------------------------------------------------------------------------- Encounter Discharge Information Details Patient Name: Date of Service: KESHAWNA, [name] 06/29/2022 8:00 A M Medical Record Number: 13/05/2022 Patient Account Number: Donna [name] Date of Birth/Sex: Treating RN: 05/03/1966 (56 y.o. Donna [name], 13/05/2022 Primary Care Zared Knoth: [phone] Other Clinician: Referring Kendrah Lovern: Treating Edwinna Rochette/Extender: [id number] in Treatment: 0 Encounter Discharge Information Items Post Procedure Vitals Discharge Condition: Stable Temperature (F): 98.7 Ambulatory Status: Ambulatory Pulse (bpm): 67 Discharge Destination: Home Respiratory Rate (breaths/min): 17 Transportation: Private  Auto Blood Pressure (mmHg): 155/83 Accompanied By: self Schedule Follow-up Appointment: Yes Clinical Summary of Care: Electronic Signature(s) Signed: 06/29/2022 2:17:30 PM By: 57 RN, BSN Entered By: [name] on 06/29/2022 09:14:23 -------------------------------------------------------------------------------- Lower Extremity Assessment Details Patient Name: Date of Service: Hillard Danker. 06/29/2022 8:00 A M Medical Record Number: 13/05/2022 Patient Account Number: Shawn Stall Date of Birth/Sex: Treating RN: 30-Jan-1966 (56 y.o. Donna [name], [name] Primary Care Jezreel Justiniano: 13/05/2022 Other Clinician: Referring Kyllie Pettijohn: Treating Menno Vanbergen/Extender: [id number] in Treatment: 0 Edema Assessment Assessed: [Left: No] [Right: Yes] [Left: Edema] [Right: :] Calf Left: Right: Point of Measurement: 31 cm From Medial Instep 36.5 cm Ankle Left: Right: Point of Measurement: 11 cm From Medial Instep 21 cm Knee To Floor Left: Right: From Medial Instep 41 cm Vascular Assessment Pulses: Dorsalis Pedis Palpable: [Right:Yes] Posterior Tibial Palpable: [Right:Yes] Electronic Signature(s) Signed: 07/02/2022 4:10:29 PM By: 13/12/1965 RN 57, Tiffanye R (Ardis Rowan) PM By: Hillard Danker RN [phone].pdf Page 4 of 10 Signed: 07/02/2022 4:10:29 Entered By: Fonnie Mu on 06/29/2022 08:22:11 -------------------------------------------------------------------------------- Multi Wound Chart Details Patient Name: Date of Service: ADALY, PUDER 06/29/2022 8:00 A M Medical Record Number: [id number] Patient Account Number: 07/04/2022 Date of Birth/Sex: Treating RN: [birth year] (56 y.o. F) Primary Care Louvinia Cumbo: Donna [name] Other Clinician: Referring Jeffrie Stander: Treating Lyllian Gause/Extender: 13/05/2022 in Treatment: 0 Vital Signs Height(in): Capillary Blood  Glucose(mg/dl): 762831517 Weight(lbs): Pulse(bpm): 67 Body Mass Index(BMI): Blood Pressure(mmHg): 155/83 Temperature(F): 98.7 Respiratory Rate(breaths/min): 17 [3:Photos:] [N/A:N/A] Right Amputation Site - T oe N/A N/A Wound Location: Surgical Injury N/A N/A Wounding Event: Diabetic Wound/Ulcer of the Lower N/A N/A Primary Etiology: Extremity Open Surgical Wound N/A N/A Secondary Etiology: Hypertension, Type II Diabetes, N/A N/A Comorbid History: Neuropathy 05/21/2022 N/A N/A Date Acquired: 0 N/A N/A Weeks of Treatment: Open N/A N/A Wound Status: No N/A N/A Wound Recurrence: 4.8x1.3x0.9 N/A N/A Measurements L x W x D (cm) 4.901 N/A N/A A (cm) : rea 4.411 N/A N/A Volume (cm) : Grade 4 N/A N/A Classification: Other N/A N/A 13/12/1965 Verification: Medium N/A N/A Exudate A mount: Serosanguineous N/A N/A Exudate Type: red, brown N/A N/A Exudate Color: Distinct, outline attached N/A N/A Wound Margin: Small (1-33%) N/A N/A Granulation A mount: Red, Pink N/A N/A Granulation Quality: Large (67-100%) N/A  N/A Necrotic A mount: Eschar, Adherent Slough N/A N/A Necrotic Tissue: Fat Layer (Subcutaneous Tissue): Yes N/A N/A Exposed Structures: Fascia: No Tendon: No Muscle: No Joint: No Bone: No None N/A N/A Epithelialization: Debridement - Excisional N/A N/A Debridement: Pre-procedure Verification/Time Out 09:00 N/A N/A Taken: Lidocaine 4% T opical Solution N/A N/A Pain Control: Necrotic/Eschar, Subcutaneous, N/A N/A Tissue Debrided: Slough Skin/Subcutaneous Tissue N/A N/A Level: 6.24 N/A N/A Debridement A (sq cm): rea Blade, Curette, Forceps N/A N/A Instrument: Minimum N/A N/A Bleeding: Pressure N/A N/A Hemostasis Achieved: 0 N/A N/A Procedural PainLEONETTA, MCGIVERN ([id number]) [id number] Page 5 of 10 0 N/A N/A Post Procedural Pain: Procedure was tolerated well N/A N/A Debridement Treatment Response: 4.8x1.3x0.9 N/A  N/A Post Debridement Measurements L x W x D (cm) 4.411 N/A N/A Post Debridement Volume: (cm) Callus: Yes N/A N/A Periwound Skin Texture: Excoriation: No Induration: No Crepitus: No Rash: No Scarring: No Maceration: No N/A N/A Periwound Skin Moisture: Dry/Scaly: No Atrophie Blanche: No N/A N/A Periwound Skin Color: Cyanosis: No Ecchymosis: No Erythema: No Hemosiderin Staining: No Mottled: No Pallor: No Rubor: No No Abnormality N/A N/A Temperature: Debridement N/A N/A Procedures Performed: Treatment Notes Wound #3 (Amputation Site - Toe) Wound Laterality: Right Cleanser Vashe Discharge Instruction: use to cleanse wound daily. Peri-Wound Care Topical Primary Dressing KerraCel Ag Gelling Fiber Dressing, 4x5 in (silver alginate) Discharge Instruction: Apply silver alginate over the santyl. Santyl Ointment Discharge Instruction: Apply in office today and use once it arrives at home instead of medihoney. MediHoney Gel, tube 1.5 (oz) Discharge Instruction: Use this daily until santyl arrives. Office to provide the medihoney. Secondary Dressing Woven Gauze Sponge, Non-Sterile 4x4 in Discharge Instruction: Apply over primary dressing as directed. Secured With Conforming Stretch Gauze Bandage, Sterile 2x75 (in/in) Discharge Instruction: Secure with stretch gauze as directed. 76M Medipore H Soft Cloth Surgical T ape, 4 x 10 (in/yd) Discharge Instruction: Secure with tape as directed. Compression Wrap Compression Stockings Add-Ons Electronic Signature(s) Signed: 06/29/2022 10:08:27 AM By: Geralyn Corwin DO Entered By: Geralyn Corwin on 06/29/2022 09:14:53 -------------------------------------------------------------------------------- Multi-Disciplinary Care Plan Details Patient Name: Date of Service: Donna [name]. 06/29/2022 8:00 A M Medical Record Number: [id number] Patient Account Number: [id number] Date of Birth/Sex: Treating RN: [birth year] (56 y.o. [name] [place], [place] R ([id number]) [id number] Page 6 of 10 Primary Care [place] Liz: Hillard Danker Other Clinician: Referring Teng Decou: Treating Tabbatha Bordelon/Extender: Leandrew Koyanagi in Treatment: 0 Active Inactive Necrotic Tissue Nursing Diagnoses: Impaired tissue integrity related to necrotic/devitalized tissue Goals: Necrotic/devitalized tissue will be minimized in the wound bed Date Initiated: 06/29/2022 Target Resolution Date: 07/27/2022 Goal Status: Active Patient/caregiver will verbalize understanding of reason and process for debridement of necrotic tissue Date Initiated: 06/29/2022 Target Resolution Date: 07/26/2022 Goal Status: Active Interventions: Assess patient pain level pre-, during and post procedure and prior to discharge Provide education on necrotic tissue and debridement process Treatment Activities: Apply topical anesthetic as ordered : 06/29/2022 Notes: Orientation to the Wound Care Program Nursing Diagnoses: Knowledge deficit related to the wound healing center program Goals: Patient/caregiver will verbalize understanding of the Wound Healing Center Program Date Initiated: 06/29/2022 Target Resolution Date: 07/27/2022 Goal Status: Active Interventions: Provide education on orientation to the wound center Notes: Pain, Acute or Chronic Nursing Diagnoses: Potential alteration in comfort, pain Goals: Patient will verbalize adequate pain control and receive pain control interventions during procedures as needed Date Initiated: 06/29/2022 Target Resolution Date: 07/26/2022 Goal Status: Active Interventions: Complete pain assessment as per visit requirements Encourage patient to  take pain medications as prescribed Provide education on pain management Treatment Activities: Administer pain control measures as ordered : 06/29/2022 Notes: Wound/Skin Impairment Nursing Diagnoses: Knowledge  deficit related to ulceration/compromised skin integrity Goals: Patient/caregiver will verbalize understanding of skin care regimen Date Initiated: 06/29/2022 Target Resolution Date: 07/26/2022 Goal Status: Active Interventions: Assess patient/caregiver ability to perform ulcer/skin care regimen upon admission and as needed Assess ulceration(s) every visit Donna BollmanCURRIN, Lilleigh R ([id number]) [id number] Page 7 of 10 Provide education on ulcer and skin care Treatment Activities: Skin care regimen initiated : 06/29/2022 Topical wound management initiated : 06/29/2022 Notes: Electronic Signature(s) Signed: 06/29/2022 2:17:30 PM By: Shawn Stalleaton, Bobbi RN, BSN Entered By: Shawn Stalleaton, Bobbi on 06/29/2022 09:02:46 -------------------------------------------------------------------------------- Pain Assessment Details Patient Name: Date of Service: Donna BollmanURRIN, Ofelia R. 06/29/2022 8:00 A M Medical Record Number: [id number] Patient Account Number: [id number] Date of Birth/Sex: Treating RN: 03/12/1966 (56 y.o. Ardis RowanF) Breedlove, [name] Primary Care Aerin Delany: Hillard Dankerrawford, Elizabeth Other Clinician: Referring Khala Tarte: Treating Brookie Wayment/Extender: Leandrew KoyanagiHoffman, Jessica Crawford, Elizabeth Weeks in Treatment: 0 Active Problems Location of Pain Severity and Description of Pain Patient Has Paino No Site Locations Pain Management and Medication Current Pain Management: Electronic Signature(s) Signed: 07/02/2022 4:10:29 PM By: Fonnie MuBreedlove, [name] RN Entered By: Fonnie MuBreedlove, [name] on 06/29/2022 08:22:32 -------------------------------------------------------------------------------- Patient/Caregiver Education Details Patient Name: Date of Service: Donna BollmanURRIN, Davie R. 11/10/2023andnbsp8:00 A M Medical Record Number: [id number] Patient Account Number: [id number] Date of Birth/Gender: Treating RN: 02/23/1966 (56 y.o. Arta SilenceF) Deaton, Bobbi Primary Care Physician: Hillard Dankerrawford, Elizabeth Other Clinician: Referring  Physician: Treating Physician/Extender: Kizzie FurnishHoffman, Jessica Crawford, Elizabeth VestaURRIN, [place] R ([id number]) [id number] Page 8 of 10 Weeks in Treatment: 0 Education Assessment Education Provided To: Patient Education Topics Provided Welcome T [facility]: o Handouts: Welcome T [facility] o Methods: Explain/Verbal Responses: Reinforcements needed Electronic Signature(s) Signed: 06/29/2022 2:17:30 PM By: Shawn Stalleaton, Bobbi RN, BSN Entered By: Shawn Stalleaton, Bobbi on 06/29/2022 09:04:31 -------------------------------------------------------------------------------- Wound Assessment Details Patient Name: Date of Service: Donna BollmanURRIN, Jomayra R. 06/29/2022 8:00 A M Medical Record Number: [id number] Patient Account Number: [id number] Date of Birth/Sex: Treating RN: 10/30/1965 (56 y.o. Ardis RowanF) Breedlove, [name] Primary Care Lan Mcneill: Hillard Dankerrawford, Elizabeth Other Clinician: Referring Julicia Krieger: Treating Ashby Leflore/Extender: Leandrew KoyanagiHoffman, Jessica Crawford, Elizabeth Weeks in Treatment: 0 Wound Status Wound Number: 3 Primary Etiology: Diabetic Wound/Ulcer of the Lower Extremity Wound Location: Right Amputation Site - Toe Secondary Etiology: Open Surgical Wound Wounding Event: Surgical Injury Wound Status: Open Date Acquired: 05/21/2022 Comorbid History: Hypertension, Type II Diabetes, Neuropathy Weeks Of Treatment: 0 Clustered Wound: No Photos Wound Measurements Length: (cm) 4.8 Width: (cm) 1.3 Depth: (cm) 0.9 Area: (cm) 4.901 Volume: (cm) 4.411 % Reduction in Area: % Reduction in Volume: Epithelialization: None Tunneling: No Undermining: No Wound Description Classification: Grade 4 Wagner Verification: Other Wound Margin: Distinct, outline attached Method: bone biopsy- positive staph au Rationale: wet gangrene ED admission. Exudate Amount: Medium Exudate Type: Serosanguineous Counihan, Rhanda R ([id number]) Exudate Color: red, brown Foul Odor After  Cleansing: No Slough/Fibrino Yes reus [id number] Page 9 of 10 Wound Bed Granulation Amount: Small (1-33%) Exposed Structure Granulation Quality: Red, Pink Fascia Exposed: No Necrotic Amount: Large (67-100%) Fat Layer (Subcutaneous Tissue) Exposed: Yes Necrotic Quality: Eschar, Adherent Slough Tendon Exposed: No Muscle Exposed: No Joint Exposed: No Bone Exposed: No Periwound Skin Texture Texture Color No Abnormalities Noted: No No Abnormalities Noted: No Callus: Yes Atrophie Blanche: No Crepitus: No Cyanosis: No Excoriation: No Ecchymosis: No Induration: No Erythema: No Rash: No Hemosiderin Staining: No Scarring: No Mottled: No Pallor: No Moisture Rubor: No No Abnormalities Noted: No  Dry / Scaly: No Temperature / Pain Maceration: No Temperature: No Abnormality Treatment Notes Wound #3 (Amputation Site - Toe) Wound Laterality: Right Cleanser Vashe Discharge Instruction: use to cleanse wound daily. Peri-Wound Care Topical Primary Dressing KerraCel Ag Gelling Fiber Dressing, 4x5 in (silver alginate) Discharge Instruction: Apply silver alginate over the santyl. Santyl Ointment Discharge Instruction: Apply in office today and use once it arrives at home instead of medihoney. MediHoney Gel, tube 1.5 (oz) Discharge Instruction: Use this daily until santyl arrives. Office to provide the medihoney. Secondary Dressing Woven Gauze Sponge, Non-Sterile 4x4 in Discharge Instruction: Apply over primary dressing as directed. Secured With Conforming Stretch Gauze Bandage, Sterile 2x75 (in/in) Discharge Instruction: Secure with stretch gauze as directed. 62M Medipore H Soft Cloth Surgical T ape, 4 x 10 (in/yd) Discharge Instruction: Secure with tape as directed. Compression Wrap Compression Stockings Add-Ons Electronic Signature(s) Signed: 06/29/2022 2:17:30 PM By: Shawn Stall RN, BSN Signed: 07/02/2022 4:10:29 PM By: Fonnie Mu  RN Entered By: Shawn Stall on 06/29/2022 09:01:46 Donna [name] ([id number]) [id number] Page 10 of 10 -------------------------------------------------------------------------------- Vitals Details Patient Name: Date of Service: SHIASIA, PORRO 06/29/2022 8:00 A M Medical Record Number: [id number] Patient Account Number: [id number] Date of Birth/Sex: Treating RN: 09-22-1965 (56 y.o. Ardis Rowan, [name] Primary Care Tiesha Marich: Hillard Danker Other Clinician: Referring Zayvien Canning: Treating Jonh Mcqueary/Extender: Leandrew Koyanagi in Treatment: 0 Vital Signs Time Taken: 08:14 Temperature (F): 98.7 Pulse (bpm): 67 Respiratory Rate (breaths/min): 17 Blood Pressure (mmHg): 155/83 Capillary Blood Glucose (mg/dl): 423 Reference Range: 80 - 120 mg / dl Electronic Signature(s) Signed: 07/02/2022 4:10:29 PM By: Fonnie Mu RN Entered By: Fonnie Mu on 06/29/2022 08:14:54

## 2022-07-06 ENCOUNTER — Encounter (HOSPITAL_BASED_OUTPATIENT_CLINIC_OR_DEPARTMENT_OTHER): Payer: 59 | Admitting: Internal Medicine

## 2022-07-06 DIAGNOSIS — E1140 Type 2 diabetes mellitus with diabetic neuropathy, unspecified: Secondary | ICD-10-CM | POA: Diagnosis not present

## 2022-07-06 DIAGNOSIS — E1152 Type 2 diabetes mellitus with diabetic peripheral angiopathy with gangrene: Secondary | ICD-10-CM | POA: Diagnosis not present

## 2022-07-06 DIAGNOSIS — I10 Essential (primary) hypertension: Secondary | ICD-10-CM | POA: Diagnosis not present

## 2022-07-06 DIAGNOSIS — S91301A Unspecified open wound, right foot, initial encounter: Secondary | ICD-10-CM

## 2022-07-06 DIAGNOSIS — E11621 Type 2 diabetes mellitus with foot ulcer: Secondary | ICD-10-CM

## 2022-07-09 NOTE — Progress Notes (Signed)
[name], Donna ([id number]) [id number] Page 1 of 8 Visit Report for 07/06/2022 Arrival Information Details Patient Name: Date of Service: Donna [name], Donna [name] 07/06/2022 10:15 A M Medical Record Number: [id number] Patient Account Number: [id number] Date of Birth/Sex: Treating RN: 1966-05-21 (56 y.o. F) Primary Care Sahil Milner: Hillard Danker Other Clinician: Referring Nimco Bivens: Treating Peja Allender/Extender: Leandrew Koyanagi in Treatment: 1 Visit Information History Since Last Visit Added or deleted any medications: No Patient Arrived: Ambulatory Any new allergies or adverse reactions: No Arrival Time: 10:31 Had a fall or experienced change in No Accompanied By: self activities of daily living that may affect Transfer Assistance: None risk of falls: Patient Identification Verified: Yes Signs or symptoms of abuse/neglect since last visito No Secondary Verification Process Completed: Yes Hospitalized since last visit: No Patient Requires Transmission-Based Precautions: No Implantable device outside of the clinic excluding No Patient Has Alerts: Yes cellular tissue based products placed in the center Patient Alerts: ABI's: R:1.16 11/2021 since last visit: Has Dressing in Place as Prescribed: Yes Pain Present Now: No Electronic Signature(s) Signed: 07/06/2022 12:40:42 PM By: Thayer Dallas Entered By: Thayer Dallas on 07/06/2022 10:32:44 -------------------------------------------------------------------------------- Encounter Discharge Information Details Patient Name: Date of Service: Donna [name]. 07/06/2022 10:15 A M Medical Record Number: [id number] Patient Account Number: [id number] Date of Birth/Sex: Treating RN: April 11, 1966 (56 y.o. [name] Primary Care Aranda Bihm: Hillard Danker Other Clinician: Referring Emila Steinhauser: Treating Torez Beauregard/Extender: Leandrew Koyanagi in Treatment:  1 Encounter Discharge Information Items Post Procedure Vitals Discharge Condition: Stable Temperature (F): 98.6 Ambulatory Status: Ambulatory Pulse (bpm): 83 Discharge Destination: Home Respiratory Rate (breaths/min): 17 Transportation: Private Auto Blood Pressure (mmHg): 136/71 Accompanied By: self Schedule Follow-up Appointment: Yes Clinical Summary of Care: Electronic Signature(s) Signed: 07/06/2022 5:21:52 PM By: Shawn Stall RN, BSN Entered By: Shawn Stall on 07/06/2022 11:16:08 Donna [name] ([id number]) [id number] Page 2 of 8 -------------------------------------------------------------------------------- Lower Extremity Assessment Details Patient Name: Date of Service: [name], Donna 07/06/2022 10:15 A M Medical Record Number: [id number] Patient Account Number: [id number] Date of Birth/Sex: Treating RN: 10/03/1965 (56 y.o. F) Primary Care Cassey Hurrell: Hillard Danker Other Clinician: Referring Leny Morozov: Treating Jaggar Benko/Extender: Leandrew Koyanagi in Treatment: 1 Edema Assessment Assessed: [Left: No] [Right: No] [Left: Edema] [Right: :] Calf Left: Right: Point of Measurement: 31 cm From Medial Instep 36.2 cm Ankle Left: Right: Point of Measurement: 11 cm From Medial Instep 22 cm Electronic Signature(s) Signed: 07/06/2022 12:40:42 PM By: Thayer Dallas Entered By: Thayer Dallas on 07/06/2022 10:40:23 -------------------------------------------------------------------------------- Multi Wound Chart Details Patient Name: Date of Service: Donna [name]. 07/06/2022 10:15 A M Medical Record Number: [id number] Patient Account Number: [id number] Date of Birth/Sex: Treating RN: 06-01-1966 (55 y.o. F) Primary Care Budd Freiermuth: Hillard Danker Other Clinician: Referring Skyelyn Scruggs: Treating Enis Leatherwood/Extender: Leandrew Koyanagi in Treatment: 1 Vital Signs Height(in): Pulse(bpm):  83 Weight(lbs): Blood Pressure(mmHg): 136/71 Body Mass Index(BMI): Temperature(F): 98.6 Respiratory Rate(breaths/min): 17 [3:Photos:] [N/A:N/A] Right Amputation Site - Toe N/A N/A Wound Location: Surgical Injury N/A N/A Wounding Event: Diabetic Wound/Ulcer of the Lower N/A N/A Primary Etiology: Extremity Open Surgical Wound N/A N/A Secondary Etiology: Hypertension, Type II Diabetes, N/A N/A Comorbid HistoryELIA, NUNLEY ([id number]) [id number] Page 3 of 8 Neuropathy 05/21/2022 N/A N/A Date Acquired: 1 N/A N/A Weeks of Treatment: Open N/A N/A Wound Status: No N/A N/A Wound Recurrence: 2.5x1x0.9 N/A N/A Measurements L x W x D (cm) 1.963 N/A N/A A (cm) : rea 1.767 N/A N/A Volume (cm) : 59.90% N/A N/A %  Reduction in A rea: 59.90% N/A N/A % Reduction in Volume: Grade 4 N/A N/A Classification: Medium N/A N/A Exudate A mount: Serosanguineous N/A N/A Exudate Type: red, brown N/A N/A Exudate Color: Distinct, outline attached N/A N/A Wound Margin: Small (1-33%) N/A N/A Granulation A mount: Red, Pink N/A N/A Granulation Quality: Large (67-100%) N/A N/A Necrotic A mount: Eschar, Adherent Slough N/A N/A Necrotic Tissue: Fat Layer (Subcutaneous Tissue): Yes N/A N/A Exposed Structures: Fascia: No Tendon: No Muscle: No Joint: No Bone: No None N/A N/A Epithelialization: Debridement - Excisional N/A N/A Debridement: Pre-procedure Verification/Time Out 11:10 N/A N/A Taken: Lidocaine 4% Topical Solution N/A N/A Pain Control: Subcutaneous, Slough N/A N/A Tissue Debrided: Skin/Subcutaneous Tissue N/A N/A Level: 2.5 N/A N/A Debridement A (sq cm): rea Curette N/A N/A Instrument: Minimum N/A N/A Bleeding: Pressure N/A N/A Hemostasis A chieved: 0 N/A N/A Procedural Pain: 0 N/A N/A Post Procedural Pain: Procedure was tolerated well N/A N/A Debridement Treatment Response: 2.5x1x0.9 N/A N/A Post Debridement Measurements  L x W x D (cm) 1.767 N/A N/A Post Debridement Volume: (cm) Callus: Yes N/A N/A Periwound Skin Texture: Excoriation: No Induration: No Crepitus: No Rash: No Scarring: No Maceration: No N/A N/A Periwound Skin Moisture: Dry/Scaly: No Atrophie Blanche: No N/A N/A Periwound Skin Color: Cyanosis: No Ecchymosis: No Erythema: No Hemosiderin Staining: No Mottled: No Pallor: No Rubor: No No Abnormality N/A N/A Temperature: Debridement N/A N/A Procedures Performed: Treatment Notes Wound #3 (Amputation Site - Toe) Wound Laterality: Right Cleanser Vashe Discharge Instruction: use to cleanse wound daily. Peri-Wound Care Topical Primary Dressing KerraCel Ag Gelling Fiber Dressing, 4x5 in (silver alginate) Discharge Instruction: Apply silver alginate over the santyl. Santyl Ointment Discharge Instruction: Apply in office today and use once it arrives at home instead of medihoney. Secondary Dressing Woven Gauze Sponge, Non-Sterile 4x4 in Discharge Instruction: Apply over primary dressing as directed. KIMMERLY, LORA ([id number]) [id number] Page 4 of 8 Secured With [employer], Sterile 2x75 (in/in) Discharge Instruction: Secure with stretch gauze as directed. 87M Medipore H Soft Cloth Surgical T ape, 4 x 10 (in/yd) Discharge Instruction: Secure with tape as directed. Compression Wrap Compression Stockings Add-Ons Electronic Signature(s) Signed: 07/09/2022 11:34:26 AM By: Geralyn Corwin DO Entered By: Geralyn Corwin on 07/06/2022 11:24:30 -------------------------------------------------------------------------------- Multi-Disciplinary Care Plan Details Patient Name: Date of Service: Donna [name]. 07/06/2022 10:15 A M Medical Record Number: [id number] Patient Account Number: [id number] Date of Birth/Sex: Treating RN: [birth year] (56 y.o. [name], [name] Primary Care Geneen Dieter: Hillard Danker Other  Clinician: Referring Joao Mccurdy: Treating Deionna Marcantonio/Extender: Leandrew Koyanagi in Treatment: 1 Active Inactive Necrotic Tissue Nursing Diagnoses: Impaired tissue integrity related to necrotic/devitalized tissue Goals: Necrotic/devitalized tissue will be minimized in the wound bed Date Initiated: 06/29/2022 Target Resolution Date: 07/27/2022 Goal Status: Active Patient/caregiver will verbalize understanding of reason and process for debridement of necrotic tissue Date Initiated: 06/29/2022 Target Resolution Date: 07/26/2022 Goal Status: Active Interventions: Assess patient pain level pre-, during and post procedure and prior to discharge Provide education on necrotic tissue and debridement process Treatment Activities: Apply topical anesthetic as ordered : 06/29/2022 Notes: Pain, Acute or Chronic Nursing Diagnoses: Potential alteration in comfort, pain Goals: Patient will verbalize adequate pain control and receive pain control interventions during procedures as needed Date Initiated: 06/29/2022 Target Resolution Date: 07/26/2022 Goal Status: Active Interventions: Complete pain assessment as per visit requirements Encourage patient to take pain medications as prescribed Provide education on pain management Treatment Activities: ANSHU, WEHNER ([id number]) [phone].pdf Page 5 of 8 Administer  pain control measures as ordered : 06/29/2022 Notes: Wound/Skin Impairment Nursing Diagnoses: Knowledge deficit related to ulceration/compromised skin integrity Goals: Patient/caregiver will verbalize understanding of skin care regimen Date Initiated: 06/29/2022 Target Resolution Date: 07/26/2022 Goal Status: Active Interventions: Assess patient/caregiver ability to perform ulcer/skin care regimen upon admission and as needed Assess ulceration(s) every visit Provide education on ulcer and skin care Treatment Activities: Skin care  regimen initiated : 06/29/2022 Topical wound management initiated : 06/29/2022 Notes: Electronic Signature(s) Signed: 07/06/2022 5:21:52 PM By: Shawn Stalleaton, Bobbi RN, BSN Entered By: Shawn Stalleaton, Bobbi on 07/06/2022 11:11:16 -------------------------------------------------------------------------------- Pain Assessment Details Patient Name: Date of Service: Donna BollmanURRIN, Arrion R. 07/06/2022 10:15 A M Medical Record Number: [id number] Patient Account Number: [id number] Date of Birth/Sex: Treating RN: 07/12/1966 (56 y.o. F) Primary Care Beatrice Ziehm: Hillard Dankerrawford, Elizabeth Other Clinician: Referring Alaric Gladwin: Treating Thera Basden/Extender: Leandrew KoyanagiHoffman, Jessica Crawford, Elizabeth Weeks in Treatment: 1 Active Problems Location of Pain Severity and Description of Pain Patient Has Paino No Site Locations Pain Management and Medication Current Pain Management: Electronic Signature(s) Signed: 07/06/2022 12:40:42 PM By: Karl Pockick, Kimberly Grayer, Jorge NyLYNDSEY R ([id number]) PM By: Thayer Dallasick, Kimberly [id number] Page 6 of 8 Signed: 07/06/2022 12:40:42 Entered By: Thayer Dallasick, Kimberly on 07/06/2022 10:42:30 -------------------------------------------------------------------------------- Patient/Caregiver Education Details Patient Name: Date of Service: Donna BollmanCURRIN, Allyana R. 11/17/2023andnbsp10:15 A M Medical Record Number: [id number] Patient Account Number: [id number] Date of Birth/Gender: Treating RN: 05/26/1966 (56 y.o. Arta SilenceF) Deaton, Bobbi Primary Care Physician: Hillard Dankerrawford, Elizabeth Other Clinician: Referring Physician: Treating Physician/Extender: Leandrew KoyanagiHoffman, Jessica Crawford, Elizabeth Weeks in Treatment: 1 Education Assessment Education Provided To: Patient Education Topics Provided Pain: Handouts: A Guide to Pain Control Methods: Explain/Verbal Responses: Reinforcements needed Electronic Signature(s) Signed: 07/06/2022 5:21:52 PM By: Shawn Stalleaton, Bobbi RN, BSN Entered By: Shawn Stalleaton, Bobbi on 07/06/2022  11:11:31 -------------------------------------------------------------------------------- Wound Assessment Details Patient Name: Date of Service: Donna BollmanURRIN, Jiana R. 07/06/2022 10:15 A M Medical Record Number: [id number] Patient Account Number: [id number] Date of Birth/Sex: Treating RN: 12/14/1965 (56 y.o. F) Primary Care Kamrin Sibley: Hillard Dankerrawford, Elizabeth Other Clinician: Referring Metztli Sachdev: Treating Necie Wilcoxson/Extender: Leandrew KoyanagiHoffman, Jessica Crawford, Elizabeth Weeks in Treatment: 1 Wound Status Wound Number: 3 Primary Etiology: Diabetic Wound/Ulcer of the Lower Extremity Wound Location: Right Amputation Site - Toe Secondary Etiology: Open Surgical Wound Wounding Event: Surgical Injury Wound Status: Open Date Acquired: 05/21/2022 Comorbid History: Hypertension, Type II Diabetes, Neuropathy Weeks Of Treatment: 1 Clustered Wound: No Photos Cleora FleetCURRIN, Rayaan R ([id number]) [id number] Page 7 of 8 Wound Measurements Length: (cm) 2.5 Width: (cm) 1 Depth: (cm) 0.9 Area: (cm) 1.963 Volume: (cm) 1.767 % Reduction in Area: 59.9% % Reduction in Volume: 59.9% Epithelialization: None Tunneling: No Undermining: No Wound Description Classification: Grade 4 Wound Margin: Distinct, outline attached Exudate Amount: Medium Exudate Type: Serosanguineous Exudate Color: red, brown Foul Odor After Cleansing: No Slough/Fibrino Yes Wound Bed Granulation Amount: Small (1-33%) Exposed Structure Granulation Quality: Red, Pink Fascia Exposed: No Necrotic Amount: Large (67-100%) Fat Layer (Subcutaneous Tissue) Exposed: Yes Necrotic Quality: Eschar, Adherent Slough Tendon Exposed: No Muscle Exposed: No Joint Exposed: No Bone Exposed: No Periwound Skin Texture Texture Color No Abnormalities Noted: No No Abnormalities Noted: No Callus: Yes Atrophie Blanche: No Crepitus: No Cyanosis: No Excoriation: No Ecchymosis: No Induration: No Erythema: No Rash: No Hemosiderin  Staining: No Scarring: No Mottled: No Pallor: No Moisture Rubor: No No Abnormalities Noted: No Dry / Scaly: No Temperature / Pain Maceration: No Temperature: No Abnormality Treatment Notes Wound #3 (Amputation Site - Toe) Wound Laterality: Right Cleanser Vashe Discharge Instruction: use to cleanse wound daily. Peri-Wound Care Topical Primary Dressing KerraCel Ag Gelling Fiber  Dressing, 4x5 in (silver alginate) Discharge Instruction: Apply silver alginate over the santyl. Santyl Ointment Discharge Instruction: Apply in office today and use once it arrives at home instead of medihoney. Secondary Dressing Woven Gauze Sponge, Non-Sterile 4x4 in Discharge Instruction: Apply over primary dressing as directed. Secured With [employer], Sterile 2x75 (in/in) Dunagan, Margene R ([id number]) [phone].pdf Page 8 of 8 Discharge Instruction: Secure with stretch gauze as directed. 29M Medipore H Soft Cloth Surgical T ape, 4 x 10 (in/yd) Discharge Instruction: Secure with tape as directed. Compression Wrap Compression Stockings Add-Ons Electronic Signature(s) Signed: 07/06/2022 12:40:42 PM By: Thayer Dallas Entered By: Thayer Dallas on 07/06/2022 10:42:07 -------------------------------------------------------------------------------- Vitals Details Patient Name: Date of Service: Donna [name]. 07/06/2022 10:15 A M Medical Record Number: [id number] Patient Account Number: [id number] Date of Birth/Sex: Treating RN: January 31, 1966 (56 y.o. F) Primary Care Saia Derossett: Hillard Danker Other Clinician: Referring Makeyla Govan: Treating Jovannie Ulibarri/Extender: Leandrew Koyanagi in Treatment: 1 Vital Signs Time Taken: 10:34 Temperature (F): 98.6 Pulse (bpm): 83 Respiratory Rate (breaths/min): 17 Blood Pressure (mmHg): 136/71 Reference Range: 80 - 120 mg / dl Electronic Signature(s) Signed: 07/06/2022 12:40:42 PM By: Thayer Dallas Entered By: Thayer Dallas on 07/06/2022 10:34:37

## 2022-07-09 NOTE — Progress Notes (Signed)
Donna [name], Donna [name] ([id number]) [id number] Page 1 of 10 Visit Report for 07/06/2022 Chief Complaint Document Details Patient Name: Date of Service: Donna [name], Donna [name] 07/06/2022 10:15 A M Medical Record Number: [id number] Patient Account Number: [id number] Date of Birth/Sex: Treating RN: 1966/01/13 (56 y.o. F) Primary Care Provider: Hillard Danker Other Clinician: Referring Provider: Treating Provider/Extender: Leandrew Koyanagi in Treatment: 1 Information Obtained from: Patient Chief Complaint 06/29/2022; surgical wound to the right foot status post right fifth ray amputation Electronic Signature(s) Signed: 07/09/2022 11:34:26 AM By: Geralyn Corwin DO Entered By: Geralyn Corwin on 07/06/2022 11:24:43 -------------------------------------------------------------------------------- Debridement Details Patient Name: Date of Service: Donna [name] 07/06/2022 10:15 A M Medical Record Number: [id number] Patient Account Number: [id number] Date of Birth/Sex: Treating RN: 10-29-1965 (56 y.o. Donna [name], Millard.Loa Primary Care Provider: Hillard Danker Other Clinician: Referring Provider: Treating Provider/Extender: Leandrew Koyanagi in Treatment: 1 Debridement Performed for Assessment: Wound #3 Right Amputation Site - Toe Performed By: Physician Geralyn Corwin, DO Debridement Type: Debridement Severity of Tissue Pre Debridement: Fat layer exposed Level of Consciousness (Pre-procedure): Awake and Alert Pre-procedure Verification/Time Out Yes - 11:10 Taken: Start Time: 11:11 Pain Control: Lidocaine 4% T opical Solution T Area Debrided (L x W): otal 2.5 (cm) x 1 (cm) = 2.5 (cm) Tissue and other material debrided: Viable, Non-Viable, Slough, Subcutaneous, Slough Level: Skin/Subcutaneous Tissue Debridement Description: Excisional Instrument: Curette Bleeding: Minimum Hemostasis Achieved:  Pressure End Time: 11:13 Procedural Pain: 0 Post Procedural Pain: 0 Response to Treatment: Procedure was tolerated well Level of Consciousness (Post- Awake and Alert procedure): Post Debridement Measurements of Total Wound Length: (cm) 2.5 Width: (cm) 1 Depth: (cm) 0.9 Volume: (cm) 1.767 Character of Wound/Ulcer Post Debridement: Requires Further Debridement Severity of Tissue Post Debridement: Fat layer exposed Donna [name], Donna [name] ([id number]) [phone].pdf Page 2 of 10 Post Procedure Diagnosis Same as Pre-procedure Electronic Signature(s) Signed: 07/06/2022 5:21:52 PM By: Shawn Stall RN, BSN Signed: 07/09/2022 11:34:26 AM By: Geralyn Corwin DO Entered By: Shawn Stall on 07/06/2022 11:14:06 -------------------------------------------------------------------------------- HPI Details Patient Name: Date of Service: Donna [name]. 07/06/2022 10:15 A M Medical Record Number: [id number] Patient Account Number: [id number] Date of Birth/Sex: Treating RN: 07-10-1966 (56 y.o. 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 She tells me that since at least last summer she has had a progressive callused area that has opened closed but is never really healed. She has a history of calluses on her feet. She is a diabetic with neuropathy. She was referred to Dr. All[facility] Katz at [facility] who felt that this might be a large plantar wart she was treated with laser for 2 treatments and then subsequently with ammonium lactate none of this is apparently made any difference according to the patient. She has a very odd looking area with thick skin and callus and an almost crossed like area of  open tissue. She also has erythema above this which she says is chronic. The other interesting part of her recent history is recurrent cellulitis on the right lower leg although this is separated from the area we are looking at. She has had trips to see infectious disease has been on Keflex for as long as 20 days. This is presumed to be strep. She saw Dr. Renold Don Past medical history includes type 2  diabetes, recurrent cellulitis of the right leg, right Achilles tendon repair a year ago for spontaneous rupture ABI in our clinic was 1.09 on the right 2/17; patient admitted the clinic last week had a thick callused area on the right lateral heel she is a diabetic with neuropathy. She had thick callus and across like area of open wound. I aggressively debrided this area to remove most of the callus and gave her Hydrofera Blue and heel off loader, heel cup and she has been wearing this religiously trying to keep the pressure off the area. She comes in today with a cross-like area of injury has healed she has a new open area that I do not remember from last time. Still some dry flaking skin and callus but not nearly as much as last week at which time I had to use a scalpel to remove this. 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 She came in 2 weeks ago with a very thick hyperkeratotic callused area on her right lateral heel. There was a "crossed" shaped area of split tissue with an open wound at the bottom. This had previously been treated as a possible wart without improvement. I removed all of the callus when she first came in here. This is mostly epithelialized over still thick skin but nothing like it was. She has 1 small open area that has not healed she has been more religious about offloading the area 10/27/2020 upon evaluation today patient appears to be doing well with regard to her wound although she does still apparently appear to have something still open in the base of the  wound. Fortunately there is no evidence of infection at this time which is great news and in general I feel like she is doing decently well though she has a lot of callus buildup she is using the offloading shoe for her heel. At home she does not always wear this she tells me. Generally she is using flip-flops there. Of note her primary care provider did give her Keflex yesterday for cellulitis of this leg but has nothing to do with the heel location. 3/17; the patient's area actually looks a lot better. There is no open wound here. Much less callus. She was given a prescription for 40% urea cream I am not sure that really is required this week. She is going to require less friction on this area. I think she is going to need a different type of shoe going forward Readmission: 12-13-2028 upon evaluation today patient presents for reevaluation in the clinic she was last seen by Dr. Leanord Hawking on 11-03-2020 when she was discharged this was due to a crack on her heel. Currently she is actually having an issue with her great toe on the right foot. She tells me that this has been present currently for a couple of weeks since around November 22, 2021. She has had a lot of callus over this area in general but not an open wound until that time. Subsequently this has more recently opened and has Become a greater concern she is also noted some odor which is disconcerting to her. She tells me that her greatest concern is that "she does not lose this toe". Again I completely understand her concern in this regard I definitely think we need to be aggressive in treating this and try to get things better. She is very Adult nurse. Patient does have a history of diabetes mellitus type 2 with diabetic neuropathy. She also tells  me that she does have a history as well of hypertension which is pretty well controlled. She has not yet had an x-ray of the area and has not been on any antibiotics. Both are things I think we will need to  do today. 12-20-2021 upon evaluation today patient appears to be doing well with regard to her toe ulcer all things considered. She has been tolerating the dressing changes without complication. I do have her x-ray as well as her culture for review today the culture did so Staphylococcus aureus no MRSA noted which was great news susceptible to doxycycline which is what I did place her on last week. Overall the odor that she was noticing has also cleared which is great news. With regard to the x-ray there does not appear to be any signs of osteomyelitis or bone erosion at this point which is also great news in general and very pleased with where things stand. 12-27-2021 upon evaluation today patient appears to be doing well with regard to her wound this is actually measuring significantly better which is great news. Overall I am extremely pleased with where we stand today. No fevers, chills, nausea, vomiting, or diarrhea. 01-03-2022 upon evaluation today patient's wound is actually showing signs of significant improvement which is also news. I am very pleased with what we are seeing there is a little bit of callus buildup still but we are going to work on that today otherwise she seems to be making excellent progress which is great news. Donna [name], Donna [name] ([id number]) [id number] Page 3 of 10 01-10-2022 upon evaluation today patient appears to be doing well currently in regard to her toe ulcer. She has been tolerating the dressing changes without complication. Fortunately there does not appear to be any evidence of active infection locally or systemically which is great news. No fevers, chills, nausea, vomiting, or diarrhea. 01-17-2022 upon evaluation today patient's toe was actually doing about the same this week there is not a tremendous improvement in size but overall the appearance of the wound is better it is much flatter I do think the [facility] has done well in that  regard. We will still wait to hear back on the skin substitute. With that being said I do believe that the patient could benefit from again the skin substitute to try to help get this to heal much more effectively and quickly in the meantime I am trying to help flatten this out which I think is doing a pretty good job.. 6/7; wound on the plantar right great toe over the distal phalanx. Wound is slightly smaller but looks healthier there is rims of epithelialization she still has some callus on the toe indicative of inadequate pressure/friction relief. I talked to her about making sure in the forefoot offloading boot that the weight stays on her heel in stance phase 01-31-2022 upon evaluation patient's wound is still continuing to show signs of really minimal progress unfortunately. I actually believe that she may need to go into the total contact cast in order to get this to heal and I discussed that with her today as well. Obviously this has been a little bit of a barrier for her not because she does not want to do it and does not want to get healed but rather because it is going to prevent her from being able to drive which is obviously going to be a significant quality-of-life issue with what she does in the position she is in at work. 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 I do believe she could work with the cast on but the issue simply is that she cannot drive to get to work. Otherwise should be able to walk and do what she needs to do. 02-07-2022 upon evaluation today patient appears to be doing well with regard to her wounds. She has been tolerating the dressing changes without complication. Fortunately I do not see any signs of infection although the wound itself is not significantly smaller as of yet. This is something that we are trying to work on I do believe a total contact cast would be the right way to go. I discussed this  with the patient today as well. She is actually in agreement with giving this a trial although she is a little nervous about it being that she is claustrophobic. 6/23; patient presents for obligatory cast exchange. She reports that her heel feels sore from the total contact cast. On that she had no issues. 02-14-2022 upon evaluation today patient's wound is actually showing signs of excellent improvement. There does not appear to be any signs of active infection at this time and overall the toe looks amazing in just 1 week's time. We have come down almost half compared to where we were before size wise and this is also him as well. Overall I think that the casting has done extremely good for her and the toe in general looks significantly improved. I think this week since we want to have to mess with the wound bed itself just the callus around she will likely have even better improvement come next time. 02-21-2022 upon evaluation today patient appears to be doing well with regard to her toe. She is actually showing signs of improvement this is a little bit slower than she would like to see but nonetheless we are seeing significant changes in the right direction in my opinion. Fortunately she is also developing a lot less callus on the end of the toe which is helpful for her as well in my opinion. 02-28-2022 upon evaluation today patient appears to be doing well currently in regard to her wound. She has been tolerating the dressing changes. Fortunately I do not see any signs of infection I think the cast is doing an awesome job here. No fevers, chills, nausea, vomiting, or diarrhea. 03-07-2022 upon evaluation today patient appears to be doing well with regard to her wound although the collagen is very stuck and dry on the area on the tip of her toe at this point. Nonetheless I discussed with her that I am a little reluctant to put the cast on this week due to what appeared to been a blister along the left  side of her toe laterally. Nonetheless this has been a little worried about the possibility of infection and I do not see anything obvious I think that we may not want to put the cast on this week just to be on the safe side she voiced understanding. With that being said if were not to use the cast she is going to need to make sure to have still some pressure relief I think making gauze bolster with 3 gauze up underneath the toe is going to do a good job here. We will roll and tape this and then subsequently anchored underneath the big toe should be using the front offloading shoe as well. 7/26; patient I know from a previous stay  in this clinic. Type II diabetic with a wound on her right plantar great toe. She was in a total contact cast to 2 weeks ago the wound was close to closed last week according to our intake nurse. We put Xeroform and gauze on this that the patient has been changing her self. She has a forefoot offloading boot. 03-21-2022 upon a variable evaluation today patient appears to be doing somewhat poorly in regard to callus buildup at this time. Fortunately I do not see any evidence of overall worsening but this is just still open. No fevers, chills, nausea, vomiting, or diarrhea. I think we may have to go back into the cast next week if this is not significantly improved by that time. 03-28-2022 upon evaluation today patient's wound is actually showing signs of significant improvement which is great news. The cast over the past week he has done a great job. This is so small however but still has a little bit of a deeper area that I am concerned about the possibility of it drying out too much under the cast that is what we saw today and callused over. Subsequently I did remove the callus. 04-11-2022 upon evaluation today patient appears to still have a lot of callus buildup today. She was sick with COVID last weeks has been 2 weeks since have seen her. This is what accounts for some of the  callus buildup in my opinion. With that being said she tells me she was not nearly as active working from home with COVID as she can go in. Nonetheless I do believe still that based on what we are seeing this is still too much friction is likely occurring around the toe area. 04-18-2022 upon evaluation today patient appears to be doing excellent in regard to her wound in fact this appears to be completely healed which is great news. Fortunately I do not see any evidence of active infection locally or systemically at this time which is excellent as well. No fevers, chills, nausea, vomiting, or diarrhea. 06/29/2022 Ms. Marquita Palms is a 56 year old female with a past medical history of uncontrolled type 2 diabetes that presents to the clinic for a wound to the right foot. On 06/02/2022 she presented to the hospital for cellulitis of the right foot and found to have osteomyelitis of the fifth toe. She had a 5th ray amputation on 10/14. Her culture grew Staph aureus. She was placed on 10 days of linezolid by infectious disease. She has been doing a dry dressing for the past 3-4 weeks. She currently denies systemic signs of infection. 11/17; patient presents for follow-up. She has been using Santyl and silver alginate to the wound bed daily. She reports using Vashe wound cleanser between dressing changes. She is taking doxycycline currently. She has no issues or complaints today. Electronic Signature(s) Signed: 07/09/2022 11:34:26 AM By: Geralyn Corwin DO Entered By: Geralyn Corwin on 07/06/2022 11:26:16 Donna [name] ([id number]) [id number] Page 4 of 10 -------------------------------------------------------------------------------- Physical Exam Details Patient Name: Date of Service: Donna [name], Donna [name] 07/06/2022 10:15 A M Medical Record Number: [id number] Patient Account Number: [id number] Date of Birth/Sex: Treating RN: 29-Dec-1965 (56 y.o. F) Primary Care  Provider: Hillard Danker Other Clinician: Referring Provider: Treating Provider/Extender: Leandrew Koyanagi in Treatment: 1 Constitutional respirations regular, non-labored and within target range for patient.. Cardiovascular 2+ dorsalis pedis/posterior tibialis pulses. Psychiatric pleasant and cooperative. Notes Right foot: T the lateral aspect she has a surgical wound status post fifth ray amputation. The wound  bed has nonviable tissue and scant granulation tissue. o Slight erythema to the periwound. Electronic Signature(s) Signed: 07/09/2022 11:34:26 AM By: Geralyn Corwin DO Entered By: Geralyn Corwin on 07/06/2022 11:26:42 -------------------------------------------------------------------------------- Physician Orders Details Patient Name: Date of Service: Donna [name] 07/06/2022 10:15 A M Medical Record Number: [id number] Patient Account Number: [id number] Date of Birth/Sex: Treating RN: 11-Oct-1965 (56 y.o. Donna [name], [name] Primary Care Provider: Hillard Danker Other Clinician: Referring Provider: Treating Provider/Extender: Leandrew Koyanagi in Treatment: 1 Verbal / Phone Orders: No Diagnosis Coding ICD-10 Coding Code Description S91.301A Unspecified open wound, right foot, initial encounter E11.621 Type 2 diabetes mellitus with foot ulcer I96 Gangrene, not elsewhere classified M86.9 Osteomyelitis, unspecified Follow-up Appointments ppointment in 2 weeks. - Dr. Mikey Bussing 07/17/2022 Return A Return appointment in 3 weeks. - Dr. Mikey Bussing Anesthetic (In clinic) Topical Lidocaine 4% applied to wound bed Bathing/ Shower/ Hygiene May shower with protection but do not get wound dressing(s) wet. Edema Control - Lymphedema / SCD / Other Elevate legs to the level of the heart or above for 30 minutes daily and/or when sitting, a frequency of: - throughout the day. Avoid standing for long periods of  time. Off-Loading Open toe surgical shoe to: - office to provide a new surgical shoe. ensure no pressure to right lateral foot. Wound Treatment Wound #3 - Amputation Site - Toe Wound Laterality: Right Cleanser: Vashe 1 x Per Day/30 Days Donna [name], Donna [name] ([id number]) [phone].pdf Page 5 of 10 Discharge Instructions: use to cleanse wound daily. Prim Dressing: KerraCel Ag Gelling Fiber Dressing, 4x5 in (silver alginate) (Generic) 1 x Per Day/30 Days ary Discharge Instructions: Apply silver alginate over the santyl. Prim Dressing: Santyl Ointment 1 x Per Day/30 Days ary Discharge Instructions: Apply in office today and use once it arrives at home instead of medihoney. Secondary Dressing: Woven Gauze Sponge, Non-Sterile 4x4 in (Generic) 1 x Per Day/30 Days Discharge Instructions: Apply over primary dressing as directed. Secured With: Insurance underwriter, Sterile 2x75 (in/in) (Generic) 1 x Per Day/30 Days Discharge Instructions: Secure with stretch gauze as directed. Secured With: 73M Medipore H Soft Cloth Surgical T ape, 4 x 10 (in/yd) (Generic) 1 x Per Day/30 Days Discharge Instructions: Secure with tape as directed. Electronic Signature(s) Signed: 07/09/2022 11:34:26 AM By: Geralyn Corwin DO Entered By: Geralyn Corwin on 07/06/2022 11:26:49 -------------------------------------------------------------------------------- Problem List Details Patient Name: Date of Service: Donna [name] 07/06/2022 10:15 A M Medical Record Number: [id number] Patient Account Number: [id number] Date of Birth/Sex: Treating RN: 1965/12/24 (56 y.o. Donna [name], [name] Primary Care Provider: Hillard Danker Other Clinician: Referring Provider: Treating Provider/Extender: Leandrew Koyanagi in Treatment: 1 Active Problems ICD-10 Encounter Code Description Active Date MDM Diagnosis S91.301A Unspecified open wound, right foot, initial  encounter 06/29/2022 No Yes E11.621 Type 2 diabetes mellitus with foot ulcer 06/29/2022 No Yes I96 Gangrene, not elsewhere classified 06/29/2022 No Yes M86.9 Osteomyelitis, unspecified 06/29/2022 No Yes Inactive Problems Resolved Problems Electronic Signature(s) Signed: 07/09/2022 11:34:26 AM By: Geralyn Corwin DO Entered By: Geralyn Corwin on 07/06/2022 11:24:24 Donna [name] ([id number]) [id number] Page 6 of 10 -------------------------------------------------------------------------------- Progress Note Details Patient Name: Date of Service: Donna [name], Donna [name] 07/06/2022 10:15 A M Medical Record Number: [id number] Patient Account Number: [id number] Date of Birth/Sex: Treating RN: 14-Dec-1965 (56 y.o. 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 She tells me that since at least last summer she has had a progressive callused area that has opened closed but is never really healed. She has a history of calluses on her feet. She is a diabetic with neuropathy. She was referred to Dr. Allena Katz at [facility] who felt that this might be a large plantar wart she was treated with laser for 2 treatments and then subsequently with ammonium lactate none of this is apparently made any difference according to the patient. She has a very odd looking area with thick skin and callus and an almost crossed like area of open tissue. She also has erythema above this which she says is chronic. The other interesting part of her recent history is recurrent  cellulitis on the right lower leg although this is separated from the area we are looking at. She has had trips to see infectious disease has been on Keflex for as long as 20 days. This is presumed to be strep. She saw Dr. Renold Don Past medical history includes type 2 diabetes, recurrent cellulitis of the right leg, right Achilles tendon repair a year ago for spontaneous rupture ABI in our clinic was 1.09 on the right 2/17; patient admitted the clinic last week had a thick callused area on the right lateral heel she is a diabetic with neuropathy. She had thick callus and across like area of open wound. I aggressively debrided this area to remove most of the callus and gave her Hydrofera Blue and heel off loader, heel cup and she has been wearing this religiously trying to keep the pressure off the area. She comes in today with a cross-like area of injury has healed she has a new open area that I do not remember from last time. Still some dry flaking skin and callus but not nearly as much as last week at which time I had to use a scalpel to remove this. 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 She came in 2 weeks ago with a very thick hyperkeratotic callused area on her right lateral heel. There was a "crossed" shaped area of split tissue with an open wound at the bottom. This had previously been treated as a possible wart without improvement. I removed all of the callus when she first came in here. This is mostly epithelialized over still thick skin but nothing like it was. She has 1 small open area that has not healed she has been more religious about offloading the area 10/27/2020 upon evaluation today patient appears to be doing well with regard to her wound although she does still apparently appear to have something still open in the base of the wound. Fortunately there is no evidence of infection at this time which is great news and in general I feel like she is doing decently  well though she has a lot of callus buildup she is using the offloading shoe for her heel. At home she does not always wear this she tells me. Generally she is using flip-flops there. Of note her primary care provider did give her Keflex yesterday for cellulitis of this leg but has nothing to do with the heel location. 3/17; the patient's area actually looks a lot better. There is no open wound here. Much less callus. She was given a prescription for 40% urea  cream I am not sure that really is required this week. She is going to require less friction on this area. I think she is going to need a different type of shoe going forward Readmission: 12-13-2028 upon evaluation today patient presents for reevaluation in the clinic she was last seen by Dr. Leanord Hawking on 11-03-2020 when she was discharged this was due to a crack on her heel. Currently she is actually having an issue with her great toe on the right foot. She tells me that this has been present currently for a couple of weeks since around November 22, 2021. She has had a lot of callus over this area in general but not an open wound until that time. Subsequently this has more recently opened and has Become a greater concern she is also noted some odor which is disconcerting to her. She tells me that her greatest concern is that "she does not lose this toe". Again I completely understand her concern in this regard I definitely think we need to be aggressive in treating this and try to get things better. She is very Adult nurse. Patient does have a history of diabetes mellitus type 2 with diabetic neuropathy. She also tells me that she does have a history as well of hypertension which is pretty well controlled. She has not yet had an x-ray of the area and has not been on any antibiotics. Both are things I think we will need to do today. 12-20-2021 upon evaluation today patient appears to be doing well with regard to her toe ulcer all things considered. She has  been tolerating the dressing changes without complication. I do have her x-ray as well as her culture for review today the culture did so Staphylococcus aureus no MRSA noted which was great news susceptible to doxycycline which is what I did place her on last week. Overall the odor that she was noticing has also cleared which is great news. With regard to the x-ray there does not appear to be any signs of osteomyelitis or bone erosion at this point which is also great news in general and very pleased with where things stand. 12-27-2021 upon evaluation today patient appears to be doing well with regard to her wound this is actually measuring significantly better which is great news. Overall I am extremely pleased with where we stand today. No fevers, chills, nausea, vomiting, or diarrhea. 01-03-2022 upon evaluation today patient's wound is actually showing signs of significant improvement which is also news. I am very pleased with what we are seeing there is a little bit of callus buildup still but we are going to work on that today otherwise she seems to be making excellent progress which is great news. 01-10-2022 upon evaluation today patient appears to be doing well currently in regard to her toe ulcer. She has been tolerating the dressing changes without complication. Fortunately there does not appear to be any evidence of active infection locally or systemically which is great news. No fevers, chills, nausea, vomiting, or diarrhea. 01-17-2022 upon evaluation today patient's toe was actually doing about the same this week there is not a tremendous improvement in size but overall the appearance of the wound is better it is much flatter I do think the [facility] has done well in that regard. We will still wait to hear back on the skin substitute. With that being said I do believe that the patient could benefit from again the skin substitute to try to help get this to  heal much more effectively and  quickly in the meantime I am trying to help flatten this out which I think is doing a pretty good job.. 6/7; wound on the plantar right great toe over the distal phalanx. Wound is slightly smaller but looks healthier there is rims of epithelialization she still has some Donna [name], Donna [name] ([id number]) [phone].pdf Page 7 of 10 callus on the toe indicative of inadequate pressure/friction relief. I talked to her about making sure in the forefoot offloading boot that the weight stays on her heel in stance phase 01-31-2022 upon evaluation patient's wound is still continuing to show signs of really minimal progress unfortunately. I actually believe that she may need to go into the total contact cast in order to get this to heal and I discussed that with her today as well. Obviously this has been a little bit of a barrier for her not because she does not want to do it and does not want to get healed but rather because it is going to prevent her from being able to drive which is obviously going to be a significant quality-of-life issue with what she does in the position she is in at work. 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 I do believe she could work with the cast on but the issue simply is that she cannot drive to get to work. Otherwise should be able to walk and do what she needs to do. 02-07-2022 upon evaluation today patient appears to be doing well with regard to her wounds. She has been tolerating the dressing changes without complication. Fortunately I do not see any signs of infection although the wound itself is not significantly smaller as of yet. This is something that we are trying to work on I do believe a total contact cast would be the right way to go. I discussed this with the patient today as well. She is actually in agreement with giving this a trial although she is a little nervous about it being  that she is claustrophobic. 6/23; patient presents for obligatory cast exchange. She reports that her heel feels sore from the total contact cast. On that she had no issues. 02-14-2022 upon evaluation today patient's wound is actually showing signs of excellent improvement. There does not appear to be any signs of active infection at this time and overall the toe looks amazing in just 1 week's time. We have come down almost half compared to where we were before size wise and this is also him as well. Overall I think that the casting has done extremely good for her and the toe in general looks significantly improved. I think this week since we want to have to mess with the wound bed itself just the callus around she will likely have even better improvement come next time. 02-21-2022 upon evaluation today patient appears to be doing well with regard to her toe. She is actually showing signs of improvement this is a little bit slower than she would like to see but nonetheless we are seeing significant changes in the right direction in my opinion. Fortunately she is also developing a lot less callus on the end of the toe which is helpful for her as well in my opinion. 02-28-2022 upon evaluation today patient appears to be doing well currently in regard to her wound. She has been tolerating the dressing changes. Fortunately I do not see any signs of  infection I think the cast is doing an awesome job here. No fevers, chills, nausea, vomiting, or diarrhea. 03-07-2022 upon evaluation today patient appears to be doing well with regard to her wound although the collagen is very stuck and dry on the area on the tip of her toe at this point. Nonetheless I discussed with her that I am a little reluctant to put the cast on this week due to what appeared to been a blister along the left side of her toe laterally. Nonetheless this has been a little worried about the possibility of infection and I do not see anything obvious  I think that we may not want to put the cast on this week just to be on the safe side she voiced understanding. With that being said if were not to use the cast she is going to need to make sure to have still some pressure relief I think making gauze bolster with 3 gauze up underneath the toe is going to do a good job here. We will roll and tape this and then subsequently anchored underneath the big toe should be using the front offloading shoe as well. 7/26; patient I know from a previous stay in this clinic. Type II diabetic with a wound on her right plantar great toe. She was in a total contact cast to 2 weeks ago the wound was close to closed last week according to our intake nurse. We put Xeroform and gauze on this that the patient has been changing her self. She has a forefoot offloading boot. 03-21-2022 upon a variable evaluation today patient appears to be doing somewhat poorly in regard to callus buildup at this time. Fortunately I do not see any evidence of overall worsening but this is just still open. No fevers, chills, nausea, vomiting, or diarrhea. I think we may have to go back into the cast next week if this is not significantly improved by that time. 03-28-2022 upon evaluation today patient's wound is actually showing signs of significant improvement which is great news. The cast over the past week he has done a great job. This is so small however but still has a little bit of a deeper area that I am concerned about the possibility of it drying out too much under the cast that is what we saw today and callused over. Subsequently I did remove the callus. 04-11-2022 upon evaluation today patient appears to still have a lot of callus buildup today. She was sick with COVID last weeks has been 2 weeks since have seen her. This is what accounts for some of the callus buildup in my opinion. With that being said she tells me she was not nearly as active working from home with COVID as she can go  in. Nonetheless I do believe still that based on what we are seeing this is still too much friction is likely occurring around the toe area. 04-18-2022 upon evaluation today patient appears to be doing excellent in regard to her wound in fact this appears to be completely healed which is great news. Fortunately I do not see any evidence of active infection locally or systemically at this time which is excellent as well. No fevers, chills, nausea, vomiting, or diarrhea. 06/29/2022 Ms. Marquita Palms is a 56 year old female with a past medical history of uncontrolled type 2 diabetes that presents to the clinic for a wound to the right foot. On 06/02/2022 she presented to the hospital for cellulitis of the right foot and found  to have osteomyelitis of the fifth toe. She had a 5th ray amputation on 10/14. Her culture grew Staph aureus. She was placed on 10 days of linezolid by infectious disease. She has been doing a dry dressing for the past 3-4 weeks. She currently denies systemic signs of infection. 11/17; patient presents for follow-up. She has been using Santyl and silver alginate to the wound bed daily. She reports using Vashe wound cleanser between dressing changes. She is taking doxycycline currently. She has no issues or complaints today. Patient History Information obtained from Patient. Family History Cancer - Father, Diabetes - Mother, Heart Disease - Father,Mother. Social History Never smoker, Marital Status - Single, Alcohol Use - Never, Drug Use - No History, Caffeine Use - Daily. Medical History Cardiovascular Patient has history of Hypertension Endocrine Patient has history of Type II Diabetes Neurologic Patient has history of Neuropathy Hospitalization/Surgery History - right exicision haglund's deformity wih achilles tendon repair 2020. 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 Medical A Surgical History Notes nd Cardiovascular Hyperlipidemia Donna BollmanCURRIN,  Aalani [name] ([id number]) [id number] Page 8 of 10 Objective Constitutional respirations regular, non-labored and within target range for patient.. Vitals Time Taken: 10:34 AM, Temperature: 98.6 F, Pulse: 83 bpm, Respiratory Rate: 17 breaths/min, Blood Pressure: 136/71 mmHg. Cardiovascular 2+ dorsalis pedis/posterior tibialis pulses. Psychiatric pleasant and cooperative. General Notes: Right foot: T the lateral aspect she has a surgical wound status post fifth ray amputation. The wound bed has nonviable tissue and scant o granulation tissue. Slight erythema to the periwound. Integumentary (Hair, Skin) Wound #3 status is Open. Original cause of wound was Surgical Injury. The date acquired was: 05/21/2022. The wound has been in treatment 1 weeks. The wound is located on the Right Amputation Site - T The wound measures 2.5cm length x 1cm width x 0.9cm depth; 1.963cm^2 area and 1.767cm^3 volume. oe. There is Fat Layer (Subcutaneous Tissue) exposed. There is no tunneling or undermining noted. There is a medium amount of serosanguineous drainage noted. The wound margin is distinct with the outline attached to the wound base. There is small (1-33%) red, pink granulation within the wound bed. There is a large (67- 100%) amount of necrotic tissue within the wound bed including Eschar and Adherent Slough. The periwound skin appearance exhibited: Callus. The periwound skin appearance did not exhibit: Crepitus, Excoriation, Induration, Rash, Scarring, Dry/Scaly, Maceration, Atrophie Blanche, Cyanosis, Ecchymosis, Hemosiderin Staining, Mottled, Pallor, Rubor, Erythema. Periwound temperature was noted as No Abnormality. Assessment Active Problems ICD-10 Unspecified open wound, right foot, initial encounter Type 2 diabetes mellitus with foot ulcer Gangrene, not elsewhere classified Osteomyelitis, unspecified Patient's wound has shown improvement in size and appearance since last  clinic visit. I debrided nonviable tissue. I recommended continuing with Santyl and silver alginate with dressing changes. She has more more week of doxycycline left. Continue aggressive offloading with surgical shoe and foam donut. Procedures Wound #3 Pre-procedure diagnosis of Wound #3 is a Diabetic Wound/Ulcer of the Lower Extremity located on the Right Amputation Site - T .Severity of Tissue Pre oe Debridement is: Fat layer exposed. There was a Excisional Skin/Subcutaneous Tissue Debridement with a total area of 2.5 sq cm performed by Geralyn CorwinHoffman, Irelynd Zumstein, DO. With the following instrument(s): Curette to remove Viable and Non-Viable tissue/material. Material removed includes Subcutaneous Tissue and Slough and after achieving pain control using Lidocaine 4% T opical Solution. A time out was conducted at 11:10, prior to the start of the procedure. A Minimum amount of bleeding was controlled with Pressure. The procedure  was tolerated well with a pain level of 0 throughout and a pain level of 0 following the procedure. Post Debridement Measurements: 2.5cm length x 1cm width x 0.9cm depth; 1.767cm^3 volume. Character of Wound/Ulcer Post Debridement requires further debridement. Severity of Tissue Post Debridement is: Fat layer exposed. Post procedure Diagnosis Wound #3: Same as Pre-Procedure Plan Follow-up Appointments: Return Appointment in 2 weeks. - Dr. Mikey Bussing 07/17/2022 Return appointment in 3 weeks. - Dr. Mikey Bussing Anesthetic: (In clinic) Topical Lidocaine 4% applied to wound bed Bathing/ Shower/ Hygiene: May shower with protection but do not get wound dressing(s) wet. Edema Control - Lymphedema / SCD / Other: Elevate legs to the level of the heart or above for 30 minutes daily and/or when sitting, a frequency of: - throughout the day. Avoid standing for long periods of time. Off-LoadingMARYJEAN, Donna [name] ([id number]) [id number] Page 9 of 10 Open toe surgical  shoe to: - office to provide a new surgical shoe. ensure no pressure to right lateral foot. WOUND #3: - Amputation Site - T oe Wound Laterality: Right Cleanser: Vashe 1 x Per Day/30 Days Discharge Instructions: use to cleanse wound daily. Prim Dressing: KerraCel Ag Gelling Fiber Dressing, 4x5 in (silver alginate) (Generic) 1 x Per Day/30 Days ary Discharge Instructions: Apply silver alginate over the santyl. Prim Dressing: Santyl Ointment 1 x Per Day/30 Days ary Discharge Instructions: Apply in office today and use once it arrives at home instead of medihoney. Secondary Dressing: Woven Gauze Sponge, Non-Sterile 4x4 in (Generic) 1 x Per Day/30 Days Discharge Instructions: Apply over primary dressing as directed. Secured With: Insurance underwriter, Sterile 2x75 (in/in) (Generic) 1 x Per Day/30 Days Discharge Instructions: Secure with stretch gauze as directed. Secured With: 70M Medipore H Soft Cloth Surgical T ape, 4 x 10 (in/yd) (Generic) 1 x Per Day/30 Days Discharge Instructions: Secure with tape as directed. 1. In office sharp debridement 2. Santyl and silver alginate 3. Aggressive offloadingoosurgical shoe, offloading foam donut 4. Continue doxycycline 5. Follow-up in 1 to 2 weeks Electronic Signature(s) Signed: 07/09/2022 11:34:26 AM By: Geralyn Corwin DO Entered By: Geralyn Corwin on 07/06/2022 11:28:38 -------------------------------------------------------------------------------- HxROS Details Patient Name: Date of Service: Donna [name]. 07/06/2022 10:15 A M Medical Record Number: [id number] Patient Account Number: [id number] Date of Birth/Sex: Treating RN: 1965/09/03 (56 y.o. F) Primary Care Provider: Hillard Danker Other Clinician: Referring Provider: Treating Provider/Extender: Leandrew Koyanagi in Treatment: 1 Information Obtained From Patient Cardiovascular Medical History: Positive for: Hypertension Past Medical  History Notes: Hyperlipidemia Endocrine Medical History: Positive for: Type II Diabetes Treated with: Insulin, Oral agents Blood sugar tested every day: No Neurologic Medical History: Positive for: Neuropathy Immunizations Pneumococcal Vaccine: Received Pneumococcal Vaccination: No Implantable Devices None Hospitalization / Surgery History Type of Hospitalization/Surgery right exicision haglund's deformity wih achilles tendon repair 2020 BETHAN, ADAMEK [name] ([id number]) [phone].pdf Page 10 of 10 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 Family and Social History Cancer: Yes - Father; Diabetes: Yes - Mother; Heart Disease: Yes - Father,Mother; Never smoker; Marital Status - Single; Alcohol Use: Never; Drug Use: No History; Caffeine Use: Daily; Financial Concerns: No; Food, Clothing or Shelter Needs: No; Support System Lacking: No; Transportation Concerns: No Electronic Signature(s) Signed: 07/09/2022 11:34:26 AM By: Geralyn Corwin DO Entered By: Geralyn Corwin on 07/06/2022 11:26:22 -------------------------------------------------------------------------------- SuperBill Details Patient Name: Date of Service: Donna [name] 07/06/2022 Medical Record Number: [id number] Patient Account Number: [id number] Date of Birth/Sex: Treating RN: [birth year] (56 y.o. [name] Primary Care Provider: Okey Dupre,  Lanora Manis Other Clinician: Referring Provider: Treating Provider/Extender: Leandrew Koyanagi in Treatment: 1 Diagnosis Coding ICD-10 Codes Code Description S91.301A Unspecified open wound, right foot, initial encounter E11.621 Type 2 diabetes mellitus with foot ulcer I96 Gangrene, not elsewhere classified M86.9 Osteomyelitis, unspecified Facility Procedures : CPT4 Code: 16109604 Description: 11042 - DEB SUBQ TISSUE 20 SQ CM/< ICD-10 Diagnosis Description S91.301A Unspecified open wound, right foot,  initial encounter E11.621 Type 2 diabetes mellitus with foot ulcer Modifier: Quantity: 1 Physician Procedures : CPT4 Code Description Modifier [id number] 11042 - WC PHYS SUBQ TISS 20 SQ CM ICD-10 Diagnosis Description S91.301A Unspecified open wound, right foot, initial encounter E11.621 Type 2 diabetes mellitus with foot ulcer Quantity: 1 Electronic Signature(s) Signed: 07/09/2022 11:34:26 AM By: Geralyn Corwin DO Entered By: Geralyn Corwin on 07/06/2022 11:28:48

## 2022-07-17 ENCOUNTER — Encounter (HOSPITAL_BASED_OUTPATIENT_CLINIC_OR_DEPARTMENT_OTHER): Payer: 59 | Admitting: Internal Medicine

## 2022-07-17 DIAGNOSIS — E1152 Type 2 diabetes mellitus with diabetic peripheral angiopathy with gangrene: Secondary | ICD-10-CM | POA: Diagnosis not present

## 2022-07-17 DIAGNOSIS — E1140 Type 2 diabetes mellitus with diabetic neuropathy, unspecified: Secondary | ICD-10-CM | POA: Diagnosis not present

## 2022-07-17 DIAGNOSIS — E11621 Type 2 diabetes mellitus with foot ulcer: Secondary | ICD-10-CM

## 2022-07-17 DIAGNOSIS — S91301A Unspecified open wound, right foot, initial encounter: Secondary | ICD-10-CM | POA: Diagnosis not present

## 2022-07-17 DIAGNOSIS — I10 Essential (primary) hypertension: Secondary | ICD-10-CM | POA: Diagnosis not present

## 2022-07-17 NOTE — Progress Notes (Signed)
Donna [name], Sonda R ([id number]) [id number] Page 1 of 8 Visit Report for 07/17/2022 Arrival Information Details Patient Name: Date of Service: Donna [name], Donna R. 07/17/2022 8:00 A M Medical Record Number: [id number] Patient Account Number: [id number] Date of Birth/Sex: Treating RN: 01/20/1966 (56 y.o. F) Primary Care Talah Cookston: Hillard Dankerrawford, Elizabeth Other Clinician: Referring Leba Tibbitts: Treating Yamili Lichtenwalner/Extender: Leandrew KoyanagiHoffman, Jessica Crawford, Elizabeth Weeks in Treatment: 2 Visit Information History Since Last Visit All ordered tests and consults were completed: No Patient Arrived: Ambulatory Added or deleted any medications: No Arrival Time: 08:20 Any new allergies or adverse reactions: No Accompanied By: self Had a fall or experienced change in No Transfer Assistance: Nurse, adultHoyer Lift activities of daily living that may affect Patient Identification Verified: Yes risk of falls: Secondary Verification Process Completed: Yes Signs or symptoms of abuse/neglect since last visito No Patient Requires Transmission-Based Precautions: No Hospitalized since last visit: No Patient Has Alerts: Yes Implantable device outside of the clinic excluding No Patient Alerts: ABI's: R:1.16 11/2021 cellular tissue based products placed in the center since last visit: Pain Present Now: No Electronic Signature(s) Signed: 07/17/2022 9:55:20 AM By: Dayton Scrapeoss, Aisha Entered By: Dayton Scrapeoss, Aisha on 07/17/2022 08:21:18 -------------------------------------------------------------------------------- Encounter Discharge Information Details Patient Name: Date of Service: Donna BollmanURRIN, Donna R. 07/17/2022 8:00 A M Medical Record Number: [id number] Patient Account Number: [id number] Date of Birth/Sex: Treating RN: 06/05/1966 (56 y.o. Debara PickettF) Deaton, Yvonne KendallBobbi Primary Care Tremar Wickens: Hillard Dankerrawford, Elizabeth Other Clinician: Referring Maurice Fotheringham: Treating Laporcha Marchesi/Extender: Leandrew KoyanagiHoffman, Jessica Crawford, Elizabeth Weeks in  Treatment: 2 Encounter Discharge Information Items Post Procedure Vitals Discharge Condition: Stable Temperature (F): 98 Ambulatory Status: Ambulatory Pulse (bpm): 79 Discharge Destination: Home Respiratory Rate (breaths/min): 20 Transportation: Private Auto Blood Pressure (mmHg): 127/80 Accompanied By: self Schedule Follow-up Appointment: Yes Clinical Summary of Care: Electronic Signature(s) Signed: 07/17/2022 4:47:08 PM By: Shawn Stalleaton, Bobbi RN, BSN Entered By: Shawn Stalleaton, Bobbi on 07/17/2022 08:56:47 Donna BollmanURRIN, Leatrice R ([id number]) [id number] Page 2 of 8 -------------------------------------------------------------------------------- Lower Extremity Assessment Details Patient Name: Date of Service: Donna [name], Donna R. 07/17/2022 8:00 A M Medical Record Number: [id number] Patient Account Number: [id number] Date of Birth/Sex: Treating RN: 03/30/1966 (56 y.o. Arta SilenceF) Deaton, Bobbi Primary Care Leeyah Heather: Hillard Dankerrawford, Elizabeth Other Clinician: Referring Kaleeyah Cuffie: Treating Harmon Bommarito/Extender: Leandrew KoyanagiHoffman, Jessica Crawford, Elizabeth Weeks in Treatment: 2 Edema Assessment Assessed: [Left: No] [Right: Yes] Edema: [Left: N] [Right: o] Calf Left: Right: Point of Measurement: 31 cm From Medial Instep 34 cm Ankle Left: Right: Point of Measurement: 11 cm From Medial Instep 22 cm Vascular Assessment Pulses: Dorsalis Pedis Palpable: [Right:Yes] Electronic Signature(s) Signed: 07/17/2022 4:47:08 PM By: Shawn Stalleaton, Bobbi RN, BSN Entered By: Shawn Stalleaton, Bobbi on 07/17/2022 08:32:59 -------------------------------------------------------------------------------- Multi Wound Chart Details Patient Name: Date of Service: Donna BollmanURRIN, Donna R. 07/17/2022 8:00 A M Medical Record Number: [id number] Patient Account Number: [id number] Date of Birth/Sex: Treating RN: 10/24/1965 (56 y.o. F) Primary Care Tamani Durney: Hillard Dankerrawford, Elizabeth Other Clinician: Referring Contessa Preuss: Treating Augustine Leverette/Extender:  Leandrew KoyanagiHoffman, Jessica Crawford, Elizabeth Weeks in Treatment: 2 Vital Signs Height(in): Pulse(bpm): 79 Weight(lbs): Blood Pressure(mmHg): 127/80 Body Mass Index(BMI): Temperature(F): 98.0 Respiratory Rate(breaths/min): 20 [3:Photos:] [N/A:N/A] Right Amputation Site - T oe N/A N/A Wound Location: Surgical Injury N/A N/A Wounding Event: Diabetic Wound/Ulcer of the Lower N/A N/A Primary Etiology: Extremity Open Surgical Wound N/A N/A Secondary Etiology: Hypertension, Type II Diabetes, N/A N/A Comorbid History: Neuropathy 05/21/2022 N/A N/A Date Acquired: 2 N/A N/A Weeks of Treatment: Open N/A N/A Wound Status: No N/A N/A Wound Recurrence: 1.8x0.5x0.1 N/A N/A Measurements L x W x D (cm) 0.707 N/A N/A A (cm) : rea 0.071 N/A N/A Volume (  cm) : 85.60% N/A N/A % Reduction in A rea: 98.40% N/A N/A % Reduction in Volume: Grade 4 N/A N/A Classification: Medium N/A N/A Exudate A mount: Serosanguineous N/A N/A Exudate Type: red, brown N/A N/A Exudate Color: Distinct, outline attached N/A N/A Wound Margin: Medium (34-66%) N/A N/A Granulation A mount: Red, Pink N/A N/A Granulation Quality: Medium (34-66%) N/A N/A Necrotic A mount: Fat Layer (Subcutaneous Tissue): Yes N/A N/A Exposed Structures: Fascia: No Tendon: No Muscle: No Joint: No Bone: No None N/A N/A Epithelialization: Debridement - Excisional N/A N/A Debridement: Pre-procedure Verification/Time Out 08:45 N/A N/A Taken: Lidocaine 4% Topical Solution N/A N/A Pain Control: Subcutaneous, Slough N/A N/A Tissue Debrided: Skin/Subcutaneous Tissue N/A N/A Level: 0.9 N/A N/A Debridement A (sq cm): rea Curette N/A N/A Instrument: Minimum N/A N/A Bleeding: Pressure N/A N/A Hemostasis A chieved: 0 N/A N/A Procedural Pain: 0 N/A N/A Post Procedural Pain: Procedure was tolerated well N/A N/A Debridement Treatment Response: 1.8x0.5x0.1 N/A N/A Post Debridement Measurements L x W x D (cm) 0.071  N/A N/A Post Debridement Volume: (cm) Callus: Yes N/A N/A Periwound Skin Texture: Excoriation: No Induration: No Crepitus: No Rash: No Scarring: No Maceration: No N/A N/A Periwound Skin Moisture: Dry/Scaly: No Atrophie Blanche: No N/A N/A Periwound Skin Color: Cyanosis: No Ecchymosis: No Erythema: No Hemosiderin Staining: No Mottled: No Pallor: No Rubor: No No Abnormality N/A N/A Temperature: Debridement N/A N/A Procedures Performed: Treatment Notes Wound #3 (Amputation Site - Toe) Wound Laterality: Right Cleanser Vashe Discharge Instruction: use to cleanse wound daily. Peri-Wound Care Topical Primary Dressing KerraCel Ag Gelling Fiber Dressing, 4x5 in (silver alginate) Discharge Instruction: Apply silver alginate over the santyl. Santyl Ointment Discharge Instruction: Apply in office today and use once it arrives at home instead of medihoney. TYREA, FROBERG ([id number]) [id number] Page 4 of 8 Secondary Dressing Woven Gauze Sponge, Non-Sterile 4x4 in Discharge Instruction: Apply over primary dressing as directed. Secured With Conforming Stretch Gauze Bandage, Sterile 2x75 (in/in) Discharge Instruction: Secure with stretch gauze as directed. 40M Medipore H Soft Cloth Surgical T ape, 4 x 10 (in/yd) Discharge Instruction: Secure with tape as directed. Compression Wrap Compression Stockings Add-Ons Electronic Signature(s) Signed: 07/17/2022 11:39:14 AM By: Geralyn Corwin DO Entered By: Geralyn Corwin on 07/17/2022 09:15:38 -------------------------------------------------------------------------------- Multi-Disciplinary Care Plan Details Patient Name: Date of Service: Donna [name]. 07/17/2022 8:00 A M Medical Record Number: [id number] Patient Account Number: [id number] Date of Birth/Sex: Treating RN: October 13, 1965 (56 y.o. [name], [name] Primary Care Christophor Eick: Hillard Danker Other Clinician: Referring Maeli Spacek: Treating  Raul Torrance/Extender: Leandrew Koyanagi in Treatment: 2 Active Inactive Necrotic Tissue Nursing Diagnoses: Impaired tissue integrity related to necrotic/devitalized tissue Goals: Necrotic/devitalized tissue will be minimized in the wound bed Date Initiated: 06/29/2022 Target Resolution Date: 07/27/2022 Goal Status: Active Patient/caregiver will verbalize understanding of reason and process for debridement of necrotic tissue Date Initiated: 06/29/2022 Target Resolution Date: 07/26/2022 Goal Status: Active Interventions: Assess patient pain level pre-, during and post procedure and prior to discharge Provide education on necrotic tissue and debridement process Treatment Activities: Apply topical anesthetic as ordered : 06/29/2022 Notes: Pain, Acute or Chronic Nursing Diagnoses: Potential alteration in comfort, pain Goals: Patient will verbalize adequate pain control and receive pain control interventions during procedures as needed Date Initiated: 06/29/2022 Target Resolution Date: 07/26/2022 Goal Status: Active Interventions: Complete pain assessment as per visit requirements DOCIA, KLAR ([id number]) [phone].pdf Page 5 of 8 Encourage patient to take pain medications as prescribed Provide education on pain management Treatment Activities: Administer pain  control measures as ordered : 06/29/2022 Notes: Wound/Skin Impairment Nursing Diagnoses: Knowledge deficit related to ulceration/compromised skin integrity Goals: Patient/caregiver will verbalize understanding of skin care regimen Date Initiated: 06/29/2022 Target Resolution Date: 07/26/2022 Goal Status: Active Interventions: Assess patient/caregiver ability to perform ulcer/skin care regimen upon admission and as needed Assess ulceration(s) every visit Provide education on ulcer and skin care Treatment Activities: Skin care regimen initiated : 06/29/2022 Topical wound  management initiated : 06/29/2022 Notes: Electronic Signature(s) Signed: 07/17/2022 4:47:08 PM By: Shawn Stall RN, BSN Entered By: Shawn Stall on 07/17/2022 08:22:38 -------------------------------------------------------------------------------- Pain Assessment Details Patient Name: Date of Service: Donna [name] 07/17/2022 8:00 A M Medical Record Number: [id number] Patient Account Number: [id number] Date of Birth/Sex: Treating RN: [birth year] (56 y.o. F) Primary Care Calvert Charland: Hillard Danker Other Clinician: Referring Rylea Selway: Treating Monea Pesantez/Extender: Leandrew Koyanagi in Treatment: 2 Active Problems Location of Pain Severity and Description of Pain Patient Has Paino No Site Locations Pain Management and Medication Current Pain ManagementONNA, NODAL ([id number]) [id number].pdf Page 6 of 8 Electronic Signature(s) Signed: 07/17/2022 9:55:20 AM By: Dayton Scrape Entered By: Dayton Scrape on 07/17/2022 08:22:18 -------------------------------------------------------------------------------- Patient/Caregiver Education Details Patient Name: Date of Service: LUCRETIA, PENDLEY 11/28/2023andnbsp8:00 A M Medical Record Number: [id number] Patient Account Number: [id number] Date of Birth/Gender: Treating RN: 1966/07/17 (56 y.o. [name] Primary Care Physician: Hillard Danker Other Clinician: Referring Physician: Treating Physician/Extender: Leandrew Koyanagi in Treatment: 2 Education Assessment Education Provided To: Patient Education Topics Provided Wound/Skin Impairment: Handouts: Skin Care Do's and Dont's Methods: Explain/Verbal Responses: Reinforcements needed Electronic Signature(s) Signed: 07/17/2022 4:47:08 PM By: Shawn Stall RN, BSN Entered By: Shawn Stall on 07/17/2022  08:22:48 -------------------------------------------------------------------------------- Wound Assessment Details Patient Name: Date of Service: Donna [name] 07/17/2022 8:00 A M Medical Record Number: [id number] Patient Account Number: [id number] Date of Birth/Sex: Treating RN: 1966-02-09 (56 y.o. F) Primary Care Elandra Powell: Hillard Danker Other Clinician: Referring Bryanah Sidell: Treating Nicholus Chandran/Extender: Leandrew Koyanagi in Treatment: 2 Wound Status Wound Number: 3 Primary Etiology: Diabetic Wound/Ulcer of the Lower Extremity Wound Location: Right Amputation Site - Toe Secondary Etiology: Open Surgical Wound Wounding Event: Surgical Injury Wound Status: Open Date Acquired: 05/21/2022 Comorbid History: Hypertension, Type II Diabetes, Neuropathy Weeks Of Treatment: 2 Clustered Wound: No Photos KAMYIAH, COLANTONIO R ([id number]) [phone].pdf Page 7 of 8 Wound Measurements Length: (cm) 1.8 Width: (cm) 0.5 Depth: (cm) 0.1 Area: (cm) 0.707 Volume: (cm) 0.071 % Reduction in Area: 85.6% % Reduction in Volume: 98.4% Epithelialization: None Tunneling: No Undermining: No Wound Description Classification: Grade 4 Wound Margin: Distinct, outline attached Exudate Amount: Medium Exudate Type: Serosanguineous Exudate Color: red, brown Foul Odor After Cleansing: No Slough/Fibrino Yes Wound Bed Granulation Amount: Medium (34-66%) Exposed Structure Granulation Quality: Red, Pink Fascia Exposed: No Necrotic Amount: Medium (34-66%) Fat Layer (Subcutaneous Tissue) Exposed: Yes Necrotic Quality: Adherent Slough Tendon Exposed: No Muscle Exposed: No Joint Exposed: No Bone Exposed: No Periwound Skin Texture Texture Color No Abnormalities Noted: No No Abnormalities Noted: No Callus: Yes Atrophie Blanche: No Crepitus: No Cyanosis: No Excoriation: No Ecchymosis: No Induration: No Erythema: No Rash: No Hemosiderin Staining:  No Scarring: No Mottled: No Pallor: No Moisture Rubor: No No Abnormalities Noted: No Dry / Scaly: No Temperature / Pain Maceration: No Temperature: No Abnormality Treatment Notes Wound #3 (Amputation Site - Toe) Wound Laterality: Right Cleanser Vashe Discharge Instruction: use to cleanse wound daily. Peri-Wound Care Topical Primary Dressing KerraCel Ag Gelling Fiber Dressing, 4x5 in (silver alginate) Discharge Instruction: Apply  silver alginate over the santyl. Santyl Ointment Discharge Instruction: Apply in office today and use once it arrives at home instead of medihoney. Secondary Dressing Woven Gauze Sponge, Non-Sterile 4x4 in Discharge Instruction: Apply over primary dressing as directed. Secured With [employer], Sterile 2x75 (in/in) Graffeo, Navil R ([id number]) [phone].pdf Page 8 of 8 Discharge Instruction: Secure with stretch gauze as directed. 70M Medipore H Soft Cloth Surgical T ape, 4 x 10 (in/yd) Discharge Instruction: Secure with tape as directed. Compression Wrap Compression Stockings Add-Ons Electronic Signature(s) Signed: 07/17/2022 4:47:08 PM By: Shawn Stall RN, BSN Entered By: Shawn Stall on 07/17/2022 08:33:26 -------------------------------------------------------------------------------- Vitals Details Patient Name: Date of Service: Donna [name]. 07/17/2022 8:00 A M Medical Record Number: [id number] Patient Account Number: [id number] Date of Birth/Sex: Treating RN: 1965/11/15 (56 y.o. F) Primary Care Demetrice Combes: Hillard Danker Other Clinician: Referring Kirti Carl: Treating Nanetta Wiegman/Extender: Leandrew Koyanagi in Treatment: 2 Vital Signs Time Taken: 08:15 Temperature (F): 98.0 Pulse (bpm): 79 Respiratory Rate (breaths/min): 20 Blood Pressure (mmHg): 127/80 Reference Range: 80 - 120 mg / dl Electronic Signature(s) Signed: 07/17/2022 9:55:20 AM By: Dayton Scrape Entered By: Dayton Scrape on 07/17/2022 08:22:09

## 2022-07-17 NOTE — Progress Notes (Signed)
ROTHA, CASSELS ([id number]) [id number] Page 1 of 10 Visit Report for 07/17/2022 Chief Complaint Document Details Patient Name: Date of Service: Donna [name], Donna [name] 07/17/2022 8:00 A M Medical Record Number: [id number] Patient Account Number: [id number] Date of Birth/Sex: Treating RN: [birth year] (56 y.o. F) Primary Care Provider: Hillard Danker Other Clinician: Referring Provider: Treating Provider/Extender: Leandrew Koyanagi in Treatment: 2 Information Obtained from: Patient Chief Complaint 06/29/2022; surgical wound to the right foot status post right fifth ray amputation Electronic Signature(s) Signed: 07/17/2022 11:39:14 AM By: Geralyn Corwin DO Entered By: Geralyn Corwin on 07/17/2022 09:15:45 -------------------------------------------------------------------------------- Debridement Details Patient Name: Date of Service: Donna Bollman. 07/17/2022 8:00 A M Medical Record Number: [id number] Patient Account Number: [id number] Date of Birth/Sex: Treating RN: [birth year] (56 y.o. Donna [name], Millard.Loa Primary Care Provider: Hillard Danker Other Clinician: Referring Provider: Treating Provider/Extender: Leandrew Koyanagi in Treatment: 2 Debridement Performed for Assessment: Wound #3 Right Amputation Site - Toe Performed By: Physician Geralyn Corwin, DO Debridement Type: Debridement Severity of Tissue Pre Debridement: Fat layer exposed Level of Consciousness (Pre-procedure): Awake and Alert Pre-procedure Verification/Time Out Yes - 08:45 Taken: Start Time: 08:46 Pain Control: Lidocaine 4% T opical Solution T Area Debrided (L x W): otal 1.8 (cm) x 0.5 (cm) = 0.9 (cm) Tissue and other material debrided: Viable, Non-Viable, Slough, Subcutaneous, Skin: Dermis , Skin: Epidermis, Slough Level: Skin/Subcutaneous Tissue Debridement Description: Excisional Instrument: Curette Bleeding:  Minimum Hemostasis Achieved: Pressure End Time: 08:54 Procedural Pain: 0 Post Procedural Pain: 0 Response to Treatment: Procedure was tolerated well Level of Consciousness (Post- Awake and Alert procedure): Post Debridement Measurements of Total Wound Length: (cm) 1.8 Width: (cm) 0.5 Depth: (cm) 0.1 Volume: (cm) 0.071 Character of Wound/Ulcer Post Debridement: Improved Severity of Tissue Post Debridement: Fat layer exposed Donna [name], Donna [name] ([id number]) [id number] Page 2 of 10 Post Procedure Diagnosis Same as Pre-procedure Electronic Signature(s) Signed: 07/17/2022 11:39:14 AM By: Geralyn Corwin DO Signed: 07/17/2022 4:47:08 PM By: Shawn Stall RN, BSN Entered By: Shawn Stall on 07/17/2022 08:55:01 -------------------------------------------------------------------------------- HPI Details Patient Name: Date of Service: Donna Bollman. 07/17/2022 8:00 A M Medical Record Number: [id number] Patient Account Number: [id number] Date of Birth/Sex: Treating RN: [birth year] (56 y.o. 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 She tells me that since at least last summer she has had a progressive callused area that has opened closed but is never really healed. She has a history of calluses on her feet. She is a diabetic with neuropathy. She was referred to Dr. Allena Katz at [facility] who felt that this might be a large plantar wart she was treated with laser for 2 treatments and then subsequently with ammonium lactate none of this is apparently made any difference according to the patient. She has a very odd looking area with thick skin and callus and an almost crossed like  area of open tissue. She also has erythema above this which she says is chronic. The other interesting part of her recent history is recurrent cellulitis on the right lower leg although this is separated from the area we are looking at. She has had trips to see infectious disease has been on Keflex for as long as 20 days. This is presumed to be strep. She saw Dr. Renold Don Past medical history  includes type 2 diabetes, recurrent cellulitis of the right leg, right Achilles tendon repair a year ago for spontaneous rupture ABI in our clinic was 1.09 on the right 2/17; patient admitted the clinic last week had a thick callused area on the right lateral heel she is a diabetic with neuropathy. She had thick callus and across like area of open wound. I aggressively debrided this area to remove most of the callus and gave her Hydrofera Blue and heel off loader, heel cup and she has been wearing this religiously trying to keep the pressure off the area. She comes in today with a cross-like area of injury has healed she has a new open area that I do not remember from last time. Still some dry flaking skin and callus but not nearly as much as last week at which time I had to use a scalpel to remove this. 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 She came in 2 weeks ago with a very thick hyperkeratotic callused area on her right lateral heel. There was a "crossed" shaped area of split tissue with an open wound at the bottom. This had previously been treated as a possible wart without improvement. I removed all of the callus when she first came in here. This is mostly epithelialized over still thick skin but nothing like it was. She has 1 small open area that has not healed she has been more religious about offloading the area 10/27/2020 upon evaluation today patient appears to be doing well with regard to her wound although she does still apparently appear to have something still open in the  base of the wound. Fortunately there is no evidence of infection at this time which is great news and in general I feel like she is doing decently well though she has a lot of callus buildup she is using the offloading shoe for her heel. At home she does not always wear this she tells me. Generally she is using flip-flops there. Of note her primary care provider did give her Keflex yesterday for cellulitis of this leg but has nothing to do with the heel location. 3/17; the patient's area actually looks a lot better. There is no open wound here. Much less callus. She was given a prescription for 40% urea cream I am not sure that really is required this week. She is going to require less friction on this area. I think she is going to need a different type of shoe going forward Readmission: 12-13-2028 upon evaluation today patient presents for reevaluation in the clinic she was last seen by Dr. Leanord Hawking on 11-03-2020 when she was discharged this was due to a crack on her heel. Currently she is actually having an issue with her great toe on the right foot. She tells me that this has been present currently for a couple of weeks since around November 22, 2021. She has had a lot of callus over this area in general but not an open wound until that time. Subsequently this has more recently opened and has Become a greater concern she is also noted some odor which is disconcerting to her. She tells me that her greatest concern is that "she does not lose this toe". Again I completely understand her concern in this regard I definitely think we need to be aggressive in treating this and try to get things better. She is very Adult nurse. Patient does have a history of diabetes mellitus type 2 with diabetic neuropathy.  She also tells me that she does have a history as well of hypertension which is pretty well controlled. She has not yet had an x-ray of the area and has not been on any antibiotics. Both are things I think we  will need to do today. 12-20-2021 upon evaluation today patient appears to be doing well with regard to her toe ulcer all things considered. She has been tolerating the dressing changes without complication. I do have her x-ray as well as her culture for review today the culture did so Staphylococcus aureus no MRSA noted which was great news susceptible to doxycycline which is what I did place her on last week. Overall the odor that she was noticing has also cleared which is great news. With regard to the x-ray there does not appear to be any signs of osteomyelitis or bone erosion at this point which is also great news in general and very pleased with where things stand. 12-27-2021 upon evaluation today patient appears to be doing well with regard to her wound this is actually measuring significantly better which is great news. Overall I am extremely pleased with where we stand today. No fevers, chills, nausea, vomiting, or diarrhea. 01-03-2022 upon evaluation today patient's wound is actually showing signs of significant improvement which is also news. I am very pleased with what we are seeing there is a little bit of callus buildup still but we are going to work on that today otherwise she seems to be making excellent progress which is great news. Donna [name], Donna [name] ([id number]) [id number] Page 3 of 10 01-10-2022 upon evaluation today patient appears to be doing well currently in regard to her toe ulcer. She has been tolerating the dressing changes without complication. Fortunately there does not appear to be any evidence of active infection locally or systemically which is great news. No fevers, chills, nausea, vomiting, or diarrhea. 01-17-2022 upon evaluation today patient's toe was actually doing about the same this week there is not a tremendous improvement in size but overall the appearance of the wound is better it is much flatter I do think the [facility] has done  well in that regard. We will still wait to hear back on the skin substitute. With that being said I do believe that the patient could benefit from again the skin substitute to try to help get this to heal much more effectively and quickly in the meantime I am trying to help flatten this out which I think is doing a pretty good job.. 6/7; wound on the plantar right great toe over the distal phalanx. Wound is slightly smaller but looks healthier there is rims of epithelialization she still has some callus on the toe indicative of inadequate pressure/friction relief. I talked to her about making sure in the forefoot offloading boot that the weight stays on her heel in stance phase 01-31-2022 upon evaluation patient's wound is still continuing to show signs of really minimal progress unfortunately. I actually believe that she may need to go into the total contact cast in order to get this to heal and I discussed that with her today as well. Obviously this has been a little bit of a barrier for her not because she does not want to do it and does not want to get healed but rather because it is going to prevent her from being able to drive which is obviously going to be a significant quality-of-life issue with what she does in the position she is in at  work. 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 I do believe she could work with the cast on but the issue simply is that she cannot drive to get to work. Otherwise should be able to walk and do what she needs to do. 02-07-2022 upon evaluation today patient appears to be doing well with regard to her wounds. She has been tolerating the dressing changes without complication. Fortunately I do not see any signs of infection although the wound itself is not significantly smaller as of yet. This is something that we are trying to work on I do believe a total contact cast would be the right way to go. I  discussed this with the patient today as well. She is actually in agreement with giving this a trial although she is a little nervous about it being that she is claustrophobic. 6/23; patient presents for obligatory cast exchange. She reports that her heel feels sore from the total contact cast. On that she had no issues. 02-14-2022 upon evaluation today patient's wound is actually showing signs of excellent improvement. There does not appear to be any signs of active infection at this time and overall the toe looks amazing in just 1 week's time. We have come down almost half compared to where we were before size wise and this is also him as well. Overall I think that the casting has done extremely good for her and the toe in general looks significantly improved. I think this week since we want to have to mess with the wound bed itself just the callus around she will likely have even better improvement come next time. 02-21-2022 upon evaluation today patient appears to be doing well with regard to her toe. She is actually showing signs of improvement this is a little bit slower than she would like to see but nonetheless we are seeing significant changes in the right direction in my opinion. Fortunately she is also developing a lot less callus on the end of the toe which is helpful for her as well in my opinion. 02-28-2022 upon evaluation today patient appears to be doing well currently in regard to her wound. She has been tolerating the dressing changes. Fortunately I do not see any signs of infection I think the cast is doing an awesome job here. No fevers, chills, nausea, vomiting, or diarrhea. 03-07-2022 upon evaluation today patient appears to be doing well with regard to her wound although the collagen is very stuck and dry on the area on the tip of her toe at this point. Nonetheless I discussed with her that I am a little reluctant to put the cast on this week due to what appeared to been a blister  along the left side of her toe laterally. Nonetheless this has been a little worried about the possibility of infection and I do not see anything obvious I think that we may not want to put the cast on this week just to be on the safe side she voiced understanding. With that being said if were not to use the cast she is going to need to make sure to have still some pressure relief I think making gauze bolster with 3 gauze up underneath the toe is going to do a good job here. We will roll and tape this and then subsequently anchored underneath the big toe should be using the front offloading shoe as well. 7/26; patient I know from  a previous stay in this clinic. Type II diabetic with a wound on her right plantar great toe. She was in a total contact cast to 2 weeks ago the wound was close to closed last week according to our intake nurse. We put Xeroform and gauze on this that the patient has been changing her self. She has a forefoot offloading boot. 03-21-2022 upon a variable evaluation today patient appears to be doing somewhat poorly in regard to callus buildup at this time. Fortunately I do not see any evidence of overall worsening but this is just still open. No fevers, chills, nausea, vomiting, or diarrhea. I think we may have to go back into the cast next week if this is not significantly improved by that time. 03-28-2022 upon evaluation today patient's wound is actually showing signs of significant improvement which is great news. The cast over the past week he has done a great job. This is so small however but still has a little bit of a deeper area that I am concerned about the possibility of it drying out too much under the cast that is what we saw today and callused over. Subsequently I did remove the callus. 04-11-2022 upon evaluation today patient appears to still have a lot of callus buildup today. She was sick with COVID last weeks has been 2 weeks since have seen her. This is what accounts  for some of the callus buildup in my opinion. With that being said she tells me she was not nearly as active working from home with COVID as she can go in. Nonetheless I do believe still that based on what we are seeing this is still too much friction is likely occurring around the toe area. 04-18-2022 upon evaluation today patient appears to be doing excellent in regard to her wound in fact this appears to be completely healed which is great news. Fortunately I do not see any evidence of active infection locally or systemically at this time which is excellent as well. No fevers, chills, nausea, vomiting, or diarrhea. 06/29/2022 Ms. Marquita Palms is a 56 year old female with a past medical history of uncontrolled type 2 diabetes that presents to the clinic for a wound to the right foot. On 06/02/2022 she presented to the hospital for cellulitis of the right foot and found to have osteomyelitis of the fifth toe. She had a 5th ray amputation on 10/14. Her culture grew Staph aureus. She was placed on 10 days of linezolid by infectious disease. She has been doing a dry dressing for the past 3-4 weeks. She currently denies systemic signs of infection. 11/17; patient presents for follow-up. She has been using Santyl and silver alginate to the wound bed daily. She reports using Vashe wound cleanser between dressing changes. She is taking doxycycline currently. She has no issues or complaints today. 11/28; patient presents for follow-up. She has been using Santyl and silver alginate to the wound bed without issues. She completed her course of doxycycline. She denies signs of infection. Electronic Signature(s) Signed: 07/17/2022 11:39:14 AM By: Geralyn Corwin DO Entered By: Geralyn Corwin on 07/17/2022 09:16:08 Donna Bollman ([id number]) [id number] Page 4 of 10 -------------------------------------------------------------------------------- Physical Exam  Details Patient Name: Date of Service: Donna [name], Donna [name] 07/17/2022 8:00 A M Medical Record Number: [id number] Patient Account Number: [id number] Date of Birth/Sex: Treating RN: [birth year] (56 y.o. F) Primary Care Provider: Hillard Danker Other Clinician: Referring Provider: Treating Provider/Extender: Leandrew Koyanagi in Treatment: 2 Constitutional respirations regular, non-labored and within  target range for patient.. Cardiovascular 2+ dorsalis pedis/posterior tibialis pulses. Psychiatric pleasant and cooperative. Notes Right foot: T the lateral aspect she has a surgical wound status post fifth ray amputation. The wound bed has nonviable tissue and scant granulation tissue. o No erythema to the periwound Electronic Signature(s) Signed: 07/17/2022 11:39:14 AM By: Geralyn Corwin DO Entered By: Geralyn Corwin on 07/17/2022 09:16:44 -------------------------------------------------------------------------------- Physician Orders Details Patient Name: Date of Service: Donna Bollman. 07/17/2022 8:00 A M Medical Record Number: [id number] Patient Account Number: [id number] Date of Birth/Sex: Treating RN: 09-19-1965 (56 y.o. Donna [name], Donna [name] Primary Care Provider: Hillard Danker Other Clinician: Referring Provider: Treating Provider/Extender: Leandrew Koyanagi in Treatment: 2 Verbal / Phone Orders: No Diagnosis Coding ICD-10 Coding Code Description S91.301A Unspecified open wound, right foot, initial encounter E11.621 Type 2 diabetes mellitus with foot ulcer I96 Gangrene, not elsewhere classified M86.9 Osteomyelitis, unspecified Follow-up Appointments ppointment in 1 week. - Dr. Mikey Bussing Return A ppointment in 2 weeks. - Dr. Mikey Bussing Return A Anesthetic (In clinic) Topical Lidocaine 4% applied to wound bed Bathing/ Shower/ Hygiene May shower with protection but do not get wound dressing(s) wet. Edema Control  - Lymphedema / SCD / Other Elevate legs to the level of the heart or above for 30 minutes daily and/or when sitting, a frequency of: - throughout the day. Avoid standing for long periods of time. Off-Loading CHARLISA, CHAM ([id number]) [phone].pdf Page 5 of 10 Open toe surgical shoe to: - office to provide a new surgical shoe. ensure no pressure to right lateral foot. Wound Treatment Wound #3 - Amputation Site - Toe Wound Laterality: Right Cleanser: Vashe 1 x Per Day/30 Days Discharge Instructions: use to cleanse wound daily. Prim Dressing: KerraCel Ag Gelling Fiber Dressing, 4x5 in (silver alginate) (Generic) 1 x Per Day/30 Days ary Discharge Instructions: Apply silver alginate over the santyl. Prim Dressing: Santyl Ointment 1 x Per Day/30 Days ary Discharge Instructions: Apply in office today and use once it arrives at home instead of medihoney. Secondary Dressing: Woven Gauze Sponge, Non-Sterile 4x4 in (Generic) 1 x Per Day/30 Days Discharge Instructions: Apply over primary dressing as directed. Secured With: Insurance underwriter, Sterile 2x75 (in/in) (Generic) 1 x Per Day/30 Days Discharge Instructions: Secure with stretch gauze as directed. Secured With: 58M Medipore H Soft Cloth Surgical T ape, 4 x 10 (in/yd) (Generic) 1 x Per Day/30 Days Discharge Instructions: Secure with tape as directed. Electronic Signature(s) Signed: 07/17/2022 11:39:14 AM By: Geralyn Corwin DO Entered By: Geralyn Corwin on 07/17/2022 09:16:51 -------------------------------------------------------------------------------- Problem List Details Patient Name: Date of Service: Donna Bollman. 07/17/2022 8:00 A M Medical Record Number: [id number] Patient Account Number: [id number] Date of Birth/Sex: Treating RN: 09-20-1965 (56 y.o. Donna [name], Donna [name] Primary Care Provider: Hillard Danker Other Clinician: Referring Provider: Treating Provider/Extender: Leandrew Koyanagi in Treatment: 2 Active Problems ICD-10 Encounter Code Description Active Date MDM Diagnosis S91.301A Unspecified open wound, right foot, initial encounter 06/29/2022 No Yes E11.621 Type 2 diabetes mellitus with foot ulcer 06/29/2022 No Yes I96 Gangrene, not elsewhere classified 06/29/2022 No Yes M86.9 Osteomyelitis, unspecified 06/29/2022 No Yes Inactive Problems Resolved Problems Electronic Signature(s) Signed: 07/17/2022 11:39:14 AM By: Geralyn Corwin DO Entered By: Geralyn Corwin on 07/17/2022 09:15:32 Donna Bollman ([id number]) [id number] Page 6 of 10 -------------------------------------------------------------------------------- Progress Note Details Patient Name: Date of Service: Donna [name], Donna [name] 07/17/2022 8:00 A M Medical Record Number: [id number] Patient Account Number: [id number] Date of Birth/Sex: Treating RN: [birth year] (56 y.o. 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 She tells me that since at least last summer she has had a progressive callused area that has opened closed but is never really healed. She has a history of calluses on her feet. She is a diabetic with neuropathy. She was referred to Dr. Allena Katz at [facility] who felt that this might be a large plantar wart she was treated with laser for 2 treatments and then subsequently with ammonium lactate none of this is apparently made any difference according to the patient. She has a very odd looking area with thick skin and  callus and an almost crossed like area of open tissue. She also has erythema above this which she says is chronic. The other interesting part of her recent history is recurrent cellulitis on the right lower leg although this is separated from the area we are looking at. She has had trips to see infectious disease has been on Keflex for as long as 20 days. This is presumed to be strep. She saw Dr. Renold Don Past medical history includes type 2 diabetes, recurrent cellulitis of the right leg, right Achilles tendon repair a year ago for spontaneous rupture ABI in our clinic was 1.09 on the right 2/17; patient admitted the clinic last week had a thick callused area on the right lateral heel she is a diabetic with neuropathy. She had thick callus and across like area of open wound. I aggressively debrided this area to remove most of the callus and gave her Hydrofera Blue and heel off loader, heel cup and she has been wearing this religiously trying to keep the pressure off the area. She comes in today with a cross-like area of injury has healed she has a new open area that I do not remember from last time. Still some dry flaking skin and callus but not nearly as much as last week at which time I had to use a scalpel to remove this. 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 She came in 2 weeks ago with a very thick hyperkeratotic callused area on her right lateral heel. There was a "crossed" shaped area of split tissue with an open wound at the bottom. This had previously been treated as a possible wart without improvement. I removed all of the callus when she first came in here. This is mostly epithelialized over still thick skin but nothing like it was. She has 1 small open area that has not healed she has been more religious about offloading the area 10/27/2020 upon evaluation today patient appears to be doing well with regard to her wound although she does still apparently appear to  have something still open in the base of the wound. Fortunately there is no evidence of infection at this time which is great news and in general I feel like she is doing decently well though she has a lot of callus buildup she is using the offloading shoe for her heel. At home she does not always wear this she tells me. Generally she is using flip-flops there. Of note her primary care provider did give her Keflex yesterday for cellulitis of this leg but has  nothing to do with the heel location. 3/17; the patient's area actually looks a lot better. There is no open wound here. Much less callus. She was given a prescription for 40% urea cream I am not sure that really is required this week. She is going to require less friction on this area. I think she is going to need a different type of shoe going forward Readmission: 12-13-2028 upon evaluation today patient presents for reevaluation in the clinic she was last seen by Dr. Leanord Hawkingobson on 11-03-2020 when she was discharged this was due to a crack on her heel. Currently she is actually having an issue with her great toe on the right foot. She tells me that this has been present currently for a couple of weeks since around November 22, 2021. She has had a lot of callus over this area in general but not an open wound until that time. Subsequently this has more recently opened and has Become a greater concern she is also noted some odor which is disconcerting to her. She tells me that her greatest concern is that "she does not lose this toe". Again I completely understand her concern in this regard I definitely think we need to be aggressive in treating this and try to get things better. She is very Adult nurseappreciative. Patient does have a history of diabetes mellitus type 2 with diabetic neuropathy. She also tells me that she does have a history as well of hypertension which is pretty well controlled. She has not yet had an x-ray of the area and has not been on any  antibiotics. Both are things I think we will need to do today. 12-20-2021 upon evaluation today patient appears to be doing well with regard to her toe ulcer all things considered. She has been tolerating the dressing changes without complication. I do have her x-ray as well as her culture for review today the culture did so Staphylococcus aureus no MRSA noted which was great news susceptible to doxycycline which is what I did place her on last week. Overall the odor that she was noticing has also cleared which is great news. With regard to the x-ray there does not appear to be any signs of osteomyelitis or bone erosion at this point which is also great news in general and very pleased with where things stand. 12-27-2021 upon evaluation today patient appears to be doing well with regard to her wound this is actually measuring significantly better which is great news. Overall I am extremely pleased with where we stand today. No fevers, chills, nausea, vomiting, or diarrhea. 01-03-2022 upon evaluation today patient's wound is actually showing signs of significant improvement which is also news. I am very pleased with what we are seeing there is a little bit of callus buildup still but we are going to work on that today otherwise she seems to be making excellent progress which is great news. 01-10-2022 upon evaluation today patient appears to be doing well currently in regard to her toe ulcer. She has been tolerating the dressing changes without complication. Fortunately there does not appear to be any evidence of active infection locally or systemically which is great news. No fevers, chills, nausea, vomiting, or diarrhea. 01-17-2022 upon evaluation today patient's toe was actually doing about the same this week there is not a tremendous improvement in size but overall the appearance of the wound is better it is much flatter I do think the [facility] done well in that regard. We will  still wait to hear  back on the skin substitute. With that being said I do believe that the patient could benefit from again the skin substitute to try to help get this to heal much more effectively Donna [name], Donna [name] ([id number]) [id number] Page 7 of 10 and quickly in the meantime I am trying to help flatten this out which I think is doing a pretty good job.. 6/7; wound on the plantar right great toe over the distal phalanx. Wound is slightly smaller but looks healthier there is rims of epithelialization she still has some callus on the toe indicative of inadequate pressure/friction relief. I talked to her about making sure in the forefoot offloading boot that the weight stays on her heel in stance phase 01-31-2022 upon evaluation patient's wound is still continuing to show signs of really minimal progress unfortunately. I actually believe that she may need to go into the total contact cast in order to get this to heal and I discussed that with her today as well. Obviously this has been a little bit of a barrier for her not because she does not want to do it and does not want to get healed but rather because it is going to prevent her from being able to drive which is obviously going to be a significant quality-of-life issue with what she does in the position she is in at work. 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 I do believe she could work with the cast on but the issue simply is that she cannot drive to get to work. Otherwise should be able to walk and do what she needs to do. 02-07-2022 upon evaluation today patient appears to be doing well with regard to her wounds. She has been tolerating the dressing changes without complication. Fortunately I do not see any signs of infection although the wound itself is not significantly smaller as of yet. This is something that we are trying to work on I do believe a total contact  cast would be the right way to go. I discussed this with the patient today as well. She is actually in agreement with giving this a trial although she is a little nervous about it being that she is claustrophobic. 6/23; patient presents for obligatory cast exchange. She reports that her heel feels sore from the total contact cast. On that she had no issues. 02-14-2022 upon evaluation today patient's wound is actually showing signs of excellent improvement. There does not appear to be any signs of active infection at this time and overall the toe looks amazing in just 1 week's time. We have come down almost half compared to where we were before size wise and this is also him as well. Overall I think that the casting has done extremely good for her and the toe in general looks significantly improved. I think this week since we want to have to mess with the wound bed itself just the callus around she will likely have even better improvement come next time. 02-21-2022 upon evaluation today patient appears to be doing well with regard to her toe. She is actually showing signs of improvement this is a little bit slower than she would like to see but nonetheless we are seeing significant changes in the right direction in my opinion. Fortunately she is also developing a lot less callus on the end of the toe which is helpful for her as well in  my opinion. 02-28-2022 upon evaluation today patient appears to be doing well currently in regard to her wound. She has been tolerating the dressing changes. Fortunately I do not see any signs of infection I think the cast is doing an awesome job here. No fevers, chills, nausea, vomiting, or diarrhea. 03-07-2022 upon evaluation today patient appears to be doing well with regard to her wound although the collagen is very stuck and dry on the area on the tip of her toe at this point. Nonetheless I discussed with her that I am a little reluctant to put the cast on this week due to  what appeared to been a blister along the left side of her toe laterally. Nonetheless this has been a little worried about the possibility of infection and I do not see anything obvious I think that we may not want to put the cast on this week just to be on the safe side she voiced understanding. With that being said if were not to use the cast she is going to need to make sure to have still some pressure relief I think making gauze bolster with 3 gauze up underneath the toe is going to do a good job here. We will roll and tape this and then subsequently anchored underneath the big toe should be using the front offloading shoe as well. 7/26; patient I know from a previous stay in this clinic. Type II diabetic with a wound on her right plantar great toe. She was in a total contact cast to 2 weeks ago the wound was close to closed last week according to our intake nurse. We put Xeroform and gauze on this that the patient has been changing her self. She has a forefoot offloading boot. 03-21-2022 upon a variable evaluation today patient appears to be doing somewhat poorly in regard to callus buildup at this time. Fortunately I do not see any evidence of overall worsening but this is just still open. No fevers, chills, nausea, vomiting, or diarrhea. I think we may have to go back into the cast next week if this is not significantly improved by that time. 03-28-2022 upon evaluation today patient's wound is actually showing signs of significant improvement which is great news. The cast over the past week he has done a great job. This is so small however but still has a little bit of a deeper area that I am concerned about the possibility of it drying out too much under the cast that is what we saw today and callused over. Subsequently I did remove the callus. 04-11-2022 upon evaluation today patient appears to still have a lot of callus buildup today. She was sick with COVID last weeks has been 2 weeks since  have seen her. This is what accounts for some of the callus buildup in my opinion. With that being said she tells me she was not nearly as active working from home with COVID as she can go in. Nonetheless I do believe still that based on what we are seeing this is still too much friction is likely occurring around the toe area. 04-18-2022 upon evaluation today patient appears to be doing excellent in regard to her wound in fact this appears to be completely healed which is great news. Fortunately I do not see any evidence of active infection locally or systemically at this time which is excellent as well. No fevers, chills, nausea, vomiting, or diarrhea. 06/29/2022 Ms. Marquita Palms is a 56 year old female with a past medical  history of uncontrolled type 2 diabetes that presents to the clinic for a wound to the right foot. On 06/02/2022 she presented to the hospital for cellulitis of the right foot and found to have osteomyelitis of the fifth toe. She had a 5th ray amputation on 10/14. Her culture grew Staph aureus. She was placed on 10 days of linezolid by infectious disease. She has been doing a dry dressing for the past 3-4 weeks. She currently denies systemic signs of infection. 11/17; patient presents for follow-up. She has been using Santyl and silver alginate to the wound bed daily. She reports using Vashe wound cleanser between dressing changes. She is taking doxycycline currently. She has no issues or complaints today. 11/28; patient presents for follow-up. She has been using Santyl and silver alginate to the wound bed without issues. She completed her course of doxycycline. She denies signs of infection. Patient History Information obtained from Patient. Family History Cancer - Father, Diabetes - Mother, Heart Disease - Father,Mother. Social History Never smoker, Marital Status - Single, Alcohol Use - Never, Drug Use - No History, Caffeine Use - Daily. Medical  History Cardiovascular Patient has history of Hypertension Endocrine Patient has history of Type II Diabetes Neurologic Patient has history of Neuropathy Hospitalization/Surgery History - right exicision haglund's deformity wih achilles tendon repair 2020. 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 Vitals Time Taken: 8:15 AM, Temperature: 98.0 F, Pulse: 79 bpm, Respiratory Rate: 20 breaths/min, Blood Pressure: 127/80 mmHg. Cardiovascular 2+ dorsalis pedis/posterior tibialis pulses. Psychiatric pleasant and cooperative. General Notes: Right foot: T the lateral aspect she has a surgical wound status post fifth ray amputation. The wound bed has nonviable tissue and scant o granulation tissue. No erythema to the periwound Integumentary (Hair, Skin) Wound #3 status is Open. Original cause of wound was Surgical Injury. The date acquired was: 05/21/2022. The wound has been in treatment 2 weeks. The wound is located on the Right Amputation Site - T The wound measures 1.8cm length x 0.5cm width x 0.1cm depth; 0.707cm^2 area and 0.071cm^3 volume. oe. There is Fat Layer (Subcutaneous Tissue) exposed. There is no tunneling or undermining noted. There is a medium amount of serosanguineous drainage noted. The wound margin is distinct with the outline attached to the wound base. There is medium (34-66%) red, pink granulation within the wound bed. There is a medium (34-66%) amount of necrotic tissue within the wound bed including Adherent Slough. The periwound skin appearance exhibited: Callus. The periwound skin appearance did not exhibit: Crepitus, Excoriation, Induration, Rash, Scarring, Dry/Scaly, Maceration, Atrophie Blanche, Cyanosis,  Ecchymosis, Hemosiderin Staining, Mottled, Pallor, Rubor, Erythema. Periwound temperature was noted as No Abnormality. Assessment Active Problems ICD-10 Unspecified open wound, right foot, initial encounter Type 2 diabetes mellitus with foot ulcer Gangrene, not elsewhere classified Osteomyelitis, unspecified Patient's wound has shown improvement in size and appearance since last clinic visit. I debrided nonviable tissue. I recommended continuing the course with silver alginate and Santyl. She just started back at work. 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 I recommended still trying to aggressively offload this area and use her surgical shoe. No signs of infection on exam to suggest continuing antibiotics. Follow-up in 1 week. Procedures Wound #3 Pre-procedure diagnosis of Wound #3 is a Diabetic Wound/Ulcer of the Lower Extremity located on the Right Amputation Site - T .Severity  of Tissue Pre oe Debridement is: Fat layer exposed. There was a Excisional Skin/Subcutaneous Tissue Debridement with a total area of 0.9 sq cm performed by Geralyn Corwin, DO. With the following instrument(s): Curette to remove Viable and Non-Viable tissue/material. Material removed includes Subcutaneous Tissue, Slough, Skin: Dermis, and Skin: Epidermis after achieving pain control using Lidocaine 4% Topical Solution. A time out was conducted at 08:45, prior to the start of the procedure. A Minimum amount of bleeding was controlled with Pressure. The procedure was tolerated well with a pain level of 0 throughout and a pain level of 0 following the procedure. Post Debridement Measurements: 1.8cm length x 0.5cm width x 0.1cm depth; 0.071cm^3 volume. Character of Wound/Ulcer Post Debridement is improved. Severity of Tissue Post Debridement is: Fat layer exposed. Post procedure Diagnosis Wound #3: Same as Pre-Procedure Plan Follow-up Appointments: Return Appointment in 1 week. - Dr. Mikey Bussing Return Appointment in 2 weeks. -  Dr. Mikey Bussing Anesthetic: Donna Bollman ([id number]) [id number] Page 9 of 10 (In clinic) Topical Lidocaine 4% applied to wound bed Bathing/ Shower/ Hygiene: May shower with protection but do not get wound dressing(s) wet. Edema Control - Lymphedema / SCD / Other: Elevate legs to the level of the heart or above for 30 minutes daily and/or when sitting, a frequency of: - throughout the day. Avoid standing for long periods of time. Off-Loading: Open toe surgical shoe to: - office to provide a new surgical shoe. ensure no pressure to right lateral foot. WOUND #3: - Amputation Site - T oe Wound Laterality: Right Cleanser: Vashe 1 x Per Day/30 Days Discharge Instructions: use to cleanse wound daily. Prim Dressing: KerraCel Ag Gelling Fiber Dressing, 4x5 in (silver alginate) (Generic) 1 x Per Day/30 Days ary Discharge Instructions: Apply silver alginate over the santyl. Prim Dressing: Santyl Ointment 1 x Per Day/30 Days ary Discharge Instructions: Apply in office today and use once it arrives at home instead of medihoney. Secondary Dressing: Woven Gauze Sponge, Non-Sterile 4x4 in (Generic) 1 x Per Day/30 Days Discharge Instructions: Apply over primary dressing as directed. Secured With: Insurance underwriter, Sterile 2x75 (in/in) (Generic) 1 x Per Day/30 Days Discharge Instructions: Secure with stretch gauze as directed. Secured With: 31M Medipore H Soft Cloth Surgical T ape, 4 x 10 (in/yd) (Generic) 1 x Per Day/30 Days Discharge Instructions: Secure with tape as directed. 1. In office sharp debridement 2. Santyl and silver alginate 3. Aggressive offloadingoosurgical shoe Electronic Signature(s) Signed: 07/17/2022 11:39:14 AM By: Geralyn Corwin DO Entered By: Geralyn Corwin on 07/17/2022 09:19:35 -------------------------------------------------------------------------------- HxROS Details Patient Name: Date of Service: Donna Bollman.  07/17/2022 8:00 A M Medical Record Number: [id number] Patient Account Number: [id number] Date of Birth/Sex: Treating RN: [birth year] (56 y.o. F) Primary Care Provider: Hillard Danker Other Clinician: Referring Provider: Treating Provider/Extender: Leandrew Koyanagi in Treatment: 2 Information Obtained From Patient Cardiovascular Medical History: Positive for: Hypertension Past Medical History Notes: Hyperlipidemia Endocrine Medical History: Positive for: Type II Diabetes Treated with: Insulin, Oral agents Blood sugar tested every day: No Neurologic Medical History: Positive for: Neuropathy Immunizations Pneumococcal Vaccine: Received Pneumococcal Vaccination: No Implantable Devices None Donna [name], Donna [name] ([id number]) [phone].pdf Page 10 of 10 Hospitalization / Surgery History Type of Hospitalization/Surgery right exicision haglund's deformity wih achilles tendon repair 2020 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 Family and Social History Cancer: Yes - Father; Diabetes: Yes - Mother; Heart Disease: Yes - Father,Mother; Never smoker; Marital Status - Single; Alcohol Use: Never; Drug Use: No History; Caffeine  Use: Daily; Financial Concerns: No; Food, Clothing or Shelter Needs: No; Support System Lacking: No; Transportation Concerns: No Electronic Signature(s) Signed: 07/17/2022 11:39:14 AM By: Geralyn Corwin DO Entered By: Geralyn Corwin on 07/17/2022 09:16:13 -------------------------------------------------------------------------------- SuperBill Details Patient Name: Date of Service: Donna Bollman 07/17/2022 Medical Record Number: [id number] Patient Account Number: [id number] Date of Birth/Sex: Treating RN: 03/05/1966 (56 y.o. Donna [name], Donna [name] Primary Care Provider: Hillard Danker Other Clinician: Referring Provider: Treating Provider/Extender: Leandrew Koyanagi in Treatment: 2 Diagnosis Coding ICD-10 Codes Code Description S91.301A Unspecified open wound, right foot, initial encounter E11.621 Type 2 diabetes mellitus with foot ulcer I96 Gangrene, not elsewhere classified M86.9 Osteomyelitis, unspecified Facility Procedures : CPT4 Code: [id number] Description: 11042 - DEB SUBQ TISSUE 20 SQ CM/< ICD-10 Diagnosis Description S91.301A Unspecified open wound, right foot, initial encounter E11.621 Type 2 diabetes mellitus with foot ulcer Modifier: Quantity: 1 Physician Procedures : CPT4 Code Description Modifier [id number] 11042 - WC PHYS SUBQ TISS 20 SQ CM ICD-10 Diagnosis Description S91.301A Unspecified open wound, right foot, initial encounter E11.621 Type 2 diabetes mellitus with foot ulcer Quantity: 1 Electronic Signature(s) Signed: 07/17/2022 11:39:14 AM By: Geralyn Corwin DO Entered By: Geralyn Corwin on 07/17/2022 09:19:52

## 2022-07-24 ENCOUNTER — Encounter (HOSPITAL_BASED_OUTPATIENT_CLINIC_OR_DEPARTMENT_OTHER): Payer: 59 | Attending: Internal Medicine | Admitting: Internal Medicine

## 2022-07-24 DIAGNOSIS — Z833 Family history of diabetes mellitus: Secondary | ICD-10-CM | POA: Insufficient documentation

## 2022-07-24 DIAGNOSIS — L03115 Cellulitis of right lower limb: Secondary | ICD-10-CM | POA: Insufficient documentation

## 2022-07-24 DIAGNOSIS — E1152 Type 2 diabetes mellitus with diabetic peripheral angiopathy with gangrene: Secondary | ICD-10-CM | POA: Diagnosis not present

## 2022-07-24 DIAGNOSIS — I10 Essential (primary) hypertension: Secondary | ICD-10-CM | POA: Insufficient documentation

## 2022-07-24 DIAGNOSIS — S91301A Unspecified open wound, right foot, initial encounter: Secondary | ICD-10-CM

## 2022-07-24 DIAGNOSIS — E11621 Type 2 diabetes mellitus with foot ulcer: Secondary | ICD-10-CM | POA: Insufficient documentation

## 2022-07-24 DIAGNOSIS — E1140 Type 2 diabetes mellitus with diabetic neuropathy, unspecified: Secondary | ICD-10-CM | POA: Insufficient documentation

## 2022-07-24 NOTE — Progress Notes (Signed)
EYVA, CALIFANO ([id number]) [id number] Page 1 of 8 Visit Report for 07/24/2022 Arrival Information Details Patient Name: Date of Service: Donna [name], Donna [name] 07/24/2022 8:00 A M Medical Record Number: [id number] Patient Account Number: [id number] Date of Birth/Sex: Treating RN: 1965/11/27 (56 y.o. Donna [name] Primary Care Kimia Finan: Hillard Danker Other Clinician: Referring Clarkson Rosselli: Treating Ambrose Wile/Extender: Leandrew Koyanagi in Treatment: 3 Visit Information History Since Last Visit Added or deleted any medications: No Patient Arrived: Ambulatory Any new allergies or adverse reactions: No Arrival Time: 08:15 Had a fall or experienced change in No Accompanied By: self activities of daily living that may affect Transfer Assistance: None risk of falls: Patient Identification Verified: Yes Signs or symptoms of abuse/neglect since last visito No Secondary Verification Process Completed: Yes Hospitalized since last visit: No Patient Requires Transmission-Based Precautions: No Implantable device outside of the clinic excluding No Patient Has Alerts: Yes cellular tissue based products placed in the center Patient Alerts: ABI's: [name]:1.16 11/2021 since last visit: Has Dressing in Place as Prescribed: Yes Pain Present Now: Yes Electronic Signature(s) Signed: 07/24/2022 3:48:14 PM By: Redmond Pulling RN, BSN Entered By: Redmond Pulling on 07/24/2022 08:16:10 -------------------------------------------------------------------------------- Encounter Discharge Information Details Patient Name: Date of Service: Donna [name]. 07/24/2022 8:00 A M Medical Record Number: [id number] Patient Account Number: [id number] Date of Birth/Sex: Treating RN: 10-26-1965 (56 y.o. Donna [name], Donna [name] Primary Care Brucha Ahlquist: Hillard Danker Other Clinician: Referring Jackquline Branca: Treating Kaileah Shevchenko/Extender: Leandrew Koyanagi in Treatment: 3 Encounter Discharge Information Items Post Procedure Vitals Discharge Condition: Stable Temperature (F): 98.2 Ambulatory Status: Ambulatory Pulse (bpm): 74 Discharge Destination: Home Respiratory Rate (breaths/min): 18 Transportation: Private Auto Blood Pressure (mmHg): 129/78 Accompanied By: self Schedule Follow-up Appointment: Yes Clinical Summary of Care: Electronic Signature(s) Signed: 07/24/2022 3:03:04 PM By: Shawn Stall RN, BSN Entered By: Shawn Stall on 07/24/2022 08:43:43 Donna [name] ([id number]) [id number] Page 2 of 8 -------------------------------------------------------------------------------- Lower Extremity Assessment Details Patient Name: Date of Service: Donna [name] 07/24/2022 8:00 A M Medical Record Number: [id number] Patient Account Number: [id number] Date of Birth/Sex: Treating RN: 1965-11-01 (56 y.o. Donna [name] Primary Care Jyden Kromer: Hillard Danker Other Clinician: Referring Ajane Novella: Treating Anny Sayler/Extender: Leandrew Koyanagi in Treatment: 3 Edema Assessment Assessed: [Left: No] [Right: No] Edema: [Left: N] [Right: o] Calf Left: Right: Point of Measurement: 31 cm From Medial Instep 36.7 cm Ankle Left: Right: Point of Measurement: 11 cm From Medial Instep 22 cm Vascular Assessment Pulses: Dorsalis Pedis Palpable: [Right:Yes] Electronic Signature(s) Signed: 07/24/2022 3:48:14 PM By: Redmond Pulling RN, BSN Entered By: Redmond Pulling on 07/24/2022 08:20:47 -------------------------------------------------------------------------------- Multi Wound Chart Details Patient Name: Date of Service: Donna [name]. 07/24/2022 8:00 A M Medical Record Number: [id number] Patient Account Number: [id number] Date of Birth/Sex: Treating RN: [birth year] (56 y.o. F) Primary Care Taleshia Luff: Hillard Danker Other Clinician: Referring Karah Caruthers: Treating  Sunni Richardson/Extender: Leandrew Koyanagi in Treatment: 3 Vital Signs Height(in): Pulse(bpm): 74 Weight(lbs): Blood Pressure(mmHg): 129/78 Body Mass Index(BMI): Temperature(F): 98.2 Respiratory Rate(breaths/min): 18 [3:Photos:] [N/A:N/A] Right Amputation Site - T oe N/A N/A Wound Location: Surgical Injury N/A N/A Wounding Event: Diabetic Wound/Ulcer of the Lower N/A N/A Primary Etiology: Extremity Open Surgical Wound N/A N/A Secondary Etiology: Hypertension, Type II Diabetes, N/A N/A Comorbid History: Neuropathy 05/21/2022 N/A N/A Date Acquired: 3 N/A N/A Weeks of Treatment: Open N/A N/A Wound Status: No N/A N/A Wound Recurrence: 1.5x0.6x0.1 N/A N/A Measurements L x W x D (cm) 0.707 N/A N/A A (cm) : rea 0.071 N/A  N/A Volume (cm) : 85.60% N/A N/A % Reduction in A rea: 98.40% N/A N/A % Reduction in Volume: Grade 4 N/A N/A Classification: Medium N/A N/A Exudate A mount: Serosanguineous N/A N/A Exudate Type: red, brown N/A N/A Exudate Color: Distinct, outline attached N/A N/A Wound Margin: Medium (34-66%) N/A N/A Granulation A mount: Pink, Pale N/A N/A Granulation Quality: Medium (34-66%) N/A N/A Necrotic A mount: Fat Layer (Subcutaneous Tissue): Yes N/A N/A Exposed Structures: Fascia: No Tendon: No Muscle: No Joint: No Bone: No None N/A N/A Epithelialization: Debridement - Excisional N/A N/A Debridement: Pre-procedure Verification/Time Out 08:35 N/A N/A Taken: Lidocaine 4% Topical Solution N/A N/A Pain Control: Callus, Subcutaneous, Slough N/A N/A Tissue Debrided: Skin/Subcutaneous Tissue N/A N/A Level: 0.9 N/A N/A Debridement A (sq cm): rea Curette N/A N/A Instrument: Minimum N/A N/A Bleeding: Pressure N/A N/A Hemostasis A chieved: 0 N/A N/A Procedural Pain: 0 N/A N/A Post Procedural Pain: Procedure was tolerated well N/A N/A Debridement Treatment Response: 1.5x0.6x0.1 N/A N/A Post Debridement  Measurements L x W x D (cm) 0.071 N/A N/A Post Debridement Volume: (cm) Callus: Yes N/A N/A Periwound Skin Texture: Excoriation: No Induration: No Crepitus: No Rash: No Scarring: No Maceration: No N/A N/A Periwound Skin Moisture: Dry/Scaly: No Atrophie Blanche: No N/A N/A Periwound Skin Color: Cyanosis: No Ecchymosis: No Erythema: No Hemosiderin Staining: No Mottled: No Pallor: No Rubor: No No Abnormality N/A N/A Temperature: Debridement N/A N/A Procedures Performed: Treatment Notes Wound #3 (Amputation Site - Toe) Wound Laterality: Right Cleanser Vashe Discharge Instruction: use to cleanse wound daily. Peri-Wound Care Topical Primary Dressing Hydrofera Blue Ready Transfer Foam, 2.5x2.5 (in/in) Discharge Instruction: Apply directly to wound bed as directed Santyl Ointment Discharge Instruction: Apply in office today and use once it arrives at home instead of medihoney. Donna [name], Donna [name] ([id number]) [id number] Page 4 of 8 Secondary Dressing Woven Gauze Sponge, Non-Sterile 4x4 in Discharge Instruction: Apply over primary dressing as directed. Secured With Conforming Stretch Gauze Bandage, Sterile 2x75 (in/in) Discharge Instruction: Secure with stretch gauze as directed. 67M Medipore H Soft Cloth Surgical T ape, 4 x 10 (in/yd) Discharge Instruction: Secure with tape as directed. Compression Wrap Compression Stockings Add-Ons Electronic Signature(s) Signed: 07/24/2022 11:32:14 AM By: Geralyn CorwinHoffman, Jessica DO Entered By: Geralyn CorwinHoffman, Jessica on 07/24/2022 08:47:06 -------------------------------------------------------------------------------- Multi-Disciplinary Care Plan Details Patient Name: Date of Service: Donna [name], Donna [name]. 07/24/2022 8:00 A M Medical Record Number: [id number] Patient Account Number: [id number] Date of Birth/Sex: Treating RN: 10/03/1965 (56 y.o. Donna PickettF) Deaton, Donna KendallBobbi Primary Care Joao Mccurdy: Hillard Dankerrawford, Elizabeth Other  Clinician: Referring Jayceion Lisenby: Treating Novis League/Extender: Leandrew KoyanagiHoffman, Jessica Crawford, Elizabeth Weeks in Treatment: 3 Active Inactive Necrotic Tissue Nursing Diagnoses: Impaired tissue integrity related to necrotic/devitalized tissue Goals: Necrotic/devitalized tissue will be minimized in the wound bed Date Initiated: 06/29/2022 Target Resolution Date: 08/17/2022 Goal Status: Active Patient/caregiver will verbalize understanding of reason and process for debridement of necrotic tissue Date Initiated: 06/29/2022 Target Resolution Date: 08/17/2022 Goal Status: Active Interventions: Assess patient pain level pre-, during and post procedure and prior to discharge Provide education on necrotic tissue and debridement process Treatment Activities: Apply topical anesthetic as ordered : 06/29/2022 Notes: Pain, Acute or Chronic Nursing Diagnoses: Potential alteration in comfort, pain Goals: Patient will verbalize adequate pain control and receive pain control interventions during procedures as needed Date Initiated: 06/29/2022 Target Resolution Date: 08/17/2022 Goal Status: Active Interventions: Complete pain assessment as per visit requirements Donna [name], Donna [name] ([id number]) [id number] Page 5 of 8 Encourage patient to take pain medications as prescribed Provide education on pain management Treatment Activities:  Administer pain control measures as ordered : 06/29/2022 Notes: Wound/Skin Impairment Nursing Diagnoses: Knowledge deficit related to ulceration/compromised skin integrity Goals: Patient/caregiver will verbalize understanding of skin care regimen Date Initiated: 06/29/2022 Target Resolution Date: 08/17/2022 Goal Status: Active Interventions: Assess patient/caregiver ability to perform ulcer/skin care regimen upon admission and as needed Assess ulceration(s) every visit Provide education on ulcer and skin care Treatment Activities: Skin care  regimen initiated : 06/29/2022 Topical wound management initiated : 06/29/2022 Notes: Electronic Signature(s) Signed: 07/24/2022 3:03:04 PM By: Shawn Stall RN, BSN Entered By: Shawn Stall on 07/24/2022 08:27:11 -------------------------------------------------------------------------------- Pain Assessment Details Patient Name: Date of Service: Donna [name] 07/24/2022 8:00 A M Medical Record Number: [id number] Patient Account Number: [id number] Date of Birth/Sex: Treating RN: [birth year] (56 y.o. Donna [name] Primary Care Jeray Shugart: Hillard Danker Other Clinician: Referring Jude Linck: Treating Kurtis Anastasia/Extender: Leandrew Koyanagi in Treatment: 3 Active Problems Location of Pain Severity and Description of Pain Patient Has Paino No Site Locations Pain Management and Medication Current Pain ManagementGWYNDOLYN, Donna [name] ([id number]) [phone].pdf Page 6 of 8 Electronic Signature(s) Signed: 07/24/2022 3:48:14 PM By: Redmond Pulling RN, BSN Entered By: Redmond Pulling on 07/24/2022 08:20:13 -------------------------------------------------------------------------------- Patient/Caregiver Education Details Patient Name: Date of Service: Donna [name] 12/5/2023andnbsp8:00 A M Medical Record Number: [id number] Patient Account Number: [id number] Date of Birth/Gender: Treating RN: 03/14/1966 (56 y.o. [name] Primary Care Physician: Hillard Danker Other Clinician: Referring Physician: Treating Physician/Extender: Leandrew Koyanagi in Treatment: 3 Education Assessment Education Provided To: Patient Education Topics Provided Wound/Skin Impairment: Handouts: Skin Care Do's and Dont's Methods: Explain/Verbal Responses: Reinforcements needed Electronic Signature(s) Signed: 07/24/2022 3:03:04 PM By: Shawn Stall RN, BSN Entered By: Shawn Stall on 07/24/2022  08:27:27 -------------------------------------------------------------------------------- Wound Assessment Details Patient Name: Date of Service: Donna [name] 07/24/2022 8:00 A M Medical Record Number: [id number] Patient Account Number: [id number] Date of Birth/Sex: Treating RN: Dec 04, 1965 (56 y.o. Donna [name] Primary Care Liseth Wann: Hillard Danker Other Clinician: Referring Donna [name]: Treating Jessicamarie Amiri/Extender: Leandrew Koyanagi in Treatment: 3 Wound Status Wound Number: 3 Primary Etiology: Diabetic Wound/Ulcer of the Lower Extremity Wound Location: Right Amputation Site - Toe Secondary Etiology: Open Surgical Wound Wounding Event: Surgical Injury Wound Status: Open Date Acquired: 05/21/2022 Comorbid History: Hypertension, Type II Diabetes, Neuropathy Weeks Of Treatment: 3 Clustered Wound: No Photos Donna [name], Donna [name] ([id number]) [id number] Page 7 of 8 Wound Measurements Length: (cm) 1.5 Width: (cm) 0.6 Depth: (cm) 0.1 Area: (cm) 0.707 Volume: (cm) 0.071 % Reduction in Area: 85.6% % Reduction in Volume: 98.4% Epithelialization: None Tunneling: No Undermining: No Wound Description Classification: Grade 4 Wound Margin: Distinct, outline attached Exudate Amount: Medium Exudate Type: Serosanguineous Exudate Color: red, brown Foul Odor After Cleansing: No Slough/Fibrino Yes Wound Bed Granulation Amount: Medium (34-66%) Exposed Structure Granulation Quality: Pink, Pale Fascia Exposed: No Necrotic Amount: Medium (34-66%) Fat Layer (Subcutaneous Tissue) Exposed: Yes Necrotic Quality: Adherent Slough Tendon Exposed: No Muscle Exposed: No Joint Exposed: No Bone Exposed: No Periwound Skin Texture Texture Color No Abnormalities Noted: No No Abnormalities Noted: No Callus: Yes Atrophie Blanche: No Crepitus: No Cyanosis: No Excoriation: No Ecchymosis: No Induration: No Erythema: No Rash:  No Hemosiderin Staining: No Scarring: No Mottled: No Pallor: No Moisture Rubor: No No Abnormalities Noted: No Dry / Scaly: No Temperature / Pain Maceration: No Temperature: No Abnormality Treatment Notes Wound #3 (Amputation Site - Toe) Wound Laterality: Right Cleanser Vashe Discharge Instruction: use to cleanse wound daily. Peri-Wound Care Topical Primary Dressing Hydrofera Blue Ready Transfer  Foam, 2.5x2.5 (in/in) Discharge Instruction: Apply directly to wound bed as directed Santyl Ointment Discharge Instruction: Apply in office today and use once it arrives at home instead of medihoney. Secondary Dressing Woven Gauze Sponge, Non-Sterile 4x4 in Discharge Instruction: Apply over primary dressing as directed. Secured With [employer], Sterile 2x75 (in/in) Barre, Sheylin [name] ([id number]) [id number] Page 8 of 8 Discharge Instruction: Secure with stretch gauze as directed. 35M Medipore H Soft Cloth Surgical T ape, 4 x 10 (in/yd) Discharge Instruction: Secure with tape as directed. Compression Wrap Compression Stockings Add-Ons Electronic Signature(s) Signed: 07/24/2022 3:48:14 PM By: Redmond Pulling RN, BSN Entered By: Redmond Pulling on 07/24/2022 08:23:58 -------------------------------------------------------------------------------- Vitals Details Patient Name: Date of Service: Donna [name]. 07/24/2022 8:00 A M Medical Record Number: [id number] Patient Account Number: [id number] Date of Birth/Sex: Treating RN: 1965-09-22 (56 y.o. Donna [name] Primary Care Kolby Myung: Hillard Danker Other Clinician: Referring Lanson Randle: Treating Bani Gianfrancesco/Extender: Leandrew Koyanagi in Treatment: 3 Vital Signs Time Taken: 08:19 Temperature (F): 98.2 Pulse (bpm): 74 Respiratory Rate (breaths/min): 18 Blood Pressure (mmHg): 129/78 Reference Range: 80 - 120 mg / dl Electronic Signature(s) Signed:  07/24/2022 3:48:14 PM By: Redmond Pulling RN, BSN Entered By: Redmond Pulling on 07/24/2022 08:20:05

## 2022-07-24 NOTE — Progress Notes (Signed)
ARALYN, NOWAK ([id number]) [id number] Page 1 of 10 Visit Report for 07/24/2022 Chief Complaint Document Details Patient Name: Date of Service: Donna [name], Donna [name] 07/24/2022 8:00 A M Medical Record Number: [id number] Patient Account Number: [id number] Date of Birth/Sex: Treating RN: Apr 09, 1966 (56 y.o. F) Primary Care Provider: Hillard Danker Other Clinician: Referring Provider: Treating Provider/Extender: Leandrew Koyanagi in Treatment: 3 Information Obtained from: Patient Chief Complaint 06/29/2022; surgical wound to the right foot status post right fifth ray amputation Electronic Signature(s) Signed: 07/24/2022 11:32:14 AM By: Geralyn Corwin DO Entered By: Geralyn Corwin on 07/24/2022 08:47:28 -------------------------------------------------------------------------------- Debridement Details Patient Name: Date of Service: [name]. 07/24/2022 8:00 A M Medical Record Number: [id number] Patient Account Number: [id number] Date of Birth/Sex: Treating RN: 07-07-1966 (56 y.o. [name], Millard.Loa Primary Care Provider: Hillard Danker Other Clinician: Referring Provider: Treating Provider/Extender: Leandrew Koyanagi in Treatment: 3 Debridement Performed for Assessment: Wound #3 Right Amputation Site - Toe Performed By: Physician Geralyn Corwin, DO Debridement Type: Debridement Severity of Tissue Pre Debridement: Fat layer exposed Level of Consciousness (Pre-procedure): Awake and Alert Pre-procedure Verification/Time Out Yes - 08:35 Taken: Start Time: 08:36 Pain Control: Lidocaine 4% T opical Solution T Area Debrided (L x W): otal 1.5 (cm) x 0.6 (cm) = 0.9 (cm) Tissue and other material debrided: Viable, Non-Viable, Callus, Slough, Subcutaneous, Skin: Dermis , Skin: Epidermis, Slough Level: Skin/Subcutaneous Tissue Debridement Description: Excisional Instrument: Curette Bleeding:  Minimum Hemostasis Achieved: Pressure End Time: 08:41 Procedural Pain: 0 Post Procedural Pain: 0 Response to Treatment: Procedure was tolerated well Level of Consciousness (Post- Awake and Alert procedure): Post Debridement Measurements of Total Wound Length: (cm) 1.5 Width: (cm) 0.6 Depth: (cm) 0.1 Volume: (cm) 0.071 Character of Wound/Ulcer Post Debridement: Improved Severity of Tissue Post Debridement: Fat layer exposed FRANSHESCA, CHIPMAN R ([id number]) [id number] Page 2 of 10 Post Procedure Diagnosis Same as Pre-procedure Electronic Signature(s) Signed: 07/24/2022 11:32:14 AM By: Geralyn Corwin DO Signed: 07/24/2022 3:03:04 PM By: Shawn Stall RN, BSN Entered By: Shawn Stall on 07/24/2022 08:42:10 -------------------------------------------------------------------------------- HPI Details Patient Name: Date of Service: [name]. 07/24/2022 8:00 A M Medical Record Number: [id number] Patient Account Number: [id number] Date of Birth/Sex: Treating RN: [birth year] (56 y.o. 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 She tells me that since at least last summer she has had a progressive callused area that has opened closed but is never really healed. She has a history of calluses on her feet. She is a diabetic with neuropathy. She was referred to Dr. [facility] Katz at [facility] who felt that this might be a large plantar wart she was treated with laser for 2 treatments and then subsequently with ammonium lactate none of this is apparently made any difference according to the patient. She has a very odd looking area with thick skin and callus and an almost crossed like  area of open tissue. She also has erythema above this which she says is chronic. The other interesting part of her recent history is recurrent cellulitis on the right lower leg although this is separated from the area we are looking at. She has had trips to see infectious disease has been on Keflex for as long as 20 days. This is presumed to be strep. She saw Dr. Renold Don Past medical  history includes type 2 diabetes, recurrent cellulitis of the right leg, right Achilles tendon repair a year ago for spontaneous rupture ABI in our clinic was 1.09 on the right 2/17; patient admitted the clinic last week had a thick callused area on the right lateral heel she is a diabetic with neuropathy. She had thick callus and across like area of open wound. I aggressively debrided this area to remove most of the callus and gave her Hydrofera Blue and heel off loader, heel cup and she has been wearing this religiously trying to keep the pressure off the area. She comes in today with a cross-like area of injury has healed she has a new open area that I do not remember from last time. Still some dry flaking skin and callus but not nearly as much as last week at which time I had to use a scalpel to remove this. 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 She came in 2 weeks ago with a very thick hyperkeratotic callused area on her right lateral heel. There was a "crossed" shaped area of split tissue with an open wound at the bottom. This had previously been treated as a possible wart without improvement. I removed all of the callus when she first came in here. This is mostly epithelialized over still thick skin but nothing like it was. She has 1 small open area that has not healed she has been more religious about offloading the area 10/27/2020 upon evaluation today patient appears to be doing well with regard to her wound although she does still apparently appear to have something still open in the  base of the wound. Fortunately there is no evidence of infection at this time which is great news and in general I feel like she is doing decently well though she has a lot of callus buildup she is using the offloading shoe for her heel. At home she does not always wear this she tells me. Generally she is using flip-flops there. Of note her primary care provider did give her Keflex yesterday for cellulitis of this leg but has nothing to do with the heel location. 3/17; the patient's area actually looks a lot better. There is no open wound here. Much less callus. She was given a prescription for 40% urea cream I am not sure that really is required this week. She is going to require less friction on this area. I think she is going to need a different type of shoe going forward Readmission: 12-13-2028 upon evaluation today patient presents for reevaluation in the clinic she was last seen by Dr. Leanord Hawking on 11-03-2020 when she was discharged this was due to a crack on her heel. Currently she is actually having an issue with her great toe on the right foot. She tells me that this has been present currently for a couple of weeks since around November 22, 2021. She has had a lot of callus over this area in general but not an open wound until that time. Subsequently this has more recently opened and has Become a greater concern she is also noted some odor which is disconcerting to her. She tells me that her greatest concern is that "she does not lose this toe". Again I completely understand her concern in this regard I definitely think we need to be aggressive in treating this and try to get things better. She is very Adult nurse. Patient does have a history of diabetes mellitus type 2 with diabetic  neuropathy. She also tells me that she does have a history as well of hypertension which is pretty well controlled. She has not yet had an x-ray of the area and has not been on any antibiotics. Both are things I think we  will need to do today. 12-20-2021 upon evaluation today patient appears to be doing well with regard to her toe ulcer all things considered. She has been tolerating the dressing changes without complication. I do have her x-ray as well as her culture for review today the culture did so Staphylococcus aureus no MRSA noted which was great news susceptible to doxycycline which is what I did place her on last week. Overall the odor that she was noticing has also cleared which is great news. With regard to the x-ray there does not appear to be any signs of osteomyelitis or bone erosion at this point which is also great news in general and very pleased with where things stand. 12-27-2021 upon evaluation today patient appears to be doing well with regard to her wound this is actually measuring significantly better which is great news. Overall I am extremely pleased with where we stand today. No fevers, chills, nausea, vomiting, or diarrhea. 01-03-2022 upon evaluation today patient's wound is actually showing signs of significant improvement which is also news. I am very pleased with what we are seeing there is a little bit of callus buildup still but we are going to work on that today otherwise she seems to be making excellent progress which is great news. MARONDA, CAISON ([id number]) [id number] Page 3 of 10 01-10-2022 upon evaluation today patient appears to be doing well currently in regard to her toe ulcer. She has been tolerating the dressing changes without complication. Fortunately there does not appear to be any evidence of active infection locally or systemically which is great news. No fevers, chills, nausea, vomiting, or diarrhea. 01-17-2022 upon evaluation today patient's toe was actually doing about the same this week there is not a tremendous improvement in size but overall the appearance of the wound is better it is much flatter I do think the [facility] has done  well in that regard. We will still wait to hear back on the skin substitute. With that being said I do believe that the patient could benefit from again the skin substitute to try to help get this to heal much more effectively and quickly in the meantime I am trying to help flatten this out which I think is doing a pretty good job.. 6/7; wound on the plantar right great toe over the distal phalanx. Wound is slightly smaller but looks healthier there is rims of epithelialization she still has some callus on the toe indicative of inadequate pressure/friction relief. I talked to her about making sure in the forefoot offloading boot that the weight stays on her heel in stance phase 01-31-2022 upon evaluation patient's wound is still continuing to show signs of really minimal progress unfortunately. I actually believe that she may need to go into the total contact cast in order to get this to heal and I discussed that with her today as well. Obviously this has been a little bit of a barrier for her not because she does not want to do it and does not want to get healed but rather because it is going to prevent her from being able to drive which is obviously going to be a significant quality-of-life issue with what she does in the position she is in  at work. 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 I do believe she could work with the cast on but the issue simply is that she cannot drive to get to work. Otherwise should be able to walk and do what she needs to do. 02-07-2022 upon evaluation today patient appears to be doing well with regard to her wounds. She has been tolerating the dressing changes without complication. Fortunately I do not see any signs of infection although the wound itself is not significantly smaller as of yet. This is something that we are trying to work on I do believe a total contact cast would be the right way to go. I  discussed this with the patient today as well. She is actually in agreement with giving this a trial although she is a little nervous about it being that she is claustrophobic. 6/23; patient presents for obligatory cast exchange. She reports that her heel feels sore from the total contact cast. On that she had no issues. 02-14-2022 upon evaluation today patient's wound is actually showing signs of excellent improvement. There does not appear to be any signs of active infection at this time and overall the toe looks amazing in just 1 week's time. We have come down almost half compared to where we were before size wise and this is also him as well. Overall I think that the casting has done extremely good for her and the toe in general looks significantly improved. I think this week since we want to have to mess with the wound bed itself just the callus around she will likely have even better improvement come next time. 02-21-2022 upon evaluation today patient appears to be doing well with regard to her toe. She is actually showing signs of improvement this is a little bit slower than she would like to see but nonetheless we are seeing significant changes in the right direction in my opinion. Fortunately she is also developing a lot less callus on the end of the toe which is helpful for her as well in my opinion. 02-28-2022 upon evaluation today patient appears to be doing well currently in regard to her wound. She has been tolerating the dressing changes. Fortunately I do not see any signs of infection I think the cast is doing an awesome job here. No fevers, chills, nausea, vomiting, or diarrhea. 03-07-2022 upon evaluation today patient appears to be doing well with regard to her wound although the collagen is very stuck and dry on the area on the tip of her toe at this point. Nonetheless I discussed with her that I am a little reluctant to put the cast on this week due to what appeared to been a blister  along the left side of her toe laterally. Nonetheless this has been a little worried about the possibility of infection and I do not see anything obvious I think that we may not want to put the cast on this week just to be on the safe side she voiced understanding. With that being said if were not to use the cast she is going to need to make sure to have still some pressure relief I think making gauze bolster with 3 gauze up underneath the toe is going to do a good job here. We will roll and tape this and then subsequently anchored underneath the big toe should be using the front offloading shoe as well. 7/26; patient I know  from a previous stay in this clinic. Type II diabetic with a wound on her right plantar great toe. She was in a total contact cast to 2 weeks ago the wound was close to closed last week according to our intake nurse. We put Xeroform and gauze on this that the patient has been changing her self. She has a forefoot offloading boot. 03-21-2022 upon a variable evaluation today patient appears to be doing somewhat poorly in regard to callus buildup at this time. Fortunately I do not see any evidence of overall worsening but this is just still open. No fevers, chills, nausea, vomiting, or diarrhea. I think we may have to go back into the cast next week if this is not significantly improved by that time. 03-28-2022 upon evaluation today patient's wound is actually showing signs of significant improvement which is great news. The cast over the past week he has done a great job. This is so small however but still has a little bit of a deeper area that I am concerned about the possibility of it drying out too much under the cast that is what we saw today and callused over. Subsequently I did remove the callus. 04-11-2022 upon evaluation today patient appears to still have a lot of callus buildup today. She was sick with COVID last weeks has been 2 weeks since have seen her. This is what accounts  for some of the callus buildup in my opinion. With that being said she tells me she was not nearly as active working from home with COVID as she can go in. Nonetheless I do believe still that based on what we are seeing this is still too much friction is likely occurring around the toe area. 04-18-2022 upon evaluation today patient appears to be doing excellent in regard to her wound in fact this appears to be completely healed which is great news. Fortunately I do not see any evidence of active infection locally or systemically at this time which is excellent as well. No fevers, chills, nausea, vomiting, or diarrhea. 06/29/2022 Ms. Marquita Palms is a 56 year old female with a past medical history of uncontrolled type 2 diabetes that presents to the clinic for a wound to the right foot. On 06/02/2022 she presented to the hospital for cellulitis of the right foot and found to have osteomyelitis of the fifth toe. She had a 5th ray amputation on 10/14. Her culture grew Staph aureus. She was placed on 10 days of linezolid by infectious disease. She has been doing a dry dressing for the past 3-4 weeks. She currently denies systemic signs of infection. 11/17; patient presents for follow-up. She has been using Santyl and silver alginate to the wound bed daily. She reports using Vashe wound cleanser between dressing changes. She is taking doxycycline currently. She has no issues or complaints today. 11/28; patient presents for follow-up. She has been using Santyl and silver alginate to the wound bed without issues. She completed her course of doxycycline. She denies signs of infection. 12/5; patient presents for follow-up. She has been using Santyl and silver alginate to the wound bed. She has no issues or complaints today. She denies signs of infection. Electronic Signature(s) Signed: 07/24/2022 11:32:14 AM By: Geralyn Corwin DO Entered By: Geralyn Corwin on 07/24/2022 08:48:00 [name]  ([id number]) [id number] Page 4 of 10 -------------------------------------------------------------------------------- Physical Exam Details Patient Name: Date of Service: HALEIGH, DESMITH 07/24/2022 8:00 A M Medical Record Number: [id number] Patient Account Number: [id number] Date of Birth/Sex: Treating  RN: [birth year] (56 y.o. F) Primary Care Provider: Hillard Danker Other Clinician: Referring Provider: Treating Provider/Extender: Leandrew Koyanagi in Treatment: 3 Constitutional respirations regular, non-labored and within target range for patient.. Cardiovascular 2+ dorsalis pedis/posterior tibialis pulses. Psychiatric pleasant and cooperative. Notes Right foot: T the lateral/dorsal aspect there is an open wound with scant granulation tissue and nonviable tissue. No signs of infection and bleeding increased o warmth, erythema or purulent drainage. Electronic Signature(s) Signed: 07/24/2022 11:32:14 AM By: Geralyn Corwin DO Entered By: Geralyn Corwin on 07/24/2022 08:48:56 -------------------------------------------------------------------------------- Physician Orders Details Patient Name: Date of Service: [name]. 07/24/2022 8:00 A M Medical Record Number: [id number] Patient Account Number: [id number] Date of Birth/Sex: Treating RN: 1965/10/27 (56 y.o. [name], [name] Primary Care Provider: Hillard Danker Other Clinician: Referring Provider: Treating Provider/Extender: Leandrew Koyanagi in Treatment: 3 Verbal / Phone Orders: No Diagnosis Coding ICD-10 Coding Code Description S91.301A Unspecified open wound, right foot, initial encounter E11.621 Type 2 diabetes mellitus with foot ulcer I96 Gangrene, not elsewhere classified M86.9 Osteomyelitis, unspecified Follow-up Appointments ppointment in 1 week. - Dr. Mikey Bussing Return A ppointment in 2 weeks. - Dr. Mikey Bussing Return  A Anesthetic (In clinic) Topical Lidocaine 4% applied to wound bed Bathing/ Shower/ Hygiene May shower with protection but do not get wound dressing(s) wet. Edema Control - Lymphedema / SCD / Other Elevate legs to the level of the heart or above for 30 minutes daily and/or when sitting, a frequency of: - throughout the day. Avoid standing for long periods of time. Off-Loading KRISTE, BROMAN ([id number]) [id number] Page 5 of 10 Open toe surgical shoe to: - office to provide a new surgical shoe. ensure no pressure to right lateral foot. Wound Treatment Wound #3 - Amputation Site - Toe Wound Laterality: Right Cleanser: Vashe 1 x Per Day/30 Days Discharge Instructions: use to cleanse wound daily. Prim Dressing: Hydrofera Blue Ready Transfer Foam, 2.5x2.5 (in/in) 1 x Per Day/30 Days ary Discharge Instructions: Apply directly to wound bed as directed Prim Dressing: Santyl Ointment 1 x Per Day/30 Days ary Discharge Instructions: Apply in office today and use once it arrives at home instead of medihoney. Secondary Dressing: Woven Gauze Sponge, Non-Sterile 4x4 in (Generic) 1 x Per Day/30 Days Discharge Instructions: Apply over primary dressing as directed. Secured With: Insurance underwriter, Sterile 2x75 (in/in) (Generic) 1 x Per Day/30 Days Discharge Instructions: Secure with stretch gauze as directed. Secured With: 56M Medipore H Soft Cloth Surgical T ape, 4 x 10 (in/yd) (Generic) 1 x Per Day/30 Days Discharge Instructions: Secure with tape as directed. Electronic Signature(s) Signed: 07/24/2022 11:32:14 AM By: Geralyn Corwin DO Entered By: Geralyn Corwin on 07/24/2022 08:49:04 -------------------------------------------------------------------------------- Problem List Details Patient Name: Date of Service: [name]. 07/24/2022 8:00 A M Medical Record Number: [id number] Patient Account Number: [id number] Date of Birth/Sex: Treating  RN: June 28, 1966 (56 y.o. [name], [name] Primary Care Provider: Hillard Danker Other Clinician: Referring Provider: Treating Provider/Extender: Leandrew Koyanagi in Treatment: 3 Active Problems ICD-10 Encounter Code Description Active Date MDM Diagnosis S91.301A Unspecified open wound, right foot, initial encounter 06/29/2022 No Yes E11.621 Type 2 diabetes mellitus with foot ulcer 06/29/2022 No Yes I96 Gangrene, not elsewhere classified 06/29/2022 No Yes M86.9 Osteomyelitis, unspecified 06/29/2022 No Yes Inactive Problems Resolved Problems Electronic Signature(s) Signed: 07/24/2022 11:32:14 AM By: Geralyn Corwin DO Entered By: Geralyn Corwin on 07/24/2022 08:46:57 Neeb, Jorge Ny R ([id number]) [id number] Page 6 of 10 -------------------------------------------------------------------------------- Progress Note Details Patient Name: Date of  Service: BREYANNA, VALERA 07/24/2022 8:00 A M Medical Record Number: [id number] Patient Account Number: [id number] Date of Birth/Sex: Treating RN: 1966/03/28 (56 y.o. 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 She tells me that since at least last summer she has had a progressive callused area that has opened closed but is never really healed. She has a history of calluses on her feet. She is a diabetic with neuropathy. She was referred to Dr. Allena Katz at [facility] who felt that this might be a large plantar wart she was treated with laser for 2 treatments and then  subsequently with ammonium lactate none of this is apparently made any difference according to the patient. She has a very odd looking area with thick skin and callus and an almost crossed like area of open tissue. She also has erythema above this which she says is chronic. The other interesting part of her recent history is recurrent cellulitis on the right lower leg although this is separated from the area we are looking at. She has had trips to see infectious disease has been on Keflex for as long as 20 days. This is presumed to be strep. She saw Dr. Renold Don Past medical history includes type 2 diabetes, recurrent cellulitis of the right leg, right Achilles tendon repair a year ago for spontaneous rupture ABI in our clinic was 1.09 on the right 2/17; patient admitted the clinic last week had a thick callused area on the right lateral heel she is a diabetic with neuropathy. She had thick callus and across like area of open wound. I aggressively debrided this area to remove most of the callus and gave her Hydrofera Blue and heel off loader, heel cup and she has been wearing this religiously trying to keep the pressure off the area. She comes in today with a cross-like area of injury has healed she has a new open area that I do not remember from last time. Still some dry flaking skin and callus but not nearly as much as last week at which time I had to use a scalpel to remove this. 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 She came in 2 weeks ago with a very thick hyperkeratotic callused area on her right lateral heel. There was a "crossed" shaped area of split tissue with an open wound at the bottom. This had previously been treated as a possible wart without improvement. I removed all of the callus when she first came in here. This is mostly epithelialized over still thick skin but nothing like it was. She has 1 small open area that has not healed she has been more religious  about offloading the area 10/27/2020 upon evaluation today patient appears to be doing well with regard to her wound although she does still apparently appear to have something still open in the base of the wound. Fortunately there is no evidence of infection at this time which is great news and in general I feel like she is doing decently well though she has a lot of callus buildup she is using the offloading shoe for her heel. At home she does not always wear this  she tells me. Generally she is using flip-flops there. Of note her primary care provider did give her Keflex yesterday for cellulitis of this leg but has nothing to do with the heel location. 3/17; the patient's area actually looks a lot better. There is no open wound here. Much less callus. She was given a prescription for 40% urea cream I am not sure that really is required this week. She is going to require less friction on this area. I think she is going to need a different type of shoe going forward Readmission: 12-13-2028 upon evaluation today patient presents for reevaluation in the clinic she was last seen by Dr. Leanord Hawking on 11-03-2020 when she was discharged this was due to a crack on her heel. Currently she is actually having an issue with her great toe on the right foot. She tells me that this has been present currently for a couple of weeks since around November 22, 2021. She has had a lot of callus over this area in general but not an open wound until that time. Subsequently this has more recently opened and has Become a greater concern she is also noted some odor which is disconcerting to her. She tells me that her greatest concern is that "she does not lose this toe". Again I completely understand her concern in this regard I definitely think we need to be aggressive in treating this and try to get things better. She is very Adult nurse. Patient does have a history of diabetes mellitus type 2 with diabetic neuropathy. She also tells  me that she does have a history as well of hypertension which is pretty well controlled. She has not yet had an x-ray of the area and has not been on any antibiotics. Both are things I think we will need to do today. 12-20-2021 upon evaluation today patient appears to be doing well with regard to her toe ulcer all things considered. She has been tolerating the dressing changes without complication. I do have her x-ray as well as her culture for review today the culture did so Staphylococcus aureus no MRSA noted which was great news susceptible to doxycycline which is what I did place her on last week. Overall the odor that she was noticing has also cleared which is great news. With regard to the x-ray there does not appear to be any signs of osteomyelitis or bone erosion at this point which is also great news in general and very pleased with where things stand. 12-27-2021 upon evaluation today patient appears to be doing well with regard to her wound this is actually measuring significantly better which is great news. Overall I am extremely pleased with where we stand today. No fevers, chills, nausea, vomiting, or diarrhea. 01-03-2022 upon evaluation today patient's wound is actually showing signs of significant improvement which is also news. I am very pleased with what we are seeing there is a little bit of callus buildup still but we are going to work on that today otherwise she seems to be making excellent progress which is great news. 01-10-2022 upon evaluation today patient appears to be doing well currently in regard to her toe ulcer. She has been tolerating the dressing changes without complication. Fortunately there does not appear to be any evidence of active infection locally or systemically which is great news. No fevers, chills, nausea, vomiting, or diarrhea. 01-17-2022 upon evaluation today patient's toe was actually doing about the same this week there is not a tremendous improvement in size  but overall the appearance of the wound is better it is much flatter I do think the [facility] has done well in that regard. We will still wait to hear back on the skin substitute. With that being said I do believe that the patient could benefit from again the skin substitute to try to help get this to heal much more effectively VICKY, SCHLEICH R ([id number]) [id number] Page 7 of 10 and quickly in the meantime I am trying to help flatten this out which I think is doing a pretty good job.. 6/7; wound on the plantar right great toe over the distal phalanx. Wound is slightly smaller but looks healthier there is rims of epithelialization she still has some callus on the toe indicative of inadequate pressure/friction relief. I talked to her about making sure in the forefoot offloading boot that the weight stays on her heel in stance phase 01-31-2022 upon evaluation patient's wound is still continuing to show signs of really minimal progress unfortunately. I actually believe that she may need to go into the total contact cast in order to get this to heal and I discussed that with her today as well. Obviously this has been a little bit of a barrier for her not because she does not want to do it and does not want to get healed but rather because it is going to prevent her from being able to drive which is obviously going to be a significant quality-of-life issue with what she does in the position she is in at work. 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 I do believe she could work with the cast on but the issue simply is that she cannot drive to get to work. Otherwise should be able to walk and do what she needs to do. 02-07-2022 upon evaluation today patient appears to be doing well with regard to her wounds. She has been tolerating the dressing changes without complication. Fortunately I do not see any signs of  infection although the wound itself is not significantly smaller as of yet. This is something that we are trying to work on I do believe a total contact cast would be the right way to go. I discussed this with the patient today as well. She is actually in agreement with giving this a trial although she is a little nervous about it being that she is claustrophobic. 6/23; patient presents for obligatory cast exchange. She reports that her heel feels sore from the total contact cast. On that she had no issues. 02-14-2022 upon evaluation today patient's wound is actually showing signs of excellent improvement. There does not appear to be any signs of active infection at this time and overall the toe looks amazing in just 1 week's time. We have come down almost half compared to where we were before size wise and this is also him as well. Overall I think that the casting has done extremely good for her and the toe in general looks significantly improved. I think this week since we want to have to mess with the wound bed itself just the callus around she will likely have even better improvement come next time. 02-21-2022 upon evaluation today patient appears to be doing well with regard to her toe. She is actually showing signs of improvement this is a little bit slower than she would like to see but nonetheless we are seeing significant changes in the right direction  in my opinion. Fortunately she is also developing a lot less callus on the end of the toe which is helpful for her as well in my opinion. 02-28-2022 upon evaluation today patient appears to be doing well currently in regard to her wound. She has been tolerating the dressing changes. Fortunately I do not see any signs of infection I think the cast is doing an awesome job here. No fevers, chills, nausea, vomiting, or diarrhea. 03-07-2022 upon evaluation today patient appears to be doing well with regard to her wound although the collagen is very stuck  and dry on the area on the tip of her toe at this point. Nonetheless I discussed with her that I am a little reluctant to put the cast on this week due to what appeared to been a blister along the left side of her toe laterally. Nonetheless this has been a little worried about the possibility of infection and I do not see anything obvious I think that we may not want to put the cast on this week just to be on the safe side she voiced understanding. With that being said if were not to use the cast she is going to need to make sure to have still some pressure relief I think making gauze bolster with 3 gauze up underneath the toe is going to do a good job here. We will roll and tape this and then subsequently anchored underneath the big toe should be using the front offloading shoe as well. 7/26; patient I know from a previous stay in this clinic. Type II diabetic with a wound on her right plantar great toe. She was in a total contact cast to 2 weeks ago the wound was close to closed last week according to our intake nurse. We put Xeroform and gauze on this that the patient has been changing her self. She has a forefoot offloading boot. 03-21-2022 upon a variable evaluation today patient appears to be doing somewhat poorly in regard to callus buildup at this time. Fortunately I do not see any evidence of overall worsening but this is just still open. No fevers, chills, nausea, vomiting, or diarrhea. I think we may have to go back into the cast next week if this is not significantly improved by that time. 03-28-2022 upon evaluation today patient's wound is actually showing signs of significant improvement which is great news. The cast over the past week he has done a great job. This is so small however but still has a little bit of a deeper area that I am concerned about the possibility of it drying out too much under the cast that is what we saw today and callused over. Subsequently I did remove the  callus. 04-11-2022 upon evaluation today patient appears to still have a lot of callus buildup today. She was sick with COVID last weeks has been 2 weeks since have seen her. This is what accounts for some of the callus buildup in my opinion. With that being said she tells me she was not nearly as active working from home with COVID as she can go in. Nonetheless I do believe still that based on what we are seeing this is still too much friction is likely occurring around the toe area. 04-18-2022 upon evaluation today patient appears to be doing excellent in regard to her wound in fact this appears to be completely healed which is great news. Fortunately I do not see any evidence of active infection locally or systemically at  this time which is excellent as well. No fevers, chills, nausea, vomiting, or diarrhea. 06/29/2022 Ms. Marquita Palms is a 56 year old female with a past medical history of uncontrolled type 2 diabetes that presents to the clinic for a wound to the right foot. On 06/02/2022 she presented to the hospital for cellulitis of the right foot and found to have osteomyelitis of the fifth toe. She had a 5th ray amputation on 10/14. Her culture grew Staph aureus. She was placed on 10 days of linezolid by infectious disease. She has been doing a dry dressing for the past 3-4 weeks. She currently denies systemic signs of infection. 11/17; patient presents for follow-up. She has been using Santyl and silver alginate to the wound bed daily. She reports using Vashe wound cleanser between dressing changes. She is taking doxycycline currently. She has no issues or complaints today. 11/28; patient presents for follow-up. She has been using Santyl and silver alginate to the wound bed without issues. She completed her course of doxycycline. She denies signs of infection. 12/5; patient presents for follow-up. She has been using Santyl and silver alginate to the wound bed. She has no issues or  complaints today. She denies signs of infection. Patient History Information obtained from Patient. Family History Cancer - Father, Diabetes - Mother, Heart Disease - Father,Mother. Social History Never smoker, Marital Status - Single, Alcohol Use - Never, Drug Use - No History, Caffeine Use - Daily. 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 Medical A Surgical History Notes nd Cardiovascular Hyperlipidemia Objective Constitutional respirations regular, non-labored and within target range for patient.. Vitals Time Taken: 8:19 AM, Temperature: 98.2 F, Pulse: 74 bpm, Respiratory Rate: 18 breaths/min, Blood Pressure: 129/78 mmHg. Cardiovascular 2+ dorsalis pedis/posterior tibialis pulses. Psychiatric pleasant and cooperative. General Notes: Right foot: T the lateral/dorsal aspect there is an open wound with scant granulation tissue and nonviable tissue. No signs of infection and o bleeding increased warmth, erythema or purulent drainage. Integumentary (Hair, Skin) Wound #3 status is Open. Original cause of wound was Surgical Injury. The date acquired was: 05/21/2022. The wound has been in treatment 3 weeks. The wound is located on the Right Amputation Site - T The wound measures 1.5cm length x 0.6cm width x 0.1cm depth; 0.707cm^2 area and 0.071cm^3 volume. oe. There is Fat Layer (Subcutaneous Tissue) exposed. There is no tunneling or undermining noted. There is a medium amount of serosanguineous drainage noted. The wound margin is distinct with the outline attached to the wound base. There is medium (34-66%) pink, pale  granulation within the wound bed. There is a medium (34-66%) amount of necrotic tissue within the wound bed including Adherent Slough. The periwound skin appearance exhibited: Callus. The periwound skin appearance did not exhibit: Crepitus, Excoriation, Induration, Rash, Scarring, Dry/Scaly, Maceration, Atrophie Blanche, Cyanosis, Ecchymosis, Hemosiderin Staining, Mottled, Pallor, Rubor, Erythema. Periwound temperature was noted as No Abnormality. Assessment Active Problems ICD-10 Unspecified open wound, right foot, initial encounter Type 2 diabetes mellitus with foot ulcer Gangrene, not elsewhere classified Osteomyelitis, unspecified Patient's wound has shown improvement in size and appearance since last clinic visit. I debrided nonviable tissue and recommended continuing with Santyl. I will switch the dressing from silver alginate to Hydrofera Blue to help with further debridement. Continue  offloading with surgical shoe. Follow-up in 1 week Procedures Wound #3 Pre-procedure diagnosis of Wound #3 is a Diabetic Wound/Ulcer of the Lower Extremity located on the Right Amputation Site - T .Severity of Tissue Pre oe Debridement is: Fat layer exposed. There was a Excisional Skin/Subcutaneous Tissue Debridement with a total area of 0.9 sq cm performed by Geralyn Corwin, DO. With the following instrument(s): Curette to remove Viable and Non-Viable tissue/material. Material removed includes Callus, Subcutaneous Tissue, Slough, Skin: Dermis, and Skin: Epidermis after achieving pain control using Lidocaine 4% Topical Solution. A time out was conducted at 08:35, prior to the start of the procedure. A Minimum amount of bleeding was controlled with Pressure. The procedure was tolerated well with a pain level of 0 throughout and a pain level of 0 following the procedure. Post Debridement Measurements: 1.5cm length x 0.6cm width x 0.1cm depth; 0.071cm^3 volume. Character of Wound/Ulcer Post Debridement is  improved. Severity of Tissue Post Debridement is: Fat layer exposed. Post procedure Diagnosis Wound #3: Same as Pre-Procedure Plan Follow-up Appointments: Return Appointment in 1 week. - Dr. Dede Query, [facility] ([id number]) [id number] Page 9 of 10 Return Appointment in 2 weeks. - Dr. Mikey Bussing Anesthetic: (In clinic) Topical Lidocaine 4% applied to wound bed Bathing/ Shower/ Hygiene: May shower with protection but do not get wound dressing(s) wet. Edema Control - Lymphedema / SCD / Other: Elevate legs to the level of the heart or above for 30 minutes daily and/or when sitting, a frequency of: - throughout the day. Avoid standing for long periods of time. Off-Loading: Open toe surgical shoe to: - office to provide a new surgical shoe. ensure no pressure to right lateral foot. WOUND #3: - Amputation Site - T oe Wound Laterality: Right Cleanser: Vashe 1 x Per Day/30 Days Discharge Instructions: use to cleanse wound daily. Prim Dressing: Hydrofera Blue Ready Transfer Foam, 2.5x2.5 (in/in) 1 x Per Day/30 Days ary Discharge Instructions: Apply directly to wound bed as directed Prim Dressing: Santyl Ointment 1 x Per Day/30 Days ary Discharge Instructions: Apply in office today and use once it arrives at home instead of medihoney. Secondary Dressing: Woven Gauze Sponge, Non-Sterile 4x4 in (Generic) 1 x Per Day/30 Days Discharge Instructions: Apply over primary dressing as directed. Secured With: Insurance underwriter, Sterile 2x75 (in/in) (Generic) 1 x Per Day/30 Days Discharge Instructions: Secure with stretch gauze as directed. Secured With: 12M Medipore H Soft Cloth Surgical T ape, 4 x 10 (in/yd) (Generic) 1 x Per Day/30 Days Discharge Instructions: Secure with tape as directed. 1. In office sharp debridement 2. Santyl and Hydrofera Blue 3. Offloadingoosurgical shoe 4. Follow-up in 1 month Electronic Signature(s) Signed: 07/24/2022 11:32:14 AM  By: Geralyn Corwin DO Entered By: Geralyn Corwin on 07/24/2022 08:50:16 -------------------------------------------------------------------------------- HxROS Details Patient Name: Date of Service: [name]. 07/24/2022 8:00 A M Medical Record Number: [id number] Patient Account Number: [id number] Date of Birth/Sex: Treating RN: [birth year] (56 y.o. F) Primary Care Provider: Hillard Danker Other Clinician: Referring Provider: Treating Provider/Extender: Leandrew Koyanagi in Treatment: 3 Information Obtained From Patient Cardiovascular Medical History: Positive for: Hypertension Past Medical History Notes: Hyperlipidemia Endocrine Medical History: Positive for: Type II Diabetes Treated with: Insulin, Oral agents Blood sugar tested every day: No Neurologic Medical History: Positive for: Neuropathy Immunizations Pneumococcal Vaccine: Received Pneumococcal Vaccination: No TERRILYNN, POSTELL R ([id number]) [phone].pdf Page 10 of 10 Implantable Devices None Hospitalization / Surgery History Type of Hospitalization/Surgery right exicision haglund's deformity wih achilles tendon repair 2020 right 5th ray  amputation 06/02/2022 osteo with gangrene. Family and Social History Cancer: Yes - Father; Diabetes: Yes - Mother; Heart Disease: Yes - Father,Mother; Never smoker; Marital Status - Single; Alcohol Use: Never; Drug Use: No History; Caffeine Use: Daily; Financial Concerns: No; Food, Clothing or Shelter Needs: No; Support System Lacking: No; Transportation Concerns: No Electronic Signature(s) Signed: 07/24/2022 11:32:14 AM By: Geralyn CorwinHoffman, Bonita Brindisi DO Entered By: Geralyn CorwinHoffman, Shadeed Colberg on 07/24/2022 08:48:07 -------------------------------------------------------------------------------- SuperBill Details Patient Name: Date of Service: Tonny BollmanURRIN, Emersynn R. 07/24/2022 Medical Record Number: [id number] Patient Account Number:  [id number] Date of Birth/Sex: Treating RN: 03/23/1966 (56 y.o. Arta SilenceF) Deaton, Bobbi Primary Care Provider: Hillard Dankerrawford, Elizabeth Other Clinician: Referring Provider: Treating Provider/Extender: Leandrew KoyanagiHoffman, Christiann Hagerty Crawford, Elizabeth Weeks in Treatment: 3 Diagnosis Coding ICD-10 Codes Code Description S91.301A Unspecified open wound, right foot, initial encounter E11.621 Type 2 diabetes mellitus with foot ulcer I96 Gangrene, not elsewhere classified M86.9 Osteomyelitis, unspecified Facility Procedures : CPT4 Code: [id number] Description: 11042 - DEB SUBQ TISSUE 20 SQ CM/< ICD-10 Diagnosis Description S91.301A Unspecified open wound, right foot, initial encounter E11.621 Type 2 diabetes mellitus with foot ulcer Modifier: Quantity: 1 Physician Procedures : CPT4 Code Description Modifier [id number] 11042 - WC PHYS SUBQ TISS 20 SQ CM ICD-10 Diagnosis Description S91.301A Unspecified open wound, right foot, initial encounter E11.621 Type 2 diabetes mellitus with foot ulcer Quantity: 1 Electronic Signature(s) Signed: 07/24/2022 11:32:14 AM By: Geralyn CorwinHoffman, Litzi Binning DO Entered By: Geralyn CorwinHoffman, Emory Leaver on 07/24/2022 08:50:28

## 2022-07-26 ENCOUNTER — Other Ambulatory Visit (HOSPITAL_BASED_OUTPATIENT_CLINIC_OR_DEPARTMENT_OTHER): Payer: Self-pay

## 2022-07-26 ENCOUNTER — Other Ambulatory Visit: Payer: Self-pay | Admitting: Internal Medicine

## 2022-07-26 ENCOUNTER — Other Ambulatory Visit (HOSPITAL_COMMUNITY): Payer: Self-pay

## 2022-07-26 MED ORDER — ATORVASTATIN CALCIUM 80 MG PO TABS
80.0000 mg | ORAL_TABLET | Freq: Every day | ORAL | 2 refills | Status: DC
  Filled 2022-07-26: qty 90, 90d supply, fill #0
  Filled 2022-12-24: qty 90, 90d supply, fill #1
  Filled 2023-04-05: qty 90, 90d supply, fill #2

## 2022-08-02 ENCOUNTER — Encounter (HOSPITAL_BASED_OUTPATIENT_CLINIC_OR_DEPARTMENT_OTHER): Payer: 59 | Admitting: Internal Medicine

## 2022-08-02 DIAGNOSIS — I10 Essential (primary) hypertension: Secondary | ICD-10-CM | POA: Diagnosis not present

## 2022-08-02 DIAGNOSIS — Z833 Family history of diabetes mellitus: Secondary | ICD-10-CM | POA: Diagnosis not present

## 2022-08-02 DIAGNOSIS — E11621 Type 2 diabetes mellitus with foot ulcer: Secondary | ICD-10-CM | POA: Diagnosis not present

## 2022-08-02 DIAGNOSIS — E1152 Type 2 diabetes mellitus with diabetic peripheral angiopathy with gangrene: Secondary | ICD-10-CM | POA: Diagnosis not present

## 2022-08-02 DIAGNOSIS — S91301A Unspecified open wound, right foot, initial encounter: Secondary | ICD-10-CM

## 2022-08-02 DIAGNOSIS — E1140 Type 2 diabetes mellitus with diabetic neuropathy, unspecified: Secondary | ICD-10-CM | POA: Diagnosis not present

## 2022-08-02 DIAGNOSIS — L03115 Cellulitis of right lower limb: Secondary | ICD-10-CM | POA: Diagnosis not present

## 2022-08-02 NOTE — Progress Notes (Signed)
DIAHN, WAIDELICH ([id number]) [id number] Page 1 of 10 Visit Report for 08/02/2022 Chief Complaint Document Details Patient Name: Date of Service: Donna [name], Donna [name] 08/02/2022 8:00 A M Medical Record Number: [id number] Patient Account Number: [id number] Date of Birth/Sex: Treating RN: [birth year] (56 y.o. F) Primary Care Provider: Hillard Danker Other Clinician: Referring Provider: Treating Provider/Extender: Leandrew Koyanagi in Treatment: 4 Information Obtained from: Patient Chief Complaint 06/29/2022; surgical wound to the right foot status post right fifth ray amputation Electronic Signature(s) Signed: 08/02/2022 10:29:51 AM By: Geralyn Corwin DO Entered By: Geralyn Corwin on 08/02/2022 09:09:17 -------------------------------------------------------------------------------- Debridement Details Patient Name: Date of Service: Donna [name]. 08/02/2022 8:00 A M Medical Record Number: [id number] Patient Account Number: [id number] Date of Birth/Sex: Treating RN: 1965-09-29 (56 y.o. Ardis Rowan, [name] Primary Care Provider: Hillard Danker Other Clinician: Referring Provider: Treating Provider/Extender: Leandrew Koyanagi in Treatment: 4 Debridement Performed for Assessment: Wound #3 Right Amputation Site - Toe Performed By: Physician Geralyn Corwin, DO Debridement Type: Debridement Severity of Tissue Pre Debridement: Fat layer exposed Level of Consciousness (Pre-procedure): Awake and Alert Pre-procedure Verification/Time Out Yes - 08:50 Taken: Start Time: 08:50 Pain Control: Lidocaine T Area Debrided (L x W): otal 1.1 (cm) x 0.3 (cm) = 0.33 (cm) Tissue and other material debrided: Viable, Non-Viable, Slough, Subcutaneous, Slough Level: Skin/Subcutaneous Tissue Debridement Description: Excisional Instrument: Curette Bleeding: Minimum Hemostasis Achieved: Pressure End Time:  08:50 Procedural Pain: 0 Post Procedural Pain: 0 Response to Treatment: Procedure was tolerated well Level of Consciousness (Post- Awake and Alert procedure): Post Debridement Measurements of Total Wound Length: (cm) 1.1 Width: (cm) 0.3 Depth: (cm) 0.1 Volume: (cm) 0.026 Character of Wound/Ulcer Post Debridement: Improved Severity of Tissue Post Debridement: Fat layer exposed Donna [name], Donna [name] ([id number]) [id number] Page 2 of 10 Post Procedure Diagnosis Same as Pre-procedure Electronic Signature(s) Signed: 08/02/2022 10:29:51 AM By: Geralyn Corwin DO Signed: 08/02/2022 5:16:09 PM By: Fonnie Mu RN Entered By: Fonnie Mu on 08/02/2022 08:51:17 -------------------------------------------------------------------------------- HPI Details Patient Name: Date of Service: Donna [name]. 08/02/2022 8:00 A M Medical Record Number: [id number] Patient Account Number: [id number] Date of Birth/Sex: Treating RN: 01-07-1966 (56 y.o. 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 She tells me that since at least last summer she has had a progressive callused area that has opened closed but is never really healed. She has a history of calluses on her feet. She is a diabetic with neuropathy. She was referred to Dr. Allena Katz at [facility] who felt that this might be a large plantar wart she was treated with laser for 2 treatments and then subsequently with ammonium lactate none of this is apparently made any difference according to the patient. She has a very odd looking area with thick skin and callus and an almost crossed like area of open tissue. She also has erythema  above this which she says is chronic. The other interesting part of her recent history is recurrent cellulitis on the right lower leg although this is separated from the area we are looking at. She has had trips to see infectious disease has been on Keflex for as long as 20 days. This is presumed to be strep. She saw Dr. Renold Don Past medical history includes type 2 diabetes, recurrent cellulitis of the right leg,  right Achilles tendon repair a year ago for spontaneous rupture ABI in our clinic was 1.09 on the right 2/17; patient admitted the clinic last week had a thick callused area on the right lateral heel she is a diabetic with neuropathy. She had thick callus and across like area of open wound. I aggressively debrided this area to remove most of the callus and gave her Hydrofera Blue and heel off loader, heel cup and she has been wearing this religiously trying to keep the pressure off the area. She comes in today with a cross-like area of injury has healed she has a new open area that I do not remember from last time. Still some dry flaking skin and callus but not nearly as much as last week at which time I had to use a scalpel to remove this. 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 She came in 2 weeks ago with a very thick hyperkeratotic callused area on her right lateral heel. There was a "crossed" shaped area of split tissue with an open wound at the bottom. This had previously been treated as a possible wart without improvement. I removed all of the callus when she first came in here. This is mostly epithelialized over still thick skin but nothing like it was. She has 1 small open area that has not healed she has been more religious about offloading the area 10/27/2020 upon evaluation today patient appears to be doing well with regard to her wound although she does still apparently appear to have something still open in the base of the wound. Fortunately there is no  evidence of infection at this time which is great news and in general I feel like she is doing decently well though she has a lot of callus buildup she is using the offloading shoe for her heel. At home she does not always wear this she tells me. Generally she is using flip-flops there. Of note her primary care provider did give her Keflex yesterday for cellulitis of this leg but has nothing to do with the heel location. 3/17; the patient's area actually looks a lot better. There is no open wound here. Much less callus. She was given a prescription for 40% urea cream I am not sure that really is required this week. She is going to require less friction on this area. I think she is going to need a different type of shoe going forward Readmission: 12-13-2028 upon evaluation today patient presents for reevaluation in the clinic she was last seen by Dr. Leanord Hawking on 11-03-2020 when she was discharged this was due to a crack on her heel. Currently she is actually having an issue with her great toe on the right foot. She tells me that this has been present currently for a couple of weeks since around November 22, 2021. She has had a lot of callus over this area in general but not an open wound until that time. Subsequently this has more recently opened and has Become a greater concern she is also noted some odor which is disconcerting to her. She tells me that her greatest concern is that "she does not lose this toe". Again I completely understand her concern in this regard I definitely think we need to be aggressive in treating this and try to get things better. She is very Adult nurse. Patient does have a history of diabetes mellitus type 2 with diabetic neuropathy. She also tells me that she does have a history  as well of hypertension which is pretty well controlled. She has not yet had an x-ray of the area and has not been on any antibiotics. Both are things I think we will need to do today. 12-20-2021 upon  evaluation today patient appears to be doing well with regard to her toe ulcer all things considered. She has been tolerating the dressing changes without complication. I do have her x-ray as well as her culture for review today the culture did so Staphylococcus aureus no MRSA noted which was great news susceptible to doxycycline which is what I did place her on last week. Overall the odor that she was noticing has also cleared which is great news. With regard to the x-ray there does not appear to be any signs of osteomyelitis or bone erosion at this point which is also great news in general and very pleased with where things stand. 12-27-2021 upon evaluation today patient appears to be doing well with regard to her wound this is actually measuring significantly better which is great news. Overall I am extremely pleased with where we stand today. No fevers, chills, nausea, vomiting, or diarrhea. 01-03-2022 upon evaluation today patient's wound is actually showing signs of significant improvement which is also news. I am very pleased with what we are seeing there is a little bit of callus buildup still but we are going to work on that today otherwise she seems to be making excellent progress which is great news. Donna BollmanCURRIN, Lakeysha [name] ([id number]) [id number] Page 3 of 10 01-10-2022 upon evaluation today patient appears to be doing well currently in regard to her toe ulcer. She has been tolerating the dressing changes without complication. Fortunately there does not appear to be any evidence of active infection locally or systemically which is great news. No fevers, chills, nausea, vomiting, or diarrhea. 01-17-2022 upon evaluation today patient's toe was actually doing about the same this week there is not a tremendous improvement in size but overall the appearance of the wound is better it is much flatter I do think the [facility] has done well in that regard. We will still wait  to hear back on the skin substitute. With that being said I do believe that the patient could benefit from again the skin substitute to try to help get this to heal much more effectively and quickly in the meantime I am trying to help flatten this out which I think is doing a pretty good job.. 6/7; wound on the plantar right great toe over the distal phalanx. Wound is slightly smaller but looks healthier there is rims of epithelialization she still has some callus on the toe indicative of inadequate pressure/friction relief. I talked to her about making sure in the forefoot offloading boot that the weight stays on her heel in stance phase 01-31-2022 upon evaluation patient's wound is still continuing to show signs of really minimal progress unfortunately. I actually believe that she may need to go into the total contact cast in order to get this to heal and I discussed that with her today as well. Obviously this has been a little bit of a barrier for her not because she does not want to do it and does not want to get healed but rather because it is going to prevent her from being able to drive which is obviously going to be a significant quality-of-life issue with what she does in the position she is in at work. 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 I do believe she could work with the cast on but the issue simply is that she cannot drive to get to work. Otherwise should be able to walk and do what she needs to do. 02-07-2022 upon evaluation today patient appears to be doing well with regard to her wounds. She has been tolerating the dressing changes without complication. Fortunately I do not see any signs of infection although the wound itself is not significantly smaller as of yet. This is something that we are trying to work on I do believe a total contact cast would be the right way to go. I discussed this with the patient today as  well. She is actually in agreement with giving this a trial although she is a little nervous about it being that she is claustrophobic. 6/23; patient presents for obligatory cast exchange. She reports that her heel feels sore from the total contact cast. On that she had no issues. 02-14-2022 upon evaluation today patient's wound is actually showing signs of excellent improvement. There does not appear to be any signs of active infection at this time and overall the toe looks amazing in just 1 week's time. We have come down almost half compared to where we were before size wise and this is also him as well. Overall I think that the casting has done extremely good for her and the toe in general looks significantly improved. I think this week since we want to have to mess with the wound bed itself just the callus around she will likely have even better improvement come next time. 02-21-2022 upon evaluation today patient appears to be doing well with regard to her toe. She is actually showing signs of improvement this is a little bit slower than she would like to see but nonetheless we are seeing significant changes in the right direction in my opinion. Fortunately she is also developing a lot less callus on the end of the toe which is helpful for her as well in my opinion. 02-28-2022 upon evaluation today patient appears to be doing well currently in regard to her wound. She has been tolerating the dressing changes. Fortunately I do not see any signs of infection I think the cast is doing an awesome job here. No fevers, chills, nausea, vomiting, or diarrhea. 03-07-2022 upon evaluation today patient appears to be doing well with regard to her wound although the collagen is very stuck and dry on the area on the tip of her toe at this point. Nonetheless I discussed with her that I am a little reluctant to put the cast on this week due to what appeared to been a blister along the left side of her toe laterally.  Nonetheless this has been a little worried about the possibility of infection and I do not see anything obvious I think that we may not want to put the cast on this week just to be on the safe side she voiced understanding. With that being said if were not to use the cast she is going to need to make sure to have still some pressure relief I think making gauze bolster with 3 gauze up underneath the toe is going to do a good job here. We will roll and tape this and then subsequently anchored underneath the big toe should be using the front offloading shoe as well. 7/26; patient I know from a previous stay in this clinic. Type II diabetic with  a wound on her right plantar great toe. She was in a total contact cast to 2 weeks ago the wound was close to closed last week according to our intake nurse. We put Xeroform and gauze on this that the patient has been changing her self. She has a forefoot offloading boot. 03-21-2022 upon a variable evaluation today patient appears to be doing somewhat poorly in regard to callus buildup at this time. Fortunately I do not see any evidence of overall worsening but this is just still open. No fevers, chills, nausea, vomiting, or diarrhea. I think we may have to go back into the cast next week if this is not significantly improved by that time. 03-28-2022 upon evaluation today patient's wound is actually showing signs of significant improvement which is great news. The cast over the past week he has done a great job. This is so small however but still has a little bit of a deeper area that I am concerned about the possibility of it drying out too much under the cast that is what we saw today and callused over. Subsequently I did remove the callus. 04-11-2022 upon evaluation today patient appears to still have a lot of callus buildup today. She was sick with COVID last weeks has been 2 weeks since have seen her. This is what accounts for some of the callus buildup in my  opinion. With that being said she tells me she was not nearly as active working from home with COVID as she can go in. Nonetheless I do believe still that based on what we are seeing this is still too much friction is likely occurring around the toe area. 04-18-2022 upon evaluation today patient appears to be doing excellent in regard to her wound in fact this appears to be completely healed which is great news. Fortunately I do not see any evidence of active infection locally or systemically at this time which is excellent as well. No fevers, chills, nausea, vomiting, or diarrhea. 06/29/2022 Ms. Donna [name] is a 56 year old female with a past medical history of uncontrolled type 2 diabetes that presents to the clinic for a wound to the right foot. On 06/02/2022 she presented to the hospital for cellulitis of the right foot and found to have osteomyelitis of the fifth toe. She had a 5th ray amputation on 10/14. Her culture grew Staph aureus. She was placed on 10 days of linezolid by infectious disease. She has been doing a dry dressing for the past 3-4 weeks. She currently denies systemic signs of infection. 11/17; patient presents for follow-up. She has been using Santyl and silver alginate to the wound bed daily. She reports using Vashe wound cleanser between dressing changes. She is taking doxycycline currently. She has no issues or complaints today. 11/28; patient presents for follow-up. She has been using Santyl and silver alginate to the wound bed without issues. She completed her course of doxycycline. She denies signs of infection. 12/5; patient presents for follow-up. She has been using Santyl and silver alginate to the wound bed. She has no issues or complaints today. She denies signs of infection. 12/14; patient presents for follow-up. She has been using Santyl and Hydrofera Blue to the wound bed. There is been improvement in wound healing. She denies signs of infection. Electronic  Signature(s) Signed: 08/02/2022 10:29:51 AM By: Geralyn Corwin DO Entered By: Geralyn Corwin on 08/02/2022 09:10:08 Donna [name] ([id number]) [id number] Page 4 of 10 -------------------------------------------------------------------------------- Physical Exam Details Patient Name: Date of Service: Donna [name],  Donna [name]. 08/02/2022 8:00 A M Medical Record Number: [id number] Patient Account Number: [id number] Date of Birth/Sex: Treating RN: [birth year] (56 y.o. F) Primary Care Provider: Hillard Danker Other Clinician: Referring Provider: Treating Provider/Extender: Leandrew Koyanagi in Treatment: 4 Constitutional respirations regular, non-labored and within target range for patient.. Cardiovascular 2+ dorsalis pedis/posterior tibialis pulses. Psychiatric pleasant and cooperative. Notes Right foot: T the lateral/dorsal aspect there is an open wound with granulation tissue and nonviable tissue. No signs of infection and bleeding increased o warmth, erythema or purulent drainage. Electronic Signature(s) Signed: 08/02/2022 10:29:51 AM By: Geralyn Corwin DO Entered By: Geralyn Corwin on 08/02/2022 09:11:26 -------------------------------------------------------------------------------- Physician Orders Details Patient Name: Date of Service: Donna [name]. 08/02/2022 8:00 A M Medical Record Number: [id number] Patient Account Number: [id number] Date of Birth/Sex: Treating RN: 10-29-1965 (56 y.o. Ardis Rowan, [name] Primary Care Provider: Hillard Danker Other Clinician: Referring Provider: Treating Provider/Extender: Leandrew Koyanagi in Treatment: 4 Verbal / Phone Orders: No Diagnosis Coding Follow-up Appointments ppointment in 1 week. - Dr. Mikey Bussing Return A ppointment in 2 weeks. - Dr. Mikey Bussing Return A Anesthetic (In clinic) Topical Lidocaine 4% applied to wound bed Bathing/  Shower/ Hygiene May shower with protection but do not get wound dressing(s) wet. Edema Control - Lymphedema / SCD / Other Elevate legs to the level of the heart or above for 30 minutes daily and/or when sitting, a frequency of: - throughout the day. Avoid standing for long periods of time. Off-Loading Open toe surgical shoe to: - office to provide a new surgical shoe. ensure no pressure to right lateral foot. Wound Treatment Wound #3 - Amputation Site - Toe Wound Laterality: Right Cleanser: Vashe 1 x Per Day/30 Days Discharge Instructions: use to cleanse wound daily. Donna [name], Donna [name] ([id number]) [id number] Page 5 of 10 Prim Dressing: Hydrofera Blue Ready Transfer Foam, 2.5x2.5 (in/in) 1 x Per Day/30 Days ary Discharge Instructions: Apply directly to wound bed as directed Prim Dressing: Santyl Ointment 1 x Per Day/30 Days ary Discharge Instructions: Apply in office today and use once it arrives at home instead of medihoney. Secondary Dressing: Woven Gauze Sponge, Non-Sterile 4x4 in (Generic) 1 x Per Day/30 Days Discharge Instructions: Apply over primary dressing as directed. Secured With: Insurance underwriter, Sterile 2x75 (in/in) (Generic) 1 x Per Day/30 Days Discharge Instructions: Secure with stretch gauze as directed. Secured With: 76M Medipore H Soft Cloth Surgical T ape, 4 x 10 (in/yd) (Generic) 1 x Per Day/30 Days Discharge Instructions: Secure with tape as directed. Electronic Signature(s) Signed: 08/02/2022 10:29:51 AM By: Geralyn Corwin DO Entered By: Geralyn Corwin on 08/02/2022 09:11:34 -------------------------------------------------------------------------------- Problem List Details Patient Name: Date of Service: Donna [name]. 08/02/2022 8:00 A M Medical Record Number: [id number] Patient Account Number: [id number] Date of Birth/Sex: Treating RN: 05-11-1966 (56 y.o. F) Primary Care Provider: Hillard Danker Other  Clinician: Referring Provider: Treating Provider/Extender: Leandrew Koyanagi in Treatment: 4 Active Problems ICD-10 Encounter Code Description Active Date MDM Diagnosis S91.301A Unspecified open wound, right foot, initial encounter 06/29/2022 No Yes E11.621 Type 2 diabetes mellitus with foot ulcer 06/29/2022 No Yes I96 Gangrene, not elsewhere classified 06/29/2022 No Yes M86.9 Osteomyelitis, unspecified 06/29/2022 No Yes Inactive Problems Resolved Problems Electronic Signature(s) Signed: 08/02/2022 10:29:51 AM By: Geralyn Corwin DO Entered By: Geralyn Corwin on 08/02/2022 09:09:02 Donna [name] ([id number]) [id number] Page 6 of 10 -------------------------------------------------------------------------------- Progress Note Details Patient Name: Date of Service: Donna [name], Donna [name] 08/02/2022 8:00 A M Medical Record Number: [id number] Patient  Account Number: [id number] Date of Birth/Sex: Treating RN: 12/17/1965 (56 y.o. 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 She tells me that since at least last summer she has had a progressive callused area that has opened closed but is never really healed. She has a history of calluses on her feet. She is a diabetic with neuropathy. She was referred to Dr. Allena Katz at [facility] who felt that this might be a large plantar wart she was treated with laser for 2 treatments and then subsequently with ammonium lactate none of this is apparently made any difference according  to the patient. She has a very odd looking area with thick skin and callus and an almost crossed like area of open tissue. She also has erythema above this which she says is chronic. The other interesting part of her recent history is recurrent cellulitis on the right lower leg although this is separated from the area we are looking at. She has had trips to see infectious disease has been on Keflex for as long as 20 days. This is presumed to be strep. She saw Dr. Renold Don Past medical history includes type 2 diabetes, recurrent cellulitis of the right leg, right Achilles tendon repair a year ago for spontaneous rupture ABI in our clinic was 1.09 on the right 2/17; patient admitted the clinic last week had a thick callused area on the right lateral heel she is a diabetic with neuropathy. She had thick callus and across like area of open wound. I aggressively debrided this area to remove most of the callus and gave her Hydrofera Blue and heel off loader, heel cup and she has been wearing this religiously trying to keep the pressure off the area. She comes in today with a cross-like area of injury has healed she has a new open area that I do not remember from last time. Still some dry flaking skin and callus but not nearly as much as last week at which time I had to use a scalpel to remove this. 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 She came in 2 weeks ago with a very thick hyperkeratotic callused area on her right lateral heel. There was a "crossed" shaped area of split tissue with an open wound at the bottom. This had previously been treated as a possible wart without improvement. I removed all of the callus when she first came in here. This is mostly epithelialized over still thick skin but nothing like it was. She has 1 small open area that has not healed she has been more religious about offloading the area 10/27/2020 upon evaluation today patient appears to be doing well with  regard to her wound although she does still apparently appear to have something still open in the base of the wound. Fortunately there is no evidence of infection at this time which is great news and in general I feel like she is doing decently well though she has a lot of callus buildup she is using the offloading shoe for her heel. At home she does not always wear this she tells me. Generally she is using flip-flops there. Of note her primary  care provider did give her Keflex yesterday for cellulitis of this leg but has nothing to do with the heel location. 3/17; the patient's area actually looks a lot better. There is no open wound here. Much less callus. She was given a prescription for 40% urea cream I am not sure that really is required this week. She is going to require less friction on this area. I think she is going to need a different type of shoe going forward Readmission: 12-13-2028 upon evaluation today patient presents for reevaluation in the clinic she was last seen by Dr. Leanord Hawking on 11-03-2020 when she was discharged this was due to a crack on her heel. Currently she is actually having an issue with her great toe on the right foot. She tells me that this has been present currently for a couple of weeks since around November 22, 2021. She has had a lot of callus over this area in general but not an open wound until that time. Subsequently this has more recently opened and has Become a greater concern she is also noted some odor which is disconcerting to her. She tells me that her greatest concern is that "she does not lose this toe". Again I completely understand her concern in this regard I definitely think we need to be aggressive in treating this and try to get things better. She is very Adult nurse. Patient does have a history of diabetes mellitus type 2 with diabetic neuropathy. She also tells me that she does have a history as well of hypertension which is pretty well controlled. She has  not yet had an x-ray of the area and has not been on any antibiotics. Both are things I think we will need to do today. 12-20-2021 upon evaluation today patient appears to be doing well with regard to her toe ulcer all things considered. She has been tolerating the dressing changes without complication. I do have her x-ray as well as her culture for review today the culture did so Staphylococcus aureus no MRSA noted which was great news susceptible to doxycycline which is what I did place her on last week. Overall the odor that she was noticing has also cleared which is great news. With regard to the x-ray there does not appear to be any signs of osteomyelitis or bone erosion at this point which is also great news in general and very pleased with where things stand. 12-27-2021 upon evaluation today patient appears to be doing well with regard to her wound this is actually measuring significantly better which is great news. Overall I am extremely pleased with where we stand today. No fevers, chills, nausea, vomiting, or diarrhea. 01-03-2022 upon evaluation today patient's wound is actually showing signs of significant improvement which is also news. I am very pleased with what we are seeing there is a little bit of callus buildup still but we are going to work on that today otherwise she seems to be making excellent progress which is great news. 01-10-2022 upon evaluation today patient appears to be doing well currently in regard to her toe ulcer. She has been tolerating the dressing changes without complication. Fortunately there does not appear to be any evidence of active infection locally or systemically which is great news. No fevers, chills, nausea, vomiting, or diarrhea. 01-17-2022 upon evaluation today patient's toe was actually doing about the same this week there is not a tremendous improvement in size but overall the appearance of the wound is better it is much flatter  I do think the Hydrofera Blue  has done well in that regard. We will still wait to hear back on the skin substitute. With that being said I do believe that the patient could benefit from again the skin substitute to try to help get this to heal much more effectively and quickly in the meantime I am trying to help flatten this out which I think is doing a pretty good job.. 6/7; wound on the plantar right great toe over the distal phalanx. Wound is slightly smaller but looks healthier there is rims of epithelialization she still has some Donna [name], Donna [name] ([id number]) [id number] Page 7 of 10 callus on the toe indicative of inadequate pressure/friction relief. I talked to her about making sure in the forefoot offloading boot that the weight stays on her heel in stance phase 01-31-2022 upon evaluation patient's wound is still continuing to show signs of really minimal progress unfortunately. I actually believe that she may need to go into the total contact cast in order to get this to heal and I discussed that with her today as well. Obviously this has been a little bit of a barrier for her not because she does not want to do it and does not want to get healed but rather because it is going to prevent her from being able to drive which is obviously going to be a significant quality-of-life issue with what she does in the position she is in at work. 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 I do believe she could work with the cast on but the issue simply is that she cannot drive to get to work. Otherwise should be able to walk and do what she needs to do. 02-07-2022 upon evaluation today patient appears to be doing well with regard to her wounds. She has been tolerating the dressing changes without complication. Fortunately I do not see any signs of infection although the wound itself is not significantly smaller as of yet. This is something that  we are trying to work on I do believe a total contact cast would be the right way to go. I discussed this with the patient today as well. She is actually in agreement with giving this a trial although she is a little nervous about it being that she is claustrophobic. 6/23; patient presents for obligatory cast exchange. She reports that her heel feels sore from the total contact cast. On that she had no issues. 02-14-2022 upon evaluation today patient's wound is actually showing signs of excellent improvement. There does not appear to be any signs of active infection at this time and overall the toe looks amazing in just 1 week's time. We have come down almost half compared to where we were before size wise and this is also him as well. Overall I think that the casting has done extremely good for her and the toe in general looks significantly improved. I think this week since we want to have to mess with the wound bed itself just the callus around she will likely have even better improvement come next time. 02-21-2022 upon evaluation today patient appears to be doing well with regard to her toe. She is actually showing signs of improvement this is a little bit slower than she would like to see but nonetheless we are seeing significant changes in the right direction in my opinion. Fortunately she is also developing a lot less callus  on the end of the toe which is helpful for her as well in my opinion. 02-28-2022 upon evaluation today patient appears to be doing well currently in regard to her wound. She has been tolerating the dressing changes. Fortunately I do not see any signs of infection I think the cast is doing an awesome job here. No fevers, chills, nausea, vomiting, or diarrhea. 03-07-2022 upon evaluation today patient appears to be doing well with regard to her wound although the collagen is very stuck and dry on the area on the tip of her toe at this point. Nonetheless I discussed with her that I am  a little reluctant to put the cast on this week due to what appeared to been a blister along the left side of her toe laterally. Nonetheless this has been a little worried about the possibility of infection and I do not see anything obvious I think that we may not want to put the cast on this week just to be on the safe side she voiced understanding. With that being said if were not to use the cast she is going to need to make sure to have still some pressure relief I think making gauze bolster with 3 gauze up underneath the toe is going to do a good job here. We will roll and tape this and then subsequently anchored underneath the big toe should be using the front offloading shoe as well. 7/26; patient I know from a previous stay in this clinic. Type II diabetic with a wound on her right plantar great toe. She was in a total contact cast to 2 weeks ago the wound was close to closed last week according to our intake nurse. We put Xeroform and gauze on this that the patient has been changing her self. She has a forefoot offloading boot. 03-21-2022 upon a variable evaluation today patient appears to be doing somewhat poorly in regard to callus buildup at this time. Fortunately I do not see any evidence of overall worsening but this is just still open. No fevers, chills, nausea, vomiting, or diarrhea. I think we may have to go back into the cast next week if this is not significantly improved by that time. 03-28-2022 upon evaluation today patient's wound is actually showing signs of significant improvement which is great news. The cast over the past week he has done a great job. This is so small however but still has a little bit of a deeper area that I am concerned about the possibility of it drying out too much under the cast that is what we saw today and callused over. Subsequently I did remove the callus. 04-11-2022 upon evaluation today patient appears to still have a lot of callus buildup today. She was  sick with COVID last weeks has been 2 weeks since have seen her. This is what accounts for some of the callus buildup in my opinion. With that being said she tells me she was not nearly as active working from home with COVID as she can go in. Nonetheless I do believe still that based on what we are seeing this is still too much friction is likely occurring around the toe area. 04-18-2022 upon evaluation today patient appears to be doing excellent in regard to her wound in fact this appears to be completely healed which is great news. Fortunately I do not see any evidence of active infection locally or systemically at this time which is excellent as well. No fevers, chills, nausea, vomiting,  or diarrhea. 06/29/2022 Ms. Donna [name] is a 56 year old female with a past medical history of uncontrolled type 2 diabetes that presents to the clinic for a wound to the right foot. On 06/02/2022 she presented to the hospital for cellulitis of the right foot and found to have osteomyelitis of the fifth toe. She had a 5th ray amputation on 10/14. Her culture grew Staph aureus. She was placed on 10 days of linezolid by infectious disease. She has been doing a dry dressing for the past 3-4 weeks. She currently denies systemic signs of infection. 11/17; patient presents for follow-up. She has been using Santyl and silver alginate to the wound bed daily. She reports using Vashe wound cleanser between dressing changes. She is taking doxycycline currently. She has no issues or complaints today. 11/28; patient presents for follow-up. She has been using Santyl and silver alginate to the wound bed without issues. She completed her course of doxycycline. She denies signs of infection. 12/5; patient presents for follow-up. She has been using Santyl and silver alginate to the wound bed. She has no issues or complaints today. She denies signs of infection. 12/14; patient presents for follow-up. She has been using Santyl  and Hydrofera Blue to the wound bed. There is been improvement in wound healing. She denies signs of infection. Patient History Information obtained from Patient. Family History Cancer - Father, Diabetes - Mother, Heart Disease - Father,Mother. Social History Never smoker, Marital Status - Single, Alcohol Use - Never, Drug Use - No History, Caffeine Use - Daily. 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 Medical A Surgical History Notes nd Cardiovascular Hyperlipidemia Objective Constitutional respirations regular, non-labored and within target range for patient.. Vitals Time Taken: 8:16 AM, Respiratory Rate: 17 breaths/min. Cardiovascular 2+ dorsalis pedis/posterior tibialis pulses. Psychiatric pleasant and cooperative. General Notes: Right foot: T the lateral/dorsal aspect there is an open wound with granulation tissue and nonviable tissue. No signs of infection and bleeding o increased warmth, erythema or purulent drainage. Integumentary (Hair, Skin) Wound #3 status is Open. Original cause of wound was Surgical Injury. The date acquired was: 05/21/2022. The wound has been in treatment 4 weeks. The wound is located on the Right Amputation Site - T The wound measures 1.1cm length x 0.3cm width x 0.1cm depth; 0.259cm^2 area and 0.026cm^3 volume. oe. There is Fat Layer (Subcutaneous Tissue) exposed. There is a medium amount of serosanguineous drainage noted. The wound margin is distinct with the outline attached to the wound base. There is medium (34-66%) pink, pale granulation within the wound bed. There is a medium  (34-66%) amount of necrotic tissue within the wound bed including Adherent Slough. The periwound skin appearance exhibited: Callus. The periwound skin appearance did not exhibit: Crepitus, Excoriation, Induration, Rash, Scarring, Dry/Scaly, Maceration, Atrophie Blanche, Cyanosis, Ecchymosis, Hemosiderin Staining, Mottled, Pallor, Rubor, Erythema. Periwound temperature was noted as No Abnormality. Assessment Active Problems ICD-10 Unspecified open wound, right foot, initial encounter Type 2 diabetes mellitus with foot ulcer Gangrene, not elsewhere classified Osteomyelitis, unspecified Patient's wound has shown improvement in size in appearance since last clinic visit. I debrided nonviable tissue. I recommended continuing the course with Santyl and Hydrofera Blue. Continue aggressive offloading. She has a surgical shoe. Procedures Wound #3 Pre-procedure diagnosis  of Wound #3 is a Diabetic Wound/Ulcer of the Lower Extremity located on the Right Amputation Site - T .Severity of Tissue Pre oe Debridement is: Fat layer exposed. There was a Excisional Skin/Subcutaneous Tissue Debridement with a total area of 0.33 sq cm performed by Geralyn Corwin, DO. With the following instrument(s): Curette to remove Viable and Non-Viable tissue/material. Material removed includes Subcutaneous Tissue and Slough and after achieving pain control using Lidocaine. No specimens were taken. A time out was conducted at 08:50, prior to the start of the procedure. A Minimum amount of bleeding was controlled with Pressure. The procedure was tolerated well with a pain level of 0 throughout and a pain level of 0 following the procedure. Post Debridement Measurements: 1.1cm length x 0.3cm width x 0.1cm depth; 0.026cm^3 volume. Character of Wound/Ulcer Post Debridement is improved. Severity of Tissue Post Debridement is: Fat layer exposed. Post procedure Diagnosis Wound #3: Same as Pre-Procedure Plan Follow-up  Appointments: Return Appointment in 1 week. - Dr. Dede Query, [facility] [name] ([id number]) [id number] Page 9 of 10 Return Appointment in 2 weeks. - Dr. Mikey Bussing Anesthetic: (In clinic) Topical Lidocaine 4% applied to wound bed Bathing/ Shower/ Hygiene: May shower with protection but do not get wound dressing(s) wet. Edema Control - Lymphedema / SCD / Other: Elevate legs to the level of the heart or above for 30 minutes daily and/or when sitting, a frequency of: - throughout the day. Avoid standing for long periods of time. Off-Loading: Open toe surgical shoe to: - office to provide a new surgical shoe. ensure no pressure to right lateral foot. WOUND #3: - Amputation Site - T oe Wound Laterality: Right Cleanser: Vashe 1 x Per Day/30 Days Discharge Instructions: use to cleanse wound daily. Prim Dressing: Hydrofera Blue Ready Transfer Foam, 2.5x2.5 (in/in) 1 x Per Day/30 Days ary Discharge Instructions: Apply directly to wound bed as directed Prim Dressing: Santyl Ointment 1 x Per Day/30 Days ary Discharge Instructions: Apply in office today and use once it arrives at home instead of medihoney. Secondary Dressing: Woven Gauze Sponge, Non-Sterile 4x4 in (Generic) 1 x Per Day/30 Days Discharge Instructions: Apply over primary dressing as directed. Secured With: Insurance underwriter, Sterile 2x75 (in/in) (Generic) 1 x Per Day/30 Days Discharge Instructions: Secure with stretch gauze as directed. Secured With: 6M Medipore H Soft Cloth Surgical T ape, 4 x 10 (in/yd) (Generic) 1 x Per Day/30 Days Discharge Instructions: Secure with tape as directed. 1. In office sharp debridement 2. Santyl and Hydrofera Blue 3. Aggressive offloadingoosurgical shoe 4. Follow-up in 1 week Electronic Signature(s) Signed: 08/02/2022 10:29:51 AM By: Geralyn Corwin DO Entered By: Geralyn Corwin on 08/02/2022  09:14:16 -------------------------------------------------------------------------------- HxROS Details Patient Name: Date of Service: Donna [name]. 08/02/2022 8:00 A M Medical Record Number: [id number] Patient Account Number: [id number] Date of Birth/Sex: Treating RN: 1966-02-06 (56 y.o. F) Primary Care Provider: Hillard Danker Other Clinician: Referring Provider: Treating Provider/Extender: Leandrew Koyanagi in Treatment: 4 Information Obtained From Patient Cardiovascular Medical History: Positive for: Hypertension Past Medical History Notes: Hyperlipidemia Endocrine Medical History: Positive for: Type II Diabetes Treated with: Insulin, Oral agents Blood sugar tested every day: No Neurologic Medical History: Positive for: Neuropathy Immunizations Pneumococcal Vaccine: Received Pneumococcal Vaccination: No Donna [name], Donna [name] ([id number]) [phone].pdf Page 10 of 10 Implantable Devices None Hospitalization / Surgery History Type of Hospitalization/Surgery right exicision haglund's deformity wih achilles tendon repair 2020 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 Family and Social History Cancer: Yes - Father; Diabetes: Yes -  Mother; Heart Disease: Yes - Father,Mother; Never smoker; Marital Status - Single; Alcohol Use: Never; Drug Use: No History; Caffeine Use: Daily; Financial Concerns: No; Food, Clothing or Shelter Needs: No; Support System Lacking: No; Transportation Concerns: No Electronic Signature(s) Signed: 08/02/2022 10:29:51 AM By: Geralyn Corwin DO Entered By: Geralyn Corwin on 08/02/2022 09:10:13 -------------------------------------------------------------------------------- SuperBill Details Patient Name: Date of Service: Donna [name] 08/02/2022 Medical Record Number: [id number] Patient Account Number: [id number] Date of Birth/Sex: Treating RN: April 26, 1966 (56 y.o. Ardis Rowan, [name] Primary Care Provider: Hillard Danker Other Clinician: Referring Provider: Treating Provider/Extender: Leandrew Koyanagi in Treatment: 4 Diagnosis Coding ICD-10 Codes Code Description S91.301A Unspecified open wound, right foot, initial encounter E11.621 Type 2 diabetes mellitus with foot ulcer I96 Gangrene, not elsewhere classified M86.9 Osteomyelitis, unspecified Facility Procedures : CPT4 Code: [id number] Description: 11042 - DEB SUBQ TISSUE 20 SQ CM/< ICD-10 Diagnosis Description S91.301A Unspecified open wound, right foot, initial encounter E11.621 Type 2 diabetes mellitus with foot ulcer Modifier: Quantity: 1 Physician Procedures : CPT4 Code Description Modifier [id number] 11042 - WC PHYS SUBQ TISS 20 SQ CM ICD-10 Diagnosis Description S91.301A Unspecified open wound, right foot, initial encounter E11.621 Type 2 diabetes mellitus with foot ulcer Quantity: 1 Electronic Signature(s) Signed: 08/02/2022 10:29:51 AM By: Geralyn Corwin DO Entered By: Geralyn Corwin on 08/02/2022 09:14:25

## 2022-08-02 NOTE — Progress Notes (Signed)
Donna [name] Page 1 of 8 Visit Report for 08/02/2022 Arrival Information Details Patient Name: Date of Service: Donna [name], Donna [name] 08/02/2022 8:00 A M Medical Record Number: [id number] Patient Account Number: [id number] Date of Birth/Sex: Treating [name]: 1965/09/02 (56 y.o. Donna [name] Primary Care Tia Hieronymus: Hillard Danker Other Clinician: Referring Daleena Rotter: Treating Lewin Pellow/Extender: Leandrew Koyanagi in Treatment: 4 Visit Information History Since Last Visit Added or deleted any medications: No Patient Arrived: Ambulatory Any new allergies or adverse reactions: No Arrival Time: 08:12 Had a fall or experienced change in No Accompanied By: self activities of daily living that may affect Transfer Assistance: None risk of falls: Patient Identification Verified: Yes Signs or symptoms of abuse/neglect since last visito No Secondary Verification Process Completed: Yes Hospitalized since last visit: No Patient Requires Transmission-Based Precautions: No Implantable device outside of the clinic excluding No Patient Has Alerts: Yes cellular tissue based products placed in the center Patient Alerts: ABI's: [name]:1.16 11/2021 since last visit: Has Dressing in Place as Prescribed: Yes Pain Present Now: No Electronic Signature(s) Signed: 08/02/2022 5:16:09 PM By: Donna [name] Entered By: Donna Mu on 08/02/2022 08:13:13 -------------------------------------------------------------------------------- Encounter Discharge Information Details Patient Name: Date of Service: Donna [name]. 08/02/2022 8:00 A M Medical Record Number: [id number] Patient Account Number: [id number] Date of Birth/Sex: Treating [name]: Sep 23, 1965 (56 y.o. Donna [name] Primary Care Reilly Molchan: Hillard Danker Other Clinician: Referring Trenita Hulme: Treating Clarence Dunsmore/Extender: Leandrew Koyanagi in Treatment: 4 Encounter Discharge Information Items Post Procedure Vitals Discharge Condition: Stable Temperature (F): 98 Ambulatory Status: Ambulatory Pulse (bpm): 74 Discharge Destination: Home Respiratory Rate (breaths/min): 17 Transportation: Private Auto Blood Pressure (mmHg): 134/74 Accompanied By: self Schedule Follow-up Appointment: Yes Clinical Summary of Care: Patient Declined Electronic Signature(s) Signed: 08/02/2022 5:16:09 PM By: Donna [name] Entered By: Donna Mu on 08/02/2022 08:54:26 [name], [name] ([id number]) [id number] Page 2 of 8 -------------------------------------------------------------------------------- Lower Extremity Assessment Details Patient Name: Date of Service: Donna [name] 08/02/2022 8:00 A M Medical Record Number: [id number] Patient Account Number: [id number] Date of Birth/Sex: Treating [name]: 05/14/1966 (56 y.o. Donna [name] Primary Care Rakia Frayne: Hillard Danker Other Clinician: Referring Shana Zavaleta: Treating Gaelle Adriance/Extender: Leandrew Koyanagi in Treatment: 4 Edema Assessment Assessed: [Left: No] [Right: Yes] Edema: [Left: N] [Right: o] Calf Left: Right: Point of Measurement: 31 cm From Medial Instep 37 cm Ankle Left: Right: Point of Measurement: 11 cm From Medial Instep 22 cm Vascular Assessment Pulses: Dorsalis Pedis Palpable: [Right:Yes] Posterior Tibial Palpable: [Right:Yes] Electronic Signature(s) Signed: 08/02/2022 5:16:09 PM By: Donna [name] Entered By: Donna Mu on 08/02/2022 08:14:33 -------------------------------------------------------------------------------- Multi Wound Chart Details Patient Name: Date of Service: Donna [name]. 08/02/2022 8:00 A M Medical Record Number: [id number] Patient Account Number: [id number] Date of Birth/Sex: Treating [name]: January 19, 1966 (57 y.o. F) Primary Care Tyger Oka:  Hillard Danker Other Clinician: Referring Demontez Novack: Treating Anderson Middlebrooks/Extender: Leandrew Koyanagi in Treatment: 4 Vital Signs Height(in): Pulse(bpm): Weight(lbs): Blood Pressure(mmHg): Body Mass Index(BMI): Temperature(F): Respiratory Rate(breaths/min): 17 [3:Photos:] [N/A:N/A [name] Page 3 of 8] Right Amputation Site - T oe N/A N/A Wound Location: Surgical Injury N/A N/A Wounding Event: Diabetic Wound/Ulcer of the Lower N/A N/A Primary Etiology: Extremity Open Surgical Wound N/A N/A Secondary Etiology: Hypertension, Type II Diabetes, N/A N/A Comorbid History: Neuropathy 05/21/2022 N/A N/A Date Acquired: 4 N/A N/A Weeks of Treatment: Open N/A N/A Wound Status: No N/A N/A Wound Recurrence: 1.1x0.3x0.1 N/A N/A Measurements L x W x D (cm) 0.259 N/A N/A A (  cm) : rea 0.026 N/A N/A Volume (cm) : 94.70% N/A N/A % Reduction in A rea: 99.40% N/A N/A % Reduction in Volume: Grade 4 N/A N/A Classification: Medium N/A N/A Exudate A mount: Serosanguineous N/A N/A Exudate Type: red, brown N/A N/A Exudate Color: Distinct, outline attached N/A N/A Wound Margin: Medium (34-66%) N/A N/A Granulation A mount: Pink, Pale N/A N/A Granulation Quality: Medium (34-66%) N/A N/A Necrotic A mount: Fat Layer (Subcutaneous Tissue): Yes N/A N/A Exposed Structures: Fascia: No Tendon: No Muscle: No Joint: No Bone: No None N/A N/A Epithelialization: Debridement - Excisional N/A N/A Debridement: Pre-procedure Verification/Time Out 08:50 N/A N/A Taken: Lidocaine N/A N/A Pain Control: Subcutaneous, Slough N/A N/A Tissue Debrided: Skin/Subcutaneous Tissue N/A N/A Level: 0.33 N/A N/A Debridement A (sq cm): rea Curette N/A N/A Instrument: Minimum N/A N/A Bleeding: Pressure N/A N/A Hemostasis A chieved: 0 N/A N/A Procedural Pain: 0 N/A N/A Post Procedural Pain: Procedure was tolerated well N/A  N/A Debridement Treatment Response: 1.1x0.3x0.1 N/A N/A Post Debridement Measurements L x W x D (cm) 0.026 N/A N/A Post Debridement Volume: (cm) Callus: Yes N/A N/A Periwound Skin Texture: Excoriation: No Induration: No Crepitus: No Rash: No Scarring: No Maceration: No N/A N/A Periwound Skin Moisture: Dry/Scaly: No Atrophie Blanche: No N/A N/A Periwound Skin Color: Cyanosis: No Ecchymosis: No Erythema: No Hemosiderin Staining: No Mottled: No Pallor: No Rubor: No No Abnormality N/A N/A Temperature: Debridement N/A N/A Procedures Performed: Treatment Notes Wound #3 (Amputation Site - Toe) Wound Laterality: Right Cleanser Vashe Discharge Instruction: use to cleanse wound daily. Peri-Wound Care Topical Primary Dressing Hydrofera Blue Ready Transfer Foam, 2.5x2.5 (in/in) Discharge Instruction: Apply directly to wound bed as directed BALINDA, HEACOCK [name] ([id number]) [name] Page 4 of 8 Discharge Instruction: Apply in office today and use once it arrives at home instead of medihoney. Secondary Dressing Woven Gauze Sponge, Non-Sterile 4x4 in Discharge Instruction: Apply over primary dressing as directed. Secured With Conforming Stretch Gauze Bandage, Sterile 2x75 (in/in) Discharge Instruction: Secure with stretch gauze as directed. 25M Medipore H Soft Cloth Surgical T ape, 4 x 10 (in/yd) Discharge Instruction: Secure with tape as directed. Compression Wrap Compression Stockings Add-Ons Electronic Signature(s) Signed: 08/02/2022 10:29:51 AM By: Geralyn Corwin DO Entered By: Geralyn Corwin on 08/02/2022 09:09:08 -------------------------------------------------------------------------------- Multi-Disciplinary Care Plan Details Patient Name: Date of Service: Donna [name]. 08/02/2022 8:00 A M Medical Record Number: [id number] Patient Account Number: [id number] Date of Birth/Sex: Treating [name]: 12-20-1965 (56 y.o. Donna [name] Primary Care Cherisse Carrell: Hillard Danker Other Clinician: Referring Talyn Dessert: Treating Noah Pelaez/Extender: Leandrew Koyanagi in Treatment: 4 Active Inactive Necrotic Tissue Nursing Diagnoses: Impaired tissue integrity related to necrotic/devitalized tissue Goals: Necrotic/devitalized tissue will be minimized in the wound bed Date Initiated: 06/29/2022 Target Resolution Date: 08/17/2022 Goal Status: Active Patient/caregiver will verbalize understanding of reason and process for debridement of necrotic tissue Date Initiated: 06/29/2022 Target Resolution Date: 08/17/2022 Goal Status: Active Interventions: Assess patient pain level pre-, during and post procedure and prior to discharge Provide education on necrotic tissue and debridement process Treatment Activities: Apply topical anesthetic as ordered : 06/29/2022 Notes: Pain, Acute or Chronic Nursing Diagnoses: Potential alteration in comfort, pain Goals: Patient will verbalize adequate pain control and receive pain control interventions during procedures as needed Date Initiated: 06/29/2022 Target Resolution Date: 08/17/2022 Goal Status: Active FELESIA, STAHLECKER ([id number]) [phone].pdf Page 5 of 8 Interventions: Complete pain assessment as per visit requirements Encourage patient to take pain medications as prescribed Provide education on pain management Treatment  Activities: Administer pain control measures as ordered : 06/29/2022 Notes: Wound/Skin Impairment Nursing Diagnoses: Knowledge deficit related to ulceration/compromised skin integrity Goals: Patient/caregiver will verbalize understanding of skin care regimen Date Initiated: 06/29/2022 Target Resolution Date: 08/17/2022 Goal Status: Active Interventions: Assess patient/caregiver ability to perform ulcer/skin care regimen upon admission and as needed Assess ulceration(s) every  visit Provide education on ulcer and skin care Treatment Activities: Skin care regimen initiated : 06/29/2022 Topical wound management initiated : 06/29/2022 Notes: Electronic Signature(s) Signed: 08/02/2022 5:16:09 PM By: Donna MuBreedlove, [name] [name] Entered By: Donna MuBreedlove, [name] on 08/02/2022 08:52:03 -------------------------------------------------------------------------------- Pain Assessment Details Patient Name: Date of Service: Donna [name], Donna [name]. 08/02/2022 8:00 A M Medical Record Number: [id number] Patient Account Number: [id number] Date of Birth/Sex: Treating [name]: 03/04/1966 (56 y.o. Donna RowanF) Breedlove, [name] Primary Care Skyley Grandmaison: Hillard Dankerrawford, Elizabeth Other Clinician: Referring Rox Mcgriff: Treating Darriana Deboy/Extender: Leandrew KoyanagiHoffman, Jessica Crawford, Elizabeth Weeks in Treatment: 4 Active Problems Location of Pain Severity and Description of Pain Patient Has Paino No Site Locations Pain Management and Medication Donna [name], Donna [name] ([id number]) ([id number] Page 6 of 8 Current Pain Management: Electronic Signature(s) Signed: 08/02/2022 5:16:09 PM By: Donna MuBreedlove, [name] [name] Entered By: Donna MuBreedlove, [name] on 08/02/2022 08:13:35 -------------------------------------------------------------------------------- Patient/Caregiver Education Details Patient Name: Date of Service: Donna [name], Donna [name]. 12/14/2023andnbsp8:00 A M Medical Record Number: [id number] Patient Account Number: [id number] Date of Birth/Gender: Treating [name]: 05/09/1966 (56 y.o. Donna RowanF) Breedlove, [name] Primary Care Physician: Hillard Dankerrawford, Elizabeth Other Clinician: Referring Physician: Treating Physician/Extender: Leandrew KoyanagiHoffman, Jessica Crawford, Elizabeth Weeks in Treatment: 4 Education Assessment Education Provided To: Patient Education Topics Provided Pain: Methods: Explain/Verbal Responses: Reinforcements needed, State content correctly Wound Debridement: Methods: Explain/Verbal Responses: Reinforcements needed,  State content correctly Wound/Skin Impairment: Methods: Explain/Verbal Responses: Reinforcements needed, State content correctly Electronic Signature(s) Signed: 08/02/2022 5:16:09 PM By: Donna MuBreedlove, [name] [name] Entered By: Donna MuBreedlove, [name] on 08/02/2022 08:52:24 -------------------------------------------------------------------------------- Wound Assessment Details Patient Name: Date of Service: Donna [name], Donna [name]. 08/02/2022 8:00 A M Medical Record Number: [id number] Patient Account Number: [id number] Date of Birth/Sex: Treating [name]: 08/21/1965 (56 y.o. Donna RowanF) Breedlove, [name] Primary Care Shantavia Jha: Hillard Dankerrawford, Elizabeth Other Clinician: Referring Durga Saldarriaga: Treating Breia Ocampo/Extender: Leandrew KoyanagiHoffman, Jessica Crawford, Elizabeth Weeks in Treatment: 4 Wound Status Wound Number: 3 Primary Etiology: Diabetic Wound/Ulcer of the Lower Extremity Wound Location: Right Amputation Site - Toe Secondary Etiology: Open Surgical Wound Wounding Event: Surgical Injury Wound Status: Open Date Acquired: 05/21/2022 Comorbid History: Hypertension, Type II Diabetes, Neuropathy Weeks Of Treatment: 4 Clustered Wound: No Cleora FleetCURRIN, Donna [name] ([id number]) [id number] Page 7 of 8 Photos Wound Measurements Length: (cm) 1.1 Width: (cm) 0.3 Depth: (cm) 0.1 Area: (cm) 0.259 Volume: (cm) 0.026 % Reduction in Area: 94.7% % Reduction in Volume: 99.4% Epithelialization: None Wound Description Classification: Grade 4 Wound Margin: Distinct, outline attached Exudate Amount: Medium Exudate Type: Serosanguineous Exudate Color: red, brown Foul Odor After Cleansing: No Slough/Fibrino Yes Wound Bed Granulation Amount: Medium (34-66%) Exposed Structure Granulation Quality: Pink, Pale Fascia Exposed: No Necrotic Amount: Medium (34-66%) Fat Layer (Subcutaneous Tissue) Exposed: Yes Necrotic Quality: Adherent Slough Tendon Exposed: No Muscle Exposed: No Joint Exposed: No Bone Exposed:  No Periwound Skin Texture Texture Color No Abnormalities Noted: No No Abnormalities Noted: No Callus: Yes Atrophie Blanche: No Crepitus: No Cyanosis: No Excoriation: No Ecchymosis: No Induration: No Erythema: No Rash: No Hemosiderin Staining: No Scarring: No Mottled: No Pallor: No Moisture Rubor: No No Abnormalities Noted: No Dry / Scaly: No Temperature / Pain Maceration: No Temperature: No Abnormality Treatment Notes Wound #3 (Amputation Site - Toe) Wound Laterality: Right Cleanser Vashe Discharge Instruction: use to cleanse wound  daily. Peri-Wound Care Topical Primary Dressing Hydrofera Blue Ready Transfer Foam, 2.5x2.5 (in/in) Discharge Instruction: Apply directly to wound bed as directed Santyl Ointment Discharge Instruction: Apply in office today and use once it arrives at home instead of medihoney. Secondary Dressing Woven Gauze Sponge, Non-Sterile 4x4 in Discharge Instruction: Apply over primary dressing as directed. MIYO, AINA ([id number]) [name] Page 8 of 8 Secured With [employer], Sterile 2x75 (in/in) Discharge Instruction: Secure with stretch gauze as directed. 79M Medipore H Soft Cloth Surgical T ape, 4 x 10 (in/yd) Discharge Instruction: Secure with tape as directed. Compression Wrap Compression Stockings Add-Ons Electronic Signature(s) Signed: 08/02/2022 4:55:27 PM By: Shawn Stall [name], BSN Signed: 08/02/2022 5:16:09 PM By: Donna [name] Entered By: Shawn Stall on 08/02/2022 08:16:59 -------------------------------------------------------------------------------- Vitals Details Patient Name: Date of Service: Donna [name]. 08/02/2022 8:00 A M Medical Record Number: [id number] Patient Account Number: [id number] Date of Birth/Sex: Treating [name]: 07-26-1966 (56 y.o. Donna [name] Primary Care Emrie Gayle: Hillard Danker Other Clinician: Referring Aziyah Provencal: Treating  Cashton Hosley/Extender: Leandrew Koyanagi in Treatment: 4 Vital Signs Time Taken: 08:16 Respiratory Rate (breaths/min): 17 Reference Range: 80 - 120 mg / dl Electronic Signature(s) Signed: 08/02/2022 5:16:09 PM By: Donna [name] Entered By: Donna Mu on 08/02/2022 08:16:23

## 2022-08-09 ENCOUNTER — Encounter (HOSPITAL_BASED_OUTPATIENT_CLINIC_OR_DEPARTMENT_OTHER): Payer: 59 | Admitting: Internal Medicine

## 2022-08-09 DIAGNOSIS — Z833 Family history of diabetes mellitus: Secondary | ICD-10-CM | POA: Diagnosis not present

## 2022-08-09 DIAGNOSIS — E11621 Type 2 diabetes mellitus with foot ulcer: Secondary | ICD-10-CM

## 2022-08-09 DIAGNOSIS — S91301A Unspecified open wound, right foot, initial encounter: Secondary | ICD-10-CM | POA: Diagnosis not present

## 2022-08-09 DIAGNOSIS — E1152 Type 2 diabetes mellitus with diabetic peripheral angiopathy with gangrene: Secondary | ICD-10-CM | POA: Diagnosis not present

## 2022-08-09 DIAGNOSIS — E1140 Type 2 diabetes mellitus with diabetic neuropathy, unspecified: Secondary | ICD-10-CM | POA: Diagnosis not present

## 2022-08-09 DIAGNOSIS — L03115 Cellulitis of right lower limb: Secondary | ICD-10-CM | POA: Diagnosis not present

## 2022-08-09 DIAGNOSIS — I10 Essential (primary) hypertension: Secondary | ICD-10-CM | POA: Diagnosis not present

## 2022-08-16 ENCOUNTER — Encounter (HOSPITAL_BASED_OUTPATIENT_CLINIC_OR_DEPARTMENT_OTHER): Payer: 59 | Admitting: Internal Medicine

## 2022-08-16 DIAGNOSIS — S91301A Unspecified open wound, right foot, initial encounter: Secondary | ICD-10-CM

## 2022-08-16 DIAGNOSIS — E11621 Type 2 diabetes mellitus with foot ulcer: Secondary | ICD-10-CM

## 2022-08-16 DIAGNOSIS — I10 Essential (primary) hypertension: Secondary | ICD-10-CM | POA: Diagnosis not present

## 2022-08-16 DIAGNOSIS — Z833 Family history of diabetes mellitus: Secondary | ICD-10-CM | POA: Diagnosis not present

## 2022-08-16 DIAGNOSIS — E1140 Type 2 diabetes mellitus with diabetic neuropathy, unspecified: Secondary | ICD-10-CM | POA: Diagnosis not present

## 2022-08-16 DIAGNOSIS — E1152 Type 2 diabetes mellitus with diabetic peripheral angiopathy with gangrene: Secondary | ICD-10-CM | POA: Diagnosis not present

## 2022-08-16 DIAGNOSIS — L03115 Cellulitis of right lower limb: Secondary | ICD-10-CM | POA: Diagnosis not present

## 2022-08-16 NOTE — Progress Notes (Signed)
BREI, POCIASK ([id number]) [id number] Page 1 of 7 Visit Report for 08/16/2022 Arrival Information Details Patient Name: Date of Service: Donna [name], Donna [name] 08/16/2022 8:45 A M Medical Record Number: [id number] Patient Account Number: [id number] Date of Birth/Sex: Treating RN: April 22, 1966 (56 y.o. F) Primary Care Auriella Wieand: Hillard Danker Other Clinician: Referring Darnell Stimson: Treating Avagrace Botelho/Extender: Leandrew Koyanagi in Treatment: 6 Visit Information History Since Last Visit Added or deleted any medications: No Patient Arrived: Ambulatory Any new allergies or adverse reactions: No Arrival Time: 09:02 Had a fall or experienced change in No Accompanied By: self activities of daily living that may affect Transfer Assistance: None risk of falls: Patient Identification Verified: Yes Signs or symptoms of abuse/neglect since last visito No Secondary Verification Process Completed: Yes Hospitalized since last visit: No Patient Requires Transmission-Based Precautions: No Implantable device outside of the clinic excluding No Patient Has Alerts: Yes cellular tissue based products placed in the center Patient Alerts: ABI's: [name]:1.16 11/2021 since last visit: Has Dressing in Place as Prescribed: Yes Pain Present Now: No Electronic Signature(s) Signed: 08/16/2022 4:51:54 PM By: Thayer Dallas Entered By: Thayer Dallas on 08/16/2022 09:03:26 -------------------------------------------------------------------------------- Lower Extremity Assessment Details Patient Name: Date of Service: Donna [name], Donna [name] 08/16/2022 8:45 A M Medical Record Number: [id number] Patient Account Number: [id number] Date of Birth/Sex: Treating RN: 04-Oct-1965 (56 y.o. F) Primary Care Monette Omara: Hillard Danker Other Clinician: Referring Eian Vandervelden: Treating Mcdonald Reiling/Extender: Leandrew Koyanagi in Treatment: 6 Edema  Assessment Assessed: [Left: No] [Right: No] Edema: [Left: N] [Right: o] Calf Left: Right: Point of Measurement: 31 cm From Medial Instep 37 cm Ankle Left: Right: Point of Measurement: 11 cm From Medial Instep 22 cm Electronic Signature(s) Signed: 08/16/2022 4:51:54 PM By: Thayer Dallas Entered By: Thayer Dallas on 08/16/2022 09:10:34 Donna [name] ([id number]) [id number] Page 2 of 7 -------------------------------------------------------------------------------- Multi Wound Chart Details Patient Name: Date of Service: Donna [name], Donna [name] 08/16/2022 8:45 A M Medical Record Number: [id number] Patient Account Number: [id number] Date of Birth/Sex: Treating RN: February 15, 1966 (56 y.o. F) Primary Care Valente Fosberg: Hillard Danker Other Clinician: Referring Vinod Mikesell: Treating Maniyah Moller/Extender: Leandrew Koyanagi in Treatment: 6 Vital Signs Height(in): Pulse(bpm): 76 Weight(lbs): Blood Pressure(mmHg): 123/75 Body Mass Index(BMI): Temperature(F): 98.5 Respiratory Rate(breaths/min): 18 [3:Photos:] [N/A:N/A] Right Amputation Site - T oe N/A N/A Wound Location: Surgical Injury N/A N/A Wounding Event: Diabetic Wound/Ulcer of the Lower N/A N/A Primary Etiology: Extremity Open Surgical Wound N/A N/A Secondary Etiology: Hypertension, Type II Diabetes, N/A N/A Comorbid History: Neuropathy 05/21/2022 N/A N/A Date Acquired: 6 N/A N/A Weeks of Treatment: Open N/A N/A Wound Status: No N/A N/A Wound Recurrence: 0.1x0.1x0.1 N/A N/A Measurements L x W x D (cm) 0.008 N/A N/A A (cm) : rea 0.001 N/A N/A Volume (cm) : 99.80% N/A N/A % Reduction in A rea: 100.00% N/A N/A % Reduction in Volume: Grade 4 N/A N/A Classification: Medium N/A N/A Exudate A mount: Serosanguineous N/A N/A Exudate Type: red, brown N/A N/A Exudate Color: Distinct, outline attached N/A N/A Wound Margin: Large (67-100%) N/A N/A Granulation A  mount: Pink, Pale N/A N/A Granulation Quality: None Present (0%) N/A N/A Necrotic A mount: Fat Layer (Subcutaneous Tissue): Yes N/A N/A Exposed Structures: Fascia: No Tendon: No Muscle: No Joint: No Bone: No Large (67-100%) N/A N/A Epithelialization: Debridement - Selective/Open Wound N/A N/A Debridement: Pre-procedure Verification/Time Out 09:19 N/A N/A Taken: Lidocaine N/A N/A Pain Control: Skin/Epidermis N/A N/A Level: 0.25 N/A N/A Debridement A (sq cm): rea Curette N/A N/A Instrument: Minimum N/A N/A Bleeding:  Pressure N/A N/A Hemostasis A chieved: 0 N/A N/A Procedural Pain: 0 N/A N/A Post Procedural Pain: Procedure was tolerated well N/A N/A Debridement Treatment Response: 0.2x0.2x0.1 N/A N/A Post Debridement Measurements L x W x D (cm) 0.003 N/A N/A Post Debridement Volume: (cm) Callus: Yes N/A N/A Periwound Skin TextureMCKENZY, SALAZAR [name] ([id number]) [id number] Page 3 of 7 Excoriation: No Induration: No Crepitus: No Rash: No Scarring: No Maceration: No N/A N/A Periwound Skin Moisture: Dry/Scaly: No Atrophie Blanche: No N/A N/A Periwound Skin Color: Cyanosis: No Ecchymosis: No Erythema: No Hemosiderin Staining: No Mottled: No Pallor: No Rubor: No No Abnormality N/A N/A Temperature: Debridement N/A N/A Procedures Performed: Treatment Notes Electronic Signature(s) Signed: 08/16/2022 10:17:05 AM By: Geralyn Corwin DO Entered By: Geralyn Corwin on 08/16/2022 09:35:43 -------------------------------------------------------------------------------- Multi-Disciplinary Care Plan Details Patient Name: Date of Service: Donna [name]. 08/16/2022 8:45 A M Medical Record Number: [id number] Patient Account Number: [id number] Date of Birth/Sex: Treating RN: 1965/12/10 (56 y.o. Ardis Rowan, [name] Primary Care Lashauna Arpin: Hillard Danker Other Clinician: Referring Bianney Rockwood: Treating Chirstina Haan/Extender: Leandrew Koyanagi in Treatment: 6 Active Inactive Necrotic Tissue Nursing Diagnoses: Impaired tissue integrity related to necrotic/devitalized tissue Goals: Necrotic/devitalized tissue will be minimized in the wound bed Date Initiated: 06/29/2022 Target Resolution Date: 08/17/2022 Goal Status: Active Patient/caregiver will verbalize understanding of reason and process for debridement of necrotic tissue Date Initiated: 06/29/2022 Target Resolution Date: 08/17/2022 Goal Status: Active Interventions: Assess patient pain level pre-, during and post procedure and prior to discharge Provide education on necrotic tissue and debridement process Treatment Activities: Apply topical anesthetic as ordered : 06/29/2022 Notes: Pain, Acute or Chronic Nursing Diagnoses: Potential alteration in comfort, pain Goals: Patient will verbalize adequate pain control and receive pain control interventions during procedures as needed Date Initiated: 06/29/2022 Target Resolution Date: 08/17/2022 Goal Status: Active Interventions: PINKI, ROTTMAN ([id number]) [phone].pdf Page 4 of 7 Complete pain assessment as per visit requirements Encourage patient to take pain medications as prescribed Provide education on pain management Treatment Activities: Administer pain control measures as ordered : 06/29/2022 Notes: Wound/Skin Impairment Nursing Diagnoses: Knowledge deficit related to ulceration/compromised skin integrity Goals: Patient/caregiver will verbalize understanding of skin care regimen Date Initiated: 06/29/2022 Target Resolution Date: 08/17/2022 Goal Status: Active Interventions: Assess patient/caregiver ability to perform ulcer/skin care regimen upon admission and as needed Assess ulceration(s) every visit Provide education on ulcer and skin care Treatment Activities: Skin care regimen initiated : 06/29/2022 Topical wound management initiated :  06/29/2022 Notes: Electronic Signature(s) Signed: 08/16/2022 4:52:17 PM By: Fonnie Mu RN Entered By: Fonnie Mu on 08/16/2022 09:21:10 -------------------------------------------------------------------------------- Pain Assessment Details Patient Name: Date of Service: Donna [name] 08/16/2022 8:45 A M Medical Record Number: [id number] Patient Account Number: [id number] Date of Birth/Sex: Treating RN: 1966/03/05 (56 y.o. F) Primary Care Quandarius Nill: Hillard Danker Other Clinician: Referring Emillio Ngo: Treating Ceasar Decandia/Extender: Leandrew Koyanagi in Treatment: 6 Active Problems Location of Pain Severity and Description of Pain Patient Has Paino No Site Locations Pain Management and Medication Current Pain ManagementTANEESHA, EDGIN ([id number]) [id number] Page 5 of 7 Electronic Signature(s) Signed: 08/16/2022 4:51:54 PM By: Thayer Dallas Entered By: Thayer Dallas on 08/16/2022 09:05:38 -------------------------------------------------------------------------------- Patient/Caregiver Education Details Patient Name: Date of Service: Donna [name] 12/28/2023andnbsp8:45 A M Medical Record Number: [id number] Patient Account Number: [id number] Date of Birth/Gender: Treating RN: [birth year] (56 y.o. Ardis Rowan, [name] Primary Care Physician: Hillard Danker Other Clinician: Referring Physician: Treating Physician/Extender: Leandrew Koyanagi in Treatment: 6 Education Assessment Education Provided To:  Patient Education Topics Provided Wound/Skin Impairment: Methods: Explain/Verbal Responses: Reinforcements needed, State content correctly Electronic Signature(s) Signed: 08/16/2022 4:52:17 PM By: Fonnie Mu RN Entered By: Fonnie Mu on 08/16/2022 09:21:31 -------------------------------------------------------------------------------- Wound Assessment  Details Patient Name: Date of Service: Donna [name] 08/16/2022 8:45 A M Medical Record Number: [id number] Patient Account Number: [id number] Date of Birth/Sex: Treating RN: 29-Jul-1966 (56 y.o. F) Primary Care Marissia Blackham: Hillard Danker Other Clinician: Referring Edword Cu: Treating Izick Gasbarro/Extender: Leandrew Koyanagi in Treatment: 6 Wound Status Wound Number: 3 Primary Etiology: Diabetic Wound/Ulcer of the Lower Extremity Wound Location: Right Amputation Site - Toe Secondary Etiology: Open Surgical Wound Wounding Event: Surgical Injury Wound Status: Open Date Acquired: 05/21/2022 Comorbid History: Hypertension, Type II Diabetes, Neuropathy Weeks Of Treatment: 6 Clustered Wound: No Photos Donna [name], Donna [name] ([id number]) [phone].pdf Page 6 of 7 Wound Measurements Length: (cm) 0.1 Width: (cm) 0.1 Depth: (cm) 0.1 Area: (cm) 0.008 Volume: (cm) 0.001 % Reduction in Area: 99.8% % Reduction in Volume: 100% Epithelialization: Large (67-100%) Tunneling: No Undermining: No Wound Description Classification: Grade 4 Wound Margin: Distinct, outline attached Exudate Amount: Medium Exudate Type: Serosanguineous Exudate Color: red, brown Foul Odor After Cleansing: No Slough/Fibrino No Wound Bed Granulation Amount: Large (67-100%) Exposed Structure Granulation Quality: Pink, Pale Fascia Exposed: No Necrotic Amount: None Present (0%) Fat Layer (Subcutaneous Tissue) Exposed: Yes Tendon Exposed: No Muscle Exposed: No Joint Exposed: No Bone Exposed: No Periwound Skin Texture Texture Color No Abnormalities Noted: No No Abnormalities Noted: No Callus: Yes Atrophie Blanche: No Crepitus: No Cyanosis: No Excoriation: No Ecchymosis: No Induration: No Erythema: No Rash: No Hemosiderin Staining: No Scarring: No Mottled: No Pallor: No Moisture Rubor: No No Abnormalities Noted: No Dry / Scaly: No Temperature /  Pain Maceration: No Temperature: No Abnormality Electronic Signature(s) Signed: 08/16/2022 4:51:54 PM By: Thayer Dallas Entered By: Thayer Dallas on 08/16/2022 09:11:45 -------------------------------------------------------------------------------- Vitals Details Patient Name: Date of Service: Donna [name] 08/16/2022 8:45 A M Medical Record Number: [id number] Patient Account Number: [id number] Date of Birth/Sex: Treating RN: [birth year] (56 y.o. F) Primary Care Georgia Delsignore: Hillard Danker Other Clinician: Referring Shaney Deckman: Treating Kiahna Banghart/Extender: Leandrew Koyanagi in Treatment: 6 Vital Signs Donna [name], Donna [name] [name] ([id number]) [id number] Page 7 of 7 Time Taken: 09:03 Temperature (F): 98.5 Pulse (bpm): 76 Respiratory Rate (breaths/min): 18 Blood Pressure (mmHg): 123/75 Reference Range: 80 - 120 mg / dl Electronic Signature(s) Signed: 08/16/2022 4:51:54 PM By: Thayer Dallas Entered By: Thayer Dallas on 08/16/2022 09:05:30

## 2022-08-16 NOTE — Progress Notes (Signed)
Donna, [name] ([id number]) [id number] Page 1 of 10 Visit Report for 08/16/2022 Chief Complaint Document Details Patient Name: Date of Service: Donna [name], Donna [name] 08/16/2022 8:45 A M Medical Record Number: [id number] Patient Account Number: [id number] Date of Birth/Sex: Treating RN: [birth year] (56 y.o. F) Primary Care Provider: Pricilla Holm Other Clinician: Referring Provider: Treating Provider/Extender: Verlene Mayer in Treatment: 6 Information Obtained from: Patient Chief Complaint 06/29/2022; surgical wound to the right foot status post right fifth ray amputation Electronic Signature(s) Signed: 08/16/2022 10:17:05 AM By: Kalman Shan DO Entered By: Kalman Shan on 08/16/2022 09:35:50 -------------------------------------------------------------------------------- Debridement Details Patient Name: Date of Service: Donna [name] 08/16/2022 8:45 A M Medical Record Number: [id number] Patient Account Number: [id number] Date of Birth/Sex: Treating RN: 04-25-1966 (56 y.o. Donna [name], Donna [name] Primary Care Provider: Pricilla Holm Other Clinician: Referring Provider: Treating Provider/Extender: Verlene Mayer in Treatment: 6 Debridement Performed for Assessment: Wound #3 Right Amputation Site - Toe Performed By: Physician Kalman Shan, DO Debridement Type: Debridement Severity of Tissue Pre Debridement: Fat layer exposed Level of Consciousness (Pre-procedure): Awake and Alert Pre-procedure Verification/Time Out Yes - 09:19 Taken: Start Time: 09:19 Pain Control: Lidocaine T Area Debrided (L x W): otal 0.5 (cm) x 0.5 (cm) = 0.25 (cm) Tissue and other material debrided: Viable, Non-Viable, Skin: Dermis , Skin: Epidermis Level: Skin/Epidermis Debridement Description: Selective/Open Wound Instrument: Curette Bleeding: Minimum Hemostasis Achieved: Pressure End Time:  09:19 Procedural Pain: 0 Post Procedural Pain: 0 Response to Treatment: Procedure was tolerated well Level of Consciousness (Post- Awake and Alert procedure): Post Debridement Measurements of Total Wound Length: (cm) 0.2 Width: (cm) 0.2 Depth: (cm) 0.1 Volume: (cm) 0.003 Character of Wound/Ulcer Post Debridement: Improved Severity of Tissue Post Debridement: Fat layer exposed Donna [name], Donna [name] [name] ([id number] Page 2 of 10 Post Procedure Diagnosis Same as Pre-procedure Electronic Signature(s) Signed: 08/16/2022 10:17:05 AM By: Kalman Shan DO Signed: 08/16/2022 4:52:17 PM By: [name] RN Entered By: [name] on 08/16/2022 09:20:29 -------------------------------------------------------------------------------- HPI Details Patient Name: Date of Service: Donna [name] 08/16/2022 8:45 A M Medical Record Number: [id number] Patient Account Number: [id number] Date of Birth/Sex: Treating RN: Sep 04, 1965 (56 y.o. 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 She tells me that since at least last summer she has had a progressive callused area that has opened closed but is never really healed. She has a history of calluses on her feet. She is a diabetic with neuropathy. She was referred to Dr. Posey Pronto at [facility] who felt that this might be a large plantar wart she was treated with laser for 2 treatments and then subsequently with ammonium lactate none of this is apparently made any difference according to the patient. She has a very odd looking area with thick skin and callus and an almost crossed like area of open tissue. She also has erythema  above this which she says is chronic. The other interesting part of her recent history is recurrent cellulitis on the right lower leg although this is separated from the area we are looking at. She has had trips to see infectious disease has been on Keflex for as long as 20 days. This is presumed to be strep. She saw Dr. Gale Journey Past medical history includes type 2 diabetes, recurrent cellulitis of the  right leg, right Achilles tendon repair a year ago for spontaneous rupture ABI in our clinic was 1.09 on the right 2/17; patient admitted the clinic last week had a thick callused area on the right lateral heel she is a diabetic with neuropathy. She had thick callus and across like area of open wound. I aggressively debrided this area to remove most of the callus and gave her Hydrofera Blue and heel off loader, heel cup and she has been wearing this religiously trying to keep the pressure off the area. She comes in today with a cross-like area of injury has healed she has a new open area that I do not remember from last time. Still some dry flaking skin and callus but not nearly as much as last week at which time I had to use a scalpel to remove this. 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 She came in 2 weeks ago with a very thick hyperkeratotic callused area on her right lateral heel. There was a "crossed" shaped area of split tissue with an open wound at the bottom. This had previously been treated as a possible wart without improvement. I removed all of the callus when she first came in here. This is mostly epithelialized over still thick skin but nothing like it was. She has 1 small open area that has not healed she has been more religious about offloading the area 10/27/2020 upon evaluation today patient appears to be doing well with regard to her wound although she does still apparently appear to have something still open in the base of the wound. Fortunately there is no  evidence of infection at this time which is great news and in general I feel like she is doing decently well though she has a lot of callus buildup she is using the offloading shoe for her heel. At home she does not always wear this she tells me. Generally she is using flip-flops there. Of note her primary care provider did give her Keflex yesterday for cellulitis of this leg but has nothing to do with the heel location. 3/17; the patient's area actually looks a lot better. There is no open wound here. Much less callus. She was given a prescription for 40% urea cream I am not sure that really is required this week. She is going to require less friction on this area. I think she is going to need a different type of shoe going forward Readmission: 12-13-2028 upon evaluation today patient presents for reevaluation in the clinic she was last seen by Dr. Leanord Hawking on 11-03-2020 when she was discharged this was due to a crack on her heel. Currently she is actually having an issue with her great toe on the right foot. She tells me that this has been present currently for a couple of weeks since around November 22, 2021. She has had a lot of callus over this area in general but not an open wound until that time. Subsequently this has more recently opened and has Become a greater concern she is also noted some odor which is disconcerting to her. She tells me that her greatest concern is that "she does not lose this toe". Again I completely understand her concern in this regard I definitely think we need to be aggressive in treating this and try to get things better. She is very Adult nurse. Patient does have a history of diabetes mellitus type 2 with diabetic neuropathy. She also tells me that she does have  a history as well of hypertension which is pretty well controlled. She has not yet had an x-ray of the area and has not been on any antibiotics. Both are things I think we will need to do today. 12-20-2021 upon  evaluation today patient appears to be doing well with regard to her toe ulcer all things considered. She has been tolerating the dressing changes without complication. I do have her x-ray as well as her culture for review today the culture did so Staphylococcus aureus no MRSA noted which was great news susceptible to doxycycline which is what I did place her on last week. Overall the odor that she was noticing has also cleared which is great news. With regard to the x-ray there does not appear to be any signs of osteomyelitis or bone erosion at this point which is also great news in general and very pleased with where things stand. 12-27-2021 upon evaluation today patient appears to be doing well with regard to her wound this is actually measuring significantly better which is great news. Overall I am extremely pleased with where we stand today. No fevers, chills, nausea, vomiting, or diarrhea. 01-03-2022 upon evaluation today patient's wound is actually showing signs of significant improvement which is also news. I am very pleased with what we are seeing there is a little bit of callus buildup still but we are going to work on that today otherwise she seems to be making excellent progress which is great news. Donna [name], Donna [name] ([id number]) [id number] Page 3 of 10 01-10-2022 upon evaluation today patient appears to be doing well currently in regard to her toe ulcer. She has been tolerating the dressing changes without complication. Fortunately there does not appear to be any evidence of active infection locally or systemically which is great news. No fevers, chills, nausea, vomiting, or diarrhea. 01-17-2022 upon evaluation today patient's toe was actually doing about the same this week there is not a tremendous improvement in size but overall the appearance of the wound is better it is much flatter I do think the [facility] has done well in that regard. We will still wait  to hear back on the skin substitute. With that being said I do believe that the patient could benefit from again the skin substitute to try to help get this to heal much more effectively and quickly in the meantime I am trying to help flatten this out which I think is doing a pretty good job.. 6/7; wound on the plantar right great toe over the distal phalanx. Wound is slightly smaller but looks healthier there is rims of epithelialization she still has some callus on the toe indicative of inadequate pressure/friction relief. I talked to her about making sure in the forefoot offloading boot that the weight stays on her heel in stance phase 01-31-2022 upon evaluation patient's wound is still continuing to show signs of really minimal progress unfortunately. I actually believe that she may need to go into the total contact cast in order to get this to heal and I discussed that with her today as well. Obviously this has been a little bit of a barrier for her not because she does not want to do it and does not want to get healed but rather because it is going to prevent her from being able to drive which is obviously going to be a significant quality-of-life issue with what she does in the position she is in at work. 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 I do believe she could work with the cast on but the issue simply is that she cannot drive to get to work. Otherwise should be able to walk and do what she needs to do. 02-07-2022 upon evaluation today patient appears to be doing well with regard to her wounds. She has been tolerating the dressing changes without complication. Fortunately I do not see any signs of infection although the wound itself is not significantly smaller as of yet. This is something that we are trying to work on I do believe a total contact cast would be the right way to go. I discussed this with the patient today as  well. She is actually in agreement with giving this a trial although she is a little nervous about it being that she is claustrophobic. 6/23; patient presents for obligatory cast exchange. She reports that her heel feels sore from the total contact cast. On that she had no issues. 02-14-2022 upon evaluation today patient's wound is actually showing signs of excellent improvement. There does not appear to be any signs of active infection at this time and overall the toe looks amazing in just 1 week's time. We have come down almost half compared to where we were before size wise and this is also him as well. Overall I think that the casting has done extremely good for her and the toe in general looks significantly improved. I think this week since we want to have to mess with the wound bed itself just the callus around she will likely have even better improvement come next time. 02-21-2022 upon evaluation today patient appears to be doing well with regard to her toe. She is actually showing signs of improvement this is a little bit slower than she would like to see but nonetheless we are seeing significant changes in the right direction in my opinion. Fortunately she is also developing a lot less callus on the end of the toe which is helpful for her as well in my opinion. 02-28-2022 upon evaluation today patient appears to be doing well currently in regard to her wound. She has been tolerating the dressing changes. Fortunately I do not see any signs of infection I think the cast is doing an awesome job here. No fevers, chills, nausea, vomiting, or diarrhea. 03-07-2022 upon evaluation today patient appears to be doing well with regard to her wound although the collagen is very stuck and dry on the area on the tip of her toe at this point. Nonetheless I discussed with her that I am a little reluctant to put the cast on this week due to what appeared to been a blister along the left side of her toe laterally.  Nonetheless this has been a little worried about the possibility of infection and I do not see anything obvious I think that we may not want to put the cast on this week just to be on the safe side she voiced understanding. With that being said if were not to use the cast she is going to need to make sure to have still some pressure relief I think making gauze bolster with 3 gauze up underneath the toe is going to do a good job here. We will roll and tape this and then subsequently anchored underneath the big toe should be using the front offloading shoe as well. 7/26; patient I know from a previous stay in this clinic. Type II  diabetic with a wound on her right plantar great toe. She was in a total contact cast to 2 weeks ago the wound was close to closed last week according to our intake nurse. We put Xeroform and gauze on this that the patient has been changing her self. She has a forefoot offloading boot. 03-21-2022 upon a variable evaluation today patient appears to be doing somewhat poorly in regard to callus buildup at this time. Fortunately I do not see any evidence of overall worsening but this is just still open. No fevers, chills, nausea, vomiting, or diarrhea. I think we may have to go back into the cast next week if this is not significantly improved by that time. 03-28-2022 upon evaluation today patient's wound is actually showing signs of significant improvement which is great news. The cast over the past week he has done a great job. This is so small however but still has a little bit of a deeper area that I am concerned about the possibility of it drying out too much under the cast that is what we saw today and callused over. Subsequently I did remove the callus. 04-11-2022 upon evaluation today patient appears to still have a lot of callus buildup today. She was sick with COVID last weeks has been 2 weeks since have seen her. This is what accounts for some of the callus buildup in my  opinion. With that being said she tells me she was not nearly as active working from home with COVID as she can go in. Nonetheless I do believe still that based on what we are seeing this is still too much friction is likely occurring around the toe area. 04-18-2022 upon evaluation today patient appears to be doing excellent in regard to her wound in fact this appears to be completely healed which is great news. Fortunately I do not see any evidence of active infection locally or systemically at this time which is excellent as well. No fevers, chills, nausea, vomiting, or diarrhea. 06/29/2022 Ms. Franz Dell is a 56 year old female with a past medical history of uncontrolled type 2 diabetes that presents to the clinic for a wound to the right foot. On 06/02/2022 she presented to the hospital for cellulitis of the right foot and found to have osteomyelitis of the fifth toe. She had a 5th ray amputation on 10/14. Her culture grew Staph aureus. She was placed on 10 days of linezolid by infectious disease. She has been doing a dry dressing for the past 3-4 weeks. She currently denies systemic signs of infection. 11/17; patient presents for follow-up. She has been using Santyl and silver alginate to the wound bed daily. She reports using Vashe wound cleanser between dressing changes. She is taking doxycycline currently. She has no issues or complaints today. 11/28; patient presents for follow-up. She has been using Santyl and silver alginate to the wound bed without issues. She completed her course of doxycycline. She denies signs of infection. 12/5; patient presents for follow-up. She has been using Santyl and silver alginate to the wound bed. She has no issues or complaints today. She denies signs of infection. 12/14; patient presents for follow-up. She has been using Santyl and Hydrofera Blue to the wound bed. There is been improvement in wound healing. She denies signs of infection. 12/21;  patient presents for follow-up. She has been using Santyl and Hydrofera Blue to the wound bed. There continues to be improvement in wound healing. She has no issues or complaints today. 12/20; patient  presents for follow-up. She has been using Hydrofera Blue to the wound bed. She had devitalized tissue on exam and after removal the area appears almost epithelialized. She has no issues or complaints today. Donna [name], Donna [name] ([id number]) [id number] Page 4 of 10 Electronic Signature(s) Signed: 08/16/2022 10:17:05 AM By: Kalman Shan DO Entered By: Kalman Shan on 08/16/2022 09:36:21 -------------------------------------------------------------------------------- Physical Exam Details Patient Name: Date of Service: Donna [name], Donna [name] 08/16/2022 8:45 A M Medical Record Number: [id number] Patient Account Number: [id number] Date of Birth/Sex: Treating RN: 19-Jan-1966 (56 y.o. F) Primary Care Provider: Pricilla Holm Other Clinician: Referring Provider: Treating Provider/Extender: Verlene Mayer in Treatment: 6 Constitutional respirations regular, non-labored and within target range for patient.. Cardiovascular 2+ dorsalis pedis/posterior tibialis pulses. Psychiatric pleasant and cooperative. Notes Right foot: T the lateral/dorsal aspect there is an open wound with devitalized tissue. Post debridement two small areas with granulation tissue. No signs of o infection including increased warmth, erythema or purulent drainage. Electronic Signature(s) Signed: 08/16/2022 10:17:05 AM By: Kalman Shan DO Entered By: Kalman Shan on 08/16/2022 09:44:48 -------------------------------------------------------------------------------- Physician Orders Details Patient Name: Date of Service: Donna [name] 08/16/2022 8:45 A M Medical Record Number: [id number] Patient Account Number: [id number] Date of Birth/Sex: Treating  RN: [birth year] (56 y.o. Donna [name], Donna [name] Primary Care Provider: Pricilla Holm Other Clinician: Referring Provider: Treating Provider/Extender: Verlene Mayer in Treatment: 6 Verbal / Phone Orders: No Diagnosis Coding Follow-up Appointments ppointment in 1 week. - Dr. Heber Harrison Return A Anesthetic (In clinic) Topical Lidocaine 4% applied to wound bed Edema Control - Lymphedema / SCD / Other Avoid standing for long periods of time. Off-Loading Open toe surgical shoe to: - office to provide a new surgical shoe. ensure no pressure to right lateral foot. Wound Treatment Wound #3 - Amputation Site - Toe Wound Laterality: Right Cleanser: Vashe 1 x Per Day/30 Days Discharge Instructions: use to cleanse wound daily. BRICEIDY, PASTOREK ([id number]) [id number] Page 5 of 10 Prim Dressing: Hydrofera Blue Ready Transfer Foam, 2.5x2.5 (in/in) 1 x Per Day/30 Days ary Discharge Instructions: Apply directly to wound bed as directed Secondary Dressing: Woven Gauze Sponge, Non-Sterile 4x4 in (Generic) 1 x Per Day/30 Days Discharge Instructions: Apply over primary dressing as directed. Secured With: Child psychotherapist, Sterile 2x75 (in/in) (Generic) 1 x Per Day/30 Days Discharge Instructions: Secure with stretch gauze as directed. Secured With: 64M Medipore H Soft Cloth Surgical T ape, 4 x 10 (in/yd) (Generic) 1 x Per Day/30 Days Discharge Instructions: Secure with tape as directed. Electronic Signature(s) Signed: 08/16/2022 10:17:05 AM By: Kalman Shan DO Entered By: Kalman Shan on 08/16/2022 09:44:58 -------------------------------------------------------------------------------- Problem List Details Patient Name: Date of Service: Donna [name] 08/16/2022 8:45 A M Medical Record Number: [id number] Patient Account Number: [id number] Date of Birth/Sex: Treating RN: [birth year] (56 y.o. F) Primary Care Provider:  Pricilla Holm Other Clinician: Referring Provider: Treating Provider/Extender: Verlene Mayer in Treatment: 6 Active Problems ICD-10 Encounter Code Description Active Date MDM Diagnosis S91.301A Unspecified open wound, right foot, initial encounter 06/29/2022 No Yes E11.621 Type 2 diabetes mellitus with foot ulcer 06/29/2022 No Yes I96 Gangrene, not elsewhere classified 06/29/2022 No Yes M86.9 Osteomyelitis, unspecified 06/29/2022 No Yes Inactive Problems Resolved Problems Electronic Signature(s) Signed: 08/16/2022 10:17:05 AM By: Kalman Shan DO Entered By: Kalman Shan on 08/16/2022 09:35:36 Scharlene Corn [name] ([id number] Page 6 of 10 -------------------------------------------------------------------------------- Progress Note Details Patient Name: Date of Service: Donna [name], Donna [name] 08/16/2022 8:45 A M Medical Record Number: [id number]  Patient Account Number: [id number] Date of Birth/Sex: Treating RN: 10-05-1965 (56 y.o. 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 She tells me that since at least last summer she has had a progressive callused area that has opened closed but is never really healed. She has a history of calluses on her feet. She is a diabetic with neuropathy. She was referred to Dr. Posey Pronto at [facility] who felt that this might be a large plantar wart she was treated with laser for 2 treatments and then subsequently with ammonium lactate none of this is apparently  made any difference according to the patient. She has a very odd looking area with thick skin and callus and an almost crossed like area of open tissue. She also has erythema above this which she says is chronic. The other interesting part of her recent history is recurrent cellulitis on the right lower leg although this is separated from the area we are looking at. She has had trips to see infectious disease has been on Keflex for as long as 20 days. This is presumed to be strep. She saw Dr. Gale Journey Past medical history includes type 2 diabetes, recurrent cellulitis of the right leg, right Achilles tendon repair a year ago for spontaneous rupture ABI in our clinic was 1.09 on the right 2/17; patient admitted the clinic last week had a thick callused area on the right lateral heel she is a diabetic with neuropathy. She had thick callus and across like area of open wound. I aggressively debrided this area to remove most of the callus and gave her Hydrofera Blue and heel off loader, heel cup and she has been wearing this religiously trying to keep the pressure off the area. She comes in today with a cross-like area of injury has healed she has a new open area that I do not remember from last time. Still some dry flaking skin and callus but not nearly as much as last week at which time I had to use a scalpel to remove this. 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 She came in 2 weeks ago with a very thick hyperkeratotic callused area on her right lateral heel. There was a "crossed" shaped area of split tissue with an open wound at the bottom. This had previously been treated as a possible wart without improvement. I removed all of the callus when she first came in here. This is mostly epithelialized over still thick skin but nothing like it was. She has 1 small open area that has not healed she has been more religious about offloading the area 10/27/2020 upon evaluation today patient  appears to be doing well with regard to her wound although she does still apparently appear to have something still open in the base of the wound. Fortunately there is no evidence of infection at this time which is great news and in general I feel like she is doing decently well though she has a lot of callus buildup she is using the offloading shoe for her heel. At home she does not always wear this she tells me. Generally she is using flip-flops there. Of note her  primary care provider did give her Keflex yesterday for cellulitis of this leg but has nothing to do with the heel location. 3/17; the patient's area actually looks a lot better. There is no open wound here. Much less callus. She was given a prescription for 40% urea cream I am not sure that really is required this week. She is going to require less friction on this area. I think she is going to need a different type of shoe going forward Readmission: 12-13-2028 upon evaluation today patient presents for reevaluation in the clinic she was last seen by Dr. Leanord Hawking on 11-03-2020 when she was discharged this was due to a crack on her heel. Currently she is actually having an issue with her great toe on the right foot. She tells me that this has been present currently for a couple of weeks since around November 22, 2021. She has had a lot of callus over this area in general but not an open wound until that time. Subsequently this has more recently opened and has Become a greater concern she is also noted some odor which is disconcerting to her. She tells me that her greatest concern is that "she does not lose this toe". Again I completely understand her concern in this regard I definitely think we need to be aggressive in treating this and try to get things better. She is very Adult nurse. Patient does have a history of diabetes mellitus type 2 with diabetic neuropathy. She also tells me that she does have a history as well of hypertension which is  pretty well controlled. She has not yet had an x-ray of the area and has not been on any antibiotics. Both are things I think we will need to do today. 12-20-2021 upon evaluation today patient appears to be doing well with regard to her toe ulcer all things considered. She has been tolerating the dressing changes without complication. I do have her x-ray as well as her culture for review today the culture did so Staphylococcus aureus no MRSA noted which was great news susceptible to doxycycline which is what I did place her on last week. Overall the odor that she was noticing has also cleared which is great news. With regard to the x-ray there does not appear to be any signs of osteomyelitis or bone erosion at this point which is also great news in general and very pleased with where things stand. 12-27-2021 upon evaluation today patient appears to be doing well with regard to her wound this is actually measuring significantly better which is great news. Overall I am extremely pleased with where we stand today. No fevers, chills, nausea, vomiting, or diarrhea. 01-03-2022 upon evaluation today patient's wound is actually showing signs of significant improvement which is also news. I am very pleased with what we are seeing there is a little bit of callus buildup still but we are going to work on that today otherwise she seems to be making excellent progress which is great news. 01-10-2022 upon evaluation today patient appears to be doing well currently in regard to her toe ulcer. She has been tolerating the dressing changes without complication. Fortunately there does not appear to be any evidence of active infection locally or systemically which is great news. No fevers, chills, nausea, vomiting, or diarrhea. 01-17-2022 upon evaluation today patient's toe was actually doing about the same this week there is not a tremendous improvement in size but overall the appearance of the wound is better it is much  flatter I do think the Hydrofera Blue has done well in that regard. We will still wait to hear back on the skin substitute. With that being said I do believe that the patient could benefit from again the skin substitute to try to help get this to heal much more effectively and quickly in the meantime I am trying to help flatten this out which I think is doing a pretty good job.. 6/7; wound on the plantar right great toe over the distal phalanx. Wound is slightly smaller but looks healthier there is rims of epithelialization she still has some callus on the toe indicative of inadequate pressure/friction relief. I talked to her about making sure in the forefoot offloading boot that the weight stays on her heel in stance phase 01-31-2022 upon evaluation patient's wound is still continuing to show signs of really minimal progress unfortunately. I actually believe that she may need to go into the total contact cast in order to get this to heal and I discussed that with her today as well. Obviously this has been a little bit of a barrier for her not because she does not want to do it and does not want to get healed but rather because it is going to prevent her from being able to drive which is obviously going to be a significant quality-of-life issue with what she does in the position she is in at work. 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 I do believe she could work with the cast on but the issue simply is that she cannot drive to get to work. Otherwise should be able to walk and do what she needs to do. 02-07-2022 upon evaluation today patient appears to be doing well with regard to her wounds. She has been tolerating the dressing changes without complication. Fortunately I do not see any signs of infection although the wound itself is not significantly  smaller as of yet. This is something that we are trying to work on I do believe a total contact cast would be the right way to go. I discussed this with the patient today as well. She is actually in agreement with giving this a trial although she is a little nervous about it being that she is claustrophobic. 6/23; patient presents for obligatory cast exchange. She reports that her heel feels sore from the total contact cast. On that she had no issues. 02-14-2022 upon evaluation today patient's wound is actually showing signs of excellent improvement. There does not appear to be any signs of active infection at this time and overall the toe looks amazing in just 1 week's time. We have come down almost half compared to where we were before size wise and this is also him as well. Overall I think that the casting has done extremely good for her and the toe in general looks significantly improved. I think this week since we want to have to mess with the wound bed itself just the callus around she will likely have even better improvement come next time. 02-21-2022 upon evaluation today patient appears to be doing well with regard to her toe. She is actually showing signs of improvement this is a little bit slower than she would like to see but nonetheless we are seeing significant changes in the right direction in my opinion. Fortunately she is also developing a lot less callus  on the end of the toe which is helpful for her as well in my opinion. 02-28-2022 upon evaluation today patient appears to be doing well currently in regard to her wound. She has been tolerating the dressing changes. Fortunately I do not see any signs of infection I think the cast is doing an awesome job here. No fevers, chills, nausea, vomiting, or diarrhea. 03-07-2022 upon evaluation today patient appears to be doing well with regard to her wound although the collagen is very stuck and dry on the area on the tip of her toe at this point.  Nonetheless I discussed with her that I am a little reluctant to put the cast on this week due to what appeared to been a blister along the left side of her toe laterally. Nonetheless this has been a little worried about the possibility of infection and I do not see anything obvious I think that we may not want to put the cast on this week just to be on the safe side she voiced understanding. With that being said if were not to use the cast she is going to need to make sure to have still some pressure relief I think making gauze bolster with 3 gauze up underneath the toe is going to do a good job here. We will roll and tape this and then subsequently anchored underneath the big toe should be using the front offloading shoe as well. 7/26; patient I know from a previous stay in this clinic. Type II diabetic with a wound on her right plantar great toe. She was in a total contact cast to 2 weeks ago the wound was close to closed last week according to our intake nurse. We put Xeroform and gauze on this that the patient has been changing her self. She has a forefoot offloading boot. 03-21-2022 upon a variable evaluation today patient appears to be doing somewhat poorly in regard to callus buildup at this time. Fortunately I do not see any evidence of overall worsening but this is just still open. No fevers, chills, nausea, vomiting, or diarrhea. I think we may have to go back into the cast next week if this is not significantly improved by that time. 03-28-2022 upon evaluation today patient's wound is actually showing signs of significant improvement which is great news. The cast over the past week he has done a great job. This is so small however but still has a little bit of a deeper area that I am concerned about the possibility of it drying out too much under the cast that is what we saw today and callused over. Subsequently I did remove the callus. 04-11-2022 upon evaluation today patient appears to still  have a lot of callus buildup today. She was sick with COVID last weeks has been 2 weeks since have seen her. This is what accounts for some of the callus buildup in my opinion. With that being said she tells me she was not nearly as active working from home with COVID as she can go in. Nonetheless I do believe still that based on what we are seeing this is still too much friction is likely occurring around the toe area. 04-18-2022 upon evaluation today patient appears to be doing excellent in regard to her wound in fact this appears to be completely healed which is great news. Fortunately I do not see any evidence of active infection locally or systemically at this time which is excellent as well. No fevers, chills, nausea, vomiting,  or diarrhea. 06/29/2022 Ms. Franz Dell is a 56 year old female with a past medical history of uncontrolled type 2 diabetes that presents to the clinic for a wound to the right foot. On 06/02/2022 she presented to the hospital for cellulitis of the right foot and found to have osteomyelitis of the fifth toe. She had a 5th ray amputation on 10/14. Her culture grew Staph aureus. She was placed on 10 days of linezolid by infectious disease. She has been doing a dry dressing for the past 3-4 weeks. She currently denies systemic signs of infection. 11/17; patient presents for follow-up. She has been using Santyl and silver alginate to the wound bed daily. She reports using Vashe wound cleanser between dressing changes. She is taking doxycycline currently. She has no issues or complaints today. 11/28; patient presents for follow-up. She has been using Santyl and silver alginate to the wound bed without issues. She completed her course of doxycycline. She denies signs of infection. 12/5; patient presents for follow-up. She has been using Santyl and silver alginate to the wound bed. She has no issues or complaints today. She denies signs of infection. 12/14; patient presents  for follow-up. She has been using Santyl and Hydrofera Blue to the wound bed. There is been improvement in wound healing. She denies signs of infection. 12/21; patient presents for follow-up. She has been using Santyl and Hydrofera Blue to the wound bed. There continues to be improvement in wound healing. She has no issues or complaints today. 12/20; patient presents for follow-up. She has been using Hydrofera Blue to the wound bed. She had devitalized tissue on exam and after removal the area appears almost epithelialized. She has no issues or complaints today. Patient History Information obtained from Patient. Family History Cancer - Father, Diabetes - Mother, Heart Disease - Father,Mother. Social History Never smoker, Marital Status - Single, Alcohol Use - Never, Drug Use - No History, Caffeine Use - Daily. 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 Medical A Surgical History Notes nd Cardiovascular Hyperlipidemia Objective Constitutional respirations regular, non-labored and within target range for patient.. Vitals Time Taken: 9:03 AM, Temperature: 98.5 F, Pulse: 76 bpm, Respiratory Rate: 18 breaths/min, Blood Pressure: 123/75 mmHg. Cardiovascular 2+ dorsalis pedis/posterior tibialis pulses. Psychiatric pleasant and cooperative. General Notes: Right foot: T the lateral/dorsal aspect there is an open wound with devitalized tissue. Post debridement two small areas with granulation tissue. o No signs of infection including increased warmth, erythema or purulent drainage. Integumentary (Hair, Skin) Wound #3 status is  Open. Original cause of wound was Surgical Injury. The date acquired was: 05/21/2022. The wound has been in treatment 6 weeks. The wound is located on the Right Amputation Site - T The wound measures 0.1cm length x 0.1cm width x 0.1cm depth; 0.008cm^2 area and 0.001cm^3 volume. oe. There is Fat Layer (Subcutaneous Tissue) exposed. There is no tunneling or undermining noted. There is a medium amount of serosanguineous drainage noted. The wound margin is distinct with the outline attached to the wound base. There is large (67-100%) pink, pale granulation within the wound bed. There is no necrotic tissue within the wound bed. The periwound skin appearance exhibited: Callus. The periwound skin appearance did not exhibit: Crepitus, Excoriation, Induration, Rash, Scarring, Dry/Scaly,  Maceration, Atrophie Blanche, Cyanosis, Ecchymosis, Hemosiderin Staining, Mottled, Pallor, Rubor, Erythema. Periwound temperature was noted as No Abnormality. Assessment Active Problems ICD-10 Unspecified open wound, right foot, initial encounter Type 2 diabetes mellitus with foot ulcer Gangrene, not elsewhere classified Osteomyelitis, unspecified Patient's wound is well-healing. I debrided nonviable tissue. I recommended continuing the course with Hydrofera Blue and offloading. Follow-up in 1 week and I am hopeful the wound will be healed by then. Procedures Wound #3 Pre-procedure diagnosis of Wound #3 is a Diabetic Wound/Ulcer of the Lower Extremity located on the Right Amputation Site - T .Severity of Tissue Pre oe Debridement is: Fat layer exposed. There was a Selective/Open Wound Skin/Epidermis Debridement with a total area of 0.25 sq cm performed by Kalman Shan, DO. With the following instrument(s): Curette to remove Viable and Non-Viable tissue/material. Material removed includes Skin: Dermis and Skin: Epidermis and after achieving pain control using Lidocaine. No specimens were taken. A time out was  conducted at 09:19, prior to the start of the procedure. A Minimum amount of bleeding was controlled with Pressure. The procedure was tolerated well with a pain level of 0 throughout and a pain level of 0 following the procedure. Post Debridement Measurements: 0.2cm length x 0.2cm width x 0.1cm depth; 0.003cm^3 volume. Character of Wound/Ulcer Post Debridement is improved. Severity of Tissue Post Debridement is: Fat layer exposed. Post procedure Diagnosis Wound #3: Same as Pre-Procedure Plan Follow-up Appointments: Return Appointment in 1 week. - Dr. Heber Imperial Anesthetic: (In clinic) Topical Lidocaine 4% applied to wound bed Donna [name], Donna [name] ([id number]) [phone].pdf Page 9 of 10 Edema Control - Lymphedema / SCD / Other: Avoid standing for long periods of time. Off-Loading: Open toe surgical shoe to: - office to provide a new surgical shoe. ensure no pressure to right lateral foot. WOUND #3: - Amputation Site - T oe Wound Laterality: Right Cleanser: Vashe 1 x Per Day/30 Days Discharge Instructions: use to cleanse wound daily. Prim Dressing: Hydrofera Blue Ready Transfer Foam, 2.5x2.5 (in/in) 1 x Per Day/30 Days ary Discharge Instructions: Apply directly to wound bed as directed Secondary Dressing: Woven Gauze Sponge, Non-Sterile 4x4 in (Generic) 1 x Per Day/30 Days Discharge Instructions: Apply over primary dressing as directed. Secured With: Child psychotherapist, Sterile 2x75 (in/in) (Generic) 1 x Per Day/30 Days Discharge Instructions: Secure with stretch gauze as directed. Secured With: 65M Medipore H Soft Cloth Surgical T ape, 4 x 10 (in/yd) (Generic) 1 x Per Day/30 Days Discharge Instructions: Secure with tape as directed. 1. In office sharp debridement 2. Hydrofera Blue 3. Aggressive offloadingoosurgical shoe Electronic Signature(s) Signed: 08/16/2022 10:17:05 AM By: Kalman Shan DO Entered By: Kalman Shan on 08/16/2022  09:47:41 -------------------------------------------------------------------------------- HxROS Details Patient Name: Date of Service: Donna [name] 08/16/2022 8:45 A M Medical Record Number: [id number] Patient Account Number: [id number] Date of Birth/Sex: Treating RN: Oct 05, 1965 (56 y.o. F) Primary Care Provider: Pricilla Holm Other Clinician: Referring Provider: Treating Provider/Extender: Verlene Mayer in Treatment: 6 Information Obtained From Patient Cardiovascular Medical History: Positive for: Hypertension Past Medical History Notes: Hyperlipidemia Endocrine Medical History: Positive for: Type II Diabetes Treated with: Insulin, Oral agents Blood sugar tested every day: No Neurologic Medical History: Positive for: Neuropathy Immunizations Pneumococcal Vaccine: Received Pneumococcal Vaccination: No Implantable Devices None Hospitalization / Surgery History Type of Hospitalization/Surgery right exicision haglund's deformity wih achilles tendon repair 2020 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 Donna [name], Donna [name] ([id number]) [id number] Page 10 of 10 Family and Social History Cancer: Yes - Father;  Diabetes: Yes - Mother; Heart Disease: Yes - Father,Mother; Never smoker; Marital Status - Single; Alcohol Use: Never; Drug Use: No History; Caffeine Use: Daily; Financial Concerns: No; Food, Clothing or Shelter Needs: No; Support System Lacking: No; Transportation Concerns: No Electronic Signature(s) Signed: 08/16/2022 10:17:05 AM By: Kalman Shan DO Entered By: Kalman Shan on 08/16/2022 09:36:26 -------------------------------------------------------------------------------- SuperBill Details Patient Name: Date of Service: Donna [name] 08/16/2022 Medical Record Number: [id number] Patient Account Number: [id number] Date of Birth/Sex: Treating RN: 1966/05/15 (56 y.o.  F) Primary Care Provider: Pricilla Holm Other Clinician: Referring Provider: Treating Provider/Extender: Verlene Mayer in Treatment: 6 Diagnosis Coding ICD-10 Codes Code Description [id number] Unspecified open wound, right foot, initial encounter E11.621 Type 2 diabetes mellitus with foot ulcer I96 Gangrene, not elsewhere classified M86.9 Osteomyelitis, unspecified Facility Procedures : CPT4 Code: [id number] Description: [id number] - DEBRIDE WOUND 1ST 20 SQ CM OR < ICD-10 Diagnosis Description S91.301A Unspecified open wound, right foot, initial encounter E11.621 Type 2 diabetes mellitus with foot ulcer Modifier: Quantity: 1 Physician Procedures : CPT4 Code Description Modifier [id number] - WC PHYS DEBR WO ANESTH 20 SQ CM ICD-10 Diagnosis Description S91.301A Unspecified open wound, right foot, initial encounter E11.621 Type 2 diabetes mellitus with foot ulcer Quantity: 1 Electronic Signature(s) Signed: 08/16/2022 10:17:05 AM By: Kalman Shan DO Entered By: Kalman Shan on 08/16/2022 09:47:51

## 2022-08-23 ENCOUNTER — Encounter (HOSPITAL_BASED_OUTPATIENT_CLINIC_OR_DEPARTMENT_OTHER): Payer: Commercial Managed Care - PPO | Attending: Internal Medicine | Admitting: Internal Medicine

## 2022-08-23 DIAGNOSIS — Z8631 Personal history of diabetic foot ulcer: Secondary | ICD-10-CM | POA: Diagnosis not present

## 2022-08-23 DIAGNOSIS — E11621 Type 2 diabetes mellitus with foot ulcer: Secondary | ICD-10-CM

## 2022-08-23 DIAGNOSIS — M869 Osteomyelitis, unspecified: Secondary | ICD-10-CM

## 2022-08-23 DIAGNOSIS — E1140 Type 2 diabetes mellitus with diabetic neuropathy, unspecified: Secondary | ICD-10-CM | POA: Insufficient documentation

## 2022-08-23 DIAGNOSIS — I96 Gangrene, not elsewhere classified: Secondary | ICD-10-CM | POA: Diagnosis not present

## 2022-08-23 DIAGNOSIS — Z09 Encounter for follow-up examination after completed treatment for conditions other than malignant neoplasm: Secondary | ICD-10-CM | POA: Diagnosis not present

## 2022-08-23 DIAGNOSIS — S91301A Unspecified open wound, right foot, initial encounter: Secondary | ICD-10-CM | POA: Diagnosis not present

## 2022-08-23 NOTE — Progress Notes (Signed)
Donna [name], Donna [name] ([id number]) [id number] Page 1 of 9 Visit Report for 08/23/2022 Chief Complaint Document Details Patient Name: Date of Service: Donna [name], Donna [name]. 08/23/2022 8:00 A M Medical Record Number: [id number] Patient Account Number: [id number] Date of Birth/Sex: Treating RN: 06/17/1966 (57 y.o. F) Primary Care Provider: Hillard Dankerrawford, Elizabeth Other Clinician: Referring Provider: Treating Provider/Extender: Leandrew KoyanagiHoffman, Khaya Theissen Crawford, Elizabeth Weeks in Treatment: 7 Information Obtained from: Patient Chief Complaint 06/29/2022; surgical wound to the right foot status post right fifth ray amputation Electronic Signature(s) Signed: 08/23/2022 9:29:55 AM By: Geralyn CorwinHoffman, Osmara Drummonds DO Entered By: Geralyn CorwinHoffman, Dontae Minerva on 08/23/2022 09:27:33 -------------------------------------------------------------------------------- HPI Details Patient Name: Date of Service: Donna [name], Donna [name]. 08/23/2022 8:00 A M Medical Record Number: [id number] Patient Account Number: [id number] Date of Birth/Sex: Treating RN: 10/09/1965 (57 y.o. 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 She tells me that since at least last summer she has had a progressive callused area that has opened closed but is never really healed. She has a history of calluses on her feet. She is a diabetic with neuropathy. She was referred to Dr. Allena KatzPatel at [facility] who felt that this might be a large plantar wart she was treated with laser for 2 treatments and then subsequently with ammonium lactate none of this is apparently made any difference according to the patient. She has a very odd looking area with thick skin and callus and an almost  crossed like area of open tissue. She also has erythema above this which she says is chronic. The other interesting part of her recent history is recurrent cellulitis on the right lower leg although this is separated from the area we are looking at. She has had trips to see infectious disease has been on Keflex for as long as 20 days. This is presumed to be strep. She saw Dr. Renold DonVu Past medical history includes type 2 diabetes, recurrent cellulitis of the right leg, right Achilles tendon repair a year ago for spontaneous rupture ABI in our clinic was 1.09 on the right 2/17; patient admitted the clinic last week had a thick callused area on the right lateral heel she is a diabetic with neuropathy. She had thick callus and across like area of open wound. I aggressively debrided this area to remove most of the callus and gave her Hydrofera Blue and heel off loader, heel cup and she has been wearing this religiously trying to keep the pressure off the area. She comes in today with a cross-like area of injury has healed she has a new open area that I do not remember from last time. Still some dry flaking skin and callus but not nearly as much as last week at which time I had to use a scalpel to remove this. 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 She came in 2 weeks ago with a very thick hyperkeratotic callused area on her right lateral heel. There was a "crossed" shaped area of split tissue with an open wound at the bottom. This had previously been treated as a possible wart without improvement. I removed all of the callus when she first came in here. This is mostly epithelialized over still thick skin but nothing like it was. She has 1 small open area that has not healed she has been  more religious about offloading the area 10/27/2020 upon evaluation today patient appears to be doing well with regard to her wound although she does still apparently appear to have something  still open in the base of the wound. Fortunately there is no evidence of infection at this time which is great news and in general I feel like she is doing decently well though she has a lot of callus buildup she is using the offloading shoe for her heel. At home she does not always wear this she tells me. Generally she is using flip-flops there. Of note her primary care provider did give her Keflex yesterday for cellulitis of this leg but has nothing to do with the heel location. 3/17; the patient's area actually looks a lot better. There is no open wound here. Much less callus. She was given a prescription for 40% urea cream I am not sure that really is required this week. She is going to require less friction on this area. I think she is going to need a different type of shoe going forward Donna [name], Donna [name] ([id number]) [id number] Page 2 of 9 Readmission: 12-13-2028 upon evaluation today patient presents for reevaluation in the clinic she was last seen by Dr. Leanord Hawking on 11-03-2020 when she was discharged this was due to a crack on her heel. Currently she is actually having an issue with her great toe on the right foot. She tells me that this has been present currently for a couple of weeks since around November 22, 2021. She has had a lot of callus over this area in general but not an open wound until that time. Subsequently this has more recently opened and has Become a greater concern she is also noted some odor which is disconcerting to her. She tells me that her greatest concern is that "she does not lose this toe". Again I completely understand her concern in this regard I definitely think we need to be aggressive in treating this and try to get things better. She is very Adult nurse. Patient does have a history of diabetes mellitus type 2 with diabetic neuropathy. She also tells me that she does have a history as well of hypertension which is pretty well controlled. She  has not yet had an x-ray of the area and has not been on any antibiotics. Both are things I think we will need to do today. 12-20-2021 upon evaluation today patient appears to be doing well with regard to her toe ulcer all things considered. She has been tolerating the dressing changes without complication. I do have her x-ray as well as her culture for review today the culture did so Staphylococcus aureus no MRSA noted which was great news susceptible to doxycycline which is what I did place her on last week. Overall the odor that she was noticing has also cleared which is great news. With regard to the x-ray there does not appear to be any signs of osteomyelitis or bone erosion at this point which is also great news in general and very pleased with where things stand. 12-27-2021 upon evaluation today patient appears to be doing well with regard to her wound this is actually measuring significantly better which is great news. Overall I am extremely pleased with where we stand today. No fevers, chills, nausea, vomiting, or diarrhea. 01-03-2022 upon evaluation today patient's wound is actually showing signs of significant improvement which is also news. I am very pleased with what we are seeing there is a little bit of  callus buildup still but we are going to work on that today otherwise she seems to be making excellent progress which is great news. 01-10-2022 upon evaluation today patient appears to be doing well currently in regard to her toe ulcer. She has been tolerating the dressing changes without complication. Fortunately there does not appear to be any evidence of active infection locally or systemically which is great news. No fevers, chills, nausea, vomiting, or diarrhea. 01-17-2022 upon evaluation today patient's toe was actually doing about the same this week there is not a tremendous improvement in size but overall the appearance of the wound is better it is much flatter I do think the [facility] has done well in that regard. We will still wait to hear back on the skin substitute. With that being said I do believe that the patient could benefit from again the skin substitute to try to help get this to heal much more effectively and quickly in the meantime I am trying to help flatten this out which I think is doing a pretty good job.. 6/7; wound on the plantar right great toe over the distal phalanx. Wound is slightly smaller but looks healthier there is rims of epithelialization she still has some callus on the toe indicative of inadequate pressure/friction relief. I talked to her about making sure in the forefoot offloading boot that the weight stays on her heel in stance phase 01-31-2022 upon evaluation patient's wound is still continuing to show signs of really minimal progress unfortunately. I actually believe that she may need to go into the total contact cast in order to get this to heal and I discussed that with her today as well. Obviously this has been a little bit of a barrier for her not because she does not want to do it and does not want to get healed but rather because it is going to prevent her from being able to drive which is obviously going to be a significant quality-of-life issue with what she does in the position she is in at work. 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 I do believe she could work with the cast on but the issue simply is that she cannot drive to get to work. Otherwise should be able to walk and do what she needs to do. 02-07-2022 upon evaluation today patient appears to be doing well with regard to her wounds. She has been tolerating the dressing changes without complication. Fortunately I do not see any signs of infection although the wound itself is not significantly smaller as of yet. This is something that we are trying to work on I do believe a total contact cast would be the right  way to go. I discussed this with the patient today as well. She is actually in agreement with giving this a trial although she is a little nervous about it being that she is claustrophobic. 6/23; patient presents for obligatory cast exchange. She reports that her heel feels sore from the total contact cast. On that she had no issues. 02-14-2022 upon evaluation today patient's wound is actually showing signs of excellent improvement. There does not appear to be any signs of active infection at this time and overall the toe looks amazing in just 1 week's time. We have come down almost half compared to where we were before size wise and this is also him as well. Overall I think that the  casting has done extremely good for her and the toe in general looks significantly improved. I think this week since we want to have to mess with the wound bed itself just the callus around she will likely have even better improvement come next time. 02-21-2022 upon evaluation today patient appears to be doing well with regard to her toe. She is actually showing signs of improvement this is a little bit slower than she would like to see but nonetheless we are seeing significant changes in the right direction in my opinion. Fortunately she is also developing a lot less callus on the end of the toe which is helpful for her as well in my opinion. 02-28-2022 upon evaluation today patient appears to be doing well currently in regard to her wound. She has been tolerating the dressing changes. Fortunately I do not see any signs of infection I think the cast is doing an awesome job here. No fevers, chills, nausea, vomiting, or diarrhea. 03-07-2022 upon evaluation today patient appears to be doing well with regard to her wound although the collagen is very stuck and dry on the area on the tip of her toe at this point. Nonetheless I discussed with her that I am a little reluctant to put the cast on this week due to what appeared to been a  blister along the left side of her toe laterally. Nonetheless this has been a little worried about the possibility of infection and I do not see anything obvious I think that we may not want to put the cast on this week just to be on the safe side she voiced understanding. With that being said if were not to use the cast she is going to need to make sure to have still some pressure relief I think making gauze bolster with 3 gauze up underneath the toe is going to do a good job here. We will roll and tape this and then subsequently anchored underneath the big toe should be using the front offloading shoe as well. 7/26; patient I know from a previous stay in this clinic. Type II diabetic with a wound on her right plantar great toe. She was in a total contact cast to 2 weeks ago the wound was close to closed last week according to our intake nurse. We put Xeroform and gauze on this that the patient has been changing her self. She has a forefoot offloading boot. 03-21-2022 upon a variable evaluation today patient appears to be doing somewhat poorly in regard to callus buildup at this time. Fortunately I do not see any evidence of overall worsening but this is just still open. No fevers, chills, nausea, vomiting, or diarrhea. I think we may have to go back into the cast next week if this is not significantly improved by that time. 03-28-2022 upon evaluation today patient's wound is actually showing signs of significant improvement which is great news. The cast over the past week he has done a great job. This is so small however but still has a little bit of a deeper area that I am concerned about the possibility of it drying out too much under the cast that is what we saw today and callused over. Subsequently I did remove the callus. 04-11-2022 upon evaluation today patient appears to still have a lot of callus buildup today. She was sick with COVID last weeks has been 2 weeks since have seen her. This is what  accounts for some of the callus buildup in my  opinion. With that being said she tells me she was not nearly as active working from home with COVID as she can go in. Nonetheless I do believe still that based on what we are seeing this is still too much friction is likely occurring around the toe area. 04-18-2022 upon evaluation today patient appears to be doing excellent in regard to her wound in fact this appears to be completely healed which is great news. Fortunately I do not see any evidence of active infection locally or systemically at this time which is excellent as well. No fevers, chills, nausea, vomiting, or diarrhea. 06/29/2022 Donna [name], Donna [name] ([id number]) [phone].pdf Page 3 of 9 Ms. Donna Palms is a 57 year old female with a past medical history of uncontrolled type 2 diabetes that presents to the clinic for a wound to the right foot. On 06/02/2022 she presented to the hospital for cellulitis of the right foot and found to have osteomyelitis of the fifth toe. She had a 5th ray amputation on 10/14. Her culture grew Staph aureus. She was placed on 10 days of linezolid by infectious disease. She has been doing a dry dressing for the past 3-4 weeks. She currently denies systemic signs of infection. 11/17; patient presents for follow-up. She has been using Santyl and silver alginate to the wound bed daily. She reports using Vashe wound cleanser between dressing changes. She is taking doxycycline currently. She has no issues or complaints today. 11/28; patient presents for follow-up. She has been using Santyl and silver alginate to the wound bed without issues. She completed her course of doxycycline. She denies signs of infection. 12/5; patient presents for follow-up. She has been using Santyl and silver alginate to the wound bed. She has no issues or complaints today. She denies signs of infection. 12/14; patient presents for follow-up. She has been using  Santyl and Hydrofera Blue to the wound bed. There is been improvement in wound healing. She denies signs of infection. 12/21; patient presents for follow-up. She has been using Santyl and Hydrofera Blue to the wound bed. There continues to be improvement in wound healing. She has no issues or complaints today. 12/20; patient presents for follow-up. She has been using Hydrofera Blue to the wound bed. She had devitalized tissue on exam and after removal the area appears almost epithelialized. She has no issues or complaints today. 1/4; patient presents for follow-up. She has been using Hydrofera Blue to the wound bed. Her wound is healed. Electronic Signature(s) Signed: 08/23/2022 9:29:55 AM By: Geralyn Corwin DO Entered By: Geralyn Corwin on 08/23/2022 09:27:54 -------------------------------------------------------------------------------- Physical Exam Details Patient Name: Date of Service: Donna Bollman 08/23/2022 8:00 A M Medical Record Number: [id number] Patient Account Number: [id number] Date of Birth/Sex: Treating RN: [birth year] (57 y.o. F) Primary Care Provider: Hillard Danker Other Clinician: Referring Provider: Treating Provider/Extender: Leandrew Koyanagi in Treatment: 7 Constitutional respirations regular, non-labored and within target range for patient.. Cardiovascular 2+ dorsalis pedis/posterior tibialis pulses. Psychiatric pleasant and cooperative. Notes Right foot: T the lateral/dorsal aspect there is epithelization to the previous wound site. No signs of surrounding infection. o Electronic Signature(s) Signed: 08/23/2022 9:29:55 AM By: Geralyn Corwin DO Entered By: Geralyn Corwin on 08/23/2022 09:28:24 -------------------------------------------------------------------------------- Physician Orders Details Patient Name: Date of Service: Donna Bollman. 08/23/2022 8:00 A M Medical Record Number: [id number] Patient Account  Number: [id number] Date of Birth/Sex: Treating RN: [birth year] (57 y.o. Ardis Rowan, [name] Primary Care Provider: Hillard Danker Other Clinician: Referring Provider: Treating Provider/Extender: Barrington Ellison,  [place] Siren in Treatment: [place], [place] [name] ([id number]) [id number] Page 4 of 9 Verbal / Phone Orders: No Diagnosis Coding Discharge From [facility] Services Discharge from [place] Signature(s) Signed: 08/23/2022 9:29:55 AM By: Kalman Shan DO Entered By: Kalman Shan on 08/23/2022 09:28:30 -------------------------------------------------------------------------------- Problem List Details Patient Name: Date of Service: Donna [name]. 08/23/2022 8:00 A M Medical Record Number: [id number] Patient Account Number: [id number] Date of Birth/Sex: Treating RN: [birth year] (57 y.o. F) Primary Care Provider: Pricilla Holm Other Clinician: Referring Provider: Treating Provider/Extender: Verlene Mayer in Treatment: 7 Active Problems ICD-10 Encounter Code Description Active Date MDM Diagnosis S91.301A Unspecified open wound, right foot, initial encounter 06/29/2022 No Yes E11.621 Type 2 diabetes mellitus with foot ulcer 06/29/2022 No Yes I96 Gangrene, not elsewhere classified 06/29/2022 No Yes M86.9 Osteomyelitis, unspecified 06/29/2022 No Yes Inactive Problems Resolved Problems Electronic Signature(s) Signed: 08/23/2022 9:29:55 AM By: Kalman Shan DO Entered By: Kalman Shan on 08/23/2022 09:27:22 -------------------------------------------------------------------------------- Progress Note Details Patient Name: Date of Service: Donna [name]. 08/23/2022 8:00 A M Medical Record Number: [id number] Patient Account Number: [id number] Date of Birth/Sex: Treating RN: Aug 10, 1966 (57 y.o. 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 She tells me that since at least last summer she has had a progressive callused area that has opened closed but is never really healed. She has a history of calluses on her feet. She is a diabetic with neuropathy. She was referred to Dr. Posey Pronto at [facility] who felt that this might be a large plantar wart she was treated with laser for 2 treatments and then subsequently with ammonium lactate none of this is apparently made any difference according to the patient. She has a very odd looking area with thick skin and callus and an almost crossed like area of open tissue. She also has erythema above this which she says is chronic. The other interesting part of her recent history is recurrent cellulitis on the right lower leg although this is separated from the area we are looking at. She has had trips to see infectious disease has been on Keflex for as long as 20 days. This is presumed to be strep. She saw Dr. Gale Journey Past medical history includes type 2 diabetes, recurrent cellulitis of the right leg, right Achilles tendon repair a year ago for spontaneous rupture ABI in our clinic was 1.09 on the right 2/17; patient admitted the clinic last week had a thick callused area on the right lateral heel she is a diabetic with neuropathy. She had thick callus and across like area of open wound. I aggressively debrided this area to remove most of the callus and gave her Hydrofera Blue and heel off loader, heel cup and she has been wearing this  religiously trying to keep the pressure off the area. She comes in today with a cross-like area of injury has healed she has a new open area that I do not remember from last time. Still some dry flaking skin and callus but not nearly as much as last week at which time I had to use a scalpel to remove  this. 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 She came in 2 weeks ago with a very thick hyperkeratotic callused area on her right lateral heel. There was a "crossed" shaped area of split tissue with an open wound at the bottom. This had previously been treated as a possible wart without improvement. I removed all of the callus when she first came in here. This is mostly epithelialized over still thick skin but nothing like it was. She has 1 small open area that has not healed she has been more religious about offloading the area 10/27/2020 upon evaluation today patient appears to be doing well with regard to her wound although she does still apparently appear to have something still open in the base of the wound. Fortunately there is no evidence of infection at this time which is great news and in general I feel like she is doing decently well though she has a lot of callus buildup she is using the offloading shoe for her heel. At home she does not always wear this she tells me. Generally she is using flip-flops there. Of note her primary care provider did give her Keflex yesterday for cellulitis of this leg but has nothing to do with the heel location. 3/17; the patient's area actually looks a lot better. There is no open wound here. Much less callus. She was given a prescription for 40% urea cream I am not sure that really is required this week. She is going to require less friction on this area. I think she is going to need a different type of shoe going forward Readmission: 12-13-2028 upon evaluation today patient presents for reevaluation in the clinic she was last seen by  Dr. Leanord Hawking on 11-03-2020 when she was discharged this was due to a crack on her heel. Currently she is actually having an issue with her great toe on the right foot. She tells me that this has been present currently for a couple of weeks since around November 22, 2021. She has had a lot of callus over this area in general but not an open wound until that time. Subsequently this has more recently opened and has Become a greater concern she is also noted some odor which is disconcerting to her. She tells me that her greatest concern is that "she does not lose this toe". Again I completely understand her concern in this regard I definitely think we need to be aggressive in treating this and try to get things better. She is very Adult nurse. Patient does have a history of diabetes mellitus type 2 with diabetic neuropathy. She also tells me that she does have a history as well of hypertension which is pretty well controlled. She has not yet had an x-ray of the area and has not been on any antibiotics. Both are things I think we will need to do today. 12-20-2021 upon evaluation today patient appears to be doing well with regard to her toe ulcer all things considered. She has been tolerating the dressing changes without complication. I do have her x-ray as well as her culture for review today the culture did so Staphylococcus aureus no MRSA noted which was great news susceptible to doxycycline which is what I did place her on last week. Overall the odor that she was noticing has also cleared which is great news. With regard to the x-ray there does not appear to be any signs of osteomyelitis or bone erosion at this point which is also  great news in general and very pleased with where things stand. 12-27-2021 upon evaluation today patient appears to be doing well with regard to her wound this is actually measuring significantly better which is great news. Overall I am extremely pleased with where we stand today. No  fevers, chills, nausea, vomiting, or diarrhea. 01-03-2022 upon evaluation today patient's wound is actually showing signs of significant improvement which is also news. I am very pleased with what we are seeing there is a little bit of callus buildup still but we are going to work on that today otherwise she seems to be making excellent progress which is great news. 01-10-2022 upon evaluation today patient appears to be doing well currently in regard to her toe ulcer. She has been tolerating the dressing changes without complication. Fortunately there does not appear to be any evidence of active infection locally or systemically which is great news. No fevers, chills, nausea, vomiting, or diarrhea. 01-17-2022 upon evaluation today patient's toe was actually doing about the same this week there is not a tremendous improvement in size but overall the appearance of the wound is better it is much flatter I do think the [facility] has done well in that regard. We will still wait to hear back on the skin substitute. With that being said I do believe that the patient could benefit from again the skin substitute to try to help get this to heal much more effectively and quickly in the meantime I am trying to help flatten this out which I think is doing a pretty good job.. 6/7; wound on the plantar right great toe over the distal phalanx. Wound is slightly smaller but looks healthier there is rims of epithelialization she still has some callus on the toe indicative of inadequate pressure/friction relief. I talked to her about making sure in the forefoot offloading boot that the weight stays on her heel in stance phase 01-31-2022 upon evaluation patient's wound is still continuing to show signs of really minimal progress unfortunately. I actually believe that she may need to go into the total contact cast in order to get this to heal and I discussed that with her today as well. Obviously this has been a little  bit of a barrier for her not because she does not want to do it and does not want to get healed but rather because it is going to prevent her from being able to drive which is obviously going to be a significant quality-of-life issue with what she does in the position she is in at work. 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 I do believe she could work with the cast on but the issue simply is that she cannot drive to get to work. Otherwise should be able to walk and do what she needs to do. 02-07-2022 upon evaluation today patient appears to be doing well with regard to her wounds. She has been tolerating the dressing changes without complication. Fortunately I do not see any signs of infection although the wound itself is not significantly smaller as of yet. This is something that we are trying to work on I do believe a total contact cast would be the right way to go. I discussed this with the patient today as well. She is actually in agreement with giving this a trial although she is a little nervous about it being that she is claustrophobic. 6/23; patient presents for  obligatory cast exchange. She reports that her heel feels sore from the total contact cast. On that she had no issues. Donna [name], Wanda [name] ([id number]) [id number] Page 6 of 9 02-14-2022 upon evaluation today patient's wound is actually showing signs of excellent improvement. There does not appear to be any signs of active infection at this time and overall the toe looks amazing in just 1 week's time. We have come down almost half compared to where we were before size wise and this is also him as well. Overall I think that the casting has done extremely good for her and the toe in general looks significantly improved. I think this week since we want to have to mess with the wound bed itself just the callus around she will likely have even better  improvement come next time. 02-21-2022 upon evaluation today patient appears to be doing well with regard to her toe. She is actually showing signs of improvement this is a little bit slower than she would like to see but nonetheless we are seeing significant changes in the right direction in my opinion. Fortunately she is also developing a lot less callus on the end of the toe which is helpful for her as well in my opinion. 02-28-2022 upon evaluation today patient appears to be doing well currently in regard to her wound. She has been tolerating the dressing changes. Fortunately I do not see any signs of infection I think the cast is doing an awesome job here. No fevers, chills, nausea, vomiting, or diarrhea. 03-07-2022 upon evaluation today patient appears to be doing well with regard to her wound although the collagen is very stuck and dry on the area on the tip of her toe at this point. Nonetheless I discussed with her that I am a little reluctant to put the cast on this week due to what appeared to been a blister along the left side of her toe laterally. Nonetheless this has been a little worried about the possibility of infection and I do not see anything obvious I think that we may not want to put the cast on this week just to be on the safe side she voiced understanding. With that being said if were not to use the cast she is going to need to make sure to have still some pressure relief I think making gauze bolster with 3 gauze up underneath the toe is going to do a good job here. We will roll and tape this and then subsequently anchored underneath the big toe should be using the front offloading shoe as well. 7/26; patient I know from a previous stay in this clinic. Type II diabetic with a wound on her right plantar great toe. She was in a total contact cast to 2 weeks ago the wound was close to closed last week according to our intake nurse. We put Xeroform and gauze on this that the patient has  been changing her self. She has a forefoot offloading boot. 03-21-2022 upon a variable evaluation today patient appears to be doing somewhat poorly in regard to callus buildup at this time. Fortunately I do not see any evidence of overall worsening but this is just still open. No fevers, chills, nausea, vomiting, or diarrhea. I think we may have to go back into the cast next week if this is not significantly improved by that time. 03-28-2022 upon evaluation today patient's wound is actually showing signs of significant improvement which is great news. The cast over the past week  he has done a great job. This is so small however but still has a little bit of a deeper area that I am concerned about the possibility of it drying out too much under the cast that is what we saw today and callused over. Subsequently I did remove the callus. 04-11-2022 upon evaluation today patient appears to still have a lot of callus buildup today. She was sick with COVID last weeks has been 2 weeks since have seen her. This is what accounts for some of the callus buildup in my opinion. With that being said she tells me she was not nearly as active working from home with COVID as she can go in. Nonetheless I do believe still that based on what we are seeing this is still too much friction is likely occurring around the toe area. 04-18-2022 upon evaluation today patient appears to be doing excellent in regard to her wound in fact this appears to be completely healed which is great news. Fortunately I do not see any evidence of active infection locally or systemically at this time which is excellent as well. No fevers, chills, nausea, vomiting, or diarrhea. 06/29/2022 Ms. Donna [name] is a 57 year old female with a past medical history of uncontrolled type 2 diabetes that presents to the clinic for a wound to the right foot. On 06/02/2022 she presented to the hospital for cellulitis of the right foot and found to have  osteomyelitis of the fifth toe. She had a 5th ray amputation on 10/14. Her culture grew Staph aureus. She was placed on 10 days of linezolid by infectious disease. She has been doing a dry dressing for the past 3-4 weeks. She currently denies systemic signs of infection. 11/17; patient presents for follow-up. She has been using Santyl and silver alginate to the wound bed daily. She reports using Vashe wound cleanser between dressing changes. She is taking doxycycline currently. She has no issues or complaints today. 11/28; patient presents for follow-up. She has been using Santyl and silver alginate to the wound bed without issues. She completed her course of doxycycline. She denies signs of infection. 12/5; patient presents for follow-up. She has been using Santyl and silver alginate to the wound bed. She has no issues or complaints today. She denies signs of infection. 12/14; patient presents for follow-up. She has been using Santyl and Hydrofera Blue to the wound bed. There is been improvement in wound healing. She denies signs of infection. 12/21; patient presents for follow-up. She has been using Santyl and Hydrofera Blue to the wound bed. There continues to be improvement in wound healing. She has no issues or complaints today. 12/20; patient presents for follow-up. She has been using Hydrofera Blue to the wound bed. She had devitalized tissue on exam and after removal the area appears almost epithelialized. She has no issues or complaints today. 1/4; patient presents for follow-up. She has been using Hydrofera Blue to the wound bed. Her wound is healed. Patient History Information obtained from Patient. Family History Cancer - Father, Diabetes - Mother, Heart Disease - Father,Mother. Social History Never smoker, Marital Status - Single, Alcohol Use - Never, Drug Use - No History, Caffeine Use - Daily. Medical History Cardiovascular Patient has history of  Hypertension Endocrine Patient has history of Type II Diabetes Neurologic Patient has history of Neuropathy Hospitalization/Surgery History - right exicision haglund's deformity wih achilles tendon repair 2020. 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 Medical A Surgical History Notes nd Cardiovascular Hyperlipidemia Diego,  Marlinda [name] ([id number]) [id number] Page 7 of 9 Objective Constitutional respirations regular, non-labored and within target range for patient.. Vitals Time Taken: 8:21 AM, Temperature: 98.7 F, Pulse: 74 bpm, Respiratory Rate: 17 breaths/min, Blood Pressure: 148/67 mmHg. Cardiovascular 2+ dorsalis pedis/posterior tibialis pulses. Psychiatric pleasant and cooperative. General Notes: Right foot: T the lateral/dorsal aspect there is epithelization to the previous wound site. No signs of surrounding infection. o Integumentary (Hair, Skin) Wound #3 status is Open. Original cause of wound was Surgical Injury. The date acquired was: 05/21/2022. The wound has been in treatment 7 weeks. The wound is located on the Right Amputation Site - T The wound measures 0cm length x 0cm width x 0cm depth; 0cm^2 area and 0cm^3 volume. There is no oe. tunneling or undermining noted. There is a none present amount of drainage noted. The wound margin is distinct with the outline attached to the wound base. There is no granulation within the wound bed. There is no necrotic tissue within the wound bed. The periwound skin appearance did not exhibit: Callus, Crepitus, Excoriation, Induration, Rash, Scarring, Dry/Scaly, Maceration, Atrophie Blanche, Cyanosis, Ecchymosis, Hemosiderin Staining, Mottled, Pallor, Rubor, Erythema. Periwound temperature was noted as No Abnormality. Assessment Active Problems ICD-10 Unspecified open wound, right foot, initial encounter Type 2 diabetes mellitus with foot ulcer Gangrene, not elsewhere  classified Osteomyelitis, unspecified Patient's wound has done well with Hydrofera Blue. This has healed. I recommended following up with podiatry for custom orthotics. For now she can keep the area padded daily for the next week. She may follow-up as needed. Plan Discharge From [facility] Services: Discharge from [place] 1. Discharge from clinic due to closed wound 2. Follow-up as needed 3. Follow-up with podiatry for custom orthotics Electronic Signature(s) Signed: 08/23/2022 9:29:55 AM By: Kalman Shan DO Entered By: Kalman Shan on 08/23/2022 09:29:21 -------------------------------------------------------------------------------- HxROS Details Patient Name: Date of Service: Donna [name] 08/23/2022 8:00 A Rosanne Gutting, Tobin Chad [name] ([id number]) [id number] Page 8 of 9 Medical Record Number: [id number] Patient Account Number: [id number] Date of Birth/Sex: Treating RN: Mar 28, 1966 (57 y.o. F) Primary Care Provider: Pricilla Holm Other Clinician: Referring Provider: Treating Provider/Extender: Verlene Mayer in Treatment: 7 Information Obtained From Patient Cardiovascular Medical History: Positive for: Hypertension Past Medical History Notes: Hyperlipidemia Endocrine Medical History: Positive for: Type II Diabetes Treated with: Insulin, Oral agents Blood sugar tested every day: No Neurologic Medical History: Positive for: Neuropathy Immunizations Pneumococcal Vaccine: Received Pneumococcal Vaccination: No Implantable Devices None Hospitalization / Surgery History Type of Hospitalization/Surgery right exicision haglund's deformity wih achilles tendon repair 2020 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 Family and Social History Cancer: Yes - Father; Diabetes: Yes - Mother; Heart Disease: Yes - Father,Mother; Never smoker; Marital Status - Single; Alcohol Use: Never; Drug Use:  No History; Caffeine Use: Daily; Financial Concerns: No; Food, Clothing or Shelter Needs: No; Support System Lacking: No; Transportation Concerns: No Electronic Signature(s) Signed: 08/23/2022 9:29:55 AM By: Kalman Shan DO Entered By: Kalman Shan on 08/23/2022 09:27:59 -------------------------------------------------------------------------------- SuperBill Details Patient Name: Date of Service: Donna [name] 08/23/2022 Medical Record Number: [id number] Patient Account Number: [id number] Date of Birth/Sex: Treating RN: [birth year] (57 y.o. [name], [name] Primary Care Provider: Pricilla Holm Other Clinician: Referring Provider: Treating Provider/Extender: Verlene Mayer in Treatment: 7 Diagnosis Coding ICD-10 Codes Code Description E08.144Y Unspecified open wound, right foot, initial encounter E11.621 Type 2 diabetes mellitus with foot ulcer I96 Gangrene, not elsewhere classified CHIAMAKA, LATKA ([id number]) [id number] Page 9 of 9  M86.9 Osteomyelitis, unspecified Facility Procedures : CPT4 Code: [id number] Description: 99213 - WOUND CARE VISIT-LEV 3 EST PT Modifier: Quantity: 1 Physician Procedures : CPT4 Code Description Modifier [id number] 99213 - WC PHYS LEVEL 3 - EST PT ICD-10 Diagnosis Description S91.301A Unspecified open wound, right foot, initial encounter E11.621 Type 2 diabetes mellitus with foot ulcer I96 Gangrene, not elsewhere classified  M86.9 Osteomyelitis, unspecified Quantity: 1 Electronic Signature(s) Signed: 08/23/2022 9:29:55 AM By: Geralyn Corwin DO Entered By: Geralyn Corwin on 08/23/2022 09:29:32

## 2022-08-23 NOTE — Progress Notes (Signed)
Donna [name] ([id number]) [id number] Page 1 of 7 Visit Report for 08/09/2022 Arrival Information Details Patient Name: Date of Service: Donna [name], Donna [name] 08/09/2022 8:00 A M Medical Record Number: [id number] Patient Account Number: [id number] Date of Birth/Sex: Treating RN: 06-13-1966 (57 y.o. [name], Meta.Reding Primary Care Lirio Bach: Pricilla Holm Other Clinician: Referring Effrey Davidow: Treating Ivanka Kirshner/Extender: Verlene Mayer in Treatment: 5 Visit Information History Since Last Visit Added or deleted any medications: No Patient Arrived: Ambulatory Any new allergies or adverse reactions: No Arrival Time: 08:14 Had a fall or experienced change in No Accompanied By: self activities of daily living that may affect Transfer Assistance: None risk of falls: Patient Identification Verified: Yes Signs or symptoms of abuse/neglect since last visito No Secondary Verification Process Completed: Yes Hospitalized since last visit: No Patient Requires Transmission-Based Precautions: No Implantable device outside of the clinic excluding No Patient Has Alerts: Yes cellular tissue based products placed in the center Patient Alerts: ABI's: [name]:1.16 11/2021 since last visit: Has Dressing in Place as Prescribed: Yes Has Footwear/Offloading in Place as Prescribed: Yes Right: Wedge Shoe Pain Present Now: No Electronic Signature(s) Signed: 08/09/2022 6:15:21 PM By: Deon Pilling RN, BSN Entered By: Deon Pilling on 08/09/2022 08:15:05 -------------------------------------------------------------------------------- Lower Extremity Assessment Details Patient Name: Date of Service: Donna [name] 08/09/2022 8:00 A M Medical Record Number: [id number] Patient Account Number: [id number] Date of Birth/Sex: Treating RN: 1966-08-11 (57 y.o. Donna [name] Primary Care Alylah Blakney: Pricilla Holm Other Clinician: Referring  Cheralyn Oliver: Treating Jolee Critcher/Extender: Verlene Mayer in Treatment: 5 Edema Assessment Assessed: [Left: No] [Right: Yes] Edema: [Left: N] [Right: o] Calf Left: Right: Point of Measurement: 31 cm From Medial Instep 34 cm Ankle Left: Right: Point of Measurement: 11 cm From Medial Instep 22 cm Vascular Assessment Pulses: Dorsalis Pedis Karapetian, Donna [name] ([id number]) [[id number] Page 2 of 7 Yes] Electronic Signature(s) Signed: 08/09/2022 6:15:21 PM By: Deon Pilling RN, BSN Entered By: Deon Pilling on 08/09/2022 08:15:41 -------------------------------------------------------------------------------- Multi Wound Chart Details Patient Name: Date of Service: Donna [name]. 08/09/2022 8:00 A M Medical Record Number: [id number] Patient Account Number: [id number] Date of Birth/Sex: Treating RN: 30-Oct-1965 (57 y.o. F) Primary Care Adisen Bennion: Pricilla Holm Other Clinician: Referring Kanai Berrios: Treating Siana Panameno/Extender: Verlene Mayer in Treatment: 5 Vital Signs Height(in): Pulse(bpm): 84 Weight(lbs): Blood Pressure(mmHg): 132/84 Body Mass Index(BMI): Temperature(F): 98 Respiratory Rate(breaths/min): 20 [3:Photos:] [N/A:N/A] Right Amputation Site - T oe N/A N/A Wound Location: Surgical Injury N/A N/A Wounding Event: Diabetic Wound/Ulcer of the Lower N/A N/A Primary Etiology: Extremity Open Surgical Wound N/A N/A Secondary Etiology: Hypertension, Type II Diabetes, N/A N/A Comorbid History: Neuropathy 05/21/2022 N/A N/A Date Acquired: 5 N/A N/A Weeks of Treatment: Open N/A N/A Wound Status: No N/A N/A Wound Recurrence: 0.2x0.2x0.1 N/A N/A Measurements L x W x D (cm) 0.031 N/A N/A A (cm) : rea 0.003 N/A N/A Volume (cm) : 99.40% N/A N/A % Reduction in A rea: 99.90% N/A N/A % Reduction in Volume: Grade 4 N/A N/A Classification: Medium N/A N/A Exudate A  mount: Serosanguineous N/A N/A Exudate Type: red, brown N/A N/A Exudate Color: Distinct, outline attached N/A N/A Wound Margin: Large (67-100%) N/A N/A Granulation A mount: Pink, Pale N/A N/A Granulation Quality: None Present (0%) N/A N/A Necrotic A mount: Fat Layer (Subcutaneous Tissue): Yes N/A N/A Exposed Structures: Fascia: No Tendon: No Muscle: No Joint: No Bone: No Large (67-100%) N/A N/A Epithelialization: Debridement - Excisional N/A N/A Debridement: Pre-procedure Verification/Time Out 08:50 N/A N/A Taken: Lidocaine N/A  N/A Pain Control: Callus, Subcutaneous, Slough N/A N/A Tissue Debrided: Skin/Subcutaneous Tissue N/A N/A LevelAVALEIGH, [name] ([id number]) [id number] Page 3 of 7 0.04 N/A N/A Debridement A (sq cm): rea Curette N/A N/A Instrument: Minimum N/A N/A Bleeding: Pressure N/A N/A Hemostasis Achieved: 0 N/A N/A Procedural Pain: 0 N/A N/A Post Procedural Pain: Procedure was tolerated well N/A N/A Debridement Treatment Response: 0.2x0.2x0.1 N/A N/A Post Debridement Measurements L x W x D (cm) 0.003 N/A N/A Post Debridement Volume: (cm) Callus: Yes N/A N/A Periwound Skin Texture: Excoriation: No Induration: No Crepitus: No Rash: No Scarring: No Maceration: No N/A N/A Periwound Skin Moisture: Dry/Scaly: No Atrophie Blanche: No N/A N/A Periwound Skin Color: Cyanosis: No Ecchymosis: No Erythema: No Hemosiderin Staining: No Mottled: No Pallor: No Rubor: No No Abnormality N/A N/A Temperature: Debridement N/A N/A Procedures Performed: Treatment Notes Electronic Signature(s) Signed: 08/09/2022 11:15:03 AM By: Geralyn Corwin DO Entered By: Geralyn Corwin on 08/09/2022 08:56:40 -------------------------------------------------------------------------------- Multi-Disciplinary Care Plan Details Patient Name: Date of Service: Donna [name]. 08/09/2022 8:00 A M Medical Record Number:  [id number] Patient Account Number: [id number] Date of Birth/Sex: Treating RN: 1965-11-17 (57 y.o. Donna [name], [name] Primary Care Branda Chaudhary: Hillard Danker Other Clinician: Referring Huan Pollok: Treating Yocelyn Brocious/Extender: Leandrew Koyanagi in Treatment: 5 Active Inactive Necrotic Tissue Nursing Diagnoses: Impaired tissue integrity related to necrotic/devitalized tissue Goals: Necrotic/devitalized tissue will be minimized in the wound bed Date Initiated: 06/29/2022 Target Resolution Date: 08/17/2022 Goal Status: Active Patient/caregiver will verbalize understanding of reason and process for debridement of necrotic tissue Date Initiated: 06/29/2022 Target Resolution Date: 08/17/2022 Goal Status: Active Interventions: Assess patient pain level pre-, during and post procedure and prior to discharge Provide education on necrotic tissue and debridement process Treatment Activities: Apply topical anesthetic as ordered : 06/29/2022 Notes: Pain, Acute or Chronic Donna [name], Donna [name] ([id number]) [phone].pdf Page 4 of 7 Nursing Diagnoses: Potential alteration in comfort, pain Goals: Patient will verbalize adequate pain control and receive pain control interventions during procedures as needed Date Initiated: 06/29/2022 Target Resolution Date: 08/17/2022 Goal Status: Active Interventions: Complete pain assessment as per visit requirements Encourage patient to take pain medications as prescribed Provide education on pain management Treatment Activities: Administer pain control measures as ordered : 06/29/2022 Notes: Wound/Skin Impairment Nursing Diagnoses: Knowledge deficit related to ulceration/compromised skin integrity Goals: Patient/caregiver will verbalize understanding of skin care regimen Date Initiated: 06/29/2022 Target Resolution Date: 08/17/2022 Goal Status: Active Interventions: Assess patient/caregiver ability to  perform ulcer/skin care regimen upon admission and as needed Assess ulceration(s) every visit Provide education on ulcer and skin care Treatment Activities: Skin care regimen initiated : 06/29/2022 Topical wound management initiated : 06/29/2022 Notes: Electronic Signature(s) Signed: 08/22/2022 5:35:40 PM By: Fonnie Mu RN Entered By: Fonnie Mu on 08/09/2022 08:53:15 -------------------------------------------------------------------------------- Pain Assessment Details Patient Name: Date of Service: Donna [name] 08/09/2022 8:00 A M Medical Record Number: [id number] Patient Account Number: [id number] Date of Birth/Sex: Treating RN: Dec 31, 1965 (57 y.o. [name] Primary Care Laurey Salser: Hillard Danker Other Clinician: Referring Riot Barrick: Treating Orlandria Kissner/Extender: Leandrew Koyanagi in Treatment: 5 Active Problems Location of Pain Severity and Description of Pain Patient Has Paino No Site Locations Rate the pain. Donna [name], Donna [name] ([id number]) [id number] Page 5 of 7 Rate the pain. Current Pain Level: 0 Pain Management and Medication Current Pain Management: Medication: No Cold Application: No Rest: No Massage: No Activity: No T.E.N.S.: No Heat Application: No Leg drop or elevation: No Is the Current Pain Management Adequate: Adequate How does your wound  impact your activities of daily livingo Sleep: No Bathing: No Appetite: No Relationship With Others: No Bladder Continence: No Emotions: No Bowel Continence: No Work: No Toileting: No Drive: No Dressing: No Hobbies: No Engineer, maintenance) Signed: 08/09/2022 6:15:21 PM By: Deon Pilling RN, BSN Entered By: Deon Pilling on 08/09/2022 08:15:28 -------------------------------------------------------------------------------- Patient/Caregiver Education Details Patient Name: Date of Service: Donna [name], Donna [name]. 12/21/2023andnbsp8:00 [place] Record Number: [id number] Patient Account Number: [id number] Date of Birth/Gender: Treating RN: [birth year] (57 y.o. [name], [name] Primary Care Physician: Pricilla Holm Other Clinician: Referring Physician: Treating Physician/Extender: Verlene Mayer in Treatment: 5 Education Assessment Education Provided To: Patient Education Topics Provided Wound/Skin Impairment: Methods: Explain/Verbal Responses: Reinforcements needed, State content correctly Electronic Signature(s) Signed: 08/22/2022 5:35:40 PM By: [name] RN Entered By: [name] on 08/09/2022 08:53:26 Jalloh, Tobin Chad [name] ([id number]) [id number] Page 6 of 7 -------------------------------------------------------------------------------- Wound Assessment Details Patient Name: Date of Service: Donna [name], Donna [name] 08/09/2022 8:00 A M Medical Record Number: [id number] Patient Account Number: [id number] Date of Birth/Sex: Treating RN: [birth year] (57 y.o. [name], Meta.Reding Primary Care Senta Kantor: Pricilla Holm Other Clinician: Referring Tashawna Thom: Treating Juel Bellerose/Extender: Verlene Mayer in Treatment: 5 Wound Status Wound Number: 3 Primary Etiology: Diabetic Wound/Ulcer of the Lower Extremity Wound Location: Right Amputation Site - Toe Secondary Etiology: Open Surgical Wound Wounding Event: Surgical Injury Wound Status: Open Date Acquired: 05/21/2022 Comorbid History: Hypertension, Type II Diabetes, Neuropathy Weeks Of Treatment: 5 Clustered Wound: No Photos Wound Measurements Length: (cm) Width: (cm) Depth: (cm) Area: (cm) Volume: (cm) 0.2 % Reduction in Area: 99.4% 0.2 % Reduction in Volume: 99.9% 0.1 Epithelialization: Large (67-100%) 0.031 Tunneling: No 0.003 Undermining: No Wound Description Classification: Grade 4 Wound Margin: Distinct, outline attached Exudate Amount: Medium Exudate  Type: Serosanguineous Exudate Color: red, brown Foul Odor After Cleansing: No Slough/Fibrino No Wound Bed Granulation Amount: Large (67-100%) Exposed Structure Granulation Quality: Pink, Pale Fascia Exposed: No Necrotic Amount: None Present (0%) Fat Layer (Subcutaneous Tissue) Exposed: Yes Tendon Exposed: No Muscle Exposed: No Joint Exposed: No Bone Exposed: No Periwound Skin Texture Texture Color No Abnormalities Noted: No No Abnormalities Noted: No Callus: Yes Atrophie Blanche: No Crepitus: No Cyanosis: No Excoriation: No Ecchymosis: No Induration: No Erythema: No Rash: No Hemosiderin Staining: No Scarring: No Mottled: No Pallor: No Moisture Rubor: No No Abnormalities Noted: No Dry / Scaly: No Temperature / Pain Maceration: No Temperature: No Abnormality Donna [name], Donna [name] ([id number]) [id number] Page 7 of 7 Electronic Signature(s) Signed: 08/09/2022 6:15:21 PM By: Deon Pilling RN, BSN Entered By: Deon Pilling on 08/09/2022 08:17:57 -------------------------------------------------------------------------------- Vitals Details Patient Name: Date of Service: Donna [name]. 08/09/2022 8:00 A M Medical Record Number: [id number] Patient Account Number: [id number] Date of Birth/Sex: Treating RN: 1966-08-02 (57 y.o. Donna [name] Primary Care Keiarra Charon: Pricilla Holm Other Clinician: Referring Wilder Kurowski: Treating Marykathryn Carboni/Extender: Verlene Mayer in Treatment: 5 Vital Signs Time Taken: 08:15 Temperature (F): 98 Pulse (bpm): 68 Respiratory Rate (breaths/min): 20 Blood Pressure (mmHg): 132/84 Reference Range: 80 - 120 mg / dl Electronic Signature(s) Signed: 08/09/2022 6:15:21 PM By: Deon Pilling RN, BSN Entered By: Deon Pilling on 08/09/2022 08:15:20

## 2022-08-23 NOTE — Progress Notes (Signed)
Donna, [name] ([id number]) [id number] Page 1 of 10 Visit Report for 08/09/2022 Chief Complaint Document Details Patient Name: Date of Service: Donna [name], Donna [name] 08/09/2022 8:00 A M Medical Record Number: [id number] Patient Account Number: [id number] Date of Birth/Sex: Treating RN: [birth year] (57 y.o. F) Primary Care Provider: Pricilla Holm Other Clinician: Referring Provider: Treating Provider/Extender: Verlene Mayer in Treatment: 5 Information Obtained from: Patient Chief Complaint 06/29/2022; surgical wound to the right foot status post right fifth ray amputation Electronic Signature(s) Signed: 08/09/2022 11:15:03 AM By: Kalman Shan DO Entered By: Kalman Shan on 08/09/2022 08:56:47 -------------------------------------------------------------------------------- Debridement Details Patient Name: Date of Service: Donna [name]. 08/09/2022 8:00 A M Medical Record Number: [id number] Patient Account Number: [id number] Date of Birth/Sex: Treating RN: Apr 17, 1966 (57 y.o. [name], [name] Primary Care Provider: Pricilla Holm Other Clinician: Referring Provider: Treating Provider/Extender: Verlene Mayer in Treatment: 5 Debridement Performed for Assessment: Wound #3 Right Amputation Site - Toe Performed By: Physician Kalman Shan, DO Debridement Type: Debridement Severity of Tissue Pre Debridement: Fat layer exposed Level of Consciousness (Pre-procedure): Awake and Alert Pre-procedure Verification/Time Out Yes - 08:50 Taken: Start Time: 08:50 Pain Control: Lidocaine T Area Debrided (L x W): otal 0.2 (cm) x 0.2 (cm) = 0.04 (cm) Tissue and other material debrided: Callus, Slough, Subcutaneous, Slough Level: Skin/Subcutaneous Tissue Debridement Description: Excisional Instrument: Curette Bleeding: Minimum Hemostasis Achieved: Pressure End Time:  08:50 Procedural Pain: 0 Post Procedural Pain: 0 Response to Treatment: Procedure was tolerated well Level of Consciousness (Post- Awake and Alert procedure): Post Debridement Measurements of Total Wound Length: (cm) 0.2 Width: (cm) 0.2 Depth: (cm) 0.1 Volume: (cm) 0.003 Character of Wound/Ulcer Post Debridement: Improved Severity of Tissue Post Debridement: Fat layer exposed Donna, [name] R ([id number]) [phone].pdf Page 2 of 10 Post Procedure Diagnosis Same as Pre-procedure Electronic Signature(s) Signed: 08/09/2022 11:15:03 AM By: Kalman Shan DO Signed: 08/22/2022 5:35:40 PM By: [name] RN Entered By: [name] on 08/09/2022 08:52:33 -------------------------------------------------------------------------------- HPI Details Patient Name: Date of Service: Donna [name]. 08/09/2022 8:00 A M Medical Record Number: [id number] Patient Account Number: [id number] Date of Birth/Sex: Treating RN: 18-Nov-1965 (57 y.o. 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 She tells me that since at least last summer she has had a progressive callused area that has opened closed but is never really healed. She has a history of calluses on her feet. She is a diabetic with neuropathy. She was referred to Dr. [facility]osey Pronto at [facility] who felt that this might be a large plantar wart she was treated with laser for 2 treatments and then subsequently with ammonium lactate none of this is apparently made any difference according to the patient. She has a very odd looking area with thick skin and callus and an almost crossed like area of open tissue. She also has erythema above  this which she says is chronic. The other interesting part of her recent history is recurrent cellulitis on the right lower leg although this is separated from the area we are looking at. She has had trips to see infectious disease has been on Keflex for as long as 20 days. This is presumed to be strep. She saw Dr. Gale Journey Past medical history includes type 2 diabetes, recurrent cellulitis of the right leg, right  Achilles tendon repair a year ago for spontaneous rupture ABI in our clinic was 1.09 on the right 2/17; patient admitted the clinic last week had a thick callused area on the right lateral heel she is a diabetic with neuropathy. She had thick callus and across like area of open wound. I aggressively debrided this area to remove most of the callus and gave her Hydrofera Blue and heel off loader, heel cup and she has been wearing this religiously trying to keep the pressure off the area. She comes in today with a cross-like area of injury has healed she has a new open area that I do not remember from last time. Still some dry flaking skin and callus but not nearly as much as last week at which time I had to use a scalpel to remove this. 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 She came in 2 weeks ago with a very thick hyperkeratotic callused area on her right lateral heel. There was a "crossed" shaped area of split tissue with an open wound at the bottom. This had previously been treated as a possible wart without improvement. I removed all of the callus when she first came in here. This is mostly epithelialized over still thick skin but nothing like it was. She has 1 small open area that has not healed she has been more religious about offloading the area 10/27/2020 upon evaluation today patient appears to be doing well with regard to her wound although she does still apparently appear to have something still open in the base of the wound. Fortunately there is no evidence  of infection at this time which is great news and in general I feel like she is doing decently well though she has a lot of callus buildup she is using the offloading shoe for her heel. At home she does not always wear this she tells me. Generally she is using flip-flops there. Of note her primary care provider did give her Keflex yesterday for cellulitis of this leg but has nothing to do with the heel location. 3/17; the patient's area actually looks a lot better. There is no open wound here. Much less callus. She was given a prescription for 40% urea cream I am not sure that really is required this week. She is going to require less friction on this area. I think she is going to need a different type of shoe going forward Readmission: 12-13-2028 upon evaluation today patient presents for reevaluation in the clinic she was last seen by Dr. Dellia Nims on 11-03-2020 when she was discharged this was due to a crack on her heel. Currently she is actually having an issue with her great toe on the right foot. She tells me that this has been present currently for a couple of weeks since around November 22, 2021. She has had a lot of callus over this area in general but not an open wound until that time. Subsequently this has more recently opened and has Become a greater concern she is also noted some odor which is disconcerting to her. She tells me that her greatest concern is that "she does not lose this toe". Again I completely understand her concern in this regard I definitely think we need to be aggressive in treating this and try to get things better. She is very Patent attorney. Patient does have a history of diabetes mellitus type 2 with diabetic neuropathy. She also tells me that she does have a history as  well of hypertension which is pretty well controlled. She has not yet had an x-ray of the area and has not been on any antibiotics. Both are things I think we will need to do today. 12-20-2021 upon evaluation  today patient appears to be doing well with regard to her toe ulcer all things considered. She has been tolerating the dressing changes without complication. I do have her x-ray as well as her culture for review today the culture did so Staphylococcus aureus no MRSA noted which was great news susceptible to doxycycline which is what I did place her on last week. Overall the odor that she was noticing has also cleared which is great news. With regard to the x-ray there does not appear to be any signs of osteomyelitis or bone erosion at this point which is also great news in general and very pleased with where things stand. 12-27-2021 upon evaluation today patient appears to be doing well with regard to her wound this is actually measuring significantly better which is great news. Overall I am extremely pleased with where we stand today. No fevers, chills, nausea, vomiting, or diarrhea. 01-03-2022 upon evaluation today patient's wound is actually showing signs of significant improvement which is also news. I am very pleased with what we are seeing there is a little bit of callus buildup still but we are going to work on that today otherwise she seems to be making excellent progress which is great news. DABRIA, GUDIEL ([id number]) [id number] Page 3 of 10 01-10-2022 upon evaluation today patient appears to be doing well currently in regard to her toe ulcer. She has been tolerating the dressing changes without complication. Fortunately there does not appear to be any evidence of active infection locally or systemically which is great news. No fevers, chills, nausea, vomiting, or diarrhea. 01-17-2022 upon evaluation today patient's toe was actually doing about the same this week there is not a tremendous improvement in size but overall the appearance of the wound is better it is much flatter I do think the [facility] has done well in that regard. We will still wait to hear back  on the skin substitute. With that being said I do believe that the patient could benefit from again the skin substitute to try to help get this to heal much more effectively and quickly in the meantime I am trying to help flatten this out which I think is doing a pretty good job.. 6/7; wound on the plantar right great toe over the distal phalanx. Wound is slightly smaller but looks healthier there is rims of epithelialization she still has some callus on the toe indicative of inadequate pressure/friction relief. I talked to her about making sure in the forefoot offloading boot that the weight stays on her heel in stance phase 01-31-2022 upon evaluation patient's wound is still continuing to show signs of really minimal progress unfortunately. I actually believe that she may need to go into the total contact cast in order to get this to heal and I discussed that with her today as well. Obviously this has been a little bit of a barrier for her not because she does not want to do it and does not want to get healed but rather because it is going to prevent her from being able to drive which is obviously going to be a significant quality-of-life issue with what she does in the position she is in at work. 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 I do believe she could work with the cast on but the issue simply is that she cannot drive to get to work. Otherwise should be able to walk and do what she needs to do. 02-07-2022 upon evaluation today patient appears to be doing well with regard to her wounds. She has been tolerating the dressing changes without complication. Fortunately I do not see any signs of infection although the wound itself is not significantly smaller as of yet. This is something that we are trying to work on I do believe a total contact cast would be the right way to go. I discussed this with the patient today as well. She is  actually in agreement with giving this a trial although she is a little nervous about it being that she is claustrophobic. 6/23; patient presents for obligatory cast exchange. She reports that her heel feels sore from the total contact cast. On that she had no issues. 02-14-2022 upon evaluation today patient's wound is actually showing signs of excellent improvement. There does not appear to be any signs of active infection at this time and overall the toe looks amazing in just 1 week's time. We have come down almost half compared to where we were before size wise and this is also him as well. Overall I think that the casting has done extremely good for her and the toe in general looks significantly improved. I think this week since we want to have to mess with the wound bed itself just the callus around she will likely have even better improvement come next time. 02-21-2022 upon evaluation today patient appears to be doing well with regard to her toe. She is actually showing signs of improvement this is a little bit slower than she would like to see but nonetheless we are seeing significant changes in the right direction in my opinion. Fortunately she is also developing a lot less callus on the end of the toe which is helpful for her as well in my opinion. 02-28-2022 upon evaluation today patient appears to be doing well currently in regard to her wound. She has been tolerating the dressing changes. Fortunately I do not see any signs of infection I think the cast is doing an awesome job here. No fevers, chills, nausea, vomiting, or diarrhea. 03-07-2022 upon evaluation today patient appears to be doing well with regard to her wound although the collagen is very stuck and dry on the area on the tip of her toe at this point. Nonetheless I discussed with her that I am a little reluctant to put the cast on this week due to what appeared to been a blister along the left side of her toe laterally. Nonetheless  this has been a little worried about the possibility of infection and I do not see anything obvious I think that we may not want to put the cast on this week just to be on the safe side she voiced understanding. With that being said if were not to use the cast she is going to need to make sure to have still some pressure relief I think making gauze bolster with 3 gauze up underneath the toe is going to do a good job here. We will roll and tape this and then subsequently anchored underneath the big toe should be using the front offloading shoe as well. 7/26; patient I know from a previous stay in this clinic. Type II diabetic with a  wound on her right plantar great toe. She was in a total contact cast to 2 weeks ago the wound was close to closed last week according to our intake nurse. We put Xeroform and gauze on this that the patient has been changing her self. She has a forefoot offloading boot. 03-21-2022 upon a variable evaluation today patient appears to be doing somewhat poorly in regard to callus buildup at this time. Fortunately I do not see any evidence of overall worsening but this is just still open. No fevers, chills, nausea, vomiting, or diarrhea. I think we may have to go back into the cast next week if this is not significantly improved by that time. 03-28-2022 upon evaluation today patient's wound is actually showing signs of significant improvement which is great news. The cast over the past week he has done a great job. This is so small however but still has a little bit of a deeper area that I am concerned about the possibility of it drying out too much under the cast that is what we saw today and callused over. Subsequently I did remove the callus. 04-11-2022 upon evaluation today patient appears to still have a lot of callus buildup today. She was sick with COVID last weeks has been 2 weeks since have seen her. This is what accounts for some of the callus buildup in my opinion. With  that being said she tells me she was not nearly as active working from home with COVID as she can go in. Nonetheless I do believe still that based on what we are seeing this is still too much friction is likely occurring around the toe area. 04-18-2022 upon evaluation today patient appears to be doing excellent in regard to her wound in fact this appears to be completely healed which is great news. Fortunately I do not see any evidence of active infection locally or systemically at this time which is excellent as well. No fevers, chills, nausea, vomiting, or diarrhea. 06/29/2022 Ms. Franz Dell is a 57 year old female with a past medical history of uncontrolled type 2 diabetes that presents to the clinic for a wound to the right foot. On 06/02/2022 she presented to the hospital for cellulitis of the right foot and found to have osteomyelitis of the fifth toe. She had a 5th ray amputation on 10/14. Her culture grew Staph aureus. She was placed on 10 days of linezolid by infectious disease. She has been doing a dry dressing for the past 3-4 weeks. She currently denies systemic signs of infection. 11/17; patient presents for follow-up. She has been using Santyl and silver alginate to the wound bed daily. She reports using Vashe wound cleanser between dressing changes. She is taking doxycycline currently. She has no issues or complaints today. 11/28; patient presents for follow-up. She has been using Santyl and silver alginate to the wound bed without issues. She completed her course of doxycycline. She denies signs of infection. 12/5; patient presents for follow-up. She has been using Santyl and silver alginate to the wound bed. She has no issues or complaints today. She denies signs of infection. 12/14; patient presents for follow-up. She has been using Santyl and Hydrofera Blue to the wound bed. There is been improvement in wound healing. She denies signs of infection. 12/21; patient presents for  follow-up. She has been using Santyl and Hydrofera Blue to the wound bed. There continues to be improvement in wound healing. She has no issues or complaints today. Electronic Signature(s) Signed: 08/09/2022 11:15:03  AM By: Kalman Shan DO [place], [place] ([id number]) [id number] Page 4 of 10 Entered By: Kalman Shan on 08/09/2022 08:57:11 -------------------------------------------------------------------------------- Physical Exam Details Patient Name: Date of Service: KYESHIA, DECATUR 08/09/2022 8:00 A M Medical Record Number: [id number] Patient Account Number: [id number] Date of Birth/Sex: Treating RN: [birth year] (57 y.o. F) Primary Care Provider: Pricilla Holm Other Clinician: Referring Provider: Treating Provider/Extender: Verlene Mayer in Treatment: 5 Constitutional respirations regular, non-labored and within target range for patient.. Cardiovascular 2+ dorsalis pedis/posterior tibialis pulses. Psychiatric pleasant and cooperative. Notes Right foot: T the lateral/dorsal aspect there is an open wound with granulation tissue and nonviable tissue. No signs of infection including increased warmth, o erythema or purulent drainage. Electronic Signature(s) Signed: 08/09/2022 11:15:03 AM By: Kalman Shan DO Entered By: Kalman Shan on 08/09/2022 08:58:24 -------------------------------------------------------------------------------- Physician Orders Details Patient Name: Date of Service: Donna [name]. 08/09/2022 8:00 A M Medical Record Number: [id number] Patient Account Number: [id number] Date of Birth/Sex: Treating RN: 02-16-1966 (56 y.o. [name], [name] Primary Care Provider: Pricilla Holm Other Clinician: Referring Provider: Treating Provider/Extender: Verlene Mayer in Treatment: 5 Verbal / Phone Orders: No Diagnosis Coding Follow-up  Appointments ppointment in 1 week. - Dr. Heber [place] (already has appt.) Return A ppointment in 2 weeks. - Dr. Heber [place] Return A Anesthetic (In clinic) Topical Lidocaine 4% applied to wound bed Bathing/ Shower/ Hygiene May shower with protection but do not get wound dressing(s) wet. Edema Control - Lymphedema / SCD / Other Elevate legs to the level of the heart or above for 30 minutes daily and/or when sitting, a frequency of: - throughout the day. Avoid standing for long periods of time. Off-Loading Open toe surgical shoe to: - office to provide a new surgical shoe. ensure no pressure to right lateral foot. Wound Treatment Wound #3 - Amputation Site - Toe Wound Laterality: Right Cleanser: Vashe 1 x Per Day/30 Days APONI, GROOMES ([id number]) [phone].pdf Page 5 of 10 Discharge Instructions: use to cleanse wound daily. Prim Dressing: Hydrofera Blue Ready Transfer Foam, 2.5x2.5 (in/in) 1 x Per Day/30 Days ary Discharge Instructions: Apply directly to wound bed as directed Secondary Dressing: Woven Gauze Sponge, Non-Sterile 4x4 in (Generic) 1 x Per Day/30 Days Discharge Instructions: Apply over primary dressing as directed. Secured With: Child psychotherapist, Sterile 2x75 (in/in) (Generic) 1 x Per Day/30 Days Discharge Instructions: Secure with stretch gauze as directed. Secured With: 20M Medipore H Soft Cloth Surgical T ape, 4 x 10 (in/yd) (Generic) 1 x Per Day/30 Days Discharge Instructions: Secure with tape as directed. Electronic Signature(s) Signed: 08/09/2022 11:15:03 AM By: Kalman Shan DO Entered By: Kalman Shan on 08/09/2022 08:58:32 -------------------------------------------------------------------------------- Problem List Details Patient Name: Date of Service: Donna [name]. 08/09/2022 8:00 A M Medical Record Number: [id number] Patient Account Number: [id number] Date of Birth/Sex: Treating RN: March 26, 1966 (57 y.o.  F) Primary Care Provider: Pricilla Holm Other Clinician: Referring Provider: Treating Provider/Extender: Verlene Mayer in Treatment: 5 Active Problems ICD-10 Encounter Code Description Active Date MDM Diagnosis S91.301A Unspecified open wound, right foot, initial encounter 06/29/2022 No Yes E11.621 Type 2 diabetes mellitus with foot ulcer 06/29/2022 No Yes I96 Gangrene, not elsewhere classified 06/29/2022 No Yes M86.9 Osteomyelitis, unspecified 06/29/2022 No Yes Inactive Problems Resolved Problems Electronic Signature(s) Signed: 08/09/2022 11:15:03 AM By: Kalman Shan DO Entered By: Kalman Shan on 08/09/2022 08:56:34 Elks, Tobin Chad R ([id number]) [id number] Page 6 of 10 -------------------------------------------------------------------------------- Progress Note Details Patient Name: Date of Service: ABAGAYLE, ROMANELLO. 08/09/2022 8:00  A M Medical Record Number: [id number] Patient Account Number: [id number] Date of Birth/Sex: Treating RN: 04/15/1966 (57 y.o. 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 She tells me that since at least last summer she has had a progressive callused area that has opened closed but is never really healed. She has a history of calluses on her feet. She is a diabetic with neuropathy. She was referred to Dr. Posey Pronto at [facility] who felt that this might be a large plantar wart she was treated with laser for 2 treatments and then subsequently with ammonium lactate  none of this is apparently made any difference according to the patient. She has a very odd looking area with thick skin and callus and an almost crossed like area of open tissue. She also has erythema above this which she says is chronic. The other interesting part of her recent history is recurrent cellulitis on the right lower leg although this is separated from the area we are looking at. She has had trips to see infectious disease has been on Keflex for as long as 20 days. This is presumed to be strep. She saw Dr. Gale Journey Past medical history includes type 2 diabetes, recurrent cellulitis of the right leg, right Achilles tendon repair a year ago for spontaneous rupture ABI in our clinic was 1.09 on the right 2/17; patient admitted the clinic last week had a thick callused area on the right lateral heel she is a diabetic with neuropathy. She had thick callus and across like area of open wound. I aggressively debrided this area to remove most of the callus and gave her Hydrofera Blue and heel off loader, heel cup and she has been wearing this religiously trying to keep the pressure off the area. She comes in today with a cross-like area of injury has healed she has a new open area that I do not remember from last time. Still some dry flaking skin and callus but not nearly as much as last week at which time I had to use a scalpel to remove this. 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 She came in 2 weeks ago with a very thick hyperkeratotic callused area on her right lateral heel. There was a "crossed" shaped area of split tissue with an open wound at the bottom. This had previously been treated as a possible wart without improvement. I removed all of the callus when she first came in here. This is mostly epithelialized over still thick skin but nothing like it was. She has 1 small open area that has not healed she has been more religious about offloading the area 10/27/2020  upon evaluation today patient appears to be doing well with regard to her wound although she does still apparently appear to have something still open in the base of the wound. Fortunately there is no evidence of infection at this time which is great news and in general I feel like she is doing decently well though she has a lot of callus buildup she is using the offloading shoe for her heel. At home she does not always wear this she tells me. Generally she is  using flip-flops there. Of note her primary care provider did give her Keflex yesterday for cellulitis of this leg but has nothing to do with the heel location. 3/17; the patient's area actually looks a lot better. There is no open wound here. Much less callus. She was given a prescription for 40% urea cream I am not sure that really is required this week. She is going to require less friction on this area. I think she is going to need a different type of shoe going forward Readmission: 12-13-2028 upon evaluation today patient presents for reevaluation in the clinic she was last seen by Dr. Dellia Nims on 11-03-2020 when she was discharged this was due to a crack on her heel. Currently she is actually having an issue with her great toe on the right foot. She tells me that this has been present currently for a couple of weeks since around November 22, 2021. She has had a lot of callus over this area in general but not an open wound until that time. Subsequently this has more recently opened and has Become a greater concern she is also noted some odor which is disconcerting to her. She tells me that her greatest concern is that "she does not lose this toe". Again I completely understand her concern in this regard I definitely think we need to be aggressive in treating this and try to get things better. She is very Patent attorney. Patient does have a history of diabetes mellitus type 2 with diabetic neuropathy. She also tells me that she does have a history as  well of hypertension which is pretty well controlled. She has not yet had an x-ray of the area and has not been on any antibiotics. Both are things I think we will need to do today. 12-20-2021 upon evaluation today patient appears to be doing well with regard to her toe ulcer all things considered. She has been tolerating the dressing changes without complication. I do have her x-ray as well as her culture for review today the culture did so Staphylococcus aureus no MRSA noted which was great news susceptible to doxycycline which is what I did place her on last week. Overall the odor that she was noticing has also cleared which is great news. With regard to the x-ray there does not appear to be any signs of osteomyelitis or bone erosion at this point which is also great news in general and very pleased with where things stand. 12-27-2021 upon evaluation today patient appears to be doing well with regard to her wound this is actually measuring significantly better which is great news. Overall I am extremely pleased with where we stand today. No fevers, chills, nausea, vomiting, or diarrhea. 01-03-2022 upon evaluation today patient's wound is actually showing signs of significant improvement which is also news. I am very pleased with what we are seeing there is a little bit of callus buildup still but we are going to work on that today otherwise she seems to be making excellent progress which is great news. 01-10-2022 upon evaluation today patient appears to be doing well currently in regard to her toe ulcer. She has been tolerating the dressing changes without complication. Fortunately there does not appear to be any evidence of active infection locally or systemically which is great news. No fevers, chills, nausea, vomiting, or diarrhea. 01-17-2022 upon evaluation today patient's toe was actually doing about the same this week there is not a tremendous improvement in size but overall the appearance of the  wound is better it is much flatter I do think the [facility] has done well in that regard. We will still wait to hear back on the skin substitute. With that being said I do believe that the patient could benefit from again the skin substitute to try to help get this to heal much more effectively and quickly in the meantime I am trying to help flatten this out which I think is doing a pretty good job.. 6/7; wound on the plantar right great toe over the distal phalanx. Wound is slightly smaller but looks healthier there is rims of epithelialization she still has some SANDA, MAKINSON R ([id number]) [id number] Page 7 of 10 callus on the toe indicative of inadequate pressure/friction relief. I talked to her about making sure in the forefoot offloading boot that the weight stays on her heel in stance phase 01-31-2022 upon evaluation patient's wound is still continuing to show signs of really minimal progress unfortunately. I actually believe that she may need to go into the total contact cast in order to get this to heal and I discussed that with her today as well. Obviously this has been a little bit of a barrier for her not because she does not want to do it and does not want to get healed but rather because it is going to prevent her from being able to drive which is obviously going to be a significant quality-of-life issue with what she does in the position she is in at work. 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 I do believe she could work with the cast on but the issue simply is that she cannot drive to get to work. Otherwise should be able to walk and do what she needs to do. 02-07-2022 upon evaluation today patient appears to be doing well with regard to her wounds. She has been tolerating the dressing changes without complication. Fortunately I do not see any signs of infection although the wound itself  is not significantly smaller as of yet. This is something that we are trying to work on I do believe a total contact cast would be the right way to go. I discussed this with the patient today as well. She is actually in agreement with giving this a trial although she is a little nervous about it being that she is claustrophobic. 6/23; patient presents for obligatory cast exchange. She reports that her heel feels sore from the total contact cast. On that she had no issues. 02-14-2022 upon evaluation today patient's wound is actually showing signs of excellent improvement. There does not appear to be any signs of active infection at this time and overall the toe looks amazing in just 1 week's time. We have come down almost half compared to where we were before size wise and this is also him as well. Overall I think that the casting has done extremely good for her and the toe in general looks significantly improved. I think this week since we want to have to mess with the wound bed itself just the callus around she will likely have even better improvement come next time. 02-21-2022 upon evaluation today patient appears to be doing well with regard to her toe. She is actually showing signs of improvement this is a little bit slower than she would like to see but nonetheless we are seeing significant changes in the right direction in my opinion. Fortunately she is  also developing a lot less callus on the end of the toe which is helpful for her as well in my opinion. 02-28-2022 upon evaluation today patient appears to be doing well currently in regard to her wound. She has been tolerating the dressing changes. Fortunately I do not see any signs of infection I think the cast is doing an awesome job here. No fevers, chills, nausea, vomiting, or diarrhea. 03-07-2022 upon evaluation today patient appears to be doing well with regard to her wound although the collagen is very stuck and dry on the area on the tip  of her toe at this point. Nonetheless I discussed with her that I am a little reluctant to put the cast on this week due to what appeared to been a blister along the left side of her toe laterally. Nonetheless this has been a little worried about the possibility of infection and I do not see anything obvious I think that we may not want to put the cast on this week just to be on the safe side she voiced understanding. With that being said if were not to use the cast she is going to need to make sure to have still some pressure relief I think making gauze bolster with 3 gauze up underneath the toe is going to do a good job here. We will roll and tape this and then subsequently anchored underneath the big toe should be using the front offloading shoe as well. 7/26; patient I know from a previous stay in this clinic. Type II diabetic with a wound on her right plantar great toe. She was in a total contact cast to 2 weeks ago the wound was close to closed last week according to our intake nurse. We put Xeroform and gauze on this that the patient has been changing her self. She has a forefoot offloading boot. 03-21-2022 upon a variable evaluation today patient appears to be doing somewhat poorly in regard to callus buildup at this time. Fortunately I do not see any evidence of overall worsening but this is just still open. No fevers, chills, nausea, vomiting, or diarrhea. I think we may have to go back into the cast next week if this is not significantly improved by that time. 03-28-2022 upon evaluation today patient's wound is actually showing signs of significant improvement which is great news. The cast over the past week he has done a great job. This is so small however but still has a little bit of a deeper area that I am concerned about the possibility of it drying out too much under the cast that is what we saw today and callused over. Subsequently I did remove the callus. 04-11-2022 upon evaluation today  patient appears to still have a lot of callus buildup today. She was sick with COVID last weeks has been 2 weeks since have seen her. This is what accounts for some of the callus buildup in my opinion. With that being said she tells me she was not nearly as active working from home with COVID as she can go in. Nonetheless I do believe still that based on what we are seeing this is still too much friction is likely occurring around the toe area. 04-18-2022 upon evaluation today patient appears to be doing excellent in regard to her wound in fact this appears to be completely healed which is great news. Fortunately I do not see any evidence of active infection locally or systemically at this time which is excellent as  well. No fevers, chills, nausea, vomiting, or diarrhea. 06/29/2022 Ms. Franz Dell is a 57 year old female with a past medical history of uncontrolled type 2 diabetes that presents to the clinic for a wound to the right foot. On 06/02/2022 she presented to the hospital for cellulitis of the right foot and found to have osteomyelitis of the fifth toe. She had a 5th ray amputation on 10/14. Her culture grew Staph aureus. She was placed on 10 days of linezolid by infectious disease. She has been doing a dry dressing for the past 3-4 weeks. She currently denies systemic signs of infection. 11/17; patient presents for follow-up. She has been using Santyl and silver alginate to the wound bed daily. She reports using Vashe wound cleanser between dressing changes. She is taking doxycycline currently. She has no issues or complaints today. 11/28; patient presents for follow-up. She has been using Santyl and silver alginate to the wound bed without issues. She completed her course of doxycycline. She denies signs of infection. 12/5; patient presents for follow-up. She has been using Santyl and silver alginate to the wound bed. She has no issues or complaints today. She denies signs of  infection. 12/14; patient presents for follow-up. She has been using Santyl and Hydrofera Blue to the wound bed. There is been improvement in wound healing. She denies signs of infection. 12/21; patient presents for follow-up. She has been using Santyl and Hydrofera Blue to the wound bed. There continues to be improvement in wound healing. She has no issues or complaints today. Patient History Information obtained from Patient. Family History Cancer - Father, Diabetes - Mother, Heart Disease - Father,Mother. Social History Never smoker, Marital Status - Single, Alcohol Use - Never, Drug Use - No History, Caffeine Use - Daily. Medical History Cardiovascular Patient has history of Hypertension Endocrine ANNAZETTE, GAMBINO ([id number]) [id number] Page 8 of 10 Patient has history of Type II Diabetes Neurologic Patient has history of Neuropathy Hospitalization/Surgery History - right exicision haglund's deformity wih achilles tendon repair 2020. 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 Medical A Surgical History Notes nd Cardiovascular Hyperlipidemia Objective Constitutional respirations regular, non-labored and within target range for patient.. Vitals Time Taken: 8:15 AM, Temperature: 98 F, Pulse: 68 bpm, Respiratory Rate: 20 breaths/min, Blood Pressure: 132/84 mmHg. Cardiovascular 2+ dorsalis pedis/posterior tibialis pulses. Psychiatric pleasant and cooperative. General Notes: Right foot: T the lateral/dorsal aspect there is an open wound with granulation tissue and nonviable tissue. No signs of infection including o increased warmth, erythema or purulent drainage. Integumentary (Hair, Skin) Wound #3 status is Open. Original cause of wound was Surgical Injury. The date acquired was: 05/21/2022. The wound has been in treatment 5 weeks. The wound is located on the Right Amputation Site - T The wound measures 0.2cm length x 0.2cm width x  0.1cm depth; 0.031cm^2 area and 0.003cm^3 volume. oe. There is Fat Layer (Subcutaneous Tissue) exposed. There is no tunneling or undermining noted. There is a medium amount of serosanguineous drainage noted. The wound margin is distinct with the outline attached to the wound base. There is large (67-100%) pink, pale granulation within the wound bed. There is no necrotic tissue within the wound bed. The periwound skin appearance exhibited: Callus. The periwound skin appearance did not exhibit: Crepitus, Excoriation, Induration, Rash, Scarring, Dry/Scaly, Maceration, Atrophie Blanche, Cyanosis, Ecchymosis, Hemosiderin Staining, Mottled, Pallor, Rubor, Erythema. Periwound temperature was noted as No Abnormality. Assessment Active Problems ICD-10 Unspecified open wound, right foot, initial encounter Type 2 diabetes mellitus with foot  ulcer Gangrene, not elsewhere classified Osteomyelitis, unspecified Patient's wound has shown improvement in size and appearance since last clinic visit. I debrided nonviable tissue. I recommended stopping Santyl and continuing with Hydrofera Blue. Follow-up in 1 week. Continue aggressive offloading. Procedures Wound #3 Pre-procedure diagnosis of Wound #3 is a Diabetic Wound/Ulcer of the Lower Extremity located on the Right Amputation Site - T .Severity of Tissue Pre oe Debridement is: Fat layer exposed. There was a Excisional Skin/Subcutaneous Tissue Debridement with a total area of 0.04 sq cm performed by Kalman Shan, DO. With the following instrument(s): Curette Material removed includes Callus, Subcutaneous Tissue, and Slough after achieving pain control using Lidocaine. No specimens were taken. A time out was conducted at 08:50, prior to the start of the procedure. A Minimum amount of bleeding was controlled with Pressure. The procedure was tolerated well with a pain level of 0 throughout and a pain level of 0 following the procedure. Post Debridement  Measurements: 0.2cm length x 0.2cm width x 0.1cm depth; 0.003cm^3 volume. Character of Wound/Ulcer Post Debridement is improved. Severity of Tissue Post Debridement is: Fat layer exposed. Post procedure Diagnosis Wound #3: Same as Pre-Procedure Plan LECRETIA, BUCZEK R ([id number]) [id number] Page 9 of 10 Follow-up Appointments: Return Appointment in 1 week. - Dr. Heber [place] (already has appt.) Return Appointment in 2 weeks. - Dr. Heber [place] Anesthetic: (In clinic) Topical Lidocaine 4% applied to wound bed Bathing/ Shower/ Hygiene: May shower with protection but do not get wound dressing(s) wet. Edema Control - Lymphedema / SCD / Other: Elevate legs to the level of the heart or above for 30 minutes daily and/or when sitting, a frequency of: - throughout the day. Avoid standing for long periods of time. Off-Loading: Open toe surgical shoe to: - office to provide a new surgical shoe. ensure no pressure to right lateral foot. WOUND #3: - Amputation Site - T oe Wound Laterality: Right Cleanser: Vashe 1 x Per Day/30 Days Discharge Instructions: use to cleanse wound daily. Prim Dressing: Hydrofera Blue Ready Transfer Foam, 2.5x2.5 (in/in) 1 x Per Day/30 Days ary Discharge Instructions: Apply directly to wound bed as directed Secondary Dressing: Woven Gauze Sponge, Non-Sterile 4x4 in (Generic) 1 x Per Day/30 Days Discharge Instructions: Apply over primary dressing as directed. Secured With: Child psychotherapist, Sterile 2x75 (in/in) (Generic) 1 x Per Day/30 Days Discharge Instructions: Secure with stretch gauze as directed. Secured With: 75M Medipore H Soft Cloth Surgical T ape, 4 x 10 (in/yd) (Generic) 1 x Per Day/30 Days Discharge Instructions: Secure with tape as directed. 1. In office sharp debridement 2. Hydrofera Blue 3. Offloadingoosurgical shoe Electronic Signature(s) Signed: 08/09/2022 11:15:03 AM By: Kalman Shan DO Entered By: Kalman Shan on 08/09/2022 08:59:27 -------------------------------------------------------------------------------- HxROS Details Patient Name: Date of Service: Donna [name]. 08/09/2022 8:00 A M Medical Record Number: [id number] Patient Account Number: [id number] Date of Birth/Sex: Treating RN: 08-21-1965 (57 y.o. F) Primary Care Provider: Pricilla Holm Other Clinician: Referring Provider: Treating Provider/Extender: Verlene Mayer in Treatment: 5 Information Obtained From Patient Cardiovascular Medical History: Positive for: Hypertension Past Medical History Notes: Hyperlipidemia Endocrine Medical History: Positive for: Type II Diabetes Treated with: Insulin, Oral agents Blood sugar tested every day: No Neurologic Medical History: Positive for: Neuropathy Immunizations Pneumococcal Vaccine: Received Pneumococcal Vaccination: No AUBREANNA, PERCLE R ([id number]) [phone].pdf Page 10 of 10 Implantable Devices None Hospitalization / Surgery History Type of Hospitalization/Surgery right exicision haglund's deformity wih achilles tendon repair 2020 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 Family and  Social History Cancer: Yes - Father; Diabetes: Yes - Mother; Heart Disease: Yes - Father,Mother; Never smoker; Marital Status - Single; Alcohol Use: Never; Drug Use: No History; Caffeine Use: Daily; Financial Concerns: No; Food, Clothing or Shelter Needs: No; Support System Lacking: No; Transportation Concerns: No Electronic Signature(s) Signed: 08/09/2022 11:15:03 AM By: Kalman Shan DO Entered By: Kalman Shan on 08/09/2022 08:57:41 -------------------------------------------------------------------------------- SuperBill Details Patient Name: Date of Service: Donna [name] 08/09/2022 Medical Record Number: [id number] Patient Account Number: [id number] Date of Birth/Sex: Treating  RN: [birth year] (57 y.o. [name], [name] Primary Care Provider: Pricilla Holm Other Clinician: Referring Provider: Treating Provider/Extender: Verlene Mayer in Treatment: 5 Diagnosis Coding ICD-10 Codes Code Description [id number] Unspecified open wound, right foot, initial encounter E11.621 Type 2 diabetes mellitus with foot ulcer I96 Gangrene, not elsewhere classified M86.9 Osteomyelitis, unspecified Facility Procedures : CPT4 Code: [id number] Description: [place] TISSUE 20 SQ CM/< ICD-10 Diagnosis Description S91.301A Unspecified open wound, right foot, initial encounter E11.621 Type 2 diabetes mellitus with foot ulcer Modifier: Quantity: 1 Physician Procedures : CPT4 Code Description Modifier [id number] - WC PHYS SUBQ TISS 20 SQ CM ICD-10 Diagnosis Description S91.301A Unspecified open wound, right foot, initial encounter E11.621 Type 2 diabetes mellitus with foot ulcer Quantity: 1 Electronic Signature(s) Signed: 08/09/2022 11:15:03 AM By: Kalman Shan DO Entered By: Kalman Shan on 08/09/2022 08:59:45

## 2022-08-24 NOTE — Progress Notes (Signed)
Donna, [name] ([id number]) [id number] Page 1 of 8 Visit Report for 08/23/2022 Arrival Information Details Patient Name: Date of Service: Donna, [name] 08/23/2022 8:00 A M Medical Record Number: [id number] Patient Account Number: [id number] Date of Birth/Sex: Treating RN: 07/13/1966 (57 y.o. [name], [name] Primary Care Kano Heckmann: Pricilla Holm Other Clinician: Referring Ras Kollman: Treating Jatniel Verastegui/Extender: Verlene Mayer in Treatment: 7 Visit Information History Since Last Visit Added or deleted any medications: No Patient Arrived: Ambulatory Any new allergies or adverse reactions: No Arrival Time: 08:20 Had a fall or experienced change in No Accompanied By: self activities of daily living that may affect Transfer Assistance: None risk of falls: Patient Identification Verified: Yes Signs or symptoms of abuse/neglect since last visito No Secondary Verification Process Completed: Yes Hospitalized since last visit: No Patient Requires Transmission-Based Precautions: No Implantable device outside of the clinic excluding No Patient Has Alerts: Yes cellular tissue based products placed in the center Patient Alerts: ABI's: [name]:1.16 11/2021 since last visit: Has Dressing in Place as Prescribed: Yes Pain Present Now: No Electronic Signature(s) Signed: 08/24/2022 12:14:03 PM By: [name] RN Entered By: [name] on 08/23/2022 08:20:40 -------------------------------------------------------------------------------- Clinic Level of Care Assessment Details Patient Name: Date of Service: Donna, [name] 08/23/2022 8:00 A M Medical Record Number: [id number] Patient Account Number: [id number] Date of Birth/Sex: Treating RN: [birth year] (57 y.o. [name], [name] Primary Care Sherrie Marsan: Pricilla Holm Other Clinician: Referring Lillan Mccreadie: Treating Israel Wunder/Extender: Verlene Mayer in Treatment: 7 Clinic Level of Care Assessment Items TOOL 4 Quantity Score X- 1 0 Use when only an EandM is performed on FOLLOW-UP visit ASSESSMENTS - Nursing Assessment / Reassessment X- 1 10 Reassessment of Co-morbidities (includes updates in patient status) X- 1 5 Reassessment of Adherence to Treatment Plan ASSESSMENTS - Wound and Skin A ssessment / Reassessment X - Simple Wound Assessment / Reassessment - one wound 1 5 []  - 0 Complex Wound Assessment / Reassessment - multiple wounds []  - 0 Dermatologic / Skin Assessment (not related to wound area) ASSESSMENTS - Focused Assessment X- 1 5 Circumferential Edema Measurements - multi extremities []  - 0 Nutritional Assessment / Counseling / Intervention Donna, [name] ([id number]) [id number] Page 2 of 8 []  - 0 Lower Extremity Assessment (monofilament, tuning fork, pulses) []  - 0 Peripheral Arterial Disease Assessment (using hand held doppler) ASSESSMENTS - Ostomy and/or Continence Assessment and Care []  - 0 Incontinence Assessment and Management []  - 0 Ostomy Care Assessment and Management (repouching, etc.) PROCESS - Coordination of Care X - Simple Patient / Family Education for ongoing care 1 15 []  - 0 Complex (extensive) Patient / Family Education for ongoing care X- 1 10 Staff obtains Programmer, systems, Records, T Results / Process Orders est []  - 0 Staff telephones HHA, Nursing Homes / Clarify orders / etc []  - 0 Routine Transfer to another Facility (non-emergent condition) []  - 0 Routine Hospital Admission (non-emergent condition) []  - 0 New Admissions / Biomedical engineer / Ordering NPWT Apligraf, etc. , []  - 0 Emergency Hospital Admission (emergent condition) X- 1 10 Simple Discharge Coordination []  - 0 Complex (extensive) Discharge Coordination PROCESS - Special Needs []  - 0 Pediatric / Minor Patient Management []  - 0 Isolation Patient Management []  -  0 Hearing / Language / Visual special needs []  - 0 Assessment of Community assistance (transportation, D/C planning, etc.) []  - 0 Additional assistance / Altered mentation []  - 0 Support Surface(s) Assessment (bed, cushion, seat, etc.) INTERVENTIONS - Wound Cleansing / Measurement X -  Simple Wound Cleansing - one wound 1 5 []  - 0 Complex Wound Cleansing - multiple wounds X- 1 5 Wound Imaging (photographs - any number of wounds) []  - 0 Wound Tracing (instead of photographs) X- 1 5 Simple Wound Measurement - one wound []  - 0 Complex Wound Measurement - multiple wounds INTERVENTIONS - Wound Dressings X - Small Wound Dressing one or multiple wounds 1 10 []  - 0 Medium Wound Dressing one or multiple wounds []  - 0 Large Wound Dressing one or multiple wounds []  - 0 Application of Medications - topical []  - 0 Application of Medications - injection INTERVENTIONS - Miscellaneous []  - 0 External ear exam []  - 0 Specimen Collection (cultures, biopsies, blood, body fluids, etc.) []  - 0 Specimen(s) / Culture(s) sent or taken to Lab for analysis []  - 0 Patient Transfer (multiple staff / / Similar devices) []  - 0 Simple Staple / Suture removal (25 or less) []  - 0 Complex Staple / Suture removal (26 or more) []  - 0 Hypo / Hyperglycemic Management (close monitor of Blood Glucose) Donna, Lodie [name] (  .pdf Page 3 of 8 []  - 0 Ankle / Brachial Index (ABI) - do not check if billed separately X- 1 5 Vital Signs Has the patient been seen at the hospital within the last three years: Yes Total Score: 90 Level Of Care: New/Established - Level 3 Electronic Signature(s) Signed: 08/24/2022 12:14:03 PM By: RN Entered By: on 08/23/2022 09:09:23 -------------------------------------------------------------------------------- Encounter Discharge Information Details Patient Name: Date of Service: . 08/23/2022 8:00 A M Medical Record Number: Patient Account Number: Date of Birth/Sex: Treating RN: March 30, 1966 (57 y.o. , [name] Primary Care Kristyanna Barcelo: Other Clinician: Referring Tanajah Boulter: Treating Jaiyden Laur/Extender: [id number] in Treatment: 7 Encounter Discharge Information Items Discharge Condition: Stable Ambulatory Status: Ambulatory Discharge Destination: Home Transportation: Private Auto Accompanied By: self Schedule Follow-up Appointment: Yes Clinical Summary of Care: Patient Declined Electronic Signature(s) Signed: 08/24/2022 12:14:03 PM By: RN Entered By: 10/23/2022 on 08/23/2022 09:09:54 -------------------------------------------------------------------------------- Lower Extremity Assessment Details Patient Name: Date of Service: Donna [name]. 08/23/2022 8:00 A M Medical Record Number: Donna Bollman Patient Account Number: 10/22/2022 Date of Birth/Sex: Treating RN: 13-Jun-1966 (57 y.o. 13/12/1965, [name] Primary Care Anayansi Rundquist: 57 Other Clinician: Referring Cricket Goodlin: Treating Rickayla Wieland/Extender: Ardis Rowan in Treatment: 7 Edema Assessment Assessed: [Left: No] [Right: Yes] Edema: [Left: N] [Right: o] Calf Left: Right: Point of Measurement: 31 cm From Medial Instep 37 cm Ankle Left: Right: Point of Measurement: 11 cm From Medial Instep 22 cm Vascular Assessment BLAKLEIGH, STRAW [name] (Leandrew Koyanagi) [Right:[id number] Page 4 of 8] Pulses: Dorsalis Pedis Palpable: [Right:Yes] Posterior Tibial Palpable: [Right:Yes] Electronic Signature(s) Signed: 08/24/2022 12:14:03 PM By: Donna Mu RN Entered By: Donna [name] on 08/23/2022 08:21:50 -------------------------------------------------------------------------------- Multi Wound Chart Details Patient Name: Date of  Service: Donna Bollman. 08/23/2022 8:00 A M Medical Record Number: [id number] Patient Account Number: [id number] Date of Birth/Sex: Treating RN: 11/14/1965 (57 y.o. F) Primary Care Chritopher Coster: Ardis Rowan Other Clinician: Referring Toshiyuki Fredell: Treating Loretta Doutt/Extender: Hillard Danker in Treatment: 7 Vital Signs Height(in): Pulse(bpm): 74 Weight(lbs): Blood Pressure(mmHg): 148/67 Body Mass Index(BMI): Temperature(F): 98.7 Respiratory Rate(breaths/min): 17 [3:Photos:] [N/A:N/A] Right Amputation Site - T oe N/A N/A Wound Location: Surgical Injury N/A N/A Wounding Event: Diabetic Wound/Ulcer of the Lower N/A N/A Primary Etiology: Extremity Open Surgical Wound N/A N/A Secondary Etiology: Hypertension, Type II Diabetes, N/A N/A Comorbid  History: Neuropathy 05/21/2022 N/A N/A Date Acquired: 7 N/A N/A Weeks of Treatment: Open N/A N/A Wound Status: No N/A N/A Wound Recurrence: 0x0x0 N/A N/A Measurements L x W x D (cm) 0 N/A N/A A (cm) : rea 0 N/A N/A Volume (cm) : 100.00% N/A N/A % Reduction in A rea: 100.00% N/A N/A % Reduction in Volume: Grade 4 N/A N/A Classification: None Present N/A N/A Exudate A mount: Distinct, outline attached N/A N/A Wound Margin: None Present (0%) N/A N/A Granulation A mount: None Present (0%) N/A N/A Necrotic A mount: Fascia: No N/A N/A Exposed Structures: Fat Layer (Subcutaneous Tissue): No Tendon: No Muscle: No Joint: No Bone: No Large (67-100%) N/A N/A Epithelialization: Excoriation: No N/A N/A Periwound Skin Texture: Induration: No Callus: No Crepitus: No Rash: No Donna, [name] [name] ([id number]) [id number] Page 5 of 8 Scarring: No Maceration: No N/A N/A Periwound Skin Moisture: Dry/Scaly: No Atrophie Blanche: No N/A N/A Periwound Skin Color: Cyanosis: No Ecchymosis: No Erythema: No Hemosiderin Staining: No Mottled: No Pallor: No Rubor: No No  Abnormality N/A N/A Temperature: Treatment Notes Wound #3 (Amputation Site - Toe) Wound Laterality: Right Cleanser Peri-Wound Care Topical Primary Dressing Secondary Dressing Secured With Compression Wrap Compression Stockings Add-Ons Electronic Signature(s) Signed: 08/23/2022 9:29:55 AM By: Kalman Shan DO Entered By: Kalman Shan on 08/23/2022 09:27:26 -------------------------------------------------------------------------------- Multi-Disciplinary Care Plan Details Patient Name: Date of Service: Donna [name]. 08/23/2022 8:00 A M Medical Record Number: [id number] Patient Account Number: [id number] Date of Birth/Sex: Treating RN: 16-Oct-1965 (57 y.o. [name], [name] Primary Care Kemauri Musa: Pricilla Holm Other Clinician: Referring Quay Simkin: Treating Lavance Beazer/Extender: Verlene Mayer in Treatment: 7 Active Inactive Electronic Signature(s) Signed: 08/24/2022 12:14:03 PM By: [name] RN Entered By: [name] on 08/23/2022 09:08:22 -------------------------------------------------------------------------------- Pain Assessment Details Patient Name: Date of Service: Donna [name] 08/23/2022 8:00 A M Medical Record Number: [id number] Patient Account Number: [id number] Date of Birth/Sex: Treating RN: September 15, 1965 (57 y.o. Benjaman Lobe Primary Care Kwabena Strutz: Pricilla Holm Other Clinician: Ferman [name] ([id number]) [id number] Page 6 of 8 Referring Danna Sewell: Treating Shamona Wirtz/Extender: Verlene Mayer in Treatment: 7 Active Problems Location of Pain Severity and Description of Pain Patient Has Paino No Site Locations Pain Management and Medication Current Pain Management: Electronic Signature(s) Signed: 08/24/2022 12:14:03 PM By: [name] RN Entered By: [name] on 08/23/2022  08:21:36 -------------------------------------------------------------------------------- Patient/Caregiver Education Details Patient Name: Date of Service: Donna [name], Donna [name]. 1/4/2024andnbsp8:00 A M Medical Record Number: [id number] Patient Account Number: [id number] Date of Birth/Gender: Treating RN: [birth year] (57 y.o. [name], [name] Primary Care Physician: Pricilla Holm Other Clinician: Referring Physician: Treating Physician/Extender: Verlene Mayer in Treatment: 7 Education Assessment Education Provided To: Patient Education Topics Provided Wound/Skin Impairment: Methods: Explain/Verbal Responses: Reinforcements needed, State content correctly Electronic Signature(s) Signed: 08/24/2022 12:14:03 PM By: [name] RN Entered By: [name] on 08/23/2022 09:08:45 Donna [name] ([id number]) [id number] Page 7 of 8 -------------------------------------------------------------------------------- Wound Assessment Details Patient Name: Date of Service: Donna, [name] 08/23/2022 8:00 A M Medical Record Number: [id number] Patient Account Number: [id number] Date of Birth/Sex: Treating RN: 1966-02-25 (57 y.o. [name] Primary Care Shikira Folino: Pricilla Holm Other Clinician: Referring Dreanna Kyllo: Treating Rebeckah Masih/Extender: Verlene Mayer in Treatment: 7 Wound Status Wound Number: 3 Primary Etiology: Diabetic Wound/Ulcer of the Lower Extremity Wound Location: Right Amputation Site - Toe Secondary Etiology: Open Surgical Wound Wounding Event: Surgical Injury Wound Status: Open Date Acquired: 05/21/2022 Comorbid History: Hypertension, Type II Diabetes, Neuropathy Weeks  Of Treatment: 7 Clustered Wound: No Photos Wound Measurements Length: (cm) Width: (cm) Depth: (cm) Area: (cm) Volume: (cm) 0 % Reduction in Area: 100% 0 % Reduction in Volume: 100% 0  Epithelialization: Large (67-100%) 0 Tunneling: No 0 Undermining: No Wound Description Classification: Grade 4 Wound Margin: Distinct, outline attache Exudate Amount: None Present Foul Odor After Cleansing: No d Slough/Fibrino No Wound Bed Granulation Amount: None Present (0%) Exposed Structure Necrotic Amount: None Present (0%) Fascia Exposed: No Fat Layer (Subcutaneous Tissue) Exposed: No Tendon Exposed: No Muscle Exposed: No Joint Exposed: No Bone Exposed: No Periwound Skin Texture Texture Color No Abnormalities Noted: No No Abnormalities Noted: No Callus: No Atrophie Blanche: No Crepitus: No Cyanosis: No Excoriation: No Ecchymosis: No Induration: No Erythema: No Rash: No Hemosiderin Staining: No Scarring: No Mottled: No Pallor: No Moisture Rubor: No No Abnormalities Noted: No Dry / Scaly: No Temperature / Pain Maceration: No Temperature: No Abnormality Electronic Signature(sEDNAMAE, SCHIANO [name] ([id number]) [id number] Page 8 of 8 Signed: 08/23/2022 6:11:27 PM By: Shawn Stall RN, BSN Entered By: Shawn Stall on 08/23/2022 08:21:09 -------------------------------------------------------------------------------- Vitals Details Patient Name: Date of Service: Donna Bollman. 08/23/2022 8:00 A M Medical Record Number: [id number] Patient Account Number: [id number] Date of Birth/Sex: Treating RN: 1966/02/28 (57 y.o. Ardis Rowan, [name] Primary Care Audi Conover: Hillard Danker Other Clinician: Referring Sargon Scouten: Treating Mineola Duan/Extender: Leandrew Koyanagi in Treatment: 7 Vital Signs Time Taken: 08:21 Temperature (F): 98.7 Pulse (bpm): 74 Respiratory Rate (breaths/min): 17 Blood Pressure (mmHg): 148/67 Reference Range: 80 - 120 mg / dl Electronic Signature(s) Signed: 08/24/2022 12:14:03 PM By: Donna Mu RN Entered By: Donna [name] on 08/23/2022 08:21:08

## 2022-08-27 ENCOUNTER — Other Ambulatory Visit: Payer: Self-pay | Admitting: Internal Medicine

## 2022-08-28 ENCOUNTER — Other Ambulatory Visit (HOSPITAL_COMMUNITY): Payer: Self-pay

## 2022-08-28 ENCOUNTER — Other Ambulatory Visit: Payer: Self-pay

## 2022-08-28 DIAGNOSIS — L02611 Cutaneous abscess of right foot: Secondary | ICD-10-CM | POA: Diagnosis not present

## 2022-08-28 DIAGNOSIS — E1140 Type 2 diabetes mellitus with diabetic neuropathy, unspecified: Secondary | ICD-10-CM | POA: Diagnosis not present

## 2022-08-28 MED ORDER — TRESIBA FLEXTOUCH 200 UNIT/ML ~~LOC~~ SOPN
110.0000 [IU] | PEN_INJECTOR | Freq: Every day | SUBCUTANEOUS | 0 refills | Status: DC
  Filled 2022-08-28: qty 15, 27d supply, fill #0

## 2022-08-28 MED ORDER — LISINOPRIL-HYDROCHLOROTHIAZIDE 20-25 MG PO TABS
1.0000 | ORAL_TABLET | Freq: Every day | ORAL | 2 refills | Status: DC
  Filled 2022-08-28: qty 90, 90d supply, fill #0
  Filled 2022-11-26: qty 90, 90d supply, fill #1
  Filled 2023-03-06: qty 90, 90d supply, fill #2

## 2022-08-29 DIAGNOSIS — M50322 Other cervical disc degeneration at C5-C6 level: Secondary | ICD-10-CM | POA: Diagnosis not present

## 2022-08-29 DIAGNOSIS — M9901 Segmental and somatic dysfunction of cervical region: Secondary | ICD-10-CM | POA: Diagnosis not present

## 2022-08-29 DIAGNOSIS — M5137 Other intervertebral disc degeneration, lumbosacral region: Secondary | ICD-10-CM | POA: Diagnosis not present

## 2022-08-29 DIAGNOSIS — M9903 Segmental and somatic dysfunction of lumbar region: Secondary | ICD-10-CM | POA: Diagnosis not present

## 2022-08-30 ENCOUNTER — Ambulatory Visit: Payer: Commercial Managed Care - PPO | Admitting: Podiatry

## 2022-08-30 ENCOUNTER — Encounter (HOSPITAL_BASED_OUTPATIENT_CLINIC_OR_DEPARTMENT_OTHER): Payer: Commercial Managed Care - PPO | Admitting: Internal Medicine

## 2022-08-30 DIAGNOSIS — M5137 Other intervertebral disc degeneration, lumbosacral region: Secondary | ICD-10-CM | POA: Diagnosis not present

## 2022-08-30 DIAGNOSIS — M9901 Segmental and somatic dysfunction of cervical region: Secondary | ICD-10-CM | POA: Diagnosis not present

## 2022-08-30 DIAGNOSIS — M50322 Other cervical disc degeneration at C5-C6 level: Secondary | ICD-10-CM | POA: Diagnosis not present

## 2022-08-30 DIAGNOSIS — M9903 Segmental and somatic dysfunction of lumbar region: Secondary | ICD-10-CM | POA: Diagnosis not present

## 2022-08-31 ENCOUNTER — Other Ambulatory Visit (HOSPITAL_COMMUNITY): Payer: Self-pay

## 2022-09-03 DIAGNOSIS — M5137 Other intervertebral disc degeneration, lumbosacral region: Secondary | ICD-10-CM | POA: Diagnosis not present

## 2022-09-03 DIAGNOSIS — M9903 Segmental and somatic dysfunction of lumbar region: Secondary | ICD-10-CM | POA: Diagnosis not present

## 2022-09-03 DIAGNOSIS — M50322 Other cervical disc degeneration at C5-C6 level: Secondary | ICD-10-CM | POA: Diagnosis not present

## 2022-09-03 DIAGNOSIS — M9901 Segmental and somatic dysfunction of cervical region: Secondary | ICD-10-CM | POA: Diagnosis not present

## 2022-09-05 DIAGNOSIS — M9903 Segmental and somatic dysfunction of lumbar region: Secondary | ICD-10-CM | POA: Diagnosis not present

## 2022-09-05 DIAGNOSIS — M9901 Segmental and somatic dysfunction of cervical region: Secondary | ICD-10-CM | POA: Diagnosis not present

## 2022-09-05 DIAGNOSIS — M5137 Other intervertebral disc degeneration, lumbosacral region: Secondary | ICD-10-CM | POA: Diagnosis not present

## 2022-09-05 DIAGNOSIS — M50322 Other cervical disc degeneration at C5-C6 level: Secondary | ICD-10-CM | POA: Diagnosis not present

## 2022-09-11 ENCOUNTER — Ambulatory Visit (INDEPENDENT_AMBULATORY_CARE_PROVIDER_SITE_OTHER): Payer: Commercial Managed Care - PPO | Admitting: Podiatry

## 2022-09-11 DIAGNOSIS — E119 Type 2 diabetes mellitus without complications: Secondary | ICD-10-CM

## 2022-09-11 DIAGNOSIS — L84 Corns and callosities: Secondary | ICD-10-CM | POA: Diagnosis not present

## 2022-09-11 DIAGNOSIS — Z89421 Acquired absence of other right toe(s): Secondary | ICD-10-CM | POA: Diagnosis not present

## 2022-09-11 NOTE — Progress Notes (Signed)
Subjective:   Patient ID: Donna [name], female   DOB: 57 y.o.   MRN: [id number]   HPI Chief Complaint  Patient presents with   Wound Check    Pt states she is diabetic and she has had several sores come up on right foot. She wants to know what is the best shoes to wear to prevent issues and wants treatment plan for her diabetes    Toe Pain    Her toes on right side bend down further more than the toes on left foot .    57 year old female presents the office today with the above concerns.  She has had several sores on her feet most recently she had to undergo a partial fifth amputation.  She also suggest that she is no areas to help with the issues to her feet.  She does get a callus on the right big toe.  Currently no open lesions or drainage.  Review of Systems  All other systems reviewed and are negative.  Past Medical History:  Diagnosis Date   Diabetes mellitus    type 2- uncontrolled   Hyperlipidemia    Hypertension    Lumbar disc disease    Obesity     Past Surgical History:  Procedure Laterality Date   AMPUTATION TOE Right 06/02/2022   Procedure: RIGHT FIFTH RAY AMPUTATION;  Surgeon: Armond Hang, MD;  Location: [facility];  Service: Orthopedics;  Laterality: Right;   CHOLECYSTECTOMY  '96   Lap   ESI     lumbar spine, spinal injections   EXCISION HAGLUND'S DEFORMITY WITH ACHILLES TENDON REPAIR Right 07/23/2019   Procedure: Right Achilles Tendon Reconstruction, Excision of Haglund Deformity;  Surgeon: Wylene Simmer, MD;  Location: [place];  Service: Orthopedics;  Laterality: Right;   GASTROC RECESSION EXTREMITY Right 07/23/2019   Procedure: Gastroc Recession;  Surgeon: Wylene Simmer, MD;  Location: [place] [facility];  Service: Orthopedics;  Laterality: Right;   REDUCTION MAMMAPLASTY Bilateral    reduction mammoplasty bilaterally       Current Outpatient Medications:    acetaminophen (TYLENOL) 500 MG tablet, Take 1,000 mg by mouth every 6  (six) hours as needed for mild pain or headache., Disp: , Rfl:    atorvastatin (LIPITOR) 80 MG tablet, Take 1 tablet (80 mg total) by mouth daily., Disp: 90 tablet, Rfl: 2   doxycycline (VIBRA-TABS) 100 MG tablet, Take 1 tablet (100 mg total) by mouth 2 (two) times a day for 14 days., Disp: 28 tablet, Rfl: 0   gabapentin (NEURONTIN) 300 MG capsule, TAKE 1 CAPSULE BY MOUTH FOUR TIMES DAILY, Disp: 360 capsule, Rfl: 1   glucose blood (FREESTYLE LITE) test strip, Use as directed to test blood sugar twice daily, Disp: 100 strip, Rfl: 5   insulin degludec (TRESIBA FLEXTOUCH) 200 UNIT/ML FlexTouch Pen, Inject 110 Units into the skin daily. Follow-up appt due in March must see MD for refills, Disp: 15 mL, Rfl: 0   JARDIANCE 25 MG TABS tablet, Take 1 tablet (25 mg total) by mouth daily (needs office visit), Disp: 90 tablet, Rfl: 2   lisinopril-hydrochlorothiazide (ZESTORETIC) 20-25 MG tablet, TAKE 1 TABLET BY MOUTH ONCE A DAY, Disp: 90 tablet, Rfl: 2   metFORMIN (GLUCOPHAGE) 1000 MG tablet, TAKE 1 TABLET BY MOUTH TWICE DAILY (Patient taking differently: Take 1,000 mg by mouth 2 (two) times daily.), Disp: 180 tablet, Rfl: 3   sertraline (ZOLOFT) 100 MG tablet, TAKE 1 TABLET BY MOUTH ONCE DAILY (Patient taking differently: Take 100  mg by mouth daily.), Disp: 90 tablet, Rfl: 3   trimethoprim-polymyxin b (POLYTRIM) ophthalmic solution, Place 1 drop into both eyes every 4 (four) hours., Disp: 10 mL, Rfl: 0  No Known Allergies        Objective:  Physical Exam  General: AAO x3, NAD  Dermatological: Preulcerative callus on the right hallux.  There is no open lesions identified bilaterally.  Upon debridement of the callus on the hallux there is no ongoing ulceration drainage or any signs of infection.  Vascular: Dorsalis Pedis artery and Posterior Tibial artery pedal pulses are 2/4 bilateral with immedate capillary fill time.  There is no pain with calf compression, swelling, warmth, erythema.   Neruologic:  Sensation decreased with Thornell Mule monofilament  Musculoskeletal: Partial fifth ray amputation right side.  Hammertoes present.  Gait: Unassisted, Nonantalgic.       Assessment:   Preulcerative callus right foot history of partial fifth ray amputation     Plan:  -Treatment options discussed including all alternatives, risks, and complications -Etiology of symptoms were discussed -As a courtesy debrided the callus on the hallux without any complications or bleeding.  Discussed moisturizer and offloading.  Discussed different shoes to wear and discussed with shoe with softer toebox with good arch support.  May consider making diabetic inserts or shoes as well.  Discussed the importance of daily foot inspection.  Trula Slade DPM

## 2022-09-12 ENCOUNTER — Other Ambulatory Visit (HOSPITAL_COMMUNITY): Payer: Self-pay

## 2022-09-12 ENCOUNTER — Telehealth: Payer: Commercial Managed Care - PPO | Admitting: Nurse Practitioner

## 2022-09-12 DIAGNOSIS — M9901 Segmental and somatic dysfunction of cervical region: Secondary | ICD-10-CM | POA: Diagnosis not present

## 2022-09-12 DIAGNOSIS — M5137 Other intervertebral disc degeneration, lumbosacral region: Secondary | ICD-10-CM | POA: Diagnosis not present

## 2022-09-12 DIAGNOSIS — M50322 Other cervical disc degeneration at C5-C6 level: Secondary | ICD-10-CM | POA: Diagnosis not present

## 2022-09-12 DIAGNOSIS — H5789 Other specified disorders of eye and adnexa: Secondary | ICD-10-CM

## 2022-09-12 DIAGNOSIS — M9903 Segmental and somatic dysfunction of lumbar region: Secondary | ICD-10-CM | POA: Diagnosis not present

## 2022-09-12 MED ORDER — POLYMYXIN B-TRIMETHOPRIM 10000-0.1 UNIT/ML-% OP SOLN
1.0000 [drp] | OPHTHALMIC | 0 refills | Status: DC
  Filled 2022-09-12: qty 10, 17d supply, fill #0

## 2022-09-12 NOTE — Progress Notes (Signed)
Virtual Visit Consent   Donna [name], you are scheduled for a virtual visit with Donna Hassell Done, FNP, a [facility] provider, today.     Just as with appointments in the office, your consent must be obtained to participate.  Your consent will be active for this visit and any virtual visit you may have with one of our providers in the next 365 days.     If you have a MyChart account, a copy of this consent can be sent to you electronically.  All virtual visits are billed to your insurance company just like a traditional visit in the office.    As this is a virtual visit, video technology does not allow for your provider to perform a traditional examination.  This may limit your provider's ability to fully assess your condition.  If your provider identifies any concerns that need to be evaluated in person or the need to arrange testing (such as labs, EKG, etc.), we will make arrangements to do so.     Although advances in technology are sophisticated, we cannot ensure that it will always work on either your end or our end.  If the connection with a video visit is poor, the visit may have to be switched to a telephone visit.  With either a video or telephone visit, we are not always able to ensure that we have a secure connection.     I need to obtain your verbal consent now.   Are you willing to proceed with your visit today? YES   Donna [name] has provided verbal consent on 09/12/2022 for a virtual visit (video or telephone).   Donna Hassell Done, FNP   Date: 09/12/2022 2:20 PM   Virtual Visit via Video Note   I, Donna [name], connected with Donna [name] ([id number], Jan 08, 1966) on 09/12/22 at  2:45 PM EST by a video-enabled telemedicine application and verified that I am speaking with the correct person using two identifiers.  Location: Patient: Virtual Visit Location Patient: Home Provider: Virtual Visit Location Provider: Mobile   I discussed the  limitations of evaluation and management by telemedicine and the availability of in person appointments. The patient expressed understanding and agreed to proceed.    History of Present Illness: Donna [name] is a 57 y.o. who identifies as a female who was assigned female at birth, and is being seen today for red eye.  HPI: Conjunctivitis  The current episode started yesterday. The onset was gradual. The problem occurs continuously. The problem has been gradually worsening. The problem is mild. Associated symptoms include eye itching, eye pain and eye redness. Pertinent negatives include no fever, no decreased vision, no double vision and no eye discharge.    Review of Systems  Constitutional:  Negative for fever.  Eyes:  Positive for pain, redness and itching. Negative for double vision and discharge.    Problems:  Patient Active Problem List   Diagnosis Date Noted   Medication monitoring encounter 06/13/2022   Gangrene of right foot ([place]) 06/12/2022   Cellulitis of right foot 06/03/2022   Osteomyelitis of fifth toe of right foot ([place]) 06/02/2022   Toenail deformity 09/01/2021   Mouth ulcer 02/28/2021   Right leg pain 06/15/2020   Pre-ulcerative calluses 06/09/2020   Posterior calcaneal exostosis 07/13/2019   Diabetic neuropathy ([place]) 12/01/2014   Routine general medical examination at a health care facility 07/18/2013   Restless leg syndrome 06/02/2012   Diabetes mellitus type 2 with complications ([place]) 34/19/6222  Hyperlipidemia associated with type 2 diabetes mellitus ([place]) 08/22/2010   Morbid obesity ([place]) 08/22/2010   Depression 08/22/2010   Essential hypertension 08/22/2010   GERD (gastroesophageal reflux disease) 08/22/2010    Allergies: No Known Allergies Medications:  Current Outpatient Medications:    acetaminophen (TYLENOL) 500 MG tablet, Take 1,000 mg by mouth every 6 (six) hours as needed for mild pain or headache., Disp: , Rfl:    atorvastatin (LIPITOR) 80 MG  tablet, Take 1 tablet (80 mg total) by mouth daily., Disp: 90 tablet, Rfl: 2   doxycycline (VIBRA-TABS) 100 MG tablet, Take 1 tablet (100 mg total) by mouth 2 (two) times a day for 14 days., Disp: 28 tablet, Rfl: 0   gabapentin (NEURONTIN) 300 MG capsule, TAKE 1 CAPSULE BY MOUTH FOUR TIMES DAILY, Disp: 360 capsule, Rfl: 1   glucose blood (FREESTYLE LITE) test strip, Use as directed to test blood sugar twice daily, Disp: 100 strip, Rfl: 5   insulin degludec (TRESIBA FLEXTOUCH) 200 UNIT/ML FlexTouch Pen, Inject 110 Units into the skin daily. Follow-up appt due in March must see MD for refills, Disp: 15 mL, Rfl: 0   JARDIANCE 25 MG TABS tablet, Take 1 tablet (25 mg total) by mouth daily (needs office visit), Disp: 90 tablet, Rfl: 2   lisinopril-hydrochlorothiazide (ZESTORETIC) 20-25 MG tablet, TAKE 1 TABLET BY MOUTH ONCE A DAY, Disp: 90 tablet, Rfl: 2   metFORMIN (GLUCOPHAGE) 1000 MG tablet, TAKE 1 TABLET BY MOUTH TWICE DAILY (Patient taking differently: Take 1,000 mg by mouth 2 (two) times daily.), Disp: 180 tablet, Rfl: 3   sertraline (ZOLOFT) 100 MG tablet, TAKE 1 TABLET BY MOUTH ONCE DAILY (Patient taking differently: Take 100 mg by mouth daily.), Disp: 90 tablet, Rfl: 3  Observations/Objective: Patient is well-developed, well-nourished in no acute distress.  Resting comfortably  at home.  Head is normocephalic, atraumatic.  No labored breathing.  Speech is clear and coherent with logical content.  Patient is alert and oriented at baseline.  Mild scleral injection left eye  Assessment and Plan:  Donna [name] in today with chief complaint of Conjunctivitis   1. Red eye Avoid rubbing eye Cool compresses Good hand washing  Meds ordered this encounter  Medications   trimethoprim-polymyxin b (POLYTRIM) ophthalmic solution    Sig: Place 1 drop into both eyes every 4 (four) hours.    Dispense:  10 mL    Refill:  0    Order Specific Question:   Supervising Provider    Answer:    Chase Picket [id number]       Follow Up Instructions: I discussed the assessment and treatment plan with the patient. The patient was provided an opportunity to ask questions and all were answered. The patient agreed with the plan and demonstrated an understanding of the instructions.  A copy of instructions were sent to the patient via MyChart.  The patient was advised to call back or seek an in-person evaluation if the symptoms worsen or if the condition fails to improve as anticipated.  Time:  I spent 10 minutes with the patient via telehealth technology discussing the above problems/concerns.    Donna Hassell Done, FNP

## 2022-09-12 NOTE — Patient Instructions (Signed)
Donna [name], thank you for joining Donna Pretty, FNP for today's virtual visit.  While this provider is not your primary care provider (PCP), if your PCP is located in our provider database this encounter information will be shared with them immediately following your visit.   [facility] account gives you access to today's visit and all your visits, tests, and labs performed at [facility] " click here if you don't have a Donna [name] account or go to [email or web address]  Consent: (Patient) Donna [name] provided verbal consent for this virtual visit at the beginning of the encounter.  Current Medications:  Current Outpatient Medications:    trimethoprim-polymyxin b (POLYTRIM) ophthalmic solution, Place 1 drop into both eyes every 4 (four) hours., Disp: 10 mL, Rfl: 0   acetaminophen (TYLENOL) 500 MG tablet, Take 1,000 mg by mouth every 6 (six) hours as needed for mild pain or headache., Disp: , Rfl:    atorvastatin (LIPITOR) 80 MG tablet, Take 1 tablet (80 mg total) by mouth daily., Disp: 90 tablet, Rfl: 2   doxycycline (VIBRA-TABS) 100 MG tablet, Take 1 tablet (100 mg total) by mouth 2 (two) times a day for 14 days., Disp: 28 tablet, Rfl: 0   gabapentin (NEURONTIN) 300 MG capsule, TAKE 1 CAPSULE BY MOUTH FOUR TIMES DAILY, Disp: 360 capsule, Rfl: 1   glucose blood (FREESTYLE LITE) test strip, Use as directed to test blood sugar twice daily, Disp: 100 strip, Rfl: 5   insulin degludec (TRESIBA FLEXTOUCH) 200 UNIT/ML FlexTouch Pen, Inject 110 Units into the skin daily. Follow-up appt due in March must see MD for refills, Disp: 15 mL, Rfl: 0   JARDIANCE 25 MG TABS tablet, Take 1 tablet (25 mg total) by mouth daily (needs office visit), Disp: 90 tablet, Rfl: 2   lisinopril-hydrochlorothiazide (ZESTORETIC) 20-25 MG tablet, TAKE 1 TABLET BY MOUTH ONCE A DAY, Disp: 90 tablet, Rfl: 2   metFORMIN (GLUCOPHAGE) 1000 MG tablet, TAKE 1 TABLET BY MOUTH TWICE  DAILY (Patient taking differently: Take 1,000 mg by mouth 2 (two) times daily.), Disp: 180 tablet, Rfl: 3   sertraline (ZOLOFT) 100 MG tablet, TAKE 1 TABLET BY MOUTH ONCE DAILY (Patient taking differently: Take 100 mg by mouth daily.), Disp: 90 tablet, Rfl: 3   Medications ordered in this encounter:  Meds ordered this encounter  Medications   trimethoprim-polymyxin b (POLYTRIM) ophthalmic solution    Sig: Place 1 drop into both eyes every 4 (four) hours.    Dispense:  10 mL    Refill:  0    Order Specific Question:   Supervising Provider    Answer:   Donna [name] [[id number]]     *If you need refills on other medications prior to your next appointment, please contact your pharmacy*  Follow-Up: Call back or seek an in-person evaluation if the symptoms worsen or if the condition fails to improve as anticipated.  [place] [phone]  Other Instructions Avoid rubbing eye Cool compresses' Good handwashing   If you have been instructed to have an in-person evaluation today at a local Urgent Care facility, please use the link below. It will take you to a list of all of our available [facility], including address, phone number and hours of operation. Please do not delay care.  [facility]  If you or a family member do not have a primary care provider, use the link below to schedule a visit and establish care. When  you choose a [facility] primary care physician or advanced practice provider, you gain a long-term partner in health. Find a Primary Care Provider  Learn more about [facility]'s in-office and virtual care options: [facility]

## 2022-09-18 DIAGNOSIS — M50322 Other cervical disc degeneration at C5-C6 level: Secondary | ICD-10-CM | POA: Diagnosis not present

## 2022-09-18 DIAGNOSIS — M9901 Segmental and somatic dysfunction of cervical region: Secondary | ICD-10-CM | POA: Diagnosis not present

## 2022-09-18 DIAGNOSIS — M9903 Segmental and somatic dysfunction of lumbar region: Secondary | ICD-10-CM | POA: Diagnosis not present

## 2022-09-18 DIAGNOSIS — M5137 Other intervertebral disc degeneration, lumbosacral region: Secondary | ICD-10-CM | POA: Diagnosis not present

## 2022-09-19 ENCOUNTER — Other Ambulatory Visit (HOSPITAL_COMMUNITY): Payer: Self-pay

## 2022-09-19 MED ORDER — TOBRAMYCIN-DEXAMETHASONE 0.3-0.1 % OP SUSP
OPHTHALMIC | 0 refills | Status: DC
  Filled 2022-09-19: qty 5, 7d supply, fill #0

## 2022-09-20 DIAGNOSIS — M9901 Segmental and somatic dysfunction of cervical region: Secondary | ICD-10-CM | POA: Diagnosis not present

## 2022-09-20 DIAGNOSIS — M50322 Other cervical disc degeneration at C5-C6 level: Secondary | ICD-10-CM | POA: Diagnosis not present

## 2022-09-20 DIAGNOSIS — M9903 Segmental and somatic dysfunction of lumbar region: Secondary | ICD-10-CM | POA: Diagnosis not present

## 2022-09-20 DIAGNOSIS — M5137 Other intervertebral disc degeneration, lumbosacral region: Secondary | ICD-10-CM | POA: Diagnosis not present

## 2022-09-24 DIAGNOSIS — M9901 Segmental and somatic dysfunction of cervical region: Secondary | ICD-10-CM | POA: Diagnosis not present

## 2022-09-24 DIAGNOSIS — M9903 Segmental and somatic dysfunction of lumbar region: Secondary | ICD-10-CM | POA: Diagnosis not present

## 2022-09-24 DIAGNOSIS — M50322 Other cervical disc degeneration at C5-C6 level: Secondary | ICD-10-CM | POA: Diagnosis not present

## 2022-09-24 DIAGNOSIS — M5137 Other intervertebral disc degeneration, lumbosacral region: Secondary | ICD-10-CM | POA: Diagnosis not present

## 2022-09-28 ENCOUNTER — Other Ambulatory Visit (HOSPITAL_COMMUNITY): Payer: Self-pay

## 2022-09-28 ENCOUNTER — Other Ambulatory Visit: Payer: Self-pay | Admitting: Internal Medicine

## 2022-09-28 MED ORDER — TRESIBA FLEXTOUCH 200 UNIT/ML ~~LOC~~ SOPN
110.0000 [IU] | PEN_INJECTOR | Freq: Every day | SUBCUTANEOUS | 0 refills | Status: DC
  Filled 2022-09-28: qty 15, 27d supply, fill #0

## 2022-10-01 ENCOUNTER — Other Ambulatory Visit: Payer: Self-pay | Admitting: Internal Medicine

## 2022-10-01 ENCOUNTER — Other Ambulatory Visit (HOSPITAL_COMMUNITY): Payer: Self-pay

## 2022-10-01 DIAGNOSIS — M9903 Segmental and somatic dysfunction of lumbar region: Secondary | ICD-10-CM | POA: Diagnosis not present

## 2022-10-01 DIAGNOSIS — M5137 Other intervertebral disc degeneration, lumbosacral region: Secondary | ICD-10-CM | POA: Diagnosis not present

## 2022-10-01 DIAGNOSIS — M50322 Other cervical disc degeneration at C5-C6 level: Secondary | ICD-10-CM | POA: Diagnosis not present

## 2022-10-01 DIAGNOSIS — M9901 Segmental and somatic dysfunction of cervical region: Secondary | ICD-10-CM | POA: Diagnosis not present

## 2022-10-02 ENCOUNTER — Other Ambulatory Visit (HOSPITAL_COMMUNITY): Payer: Self-pay

## 2022-10-02 MED ORDER — JARDIANCE 25 MG PO TABS
25.0000 mg | ORAL_TABLET | Freq: Every day | ORAL | 2 refills | Status: DC
  Filled 2022-10-02: qty 90, 90d supply, fill #0
  Filled 2022-12-24: qty 90, 90d supply, fill #1
  Filled 2023-04-05: qty 90, 90d supply, fill #2

## 2022-10-04 ENCOUNTER — Other Ambulatory Visit (HOSPITAL_COMMUNITY): Payer: Self-pay

## 2022-10-09 DIAGNOSIS — M9903 Segmental and somatic dysfunction of lumbar region: Secondary | ICD-10-CM | POA: Diagnosis not present

## 2022-10-09 DIAGNOSIS — M50322 Other cervical disc degeneration at C5-C6 level: Secondary | ICD-10-CM | POA: Diagnosis not present

## 2022-10-09 DIAGNOSIS — M5137 Other intervertebral disc degeneration, lumbosacral region: Secondary | ICD-10-CM | POA: Diagnosis not present

## 2022-10-09 DIAGNOSIS — M9901 Segmental and somatic dysfunction of cervical region: Secondary | ICD-10-CM | POA: Diagnosis not present

## 2022-10-16 ENCOUNTER — Other Ambulatory Visit (HOSPITAL_COMMUNITY): Payer: Self-pay

## 2022-10-16 DIAGNOSIS — M9901 Segmental and somatic dysfunction of cervical region: Secondary | ICD-10-CM | POA: Diagnosis not present

## 2022-10-16 DIAGNOSIS — M5137 Other intervertebral disc degeneration, lumbosacral region: Secondary | ICD-10-CM | POA: Diagnosis not present

## 2022-10-16 DIAGNOSIS — M9903 Segmental and somatic dysfunction of lumbar region: Secondary | ICD-10-CM | POA: Diagnosis not present

## 2022-10-16 DIAGNOSIS — M50322 Other cervical disc degeneration at C5-C6 level: Secondary | ICD-10-CM | POA: Diagnosis not present

## 2022-10-22 DIAGNOSIS — M50322 Other cervical disc degeneration at C5-C6 level: Secondary | ICD-10-CM | POA: Diagnosis not present

## 2022-10-22 DIAGNOSIS — M5137 Other intervertebral disc degeneration, lumbosacral region: Secondary | ICD-10-CM | POA: Diagnosis not present

## 2022-10-22 DIAGNOSIS — M9903 Segmental and somatic dysfunction of lumbar region: Secondary | ICD-10-CM | POA: Diagnosis not present

## 2022-10-22 DIAGNOSIS — M9901 Segmental and somatic dysfunction of cervical region: Secondary | ICD-10-CM | POA: Diagnosis not present

## 2022-10-29 DIAGNOSIS — M50322 Other cervical disc degeneration at C5-C6 level: Secondary | ICD-10-CM | POA: Diagnosis not present

## 2022-10-29 DIAGNOSIS — M9901 Segmental and somatic dysfunction of cervical region: Secondary | ICD-10-CM | POA: Diagnosis not present

## 2022-10-29 DIAGNOSIS — M9903 Segmental and somatic dysfunction of lumbar region: Secondary | ICD-10-CM | POA: Diagnosis not present

## 2022-10-29 DIAGNOSIS — M5137 Other intervertebral disc degeneration, lumbosacral region: Secondary | ICD-10-CM | POA: Diagnosis not present

## 2022-11-05 ENCOUNTER — Other Ambulatory Visit (HOSPITAL_COMMUNITY): Payer: Self-pay

## 2022-11-05 ENCOUNTER — Other Ambulatory Visit: Payer: Self-pay | Admitting: Internal Medicine

## 2022-11-05 MED ORDER — TRESIBA FLEXTOUCH 200 UNIT/ML ~~LOC~~ SOPN
110.0000 [IU] | PEN_INJECTOR | Freq: Every day | SUBCUTANEOUS | 0 refills | Status: DC
  Filled 2022-11-05: qty 15, 27d supply, fill #0

## 2022-11-06 ENCOUNTER — Other Ambulatory Visit (HOSPITAL_COMMUNITY): Payer: Self-pay

## 2022-11-15 ENCOUNTER — Other Ambulatory Visit (HOSPITAL_COMMUNITY): Payer: Self-pay

## 2022-11-21 DIAGNOSIS — H5213 Myopia, bilateral: Secondary | ICD-10-CM | POA: Diagnosis not present

## 2022-11-26 ENCOUNTER — Other Ambulatory Visit (HOSPITAL_COMMUNITY): Payer: Self-pay

## 2022-12-10 ENCOUNTER — Other Ambulatory Visit (HOSPITAL_COMMUNITY): Payer: Self-pay

## 2022-12-10 ENCOUNTER — Ambulatory Visit: Payer: Commercial Managed Care - PPO | Admitting: Podiatry

## 2022-12-10 ENCOUNTER — Other Ambulatory Visit: Payer: Self-pay | Admitting: Internal Medicine

## 2022-12-10 DIAGNOSIS — Z89421 Acquired absence of other right toe(s): Secondary | ICD-10-CM

## 2022-12-10 DIAGNOSIS — L84 Corns and callosities: Secondary | ICD-10-CM | POA: Diagnosis not present

## 2022-12-10 DIAGNOSIS — E1149 Type 2 diabetes mellitus with other diabetic neurological complication: Secondary | ICD-10-CM | POA: Diagnosis not present

## 2022-12-11 ENCOUNTER — Ambulatory Visit: Payer: Commercial Managed Care - PPO | Admitting: Podiatry

## 2022-12-11 ENCOUNTER — Other Ambulatory Visit (HOSPITAL_COMMUNITY): Payer: Self-pay

## 2022-12-11 MED ORDER — TRESIBA FLEXTOUCH 200 UNIT/ML ~~LOC~~ SOPN
110.0000 [IU] | PEN_INJECTOR | Freq: Every day | SUBCUTANEOUS | 0 refills | Status: DC
  Filled 2022-12-11: qty 15, 27d supply, fill #0

## 2022-12-11 NOTE — Progress Notes (Signed)
Subjective: Chief Complaint  Patient presents with   Nail Problem    Thick painful toenails, 3 month follow up   Callouses   57 year old female presents With above concerns.  Her main concern is recurrent callus on the right big toe.  She plans to keep it wrapped to keep pressure off of the area.  She has not seen any swelling, drainage or any signs of infection.  She states she has had a hammertoes ever since she had the Achilles tendon rupture, surgery.  Objective: AAO x3, NAD DP/PT pulses palpable bilaterally, CRT less than 3 seconds Preulcerative callus to the right hallux.  There is no edema, erythema, drainage or pus or any signs of infection.  There is no open lesions bilaterally.  Incision from the prior surgery is well-healed and the partial fifth ray amputation.  Hammertoes are present to right foot.  The nails are not significantly elongated. No pain with calf compression, swelling, warmth, erythema  Assessment: Preulcerative callus right hallux  Plan: -All treatment options discussed with the patient including all alternatives, risks, complications.  -Debrided hyperkeratotic tissue and complications of bleeding.  Continue offloading as well as importance of daily foot inspection.  Continue glucose control. -We discussed trying make an accommodative orthotic to help take pressure off the area as well. -Patient encouraged to call the office with any questions, concerns, change in symptoms.   Vivi Barrack DPM

## 2022-12-12 ENCOUNTER — Other Ambulatory Visit (HOSPITAL_COMMUNITY): Payer: Self-pay

## 2022-12-24 ENCOUNTER — Other Ambulatory Visit (HOSPITAL_COMMUNITY): Payer: Self-pay

## 2023-01-11 ENCOUNTER — Other Ambulatory Visit: Payer: Self-pay | Admitting: Internal Medicine

## 2023-01-11 ENCOUNTER — Other Ambulatory Visit (HOSPITAL_COMMUNITY): Payer: Self-pay

## 2023-01-11 MED ORDER — GABAPENTIN 300 MG PO CAPS
300.0000 mg | ORAL_CAPSULE | Freq: Four times a day (QID) | ORAL | 1 refills | Status: DC
  Filled 2023-01-11: qty 360, 90d supply, fill #0
  Filled 2023-04-25: qty 360, 90d supply, fill #1

## 2023-01-11 MED ORDER — TRESIBA FLEXTOUCH 200 UNIT/ML ~~LOC~~ SOPN
110.0000 [IU] | PEN_INJECTOR | Freq: Every day | SUBCUTANEOUS | 0 refills | Status: DC
  Filled 2023-01-11: qty 15, 27d supply, fill #0

## 2023-02-18 ENCOUNTER — Other Ambulatory Visit: Payer: Self-pay | Admitting: Internal Medicine

## 2023-02-18 ENCOUNTER — Other Ambulatory Visit (HOSPITAL_COMMUNITY): Payer: Self-pay

## 2023-02-19 ENCOUNTER — Other Ambulatory Visit (HOSPITAL_COMMUNITY): Payer: Self-pay

## 2023-02-19 MED ORDER — TRESIBA FLEXTOUCH 200 UNIT/ML ~~LOC~~ SOPN
110.0000 [IU] | PEN_INJECTOR | Freq: Every day | SUBCUTANEOUS | 0 refills | Status: DC
  Filled 2023-02-19: qty 15, 27d supply, fill #0

## 2023-02-20 ENCOUNTER — Other Ambulatory Visit (HOSPITAL_COMMUNITY): Payer: Self-pay

## 2023-03-06 ENCOUNTER — Other Ambulatory Visit (HOSPITAL_COMMUNITY): Payer: Self-pay

## 2023-03-24 ENCOUNTER — Inpatient Hospital Stay (HOSPITAL_COMMUNITY)
Admission: EM | Admit: 2023-03-24 | Discharge: 2023-03-29 | DRG: 617 | Disposition: A | Discharge status: Home / Self Care | Payer: Commercial Managed Care - PPO | Attending: Internal Medicine | Admitting: Internal Medicine

## 2023-03-24 ENCOUNTER — Other Ambulatory Visit: Payer: Self-pay

## 2023-03-24 ENCOUNTER — Inpatient Hospital Stay (HOSPITAL_COMMUNITY): Payer: Commercial Managed Care - PPO

## 2023-03-24 ENCOUNTER — Emergency Department (HOSPITAL_COMMUNITY): Payer: Commercial Managed Care - PPO

## 2023-03-24 ENCOUNTER — Encounter (HOSPITAL_COMMUNITY): Payer: Self-pay

## 2023-03-24 DIAGNOSIS — E876 Hypokalemia: Secondary | ICD-10-CM | POA: Insufficient documentation

## 2023-03-24 DIAGNOSIS — M869 Osteomyelitis, unspecified: Secondary | ICD-10-CM | POA: Diagnosis not present

## 2023-03-24 DIAGNOSIS — Z89421 Acquired absence of other right toe(s): Secondary | ICD-10-CM

## 2023-03-24 DIAGNOSIS — E785 Hyperlipidemia, unspecified: Secondary | ICD-10-CM | POA: Diagnosis present

## 2023-03-24 DIAGNOSIS — K219 Gastro-esophageal reflux disease without esophagitis: Secondary | ICD-10-CM | POA: Diagnosis present

## 2023-03-24 DIAGNOSIS — E1152 Type 2 diabetes mellitus with diabetic peripheral angiopathy with gangrene: Secondary | ICD-10-CM | POA: Diagnosis present

## 2023-03-24 DIAGNOSIS — M86171 Other acute osteomyelitis, right ankle and foot: Secondary | ICD-10-CM | POA: Diagnosis not present

## 2023-03-24 DIAGNOSIS — E1142 Type 2 diabetes mellitus with diabetic polyneuropathy: Secondary | ICD-10-CM | POA: Diagnosis present

## 2023-03-24 DIAGNOSIS — I7779 Dissection of other artery: Secondary | ICD-10-CM | POA: Diagnosis not present

## 2023-03-24 DIAGNOSIS — E669 Obesity, unspecified: Secondary | ICD-10-CM | POA: Diagnosis present

## 2023-03-24 DIAGNOSIS — I700 Atherosclerosis of aorta: Secondary | ICD-10-CM | POA: Diagnosis present

## 2023-03-24 DIAGNOSIS — Z79899 Other long term (current) drug therapy: Secondary | ICD-10-CM

## 2023-03-24 DIAGNOSIS — Z833 Family history of diabetes mellitus: Secondary | ICD-10-CM

## 2023-03-24 DIAGNOSIS — Z8249 Family history of ischemic heart disease and other diseases of the circulatory system: Secondary | ICD-10-CM

## 2023-03-24 DIAGNOSIS — E1169 Type 2 diabetes mellitus with other specified complication: Secondary | ICD-10-CM | POA: Diagnosis not present

## 2023-03-24 DIAGNOSIS — I10 Essential (primary) hypertension: Secondary | ICD-10-CM | POA: Diagnosis present

## 2023-03-24 DIAGNOSIS — Z9049 Acquired absence of other specified parts of digestive tract: Secondary | ICD-10-CM

## 2023-03-24 DIAGNOSIS — F32A Depression, unspecified: Secondary | ICD-10-CM | POA: Diagnosis not present

## 2023-03-24 DIAGNOSIS — Z8619 Personal history of other infectious and parasitic diseases: Secondary | ICD-10-CM | POA: Diagnosis not present

## 2023-03-24 DIAGNOSIS — Z794 Long term (current) use of insulin: Secondary | ICD-10-CM | POA: Diagnosis not present

## 2023-03-24 DIAGNOSIS — Z7984 Long term (current) use of oral hypoglycemic drugs: Secondary | ICD-10-CM

## 2023-03-24 DIAGNOSIS — Z8051 Family history of malignant neoplasm of kidney: Secondary | ICD-10-CM | POA: Diagnosis not present

## 2023-03-24 DIAGNOSIS — M19071 Primary osteoarthritis, right ankle and foot: Secondary | ICD-10-CM | POA: Diagnosis not present

## 2023-03-24 DIAGNOSIS — L03115 Cellulitis of right lower limb: Secondary | ICD-10-CM | POA: Diagnosis not present

## 2023-03-24 DIAGNOSIS — Z803 Family history of malignant neoplasm of breast: Secondary | ICD-10-CM | POA: Diagnosis not present

## 2023-03-24 DIAGNOSIS — L03031 Cellulitis of right toe: Secondary | ICD-10-CM | POA: Diagnosis present

## 2023-03-24 DIAGNOSIS — Z6836 Body mass index (BMI) 36.0-36.9, adult: Secondary | ICD-10-CM

## 2023-03-24 DIAGNOSIS — E1165 Type 2 diabetes mellitus with hyperglycemia: Secondary | ICD-10-CM | POA: Diagnosis not present

## 2023-03-24 DIAGNOSIS — I891 Lymphangitis: Secondary | ICD-10-CM | POA: Diagnosis present

## 2023-03-24 DIAGNOSIS — E118 Type 2 diabetes mellitus with unspecified complications: Secondary | ICD-10-CM | POA: Diagnosis not present

## 2023-03-24 DIAGNOSIS — B9561 Methicillin susceptible Staphylococcus aureus infection as the cause of diseases classified elsewhere: Secondary | ICD-10-CM | POA: Diagnosis not present

## 2023-03-24 DIAGNOSIS — E11628 Type 2 diabetes mellitus with other skin complications: Secondary | ICD-10-CM | POA: Diagnosis present

## 2023-03-24 DIAGNOSIS — M86172 Other acute osteomyelitis, left ankle and foot: Secondary | ICD-10-CM | POA: Diagnosis not present

## 2023-03-24 DIAGNOSIS — M7989 Other specified soft tissue disorders: Secondary | ICD-10-CM | POA: Diagnosis not present

## 2023-03-24 DIAGNOSIS — Z0389 Encounter for observation for other suspected diseases and conditions ruled out: Secondary | ICD-10-CM | POA: Diagnosis not present

## 2023-03-24 LAB — COMPREHENSIVE METABOLIC PANEL
ALT: 27 U/L (ref 0–44)
AST: 17 U/L (ref 15–41)
Albumin: 4.5 g/dL (ref 3.5–5.0)
Alkaline Phosphatase: 73 U/L (ref 38–126)
Anion gap: 12 (ref 5–15)
BUN: 13 mg/dL (ref 6–20)
CO2: 24 mmol/L (ref 22–32)
Calcium: 9.1 mg/dL (ref 8.9–10.3)
Chloride: 101 mmol/L (ref 98–111)
Creatinine, Ser: 0.59 mg/dL (ref 0.44–1.00)
GFR, Estimated: >60 mL/min (ref >60)
Glucose, Bld: 134 mg/dL — ABNORMAL HIGH (ref 70–99)
Potassium: 3.6 mmol/L (ref 3.5–5.1)
Sodium: 137 mmol/L (ref 135–145)
Total Bilirubin: 1 mg/dL (ref 0.3–1.2)
Total Protein: 8.2 g/dL — ABNORMAL HIGH (ref 6.5–8.1)

## 2023-03-24 LAB — CBC WITH DIFFERENTIAL/PLATELET
Abs Immature Granulocytes: 0.05 10*3/uL (ref 0.00–0.07)
Basophils Absolute: 0 10*3/uL (ref 0.0–0.1)
Basophils Relative: 0 %
Eosinophils Absolute: 0 10*3/uL (ref 0.0–0.5)
Eosinophils Relative: 0 %
HCT: 46.1 % — ABNORMAL HIGH (ref 36.0–46.0)
Hemoglobin: 15.3 g/dL — ABNORMAL HIGH (ref 12.0–15.0)
Immature Granulocytes: 0 %
Lymphocytes Relative: 11 %
Lymphs Abs: 1.2 10*3/uL (ref 0.7–4.0)
MCH: 30.8 pg (ref 26.0–34.0)
MCHC: 33.2 g/dL (ref 30.0–36.0)
MCV: 92.9 fL (ref 80.0–100.0)
Monocytes Absolute: 0.7 10*3/uL (ref 0.1–1.0)
Monocytes Relative: 6 %
Neutro Abs: 9.8 10*3/uL — ABNORMAL HIGH (ref 1.7–7.7)
Neutrophils Relative %: 83 %
Platelets: 256 10*3/uL (ref 150–400)
RBC: 4.96 MIL/uL (ref 3.87–5.11)
RDW: 12.5 % (ref 11.5–15.5)
WBC: 11.8 10*3/uL — ABNORMAL HIGH (ref 4.0–10.5)
nRBC: 0 % (ref 0.0–0.2)

## 2023-03-24 LAB — I-STAT CHEM 8, ED
BUN: 11 mg/dL (ref 6–20)
Calcium, Ion: 1.11 mmol/L — ABNORMAL LOW (ref 1.15–1.40)
Chloride: 101 mmol/L (ref 98–111)
Creatinine, Ser: 0.5 mg/dL (ref 0.44–1.00)
Glucose, Bld: 133 mg/dL — ABNORMAL HIGH (ref 70–99)
HCT: 46 % (ref 36.0–46.0)
Hemoglobin: 15.6 g/dL — ABNORMAL HIGH (ref 12.0–15.0)
Potassium: 3.7 mmol/L (ref 3.5–5.1)
Sodium: 138 mmol/L (ref 135–145)
TCO2: 24 mmol/L (ref 22–32)

## 2023-03-24 LAB — GLUCOSE, CAPILLARY
Glucose-Capillary: 99 mg/dL (ref 70–99)
Glucose-Capillary: 169 mg/dL — ABNORMAL HIGH (ref 70–99)

## 2023-03-24 LAB — I-STAT CG4 LACTIC ACID, ED: Lactic Acid, Venous: 1.2 mmol/L (ref 0.5–1.9)

## 2023-03-24 LAB — CBG MONITORING, ED: Glucose-Capillary: 133 mg/dL — ABNORMAL HIGH (ref 70–99)

## 2023-03-24 MED ORDER — ACETAMINOPHEN 650 MG RE SUPP: 650.0000 mg | Freq: Four times a day (QID) | RECTAL | Status: DC | PRN

## 2023-03-24 MED ORDER — LACTATED RINGERS IV SOLN: INTRAVENOUS | Status: DC

## 2023-03-24 MED ORDER — SERTRALINE HCL 100 MG PO TABS
100.0000 mg | ORAL_TABLET | Freq: Every day | ORAL | Status: DC
  Administered 2023-03-26 – 2023-03-29 (×4): 100 mg via ORAL
  Filled 2023-03-24 (×4): qty 1

## 2023-03-24 MED ORDER — LISINOPRIL 20 MG PO TABS: 20.0000 mg | ORAL_TABLET | Freq: Every day | ORAL | Status: DC

## 2023-03-24 MED ORDER — ACETAMINOPHEN 325 MG PO TABS: 650.0000 mg | ORAL_TABLET | Freq: Four times a day (QID) | ORAL | Status: DC | PRN

## 2023-03-24 MED ORDER — ALBUTEROL SULFATE (2.5 MG/3ML) 0.083% IN NEBU: 2.5000 mg | INHALATION_SOLUTION | RESPIRATORY_TRACT | Status: DC | PRN

## 2023-03-24 MED ORDER — TRAZODONE HCL 50 MG PO TABS: 25.0000 mg | ORAL_TABLET | Freq: Every evening | ORAL | Status: DC | PRN

## 2023-03-24 MED ORDER — VANCOMYCIN HCL 500 MG/100ML IV SOLN
500.0000 mg | Freq: Once | INTRAVENOUS | Status: AC
  Administered 2023-03-24: 500 mg via INTRAVENOUS
  Filled 2023-03-24: qty 100

## 2023-03-24 MED ORDER — ENOXAPARIN SODIUM 40 MG/0.4ML IJ SOSY
40.0000 mg | PREFILLED_SYRINGE | INTRAMUSCULAR | Status: DC
  Administered 2023-03-24 – 2023-03-28 (×5): 40 mg via SUBCUTANEOUS
  Filled 2023-03-24 (×5): qty 0.4

## 2023-03-24 MED ORDER — VANCOMYCIN HCL IN DEXTROSE 1-5 GM/200ML-% IV SOLN
1000.0000 mg | Freq: Once | INTRAVENOUS | Status: DC
  Filled 2023-03-24: qty 200

## 2023-03-24 MED ORDER — GABAPENTIN 300 MG PO CAPS
300.0000 mg | ORAL_CAPSULE | Freq: Four times a day (QID) | ORAL | Status: DC
  Administered 2023-03-24 – 2023-03-29 (×16): 300 mg via ORAL
  Filled 2023-03-24 (×16): qty 1

## 2023-03-24 MED ORDER — INSULIN ASPART 100 UNIT/ML IJ SOLN
0.0000 [IU] | Freq: Every day | INTRAMUSCULAR | Status: DC
  Administered 2023-03-25: 2 [IU] via SUBCUTANEOUS

## 2023-03-24 MED ORDER — OXYCODONE HCL 5 MG PO TABS: 5.0000 mg | ORAL_TABLET | ORAL | Status: DC | PRN

## 2023-03-24 MED ORDER — LISINOPRIL-HYDROCHLOROTHIAZIDE 20-25 MG PO TABS: 1.0000 | ORAL_TABLET | Freq: Every day | ORAL | Status: DC

## 2023-03-24 MED ORDER — IOHEXOL 350 MG/ML SOLN
100.0000 mL | Freq: Once | INTRAVENOUS | Status: AC | PRN
  Administered 2023-03-24: 100 mL via INTRAVENOUS

## 2023-03-24 MED ORDER — VANCOMYCIN HCL 1500 MG/300ML IV SOLN
1500.0000 mg | Freq: Once | INTRAVENOUS | Status: AC
  Administered 2023-03-24: 1500 mg via INTRAVENOUS
  Filled 2023-03-24: qty 300

## 2023-03-24 MED ORDER — ONDANSETRON HCL 4 MG PO TABS: 4.0000 mg | ORAL_TABLET | Freq: Four times a day (QID) | ORAL | Status: DC | PRN

## 2023-03-24 MED ORDER — ATORVASTATIN CALCIUM 40 MG PO TABS
80.0000 mg | ORAL_TABLET | Freq: Every day | ORAL | Status: DC
  Administered 2023-03-24 – 2023-03-29 (×5): 80 mg via ORAL
  Filled 2023-03-24 (×5): qty 2

## 2023-03-24 MED ORDER — ONDANSETRON HCL 4 MG/2ML IJ SOLN: 4.0000 mg | Freq: Four times a day (QID) | INTRAMUSCULAR | Status: DC | PRN

## 2023-03-24 MED ORDER — VANCOMYCIN HCL 1750 MG/350ML IV SOLN
1750.0000 mg | Freq: Every day | INTRAVENOUS | Status: DC
  Administered 2023-03-25 – 2023-03-28 (×4): 1750 mg via INTRAVENOUS
  Filled 2023-03-24 (×6): qty 350

## 2023-03-24 MED ORDER — INSULIN GLARGINE-YFGN 100 UNIT/ML ~~LOC~~ SOLN
60.0000 [IU] | Freq: Every day | SUBCUTANEOUS | Status: DC
  Administered 2023-03-24: 60 [IU] via SUBCUTANEOUS
  Filled 2023-03-24 (×2): qty 0.6

## 2023-03-24 MED ORDER — HYDROCHLOROTHIAZIDE 25 MG PO TABS: 25.0000 mg | ORAL_TABLET | Freq: Every day | ORAL | Status: DC

## 2023-03-24 MED ORDER — INSULIN ASPART 100 UNIT/ML IJ SOLN: 0.0000 [IU] | Freq: Three times a day (TID) | INTRAMUSCULAR | Status: DC

## 2023-03-24 NOTE — Progress Notes (Addendum)
Pharmacy Antibiotic Note  Donna [name] is a 57 y.o. female with PMH osteomyelitis of R 5th toe s/p amputation after growing MSSA in Oct 2023, now admitted on 03/24/2023 with swelling of R 4th toe and concern for recurrent osteomyelitis. Pharmacy has been consulted for vancomycin dosing.  Plan: Vancomycin 1500 + 500 = 2000 mg IV now, then 1750 mg IV q24 hr (est AUC 528 based on SCr 0.8; Vd 0.5) Measure vancomycin AUC at steady state as indicated SCr q48 while on vanc  Height: 5\' 6"  (167.6 cm) Weight: 103.4 kg (227 lb 15.3 oz) IBW/kg (Calculated) : 59.3  Temp (24hrs), Avg:99.5 F (37.5 C), Min:99.1 F (37.3 C), Max:99.9 F (37.7 C)  Recent Labs  Lab 03/24/23 1406 03/24/23 1415  WBC 11.8*  --   CREATININE 0.59 0.50  LATICACIDVEN  --  1.2    Estimated Creatinine Clearance: 94.2 mL/min (by C-G formula based on SCr of 0.5 mg/dL).    No Known Allergies  Microbiology results: 8/4 BCx: sent   Thank you for allowing pharmacy to be a part of this patient's care.  Kerigan Narvaez A 03/24/2023 5:16 PM

## 2023-03-24 NOTE — ED Notes (Signed)
ED TO INPATIENT HANDOFF REPORT  ED Nurse Name and Phone #: Thamas Jaegers Name/Age/Gender Donna [name] 57 y.o. female Room/Bed: WA11/WA11  Code Status   Code Status: Full Code  Home/SNF/Other Home Patient oriented to: self, place, time, and situation Is this baseline? Yes   Triage Complete: Triage complete  Chief Complaint Osteomyelitis of toe of right foot (HCC) [M86.9]  Triage Note Pt to ED via POV c/o right 4th toe problem. Pt reports she noticed this morning discoloration/darkening of the toe. Pt also c/o right groin pain. Reports hx of the same with right small toe.    Allergies No Known Allergies  Level of Care/Admitting Diagnosis ED Disposition     ED Disposition  Admit   Condition  --   Comment  Hospital Area: [facility] [[id number]]  Level of Care: Med-Surg [16]  May admit patient to [facility] or [facility] if equivalent level of care is available:: Yes  Covid Evaluation: Asymptomatic - no recent exposure (last 10 days) testing not required  Diagnosis: Osteomyelitis of toe of right foot [facility]) [[id number]]  Admitting Physician: Maryln Gottron [[id number]]  Attending Physician: Olexa.Dam, MIR Jaxson.Roy [[id number]]  Certification:: I certify this patient will need inpatient services for at least 2 midnights  Estimated Length of Stay: 3          B Medical/Surgery History Past Medical History:  Diagnosis Date   Diabetes mellitus    type 2- uncontrolled   Hyperlipidemia    Hypertension    Lumbar disc disease    Obesity    Past Surgical History:  Procedure Laterality Date   AMPUTATION TOE Right 06/02/2022   Procedure: RIGHT FIFTH RAY AMPUTATION;  Surgeon: Netta Cedars, MD;  Location: [facility];  Service: Orthopedics;  Laterality: Right;   CHOLECYSTECTOMY  '96   Lap   ESI     lumbar spine, spinal injections   EXCISION HAGLUND'S DEFORMITY WITH ACHILLES TENDON REPAIR Right 07/23/2019   Procedure: Right Achilles Tendon Reconstruction,  Excision of Haglund Deformity;  Surgeon: Toni Arthurs, MD;  Location: [facility];  Service: Orthopedics;  Laterality: Right;   GASTROC RECESSION EXTREMITY Right 07/23/2019   Procedure: Gastroc Recession;  Surgeon: Toni Arthurs, MD;  Location: [facility];  Service: Orthopedics;  Laterality: Right;   REDUCTION MAMMAPLASTY Bilateral    reduction mammoplasty bilaterally       A IV Location/Drains/Wounds Patient Lines/Drains/Airways Status     Active Line/Drains/Airways     Name Placement date Placement time Site Days   Peripheral IV 03/24/23 20 G Right Antecubital 03/24/23  1407  Antecubital  less than 1   Incision (Closed) 07/23/19 Foot Right 07/23/19  1337  -- 1340   Incision (Closed) 06/02/22 Foot 06/02/22  1959  -- 295   Wound / Incision (Open or Dehisced) 06/02/22 Other (Comment) Other (Comment) Right 06/02/22  1605  Other (Comment)  295            Intake/Output Last 24 hours No intake or output data in the 24 hours ending 03/24/23 1651  Labs/Imaging Results for orders placed or performed during the hospital encounter of 03/24/23 (from the past 48 hour(s))  CBG monitoring, ED     Status: Abnormal   Collection Time: 03/24/23  1:40 PM  Result Value Ref Range   Glucose-Capillary 133 (H) 70 - 99 mg/dL    Comment: Glucose reference range applies only to samples taken after fasting for at least 8 hours.  CBC with  Differential/Platelet     Status: Abnormal   Collection Time: 03/24/23  2:06 PM  Result Value Ref Range   WBC 11.8 (H) 4.0 - 10.5 K/uL   RBC 4.96 3.87 - 5.11 MIL/uL   Hemoglobin 15.3 (H) 12.0 - 15.0 g/dL   HCT 16.1 (H) 09.6 - 04.5 %   MCV 92.9 80.0 - 100.0 fL   MCH 30.8 26.0 - 34.0 pg   MCHC 33.2 30.0 - 36.0 g/dL   RDW 40.9 81.1 - 91.4 %   Platelets 256 150 - 400 K/uL   nRBC 0.0 0.0 - 0.2 %   Neutrophils Relative % 83 %   Neutro Abs 9.8 (H) 1.7 - 7.7 K/uL   Lymphocytes Relative 11 %   Lymphs Abs 1.2 0.7 - 4.0 K/uL   Monocytes  Relative 6 %   Monocytes Absolute 0.7 0.1 - 1.0 K/uL   Eosinophils Relative 0 %   Eosinophils Absolute 0.0 0.0 - 0.5 K/uL   Basophils Relative 0 %   Basophils Absolute 0.0 0.0 - 0.1 K/uL   Immature Granulocytes 0 %   Abs Immature Granulocytes 0.05 0.00 - 0.07 K/uL    Comment: Performed at [facility], [address] [place], [place] [address]  Comprehensive metabolic panel     Status: Abnormal   Collection Time: 03/24/23  2:06 PM  Result Value Ref Range   Sodium 137 135 - 145 mmol/L   Potassium 3.6 3.5 - 5.1 mmol/L   Chloride 101 98 - 111 mmol/L   CO2 24 22 - 32 mmol/L   Glucose, Bld 134 (H) 70 - 99 mg/dL    Comment: Glucose reference range applies only to samples taken after fasting for at least 8 hours.   BUN 13 6 - 20 mg/dL   Creatinine, Ser 6.21 0.44 - 1.00 mg/dL   Calcium 9.1 8.9 - 30.8 mg/dL   Total Protein 8.2 (H) 6.5 - 8.1 g/dL   Albumin 4.5 3.5 - 5.0 g/dL   AST 17 15 - 41 U/L   ALT 27 0 - 44 U/L   Alkaline Phosphatase 73 38 - 126 U/L   Total Bilirubin 1.0 0.3 - 1.2 mg/dL   GFR, Estimated >65 >78 mL/min    Comment: (NOTE) Calculated using the CKD-EPI Creatinine Equation (2021)    Anion gap 12 5 - 15    Comment: Performed at [facility], [address], [place], [place] [address]  I-stat chem 8, ed     Status: Abnormal   Collection Time: 03/24/23  2:15 PM  Result Value Ref Range   Sodium 138 135 - 145 mmol/L   Potassium 3.7 3.5 - 5.1 mmol/L   Chloride 101 98 - 111 mmol/L   BUN 11 6 - 20 mg/dL   Creatinine, Ser 9.52 0.44 - 1.00 mg/dL   Glucose, Bld 841 (H) 70 - 99 mg/dL    Comment: Glucose reference range applies only to samples taken after fasting for at least 8 hours.   Calcium, Ion 1.11 (L) 1.15 - 1.40 mmol/L   TCO2 24 22 - 32 mmol/L   Hemoglobin 15.6 (H) 12.0 - 15.0 g/dL   HCT 32.4 40.1 - 02.7 %  I-Stat CG4 Lactic Acid     Status: None   Collection Time: 03/24/23  2:15 PM  Result Value Ref Range   Lactic Acid, Venous  1.2 0.5 - 1.9 mmol/L   CT ANGIO AO+BIFEM W & OR WO CONTRAST  Result Date: 03/24/2023 CLINICAL DATA:  Discoloration/darkening of fourth toe. Evaluate for arterial dissection. EXAM: CT ANGIOGRAPHY OF ABDOMINAL AORTA WITH ILIOFEMORAL RUNOFF TECHNIQUE: Multidetector CT imaging of the abdomen, pelvis and lower extremities was performed using the standard protocol during bolus administration of intravenous contrast. Multiplanar CT image reconstructions and MIPs were obtained to evaluate the vascular anatomy. RADIATION DOSE REDUCTION: This exam was performed according to the departmental dose-optimization program which includes automated exposure control, adjustment of the mA and/or kV according to patient size and/or use of iterative reconstruction technique. CONTRAST:  OMNIPAQUE IOHEXOL 350 MG/ML SOLN COMPARISON:  None Available. FINDINGS: VASCULAR Aorta: Normal caliber aorta without aneurysm, dissection, vasculitis or significant stenosis. Mild aortic atherosclerotic calcifications. Celiac: Patent without evidence of aneurysm, dissection, vasculitis or significant stenosis. SMA: Patent without evidence of aneurysm, dissection, vasculitis or significant stenosis. Renals: Both renal arteries are patent without evidence of aneurysm, dissection, vasculitis, fibromuscular dysplasia or significant stenosis. IMA: Patent without evidence of aneurysm, dissection, vasculitis or significant stenosis. RIGHT Lower Extremity Inflow: Common, internal and external iliac arteries are patent without evidence of aneurysm, dissection, vasculitis or significant stenosis. Outflow: Common, superficial and profunda femoral arteries and the popliteal artery are patent without evidence of aneurysm, dissection, vasculitis or significant stenosis. Runoff: Patent three vessel runoff to the ankle. LEFT Lower Extremity Inflow: Common, internal and external iliac arteries are patent without evidence of aneurysm, dissection, vasculitis or  significant stenosis. Outflow: Common, superficial and profunda femoral arteries and the popliteal artery are patent without evidence of aneurysm, dissection, vasculitis or significant stenosis. Runoff: Patent three vessel runoff to the ankle. Veins: No obvious venous abnormality within the limitations of this arterial phase study. Review of the MIP images confirms the above findings. NON-VASCULAR Lower chest: No acute abnormality. Hepatobiliary: No focal liver abnormality is seen. Status post cholecystectomy. No biliary dilatation. Pancreas: Unremarkable. No pancreatic ductal dilatation or surrounding inflammatory changes. Spleen: Normal in size without focal abnormality. Adrenals/Urinary Tract: Adrenal glands are unremarkable. Kidneys are normal, without renal calculi, focal lesion, or hydronephrosis. Bladder is unremarkable. Stomach/Bowel: Stomach is within normal limits. Appendix appears normal. No evidence of bowel wall thickening, distention, or inflammatory changes. Lymphatic: No enlarged abdominopelvic lymph nodes. Reproductive: Uterus and bilateral adnexa are unremarkable. Other: No free fluid or fluid collections. Musculoskeletal: No acute or significant osseous findings. IMPRESSION: 1. No evidence of arterial dissection or significant stenosis. 2. Non-vascular. No acute abdominopelvic abnormality. 3. Aortic Atherosclerosis (ICD10-I70.0). Electronically Signed   By: Signa Kell M.D.   On: 03/24/2023 15:55    Pending Labs Unresulted Labs (From admission, onward)     Start     Ordered   03/25/23 0500  Basic metabolic panel  Tomorrow morning,   R        03/24/23 1627   03/25/23 0500  CBC  Tomorrow morning,   R        03/24/23 1627   03/24/23 1626  Hemoglobin A1c  (Glycemic Control (SSI)  Q 4 Hours / Glycemic Control (SSI)  AC +/- HS)  Once,   R       Comments: To assess prior glycemic control    03/24/23 1627   03/24/23 1351  Culture, blood (Routine X 2) w Reflex to ID Panel  BLOOD CULTURE X  2,   R     Question:  Patient immune status  Answer:  Normal   03/24/23 1350            Vitals/Pain Today's Vitals   03/24/23 1345 03/24/23 1430 03/24/23 1530 03/24/23 1600  BP: (!) 143/79 138/73 (!) 143/69 133/76  Pulse: 95 98 98 95  Resp: 18 18 17 20   Temp:      TempSrc:      SpO2: 97% 95% 97% 95%  Weight:      Height:      PainSc:        Isolation Precautions No active isolations  Medications Medications  atorvastatin (LIPITOR) tablet 80 mg (has no administration in time range)  lisinopril-hydrochlorothiazide (ZESTORETIC) 20-25 MG per tablet 1 tablet (has no administration in time range)  sertraline (ZOLOFT) tablet 100 mg (has no administration in time range)  gabapentin (NEURONTIN) capsule 300 mg (has no administration in time range)  insulin aspart (novoLOG) injection 0-15 Units (has no administration in time range)  insulin aspart (novoLOG) injection 0-5 Units (has no administration in time range)  enoxaparin (LOVENOX) injection 40 mg (has no administration in time range)  acetaminophen (TYLENOL) tablet 650 mg (has no administration in time range)    Or  acetaminophen (TYLENOL) suppository 650 mg (has no administration in time range)  oxyCODONE (Oxy IR/ROXICODONE) immediate release tablet 5 mg (has no administration in time range)  traZODone (DESYREL) tablet 25 mg (has no administration in time range)  ondansetron (ZOFRAN) tablet 4 mg (has no administration in time range)    Or  ondansetron (ZOFRAN) injection 4 mg (has no administration in time range)  albuterol (PROVENTIL) (2.5 MG/3ML) 0.083% nebulizer solution 2.5 mg (has no administration in time range)  insulin glargine-yfgn (SEMGLEE) injection 60 Units (has no administration in time range)  vancomycin (VANCOREADY) IVPB 1500 mg/300 mL (1,500 mg Intravenous New Bag/Given 03/24/23 1416)  iohexol (OMNIPAQUE) 350 MG/ML injection 100 mL (100 mLs Intravenous Contrast Given 03/24/23 1502)    Mobility walks      Focused Assessments     R Recommendations: See Admitting Provider Note  Report given to:   Additional Notes:

## 2023-03-24 NOTE — ED Triage Notes (Signed)
Pt to ED via POV c/o right 4th toe problem. Pt reports she noticed this morning discoloration/darkening of the toe. Pt also c/o right groin pain. Reports hx of the same with right small toe.

## 2023-03-24 NOTE — ED Provider Notes (Signed)
Pt signed out by Dr. Freida Busman pending CTA.  CTA:  No evidence of arterial dissection or significant stenosis.  2. Non-vascular. No acute abdominopelvic abnormality.  3. Aortic Atherosclerosis (ICD10-I70.0).   Pt does have a swollen right 4th toe with lymphangitis and hx of dm and osteomyelitis resulting in an amputation of her right 5th toe.  Pt was given vancomycin.  Pt d/w Dr. Kirby Crigler for admission.    Jacalyn Lefevre, MD 03/24/23 1622

## 2023-03-24 NOTE — ED Notes (Signed)
Pt to ct via stretcher

## 2023-03-24 NOTE — ED Provider Notes (Signed)
[place] EMERGENCY DEPARTMENT AT [facility] Provider Note   CSN: [id number] Arrival date & time: 03/24/23  1328     History  Chief Complaint  Patient presents with   Toe Injury    Right 4th toe    Donna [name] is a 57 y.o. female.  57 year old female presents with discoloration to right fourth toe.  Patient has history of diabetes as well as peripheral neuropathy.  Patient noticed discoloration and darkening to the distal part of the right fourth toe.  Also noted red streaks going up the dorsal surface of her right foot.  History of same which required amputation.  No clear etiology for it.  Denies any history of peripheral vascular disease.  No vomiting or fever appreciated.  No treatment use prior to arrival       Home Medications Prior to Admission medications   Medication Sig Start Date End Date Taking? Authorizing Provider  acetaminophen (TYLENOL) 500 MG tablet Take 1,000 mg by mouth every 6 (six) hours as needed for mild pain or headache.    [provider]  atorvastatin (LIPITOR) 80 MG tablet Take 1 tablet (80 mg total) by mouth daily. 07/26/22   Myrlene Broker, MD  doxycycline (VIBRA-TABS) 100 MG tablet Take 1 tablet (100 mg total) by mouth 2 (two) times a day for 14 days. 06/29/22     gabapentin (NEURONTIN) 300 MG capsule Take 1 capsule (300 mg total) by mouth 4 (four) times daily. 01/11/23   Myrlene Broker, MD  insulin degludec (TRESIBA FLEXTOUCH) 200 UNIT/ML FlexTouch Pen Inject 110 Units into the skin daily as directed 02/19/23   Myrlene Broker, MD  JARDIANCE 25 MG TABS tablet Take 1 tablet (25 mg total) by mouth daily. 10/02/22   Myrlene Broker, MD  lisinopril-hydrochlorothiazide (ZESTORETIC) 20-25 MG tablet TAKE 1 TABLET BY MOUTH ONCE A DAY 08/28/22 08/28/23  Myrlene Broker, MD  metFORMIN (GLUCOPHAGE) 1000 MG tablet TAKE 1 TABLET BY MOUTH TWICE DAILY Patient taking differently: Take 1,000 mg by mouth 2 (two) times  daily. 04/19/22 05/21/23  Myrlene Broker, MD  sertraline (ZOLOFT) 100 MG tablet TAKE 1 TABLET BY MOUTH ONCE DAILY Patient taking differently: Take 100 mg by mouth daily. 05/24/22 05/28/23  Myrlene Broker, MD  tobramycin-dexamethasone [facility]) ophthalmic solution Place 1 drop in affected eye(s) every 2 hours for 2 days, then 4 times daily for 5 days. Shake well. 09/19/22     trimethoprim-polymyxin b (POLYTRIM) ophthalmic solution Place 1 drop into both eyes every 4 (four) hours. 09/12/22   Daphine Deutscher, Mary-Margaret, FNP  Insulin Glargine (BASAGLAR KWIKPEN) 100 UNIT/ML Inject 0.5 mLs (50 Units total) into the skin 2 (two) times daily. 11/30/19 10/25/20  Myrlene Broker, MD      Allergies    Patient has no known allergies.    Review of Systems   Review of Systems  All other systems reviewed and are negative.   Physical Exam Updated Vital Signs BP (!) 154/83 (BP Location: Left Arm)   Pulse 96   Temp 99.9 F (37.7 C) (Oral)   Resp 18   Ht 1.676 m (5\' 6" )   Wt 102.1 kg   LMP 09/27/2011   SpO2 96%   BMI 36.32 kg/m  Physical Exam Vitals and nursing note reviewed.  Constitutional:      General: She is not in acute distress.    Appearance: Normal appearance. She is well-developed. She is not toxic-appearing.  HENT:  Head: Normocephalic and atraumatic.  Eyes:     General: Lids are normal.     Conjunctiva/sclera: Conjunctivae normal.     Pupils: Pupils are equal, round, and reactive to light.  Neck:     Thyroid: No thyroid mass.     Trachea: No tracheal deviation.  Cardiovascular:     Rate and Rhythm: Normal rate and regular rhythm.     Heart sounds: Normal heart sounds. No murmur heard.    No gallop.  Pulmonary:     Effort: Pulmonary effort is normal. No respiratory distress.     Breath sounds: Normal breath sounds. No stridor. No decreased breath sounds, wheezing, rhonchi or rales.  Abdominal:     General: There is no distension.     Palpations: Abdomen is soft.      Tenderness: There is no abdominal tenderness. There is no rebound.  Musculoskeletal:        General: No tenderness. Normal range of motion.     Cervical back: Normal range of motion and neck supple.     Comments: Erythema and ecchymosis noted to right distal fourth digit.  Palpable dorsalis pedis pulse noted.  Red streaks on top of dorsum of foot going up patient's right lower extremity  Skin:    General: Skin is warm and dry.     Findings: No abrasion or rash.  Neurological:     Mental Status: She is alert and oriented to person, place, and time. Mental status is at baseline.     GCS: GCS eye subscore is 4. GCS verbal subscore is 5. GCS motor subscore is 6.     Cranial Nerves: Cranial nerves are intact. No cranial nerve deficit.     Sensory: No sensory deficit.     Motor: Motor function is intact.  Psychiatric:        Attention and Perception: Attention normal.        Speech: Speech normal.        Behavior: Behavior normal.     ED Results / Procedures / Treatments   Labs (all labs ordered are listed, but only abnormal results are displayed) Labs Reviewed  CBG MONITORING, ED - Abnormal; Notable for the following components:      Result Value   Glucose-Capillary 133 (*)    All other components within normal limits  CBC WITH DIFFERENTIAL/PLATELET  COMPREHENSIVE METABOLIC PANEL  I-STAT CHEM 8, ED    EKG EKG Interpretation Date/Time:  Sunday March 24 2023 14:57:14 EDT Ventricular Rate:  97 PR Interval:  156 QRS Duration:  106 QT Interval:  359 QTC Calculation: 456 R Axis:   -38  Text Interpretation: Sinus rhythm Incomplete RBBB and LAFB Low voltage, precordial leads RSR' in V1 or V2, right VCD or RVH Consider anterior infarct No significant change since last tracing Confirmed by Jacalyn Lefevre [id number]) on 03/24/2023 3:19:24 PM  Radiology No results found.  Procedures Procedures    Medications Ordered in ED Medications  lactated ringers infusion (has no  administration in time range)    ED Course/ Medical Decision Making/ A&P                                 Medical Decision Making Amount and/or Complexity of Data Reviewed Labs: ordered. Radiology: ordered. ECG/medicine tests: ordered.  Risk Prescription drug management.   Patient started on empiric vancomycin for progressive cellulitis.  Seen for possible vascular occlusion.  CT angio of  leg with runoff has been ordered.  Will hold heparin for now.  EKG per interpretation shows normal sinus rhythm.  No evidence of A-fib.  Care turned over to Dr. Particia Nearing        Final Clinical Impression(s) / ED Diagnoses Final diagnoses:  None    Rx / DC Orders ED Discharge Orders     None         Lorre Nick, MD 03/24/23 1523

## 2023-03-24 NOTE — H&P (Signed)
History and Physical  AMREEN RACZKOWSKI ZOX:[id number] DOB: 07/18/1966 DOA: 03/24/2023  PCP: Myrlene Broker, MD   Chief Complaint: Right toe swelling and infection  HPI: Donna [name] is a 57 y.o. female with medical history significant for poorly controlled type 2 diabetes, obesity, hypertension, hyperlipidemia prior right fifth toe osteomyelitis and amputation almost 1 year ago who is being admitted to the hospital with right fourth toe cellulitis and concern for osteomyelitis.  Patient states that she does not have good sensation in her feet, but checks her feet daily.  They look normal yesterday, today she had a little bit of discomfort in her right groin, she noticed some streaking redness in her right foot, and she examined her feet closely, her right fourth toe is swollen, erythematous, and the tip is slightly necrotic in appearance.  She has not had any fevers, chills, nausea, vomiting, or other constitutional symptoms.  ED Course: In the emergency department, vitals have been unremarkable.  Work with mild leukocytosis 11.8, hemoglobin stable at 15.6, unremarkable CMP.  She was given empiric IV vancomycin.  CTA angiogram of the abdomen and bilateral lower extremities was performed without any obvious limiting lesions.  She was given empiric IV vancomycin, and hospitalist was contacted for admission.  Review of Systems: Please see HPI for pertinent positives and negatives. A complete 10 system review of systems are otherwise negative.  Past Medical History:  Diagnosis Date   Diabetes mellitus    type 2- uncontrolled   Hyperlipidemia    Hypertension    Lumbar disc disease    Obesity    Past Surgical History:  Procedure Laterality Date   AMPUTATION TOE Right 06/02/2022   Procedure: RIGHT FIFTH RAY AMPUTATION;  Surgeon: Netta Cedars, MD;  Location: [facility];  Service: Orthopedics;  Laterality: Right;   CHOLECYSTECTOMY  '96   Lap   ESI     lumbar spine, spinal injections    EXCISION HAGLUND'S DEFORMITY WITH ACHILLES TENDON REPAIR Right 07/23/2019   Procedure: Right Achilles Tendon Reconstruction, Excision of Haglund Deformity;  Surgeon: Toni Arthurs, MD;  Location: [facility];  Service: Orthopedics;  Laterality: Right;   GASTROC RECESSION EXTREMITY Right 07/23/2019   Procedure: Gastroc Recession;  Surgeon: Toni Arthurs, MD;  Location: [facility];  Service: Orthopedics;  Laterality: Right;   REDUCTION MAMMAPLASTY Bilateral    reduction mammoplasty bilaterally      Social History:  reports that she has never smoked. She has never used smokeless tobacco. She reports that she does not drink alcohol and does not use drugs.   No Known Allergies  Family History  Problem Relation Age of Onset   Other Father        CHF   Coronary artery disease Father    Cancer Father        renal cell carcinoma   Hypertension Father    Diabetes Father    Diabetes Mother    Coronary artery disease Mother    Heart attack Mother    Cancer Paternal Aunt        Breast   Breast cancer Paternal Aunt 59   Atrial fibrillation Other    Peripheral vascular disease Other    Heart disease Other      Prior to Admission medications   Medication Sig Start Date End Date Taking? Authorizing Provider  acetaminophen (TYLENOL) 500 MG tablet Take 1,000 mg by mouth every 6 (six) hours as needed for mild pain or headache.  [provider]  atorvastatin (LIPITOR) 80 MG tablet Take 1 tablet (80 mg total) by mouth daily. 07/26/22   Myrlene Broker, MD  doxycycline (VIBRA-TABS) 100 MG tablet Take 1 tablet (100 mg total) by mouth 2 (two) times a day for 14 days. 06/29/22     gabapentin (NEURONTIN) 300 MG capsule Take 1 capsule (300 mg total) by mouth 4 (four) times daily. 01/11/23   Myrlene Broker, MD  insulin degludec (TRESIBA FLEXTOUCH) 200 UNIT/ML FlexTouch Pen Inject 110 Units into the skin daily as directed 02/19/23   Myrlene Broker, MD   JARDIANCE 25 MG TABS tablet Take 1 tablet (25 mg total) by mouth daily. 10/02/22   Myrlene Broker, MD  lisinopril-hydrochlorothiazide (ZESTORETIC) 20-25 MG tablet TAKE 1 TABLET BY MOUTH ONCE A DAY 08/28/22 08/28/23  Myrlene Broker, MD  metFORMIN (GLUCOPHAGE) 1000 MG tablet TAKE 1 TABLET BY MOUTH TWICE DAILY Patient taking differently: Take 1,000 mg by mouth 2 (two) times daily. 04/19/22 05/21/23  Myrlene Broker, MD  sertraline (ZOLOFT) 100 MG tablet TAKE 1 TABLET BY MOUTH ONCE DAILY Patient taking differently: Take 100 mg by mouth daily. 05/24/22 05/28/23  Myrlene Broker, MD  tobramycin-dexamethasone [facility]) ophthalmic solution Place 1 drop in affected eye(s) every 2 hours for 2 days, then 4 times daily for 5 days. Shake well. 09/19/22     trimethoprim-polymyxin b (POLYTRIM) ophthalmic solution Place 1 drop into both eyes every 4 (four) hours. 09/12/22   Daphine Deutscher, Mary-Margaret, FNP  Insulin Glargine (BASAGLAR KWIKPEN) 100 UNIT/ML Inject 0.5 mLs (50 Units total) into the skin 2 (two) times daily. 11/30/19 10/25/20  Myrlene Broker, MD    Physical Exam: BP 133/76   Pulse 95   Temp 99.9 F (37.7 C) (Oral)   Resp 20   Ht 5\' 6"  (1.676 m)   Wt 102.1 kg   LMP 09/27/2011   SpO2 95%   BMI 36.32 kg/m   General:  Alert, oriented, calm, in no acute distress, she looks very comfortable and nontoxic Eyes: EOMI, clear conjuctivae, white sclerea Neck: supple, no masses, trachea mildline  Cardiovascular: RRR, no murmurs or rubs, no peripheral edema  Respiratory: clear to auscultation bilaterally, no wheezes, no crackles  Abdomen: soft, nontender, nondistended, normal bowel tones heard  Skin: dry, no rashes  Musculoskeletal: Right foot with patchy streaky erythema going up as high as the anterior right shin, right fourth toe is erythematous, swollen, medial tip of the toe is purple, slightly necrotic in appearance.  There is no open wound or skin injury Psychiatric: appropriate  affect, normal speech  Neurologic: extraocular muscles intact, clear speech, moving all extremities with intact sensorium           Labs on Admission:  Basic Metabolic Panel: Recent Labs  Lab 03/24/23 1406 03/24/23 1415  NA 137 138  K 3.6 3.7  CL 101 101  CO2 24  --   GLUCOSE 134* 133*  BUN 13 11  CREATININE 0.59 0.50  CALCIUM 9.1  --    Liver Function Tests: Recent Labs  Lab 03/24/23 1406  AST 17  ALT 27  ALKPHOS 73  BILITOT 1.0  PROT 8.2*  ALBUMIN 4.5   No results for input(s): "LIPASE", "AMYLASE" in the last 168 hours. No results for input(s): "AMMONIA" in the last 168 hours. CBC: Recent Labs  Lab 03/24/23 1406 03/24/23 1415  WBC 11.8*  --   NEUTROABS 9.8*  --   HGB 15.3* 15.6*  HCT 46.1* 46.0  MCV 92.9  --   PLT 256  --    Cardiac Enzymes: No results for input(s): "CKTOTAL", "CKMB", "CKMBINDEX", "TROPONINI" in the last 168 hours.  BNP (last 3 results) No results for input(s): "BNP" in the last 8760 hours.  ProBNP (last 3 results) No results for input(s): "PROBNP" in the last 8760 hours.  CBG: Recent Labs  Lab 03/24/23 1340  GLUCAP 133*    Radiological Exams on Admission: CT ANGIO AO+BIFEM W & OR WO CONTRAST  Result Date: 03/24/2023 CLINICAL DATA:  Discoloration/darkening of fourth toe. Evaluate for arterial dissection. EXAM: CT ANGIOGRAPHY OF ABDOMINAL AORTA WITH ILIOFEMORAL RUNOFF TECHNIQUE: Multidetector CT imaging of the abdomen, pelvis and lower extremities was performed using the standard protocol during bolus administration of intravenous contrast. Multiplanar CT image reconstructions and MIPs were obtained to evaluate the vascular anatomy. RADIATION DOSE REDUCTION: This exam was performed according to the departmental dose-optimization program which includes automated exposure control, adjustment of the mA and/or kV according to patient size and/or use of iterative reconstruction technique. CONTRAST:  OMNIPAQUE IOHEXOL 350 MG/ML SOLN  COMPARISON:  None Available. FINDINGS: VASCULAR Aorta: Normal caliber aorta without aneurysm, dissection, vasculitis or significant stenosis. Mild aortic atherosclerotic calcifications. Celiac: Patent without evidence of aneurysm, dissection, vasculitis or significant stenosis. SMA: Patent without evidence of aneurysm, dissection, vasculitis or significant stenosis. Renals: Both renal arteries are patent without evidence of aneurysm, dissection, vasculitis, fibromuscular dysplasia or significant stenosis. IMA: Patent without evidence of aneurysm, dissection, vasculitis or significant stenosis. RIGHT Lower Extremity Inflow: Common, internal and external iliac arteries are patent without evidence of aneurysm, dissection, vasculitis or significant stenosis. Outflow: Common, superficial and profunda femoral arteries and the popliteal artery are patent without evidence of aneurysm, dissection, vasculitis or significant stenosis. Runoff: Patent three vessel runoff to the ankle. LEFT Lower Extremity Inflow: Common, internal and external iliac arteries are patent without evidence of aneurysm, dissection, vasculitis or significant stenosis. Outflow: Common, superficial and profunda femoral arteries and the popliteal artery are patent without evidence of aneurysm, dissection, vasculitis or significant stenosis. Runoff: Patent three vessel runoff to the ankle. Veins: No obvious venous abnormality within the limitations of this arterial phase study. Review of the MIP images confirms the above findings. NON-VASCULAR Lower chest: No acute abnormality. Hepatobiliary: No focal liver abnormality is seen. Status post cholecystectomy. No biliary dilatation. Pancreas: Unremarkable. No pancreatic ductal dilatation or surrounding inflammatory changes. Spleen: Normal in size without focal abnormality. Adrenals/Urinary Tract: Adrenal glands are unremarkable. Kidneys are normal, without renal calculi, focal lesion, or hydronephrosis.  Bladder is unremarkable. Stomach/Bowel: Stomach is within normal limits. Appendix appears normal. No evidence of bowel wall thickening, distention, or inflammatory changes. Lymphatic: No enlarged abdominopelvic lymph nodes. Reproductive: Uterus and bilateral adnexa are unremarkable. Other: No free fluid or fluid collections. Musculoskeletal: No acute or significant osseous findings. IMPRESSION: 1. No evidence of arterial dissection or significant stenosis. 2. Non-vascular. No acute abdominopelvic abnormality. 3. Aortic Atherosclerosis (ICD10-I70.0). Electronically Signed   By: Signa Kell M.D.   On: 03/24/2023 15:55    Assessment/Plan Diannia Hogenson Viramontes is a 57 y.o. female with medical history significant for poorly controlled type 2 diabetes, obesity, hypertension, hyperlipidemia prior right fifth toe osteomyelitis and amputation almost 1 year ago who is being admitted to the hospital with right fourth toe cellulitis and concern for osteomyelitis.    Right fourth toe cellulitis and suspected osteomyelitis-the patient is stable, and not septic.  This appears to be rapidly progressive infection likely due to her poorly controlled  diabetes.  She had prior amputation last year with Dr. Odis Hollingshead of [facility]. -Inpatient admission -Empiric IV vancomycin -Right fourth toe x-ray -Message sent to [facility] for consultation in the morning -Will proactively make n.p.o. after midnight in case needs surgical intervention  Poorly controlled type 2 diabetes -Update A1c -Carb controlled diet -Continue basal insulin, at reduced dose since she did not take her insulin today and blood sugar is not significantly elevated -Semglee 60 units daily -Moderate dose sliding scale insulin  Hypertension-continue home Zestoretic  Depression-Zoloft  Hyperlipidemia-Lipitor  Peripheral neuropathy-Neurontin  DVT prophylaxis: Lovenox     Code Status: Full Code  Consults called: [facility]  Admission status:  The appropriate patient status for this patient is INPATIENT. Inpatient status is judged to be reasonable and necessary in order to provide the required intensity of service to ensure the patient's safety. The patient's presenting symptoms, physical exam findings, and initial radiographic and laboratory data in the context of their chronic comorbidities is felt to place them at high risk for further clinical deterioration. Furthermore, it is not anticipated that the patient will be medically stable for discharge from the hospital within 2 midnights of admission.    I certify that at the point of admission it is my clinical judgment that the patient will require inpatient hospital care spanning beyond 2 midnights from the point of admission due to high intensity of service, high risk for further deterioration and high frequency of surveillance required  Time spent: 59 minutes  Johan Antonacci Sharlette Dense MD [facility] Pager [phone]  If 7PM-7AM, please contact night-coverage [email or web address] Password [facility]  03/24/2023, 4:28 PM

## 2023-03-24 NOTE — Plan of Care (Signed)

## 2023-03-25 ENCOUNTER — Inpatient Hospital Stay (HOSPITAL_COMMUNITY): Payer: Commercial Managed Care - PPO

## 2023-03-25 ENCOUNTER — Encounter (HOSPITAL_COMMUNITY): Payer: Self-pay | Admitting: Internal Medicine

## 2023-03-25 DIAGNOSIS — M869 Osteomyelitis, unspecified: Secondary | ICD-10-CM | POA: Diagnosis not present

## 2023-03-25 DIAGNOSIS — M86171 Other acute osteomyelitis, right ankle and foot: Secondary | ICD-10-CM

## 2023-03-25 DIAGNOSIS — Z8619 Personal history of other infectious and parasitic diseases: Secondary | ICD-10-CM

## 2023-03-25 DIAGNOSIS — Z89421 Acquired absence of other right toe(s): Secondary | ICD-10-CM

## 2023-03-25 LAB — GLUCOSE, CAPILLARY
Glucose-Capillary: 105 mg/dL — ABNORMAL HIGH (ref 70–99)
Glucose-Capillary: 110 mg/dL — ABNORMAL HIGH (ref 70–99)
Glucose-Capillary: 153 mg/dL — ABNORMAL HIGH (ref 70–99)
Glucose-Capillary: 216 mg/dL — ABNORMAL HIGH (ref 70–99)
Glucose-Capillary: 78 mg/dL (ref 70–99)
Glucose-Capillary: 87 mg/dL (ref 70–99)

## 2023-03-25 LAB — SURGICAL PCR SCREEN
MRSA, PCR: NEGATIVE
Staphylococcus aureus: POSITIVE — AB

## 2023-03-25 LAB — C-REACTIVE PROTEIN: CRP: 6.8 mg/dL — ABNORMAL HIGH (ref <1.0)

## 2023-03-25 LAB — HEMOGLOBIN A1C
Hgb A1c MFr Bld: 8.6 % — ABNORMAL HIGH (ref 4.8–5.6)
Mean Plasma Glucose: 200.12 mg/dL

## 2023-03-25 LAB — SEDIMENTATION RATE: Sed Rate: 38 mm/hr — ABNORMAL HIGH (ref 0–22)

## 2023-03-25 MED ORDER — POTASSIUM CHLORIDE IN NACL 20-0.9 MEQ/L-% IV SOLN
INTRAVENOUS | Status: DC
  Filled 2023-03-25 (×2): qty 1000

## 2023-03-25 MED ORDER — INSULIN GLARGINE-YFGN 100 UNIT/ML ~~LOC~~ SOLN
40.0000 [IU] | Freq: Every day | SUBCUTANEOUS | Status: DC
  Administered 2023-03-25 – 2023-03-28 (×4): 40 [IU] via SUBCUTANEOUS
  Filled 2023-03-25 (×5): qty 0.4

## 2023-03-25 MED ORDER — CHLORHEXIDINE GLUCONATE 4 % EX SOLN
60.0000 mL | Freq: Once | CUTANEOUS | Status: DC
  Filled 2023-03-25: qty 60

## 2023-03-25 MED ORDER — MUPIROCIN 2 % EX OINT
1.0000 | TOPICAL_OINTMENT | Freq: Two times a day (BID) | CUTANEOUS | Status: DC
  Administered 2023-03-25 – 2023-03-29 (×8): 1 via NASAL
  Filled 2023-03-25: qty 22

## 2023-03-25 MED ORDER — MIDAZOLAM HCL 2 MG/2ML IJ SOLN: 0.5000 mg | Freq: Once | INTRAMUSCULAR | Status: DC

## 2023-03-25 MED ORDER — METRONIDAZOLE 500 MG/100ML IV SOLN
500.0000 mg | Freq: Two times a day (BID) | INTRAVENOUS | Status: DC
  Administered 2023-03-25: 500 mg via INTRAVENOUS
  Filled 2023-03-25: qty 100

## 2023-03-25 MED ORDER — INSULIN GLARGINE-YFGN 100 UNIT/ML ~~LOC~~ SOLN: 40.0000 [IU] | Freq: Every day | SUBCUTANEOUS | Status: DC

## 2023-03-25 MED ORDER — POTASSIUM CHLORIDE CRYS ER 20 MEQ PO TBCR: 40.0000 meq | EXTENDED_RELEASE_TABLET | Freq: Once | ORAL | Status: DC

## 2023-03-25 MED ORDER — ORAL CARE MOUTH RINSE: 15.0000 mL | Freq: Once | OROMUCOSAL | Status: AC

## 2023-03-25 MED ORDER — GADOBUTROL 1 MMOL/ML IV SOLN
10.0000 mL | Freq: Once | INTRAVENOUS | Status: AC | PRN
  Administered 2023-03-25: 10 mL via INTRAVENOUS

## 2023-03-25 MED ORDER — CHLORHEXIDINE GLUCONATE 0.12 % MT SOLN
15.0000 mL | Freq: Once | OROMUCOSAL | Status: AC
  Administered 2023-03-25: 15 mL via OROMUCOSAL

## 2023-03-25 MED ORDER — FENTANYL CITRATE PF 50 MCG/ML IJ SOSY: 25.0000 ug | PREFILLED_SYRINGE | Freq: Once | INTRAMUSCULAR | Status: DC

## 2023-03-25 MED ORDER — SODIUM CHLORIDE 0.9 % IV SOLN
2.0000 g | Freq: Three times a day (TID) | INTRAVENOUS | Status: DC
  Administered 2023-03-25 – 2023-03-28 (×9): 2 g via INTRAVENOUS
  Filled 2023-03-25 (×9): qty 12.5

## 2023-03-25 MED ORDER — LACTATED RINGERS IV SOLN: INTRAVENOUS | Status: DC

## 2023-03-25 MED ORDER — CEFAZOLIN SODIUM-DEXTROSE 2-4 GM/100ML-% IV SOLN: 2.0000 g | INTRAVENOUS | Status: DC

## 2023-03-25 NOTE — Progress Notes (Signed)
[facility] Pre-op Review  Pre-op checklist: asked RN to complete  NPO: order placed at 0742, RN states pt has not had anything to eat oday  Labs: WNL, CBG 105 at 1140am  Consent:  needs to be obtained  H&P: needs to be completed  Vitals: WNL  O2 requirements: 96% RA  MAR/PTA review: completed Cefepime given at 1223 Flagyl given at 1040  IV: 20g RAC  Floor nurse name:  Konrad Felix RN 4E  Additional info: NA

## 2023-03-25 NOTE — Care Plan (Addendum)
Orthopaedic Surgery Plan of Care Note  -discussed with OR control desk for scheduled 6:30 PM start time -informed that case cannot be started as planned due to another patient's gynecology emergency -reviewed situation with anesthesiologist Dr. Richardson Landry, OR control desk charge nurse, PACU/Short Stay nurse directors -no estimated time available for when this patient will be able to go to OR tonight as there is no back up team available. Offered to do case under local given the urgency; however informed that [facility] policy that any local case can only be done with anesthesiology/CRNA presence in the room -tried calling [facility] OR desk but informed there is no OR availability at this time either -patient has been NPO for nearly 24 hours at this time (she last ate evening of 03/24/23) -informed patient about evolving situation and will continue to monitor/update  Netta Cedars, MD Orthopaedic Surgery [facility]  Update: -reviewed again with OR team, no ETA given and informed that case is indefinitely delayed at this time due to lack of staffing -pt remains stable on antibiotics, will reschedule surgery to 03/26/23 either early AM, mid-day or afternoon pending OR availability with close monitoring overnight/until OR time -reviewed at length with patient as well as nursing director team. Pt given diet and will be NPO from 9 PM for possible early morning surgery  Netta Cedars, MD Orthopaedic Surgery [facility]

## 2023-03-25 NOTE — Progress Notes (Signed)
Phone call to pt night shift RN and informed that pt surgery is r/s to tomorrow morning at 0715. MD aware, OR aware, anesthesia aware, SS aware. RN verbalized understanding and will pass information along to pt.

## 2023-03-25 NOTE — Progress Notes (Signed)
Pharmacy Antibiotic Note  ZELA CANTOR is a 57 y.o. female with PMH of diabetes, osteomyelitis of R 5th toe s/p amputation after growing MSSA in Oct 2023, now admitted on 03/24/2023 with swelling of R 4th toe and concern for osteomyelitis.   Pharmacy has been consulted for vancomycin and cefepime dosing.  Today, 03/25/23 WBC WNL SCr stable, rounded to 0.8 for dose calculations Afebrile  MRI ordered. Ortho consulted.   Plan: Cefepime 2 g IV q8h + metronidazole 500 mg IV q12h Continue vancomycin 1750 mg IV q24h for estimated AUC of 525. Goal 400-550.  Monitor renal function, culture data. Check vancomycin levels at steady state as needed.   Height: 5\' 6"  (167.6 cm) Weight: 103.4 kg (227 lb 15.3 oz) IBW/kg (Calculated) : 59.3  Temp (24hrs), Avg:99.2 F (37.3 C), Min:98 F (36.7 C), Max:99.9 F (37.7 C)  Recent Labs  Lab 03/24/23 1406 03/24/23 1415 03/25/23 0444  WBC 11.8*  --  9.4  CREATININE 0.59 0.50 0.56  LATICACIDVEN  --  1.2  --     Estimated Creatinine Clearance: 94.2 mL/min (by C-G formula based on SCr of 0.56 mg/dL).    No Known Allergies  Microbiology results: 8/4 BCx: ngtd  Antibiotics: -Vancomycin 8/4 >> -Cefepime 8/5 >> -Metronidazole 8/5 >>  Cindi Carbon, PharmD 03/25/2023 9:40 AM

## 2023-03-25 NOTE — Progress Notes (Signed)
PROGRESS NOTE Donna BEHMER  PIR:[id number] DOB: 1966-02-07 DOA: 03/24/2023 PCP: Myrlene Broker, MD  Brief Narrative/Hospital Course: 57 y.o. female with medical history significant for poorly controlled type 2 diabetes, obesity, hypertension, hyperlipidemia prior right fifth toe osteomyelitis and amputation almost 1 year ago, who is being admitted to the hospital with right fourth toe cellulitis and concern for osteomyelitis.  Patient endorses not having good sensation in her feet, started noticing some streaking redness in her right foot and right fourth toe was swollen erythematous deep necrotic. In the ED: Vitals overall stable labs with mild leukocytosis, CT angio of the abdomen and bilateral lower extremities without any obvious limiting lesions, given IV antibiotics and admitted for further management.    Subjective: Patient seen and examined Complains of feeling bad, darkness on her right to progressed since admission so also has streaking and erythema slightly progressed in her lower leg No chest pain nausea vomiting fever chills Since admission afebrile, BP low 100-150s, not hypoxic Labs this morning WBC count normalized to 9.4 BMP with K slightly low  Assessment and Plan: Principal Problem:   Osteomyelitis of toe of right foot (HCC)   Right fourth toe cellulitis  w/ acute osteomyelitis of the distal phalanx of right fourth toe Previous amputation of the right fifth ray: Patient afebrile, with initial mild leukocytosis in the ED, underwent CT angio of the abdomen and bilateral lower extremities-NAD.  Infection appears rapidly progressing in the setting of poorly controlled diabetes mellitus with prior amputation last year- Dr. Odis Hollingshead of [facility].  Orthopedic has been consulted. Discussed with Dr. Odis Hollingshead ordering stat MRI, n.p.o. and broadening antibiotics with vancomycin/cefepime Flagyl: Addendum MRI subsequently came back positive for acute osteomyelitis of the  distal phalanx of the right fourth toe prominent soft tissue swelling of the fourth toe with suspected cutaneous blistering, has amputation of the fifth ray> I informed orthopedics, likely for OR today  Poorly controlled type 2 diabetes: Previous A1c 8.0 in September.  At home on Tresiba 100 units daily, Jardiance, metformin.Blood sugar fairly controlled A1c pending, continue current Semglee 40 units daily and SSI Changed to q4hr Lab Results  Component Value Date   HGBA1C 8.6 (H) 03/25/2023    Recent Labs  Lab 03/24/23 1340 03/24/23 1710 03/24/23 2056 03/25/23 0444 03/25/23 0742 03/25/23 1140  GLUCAP 133* 99 169*  --  110* 105*  HGBA1C  --   --   --  8.6*  --   --     Hypertension: Stable, at home on Zestoretic-hold as bp ok today.  Hypokalemia: replaced   Depression: Cont her zoloft   Hyperlipidemia: Cont home lipitor   Peripheral neuropathy: Cont her Neurontin  Obesity: Patient's Body mass index is 36.79 kg/m. : Will benefit with PCP follow-up, weight loss  healthy lifestyle and outpatient sleep evaluation if nto done already.  DVT prophylaxis: enoxaparin (LOVENOX) injection 40 mg Start: 03/24/23 2200 SCDs Start: 03/24/23 1626 Code Status:   Code Status: Full Code Family Communication: plan of care discussed with patient at bedside. Patient status is: Inpatient because of cellulitis and suspected osteomyelitis of fourth toe Level of care: Med-Surg   Dispo: The patient is from: home            Anticipated disposition: Pending clinical improvement  Objective: Vitals last 24 hrs: Vitals:   03/24/23 2101 03/25/23 0045 03/25/23 0554 03/25/23 1143  BP: 118/71 107/62 116/71 116/63  Pulse: 98 91 82 80  Resp: 20 15 15 18   Temp: 99.6 F (37.6  C) 99.2 F (37.3 C) 98 F (36.7 C) 98.2 F (36.8 C)  TempSrc: Oral Oral Oral Oral  SpO2: 94% 95% 95% 96%  Weight:      Height:       Weight change:   Physical Examination: General exam: alert awake, older than stated  age HEENT:Oral mucosa moist, Ear/Nose WNL grossly Respiratory system: bilaterally clear BS, no use of accessory muscle Cardiovascular system: S1 & S2 +, No JVD. Gastrointestinal system: Abdomen soft,NT,ND, BS+ Nervous System:Alert, awake, moving extremities. Extremities: LE edema with erythema and blackish discoloration in the right fourth toe see picture.  Right knee is warm, dorsalis pedis palpable Skin: No rashes,no icterus. MSK: Normal muscle bulk,tone, power  Picture 8/5:     Picture on admission 8/4    Medications reviewed:  Scheduled Meds:  atorvastatin  80 mg Oral Daily   enoxaparin (LOVENOX) injection  40 mg Subcutaneous Q24H   gabapentin  300 mg Oral QID   lisinopril  20 mg Oral Daily   And   hydrochlorothiazide  25 mg Oral Daily   insulin aspart  0-15 Units Subcutaneous TID WC   insulin aspart  0-5 Units Subcutaneous QHS   insulin glargine-yfgn  40 Units Subcutaneous QHS   potassium chloride  40 mEq Oral Once   sertraline  100 mg Oral Daily   Continuous Infusions:  ceFEPime (MAXIPIME) IV     metronidazole 500 mg (03/25/23 1040)   vancomycin 1,750 mg (03/25/23 1035)    Diet Order             Diet NPO time specified  Diet effective now                  Intake/Output Summary (Last 24 hours) at 03/25/2023 1158 Last data filed at 03/25/2023 0700 Gross per 24 hour  Intake 575.59 ml  Output --  Net 575.59 ml   Net IO Since Admission: 575.59 mL [03/25/23 1158]  Wt Readings from Last 3 Encounters:  03/24/23 103.4 kg  06/12/22 100.9 kg  06/02/22 101.2 kg     Unresulted Labs (From admission, onward)     Start     Ordered   03/26/23 0500  Basic metabolic panel  Daily,   R      03/25/23 0711   03/26/23 0500  CBC  Daily,   R      03/25/23 0711   03/25/23 0922  C-reactive protein  ONCE - STAT,   STAT        03/25/23 0921   03/25/23 0922  Sedimentation rate  ONCE - STAT,   STAT        03/25/23 0921          Data Reviewed: I have personally  reviewed following labs and imaging studies CBC: Recent Labs  Lab 03/24/23 1406 03/24/23 1415 03/25/23 0444  WBC 11.8*  --  9.4  NEUTROABS 9.8*  --   --   HGB 15.3* 15.6* 14.3  HCT 46.1* 46.0 44.6  MCV 92.9  --  94.7  PLT 256  --  259   Basic Metabolic Panel: Recent Labs  Lab 03/24/23 1406 03/24/23 1415 03/25/23 0444  NA 137 138 135  K 3.6 3.7 3.3*  CL 101 101 101  CO2 24  --  22  GLUCOSE 134* 133* 99  BUN 13 11 13   CREATININE 0.59 0.50 0.56  CALCIUM 9.1  --  8.5*   GFR: Estimated Creatinine Clearance: 94.2 mL/min (by C-G formula based on SCr  of 0.56 mg/dL). Liver Function Tests: Recent Labs  Lab 03/24/23 1406  AST 17  ALT 27  ALKPHOS 73  BILITOT 1.0  PROT 8.2*  ALBUMIN 4.5   CBG: Recent Labs  Lab 03/24/23 1340 03/24/23 1710 03/24/23 2056 03/25/23 0742 03/25/23 1140  GLUCAP 133* 99 169* 110* 105*   Recent Labs  Lab 03/24/23 1415  LATICACIDVEN 1.2    Recent Results (from the past 240 hour(s))  Culture, blood (Routine X 2) w Reflex to ID Panel     Status: None (Preliminary result)   Collection Time: 03/24/23  2:06 PM   Specimen: BLOOD  Result Value Ref Range Status   Specimen Description   Final    BLOOD BLOOD LEFT HAND Performed at [facility], [address], [place], [place] [address]    Special Requests   Final    BOTTLES DRAWN AEROBIC AND ANAEROBIC Blood Culture results may not be optimal due to an excessive volume of blood received in culture bottles Performed at [facility], [address], [place], [place] [address]    Culture   Final    NO GROWTH < 24 HOURS Performed at [facility], [address], [place], [place] [address]    Report Status PENDING  Incomplete  Culture, blood (Routine X 2) w Reflex to ID Panel     Status: None (Preliminary result)   Collection Time: 03/24/23  2:06 PM   Specimen: BLOOD  Result Value Ref Range Status   Specimen Description   Final    BLOOD RIGHT  ANTECUBITAL Performed at [facility], [address] [place], [place] [address]    Special Requests   Final    BOTTLES DRAWN AEROBIC AND ANAEROBIC Blood Culture adequate volume Performed at [facility], [address], [place], [place] [address]    Culture   Final    NO GROWTH < 24 HOURS Performed at [facility], [address] [place], [place] [address]    Report Status PENDING  Incomplete    Antimicrobials: Anti-infectives (From admission, onward)    Start     Dose/Rate Route Frequency Ordered Stop   03/25/23 1100  ceFEPIme (MAXIPIME) 2 g in sodium chloride 0.9 % 100 mL IVPB        2 g 200 mL/hr over 30 Minutes Intravenous Every 8 hours 03/25/23 0945     03/25/23 1030  metroNIDAZOLE (FLAGYL) IVPB 500 mg        500 mg 100 mL/hr over 60 Minutes Intravenous Every 12 hours 03/25/23 0944     03/25/23 1000  vancomycin (VANCOREADY) IVPB 1750 mg/350 mL        1,750 mg 175 mL/hr over 120 Minutes Intravenous Daily 03/24/23 1709     03/24/23 1800  vancomycin (VANCOREADY) IVPB 500 mg/100 mL        500 mg 100 mL/hr over 60 Minutes Intravenous  Once 03/24/23 1709 03/24/23 2015   03/24/23 1400  vancomycin (VANCOCIN) IVPB 1000 mg/200 mL premix  Status:  Discontinued        1,000 mg 200 mL/hr over 60 Minutes Intravenous  Once 03/24/23 1348 03/24/23 1355   03/24/23 1400  vancomycin (VANCOREADY) IVPB 1500 mg/300 mL        1,500 mg 150 mL/hr over 120 Minutes Intravenous  Once 03/24/23 1355 03/24/23 1652      Culture/Microbiology    Component Value Date/Time   SDES  03/24/2023 1406  BLOOD BLOOD LEFT HAND Performed at [facility], [address], [place], [place] [address]    SDES  03/24/2023 1406    BLOOD RIGHT ANTECUBITAL Performed at [facility], [address] [place], [place] [address]    SPECREQUEST  03/24/2023 1406    BOTTLES DRAWN AEROBIC AND ANAEROBIC Blood Culture results may not be  optimal due to an excessive volume of blood received in culture bottles Performed at [facility], [address], [place], [place] [address]    SPECREQUEST  03/24/2023 1406    BOTTLES DRAWN AEROBIC AND ANAEROBIC Blood Culture adequate volume Performed at [facility], [address] [place], [place] [address]    CULT  03/24/2023 1406    NO GROWTH < 24 HOURS Performed at [facility], [address] [place], [place] [address]    CULT  03/24/2023 1406    NO GROWTH < 24 HOURS Performed at [facility], [address] [place], [place] [address]    REPTSTATUS PENDING 03/24/2023 1406   REPTSTATUS PENDING 03/24/2023 1406    Radiology Studies: MR FOOT RIGHT W WO CONTRAST  Result Date: 03/25/2023 CLINICAL DATA:  Osteomyelitis, foot EXAM: MRI OF THE RIGHT FOREFOOT WITHOUT AND WITH CONTRAST TECHNIQUE: Multiplanar, multisequence MR imaging of the right forefoot was performed before and after the administration of intravenous contrast. CONTRAST:  10mL GADAVIST GADOBUTROL 1 MMOL/ML IV SOLN COMPARISON:  X-ray 03/25/2023, MRI 06/02/2022 FINDINGS: Bones/Joint/Cartilage Interval fifth ray resection the level of the mid fifth metatarsal diaphysis. No erosion or marrow edema within the residual fifth metatarsal. There is bone marrow edema within the distal phalanx of the fourth toe with intermediate to low T1 marrow signal in the distal tuft compatible with acute osteomyelitis. Preserved bone marrow signal within the proximal and middle phalanx of the fourth toe. No additional sites of bone marrow edema or marrow replacement. No fracture or dislocation. Similar degenerative changes of foot. Ligaments Intact Lisfranc ligament.  Intact collateral ligaments. Muscles and Tendons Denervation changes of the foot musculature.  No tenosynovitis. Soft tissues Prominent soft tissue swelling of the fourth toe with suspected cutaneous blistering. No organized or rim  enhancing fluid collections within the soft tissues. IMPRESSION: 1. Acute osteomyelitis of the distal phalanx of the right fourth toe. 2. Prominent soft tissue swelling of the fourth toe with suspected cutaneous blistering. 3. Interval transmetatarsal amputation of the fifth ray. No evidence of osteomyelitis within the residual fifth metatarsal. Electronically Signed   By: Duanne Guess D.O.   On: 03/25/2023 11:07   DG Toe 4th Right  Result Date: 03/24/2023 CLINICAL DATA:  Evaluate for osteomyelitis. Patient complains of discoloration/darkening of the fourth toe. EXAM: RIGHT FOURTH TOE COMPARISON:  None Available. FINDINGS: The AP and oblique images are suboptimal as the distal phalanx is in flexion and obscured by overlying osseous structures. No signs of acute fracture or dislocation. No focal bone erosions identified. There is diffuse soft tissue edema. No signs of acute fracture. IMPRESSION: 1. Suboptimal evaluation of the distal phalanx of the fourth toe. No signs of acute fracture, dislocation or bony erosion of osteomyelitis. 2. Diffuse soft tissue edema. Electronically Signed   By: Signa Kell M.D.   On: 03/24/2023 17:21   CT ANGIO AO+BIFEM W & OR WO CONTRAST  Result Date: 03/24/2023 CLINICAL DATA:  Discoloration/darkening of fourth toe. Evaluate for arterial dissection. EXAM: CT ANGIOGRAPHY OF ABDOMINAL AORTA WITH ILIOFEMORAL RUNOFF TECHNIQUE: Multidetector CT imaging of  the abdomen, pelvis and lower extremities was performed using the standard protocol during bolus administration of intravenous contrast. Multiplanar CT image reconstructions and MIPs were obtained to evaluate the vascular anatomy. RADIATION DOSE REDUCTION: This exam was performed according to the departmental dose-optimization program which includes automated exposure control, adjustment of the mA and/or kV according to patient size and/or use of iterative reconstruction technique. CONTRAST:  OMNIPAQUE IOHEXOL 350 MG/ML  SOLN COMPARISON:  None Available. FINDINGS: VASCULAR Aorta: Normal caliber aorta without aneurysm, dissection, vasculitis or significant stenosis. Mild aortic atherosclerotic calcifications. Celiac: Patent without evidence of aneurysm, dissection, vasculitis or significant stenosis. SMA: Patent without evidence of aneurysm, dissection, vasculitis or significant stenosis. Renals: Both renal arteries are patent without evidence of aneurysm, dissection, vasculitis, fibromuscular dysplasia or significant stenosis. IMA: Patent without evidence of aneurysm, dissection, vasculitis or significant stenosis. RIGHT Lower Extremity Inflow: Common, internal and external iliac arteries are patent without evidence of aneurysm, dissection, vasculitis or significant stenosis. Outflow: Common, superficial and profunda femoral arteries and the popliteal artery are patent without evidence of aneurysm, dissection, vasculitis or significant stenosis. Runoff: Patent three vessel runoff to the ankle. LEFT Lower Extremity Inflow: Common, internal and external iliac arteries are patent without evidence of aneurysm, dissection, vasculitis or significant stenosis. Outflow: Common, superficial and profunda femoral arteries and the popliteal artery are patent without evidence of aneurysm, dissection, vasculitis or significant stenosis. Runoff: Patent three vessel runoff to the ankle. Veins: No obvious venous abnormality within the limitations of this arterial phase study. Review of the MIP images confirms the above findings. NON-VASCULAR Lower chest: No acute abnormality. Hepatobiliary: No focal liver abnormality is seen. Status post cholecystectomy. No biliary dilatation. Pancreas: Unremarkable. No pancreatic ductal dilatation or surrounding inflammatory changes. Spleen: Normal in size without focal abnormality. Adrenals/Urinary Tract: Adrenal glands are unremarkable. Kidneys are normal, without renal calculi, focal lesion, or hydronephrosis.  Bladder is unremarkable. Stomach/Bowel: Stomach is within normal limits. Appendix appears normal. No evidence of bowel wall thickening, distention, or inflammatory changes. Lymphatic: No enlarged abdominopelvic lymph nodes. Reproductive: Uterus and bilateral adnexa are unremarkable. Other: No free fluid or fluid collections. Musculoskeletal: No acute or significant osseous findings. IMPRESSION: 1. No evidence of arterial dissection or significant stenosis. 2. Non-vascular. No acute abdominopelvic abnormality. 3. Aortic Atherosclerosis (ICD10-I70.0). Electronically Signed   By: Signa Kell M.D.   On: 03/24/2023 15:55     LOS: 1 day   Lanae Boast, MD [facility]  03/25/2023, 11:58 AM

## 2023-03-25 NOTE — Hospital Course (Signed)
57 y.o. female with medical history significant for poorly controlled type 2 diabetes, obesity, hypertension, hyperlipidemia prior right fifth toe osteomyelitis and amputation almost 1 year ago, who is being admitted to the hospital with right fourth toe cellulitis and concern for osteomyelitis.  Patient endorses not having good sensation in her feet, started noticing some streaking redness in her right foot and right fourth toe was swollen erythematous deep necrotic. In the ED: Vitals overall stable labs with mild leukocytosis, CT angio of the abdomen and bilateral lower extremities without any obvious limiting lesions, given IV antibiotics and admitted for further management.

## 2023-03-25 NOTE — Plan of Care (Signed)
  Problem: Education: Goal: Knowledge of General Education information will improve Description Including pain rating scale, medication(s)/side effects and non-pharmacologic comfort measures Outcome: Progressing   Problem: Clinical Measurements: Goal: Ability to maintain clinical measurements within normal limits will improve Outcome: Progressing Goal: Will remain free from infection Outcome: Progressing Goal: Diagnostic test results will improve Outcome: Progressing Goal: Respiratory complications will improve Outcome: Progressing Goal: Cardiovascular complication will be avoided Outcome: Progressing   Problem: Pain Managment: Goal: General experience of comfort will improve Outcome: Progressing   

## 2023-03-25 NOTE — Consult Note (Signed)
Patient ID: Donna [name] MRN: [id number] DOB/AGE: 09-29-1965 57 y.o.  Admit date: 03/24/2023  Admission Diagnoses:  Principal Problem:   Osteomyelitis of toe of right foot (HCC)   HPI: Ortho consult for right 4th toe osteomyelitis of distal phalanx with cutaneous blistering x  1 day. The patient underwent right fifth ray amputation on 06/02/22 with IV abx postop for MSSA. She does not recall any specific injury or insect bite, etc to the right foot.  PMH notable for type 2 DM (A1c 8.6). Denies fever/chills/drainage.  Denies numbness/tingling.  Past Medical History: Past Medical History:  Diagnosis Date   Diabetes mellitus    type 2- uncontrolled   Hyperlipidemia    Hypertension    Lumbar disc disease    Obesity     Surgical History: Past Surgical History:  Procedure Laterality Date   AMPUTATION TOE Right 06/02/2022   Procedure: RIGHT FIFTH RAY AMPUTATION;  Surgeon: Netta Cedars, MD;  Location: [facility];  Service: Orthopedics;  Laterality: Right;   CHOLECYSTECTOMY  '96   Lap   ESI     lumbar spine, spinal injections   EXCISION HAGLUND'S DEFORMITY WITH ACHILLES TENDON REPAIR Right 07/23/2019   Procedure: Right Achilles Tendon Reconstruction, Excision of Haglund Deformity;  Surgeon: Toni Arthurs, MD;  Location: [facility];  Service: Orthopedics;  Laterality: Right;   GASTROC RECESSION EXTREMITY Right 07/23/2019   Procedure: Gastroc Recession;  Surgeon: Toni Arthurs, MD;  Location: [facility];  Service: Orthopedics;  Laterality: Right;   REDUCTION MAMMAPLASTY Bilateral    reduction mammoplasty bilaterally      Family History: Family History  Problem Relation Age of Onset   Other Father        CHF   Coronary artery disease Father    Cancer Father        renal cell carcinoma   Hypertension Father    Diabetes Father    Diabetes Mother    Coronary artery disease Mother    Heart attack Mother    Cancer Paternal Aunt         Breast   Breast cancer Paternal Aunt 53   Atrial fibrillation Other    Peripheral vascular disease Other    Heart disease Other     Social History: Social History   Socioeconomic History   Marital status: Single    Spouse name: Not on file   Number of children: Not on file   Years of education: 16   Highest education level: Not on file  Occupational History   Occupation: NURSE    Employer: [employer]  Tobacco Use   Smoking status: Never   Smokeless tobacco: Never  Substance and Sexual Activity   Alcohol use: No   Drug use: No   Sexual activity: Not Currently  Other Topics Concern   Not on file  Social History Narrative   HSG, [employer]- [employer]. single, Lives in own home- mother lives with her. 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 Denies any h/o physical or sexual abuse   Social Determinants of Health   Financial Resource Strain: Not on file  Food Insecurity: No Food Insecurity (03/24/2023)   Hunger Vital Sign    Worried About Running Out of Food in the Last Year: Never true    Ran Out of Food in the Last Year: Never true  Transportation Needs: No Transportation Needs (03/24/2023)   PRAPARE - Administrator, Civil Service (Medical): No  Lack of Transportation (Non-Medical): No  Physical Activity: Not on file  Stress: Not on file  Social Connections: Not on file  Intimate Partner Violence: Not At Risk (03/24/2023)   Humiliation, Afraid, Rape, and Kick questionnaire    Fear of Current or Ex-Partner: No    Emotionally Abused: No    Physically Abused: No    Sexually Abused: No    Allergies: Patient has no known allergies.  Medications: I have reviewed the patient's current medications.  Vital Signs: Patient Vitals for the past 24 hrs:  BP Temp Temp src Pulse Resp SpO2  03/25/23 1710 128/83 98.2 F (36.8 C) Oral 92 18 98 %  03/25/23 1143 116/63 98.2 F (36.8 C) Oral 80 18 96 %  03/25/23 0554 116/71 98 F (36.7 C) Oral 82 15 95 %  03/25/23  0045 107/62 99.2 F (37.3 C) Oral 91 15 95 %  03/24/23 2101 118/71 99.6 F (37.6 C) Oral 98 20 94 %    Radiology: MR FOOT RIGHT W WO CONTRAST  Result Date: 03/25/2023 CLINICAL DATA:  Osteomyelitis, foot EXAM: MRI OF THE RIGHT FOREFOOT WITHOUT AND WITH CONTRAST TECHNIQUE: Multiplanar, multisequence MR imaging of the right forefoot was performed before and after the administration of intravenous contrast. CONTRAST:  10mL GADAVIST GADOBUTROL 1 MMOL/ML IV SOLN COMPARISON:  X-ray 03/25/2023, MRI 06/02/2022 FINDINGS: Bones/Joint/Cartilage Interval fifth ray resection the level of the mid fifth metatarsal diaphysis. No erosion or marrow edema within the residual fifth metatarsal. There is bone marrow edema within the distal phalanx of the fourth toe with intermediate to low T1 marrow signal in the distal tuft compatible with acute osteomyelitis. Preserved bone marrow signal within the proximal and middle phalanx of the fourth toe. No additional sites of bone marrow edema or marrow replacement. No fracture or dislocation. Similar degenerative changes of foot. Ligaments Intact Lisfranc ligament.  Intact collateral ligaments. Muscles and Tendons Denervation changes of the foot musculature.  No tenosynovitis. Soft tissues Prominent soft tissue swelling of the fourth toe with suspected cutaneous blistering. No organized or rim enhancing fluid collections within the soft tissues. IMPRESSION: 1. Acute osteomyelitis of the distal phalanx of the right fourth toe. 2. Prominent soft tissue swelling of the fourth toe with suspected cutaneous blistering. 3. Interval transmetatarsal amputation of the fifth ray. No evidence of osteomyelitis within the residual fifth metatarsal. Electronically Signed   By: Duanne Guess D.O.   On: 03/25/2023 11:07   DG Toe 4th Right  Result Date: 03/24/2023 CLINICAL DATA:  Evaluate for osteomyelitis. Patient complains of discoloration/darkening of the fourth toe. EXAM: RIGHT FOURTH TOE  COMPARISON:  None Available. FINDINGS: The AP and oblique images are suboptimal as the distal phalanx is in flexion and obscured by overlying osseous structures. No signs of acute fracture or dislocation. No focal bone erosions identified. There is diffuse soft tissue edema. No signs of acute fracture. IMPRESSION: 1. Suboptimal evaluation of the distal phalanx of the fourth toe. No signs of acute fracture, dislocation or bony erosion of osteomyelitis. 2. Diffuse soft tissue edema. Electronically Signed   By: Signa Kell M.D.   On: 03/24/2023 17:21   CT ANGIO AO+BIFEM W & OR WO CONTRAST  Result Date: 03/24/2023 CLINICAL DATA:  Discoloration/darkening of fourth toe. Evaluate for arterial dissection. EXAM: CT ANGIOGRAPHY OF ABDOMINAL AORTA WITH ILIOFEMORAL RUNOFF TECHNIQUE: Multidetector CT imaging of the abdomen, pelvis and lower extremities was performed using the standard protocol during bolus administration of intravenous contrast. Multiplanar CT image reconstructions and MIPs were  obtained to evaluate the vascular anatomy. RADIATION DOSE REDUCTION: This exam was performed according to the departmental dose-optimization program which includes automated exposure control, adjustment of the mA and/or kV according to patient size and/or use of iterative reconstruction technique. CONTRAST:  OMNIPAQUE IOHEXOL 350 MG/ML SOLN COMPARISON:  None Available. FINDINGS: VASCULAR Aorta: Normal caliber aorta without aneurysm, dissection, vasculitis or significant stenosis. Mild aortic atherosclerotic calcifications. Celiac: Patent without evidence of aneurysm, dissection, vasculitis or significant stenosis. SMA: Patent without evidence of aneurysm, dissection, vasculitis or significant stenosis. Renals: Both renal arteries are patent without evidence of aneurysm, dissection, vasculitis, fibromuscular dysplasia or significant stenosis. IMA: Patent without evidence of aneurysm, dissection, vasculitis or significant  stenosis. RIGHT Lower Extremity Inflow: Common, internal and external iliac arteries are patent without evidence of aneurysm, dissection, vasculitis or significant stenosis. Outflow: Common, superficial and profunda femoral arteries and the popliteal artery are patent without evidence of aneurysm, dissection, vasculitis or significant stenosis. Runoff: Patent three vessel runoff to the ankle. LEFT Lower Extremity Inflow: Common, internal and external iliac arteries are patent without evidence of aneurysm, dissection, vasculitis or significant stenosis. Outflow: Common, superficial and profunda femoral arteries and the popliteal artery are patent without evidence of aneurysm, dissection, vasculitis or significant stenosis. Runoff: Patent three vessel runoff to the ankle. Veins: No obvious venous abnormality within the limitations of this arterial phase study. Review of the MIP images confirms the above findings. NON-VASCULAR Lower chest: No acute abnormality. Hepatobiliary: No focal liver abnormality is seen. Status post cholecystectomy. No biliary dilatation. Pancreas: Unremarkable. No pancreatic ductal dilatation or surrounding inflammatory changes. Spleen: Normal in size without focal abnormality. Adrenals/Urinary Tract: Adrenal glands are unremarkable. Kidneys are normal, without renal calculi, focal lesion, or hydronephrosis. Bladder is unremarkable. Stomach/Bowel: Stomach is within normal limits. Appendix appears normal. No evidence of bowel wall thickening, distention, or inflammatory changes. Lymphatic: No enlarged abdominopelvic lymph nodes. Reproductive: Uterus and bilateral adnexa are unremarkable. Other: No free fluid or fluid collections. Musculoskeletal: No acute or significant osseous findings. IMPRESSION: 1. No evidence of arterial dissection or significant stenosis. 2. Non-vascular. No acute abdominopelvic abnormality. 3. Aortic Atherosclerosis (ICD10-I70.0). Electronically Signed   By: Signa Kell M.D.   On: 03/24/2023 15:55    Labs: Recent Labs    03/24/23 1406 03/24/23 1415 03/25/23 0444  WBC 11.8*  --  9.4  RBC 4.96  --  4.71  HCT 46.1* 46.0 44.6  PLT 256  --  259   Recent Labs    03/24/23 1406 03/24/23 1415 03/25/23 0444  NA 137 138 135  K 3.6 3.7 3.3*  CL 101 101 101  CO2 24  --  22  BUN 13 11 13   CREATININE 0.59 0.50 0.56  GLUCOSE 134* 133* 99  CALCIUM 9.1  --  8.5*   No results for input(s): "LABPT", "INR" in the last 72 hours.  Review of Systems: ROS as detailed in HPI  Physical Exam: Body mass index is 36.79 kg/m.  Physical Exam   Gen: AAOx3, NAD Comfortable at rest  Right Lower Extremity: Skin intact, right 4th toe tip dusky appearance with woody edema, no drainage TTP over tip of right 4th toe Well healed scar from prior 5th ray amp ADF/APF/EHL 5/5 SILT throughout DP, PT 2+ to palp CR < 2s   Assessment and Plan: Ortho consult for right 4th toe osteomyelitis of distal phalanx with cutaneous blistering x  1 day  -history, exam and imaging reviewed at length with patient/family -advise emergent right 4th  toe amputation (thru proximal phalanx or MTP disarticulation) -pt already on IV abx from [facility] -will need prolonged IV abx postop -PT/OT postop  Netta Cedars, MD Orthopaedic Surgeon [facility] [phone]  The risks and benefits were presented and reviewed. The risks due to recurrent/new/persistent infection, stiffness, nerve/vessel/tendon injury, nonunion/malunion, wound healing issues, allograft usage, development of arthritis, failure of this surgery, possibility of delayed definitive surgery, need for further surgery, thromboembolic events, anesthesia/medical complications, amputation, death among others were discussed. The patient acknowledged the explanation, agreed to proceed with the plan and a consent was signed.

## 2023-03-25 NOTE — Anesthesia Preprocedure Evaluation (Addendum)
Anesthesia Evaluation  Patient identified by MRN, date of birth, ID band Patient awake    Reviewed: Allergy & Precautions, NPO status , Patient's Chart, lab work & pertinent test results  History of Anesthesia Complications Negative for: history of anesthetic complications  Airway Mallampati: III   Neck ROM: Full  Mouth opening: Limited Mouth Opening  Dental no notable dental hx.    Pulmonary    breath sounds clear to auscultation       Cardiovascular hypertension, (-) angina (-) CAD, (-) Past MI and (-) Cardiac Stents (-) Valvular Problems/Murmurs Rhythm:Regular     Neuro/Psych  PSYCHIATRIC DISORDERS  Depression    negative neurological ROS     GI/Hepatic ,GERD  Medicated,,  Endo/Other  diabetes, Poorly Controlled, Type 2    Renal/GU Lab Results      Component                Value               Date                      NA                       135                 03/25/2023                CL                       101                 03/25/2023                K                        3.3 (L)             03/25/2023                CO2                      22                  03/25/2023                BUN                      13                  03/25/2023                CREATININE               0.56                03/25/2023                GFRNONAA                 >60                 03/25/2023                CALCIUM                  8.5 (L)  03/25/2023                ALBUMIN                  4.5                 03/24/2023                GLUCOSE                  99                  03/25/2023                Musculoskeletal   Abdominal   Peds  Hematology Lab Results      Component                Value               Date                      WBC                      9.4                 03/25/2023                HGB                      14.3                03/25/2023                HCT                       44.6                03/25/2023                MCV                      94.7                03/25/2023                PLT                      259                 03/25/2023              Anesthesia Other Findings   Reproductive/Obstetrics                             Anesthesia Physical Anesthesia Plan  ASA: 3  Anesthesia Plan: MAC   Post-op Pain Management: Minimal or no pain anticipated   Induction: Intravenous  PONV Risk Score and Plan: 2 and Treatment may vary due to age or medical condition and Propofol infusion  Airway Management Planned: Natural Airway and Nasal Cannula  Additional Equipment: None  Intra-op Plan:   Post-operative Plan:   Informed Consent:      Dental advisory given  Plan Discussed with: CRNA and Surgeon  Anesthesia Plan Comments:        Anesthesia Quick Evaluation

## 2023-03-25 NOTE — Consult Note (Signed)
[facility]    Date of Admission:  03/24/2023   Total days of inpatient antibiotics 1        Reason for Consult: Osteomyelitis    Principal Problem:   Osteomyelitis of toe of right foot [facility])   Assessment: 56 YF admitted with:   #Right 4th metatarsal osteomyelitis #Hx of right 5th tow soteo EP amputation Cx+ MSSA -Back in 10/23 patient had right lateral foot gangrene with fifth toe osteomyelitis, underwent right fifth ray amputation on 1014/23 with Dr. Odis Hollingshead.  Followed by infectious disease, received 2 weeks of p.o. antibiotics for, pathology noted good margins.  Cultures at that time grew MSSA, treated with linezolid. -Patient reports wound healed well following amputation back in October 2023. - She states she went to [facility] and was in the ocean and pool, returned last week.  Denies using hot tub.  She states that she felt well but fatigued after return.  She noted that on Friday she had some right groin pain and noticed erythema around the right foot, fourth toe erythema now has progressed.  Tip of toe appears gangrenous.  Ortho engaged plan in OR for emergent fourth toe amputation. Recommendations:  -Follow OR Cx and path -D/C metronidazole -Continue vancomycin and cefepime - Follow blood cultures  #Diabetes mellitus-poorly controlled - A1c 8.6 ion 8/5 Microbiology:   Antibiotics: Vnaomcyin, metro and cefepime  Cultures: Blood 8/4 pending  HPI: Donna [name] is a 57 y.o. female with past medical history of poorly controlled diabetes, obesity, hypertension, hyperlipidemia, prior right toe amputation with cultures growing MSSA in October 2023 admitted for a right fourth toe cellulitis noted discomfort in right groin date of admission, streaking redness of the right foot.  On arrival WBC 11.8 K, vital stable.  Started on empiric vancomycin then cefepime and metronidazole were added.  MRI right foot showed acute osteomyelitis of  the distal thigh and right fourth toe, soft tissue swelling of the fourth toe suspected cutaneous blistering.  Interval met transmetatarsal amputation of fifth ray, no evidence of residual vestibulitis.  Orthopedics were as engaged, plan on emergent fourth toe amputation   Review of Systems: Review of Systems  All other systems reviewed and are negative.   Past Medical History:  Diagnosis Date   Diabetes mellitus    type 2- uncontrolled   Hyperlipidemia    Hypertension    Lumbar disc disease    Obesity     Social History   Tobacco Use   Smoking status: Never   Smokeless tobacco: Never  Substance Use Topics   Alcohol use: No   Drug use: No    Family History  Problem Relation Age of Onset   Other Father        CHF   Coronary artery disease Father    Cancer Father        renal cell carcinoma   Hypertension Father    Diabetes Father    Diabetes Mother    Coronary artery disease Mother    Heart attack Mother    Cancer Paternal Aunt        Breast   Breast cancer Paternal Aunt 35   Atrial fibrillation Other    Peripheral vascular disease Other    Heart disease Other    Scheduled Meds:  atorvastatin  80 mg Oral Daily   enoxaparin (LOVENOX) injection  40 mg Subcutaneous Q24H   gabapentin  300 mg Oral QID  insulin aspart  0-15 Units Subcutaneous TID WC   insulin aspart  0-5 Units Subcutaneous QHS   insulin glargine-yfgn  40 Units Subcutaneous QHS   mupirocin ointment  1 Application Nasal BID   sertraline  100 mg Oral Daily   Continuous Infusions:  0.9 % NaCl with KCl 20 mEq / L     ceFEPime (MAXIPIME) IV 2 g (03/25/23 1223)   metronidazole 500 mg (03/25/23 1040)   vancomycin 1,750 mg (03/25/23 1035)   PRN Meds:.acetaminophen **OR** acetaminophen, albuterol, ondansetron **OR** ondansetron (ZOFRAN) IV, oxyCODONE, traZODone No Known Allergies  OBJECTIVE: Blood pressure 116/63, pulse 80, temperature 98.2 F (36.8 C), temperature source Oral, resp. rate 18,  height 5\' 6"  (1.676 m), weight 103.4 kg, last menstrual period 09/27/2011, SpO2 96%.  Physical Exam Constitutional:      Appearance: Normal appearance.  HENT:     Head: Normocephalic and atraumatic.     Right Ear: Tympanic membrane normal.     Left Ear: Tympanic membrane normal.     Nose: Nose normal.     Mouth/Throat:     Mouth: Mucous membranes are moist.  Eyes:     Extraocular Movements: Extraocular movements intact.     Conjunctiva/sclera: Conjunctivae normal.     Pupils: Pupils are equal, round, and reactive to light.  Cardiovascular:     Rate and Rhythm: Normal rate and regular rhythm.     Heart sounds: No murmur heard.    No friction rub. No gallop.  Pulmonary:     Effort: Pulmonary effort is normal.     Breath sounds: Normal breath sounds.  Abdominal:     General: Abdomen is flat.     Palpations: Abdomen is soft.  Skin:    General: Skin is warm and dry.  Neurological:     General: No focal deficit present.     Mental Status: She is alert and oriented to person, place, and time.  Psychiatric:        Mood and Affect: Mood normal.     Lab Results Lab Results  Component Value Date   WBC 9.4 03/25/2023   HGB 14.3 03/25/2023   HCT 44.6 03/25/2023   MCV 94.7 03/25/2023   PLT 259 03/25/2023    Lab Results  Component Value Date   CREATININE 0.56 03/25/2023   BUN 13 03/25/2023   NA 135 03/25/2023   K 3.3 (L) 03/25/2023   CL 101 03/25/2023   CO2 22 03/25/2023    Lab Results  Component Value Date   ALT 27 03/24/2023   AST 17 03/24/2023   ALKPHOS 73 03/24/2023   BILITOT 1.0 03/24/2023       Danelle Earthly, MD [facility] 03/25/2023, 3:02 PM   I have personally spent 82 minutes involved in face-to-face and non-face-to-face activities for this patient on the day of the visit. Professional time spent includes the following activities: Preparing to see the patient (review of tests), Obtaining and/or  reviewing separately obtained history (admission/discharge record), Performing a medically appropriate examination and/or evaluation , Ordering medications/tests/procedures, referring and communicating with other health care professionals, Documenting clinical information in the EMR, Independently interpreting results (not separately reported), Communicating results to the patient/family/caregiver, Counseling and educating the patient/family/caregiver and Care coordination (not separately reported).

## 2023-03-25 NOTE — Progress Notes (Signed)
Right 4th toe still black, more swollen, ?? forming blister, denies any pain.

## 2023-03-25 NOTE — H&P (Signed)
H&P Update:  -History and Physical Reviewed  -Patient has been re-examined  -No change in the plan of care  -The risks and benefits were presented and reviewed. The risks due to new/persistent infection, stiffness, nerve/vessel/tendon injury or rerupture of repaired tendon, nonunion/malunion, allograft usage, wound healing issues, development of arthritis, failure of this surgery, possibility of delayed definitive surgery, need for further surgery, thromboembolic events, anesthesia/medical complications, amputation, death among others were discussed. The patient acknowledged the explanation, agreed to proceed with the plan and a consent was signed.  Donna [name]

## 2023-03-26 ENCOUNTER — Inpatient Hospital Stay (HOSPITAL_COMMUNITY): Payer: Commercial Managed Care - PPO | Admitting: Anesthesiology

## 2023-03-26 ENCOUNTER — Encounter (HOSPITAL_COMMUNITY): Payer: Self-pay | Admitting: Internal Medicine

## 2023-03-26 ENCOUNTER — Encounter (HOSPITAL_COMMUNITY)
Admission: EM | Disposition: A | Discharge status: Home / Self Care | Payer: Self-pay | Source: Home / Self Care | Attending: Internal Medicine

## 2023-03-26 DIAGNOSIS — E785 Hyperlipidemia, unspecified: Secondary | ICD-10-CM

## 2023-03-26 DIAGNOSIS — E1169 Type 2 diabetes mellitus with other specified complication: Secondary | ICD-10-CM

## 2023-03-26 DIAGNOSIS — I10 Essential (primary) hypertension: Secondary | ICD-10-CM

## 2023-03-26 DIAGNOSIS — M869 Osteomyelitis, unspecified: Secondary | ICD-10-CM

## 2023-03-26 DIAGNOSIS — M86172 Other acute osteomyelitis, left ankle and foot: Secondary | ICD-10-CM | POA: Diagnosis not present

## 2023-03-26 HISTORY — PX: AMPUTATION: SHX166

## 2023-03-26 LAB — GLUCOSE, CAPILLARY
Glucose-Capillary: 83 mg/dL (ref 70–99)
Glucose-Capillary: 98 mg/dL (ref 70–99)
Glucose-Capillary: 103 mg/dL — ABNORMAL HIGH (ref 70–99)
Glucose-Capillary: 110 mg/dL — ABNORMAL HIGH (ref 70–99)
Glucose-Capillary: 110 mg/dL — ABNORMAL HIGH (ref 70–99)
Glucose-Capillary: 199 mg/dL — ABNORMAL HIGH (ref 70–99)

## 2023-03-26 LAB — BASIC METABOLIC PANEL WITH GFR
Anion gap: 10 (ref 5–15)
BUN: 20 mg/dL (ref 6–20)
CO2: 23 mmol/L (ref 22–32)
Calcium: 8.5 mg/dL — ABNORMAL LOW (ref 8.9–10.3)
Chloride: 104 mmol/L (ref 98–111)
Creatinine, Ser: 0.48 mg/dL (ref 0.44–1.00)
GFR, Estimated: >60 mL/min
Glucose, Bld: 82 mg/dL (ref 70–99)
Potassium: 3.2 mmol/L — ABNORMAL LOW (ref 3.5–5.1)
Sodium: 137 mmol/L (ref 135–145)

## 2023-03-26 LAB — AEROBIC/ANAEROBIC CULTURE W GRAM STAIN (SURGICAL/DEEP WOUND)

## 2023-03-26 SURGERY — AMPUTATION DIGIT
Anesthesia: Monitor Anesthesia Care | Laterality: Right

## 2023-03-26 MED ORDER — INSULIN ASPART 100 UNIT/ML IJ SOLN: 0.0000 [IU] | INTRAMUSCULAR | Status: DC | PRN

## 2023-03-26 MED ORDER — ONDANSETRON HCL 4 MG/2ML IJ SOLN
INTRAMUSCULAR | Status: DC | PRN
  Administered 2023-03-26: 4 mg via INTRAVENOUS

## 2023-03-26 MED ORDER — FENTANYL CITRATE (PF) 100 MCG/2ML IJ SOLN
INTRAMUSCULAR | Status: AC
  Filled 2023-03-26: qty 2

## 2023-03-26 MED ORDER — VANCOMYCIN HCL 1000 MG IV SOLR
INTRAVENOUS | Status: DC | PRN
  Administered 2023-03-26: 1000 mg via TOPICAL

## 2023-03-26 MED ORDER — ONDANSETRON HCL 4 MG/2ML IJ SOLN: 4.0000 mg | Freq: Once | INTRAMUSCULAR | Status: DC | PRN

## 2023-03-26 MED ORDER — AMISULPRIDE (ANTIEMETIC) 5 MG/2ML IV SOLN: 10.0000 mg | Freq: Once | INTRAVENOUS | Status: DC | PRN

## 2023-03-26 MED ORDER — MIDAZOLAM HCL 2 MG/2ML IJ SOLN
INTRAMUSCULAR | Status: AC
  Filled 2023-03-26: qty 2

## 2023-03-26 MED ORDER — OXYCODONE HCL 5 MG PO TABS: 5.0000 mg | ORAL_TABLET | Freq: Once | ORAL | Status: DC | PRN

## 2023-03-26 MED ORDER — POTASSIUM CHLORIDE CRYS ER 20 MEQ PO TBCR
40.0000 meq | EXTENDED_RELEASE_TABLET | Freq: Two times a day (BID) | ORAL | Status: AC
  Administered 2023-03-26 (×2): 40 meq via ORAL
  Filled 2023-03-26 (×2): qty 2

## 2023-03-26 MED ORDER — CEFAZOLIN SODIUM 1 G IJ SOLR
INTRAMUSCULAR | Status: AC
  Filled 2023-03-26: qty 10

## 2023-03-26 MED ORDER — MIDAZOLAM HCL 5 MG/5ML IJ SOLN
INTRAMUSCULAR | Status: DC | PRN
  Administered 2023-03-26: 2 mg via INTRAVENOUS

## 2023-03-26 MED ORDER — ACETAMINOPHEN 10 MG/ML IV SOLN: 1000.0000 mg | Freq: Once | INTRAVENOUS | Status: DC | PRN

## 2023-03-26 MED ORDER — BUPIVACAINE HCL 0.25 % IJ SOLN
INTRAMUSCULAR | Status: AC
  Filled 2023-03-26: qty 1

## 2023-03-26 MED ORDER — FENTANYL CITRATE (PF) 100 MCG/2ML IJ SOLN
INTRAMUSCULAR | Status: DC | PRN
  Administered 2023-03-26 (×4): 25 ug via INTRAVENOUS

## 2023-03-26 MED ORDER — CEFAZOLIN SODIUM-DEXTROSE 1-4 GM/50ML-% IV SOLN
INTRAVENOUS | Status: DC | PRN
  Administered 2023-03-26: 1 g via INTRAVENOUS

## 2023-03-26 MED ORDER — ONDANSETRON HCL 4 MG/2ML IJ SOLN
INTRAMUSCULAR | Status: AC
  Filled 2023-03-26: qty 2

## 2023-03-26 MED ORDER — PROPOFOL 500 MG/50ML IV EMUL
INTRAVENOUS | Status: DC | PRN
  Administered 2023-03-26: 85 ug/kg/min via INTRAVENOUS

## 2023-03-26 MED ORDER — PROPOFOL 500 MG/50ML IV EMUL
INTRAVENOUS | Status: AC
  Filled 2023-03-26: qty 50

## 2023-03-26 MED ORDER — PROPOFOL 10 MG/ML IV BOLUS
INTRAVENOUS | Status: DC | PRN
  Administered 2023-03-26: 40 mg via INTRAVENOUS
  Administered 2023-03-26: 20 mg via INTRAVENOUS
  Administered 2023-03-26: 50 mg via INTRAVENOUS
  Administered 2023-03-26: 10 mg via INTRAVENOUS
  Administered 2023-03-26: 20 mg via INTRAVENOUS

## 2023-03-26 MED ORDER — HYDROMORPHONE HCL 1 MG/ML IJ SOLN: 0.2500 mg | INTRAMUSCULAR | Status: DC | PRN

## 2023-03-26 MED ORDER — OXYCODONE HCL 5 MG/5ML PO SOLN: 5.0000 mg | Freq: Once | ORAL | Status: DC | PRN

## 2023-03-26 MED ORDER — SODIUM CHLORIDE 0.9 % IR SOLN
Status: DC | PRN
  Administered 2023-03-26: 3000 mL

## 2023-03-26 MED ORDER — VANCOMYCIN HCL 1000 MG IV SOLR
INTRAVENOUS | Status: AC
  Filled 2023-03-26: qty 20

## 2023-03-26 MED ORDER — LACTATED RINGERS IV SOLN: INTRAVENOUS | Status: DC | PRN

## 2023-03-26 SURGICAL SUPPLY — 58 items
APL PRP STRL LF DISP 70% ISPRP (MISCELLANEOUS) ×2
BAG COUNTER SPONGE SURGICOUNT (BAG) IMPLANT
BAG SPEC THK2 15X12 ZIP CLS (MISCELLANEOUS) ×1
BAG SPNG CNTER NS LX DISP (BAG)
BAG ZIPLOCK 12X15 (MISCELLANEOUS) ×2 IMPLANT
BLADE OSCILLATING/SAGITTAL (BLADE)
BLADE SURG 15 STRL LF DISP TIS (BLADE) ×6 IMPLANT
BLADE SURG 15 STRL SS (BLADE) ×3
BLADE SW THK.38XMED LNG THN (BLADE) IMPLANT
BNDG CMPR 5X4 CHSV STRCH STRL (GAUZE/BANDAGES/DRESSINGS) ×1
BNDG CMPR 5X4 KNIT ELC UNQ LF (GAUZE/BANDAGES/DRESSINGS) ×1
BNDG CMPR 9X4 STRL LF SNTH (GAUZE/BANDAGES/DRESSINGS) ×1
BNDG COHESIVE 4X5 TAN STRL LF (GAUZE/BANDAGES/DRESSINGS) ×2 IMPLANT
BNDG ELASTIC 4INX 5YD STR LF (GAUZE/BANDAGES/DRESSINGS) IMPLANT
BNDG ESMARK 4X9 LF (GAUZE/BANDAGES/DRESSINGS) ×2 IMPLANT
BNDG GAUZE DERMACEA FLUFF 4 (GAUZE/BANDAGES/DRESSINGS) ×2 IMPLANT
BNDG GZE 12X3 1 PLY HI ABS (GAUZE/BANDAGES/DRESSINGS) ×1
BNDG GZE DERMACEA 4 6PLY (GAUZE/BANDAGES/DRESSINGS) ×1
BNDG STRETCH GAUZE 3IN X12FT (GAUZE/BANDAGES/DRESSINGS) ×2 IMPLANT
CHLORAPREP W/TINT 26 (MISCELLANEOUS) ×4 IMPLANT
CNTNR URN SCR LID CUP LEK RST (MISCELLANEOUS) IMPLANT
CONT SPEC 4OZ STRL OR WHT (MISCELLANEOUS)
COVER SURGICAL LIGHT HANDLE (MISCELLANEOUS) ×2 IMPLANT
CUFF TOURN SGL QUICK 34 (TOURNIQUET CUFF)
CUFF TRNQT CYL 34X4.125X (TOURNIQUET CUFF) IMPLANT
DRAPE C-ARM 42X120 X-RAY (DRAPES) IMPLANT
DRAPE EXTREMITY T 121X128X90 (DISPOSABLE) ×2 IMPLANT
DRAPE OEC MINIVIEW 54X84 (DRAPES) ×2 IMPLANT
DRAPE SHEET LG 3/4 BI-LAMINATE (DRAPES) ×2 IMPLANT
DRAPE SURG 17X11 SM STRL (DRAPES) ×4 IMPLANT
DRAPE U-SHAPE 47X51 STRL (DRAPES) ×4 IMPLANT
DRSG ADAPTIC 3X8 NADH LF (GAUZE/BANDAGES/DRESSINGS) ×2 IMPLANT
DRSG EMULSION OIL 3X3 NADH (GAUZE/BANDAGES/DRESSINGS) ×2 IMPLANT
ELECT PENCIL ROCKER SW 15FT (MISCELLANEOUS) ×2 IMPLANT
ELECT REM PT RETURN 15FT ADLT (MISCELLANEOUS) ×2 IMPLANT
GAUZE PAD ABD 8X10 STRL (GAUZE/BANDAGES/DRESSINGS) ×2 IMPLANT
GAUZE SPONGE 4X4 12PLY STRL (GAUZE/BANDAGES/DRESSINGS) ×2 IMPLANT
GLOVE BIO SURGEON STRL SZ8 (GLOVE) ×2 IMPLANT
GLOVE BIOGEL PI IND STRL 7.5 (GLOVE) ×4 IMPLANT
GLOVE BIOGEL PI IND STRL 8 (GLOVE) ×2 IMPLANT
GLOVE SS BIOGEL STRL SZ 7.5 (GLOVE) ×2 IMPLANT
GOWN STRL REUS W/ TWL LRG LVL3 (GOWN DISPOSABLE) ×2 IMPLANT
GOWN STRL REUS W/TWL LRG LVL3 (GOWN DISPOSABLE) ×1
KIT BASIN OR (CUSTOM PROCEDURE TRAY) ×2 IMPLANT
KIT TURNOVER KIT A (KITS) IMPLANT
MARKER SKIN DUAL TIP RULER LAB (MISCELLANEOUS) ×2 IMPLANT
NS IRRIG 1000ML POUR BTL (IV SOLUTION) ×2 IMPLANT
PACK GENERAL/GYN (CUSTOM PROCEDURE TRAY) ×2 IMPLANT
PAD CAST 4YDX4 CTTN HI CHSV (CAST SUPPLIES) ×2 IMPLANT
PADDING CAST COTTON 4X4 STRL (CAST SUPPLIES) ×1
PROTECTOR NERVE ULNAR (MISCELLANEOUS) ×4 IMPLANT
SET IRRIG Y TYPE TUR BLADDER L (SET/KITS/TRAYS/PACK) ×2 IMPLANT
STOCKINETTE 8 INCH (MISCELLANEOUS) ×2 IMPLANT
SUT ETHILON 3 0 PS 1 (SUTURE) ×2 IMPLANT
SUT PDS AB 2-0 CT2 27 (SUTURE) IMPLANT
SUT PDS AB 3-0 PS2 18 (SUTURE) IMPLANT
SUT PDS AB 3-0 SH 27 (SUTURE) IMPLANT
WATER STERILE IRR 1000ML POUR (IV SOLUTION) ×2 IMPLANT

## 2023-03-26 NOTE — Op Note (Signed)
03/26/2023  8:40 AM   PATIENT: Donna [name]  57 y.o. female  MRN: [id number]   PRE-OPERATIVE DIAGNOSIS:   Right foot 4th toe osteomyelitis   POST-OPERATIVE DIAGNOSIS:   Same   PROCEDURE: Right 4th toe amputation through proximal phalanx   SURGEON:  Netta Cedars, MD   ASSISTANT: None   ANESTHESIA: General, regional   EBL: Minimal   TOURNIQUET:   None used   COMPLICATIONS: None apparent   DISPOSITION: Extubated, awake and stable to recovery.   INDICATION FOR PROCEDURE: The patient presented with above diagnosis.  We discussed the diagnosis, alternative treatment options, risks and benefits of the above surgical intervention, as well as alternative non-operative treatments. All questions/concerns were addressed and the patient/family demonstrated appropriate understanding of the diagnosis, the procedure, the postoperative course, and overall prognosis. The patient wished to proceed with surgical intervention and signed an informed surgical consent as such, in each others presence prior to surgery.   PROCEDURE IN DETAIL: After preoperative consent was obtained and the correct operative site was identified, the patient was brought to the operating room supine on stretcher and transferred onto operating table. MAC anesthesia was induced. Preoperative antibiotics were administered. Surgical timeout was taken. The patient was then positioned supine with an ipsilateral hip bump. The operative lower extremity was prepped and draped in standard sterile fashion.  A racquet incision was performed around the base of the 4th toe and dissection carried down to the level of the bone. A pair of bone cutters were used to resect the toe thru the proximal phalanx. This was sent for pathology and microbiology analysis. The remaining bone was smoothened to avoid any prominences.   The surgical site was thoroughly irrigated with several liters of saline. Hemostasis achieved.  Betadine and vancomycin powder were applied. The skin was loosely approximated without tension using 2-0 prolene suture.    The leg was cleaned with saline and sterile dressings with gauze were applied. A well padded loosely applied wrap was applied. The patient was awakened from anesthesia and transported to the recovery room in stable condition.    FOLLOW UP PLAN: -transfer to PACU, then return to [facility] under Medicine -strict heel weightbearing operative extremity, maximum elevation -dressing changes daily by bedside nursing -DVT ppx: Aspirin 81 mg twice daily or per primary team -IV abx per primary team/ID -optimize glycemic control -trend intra-op cx -follow up as outpatient within 7-10 days for wound check -sutures out in 2-3 weeks in outpatient office   RADIOGRAPHS: AP, lateral, oblique radiographs of the right foot were obtained intraoperatively. These showed interval resection of the 4th toe thru proximal phalanx. No other acute injuries are noted.   Netta Cedars [facility]opa[facility]

## 2023-03-26 NOTE — Anesthesia Procedure Notes (Addendum)
Procedure Name: MAC Date/Time: 03/26/2023 7:07 AM  Performed by: Maurene Capes, CRNAPre-anesthesia Checklist: Patient identified, Emergency Drugs available, Suction available and Patient being monitored Patient Re-evaluated:Patient Re-evaluated prior to induction Oxygen Delivery Method: Simple face mask Induction Type: IV induction Placement Confirmation: positive ETCO2 Dental Injury: Teeth and Oropharynx as per pre-operative assessment

## 2023-03-26 NOTE — Progress Notes (Signed)
Report received Phillis Knack RN. Patient's assessment without changes at this time.

## 2023-03-26 NOTE — Progress Notes (Signed)
Patient updated on plan of surgery. Per OR nurse, Patient will be picked up at 5;30AM  to prep for 7am surgery.

## 2023-03-26 NOTE — Progress Notes (Signed)
Subjective: Day of Surgery Procedure(s) (LRB): RIGHT 4TH TOE AMPUTATION (Right)  Patient has been NPO for surgery today. No interim changes overnight.  Objective:   VITALS:  Temp:  [97.5 F (36.4 C)-98.6 F (37 C)] 97.6 F (36.4 C) (08/06 0806) Pulse Rate:  [70-93] 76 (08/06 0830) Resp:  [12-18] 15 (08/06 0830) BP: (116-143)/(63-86) 127/70 (08/06 0830) SpO2:  [95 %-98 %] 96 % (08/06 0830)  Gen: AAOx3, NAD Comfortable at rest   Right Lower Extremity: Skin intact, right 4th toe tip dusky appearance with woody edema, no drainage TTP over tip of right 4th toe Well healed scar from prior 5th ray amp ADF/APF/EHL 5/5 SILT throughout DP, PT 2+ to palp CR < 2s   LABS Recent Labs    03/24/23 1406 03/24/23 1415 03/25/23 0444 03/26/23 0501  HGB 15.3* 15.6* 14.3 13.3  WBC 11.8*  --  9.4 6.8  PLT 256  --  259 235   Recent Labs    03/25/23 0444 03/26/23 0501  NA 135 137  K 3.3* 3.2*  CL 101 104  CO2 22 23  BUN 13 20  CREATININE 0.56 0.48  GLUCOSE 99 82   No results for input(s): "LABPT", "INR" in the last 72 hours.   Assessment/Plan: Day of Surgery Procedure(s) (LRB): RIGHT 4TH TOE AMPUTATION (Right) -patient ready for emergent right 4th toe amputation (thru proximal phalanx or MTP disarticulation) -pt already on IV abx from RNF -will need prolonged IV abx postop -PT/OT postop  Netta Cedars 03/26/2023, 8:45 AM

## 2023-03-26 NOTE — Progress Notes (Signed)
Orthopedic Tech Progress Note Patient Details:  BRANDT PORTZ [birth year] [id number]  Ortho Devices Type of Ortho Device: Darco shoe Ortho Device/Splint Location: right Ortho Device/Splint Interventions: Ordered, Application, Adjustment   Post Interventions Patient Tolerated: Well Instructions Provided: Care of device, Adjustment of device  Kizzie Fantasia 03/26/2023, 9:22 AM

## 2023-03-26 NOTE — TOC Initial Note (Signed)
Transition of Care [facility]) - Initial/Assessment Note    Patient Details  Name: Donna [name] MRN: [id number] Date of Birth: Mar 21, 1966  [facility] CM/SW Contact:    Lanier Clam, RN Phone Number: 03/26/2023, 3:12 PM  Clinical Narrative: Will follow for d/c plans.                  Expected Discharge Plan: Home/Self Care (Home) Barriers to Discharge: Continued Medical Work up   Patient Goals and CMS Choice Patient states their goals for this hospitalization and ongoing recovery are:: Home          Expected Discharge Plan and Services   Discharge Planning Services: CM Consult   Living arrangements for the past 2 months: Single Family Home                                      Prior Living Arrangements/Services Living arrangements for the past 2 months: Single Family Home Lives with:: Self (has family support) Patient language and need for interpreter reviewed:: Yes Do you feel safe going back to the place where you live?: Yes      Need for Family Participation in Patient Care: Yes (Comment) Care giver support system in place?: Yes (comment) Current home services: DME (rw,knee scooter,shower chair) Criminal Activity/Legal Involvement Pertinent to Current Situation/Hospitalization: No - Comment as needed  Activities of Daily Living Home Assistive Devices/Equipment: None ADL Screening (condition at time of admission) Patient's cognitive ability adequate to safely complete daily activities?: Yes Is the patient deaf or have difficulty hearing?: No Does the patient have difficulty seeing, even when wearing glasses/contacts?: No Does the patient have difficulty concentrating, remembering, or making decisions?: No Patient able to express need for assistance with ADLs?: No Does the patient have difficulty dressing or bathing?: No Independently performs ADLs?: Yes (appropriate for developmental age) Does the patient have difficulty walking or climbing  stairs?: No Weakness of Legs: None Weakness of Arms/Hands: None  Permission Sought/Granted Permission sought to share information with : Case Manager Permission granted to share information with : Yes, Verbal Permission Granted  Share Information with NAME: Case manager           Emotional Assessment Appearance:: Appears stated age Attitude/Demeanor/Rapport: Gracious Affect (typically observed): Accepting Orientation: : Oriented to Self, Oriented to Place, Oriented to  Time, Oriented to Situation Alcohol / Substance Use: Not Applicable Psych Involvement: No (comment)  Admission diagnosis:  Osteomyelitis of toe of right foot (HCC) [M86.9] Cellulitis of right lower extremity [L03.115] Patient Active Problem List   Diagnosis Date Noted   Osteomyelitis of toe of right foot (HCC) 03/24/2023   Medication monitoring encounter 06/13/2022   Gangrene of right foot (HCC) 06/12/2022   Cellulitis of right foot 06/03/2022   Osteomyelitis of fifth toe of right foot (HCC) 06/02/2022   Toenail deformity 09/01/2021   Mouth ulcer 02/28/2021   Right leg pain 06/15/2020   Pre-ulcerative calluses 06/09/2020   Posterior calcaneal exostosis 07/13/2019   Diabetic neuropathy (HCC) 12/01/2014   Routine general medical examination at a health care facility 07/18/2013   Restless leg syndrome 06/02/2012   Diabetes mellitus type 2 with complications (HCC) 08/22/2010   Hyperlipidemia associated with type 2 diabetes mellitus (HCC) 08/22/2010   Morbid obesity (HCC) 08/22/2010   Depression 08/22/2010   Essential hypertension 08/22/2010   GERD (gastroesophageal reflux disease) 08/22/2010   PCP:  Myrlene Broker, MD  Pharmacy:   [facility] [address] [place] [address] Phone: [phone] Fax: [phone]     Social Determinants of Health (SDOH) Social History: SDOH Screenings   Food Insecurity: No Food Insecurity (03/24/2023)  Housing: Low Risk   (03/24/2023)  Transportation Needs: No Transportation Needs (03/24/2023)  Utilities: Not At Risk (03/24/2023)  Depression (PHQ2-9): Low Risk  (06/12/2022)  Tobacco Use: Low Risk  (03/26/2023)   SDOH Interventions:     Readmission Risk Interventions     No data to display

## 2023-03-26 NOTE — Anesthesia Postprocedure Evaluation (Signed)
Anesthesia Post Note  Patient: Melika R Mccroskey  Procedure(s) Performed: RIGHT 4TH TOE AMPUTATION (Right)     Patient location during evaluation: PACU Anesthesia Type: MAC Level of consciousness: awake and alert Pain management: pain level controlled Vital Signs Assessment: post-procedure vital signs reviewed and stable Respiratory status: spontaneous breathing, nonlabored ventilation, respiratory function stable and patient connected to nasal cannula oxygen Cardiovascular status: stable and blood pressure returned to baseline Postop Assessment: no apparent nausea or vomiting Anesthetic complications: no   No notable events documented.  Last Vitals:  Vitals:   03/26/23 0830 03/26/23 0908  BP: 127/70 130/73  Pulse: 76 73  Resp: 15 16  Temp:  36.5 C  SpO2: 96% 98%    Last Pain:  Vitals:   03/26/23 0908  TempSrc: Oral  PainSc: 0-No pain                 Mariann Barter

## 2023-03-26 NOTE — Transfer of Care (Signed)
Immediate Anesthesia Transfer of Care Note  Patient: Totiana R Raine  Procedure(s) Performed: RIGHT 4TH TOE AMPUTATION (Right)  Patient Location: PACU  Anesthesia Type:MAC  Level of Consciousness: awake, alert , oriented, and patient cooperative  Airway & Oxygen Therapy: Patient Spontanous Breathing and Patient connected to face mask oxygen  Post-op Assessment: Report given to RN and Post -op Vital signs reviewed and stable  Post vital signs: Reviewed and stable  Last Vitals:  Vitals Value Taken Time  BP    Temp    Pulse 79 03/26/23 0806  Resp 21 03/26/23 0806  SpO2 100 % 03/26/23 0806  Vitals shown include unfiled device data.  Last Pain:  Vitals:   03/26/23 8413  TempSrc: Oral  PainSc: 0-No pain      Patients Stated Pain Goal: 2 (03/25/23 0950)  Complications: No notable events documented.

## 2023-03-26 NOTE — Progress Notes (Signed)
PROGRESS NOTE Donna [name]  ZOX:[id number] DOB: 1966/04/22 DOA: 03/24/2023 PCP: Myrlene Broker, MD  Brief Narrative/Hospital Course: 57 y.o. female with poorly controlled type 2 diabetes, obesity, hypertension, hyperlipidemia prior right fifth toe osteomyelitis and amputation almost 1 year ago, who is being admitted to the hospital with right fourth toe cellulitis and concern for osteomyelitis.  Patient endorses not having good sensation in her feet, started noticing some streaking redness in her right foot and right fourth toe was swollen erythematous with deep necrosis.  In the ED: Vitals overall stable labs with mild leukocytosis, CT angio of the abdomen and bilateral lower extremities without any obvious limiting lesions, given IV antibiotics and admitted for further management.  8/6, fourth toe amputation with clean margin through distal phalanx.   Subjective: Came back from procedure.  Denies any complaints.  Assessment and Plan: Principal Problem:   Osteomyelitis of toe of right foot (HCC)   Right fourth toe cellulitis  w/ acute osteomyelitis of the distal phalanx of right fourth toe Previous amputation of the right fifth ray: CT angio of the abdomen and bilateral lower extremities-NAD.  Infection appears rapidly progressing in the setting of poorly controlled diabetes mellitus with prior amputation last year. MRI positive for acute osteomyelitis. 8/6, fourth toe amputation through distal phalanx, closure of the wound.  Orthopedics recommended long-term IV antibiotics.  ID following.  Intraoperative cultures pending. Weightbearing through heel.  Poorly controlled type 2 diabetes: A1c 8.6.  At home on Tresiba 100 units daily, Jardiance, metformin.Blood sugar fairly controlled. continue current Semglee 40 units daily and SSI Changed to q4hr Lab Results  Component Value Date   HGBA1C 8.6 (H) 03/25/2023    Recent Labs  Lab 03/25/23 0444 03/25/23 0742 03/25/23 2047  03/25/23 2336 03/26/23 0342 03/26/23 0635 03/26/23 0807  GLUCAP  --    < > 216* 153* 83 98 103*  HGBA1C 8.6*  --   --   --   --   --   --    < > = values in this interval not displayed.    Hypertension: Stable, at home on Zestoretic-hold as bp ok today.  Hypokalemia: Replace further today    Depression: Cont her zoloft   Hyperlipidemia: Cont home lipitor   Peripheral neuropathy: Cont her Neurontin  DVT prophylaxis: enoxaparin (LOVENOX) injection 40 mg Start: 03/24/23 2200 SCDs Start: 03/24/23 1626 Code Status:   Code Status: Full Code Family Communication: none today  Patient status is: Immediate postop. Level of care: Med-Surg   Dispo: The patient is from: home            Anticipated disposition: Pending clinical improvement, likely home.  Objective: Vitals last 24 hrs: Vitals:   03/26/23 0806 03/26/23 0815 03/26/23 0830 03/26/23 0908  BP: (!) 143/86 124/76 127/70 130/73  Pulse: 78 76 76 73  Resp: 15 12 15 16   Temp: 97.6 F (36.4 C)   97.7 F (36.5 C)  TempSrc:    Oral  SpO2: 98% 96% 96% 98%  Weight:      Height:       Weight change:   Physical Examination: General: Looks comfortable. Cardiovascular: S1-S2 normal regular rhythm. Respiratory: Bilateral clear. Gastrointestinal: Soft.  Nontender. Ext: Right foot immediate postop on compression dressing. Neuro: Intact. Musculoskeletal: No deformities.  Postop right foot. Skin: Intact.   Picture on admission 8/4    Medications reviewed:  Scheduled Meds:  atorvastatin  80 mg Oral Daily   enoxaparin (LOVENOX) injection  40 mg Subcutaneous Q24H  gabapentin  300 mg Oral QID   insulin aspart  0-15 Units Subcutaneous TID WC   insulin aspart  0-5 Units Subcutaneous QHS   insulin glargine-yfgn  40 Units Subcutaneous QHS   mupirocin ointment  1 Application Nasal BID   potassium chloride  40 mEq Oral BID   sertraline  100 mg Oral Daily   Continuous Infusions:  0.9 % NaCl with KCl 20 mEq / L 50 mL/hr  at 03/26/23 0900   ceFEPime (MAXIPIME) IV 2 g (03/26/23 0346)   vancomycin 1,750 mg (03/26/23 0902)    Diet Order             Diet Carb Modified Fluid consistency: Thin; Room service appropriate? Yes  Diet effective now                  Intake/Output Summary (Last 24 hours) at 03/26/2023 1100 Last data filed at 03/26/2023 0802 Gross per 24 hour  Intake 934.24 ml  Output 4 ml  Net 930.24 ml   Net IO Since Admission: 1,505.83 mL [03/26/23 1100]  Wt Readings from Last 3 Encounters:  03/24/23 103.4 kg  06/12/22 100.9 kg  06/02/22 101.2 kg     Unresulted Labs (From admission, onward)     Start     Ordered   03/26/23 0741  Aerobic/Anaerobic Culture w Gram Stain (surgical/deep wound)  RELEASE UPON ORDERING,   TIMED       Comments: Specimen A: Phone [phone]         Previous Biopsy:  no Is the patient on airborne/droplet precautions? No           Clinical History:  N/A Copy of Report to:  N/A Specimen Disposition: Microbiology     03/26/23 0741   03/26/23 0741  Aerobic/Anaerobic Culture w Gram Stain (surgical/deep wound)  RELEASE UPON ORDERING,   TIMED       Comments: Specimen B: Phone [phone]         Previous Biopsy:  No Is the patient on airborne/droplet precautions? No           Clinical History:  N/A Copy of Report to:  N/A Specimen Disposition: Delivered to Histology     03/26/23 0741   03/26/23 0500  Basic metabolic panel  Daily,   R      03/25/23 0711   03/26/23 0500  CBC  Daily,   R      03/25/23 0711          Data Reviewed: I have personally reviewed following labs and imaging studies CBC: Recent Labs  Lab 03/24/23 1406 03/24/23 1415 03/25/23 0444 03/26/23 0501  WBC 11.8*  --  9.4 6.8  NEUTROABS 9.8*  --   --   --   HGB 15.3* 15.6* 14.3 13.3  HCT 46.1* 46.0 44.6 41.7  MCV 92.9  --  94.7 95.2  PLT 256  --  259 235   Basic Metabolic Panel: Recent Labs  Lab 03/24/23 1406 03/24/23 1415 03/25/23 0444 03/26/23 0501  NA 137 138 135 137  K  3.6 3.7 3.3* 3.2*  CL 101 101 101 104  CO2 24  --  22 23  GLUCOSE 134* 133* 99 82  BUN 13 11 13 20   CREATININE 0.59 0.50 0.56 0.48  CALCIUM 9.1  --  8.5* 8.5*   GFR: Estimated Creatinine Clearance: 94.2 mL/min (by C-G formula based on SCr of 0.48 mg/dL). Liver Function Tests: Recent Labs  Lab 03/24/23 1406  AST 17  ALT  27  ALKPHOS 73  BILITOT 1.0  PROT 8.2*  ALBUMIN 4.5   CBG: Recent Labs  Lab 03/25/23 2047 03/25/23 2336 03/26/23 0342 03/26/23 0635 03/26/23 0807  GLUCAP 216* 153* 83 98 103*   Recent Labs  Lab 03/24/23 1415  LATICACIDVEN 1.2    Recent Results (from the past 240 hour(s))  Culture, blood (Routine X 2) w Reflex to ID Panel     Status: None (Preliminary result)   Collection Time: 03/24/23  2:06 PM   Specimen: BLOOD  Result Value Ref Range Status   Specimen Description   Final    BLOOD BLOOD LEFT HAND Performed at [facility], [address], [place], [place] [address]    Special Requests   Final    BOTTLES DRAWN AEROBIC AND ANAEROBIC Blood Culture results may not be optimal due to an excessive volume of blood received in culture bottles Performed at [facility], [address] [place], [place] [address]    Culture   Final    NO GROWTH 2 DAYS Performed at [facility], [address], [place], [place] [address]    Report Status PENDING  Incomplete  Culture, blood (Routine X 2) w Reflex to ID Panel     Status: None (Preliminary result)   Collection Time: 03/24/23  2:06 PM   Specimen: BLOOD  Result Value Ref Range Status   Specimen Description   Final    BLOOD RIGHT ANTECUBITAL Performed at [facility], [address], [place], [place] [address]    Special Requests   Final    BOTTLES DRAWN AEROBIC AND ANAEROBIC Blood Culture adequate volume Performed at [facility], [address] [place], [place] [address]    Culture   Final    NO GROWTH 2  DAYS Performed at [facility], [address] [place], [place] [address]    Report Status PENDING  Incomplete  Surgical PCR screen     Status: Abnormal   Collection Time: 03/25/23  3:04 PM   Specimen: Nasal Mucosa; Nasal Swab  Result Value Ref Range Status   MRSA, PCR NEGATIVE NEGATIVE Final   Staphylococcus aureus POSITIVE (A) NEGATIVE Final    Comment: (NOTE) The Xpert SA Assay (FDA approved for NASAL specimens in patients 68 years of age and older), is one component of a comprehensive surveillance program. It is not intended to diagnose infection nor to guide or monitor treatment. Performed at [facility], [address], [place], [place] [address]     Antimicrobials: Anti-infectives (From admission, onward)    Start     Dose/Rate Route Frequency Ordered Stop   03/26/23 0702  vancomycin (VANCOCIN) powder  Status:  Discontinued          As needed 03/26/23 0702 03/26/23 0852   03/26/23 0600  ceFAZolin (ANCEF) IVPB 2g/100 mL premix  Status:  Discontinued        2 g 200 mL/hr over 30 Minutes Intravenous On call to O.R. 03/25/23 1711 03/25/23 2000   03/25/23 1100  ceFEPIme (MAXIPIME) 2 g in sodium chloride 0.9 % 100 mL IVPB        2 g 200 mL/hr over 30 Minutes Intravenous Every 8 hours 03/25/23 0945     03/25/23 1030  metroNIDAZOLE (FLAGYL) IVPB 500 mg  Status:  Discontinued        500 mg 100 mL/hr over 60 Minutes Intravenous Every 12 hours 03/25/23 0944 03/25/23 1531  03/25/23 1000  vancomycin (VANCOREADY) IVPB 1750 mg/350 mL        1,750 mg 175 mL/hr over 120 Minutes Intravenous Daily 03/24/23 1709     03/24/23 1800  vancomycin (VANCOREADY) IVPB 500 mg/100 mL        500 mg 100 mL/hr over 60 Minutes Intravenous  Once 03/24/23 1709 03/24/23 2015   03/24/23 1400  vancomycin (VANCOCIN) IVPB 1000 mg/200 mL premix  Status:  Discontinued        1,000 mg 200 mL/hr over 60 Minutes Intravenous  Once 03/24/23 1348 03/24/23 1355   03/24/23 1400  vancomycin  (VANCOREADY) IVPB 1500 mg/300 mL        1,500 mg 150 mL/hr over 120 Minutes Intravenous  Once 03/24/23 1355 03/24/23 1652      Culture/Microbiology    Component Value Date/Time   SDES  03/24/2023 1406    BLOOD BLOOD LEFT HAND Performed at [facility], [address], [place], [place] [address]    SDES  03/24/2023 1406    BLOOD RIGHT ANTECUBITAL Performed at [facility], [address] [place] [address], [place], [place] [address]    SPECREQUEST  03/24/2023 1406    BOTTLES DRAWN AEROBIC AND ANAEROBIC Blood Culture results may not be optimal due to an excessive volume of blood received in culture bottles Performed at [facility], [address], [place], [place] [address]    SPECREQUEST  03/24/2023 1406    BOTTLES DRAWN AEROBIC AND ANAEROBIC Blood Culture adequate volume Performed at [facility], [address] [place], [place] [address]    CULT  03/24/2023 1406    NO GROWTH 2 DAYS Performed at [facility], [address], [place], [place] [address]    CULT  03/24/2023 1406    NO GROWTH 2 DAYS Performed at [facility], [address], [place], [place] [address]    REPTSTATUS PENDING 03/24/2023 1406   REPTSTATUS PENDING 03/24/2023 1406    Radiology Studies: MR FOOT RIGHT W WO CONTRAST  Result Date: 03/25/2023 CLINICAL DATA:  Osteomyelitis, foot EXAM: MRI OF THE RIGHT FOREFOOT WITHOUT AND WITH CONTRAST TECHNIQUE: Multiplanar, multisequence MR imaging of the right forefoot was performed before and after the administration of intravenous contrast. CONTRAST:  10mL GADAVIST GADOBUTROL 1 MMOL/ML IV SOLN COMPARISON:  X-ray 03/25/2023, MRI 06/02/2022 FINDINGS: Bones/Joint/Cartilage Interval fifth ray resection the level of the mid fifth metatarsal diaphysis. No erosion or marrow edema within the residual fifth metatarsal. There is bone marrow edema within the distal phalanx of the fourth toe with intermediate to  low T1 marrow signal in the distal tuft compatible with acute osteomyelitis. Preserved bone marrow signal within the proximal and middle phalanx of the fourth toe. No additional sites of bone marrow edema or marrow replacement. No fracture or dislocation. Similar degenerative changes of foot. Ligaments Intact Lisfranc ligament.  Intact collateral ligaments. Muscles and Tendons Denervation changes of the foot musculature.  No tenosynovitis. Soft tissues Prominent soft tissue swelling of the fourth toe with suspected cutaneous blistering. No organized or rim enhancing fluid collections within the soft tissues. IMPRESSION: 1. Acute osteomyelitis of the distal phalanx of the right fourth toe. 2. Prominent soft tissue swelling of the fourth toe with suspected cutaneous blistering. 3. Interval transmetatarsal amputation of the fifth ray. No evidence of osteomyelitis within the residual fifth metatarsal. Electronically Signed   By: Duanne Guess D.O.   On: 03/25/2023 11:07   DG Toe 4th Right  Result  Date: 03/24/2023 CLINICAL DATA:  Evaluate for osteomyelitis. Patient complains of discoloration/darkening of the fourth toe. EXAM: RIGHT FOURTH TOE COMPARISON:  None Available. FINDINGS: The AP and oblique images are suboptimal as the distal phalanx is in flexion and obscured by overlying osseous structures. No signs of acute fracture or dislocation. No focal bone erosions identified. There is diffuse soft tissue edema. No signs of acute fracture. IMPRESSION: 1. Suboptimal evaluation of the distal phalanx of the fourth toe. No signs of acute fracture, dislocation or bony erosion of osteomyelitis. 2. Diffuse soft tissue edema. Electronically Signed   By: Signa Kell M.D.   On: 03/24/2023 17:21   CT ANGIO AO+BIFEM W & OR WO CONTRAST  Result Date: 03/24/2023 CLINICAL DATA:  Discoloration/darkening of fourth toe. Evaluate for arterial dissection. EXAM: CT ANGIOGRAPHY OF ABDOMINAL AORTA WITH ILIOFEMORAL RUNOFF TECHNIQUE:  Multidetector CT imaging of the abdomen, pelvis and lower extremities was performed using the standard protocol during bolus administration of intravenous contrast. Multiplanar CT image reconstructions and MIPs were obtained to evaluate the vascular anatomy. RADIATION DOSE REDUCTION: This exam was performed according to the departmental dose-optimization program which includes automated exposure control, adjustment of the mA and/or kV according to patient size and/or use of iterative reconstruction technique. CONTRAST:  OMNIPAQUE IOHEXOL 350 MG/ML SOLN COMPARISON:  None Available. FINDINGS: VASCULAR Aorta: Normal caliber aorta without aneurysm, dissection, vasculitis or significant stenosis. Mild aortic atherosclerotic calcifications. Celiac: Patent without evidence of aneurysm, dissection, vasculitis or significant stenosis. SMA: Patent without evidence of aneurysm, dissection, vasculitis or significant stenosis. Renals: Both renal arteries are patent without evidence of aneurysm, dissection, vasculitis, fibromuscular dysplasia or significant stenosis. IMA: Patent without evidence of aneurysm, dissection, vasculitis or significant stenosis. RIGHT Lower Extremity Inflow: Common, internal and external iliac arteries are patent without evidence of aneurysm, dissection, vasculitis or significant stenosis. Outflow: Common, superficial and profunda femoral arteries and the popliteal artery are patent without evidence of aneurysm, dissection, vasculitis or significant stenosis. Runoff: Patent three vessel runoff to the ankle. LEFT Lower Extremity Inflow: Common, internal and external iliac arteries are patent without evidence of aneurysm, dissection, vasculitis or significant stenosis. Outflow: Common, superficial and profunda femoral arteries and the popliteal artery are patent without evidence of aneurysm, dissection, vasculitis or significant stenosis. Runoff: Patent three vessel runoff to the ankle. Veins: No  obvious venous abnormality within the limitations of this arterial phase study. Review of the MIP images confirms the above findings. NON-VASCULAR Lower chest: No acute abnormality. Hepatobiliary: No focal liver abnormality is seen. Status post cholecystectomy. No biliary dilatation. Pancreas: Unremarkable. No pancreatic ductal dilatation or surrounding inflammatory changes. Spleen: Normal in size without focal abnormality. Adrenals/Urinary Tract: Adrenal glands are unremarkable. Kidneys are normal, without renal calculi, focal lesion, or hydronephrosis. Bladder is unremarkable. Stomach/Bowel: Stomach is within normal limits. Appendix appears normal. No evidence of bowel wall thickening, distention, or inflammatory changes. Lymphatic: No enlarged abdominopelvic lymph nodes. Reproductive: Uterus and bilateral adnexa are unremarkable. Other: No free fluid or fluid collections. Musculoskeletal: No acute or significant osseous findings. IMPRESSION: 1. No evidence of arterial dissection or significant stenosis. 2. Non-vascular. No acute abdominopelvic abnormality. 3. Aortic Atherosclerosis (ICD10-I70.0). Electronically Signed   By: Signa Kell M.D.   On: 03/24/2023 15:55     LOS: 2 days   Dorcas Carrow, MD [facility]  03/26/2023, 11:00 AM

## 2023-03-27 ENCOUNTER — Encounter (HOSPITAL_COMMUNITY): Payer: Self-pay | Admitting: Orthopaedic Surgery

## 2023-03-27 DIAGNOSIS — L03115 Cellulitis of right lower limb: Secondary | ICD-10-CM | POA: Diagnosis not present

## 2023-03-27 DIAGNOSIS — I10 Essential (primary) hypertension: Secondary | ICD-10-CM

## 2023-03-27 DIAGNOSIS — E118 Type 2 diabetes mellitus with unspecified complications: Secondary | ICD-10-CM

## 2023-03-27 DIAGNOSIS — E876 Hypokalemia: Secondary | ICD-10-CM | POA: Insufficient documentation

## 2023-03-27 DIAGNOSIS — M869 Osteomyelitis, unspecified: Secondary | ICD-10-CM | POA: Diagnosis not present

## 2023-03-27 LAB — GLUCOSE, CAPILLARY
Glucose-Capillary: 117 mg/dL — ABNORMAL HIGH (ref 70–99)
Glucose-Capillary: 140 mg/dL — ABNORMAL HIGH (ref 70–99)
Glucose-Capillary: 141 mg/dL — ABNORMAL HIGH (ref 70–99)
Glucose-Capillary: 149 mg/dL — ABNORMAL HIGH (ref 70–99)
Glucose-Capillary: 180 mg/dL — ABNORMAL HIGH (ref 70–99)
Glucose-Capillary: 210 mg/dL — ABNORMAL HIGH (ref 70–99)

## 2023-03-27 MED ORDER — INSULIN ASPART 100 UNIT/ML IJ SOLN
4.0000 [IU] | Freq: Three times a day (TID) | INTRAMUSCULAR | Status: DC
  Administered 2023-03-27 – 2023-03-28 (×4): 4 [IU] via SUBCUTANEOUS

## 2023-03-27 MED ORDER — LISINOPRIL 20 MG PO TABS
20.0000 mg | ORAL_TABLET | Freq: Every day | ORAL | Status: DC
  Administered 2023-03-27 – 2023-03-29 (×3): 20 mg via ORAL
  Filled 2023-03-27 (×3): qty 1

## 2023-03-27 MED ORDER — INSULIN ASPART 100 UNIT/ML IJ SOLN
0.0000 [IU] | Freq: Every day | INTRAMUSCULAR | Status: DC
  Administered 2023-03-27: 2 [IU] via SUBCUTANEOUS

## 2023-03-27 MED ORDER — EMPAGLIFLOZIN 25 MG PO TABS
25.0000 mg | ORAL_TABLET | Freq: Every day | ORAL | Status: DC
  Administered 2023-03-27 – 2023-03-29 (×3): 25 mg via ORAL
  Filled 2023-03-27 (×3): qty 1

## 2023-03-27 MED ORDER — INSULIN ASPART 100 UNIT/ML IJ SOLN
0.0000 [IU] | Freq: Three times a day (TID) | INTRAMUSCULAR | Status: DC
  Administered 2023-03-27: 3 [IU] via SUBCUTANEOUS
  Administered 2023-03-27 – 2023-03-28 (×3): 2 [IU] via SUBCUTANEOUS
  Administered 2023-03-28: 5 [IU] via SUBCUTANEOUS

## 2023-03-27 NOTE — Progress Notes (Signed)
Subjective: 1 Day Post-Op Procedure(s) (LRB): RIGHT 4TH TOE AMPUTATION (Right)  Patient reports pain as appropriately controlled. Denies any new numbness/tingling. Stable on RNF.  Objective:   VITALS:  Temp:  [98 F (36.7 C)-98.1 F (36.7 C)] 98 F (36.7 C) (08/07 1013) Pulse Rate:  [69-83] 70 (08/07 1013) Resp:  [16-20] 16 (08/07 1013) BP: (118-141)/(74-83) 118/74 (08/07 1013) SpO2:  [95 %-98 %] 98 % (08/07 1013)  Gen: AAOx3, NAD Comfortable at rest  Right Lower Extremity: Incision c/d/I No erythema/drainage ADF/APF/EHL 5/5 SILT throughout DP, PT 2+ to palp CR < 2s    LABS Recent Labs    03/25/23 0444 03/26/23 0501 03/27/23 0449  HGB 14.3 13.3 13.0  WBC 9.4 6.8 5.8  PLT 259 235 246   Recent Labs    03/26/23 0501 03/27/23 0449  NA 137 134*  K 3.2* 3.6  CL 104 103  CO2 23 22  BUN 20 17  CREATININE 0.48 0.54  GLUCOSE 82 151*   No results for input(s): "LABPT", "INR" in the last 72 hours.   Assessment/Plan: 1 Day Post-Op Procedure(s) (LRB): RIGHT 4TH TOE AMPUTATION (Right)  -stable RNF under Medicine -strict heel weightbearing operative extremity in postop shoe, maximum elevation -dressing changes daily by bedside nursing -DVT ppx: Aspirin 81 mg twice daily or per primary team -IV abx per primary team/ID -optimize glycemic control -trend intra-op cx -follow up as outpatient within 7-10 days for wound check -sutures out in 2-3 weeks in outpatient office  Netta Cedars 03/27/2023, 6:17 PM

## 2023-03-27 NOTE — Plan of Care (Signed)

## 2023-03-27 NOTE — Plan of Care (Signed)
  Problem: Activity: Goal: Risk for activity intolerance will decrease Outcome: Progressing   Problem: Nutrition: Goal: Adequate nutrition will be maintained Outcome: Progressing   Problem: Elimination: Goal: Will not experience complications related to bowel motility Outcome: Progressing Goal: Will not experience complications related to urinary retention Outcome: Progressing   

## 2023-03-27 NOTE — Progress Notes (Signed)
[facility]  Date of Admission:  03/24/2023   Total days of inpatient antibiotics 2  Principal Problem:   Osteomyelitis of toe of right foot (HCC) Active Problems:   Diabetes mellitus type 2 with complications (HCC)   Hyperlipidemia associated with type 2 diabetes mellitus (HCC)   Depression   Essential hypertension   Diabetic peripheral neuropathy (HCC)   Cellulitis of right foot   Hypokalemia          Assessment: 71 YF admitted with:     #Right 4th metatarsal osteomyelitis status post fourth toe amputation through proximal phalanx on 8/6 #Hx of right 5th tow soteo EP amputation Cx+ MSSA -Back in 10/23 patient had right lateral foot gangrene with fifth toe osteomyelitis, underwent right fifth ray amputation on 1014/23 with Dr. Odis Hollingshead.  Followed by infectious disease, received 2 weeks of p.o. antibiotics for, pathology noted good margins.  Cultures at that time grew MSSA, treated with linezolid. -Patient reports wound healed well following amputation back in October 2023. - She states she went to [facility] and was in the ocean and pool, returned last week.  Denies using hot tub.  She states that she felt well but fatigued after return.  She noted that on Friday she had some right groin pain and noticed erythema around the right foot, fourth toe erythema now has progressed.  Tip of toe appears gangrenous.  Ortho engaged, patient underwent right fourth toe amputation proximal phalanx.  Pathology and culture sent.   Recommendations:  -Follow OR Cx and path. Cx re-incubating.  Await on further culture data to decide whether patient will require IV versus p.o. antibiotics outpatient. -Continue vancomycin and cefepime - Follow blood cultures   #Diabetes mellitus-poorly controlled - A1c 8.6 ion 8/5 Microbiology:   Antibiotics: 8/4-Vancomycin, 8/5-cefepime 8/5 metronidazole Cultures: Blood 8/4 NG Other 8/6OR cx  re-incubated  SUBJECTIVE: Resting in bed. Inquired about abx plan.  Interval:  Afebrile overnight Review of Systems: Review of Systems  All other systems reviewed and are negative.    Scheduled Meds:  atorvastatin  80 mg Oral Daily   empagliflozin  25 mg Oral Daily   enoxaparin (LOVENOX) injection  40 mg Subcutaneous Q24H   gabapentin  300 mg Oral QID   insulin aspart  0-15 Units Subcutaneous TID WC   insulin aspart  0-5 Units Subcutaneous QHS   insulin aspart  4 Units Subcutaneous TID WC   insulin glargine-yfgn  40 Units Subcutaneous QHS   lisinopril  20 mg Oral Daily   mupirocin ointment  1 Application Nasal BID   sertraline  100 mg Oral Daily   Continuous Infusions:  0.9 % NaCl with KCl 20 mEq / L 10 mL/hr at 03/27/23 0954   ceFEPime (MAXIPIME) IV 2 g (03/27/23 1238)   vancomycin 1,750 mg (03/27/23 0958)   PRN Meds:.acetaminophen **OR** acetaminophen, albuterol, ondansetron **OR** ondansetron (ZOFRAN) IV, oxyCODONE, traZODone No Known Allergies  OBJECTIVE: Vitals:   03/26/23 2107 03/27/23 0030 03/27/23 0432 03/27/23 1013  BP: 128/83 (!) 141/82 136/77 118/74  Pulse: 83 77 69 70  Resp: 16 20 20 16   Temp: 98.1 F (36.7 C) 98.1 F (36.7 C) 98 F (36.7 C) 98 F (36.7 C)  TempSrc: Oral Oral Oral Oral  SpO2: 95% 98% 97% 98%  Weight:      Height:       Body mass index is 36.79 kg/m.  Physical Exam Constitutional:      Appearance:  Normal appearance.  HENT:     Head: Normocephalic and atraumatic.     Right Ear: Tympanic membrane normal.     Left Ear: Tympanic membrane normal.     Nose: Nose normal.     Mouth/Throat:     Mouth: Mucous membranes are moist.  Eyes:     Extraocular Movements: Extraocular movements intact.     Conjunctiva/sclera: Conjunctivae normal.     Pupils: Pupils are equal, round, and reactive to light.  Cardiovascular:     Rate and Rhythm: Normal rate and regular rhythm.     Heart sounds: No murmur heard.    No friction rub. No gallop.   Pulmonary:     Effort: Pulmonary effort is normal.     Breath sounds: Normal breath sounds.  Abdominal:     General: Abdomen is flat.     Palpations: Abdomen is soft.  Musculoskeletal:     Comments: R foot with post surgical bandage in place.   Skin:    General: Skin is warm and dry.  Neurological:     General: No focal deficit present.     Mental Status: She is alert and oriented to person, place, and time.  Psychiatric:        Mood and Affect: Mood normal.       Lab Results Lab Results  Component Value Date   WBC 5.8 03/27/2023   HGB 13.0 03/27/2023   HCT 39.9 03/27/2023   MCV 94.8 03/27/2023   PLT 246 03/27/2023    Lab Results  Component Value Date   CREATININE 0.54 03/27/2023   BUN 17 03/27/2023   NA 134 (L) 03/27/2023   K 3.6 03/27/2023   CL 103 03/27/2023   CO2 22 03/27/2023    Lab Results  Component Value Date   ALT 27 03/24/2023   AST 17 03/24/2023   ALKPHOS 73 03/24/2023   BILITOT 1.0 03/24/2023        Danelle Earthly, MD [facility] 03/27/2023, 2:27 PM I have personally spent 55 minutes involved in face-to-face and non-face-to-face activities for this patient on the day of the visit. Professional time spent includes the following activities: Preparing to see the patient (review of tests), Obtaining and/or reviewing separately obtained history (admission/discharge record), Performing a medically appropriate examination and/or evaluation , Ordering medications/tests/procedures, referring and communicating with other health care professionals, Documenting clinical information in the EMR, Independently interpreting results (not separately reported), Communicating results to the patient/family/caregiver, Counseling and educating the patient/family/caregiver and Care coordination (not separately reported).

## 2023-03-27 NOTE — Progress Notes (Signed)
[facility] PROGRESS NOTE    Progress Note  Donna [name]  OZH:[id number] DOB: November 10, 1965 DOA: 03/24/2023 PCP: Myrlene Broker, MD     Brief Narrative:   Donna [name] is an 57 y.o. female past medical history of poorly controlled diabetes mellitus type 2, essential hypertension prior right fifth toe osteomyelitis and amputation almost a year ago admitted for right fourth toe cellulitis and concern for osteomyelitis MRI of the foot on 03/25/2023 showed acute osteomyelitis of the distal phalange E on the right fourth toe prominent soft tissue swelling   Assessment/Plan:   Osteomyelitis of toe of right foot (HCC)/ Cellulitis of right foot MRI positive for acute osteomyelitis. Orthopedic surgery was consulted status post amputation right fourth toe. Blood cultures on 03/24/2023 have been negative till date. Currently on IV vancomycin and cefazolin. ID on board.  Poorly controlled diabetes mellitus type 2 with complications (HCC) Last A1c 8.6, currently on long-acting insulin plus sliding scale tolerating 100% of her diet.  Essential hypertension: Continue to hold his and hydrochlorothiazide blood pressure is well improved.  Hypokalemia: Repleted now improved.  Depression: Continue Zoloft.  Hyperlipidemia: Continue statins.  Peripheral neuropathy, diabetic: Continue Neurontin.  DVT prophylaxis: lovenox Family Communication:none Status is: Inpatient Remains inpatient appropriate because: Right toe osteomyelitis.    Code Status:     Code Status Orders  (From admission, onward)           Start     Ordered   03/24/23 1627  Full code  Continuous       Question:  By:  Answer:  Consent: discussion documented in EHR   03/24/23 1627           Code Status History     Date Active Date Inactive Code Status Order ID Comments User Context   06/02/2022 1612 06/04/2022 1904 Full Code [id number]  Teddy Spike, DO Inpatient         IV Access:    Peripheral IV   Procedures and diagnostic studies:   MR FOOT RIGHT W WO CONTRAST  Result Date: 03/25/2023 CLINICAL DATA:  Osteomyelitis, foot EXAM: MRI OF THE RIGHT FOREFOOT WITHOUT AND WITH CONTRAST TECHNIQUE: Multiplanar, multisequence MR imaging of the right forefoot was performed before and after the administration of intravenous contrast. CONTRAST:  10mL GADAVIST GADOBUTROL 1 MMOL/ML IV SOLN COMPARISON:  X-ray 03/25/2023, MRI 06/02/2022 FINDINGS: Bones/Joint/Cartilage Interval fifth ray resection the level of the mid fifth metatarsal diaphysis. No erosion or marrow edema within the residual fifth metatarsal. There is bone marrow edema within the distal phalanx of the fourth toe with intermediate to low T1 marrow signal in the distal tuft compatible with acute osteomyelitis. Preserved bone marrow signal within the proximal and middle phalanx of the fourth toe. No additional sites of bone marrow edema or marrow replacement. No fracture or dislocation. Similar degenerative changes of foot. Ligaments Intact Lisfranc ligament.  Intact collateral ligaments. Muscles and Tendons Denervation changes of the foot musculature.  No tenosynovitis. Soft tissues Prominent soft tissue swelling of the fourth toe with suspected cutaneous blistering. No organized or rim enhancing fluid collections within the soft tissues. IMPRESSION: 1. Acute osteomyelitis of the distal phalanx of the right fourth toe. 2. Prominent soft tissue swelling of the fourth toe with suspected cutaneous blistering. 3. Interval transmetatarsal amputation of the fifth ray. No evidence of osteomyelitis within the residual fifth metatarsal. Electronically Signed   By: Duanne Guess D.O.   On: 03/25/2023 11:07     Medical Consultants:  None.   Subjective:    Donna [name] relates pain is controlled.  Objective:    Vitals:   03/26/23 1756 03/26/23 2107 03/27/23 0030 03/27/23 0432  BP: 116/65 128/83 (!) 141/82 136/77  Pulse: 82  83 77 69  Resp: 16 16 20 20   Temp: 98.6 F (37 C) 98.1 F (36.7 C) 98.1 F (36.7 C) 98 F (36.7 C)  TempSrc: Oral Oral Oral Oral  SpO2: 98% 95% 98% 97%  Weight:      Height:       SpO2: 97 %   Intake/Output Summary (Last 24 hours) at 03/27/2023 0803 Last data filed at 03/27/2023 0000 Gross per 24 hour  Intake 2127.98 ml  Output --  Net 2127.98 ml   Filed Weights   03/24/23 1334 03/24/23 1703  Weight: 102.1 kg 103.4 kg    Exam: General exam: In no acute distress. Respiratory system: Good air movement and clear to auscultation. Cardiovascular system: S1 & S2 heard, RRR. No JVD. Gastrointestinal system: Abdomen is nondistended, soft and nontender.  Extremities: No pedal edema. Skin: No rashes, lesions or ulcers Psychiatry: Judgement and insight appear normal. Mood & affect appropriate.    Data Reviewed:    Labs: Basic Metabolic Panel: Recent Labs  Lab 03/24/23 1406 03/24/23 1415 03/25/23 0444 03/26/23 0501 03/27/23 0449  NA 137 138 135 137 134*  K 3.6 3.7 3.3* 3.2* 3.6  CL 101 101 101 104 103  CO2 24  --  22 23 22   GLUCOSE 134* 133* 99 82 151*  BUN 13 11 13 20 17   CREATININE 0.59 0.50 0.56 0.48 0.54  CALCIUM 9.1  --  8.5* 8.5* 8.0*   GFR Estimated Creatinine Clearance: 94.2 mL/min (by C-G formula based on SCr of 0.54 mg/dL). Liver Function Tests: Recent Labs  Lab 03/24/23 1406  AST 17  ALT 27  ALKPHOS 73  BILITOT 1.0  PROT 8.2*  ALBUMIN 4.5   No results for input(s): "LIPASE", "AMYLASE" in the last 168 hours. No results for input(s): "AMMONIA" in the last 168 hours. Coagulation profile No results for input(s): "INR", "PROTIME" in the last 168 hours. COVID-19 Labs  Recent Labs    03/25/23 1043  CRP 6.8*    Lab Results  Component Value Date   SARSCOV2NAA NOT DETECTED 07/20/2019    CBC: Recent Labs  Lab 03/24/23 1406 03/24/23 1415 03/25/23 0444 03/26/23 0501 03/27/23 0449  WBC 11.8*  --  9.4 6.8 5.8  NEUTROABS 9.8*  --   --   --    --   HGB 15.3* 15.6* 14.3 13.3 13.0  HCT 46.1* 46.0 44.6 41.7 39.9  MCV 92.9  --  94.7 95.2 94.8  PLT 256  --  259 235 246   Cardiac Enzymes: No results for input(s): "CKTOTAL", "CKMB", "CKMBINDEX", "TROPONINI" in the last 168 hours. BNP (last 3 results) No results for input(s): "PROBNP" in the last 8760 hours. CBG: Recent Labs  Lab 03/26/23 1625 03/26/23 2008 03/27/23 0028 03/27/23 0429 03/27/23 0752  GLUCAP 110* 199* 141* 140* 117*   D-Dimer: No results for input(s): "DDIMER" in the last 72 hours. Hgb A1c: Recent Labs    03/25/23 0444  HGBA1C 8.6*   Lipid Profile: No results for input(s): "CHOL", "HDL", "LDLCALC", "TRIG", "CHOLHDL", "LDLDIRECT" in the last 72 hours. Thyroid function studies: No results for input(s): "TSH", "T4TOTAL", "T3FREE", "THYROIDAB" in the last 72 hours.  Invalid input(s): "FREET3" Anemia work up: No results for input(s): "VITAMINB12", "FOLATE", "FERRITIN", "TIBC", "IRON", "RETICCTPCT"  in the last 72 hours. Sepsis Labs: Recent Labs  Lab 03/24/23 1406 03/24/23 1415 03/25/23 0444 03/26/23 0501 03/27/23 0449  WBC 11.8*  --  9.4 6.8 5.8  LATICACIDVEN  --  1.2  --   --   --    Microbiology Recent Results (from the past 240 hour(s))  Culture, blood (Routine X 2) w Reflex to ID Panel     Status: None (Preliminary result)   Collection Time: 03/24/23  2:06 PM   Specimen: BLOOD  Result Value Ref Range Status   Specimen Description   Final    BLOOD BLOOD LEFT HAND Performed at [facility], [address], [place], [place] [address]    Special Requests   Final    BOTTLES DRAWN AEROBIC AND ANAEROBIC Blood Culture results may not be optimal due to an excessive volume of blood received in culture bottles Performed at [facility], [address], [place], [place] [address]    Culture   Final    NO GROWTH 3 DAYS Performed at [facility], [address], [place], [place] [address]    Report Status  PENDING  Incomplete  Culture, blood (Routine X 2) w Reflex to ID Panel     Status: None (Preliminary result)   Collection Time: 03/24/23  2:06 PM   Specimen: BLOOD  Result Value Ref Range Status   Specimen Description   Final    BLOOD RIGHT ANTECUBITAL Performed at [facility], [address] [place], [place] [address]    Special Requests   Final    BOTTLES DRAWN AEROBIC AND ANAEROBIC Blood Culture adequate volume Performed at [facility], [address], [place], [place] [address]    Culture   Final    NO GROWTH 3 DAYS Performed at [facility], [address] [place], [place] [address]    Report Status PENDING  Incomplete  Surgical PCR screen     Status: Abnormal   Collection Time: 03/25/23  3:04 PM   Specimen: Nasal Mucosa; Nasal Swab  Result Value Ref Range Status   MRSA, PCR NEGATIVE NEGATIVE Final   Staphylococcus aureus POSITIVE (A) NEGATIVE Final    Comment: (NOTE) The Xpert SA Assay (FDA approved for NASAL specimens in patients 92 years of age and older), is one component of a comprehensive surveillance program. It is not intended to diagnose infection nor to guide or monitor treatment. Performed at [facility], [address], [place], [place] [address]   Aerobic/Anaerobic Culture w Gram Stain (surgical/deep wound)     Status: None (Preliminary result)   Collection Time: 03/26/23  7:39 AM   Specimen: PATH Cytology Misc. fluid; Body Fluid  Result Value Ref Range Status   Specimen Description   Final    TOE RT4TH Performed at [facility], [address], [place], [place] [address]    Special Requests   Final    NONE Performed at [facility], [address] [place], [place] [address]    Gram Stain   Final    RARE WBC PRESENT,BOTH PMN AND MONONUCLEAR NO ORGANISMS SEEN Performed at [facility], [address], [place], [place] [address]    Culture  PENDING  Incomplete   Report Status PENDING  Incomplete  Aerobic/Anaerobic Culture w Gram Stain (surgical/deep wound)     Status: None (Preliminary result)   Collection Time: 03/26/23  7:40 AM   Specimen:  PATH Digit amputation; Tissue  Result Value Ref Range Status   Specimen Description   Final    BONE RT4TH TOE Performed at [facility], [address] [place], [place] [address]    Special Requests   Final    NONE Performed at [facility], [address], [place], [place] [address]    Gram Stain   Final    NO WBC SEEN NO ORGANISMS SEEN Performed at [facility], [address] [place], [place] [address]    Culture PENDING  Incomplete   Report Status PENDING  Incomplete     Medications:    atorvastatin  80 mg Oral Daily   enoxaparin (LOVENOX) injection  40 mg Subcutaneous Q24H   gabapentin  300 mg Oral QID   insulin aspart  0-15 Units Subcutaneous TID WC   insulin aspart  0-5 Units Subcutaneous QHS   insulin glargine-yfgn  40 Units Subcutaneous QHS   mupirocin ointment  1 Application Nasal BID   sertraline  100 mg Oral Daily   Continuous Infusions:  0.9 % NaCl with KCl 20 mEq / L 50 mL/hr at 03/26/23 2228   ceFEPime (MAXIPIME) IV 2 g (03/27/23 0308)   vancomycin 1,750 mg (03/26/23 0902)      LOS: 3 days   Marinda Elk  [facility]  03/27/2023, 8:03 AM

## 2023-03-28 ENCOUNTER — Other Ambulatory Visit (HOSPITAL_COMMUNITY): Payer: Self-pay

## 2023-03-28 DIAGNOSIS — E118 Type 2 diabetes mellitus with unspecified complications: Secondary | ICD-10-CM | POA: Diagnosis not present

## 2023-03-28 DIAGNOSIS — M869 Osteomyelitis, unspecified: Secondary | ICD-10-CM | POA: Diagnosis not present

## 2023-03-28 DIAGNOSIS — L03115 Cellulitis of right lower limb: Secondary | ICD-10-CM | POA: Diagnosis not present

## 2023-03-28 LAB — GLUCOSE, CAPILLARY
Glucose-Capillary: 136 mg/dL — ABNORMAL HIGH (ref 70–99)
Glucose-Capillary: 213 mg/dL — ABNORMAL HIGH (ref 70–99)
Glucose-Capillary: 136 mg/dL — ABNORMAL HIGH (ref 70–99)
Glucose-Capillary: 143 mg/dL — ABNORMAL HIGH (ref 70–99)

## 2023-03-28 MED ORDER — INSULIN ASPART 100 UNIT/ML IJ SOLN
6.0000 [IU] | Freq: Three times a day (TID) | INTRAMUSCULAR | Status: DC
  Administered 2023-03-28 – 2023-03-29 (×3): 6 [IU] via SUBCUTANEOUS

## 2023-03-28 MED ORDER — LINEZOLID 600 MG PO TABS
600.0000 mg | ORAL_TABLET | Freq: Two times a day (BID) | ORAL | Status: DC
  Administered 2023-03-29: 600 mg via ORAL
  Filled 2023-03-28: qty 1

## 2023-03-28 MED ORDER — CEFADROXIL 500 MG PO CAPS
1000.0000 mg | ORAL_CAPSULE | Freq: Two times a day (BID) | ORAL | 0 refills | Status: AC
  Filled 2023-03-28: qty 112, 28d supply, fill #0

## 2023-03-28 MED ORDER — LINEZOLID 600 MG PO TABS
600.0000 mg | ORAL_TABLET | Freq: Two times a day (BID) | ORAL | 0 refills | Status: AC
  Filled 2023-03-28: qty 24, 12d supply, fill #0

## 2023-03-28 MED ORDER — CEFADROXIL 500 MG PO CAPS: 1000.0000 mg | ORAL_CAPSULE | Freq: Two times a day (BID) | ORAL | Status: DC

## 2023-03-28 NOTE — TOC Progression Note (Signed)
Transition of Care [facility]) - Progression Note    Patient Details  Name: Donna [name] MRN: [id number] Date of Birth: Aug 30, 1965  Transition of Care [facility]) CM/SW Contact  Mico Spark, Olegario Messier, RN Phone Number: 03/28/2023, 12:26 PM  Clinical Narrative:  Continue to follow for d/c plans.     Expected Discharge Plan: Home/Self Care (Home) Barriers to Discharge: Continued Medical Work up  Expected Discharge Plan and Services   Discharge Planning Services: CM Consult   Living arrangements for the past 2 months: Single Family Home                                       Social Determinants of Health (SDOH) Interventions SDOH Screenings   Food Insecurity: No Food Insecurity (03/24/2023)  Housing: Low Risk  (03/24/2023)  Transportation Needs: No Transportation Needs (03/24/2023)  Utilities: Not At Risk (03/24/2023)  Depression (PHQ2-9): Low Risk  (06/12/2022)  Tobacco Use: Low Risk  (03/26/2023)    Readmission Risk Interventions     No data to display

## 2023-03-28 NOTE — Progress Notes (Signed)
[facility]  Date of Admission:  03/24/2023   Total days of inpatient antibiotics 3  Principal Problem:   Osteomyelitis of toe of right foot (HCC) Active Problems:   Diabetes mellitus type 2 with complications (HCC)   Hyperlipidemia associated with type 2 diabetes mellitus (HCC)   Depression   Essential hypertension   Diabetic peripheral neuropathy (HCC)   Cellulitis of right foot   Hypokalemia          Assessment: 28 YF admitted with:     #Right 4th metatarsal osteomyelitis status post fourth toe amputation through proximal phalanx on 8/6 #Hx of right 5th tow soteo EP amputation Cx+ MSSA -Back in 10/23 patient had right lateral foot gangrene with fifth toe osteomyelitis, underwent right fifth ray amputation on 1014/23 with Dr. Odis Hollingshead.  Followed by infectious disease, received 2 weeks of p.o. antibiotics for, pathology noted good margins.  Cultures at that time grew MSSA, treated with linezolid. -Patient reports wound healed well following amputation back in October 2023. - She states she went to [facility] and was in the ocean and pool, returned last week.  Denies using hot tub.  She states that she felt well but fatigued after return.  She noted that on Friday she had some right groin pain and noticed erythema around the right foot, fourth toe erythema now has progressed.  Tip of toe appears gangrenous.  Ortho engaged, patient underwent right fourth toe amputation proximal phalanx.  Pathology and culture sent.   -Cx+ MSSA, Path showed benign bone with acute on chronic inflammation. Given this is her second episode of steo with MSSA(different toe) would treat x 6 weeks.   Recommendations:  -D/C vancomycin and cefepime. -Start linezolid x 2 weeks(EOT 8/19)->cefadroxil 1gm bid x 4 weeks to complete 6 weeks of antibiotics EOT  9/16 - Follow blood cultures. Follow OR Cx for another day  -F/U with ID on 8/29 #Diabetes mellitus-poorly  controlled - A1c 8.6 ion 8/5    Microbiology:   Antibiotics: 8/4-Vancomycin, 8/5-cefepime 8/5 metronidazole Cultures: Blood 8/4 NG Other 8/6OR cx re-incubated  SUBJECTIVE: Resting in bed.discussed antibiotic plan Interval:  Afebrile overnight Review of Systems: Review of Systems  All other systems reviewed and are negative.    Scheduled Meds:  atorvastatin  80 mg Oral Daily   empagliflozin  25 mg Oral Daily   enoxaparin (LOVENOX) injection  40 mg Subcutaneous Q24H   gabapentin  300 mg Oral QID   insulin aspart  0-15 Units Subcutaneous TID WC   insulin aspart  0-5 Units Subcutaneous QHS   insulin aspart  6 Units Subcutaneous TID WC   insulin glargine-yfgn  40 Units Subcutaneous QHS   lisinopril  20 mg Oral Daily   mupirocin ointment  1 Application Nasal BID   sertraline  100 mg Oral Daily   Continuous Infusions:  0.9 % NaCl with KCl 20 mEq / L Stopped (03/27/23 1310)   vancomycin 1,750 mg (03/28/23 0917)   PRN Meds:.acetaminophen **OR** acetaminophen, albuterol, ondansetron **OR** ondansetron (ZOFRAN) IV, oxyCODONE, traZODone No Known Allergies  OBJECTIVE: Vitals:   03/27/23 1013 03/27/23 2129 03/28/23 0359 03/28/23 1141  BP: 118/74 126/62 122/74 110/61  Pulse: 70 65 60 73  Resp: 16 20 18 18   Temp: 98 F (36.7 C) 97.7 F (36.5 C) 97.7 F (36.5 C) 98.7 F (37.1 C)  TempSrc: Oral Oral Oral Oral  SpO2: 98% 98% 95% 98%  Weight:  Height:       Body mass index is 36.79 kg/m.  Physical Exam Constitutional:      Appearance: Normal appearance.  HENT:     Head: Normocephalic and atraumatic.     Right Ear: Tympanic membrane normal.     Left Ear: Tympanic membrane normal.     Nose: Nose normal.     Mouth/Throat:     Mouth: Mucous membranes are moist.  Eyes:     Extraocular Movements: Extraocular movements intact.     Conjunctiva/sclera: Conjunctivae normal.     Pupils: Pupils are equal, round, and reactive to light.  Cardiovascular:     Rate and  Rhythm: Normal rate and regular rhythm.     Heart sounds: No murmur heard.    No friction rub. No gallop.  Pulmonary:     Effort: Pulmonary effort is normal.     Breath sounds: Normal breath sounds.  Abdominal:     General: Abdomen is flat.     Palpations: Abdomen is soft.  Musculoskeletal:     Comments: R foot bandaged  Skin:    General: Skin is warm and dry.  Neurological:     General: No focal deficit present.     Mental Status: She is alert and oriented to person, place, and time.  Psychiatric:        Mood and Affect: Mood normal.       Lab Results Lab Results  Component Value Date   WBC 5.1 03/28/2023   HGB 13.6 03/28/2023   HCT 43.0 03/28/2023   MCV 95.1 03/28/2023   PLT 272 03/28/2023    Lab Results  Component Value Date   CREATININE 0.52 03/28/2023   BUN 16 03/28/2023   NA 138 03/28/2023   K 3.6 03/28/2023   CL 107 03/28/2023   CO2 21 (L) 03/28/2023    Lab Results  Component Value Date   ALT 27 03/24/2023   AST 17 03/24/2023   ALKPHOS 73 03/24/2023   BILITOT 1.0 03/24/2023        Danelle Earthly, MD [facility] 03/28/2023, 11:54 AM I have personally spent 54 minutes involved in face-to-face and non-face-to-face activities for this patient on the day of the visit. Professional time spent includes the following activities: Preparing to see the patient (review of tests), Obtaining and/or reviewing separately obtained history (admission/discharge record), Performing a medically appropriate examination and/or evaluation , Ordering medications/tests/procedures, referring and communicating with other health care professionals, Documenting clinical information in the EMR, Independently interpreting results (not separately reported), Communicating results to the patient/family/caregiver, Counseling and educating the patient/family/caregiver and Care coordination (not separately reported).

## 2023-03-28 NOTE — Plan of Care (Signed)
  Problem: Education: Goal: Knowledge of General Education information will improve Description: Including pain rating scale, medication(s)/side effects and non-pharmacologic comfort measures Outcome: Progressing   Problem: Clinical Measurements: Goal: Ability to maintain clinical measurements within normal limits will improve Outcome: Progressing Goal: Cardiovascular complication will be avoided Outcome: Progressing   

## 2023-03-28 NOTE — Progress Notes (Signed)
[facility] PROGRESS NOTE    Progress Note  Donna [name]  BMW:[id number] DOB: Jan 15, 1966 DOA: 03/24/2023 PCP: Myrlene Broker, MD     Brief Narrative:   Donna [name] is an 57 y.o. female past medical history of poorly controlled diabetes mellitus type 2, essential hypertension prior right fifth toe osteomyelitis and amputation almost a year ago admitted for right fourth toe cellulitis and concern for osteomyelitis MRI of the foot on 03/25/2023 showed acute osteomyelitis of the distal phalange E on the right fourth toe prominent soft tissue swelling   Assessment/Plan:   Osteomyelitis of toe of right foot (HCC)/ Cellulitis of right foot MRI positive for acute osteomyelitis. Orthopedic surgery was consulted status post amputation right fourth toe. Blood cultures on 03/24/2023 have been negative till date. Currently on IV vancomycin and cefazolin. ID to dictate antibiotic coverage.  Poorly controlled diabetes mellitus type 2 with complications (HCC) Last A1c 8.6, currently on long-acting insulin plus sliding scale tolerating 100% of her diet. Blood glucose well-controlled continue long-acting insulin plus sliding scale.  Essential hypertension: Continue to hold her hydrochlorothiazide blood pressure is well improved. Continue lisinopril.  Hypokalemia: Repleted now improved.  Depression: Continue Zoloft.  Hyperlipidemia: Continue statins.  Peripheral neuropathy, diabetic: Continue Neurontin.  DVT prophylaxis: lovenox Family Communication:none Status is: Inpatient Remains inpatient appropriate because: Right toe osteomyelitis.    Code Status:     Code Status Orders  (From admission, onward)           Start     Ordered   03/24/23 1627  Full code  Continuous       Question:  By:  Answer:  Consent: discussion documented in EHR   03/24/23 1627           Code Status History     Date Active Date Inactive Code Status Order ID Comments User  Context   06/02/2022 1612 06/04/2022 1904 Full Code [id number]  Teddy Spike, DO Inpatient         IV Access:   Peripheral IV   Procedures and diagnostic studies:   No results found.   Medical Consultants:   None.   Subjective:    Donna [name] pain control no complaints.  Objective:    Vitals:   03/27/23 0432 03/27/23 1013 03/27/23 2129 03/28/23 0359  BP: 136/77 118/74 126/62 122/74  Pulse: 69 70 65 60  Resp: 20 16 20 18   Temp: 98 F (36.7 C) 98 F (36.7 C) 97.7 F (36.5 C) 97.7 F (36.5 C)  TempSrc: Oral Oral Oral Oral  SpO2: 97% 98% 98% 95%  Weight:      Height:       SpO2: 95 %   Intake/Output Summary (Last 24 hours) at 03/28/2023 0807 Last data filed at 03/28/2023 0400 Gross per 24 hour  Intake 2048.15 ml  Output --  Net 2048.15 ml   Filed Weights   03/24/23 1334 03/24/23 1703  Weight: 102.1 kg 103.4 kg    Exam: General exam: In no acute distress. Respiratory system: Good air movement and clear to auscultation. Cardiovascular system: S1 & S2 heard, RRR. No JVD. Gastrointestinal system: Abdomen is nondistended, soft and nontender.  Extremities: Foot is wrapped Psychiatry: Judgement and insight appear normal. Mood & affect appropriate. Data Reviewed:    Labs: Basic Metabolic Panel: Recent Labs  Lab 03/24/23 1406 03/24/23 1415 03/25/23 0444 03/26/23 0501 03/27/23 0449  NA 137 138 135 137 134*  K 3.6 3.7 3.3* 3.2* 3.6  CL  101 101 101 104 103  CO2 24  --  22 23 22   GLUCOSE 134* 133* 99 82 151*  BUN 13 11 13 20 17   CREATININE 0.59 0.50 0.56 0.48 0.54  CALCIUM 9.1  --  8.5* 8.5* 8.0*   GFR Estimated Creatinine Clearance: 94.2 mL/min (by C-G formula based on SCr of 0.54 mg/dL). Liver Function Tests: Recent Labs  Lab 03/24/23 1406  AST 17  ALT 27  ALKPHOS 73  BILITOT 1.0  PROT 8.2*  ALBUMIN 4.5   No results for input(s): "LIPASE", "AMYLASE" in the last 168 hours. No results for input(s): "AMMONIA" in the last 168  hours. Coagulation profile No results for input(s): "INR", "PROTIME" in the last 168 hours. COVID-19 Labs  Recent Labs    03/25/23 1043  CRP 6.8*    Lab Results  Component Value Date   SARSCOV2NAA NOT DETECTED 07/20/2019    CBC: Recent Labs  Lab 03/24/23 1406 03/24/23 1415 03/25/23 0444 03/26/23 0501 03/27/23 0449  WBC 11.8*  --  9.4 6.8 5.8  NEUTROABS 9.8*  --   --   --   --   HGB 15.3* 15.6* 14.3 13.3 13.0  HCT 46.1* 46.0 44.6 41.7 39.9  MCV 92.9  --  94.7 95.2 94.8  PLT 256  --  259 235 246   Cardiac Enzymes: No results for input(s): "CKTOTAL", "CKMB", "CKMBINDEX", "TROPONINI" in the last 168 hours. BNP (last 3 results) No results for input(s): "PROBNP" in the last 8760 hours. CBG: Recent Labs  Lab 03/27/23 0752 03/27/23 1148 03/27/23 1646 03/27/23 2130 03/28/23 0728  GLUCAP 117* 149* 180* 210* 136*   D-Dimer: No results for input(s): "DDIMER" in the last 72 hours. Hgb A1c: No results for input(s): "HGBA1C" in the last 72 hours.  Lipid Profile: No results for input(s): "CHOL", "HDL", "LDLCALC", "TRIG", "CHOLHDL", "LDLDIRECT" in the last 72 hours. Thyroid function studies: No results for input(s): "TSH", "T4TOTAL", "T3FREE", "THYROIDAB" in the last 72 hours.  Invalid input(s): "FREET3" Anemia work up: No results for input(s): "VITAMINB12", "FOLATE", "FERRITIN", "TIBC", "IRON", "RETICCTPCT" in the last 72 hours. Sepsis Labs: Recent Labs  Lab 03/24/23 1406 03/24/23 1415 03/25/23 0444 03/26/23 0501 03/27/23 0449  WBC 11.8*  --  9.4 6.8 5.8  LATICACIDVEN  --  1.2  --   --   --    Microbiology Recent Results (from the past 240 hour(s))  Culture, blood (Routine X 2) w Reflex to ID Panel     Status: None (Preliminary result)   Collection Time: 03/24/23  2:06 PM   Specimen: BLOOD  Result Value Ref Range Status   Specimen Description   Final    BLOOD BLOOD LEFT HAND Performed at [facility], [address] [place], [place]  [address]    Special Requests   Final    BOTTLES DRAWN AEROBIC AND ANAEROBIC Blood Culture results may not be optimal due to an excessive volume of blood received in culture bottles Performed at [facility], [address], [place], [place] [address]    Culture   Final    NO GROWTH 3 DAYS Performed at [facility], [address], [place], [place] [address]    Report Status PENDING  Incomplete  Culture, blood (Routine X 2) w Reflex to ID Panel     Status: None (Preliminary result)   Collection Time: 03/24/23  2:06 PM   Specimen: BLOOD  Result Value Ref Range Status   Specimen Description  Final    BLOOD RIGHT ANTECUBITAL Performed at [facility], [address], [place], [place] [address]    Special Requests   Final    BOTTLES DRAWN AEROBIC AND ANAEROBIC Blood Culture adequate volume Performed at [facility], [address], [place], [place] [address]    Culture   Final    NO GROWTH 3 DAYS Performed at [facility], [address], [place], [place] [address]    Report Status PENDING  Incomplete  Surgical PCR screen     Status: Abnormal   Collection Time: 03/25/23  3:04 PM   Specimen: Nasal Mucosa; Nasal Swab  Result Value Ref Range Status   MRSA, PCR NEGATIVE NEGATIVE Final   Staphylococcus aureus POSITIVE (A) NEGATIVE Final    Comment: (NOTE) The Xpert SA Assay (FDA approved for NASAL specimens in patients 76 years of age and older), is one component of a comprehensive surveillance program. It is not intended to diagnose infection nor to guide or monitor treatment. Performed at [facility], [address], [place], [place] [address]   Aerobic/Anaerobic Culture w Gram Stain (surgical/deep wound)     Status: None (Preliminary result)   Collection Time: 03/26/23  7:39 AM   Specimen: PATH Cytology Misc. fluid; Body Fluid  Result Value Ref Range Status   Specimen Description   Final     TOE RT4TH Performed at [facility], [address], [place], [place] [address]    Special Requests   Final    NONE Performed at [facility], [address] [place], [place] [address]    Gram Stain   Final    RARE WBC PRESENT,BOTH PMN AND MONONUCLEAR NO ORGANISMS SEEN    Culture   Final    RARE STAPHYLOCOCCUS AUREUS SUSCEPTIBILITIES TO FOLLOW Performed at [facility], [address] [place], [place] [address]    Report Status PENDING  Incomplete  Aerobic/Anaerobic Culture w Gram Stain (surgical/deep wound)     Status: None (Preliminary result)   Collection Time: 03/26/23  7:40 AM   Specimen: PATH Digit amputation; Tissue  Result Value Ref Range Status   Specimen Description   Final    BONE RT4TH TOE Performed at [facility], [address] [place], [place] [address]    Special Requests   Final    NONE Performed at [facility], [address] [place], [place] [address]    Gram Stain NO WBC SEEN NO ORGANISMS SEEN   Final   Culture   Final    NO GROWTH < 24 HOURS Performed at [facility], [address] [place], [place] [address]    Report Status PENDING  Incomplete     Medications:    atorvastatin  80 mg Oral Daily   empagliflozin  25 mg Oral Daily   enoxaparin (LOVENOX) injection  40 mg Subcutaneous Q24H   gabapentin  300 mg Oral QID   insulin aspart  0-15 Units Subcutaneous TID WC   insulin aspart  0-5 Units Subcutaneous QHS   insulin aspart  4 Units Subcutaneous TID WC   insulin glargine-yfgn  40 Units Subcutaneous QHS   lisinopril  20 mg Oral Daily   mupirocin ointment  1 Application Nasal BID   sertraline  100 mg Oral Daily   Continuous Infusions:  0.9 % NaCl with KCl 20 mEq / L Stopped (03/27/23 1310)   ceFEPime (MAXIPIME) IV  Stopped (03/28/23 7322)   vancomycin Stopped (03/27/23 1159)      LOS: 4 days   Marinda Elk  [facility]  03/28/2023, 8:07 AM

## 2023-03-29 ENCOUNTER — Other Ambulatory Visit (HOSPITAL_COMMUNITY): Payer: Self-pay

## 2023-03-29 DIAGNOSIS — M869 Osteomyelitis, unspecified: Secondary | ICD-10-CM | POA: Diagnosis not present

## 2023-03-29 DIAGNOSIS — L03115 Cellulitis of right lower limb: Secondary | ICD-10-CM | POA: Diagnosis not present

## 2023-03-29 DIAGNOSIS — E118 Type 2 diabetes mellitus with unspecified complications: Secondary | ICD-10-CM | POA: Diagnosis not present

## 2023-03-29 LAB — GLUCOSE, CAPILLARY: Glucose-Capillary: 111 mg/dL — ABNORMAL HIGH (ref 70–99)

## 2023-03-29 NOTE — Progress Notes (Signed)
[facility]  Date of Admission:  03/24/2023   Total days of inpatient antibiotics 5  Principal Problem:   Osteomyelitis of toe of right foot (HCC) Active Problems:   Diabetes mellitus type 2 with complications (HCC)   Hyperlipidemia associated with type 2 diabetes mellitus (HCC)   Depression   Essential hypertension   Diabetic peripheral neuropathy (HCC)   Cellulitis of right foot   Hypokalemia          Assessment: 86 YF admitted with:     #Right 4th metatarsal osteomyelitis status post fourth toe amputation through proximal phalanx on 8/6 #Hx of right 5th tow soteo EP amputation Cx+ MSSA -Back in 10/23 patient had right lateral foot gangrene with fifth toe osteomyelitis, underwent right fifth ray amputation on 1014/23 with Dr. Odis Hollingshead.  Followed by infectious disease, received 2 weeks of p.o. antibiotics for, pathology noted good margins.  Cultures at that time grew MSSA, treated with linezolid. -Patient reports wound healed well following amputation back in October 2023. - She states she went to [facility] and was in the ocean and pool, returned last week.  Denies using hot tub.  She states that she felt well but fatigued after return.  She noted that on Friday she had some right groin pain and noticed erythema around the right foot, fourth toe erythema now has progressed.  Tip of toe appears gangrenous.  Ortho engaged, patient underwent right fourth toe amputation proximal phalanx.  Pathology and culture sent.   -Cx+ MSSA, Path showed benign bone with acute on chronic inflammation. Given this is her second episode of steo with MSSA(different toe) would treat x 6 weeks.   Recommendations:  -Continue linezolid x 2 weeks(EOT 8/19)->cefadroxil 1gm bid x 4 weeks to complete 6 weeks of antibiotics EOT  9/16 - Follow blood cultures. Follow OR Cx   #Diabetes mellitus-poorly controlled - A1c 8.6 ion 8/5    Microbiology:    Antibiotics: 8/4-8/8Vancomycin, 8/5-8/7 cefepime 8/5 metronidazole Linezolid 8/9- Cultures: Blood 8/4 NG Other 8/6OR cx re-incubated  SUBJECTIVE: Resting in bed, inquiring about possible discharge Interval:  Afebrile overnight Review of Systems: Review of Systems  All other systems reviewed and are negative.      PRN Meds:. No Known Allergies  OBJECTIVE: Vitals:   03/28/23 0359 03/28/23 1141 03/28/23 2052 03/29/23 0510  BP: 122/74 110/61 131/81 (!) 142/71  Pulse: 60 73 68 66  Resp: 18 18 16    Temp: 97.7 F (36.5 C) 98.7 F (37.1 C) 97.8 F (36.6 C) 97.7 F (36.5 C)  TempSrc: Oral Oral Oral Oral  SpO2: 95% 98% 98% 100%  Weight:      Height:       Body mass index is 36.79 kg/m.  Physical Exam Constitutional:      Appearance: Normal appearance.  HENT:     Head: Normocephalic and atraumatic.     Right Ear: Tympanic membrane normal.     Left Ear: Tympanic membrane normal.     Nose: Nose normal.     Mouth/Throat:     Mouth: Mucous membranes are moist.  Eyes:     Extraocular Movements: Extraocular movements intact.     Conjunctiva/sclera: Conjunctivae normal.     Pupils: Pupils are equal, round, and reactive to light.  Cardiovascular:     Rate and Rhythm: Normal rate and regular rhythm.     Heart sounds: No murmur heard.    No friction rub. No gallop.  Pulmonary:     Effort: Pulmonary effort is normal.     Breath sounds: Normal breath sounds.  Abdominal:     General: Abdomen is flat.     Palpations: Abdomen is soft.  Skin:    General: Skin is warm and dry.  Neurological:     General: No focal deficit present.     Mental Status: She is alert and oriented to person, place, and time.  Psychiatric:        Mood and Affect: Mood normal.       Lab Results Lab Results  Component Value Date   WBC 6.4 03/29/2023   HGB 14.3 03/29/2023   HCT 43.6 03/29/2023   MCV 95.2 03/29/2023   PLT 318 03/29/2023    Lab Results  Component Value Date    CREATININE 0.58 03/29/2023   BUN 14 03/29/2023   NA 135 03/29/2023   K 3.6 03/29/2023   CL 100 03/29/2023   CO2 24 03/29/2023    Lab Results  Component Value Date   ALT 27 03/24/2023   AST 17 03/24/2023   ALKPHOS 73 03/24/2023   BILITOT 1.0 03/24/2023        Danelle Earthly, MD [facility] 03/29/2023, 9:38 PM I have personally spent 51 minutes involved in face-to-face and non-face-to-face activities for this patient on the day of the visit. Professional time spent includes the following activities: Preparing to see the patient (review of tests), Obtaining and/or reviewing separately obtained history (admission/discharge record), Performing a medically appropriate examination and/or evaluation , Ordering medications/tests/procedures, referring and communicating with other health care professionals, Documenting clinical information in the EMR, Independently interpreting results (not separately reported), Communicating results to the patient/family/caregiver, Counseling and educating the patient/family/caregiver and Care coordination (not separately reported).

## 2023-03-29 NOTE — Discharge Summary (Signed)
Physician Discharge Summary  Donna [name] WGN:[id number] DOB: 08-11-1966 DOA: 03/24/2023  PCP: Myrlene Broker, MD  Admit date: 03/24/2023 Discharge date: 03/29/2023  Admitted From: Home Disposition:  Home  Recommendations for Outpatient Follow-up:  Follow up with ID in 1-2 weeks Please obtain BMP/CBC in one week Follow-up with orthopedic doctor in 2 to 3 weeks  Home Health:No Equipment/Devices:None  Discharge Condition:Stable CODE STATUS:Full Diet recommendation: Heart Healthy   Brief/Interim Summary: 57 y.o. female past medical history of poorly controlled diabetes mellitus type 2, essential hypertension prior right fifth toe osteomyelitis and amputation almost a year ago admitted for right fourth toe cellulitis and concern for osteomyelitis MRI of the foot on 03/25/2023 showed acute osteomyelitis of the distal phalange E on the right fourth toe prominent soft tissue swelling    Discharge Diagnoses:  Principal Problem:   Osteomyelitis of toe of right foot (HCC) Active Problems:   Cellulitis of right foot   Diabetes mellitus type 2 with complications (HCC)   Hyperlipidemia associated with type 2 diabetes mellitus (HCC)   Depression   Essential hypertension   Diabetic peripheral neuropathy (HCC)   Hypokalemia  Acute right foot osteomyelitis  and right foot cellulitis: MRI was positive for osteomyelitis. She will start empirically on antibiotics orthopedic surgery was consulted she status post right fourth toe amputation. Blood cultures on 03/24/2023 have been negative till date. Wound culture grew staph ID recommended to switch to oral Zyvox for 2 weeks and then cefadroxil for 4 weeks.  Poorly controlled diabetes mellitus type 2 with complications: Last A1c of 8.6, she will go back on her home regimen no changes made. It would be a good idea to try her on a newer agent semaglutide as it will help control her blood glucose and help with her  weight.  Hypokalemia: Repleted now improved.  Depression: Continue Zoloft.  Hyperlipidemia: Continue statins.  Peripheral neuropathy: Continue Neurontin.  Discharge Instructions  Discharge Instructions     Diet - low sodium heart healthy   Complete by: As directed    Increase activity slowly   Complete by: As directed       Allergies as of 03/29/2023   No Known Allergies      Medication List     TAKE these medications    acetaminophen 500 MG tablet Commonly known as: TYLENOL Take 1,000 mg by mouth every 6 (six) hours as needed for mild pain or headache.   atorvastatin 80 MG tablet Commonly known as: LIPITOR Take 1 tablet (80 mg total) by mouth daily.   cefadroxil 500 MG capsule Commonly known as: DURICEF Take 2 capsules by mouth 2 times daily for 28 days. Start taking on: April 10, 2023   gabapentin 300 MG capsule Commonly known as: NEURONTIN Take 1 capsule (300 mg total) by mouth 4 (four) times daily.   Jardiance 25 MG Tabs tablet Generic drug: empagliflozin Take 1 tablet (25 mg total) by mouth daily.   linezolid 600 MG tablet Commonly known as: ZYVOX Take 1 tablet (600 mg) by mouth every 12 hours for 12 days.   lisinopril-hydrochlorothiazide 20-25 MG tablet Commonly known as: ZESTORETIC TAKE 1 TABLET BY MOUTH ONCE A DAY   metFORMIN 1000 MG tablet Commonly known as: GLUCOPHAGE TAKE 1 TABLET BY MOUTH TWICE DAILY What changed: how much to take   sertraline 100 MG tablet Commonly known as: ZOLOFT TAKE 1 TABLET BY MOUTH ONCE DAILY What changed: how much to take   tobramycin-dexamethasone ophthalmic solution Commonly known as: TobraDex  Place 1 drop in affected eye(s) every 2 hours for 2 days, then 4 times daily for 5 days. Shake well.   Evaristo Bury FlexTouch 200 UNIT/ML FlexTouch Pen Generic drug: insulin degludec Inject 110 Units into the skin daily as directed What changed: additional instructions   trimethoprim-polymyxin b ophthalmic  solution Commonly known as: Polytrim Place 1 drop into both eyes every 4 (four) hours.        No Known Allergies  Consultations: Orthopedic surgery Infectious disease   Procedures/Studies: MR FOOT RIGHT W WO CONTRAST  Result Date: 03/25/2023 CLINICAL DATA:  Osteomyelitis, foot EXAM: MRI OF THE RIGHT FOREFOOT WITHOUT AND WITH CONTRAST TECHNIQUE: Multiplanar, multisequence MR imaging of the right forefoot was performed before and after the administration of intravenous contrast. CONTRAST:  10mL GADAVIST GADOBUTROL 1 MMOL/ML IV SOLN COMPARISON:  X-ray 03/25/2023, MRI 06/02/2022 FINDINGS: Bones/Joint/Cartilage Interval fifth ray resection the level of the mid fifth metatarsal diaphysis. No erosion or marrow edema within the residual fifth metatarsal. There is bone marrow edema within the distal phalanx of the fourth toe with intermediate to low T1 marrow signal in the distal tuft compatible with acute osteomyelitis. Preserved bone marrow signal within the proximal and middle phalanx of the fourth toe. No additional sites of bone marrow edema or marrow replacement. No fracture or dislocation. Similar degenerative changes of foot. Ligaments Intact Lisfranc ligament.  Intact collateral ligaments. Muscles and Tendons Denervation changes of the foot musculature.  No tenosynovitis. Soft tissues Prominent soft tissue swelling of the fourth toe with suspected cutaneous blistering. No organized or rim enhancing fluid collections within the soft tissues. IMPRESSION: 1. Acute osteomyelitis of the distal phalanx of the right fourth toe. 2. Prominent soft tissue swelling of the fourth toe with suspected cutaneous blistering. 3. Interval transmetatarsal amputation of the fifth ray. No evidence of osteomyelitis within the residual fifth metatarsal. Electronically Signed   By: Duanne Guess D.O.   On: 03/25/2023 11:07   DG Toe 4th Right  Result Date: 03/24/2023 CLINICAL DATA:  Evaluate for osteomyelitis. Patient  complains of discoloration/darkening of the fourth toe. EXAM: RIGHT FOURTH TOE COMPARISON:  None Available. FINDINGS: The AP and oblique images are suboptimal as the distal phalanx is in flexion and obscured by overlying osseous structures. No signs of acute fracture or dislocation. No focal bone erosions identified. There is diffuse soft tissue edema. No signs of acute fracture. IMPRESSION: 1. Suboptimal evaluation of the distal phalanx of the fourth toe. No signs of acute fracture, dislocation or bony erosion of osteomyelitis. 2. Diffuse soft tissue edema. Electronically Signed   By: Signa Kell M.D.   On: 03/24/2023 17:21   CT ANGIO AO+BIFEM W & OR WO CONTRAST  Result Date: 03/24/2023 CLINICAL DATA:  Discoloration/darkening of fourth toe. Evaluate for arterial dissection. EXAM: CT ANGIOGRAPHY OF ABDOMINAL AORTA WITH ILIOFEMORAL RUNOFF TECHNIQUE: Multidetector CT imaging of the abdomen, pelvis and lower extremities was performed using the standard protocol during bolus administration of intravenous contrast. Multiplanar CT image reconstructions and MIPs were obtained to evaluate the vascular anatomy. RADIATION DOSE REDUCTION: This exam was performed according to the departmental dose-optimization program which includes automated exposure control, adjustment of the mA and/or kV according to patient size and/or use of iterative reconstruction technique. CONTRAST:  OMNIPAQUE IOHEXOL 350 MG/ML SOLN COMPARISON:  None Available. FINDINGS: VASCULAR Aorta: Normal caliber aorta without aneurysm, dissection, vasculitis or significant stenosis. Mild aortic atherosclerotic calcifications. Celiac: Patent without evidence of aneurysm, dissection, vasculitis or significant stenosis. SMA: Patent without evidence of  aneurysm, dissection, vasculitis or significant stenosis. Renals: Both renal arteries are patent without evidence of aneurysm, dissection, vasculitis, fibromuscular dysplasia or significant stenosis. IMA:  Patent without evidence of aneurysm, dissection, vasculitis or significant stenosis. RIGHT Lower Extremity Inflow: Common, internal and external iliac arteries are patent without evidence of aneurysm, dissection, vasculitis or significant stenosis. Outflow: Common, superficial and profunda femoral arteries and the popliteal artery are patent without evidence of aneurysm, dissection, vasculitis or significant stenosis. Runoff: Patent three vessel runoff to the ankle. LEFT Lower Extremity Inflow: Common, internal and external iliac arteries are patent without evidence of aneurysm, dissection, vasculitis or significant stenosis. Outflow: Common, superficial and profunda femoral arteries and the popliteal artery are patent without evidence of aneurysm, dissection, vasculitis or significant stenosis. Runoff: Patent three vessel runoff to the ankle. Veins: No obvious venous abnormality within the limitations of this arterial phase study. Review of the MIP images confirms the above findings. NON-VASCULAR Lower chest: No acute abnormality. Hepatobiliary: No focal liver abnormality is seen. Status post cholecystectomy. No biliary dilatation. Pancreas: Unremarkable. No pancreatic ductal dilatation or surrounding inflammatory changes. Spleen: Normal in size without focal abnormality. Adrenals/Urinary Tract: Adrenal glands are unremarkable. Kidneys are normal, without renal calculi, focal lesion, or hydronephrosis. Bladder is unremarkable. Stomach/Bowel: Stomach is within normal limits. Appendix appears normal. No evidence of bowel wall thickening, distention, or inflammatory changes. Lymphatic: No enlarged abdominopelvic lymph nodes. Reproductive: Uterus and bilateral adnexa are unremarkable. Other: No free fluid or fluid collections. Musculoskeletal: No acute or significant osseous findings. IMPRESSION: 1. No evidence of arterial dissection or significant stenosis. 2. Non-vascular. No acute abdominopelvic abnormality. 3.  Aortic Atherosclerosis (ICD10-I70.0). Electronically Signed   By: Signa Kell M.D.   On: 03/24/2023 15:55     Subjective: No complains  Discharge Exam: Vitals:   03/28/23 2052 03/29/23 0510  BP: 131/81 (!) 142/71  Pulse: 68 66  Resp: 16   Temp: 97.8 F (36.6 C) 97.7 F (36.5 C)  SpO2: 98% 100%   Vitals:   03/28/23 0359 03/28/23 1141 03/28/23 2052 03/29/23 0510  BP: 122/74 110/61 131/81 (!) 142/71  Pulse: 60 73 68 66  Resp: 18 18 16    Temp: 97.7 F (36.5 C) 98.7 F (37.1 C) 97.8 F (36.6 C) 97.7 F (36.5 C)  TempSrc: Oral Oral Oral Oral  SpO2: 95% 98% 98% 100%  Weight:      Height:        General: Pt is alert, awake, not in acute distress Cardiovascular: RRR, S1/S2 +, no rubs, no gallops Respiratory: CTA bilaterally, no wheezing, no rhonchi Abdominal: Soft, NT, ND, bowel sounds + Extremities: no edema, no cyanosis    The results of significant diagnostics from this hospitalization (including imaging, microbiology, ancillary and laboratory) are listed below for reference.     Microbiology: Recent Results (from the past 240 hour(s))  Culture, blood (Routine X 2) w Reflex to ID Panel     Status: None   Collection Time: 03/24/23  2:06 PM   Specimen: BLOOD  Result Value Ref Range Status   Specimen Description   Final    BLOOD BLOOD LEFT HAND Performed at [facility], [address] [place], [place] [address]    Special Requests   Final    BOTTLES DRAWN AEROBIC AND ANAEROBIC Blood Culture results may not be optimal due to an excessive volume of blood received in culture bottles Performed at [facility], [address], [place], [place] [address]    Culture  Final    NO GROWTH 5 DAYS Performed at [facility], [address], [place], [place] [address]    Report Status 03/29/2023 FINAL  Final  Culture, blood (Routine X 2) w Reflex to ID Panel     Status: None   Collection Time: 03/24/23  2:06 PM   Specimen:  BLOOD  Result Value Ref Range Status   Specimen Description   Final    BLOOD RIGHT ANTECUBITAL Performed at [facility], [address] [place], [place] [address]    Special Requests   Final    BOTTLES DRAWN AEROBIC AND ANAEROBIC Blood Culture adequate volume Performed at [facility], [address] [place], [place] [address]    Culture   Final    NO GROWTH 5 DAYS Performed at [facility], [address], [place], [place] [address]    Report Status 03/29/2023 FINAL  Final  Surgical PCR screen     Status: Abnormal   Collection Time: 03/25/23  3:04 PM   Specimen: Nasal Mucosa; Nasal Swab  Result Value Ref Range Status   MRSA, PCR NEGATIVE NEGATIVE Final   Staphylococcus aureus POSITIVE (A) NEGATIVE Final    Comment: (NOTE) The Xpert SA Assay (FDA approved for NASAL specimens in patients 73 years of age and older), is one component of a comprehensive surveillance program. It is not intended to diagnose infection nor to guide or monitor treatment. Performed at [facility], [address] [place], [place] [address]   Aerobic/Anaerobic Culture w Gram Stain (surgical/deep wound)     Status: None (Preliminary result)   Collection Time: 03/26/23  7:39 AM   Specimen: PATH Cytology Misc. fluid; Body Fluid  Result Value Ref Range Status   Specimen Description   Final    TOE RT4TH Performed at [facility], [address], [place], [place] [address]    Special Requests   Final    NONE Performed at [facility], [address], [place], [place] [address]    Gram Stain   Final    RARE WBC PRESENT,BOTH PMN AND MONONUCLEAR NO ORGANISMS SEEN Performed at [facility], [address], [place], [place] [address]    Culture   Final    RARE STAPHYLOCOCCUS AUREUS NO ANAEROBES ISOLATED; CULTURE IN PROGRESS FOR 5 DAYS    Report Status PENDING  Incomplete   Organism ID, Bacteria  STAPHYLOCOCCUS AUREUS  Final      Susceptibility   Staphylococcus aureus - MIC*    CIPROFLOXACIN <=0.5 SENSITIVE Sensitive     ERYTHROMYCIN <=0.25 SENSITIVE Sensitive     GENTAMICIN <=0.5 SENSITIVE Sensitive     OXACILLIN 0.5 SENSITIVE Sensitive     TETRACYCLINE <=1 SENSITIVE Sensitive     VANCOMYCIN <=0.5 SENSITIVE Sensitive     TRIMETH/SULFA <=10 SENSITIVE Sensitive     CLINDAMYCIN <=0.25 SENSITIVE Sensitive     RIFAMPIN <=0.5 SENSITIVE Sensitive     Inducible Clindamycin NEGATIVE Sensitive     LINEZOLID 2 SENSITIVE Sensitive     * RARE STAPHYLOCOCCUS AUREUS  Aerobic/Anaerobic Culture w Gram Stain (surgical/deep wound)     Status: None (Preliminary result)   Collection Time: 03/26/23  7:40 AM   Specimen: PATH Digit amputation; Tissue  Result Value Ref Range Status   Specimen Description   Final    BONE RT4TH TOE Performed at [facility], [address] [place], [place] [address]    Special  Requests   Final    NONE Performed at [facility], [address], [place], [place] [address]    Gram Stain NO WBC SEEN NO ORGANISMS SEEN   Final   Culture   Final    NO GROWTH 2 DAYS NO ANAEROBES ISOLATED; CULTURE IN PROGRESS FOR 5 DAYS Performed at [facility], [address], [place], [place] [address]    Report Status PENDING  Incomplete     Labs: BNP (last 3 results) No results for input(s): "BNP" in the last 8760 hours. Basic Metabolic Panel: Recent Labs  Lab 03/25/23 0444 03/26/23 0501 03/27/23 0449 03/28/23 0958 03/29/23 0509  NA 135 137 134* 138 135  K 3.3* 3.2* 3.6 3.6 3.6  CL 101 104 103 107 100  CO2 22 23 22  21* 24  GLUCOSE 99 82 151* 146* 135*  BUN 13 20 17 16 14   CREATININE 0.56 0.48 0.54 0.52 0.58  CALCIUM 8.5* 8.5* 8.0* 8.5* 8.3*   Liver Function Tests: Recent Labs  Lab 03/24/23 1406  AST 17  ALT 27  ALKPHOS 73  BILITOT 1.0  PROT 8.2*  ALBUMIN 4.5   No results for input(s): "LIPASE", "AMYLASE" in  the last 168 hours. No results for input(s): "AMMONIA" in the last 168 hours. CBC: Recent Labs  Lab 03/24/23 1406 03/24/23 1415 03/25/23 0444 03/26/23 0501 03/27/23 0449 03/28/23 0958 03/29/23 0509  WBC 11.8*  --  9.4 6.8 5.8 5.1 6.4  NEUTROABS 9.8*  --   --   --   --   --   --   HGB 15.3*   < > 14.3 13.3 13.0 13.6 14.3  HCT 46.1*   < > 44.6 41.7 39.9 43.0 43.6  MCV 92.9  --  94.7 95.2 94.8 95.1 95.2  PLT 256  --  259 235 246 272 318   < > = values in this interval not displayed.   Cardiac Enzymes: No results for input(s): "CKTOTAL", "CKMB", "CKMBINDEX", "TROPONINI" in the last 168 hours. BNP: Invalid input(s): "POCBNP" CBG: Recent Labs  Lab 03/28/23 0728 03/28/23 1137 03/28/23 1632 03/28/23 2049 03/29/23 0739  GLUCAP 136* 213* 136* 143* 111*   D-Dimer No results for input(s): "DDIMER" in the last 72 hours. Hgb A1c No results for input(s): "HGBA1C" in the last 72 hours. Lipid Profile No results for input(s): "CHOL", "HDL", "LDLCALC", "TRIG", "CHOLHDL", "LDLDIRECT" in the last 72 hours. Thyroid function studies No results for input(s): "TSH", "T4TOTAL", "T3FREE", "THYROIDAB" in the last 72 hours.  Invalid input(s): "FREET3" Anemia work up No results for input(s): "VITAMINB12", "FOLATE", "FERRITIN", "TIBC", "IRON", "RETICCTPCT" in the last 72 hours. Urinalysis    Component Value Date/Time   LABSPEC >=1.030 12/17/2010 1645   PHURINE 5.0 12/17/2010 1645   GLUCOSEU 500 (A) 12/17/2010 1645   HGBUR LARGE (A) 12/17/2010 1645   BILIRUBINUR MODERATE (A) 12/17/2010 1645   KETONESUR 15 (A) 12/17/2010 1645   PROTEINUR >=300 (A) 12/17/2010 1645   UROBILINOGEN 1.0 12/17/2010 1645   NITRITE POSITIVE (A) 12/17/2010 1645   LEUKOCYTESUR (A) 12/17/2010 1645    TRACE Biochemical Testing Only. Please order routine urinalysis from main lab if confirmatory testing is needed.   Sepsis Labs Recent Labs  Lab 03/26/23 0501 03/27/23 0449 03/28/23 0958 03/29/23 0509  WBC 6.8 5.8  5.1 6.4   Microbiology Recent Results (from the past 240 hour(s))  Culture, blood (Routine X 2) w Reflex to ID Panel     Status: None   Collection Time:  03/24/23  2:06 PM   Specimen: BLOOD  Result Value Ref Range Status   Specimen Description   Final    BLOOD BLOOD LEFT HAND Performed at [facility], [address], [place], [place] [address]    Special Requests   Final    BOTTLES DRAWN AEROBIC AND ANAEROBIC Blood Culture results may not be optimal due to an excessive volume of blood received in culture bottles Performed at [facility], [address] [place], [place] [address]    Culture   Final    NO GROWTH 5 DAYS Performed at [facility], [address] [place], [place] [address]    Report Status 03/29/2023 FINAL  Final  Culture, blood (Routine X 2) w Reflex to ID Panel     Status: None   Collection Time: 03/24/23  2:06 PM   Specimen: BLOOD  Result Value Ref Range Status   Specimen Description   Final    BLOOD RIGHT ANTECUBITAL Performed at [facility], [address], [place], [place] [address]    Special Requests   Final    BOTTLES DRAWN AEROBIC AND ANAEROBIC Blood Culture adequate volume Performed at [facility], [address], [place], [place] [address]    Culture   Final    NO GROWTH 5 DAYS Performed at [facility], [address] [place], [place] [address]    Report Status 03/29/2023 FINAL  Final  Surgical PCR screen     Status: Abnormal   Collection Time: 03/25/23  3:04 PM   Specimen: Nasal Mucosa; Nasal Swab  Result Value Ref Range Status   MRSA, PCR NEGATIVE NEGATIVE Final   Staphylococcus aureus POSITIVE (A) NEGATIVE Final    Comment: (NOTE) The Xpert SA Assay (FDA approved for NASAL specimens in patients 90 years of age and older), is one component of a comprehensive surveillance program. It is not intended to diagnose infection nor to guide or monitor  treatment. Performed at [facility], [address], [place], [place] [address]   Aerobic/Anaerobic Culture w Gram Stain (surgical/deep wound)     Status: None (Preliminary result)   Collection Time: 03/26/23  7:39 AM   Specimen: PATH Cytology Misc. fluid; Body Fluid  Result Value Ref Range Status   Specimen Description   Final    TOE RT4TH Performed at [facility], [address] [place], [place] [address]    Special Requests   Final    NONE Performed at [facility], [address], [place], [place] [address]    Gram Stain   Final    RARE WBC PRESENT,BOTH PMN AND MONONUCLEAR NO ORGANISMS SEEN Performed at [facility], [address] [place], [place] [address]    Culture   Final    RARE STAPHYLOCOCCUS AUREUS NO ANAEROBES ISOLATED; CULTURE IN PROGRESS FOR 5 DAYS    Report Status PENDING  Incomplete   Organism ID, Bacteria STAPHYLOCOCCUS AUREUS  Final      Susceptibility   Staphylococcus aureus - MIC*    CIPROFLOXACIN <=0.5 SENSITIVE Sensitive     ERYTHROMYCIN <=0.25 SENSITIVE Sensitive     GENTAMICIN <=0.5 SENSITIVE Sensitive     OXACILLIN 0.5 SENSITIVE Sensitive     TETRACYCLINE <=1 SENSITIVE Sensitive     VANCOMYCIN <=0.5 SENSITIVE Sensitive     TRIMETH/SULFA <=10 SENSITIVE Sensitive     CLINDAMYCIN <=0.25 SENSITIVE Sensitive     RIFAMPIN <=0.5  SENSITIVE Sensitive     Inducible Clindamycin NEGATIVE Sensitive     LINEZOLID 2 SENSITIVE Sensitive     * RARE STAPHYLOCOCCUS AUREUS  Aerobic/Anaerobic Culture w Gram Stain (surgical/deep wound)     Status: None (Preliminary result)   Collection Time: 03/26/23  7:40 AM   Specimen: PATH Digit amputation; Tissue  Result Value Ref Range Status   Specimen Description   Final    BONE RT4TH TOE Performed at [facility], [address] [place], [place] [address]    Special Requests   Final    NONE Performed at [facility],  [address], [place], [place] [address]    Gram Stain NO WBC SEEN NO ORGANISMS SEEN   Final   Culture   Final    NO GROWTH 2 DAYS NO ANAEROBES ISOLATED; CULTURE IN PROGRESS FOR 5 DAYS Performed at [facility], [address], [place], [place] [address]    Report Status PENDING  Incomplete     SIGNED:   Marinda Elk, MD  [facility] 03/29/2023, 10:40 AM Pager   If 7PM-7AM, please contact night-coverage [email or web address] Password [email or web address]

## 2023-03-29 NOTE — Plan of Care (Signed)

## 2023-03-31 LAB — AEROBIC/ANAEROBIC CULTURE W GRAM STAIN (SURGICAL/DEEP WOUND)
Culture: NO GROWTH
Gram Stain: NONE SEEN

## 2023-04-01 ENCOUNTER — Telehealth: Payer: Self-pay

## 2023-04-01 NOTE — Transitions of Care (Post Inpatient/ED Visit) (Signed)
04/01/2023  Name: Donna [name] MRN: [id number] DOB: [birth year]  Today's TOC FU Call Status: Today's TOC FU Call Status:: Successful TOC FU Call Completed TOC FU Call Complete Date: 04/01/23  Transition Care Management Follow-up Telephone Call Date of Discharge: 03/29/23 Discharge Facility: [facility]) Type of Discharge: Inpatient Admission Primary Inpatient Discharge Diagnosis:: Osteomyelitis of toe of right foot How have you been since you were released from the hospital?: Better Any questions or concerns?: No  Items Reviewed: Did you receive and understand the discharge instructions provided?: Yes Medications obtained,verified, and reconciled?: Yes (Medications Reviewed) Any new allergies since your discharge?: No Dietary orders reviewed?: Yes Do you have support at home?: Yes  Medications Reviewed Today: Medications Reviewed Today     Reviewed by Merleen Nicely, LPN (Licensed Practical Nurse) on 04/01/23 at 1025  Med List Status: <None>   Medication Order Taking? Sig Documenting Provider Last Dose Status Informant  acetaminophen (TYLENOL) 500 MG tablet [id number] Yes Take 1,000 mg by mouth every 6 (six) hours as needed for mild pain or headache. [provider] Taking Active Self  atorvastatin (LIPITOR) 80 MG tablet [id number] Yes Take 1 tablet (80 mg total) by mouth daily. Myrlene Broker, MD Taking Active Self  cefadroxil (DURICEF) 500 MG capsule [id number] Yes Take 2 capsules by mouth 2 times daily for 28 days. Danelle Earthly, MD Taking Active   gabapentin (NEURONTIN) 300 MG capsule [id number] Yes Take 1 capsule (300 mg total) by mouth 4 (four) times daily. Myrlene Broker, MD Taking Active Self  insulin degludec (TRESIBA FLEXTOUCH) 200 UNIT/ML FlexTouch Pen [id number] Yes Inject 110 Units into the skin daily as directed  Patient taking differently: Inject 110 Units into the skin daily. Patient states she takes 100 units QAM   Myrlene Broker, MD Taking Active Self    Discontinued 10/25/20 1452   JARDIANCE 25 MG TABS tablet [id number] Yes Take 1 tablet (25 mg total) by mouth daily. Myrlene Broker, MD Taking Active Self  linezolid (ZYVOX) 600 MG tablet [id number] Yes Take 1 tablet (600 mg) by mouth every 12 hours for 12 days. Danelle Earthly, MD Taking Active   lisinopril-hydrochlorothiazide (ZESTORETIC) 20-25 MG tablet [id number] Yes TAKE 1 TABLET BY MOUTH ONCE A DAY Myrlene Broker, MD Taking Active Self  metFORMIN (GLUCOPHAGE) 1000 MG tablet [id number] Yes TAKE 1 TABLET BY MOUTH TWICE DAILY  Patient taking differently: Take 1,000 mg by mouth 2 (two) times daily.   Myrlene Broker, MD Taking Active Self  sertraline (ZOLOFT) 100 MG tablet [id number] Yes TAKE 1 TABLET BY MOUTH ONCE DAILY  Patient taking differently: Take 100 mg by mouth daily.   Myrlene Broker, MD Taking Active Self  tobramycin-dexamethasone [facility]) ophthalmic solution [id number] Yes Place 1 drop in affected eye(s) every 2 hours for 2 days, then 4 times daily for 5 days. Shake well.  Taking Active Self  trimethoprim-polymyxin b (POLYTRIM) ophthalmic solution [id number] Yes Place 1 drop into both eyes every 4 (four) hours. Bennie Pierini, FNP Taking Active Self            Home Care and Equipment/Supplies: Were Home Health Services Ordered?: No Any new equipment or medical supplies ordered?: No  Functional Questionnaire: Do you need assistance with bathing/showering or dressing?: No Do you need assistance with meal preparation?: No Do you need assistance with eating?: No Do you have difficulty maintaining continence: No Do you need assistance with getting out of bed/getting out of a chair/moving?: No Do  you have difficulty managing or taking your medications?: No  Follow up appointments reviewed: PCP Follow-up appointment confirmed?: No MD Provider Line Number:[phone] Given: Yes Specialist Hospital Follow-up appointment  confirmed?: Yes Date of Specialist follow-up appointment?: 04/18/23 Follow-Up Specialty Provider:: Dr Thedore Mins Do you need transportation to your follow-up appointment?: No Do you understand care options if your condition(s) worsen?: Yes-patient verbalized understanding    SIGNATURE  Woodfin Ganja LPN [facility] Nurse Health Advisor Direct Dial [phone]

## 2023-04-04 ENCOUNTER — Other Ambulatory Visit: Payer: Self-pay | Admitting: Internal Medicine

## 2023-04-04 ENCOUNTER — Other Ambulatory Visit (HOSPITAL_COMMUNITY): Payer: Self-pay

## 2023-04-04 DIAGNOSIS — L02611 Cutaneous abscess of right foot: Secondary | ICD-10-CM | POA: Diagnosis not present

## 2023-04-04 MED ORDER — MELOXICAM 7.5 MG PO TABS
7.5000 mg | ORAL_TABLET | Freq: Every day | ORAL | 0 refills | Status: DC | PRN
  Filled 2023-04-04: qty 30, 30d supply, fill #0

## 2023-04-04 MED ORDER — TRESIBA FLEXTOUCH 200 UNIT/ML ~~LOC~~ SOPN
110.0000 [IU] | PEN_INJECTOR | Freq: Every day | SUBCUTANEOUS | 0 refills | Status: DC
  Filled 2023-04-04: qty 15, 27d supply, fill #0

## 2023-04-05 ENCOUNTER — Other Ambulatory Visit (HOSPITAL_COMMUNITY): Payer: Self-pay

## 2023-04-17 DIAGNOSIS — M9903 Segmental and somatic dysfunction of lumbar region: Secondary | ICD-10-CM | POA: Diagnosis not present

## 2023-04-17 DIAGNOSIS — M9901 Segmental and somatic dysfunction of cervical region: Secondary | ICD-10-CM | POA: Diagnosis not present

## 2023-04-17 DIAGNOSIS — M50322 Other cervical disc degeneration at C5-C6 level: Secondary | ICD-10-CM | POA: Diagnosis not present

## 2023-04-17 DIAGNOSIS — M5137 Other intervertebral disc degeneration, lumbosacral region: Secondary | ICD-10-CM | POA: Diagnosis not present

## 2023-04-18 ENCOUNTER — Ambulatory Visit: Payer: Commercial Managed Care - PPO | Admitting: Internal Medicine

## 2023-04-18 ENCOUNTER — Other Ambulatory Visit: Payer: Self-pay

## 2023-04-18 ENCOUNTER — Encounter: Payer: Self-pay | Admitting: Internal Medicine

## 2023-04-18 VITALS — BP 130/81 | HR 78 | Temp 97.5°F | Ht 66.0 in | Wt 231.0 lb

## 2023-04-18 DIAGNOSIS — M869 Osteomyelitis, unspecified: Secondary | ICD-10-CM

## 2023-04-18 DIAGNOSIS — E1140 Type 2 diabetes mellitus with diabetic neuropathy, unspecified: Secondary | ICD-10-CM | POA: Diagnosis not present

## 2023-04-18 NOTE — Progress Notes (Addendum)
Patient Active Problem List   Diagnosis Date Noted   Hypokalemia 03/27/2023   Osteomyelitis of toe of right foot (HCC) 03/24/2023   Medication monitoring encounter 06/13/2022   Gangrene of right foot (HCC) 06/12/2022   Cellulitis of right foot 06/03/2022   Osteomyelitis of fifth toe of right foot (HCC) 06/02/2022   Toenail deformity 09/01/2021   Mouth ulcer 02/28/2021   Right leg pain 06/15/2020   Pre-ulcerative calluses 06/09/2020   Posterior calcaneal exostosis 07/13/2019   Diabetic peripheral neuropathy (HCC) 12/01/2014   Routine general medical examination at a health care facility 07/18/2013   Restless leg syndrome 06/02/2012   Diabetes mellitus type 2 with complications (HCC) 08/22/2010   Hyperlipidemia associated with type 2 diabetes mellitus (HCC) 08/22/2010   Morbid obesity (HCC) 08/22/2010   Depression 08/22/2010   Essential hypertension 08/22/2010   GERD (gastroesophageal reflux disease) 08/22/2010    Patient's Medications  New Prescriptions   No medications on file  Previous Medications   ACETAMINOPHEN (TYLENOL) 500 MG TABLET    Take 1,000 mg by mouth every 6 (six) hours as needed for mild pain or headache.   ATORVASTATIN (LIPITOR) 80 MG TABLET    Take 1 tablet (80 mg total) by mouth daily.   CEFADROXIL (DURICEF) 500 MG CAPSULE    Take 2 capsules by mouth 2 times daily for 28 days.   GABAPENTIN (NEURONTIN) 300 MG CAPSULE    Take 1 capsule (300 mg total) by mouth 4 (four) times daily.   INSULIN DEGLUDEC (TRESIBA FLEXTOUCH) 200 UNIT/ML FLEXTOUCH PEN    Inject 110 Units into the skin daily. Follow-up is appt must see provider for future refills   JARDIANCE 25 MG TABS TABLET    Take 1 tablet (25 mg total) by mouth daily.   LISINOPRIL-HYDROCHLOROTHIAZIDE (ZESTORETIC) 20-25 MG TABLET    TAKE 1 TABLET BY MOUTH ONCE A DAY   MELOXICAM (MOBIC) 7.5 MG TABLET    Take 1 tablet (7.5 mg total) by mouth daily as needed.   METFORMIN (GLUCOPHAGE) 1000 MG TABLET    TAKE 1  TABLET BY MOUTH TWICE DAILY   SERTRALINE (ZOLOFT) 100 MG TABLET    TAKE 1 TABLET BY MOUTH ONCE DAILY   TOBRAMYCIN-DEXAMETHASONE (TOBRADEX) OPHTHALMIC SOLUTION    Place 1 drop in affected eye(s) every 2 hours for 2 days, then 4 times daily for 5 days. Shake well.   TRIMETHOPRIM-POLYMYXIN B (POLYTRIM) OPHTHALMIC SOLUTION    Place 1 drop into both eyes every 4 (four) hours.  Modified Medications   No medications on file  Discontinued Medications   No medications on file    Subjective:  67 YF with PMHX as below including DM, Hx of right 5th to4 osteo SP amputation Cx+ MSSA presents for hospital f/u of right 4th metatarsal osteomyelitis status post fourth toe amputation through proximal phalanx on 8/6. Back n 10/23 patient had right lateral foot gangrene with fifth toe osteomyelitis, underwent right fifth ray amputation on 1014/23 with Dr. Odis Hollingshead.  Followed by infectious disease, received 2 weeks of p.o. antibiotics for, pathology noted good margins.  Cultures at that time grew MSSA, treated with linezolid. Wound healed well following amputation back in October 2023. She went to [facility] and was in the ocean and pool .felt well but fatigued after return.  She noted that on Friday she had some right groin pain and noticed erythema around the right foot, fourth toe erythema now has progressed.  Tip of toe  appears gangrenous.  Ortho engaged, patient underwent right fourth toe amputation proximal phalanx.  Pathology and culture sent.  Cx+ MSSA, Path showed benign bone with acute on chronic inflammation. Given this is her second episode of steo with MSSA(different toe) would treat x 6 weeks.  linezolid x 2 weeks(EOT 8/19)->cefadroxil 1gm bid x 4 weeks to complete 6 weeks of antibiotics EOT  9/16 Today: tolerating abx. No new complaints   Review of Systems: Review of Systems  All other systems reviewed and are negative.   Past Medical History:  Diagnosis Date   Diabetes mellitus    type 2-  uncontrolled   Hyperlipidemia    Hypertension    Lumbar disc disease    Obesity     Social History   Tobacco Use   Smoking status: Never   Smokeless tobacco: Never  Substance Use Topics   Alcohol use: No   Drug use: No    Family History  Problem Relation Age of Onset   Other Father        CHF   Coronary artery disease Father    Cancer Father        renal cell carcinoma   Hypertension Father    Diabetes Father    Diabetes Mother    Coronary artery disease Mother    Heart attack Mother    Cancer Paternal Aunt        Breast   Breast cancer Paternal Aunt 72   Atrial fibrillation Other    Peripheral vascular disease Other    Heart disease Other     No Known Allergies  Health Maintenance  Topic Date Due   Hepatitis C Screening  Never done   Colonoscopy  Never done   PAP SMEAR-Modifier  09/19/2013   Zoster Vaccines- Shingrix (1 of 2) Never done   OPHTHALMOLOGY EXAM  10/18/2020   COVID-19 Vaccine (6 - 2023-24 season) 04/20/2022   Diabetic kidney evaluation - Urine ACR  08/31/2022   INFLUENZA VACCINE  03/21/2023   HEMOGLOBIN A1C  09/25/2023   FOOT EXAM  12/11/2023   MAMMOGRAM  03/14/2024   Diabetic kidney evaluation - eGFR measurement  03/28/2024   DTaP/Tdap/Td (2 - Td or Tdap) 05/25/2024   HIV Screening  Completed   HPV VACCINES  Aged Out    Objective:  Vitals:   04/18/23 1609  BP: 130/81  Pulse: 78  Temp: (!) 97.5 F (36.4 C)  TempSrc: Temporal  SpO2: 97%  Weight: 231 lb (104.8 kg)  Height: 5\' 6"  (1.676 m)   Body mass index is 37.28 kg/m.  Physical Exam Constitutional:      Appearance: Normal appearance.  HENT:     Head: Normocephalic and atraumatic.     Right Ear: Tympanic membrane normal.     Left Ear: Tympanic membrane normal.     Nose: Nose normal.     Mouth/Throat:     Mouth: Mucous membranes are moist.  Eyes:     Extraocular Movements: Extraocular movements intact.     Conjunctiva/sclera: Conjunctivae normal.     Pupils: Pupils are  equal, round, and reactive to light.  Cardiovascular:     Rate and Rhythm: Normal rate and regular rhythm.     Heart sounds: No murmur heard.    No friction rub. No gallop.  Pulmonary:     Effort: Pulmonary effort is normal.     Breath sounds: Normal breath sounds.  Abdominal:     General: Abdomen is flat.     Palpations:  Abdomen is soft.  Skin:    General: Skin is warm and dry.  Neurological:     General: No focal deficit present.     Mental Status: She is alert and oriented to person, place, and time.  Psychiatric:        Mood and Affect: Mood normal.     Lab Results Lab Results  Component Value Date   WBC 6.4 03/29/2023   HGB 14.3 03/29/2023   HCT 43.6 03/29/2023   MCV 95.2 03/29/2023   PLT 318 03/29/2023    Lab Results  Component Value Date   CREATININE 0.58 03/29/2023   BUN 14 03/29/2023   NA 135 03/29/2023   K 3.6 03/29/2023   CL 100 03/29/2023   CO2 24 03/29/2023    Lab Results  Component Value Date   ALT 27 03/24/2023   AST 17 03/24/2023   ALKPHOS 73 03/24/2023   BILITOT 1.0 03/24/2023    Lab Results  Component Value Date   CHOL 124 08/31/2021   HDL 41.50 08/31/2021   LDLCALC 53 08/31/2021   LDLDIRECT 137.0 11/30/2019   TRIG 152.0 (H) 08/31/2021   CHOLHDL 3 08/31/2021   No results found for: "LABRPR", "RPRTITER" No results found for: "HIV1RNAQUANT", "HIV1RNAVL", "CD4TABS"   Problem List Items Addressed This Visit   None  Assessment/Plan #Right 4th metatarsal osteomyelitis status post fourth toe amputation through proximal phalanx on 8/6 #Hx of right 5th toe osteo SP amputation Cx+ MSSA -Discharged on linezolid x 2 weeks(EOT 8/19)->cefadroxil 1gm bid x 4 weeks to complete 6 weeks of antibiotics EOT 9/16  -Doing well on cefadroxil. No concerns for active infection today. -Seen by. Orhto today, noted doing well, F/U 3 weeks for suture removal Plan: -Labs today -Complete abx with cefadroxil EOT 05/06/23 -F/U with ID in one month to do labs off  of abx.   #Diabetes mellitus-poorly controlled - A1c 8.6 on 8/5 -Needs glycemic control for optimal wound healing   Danelle Earthly, MD [facility] 04/18/2023, 4:12 PM  I have personally spent 45 minutes involved in face-to-face and non-face-to-face activities for this patient on the day of the visit. Professional time spent includes the following activities: Preparing to see the patient (review of tests), Obtaining and/or reviewing separately obtained history (admission/discharge record), Performing a medically appropriate examination and/or evaluation , Ordering medications/tests/procedures, referring and communicating with other health care professionals, Documenting clinical information in the EMR, Independently interpreting results (not separately reported), Communicating results to the patient/family/caregiver, Counseling and educating the patient/family/caregiver and Care coordination (not separately reported).

## 2023-04-19 LAB — COMPLETE METABOLIC PANEL WITH GFR
AG Ratio: 2 (calc) (ref 1.0–2.5)
ALT: 20 U/L (ref 6–29)
AST: 13 U/L (ref 10–35)
Albumin: 4.5 g/dL (ref 3.6–5.1)
Alkaline phosphatase (APISO): 66 U/L (ref 37–153)
BUN: 21 mg/dL (ref 7–25)
CO2: 25 mmol/L (ref 20–32)
Calcium: 9 mg/dL (ref 8.6–10.4)
Chloride: 100 mmol/L (ref 98–110)
Creat: 0.87 mg/dL (ref 0.50–1.03)
Globulin: 2.3 g/dL (ref 1.9–3.7)
Glucose, Bld: 184 mg/dL — ABNORMAL HIGH (ref 65–99)
Potassium: 3.7 mmol/L (ref 3.5–5.3)
Sodium: 137 mmol/L (ref 135–146)
Total Bilirubin: 0.6 mg/dL (ref 0.2–1.2)
Total Protein: 6.8 g/dL (ref 6.1–8.1)
eGFR: 78 mL/min/{1.73_m2} (ref >=60)

## 2023-04-19 LAB — CBC WITH DIFFERENTIAL/PLATELET
Absolute Monocytes: 492 {cells}/uL (ref 200–950)
Basophils Absolute: 12 {cells}/uL (ref 0–200)
Basophils Relative: 0.2 %
Eosinophils Absolute: 108 {cells}/uL (ref 15–500)
Eosinophils Relative: 1.8 %
HCT: 41 % (ref 35.0–45.0)
Hemoglobin: 14 g/dL (ref 11.7–15.5)
Lymphs Abs: 1920 {cells}/uL (ref 850–3900)
MCH: 30.8 pg (ref 27.0–33.0)
MCHC: 34.1 g/dL (ref 32.0–36.0)
MCV: 90.1 fL (ref 80.0–100.0)
MPV: 9.6 fL (ref 7.5–12.5)
Monocytes Relative: 8.2 %
Neutro Abs: 3468 {cells}/uL (ref 1500–7800)
Neutrophils Relative %: 57.8 %
Platelets: 261 10*3/uL (ref 140–400)
RBC: 4.55 10*6/uL (ref 3.80–5.10)
RDW: 12.9 % (ref 11.0–15.0)
Total Lymphocyte: 32 %
WBC: 6 10*3/uL (ref 3.8–10.8)

## 2023-04-19 LAB — SEDIMENTATION RATE: Sed Rate: 11 mm/h (ref 0–30)

## 2023-04-19 LAB — C-REACTIVE PROTEIN: CRP: <3 mg/L (ref <8.0)

## 2023-04-20 ENCOUNTER — Other Ambulatory Visit: Payer: Self-pay

## 2023-04-20 ENCOUNTER — Ambulatory Visit
Admission: EM | Admit: 2023-04-20 | Discharge: 2023-04-20 | Disposition: A | Discharge status: Home / Self Care | Payer: Commercial Managed Care - PPO | Source: Home / Self Care

## 2023-04-20 ENCOUNTER — Encounter: Payer: Self-pay | Admitting: Emergency Medicine

## 2023-04-20 DIAGNOSIS — M25552 Pain in left hip: Secondary | ICD-10-CM

## 2023-04-20 DIAGNOSIS — E118 Type 2 diabetes mellitus with unspecified complications: Secondary | ICD-10-CM | POA: Diagnosis not present

## 2023-04-20 MED ORDER — PREDNISONE 20 MG PO TABS
20.0000 mg | ORAL_TABLET | Freq: Every day | ORAL | 0 refills | Status: AC
Start: 2023-04-20 — End: 2023-04-25

## 2023-04-20 NOTE — ED Provider Notes (Signed)
[name]    CSN: [id number] Arrival date & time: 04/20/23  1257      History   Chief Complaint Chief Complaint  Patient presents with   Hip Pain    HPI Donna [name] is a 57 y.o. female.   Here today for evaluation of left hip pain has been more constant over the last week.  She states that pain is present to the posterior left low back and lateral hip.  She reports she has had some off and on pain in the same area since she started wearing a boot to her right foot after recent toe amputation.  She notes that certain positions will help alleviate pain somewhat.  She denies any new numbness or tingling but does have known sciatica as well as neuropathy.  She has been taking anti-inflammatory medication with mild relief.  She denies any known injury but does admit to a fall about a week ago however notes she fell in mud and does not recall hip injury specifically.  The history is provided by the patient.    Past Medical History:  Diagnosis Date   Diabetes mellitus    type 2- uncontrolled   Hyperlipidemia    Hypertension    Lumbar disc disease    Obesity     Patient Active Problem List   Diagnosis Date Noted   Hypokalemia 03/27/2023   Osteomyelitis of toe of right foot (HCC) 03/24/2023   Medication monitoring encounter 06/13/2022   Gangrene of right foot (HCC) 06/12/2022   Cellulitis of right foot 06/03/2022   Osteomyelitis of fifth toe of right foot (HCC) 06/02/2022   Toenail deformity 09/01/2021   Mouth ulcer 02/28/2021   Right leg pain 06/15/2020   Pre-ulcerative calluses 06/09/2020   Posterior calcaneal exostosis 07/13/2019   Diabetic peripheral neuropathy (HCC) 12/01/2014   Routine general medical examination at a health care facility 07/18/2013   Restless leg syndrome 06/02/2012   Diabetes mellitus type 2 with complications (HCC) 08/22/2010   Hyperlipidemia associated with type 2 diabetes mellitus (HCC) 08/22/2010   Morbid obesity (HCC)  08/22/2010   Depression 08/22/2010   Essential hypertension 08/22/2010   GERD (gastroesophageal reflux disease) 08/22/2010    Past Surgical History:  Procedure Laterality Date   AMPUTATION Right 03/26/2023   Procedure: RIGHT 4TH TOE AMPUTATION;  Surgeon: Netta Cedars, MD;  Location: [facility];  Service: Orthopedics;  Laterality: Right;   AMPUTATION TOE Right 06/02/2022   Procedure: RIGHT FIFTH RAY AMPUTATION;  Surgeon: Netta Cedars, MD;  Location: [facility];  Service: Orthopedics;  Laterality: Right;   CHOLECYSTECTOMY  '96   Lap   ESI     lumbar spine, spinal injections   EXCISION HAGLUND'S DEFORMITY WITH ACHILLES TENDON REPAIR Right 07/23/2019   Procedure: Right Achilles Tendon Reconstruction, Excision of Haglund Deformity;  Surgeon: Toni Arthurs, MD;  Location: [facility];  Service: Orthopedics;  Laterality: Right;   GASTROC RECESSION EXTREMITY Right 07/23/2019   Procedure: Gastroc Recession;  Surgeon: Toni Arthurs, MD;  Location: [facility];  Service: Orthopedics;  Laterality: Right;   REDUCTION MAMMAPLASTY Bilateral    reduction mammoplasty bilaterally      OB History   No obstetric history on file.      Home Medications    Prior to Admission medications   Medication Sig Start Date End Date Taking? Authorizing Provider  predniSONE (DELTASONE) 20 MG tablet Take 1 tablet (20 mg total) by mouth daily with breakfast for 5 days. 04/20/23 04/25/23  Yes Tomi Bamberger, PA-C  acetaminophen (TYLENOL) 500 MG tablet Take 1,000 mg by mouth every 6 (six) hours as needed for mild pain or headache.    [provider]  atorvastatin (LIPITOR) 80 MG tablet Take 1 tablet (80 mg total) by mouth daily. 07/26/22   Myrlene Broker, MD  cefadroxil (DURICEF) 500 MG capsule Take 2 capsules by mouth 2 times daily for 28 days. 04/10/23 05/08/23  Danelle Earthly, MD  gabapentin (NEURONTIN) 300 MG capsule Take 1 capsule (300 mg total) by mouth 4 (four) times  daily. 01/11/23   Myrlene Broker, MD  insulin degludec (TRESIBA FLEXTOUCH) 200 UNIT/ML FlexTouch Pen Inject 110 Units into the skin daily. Follow-up is appt must see provider for future refills 04/04/23   Myrlene Broker, MD  JARDIANCE 25 MG TABS tablet Take 1 tablet (25 mg total) by mouth daily. 10/02/22   Myrlene Broker, MD  lisinopril-hydrochlorothiazide (ZESTORETIC) 20-25 MG tablet TAKE 1 TABLET BY MOUTH ONCE A DAY 08/28/22 08/28/23  Myrlene Broker, MD  meloxicam (MOBIC) 7.5 MG tablet Take 1 tablet (7.5 mg total) by mouth daily as needed. 04/04/23     metFORMIN (GLUCOPHAGE) 1000 MG tablet TAKE 1 TABLET BY MOUTH TWICE DAILY Patient taking differently: Take 1,000 mg by mouth 2 (two) times daily. 04/19/22 05/21/23  Myrlene Broker, MD  sertraline (ZOLOFT) 100 MG tablet TAKE 1 TABLET BY MOUTH ONCE DAILY Patient taking differently: Take 100 mg by mouth daily. 05/24/22 05/28/23  Myrlene Broker, MD  tobramycin-dexamethasone [facility]) ophthalmic solution Place 1 drop in affected eye(s) every 2 hours for 2 days, then 4 times daily for 5 days. Shake well. 09/19/22     trimethoprim-polymyxin b (POLYTRIM) ophthalmic solution Place 1 drop into both eyes every 4 (four) hours. 09/12/22   Daphine Deutscher, Mary-Margaret, FNP  Insulin Glargine (BASAGLAR KWIKPEN) 100 UNIT/ML Inject 0.5 mLs (50 Units total) into the skin 2 (two) times daily. 11/30/19 10/25/20  Myrlene Broker, MD    Family History Family History  Problem Relation Age of Onset   Other Father        CHF   Coronary artery disease Father    Cancer Father        renal cell carcinoma   Hypertension Father    Diabetes Father    Diabetes Mother    Coronary artery disease Mother    Heart attack Mother    Cancer Paternal Aunt        Breast   Breast cancer Paternal Aunt 8   Atrial fibrillation Other    Peripheral vascular disease Other    Heart disease Other     Social History Social History   Tobacco Use   Smoking  status: Never   Smokeless tobacco: Never  Substance Use Topics   Alcohol use: No   Drug use: No     Allergies   Patient has no known allergies.   Review of Systems Review of Systems  Constitutional:  Negative for chills and fever.  Eyes:  Negative for discharge and redness.  Gastrointestinal:  Negative for abdominal pain, nausea and vomiting.  Musculoskeletal:  Positive for arthralgias.     Physical Exam Triage Vital Signs ED Triage Vitals  Encounter Vitals Group     BP      Systolic BP Percentile      Diastolic BP Percentile      Pulse      Resp      Temp  Temp src      SpO2      Weight      Height      Head Circumference      Peak Flow      Pain Score      Pain Loc      Pain Education      Exclude from Growth Chart    No data found.  Updated Vital Signs BP 137/80 (BP Location: Left Arm)   Pulse 89   Temp 98.5 F (36.9 C) (Oral)   Resp 18   LMP 09/27/2011   SpO2 95%     Physical Exam Vitals and nursing note reviewed.  Constitutional:      General: She is not in acute distress.    Appearance: Normal appearance. She is not ill-appearing.  HENT:     Head: Normocephalic and atraumatic.  Eyes:     Conjunctiva/sclera: Conjunctivae normal.  Cardiovascular:     Rate and Rhythm: Normal rate.  Pulmonary:     Effort: Pulmonary effort is normal. No respiratory distress.  Musculoskeletal:     Comments: No tenderness to palpation of midline spine diffusely, mild tenderness to palpation to left low back and lateral left hip, patient able to bear weight without difficulty  Neurological:     Mental Status: She is alert.  Psychiatric:        Mood and Affect: Mood normal.        Behavior: Behavior normal.        Thought Content: Thought content normal.      UC Treatments / Results  Labs (all labs ordered are listed, but only abnormal results are displayed) Labs Reviewed - No data to display  EKG   Radiology No results  found.  Procedures Procedures (including critical care time)  Medications Ordered in UC Medications - No data to display  Initial Impression / Assessment and Plan / UC Course  I have reviewed the triage vital signs and the nursing notes.  Pertinent labs & imaging results that were available during my care of the patient were reviewed by me and considered in my medical decision making (see chart for details).    Will treat with very low-dose steroid burst to hopefully improve inflammation and pain.  Suspect likely sciatica versus bursitis.  Discussed that steroid may increase glucose levels and advised patient to monitor closely.  Recommended follow-up with Ortho if no gradual improvement with treatment or with any further concerns.  Final Clinical Impressions(s) / UC Diagnoses   Final diagnoses:  Left hip pain  Diabetes mellitus type 2 with complications [facility])   Discharge Instructions   None    ED Prescriptions     Medication Sig Dispense Auth. Provider   predniSONE (DELTASONE) 20 MG tablet Take 1 tablet (20 mg total) by mouth daily with breakfast for 5 days. 5 tablet Tomi Bamberger, PA-C      PDMP not reviewed this encounter.   Tomi Bamberger, PA-C 04/20/23 1450

## 2023-04-20 NOTE — ED Triage Notes (Signed)
Pt here for left hip pain x 1 week; pt did have a fall last week; pt had recent toe amputation on right side and was walking in boot

## 2023-04-23 DIAGNOSIS — M9903 Segmental and somatic dysfunction of lumbar region: Secondary | ICD-10-CM | POA: Diagnosis not present

## 2023-04-23 DIAGNOSIS — M5137 Other intervertebral disc degeneration, lumbosacral region: Secondary | ICD-10-CM | POA: Diagnosis not present

## 2023-04-23 DIAGNOSIS — M9901 Segmental and somatic dysfunction of cervical region: Secondary | ICD-10-CM | POA: Diagnosis not present

## 2023-04-23 DIAGNOSIS — M50322 Other cervical disc degeneration at C5-C6 level: Secondary | ICD-10-CM | POA: Diagnosis not present

## 2023-04-25 ENCOUNTER — Other Ambulatory Visit: Payer: Self-pay | Admitting: Internal Medicine

## 2023-04-25 DIAGNOSIS — M9901 Segmental and somatic dysfunction of cervical region: Secondary | ICD-10-CM | POA: Diagnosis not present

## 2023-04-25 DIAGNOSIS — M5137 Other intervertebral disc degeneration, lumbosacral region: Secondary | ICD-10-CM | POA: Diagnosis not present

## 2023-04-25 DIAGNOSIS — Z1231 Encounter for screening mammogram for malignant neoplasm of breast: Secondary | ICD-10-CM

## 2023-04-25 DIAGNOSIS — M9903 Segmental and somatic dysfunction of lumbar region: Secondary | ICD-10-CM | POA: Diagnosis not present

## 2023-04-25 DIAGNOSIS — M50322 Other cervical disc degeneration at C5-C6 level: Secondary | ICD-10-CM | POA: Diagnosis not present

## 2023-04-26 ENCOUNTER — Encounter: Payer: Self-pay | Admitting: Internal Medicine

## 2023-04-26 ENCOUNTER — Other Ambulatory Visit (HOSPITAL_COMMUNITY): Payer: Self-pay

## 2023-04-26 ENCOUNTER — Ambulatory Visit: Payer: Commercial Managed Care - PPO | Admitting: Internal Medicine

## 2023-04-26 ENCOUNTER — Ambulatory Visit (INDEPENDENT_AMBULATORY_CARE_PROVIDER_SITE_OTHER): Payer: Commercial Managed Care - PPO

## 2023-04-26 VITALS — BP 126/82 | HR 70 | Temp 98.0°F | Ht 66.0 in | Wt 229.0 lb

## 2023-04-26 DIAGNOSIS — M5442 Lumbago with sciatica, left side: Secondary | ICD-10-CM | POA: Insufficient documentation

## 2023-04-26 DIAGNOSIS — M160 Bilateral primary osteoarthritis of hip: Secondary | ICD-10-CM | POA: Diagnosis not present

## 2023-04-26 DIAGNOSIS — M47816 Spondylosis without myelopathy or radiculopathy, lumbar region: Secondary | ICD-10-CM | POA: Diagnosis not present

## 2023-04-26 DIAGNOSIS — M545 Low back pain, unspecified: Secondary | ICD-10-CM | POA: Diagnosis not present

## 2023-04-26 DIAGNOSIS — M25552 Pain in left hip: Secondary | ICD-10-CM | POA: Diagnosis not present

## 2023-04-26 DIAGNOSIS — M5137 Other intervertebral disc degeneration, lumbosacral region: Secondary | ICD-10-CM | POA: Diagnosis not present

## 2023-04-26 DIAGNOSIS — G8929 Other chronic pain: Secondary | ICD-10-CM | POA: Diagnosis not present

## 2023-04-26 DIAGNOSIS — M25551 Pain in right hip: Secondary | ICD-10-CM | POA: Diagnosis not present

## 2023-04-26 MED ORDER — CYCLOBENZAPRINE HCL 5 MG PO TABS
5.0000 mg | ORAL_TABLET | Freq: Three times a day (TID) | ORAL | 1 refills | Status: DC | PRN
  Filled 2023-04-26: qty 60, 20d supply, fill #0
  Filled 2023-06-20: qty 60, 20d supply, fill #1

## 2023-04-26 NOTE — Assessment & Plan Note (Signed)
Checking x-ray lumbar and bilateral hips. Given recent infection and diabetes needs imaging. Rx flexeril to help with pain and can continue to use otc medications as well.

## 2023-04-26 NOTE — Patient Instructions (Signed)
We have sent in flexeril which is a muscle relaxer to use for pain and we will do the x-rays.

## 2023-04-26 NOTE — Progress Notes (Signed)
   Subjective:   Patient ID: Donna [name], female    DOB: Dec 29, 1965, 57 y.o.   MRN: [id number]  HPI The patient is a 57 YO female coming in for new lower back and into left leg pain. Started 2-3 weeks ago and horrible. Limiting activity and tried prednisone pack did not help. Tried chiropractor and no help yet. Not worsening but not improving.  Review of Systems  Constitutional: Negative.   HENT: Negative.    Eyes: Negative.   Respiratory:  Negative for cough, chest tightness and shortness of breath.   Cardiovascular:  Negative for chest pain, palpitations and leg swelling.  Gastrointestinal:  Negative for abdominal distention, abdominal pain, constipation, diarrhea, nausea and vomiting.  Musculoskeletal:  Positive for arthralgias, back pain and myalgias.  Skin: Negative.   Neurological: Negative.   Psychiatric/Behavioral: Negative.      Objective:  Physical Exam Constitutional:      Appearance: She is well-developed.  HENT:     Head: Normocephalic and atraumatic.  Cardiovascular:     Rate and Rhythm: Normal rate and regular rhythm.  Pulmonary:     Effort: Pulmonary effort is normal. No respiratory distress.     Breath sounds: Normal breath sounds. No wheezing or rales.  Abdominal:     General: Bowel sounds are normal. There is no distension.     Palpations: Abdomen is soft.     Tenderness: There is no abdominal tenderness. There is no rebound.  Musculoskeletal:        General: Tenderness present.     Cervical back: Normal range of motion.  Skin:    General: Skin is warm and dry.  Neurological:     Mental Status: She is alert and oriented to person, place, and time.     Coordination: Coordination normal.     Vitals:   04/26/23 0951  BP: 126/82  Pulse: 70  Temp: 98 F (36.7 C)  TempSrc: Oral  SpO2: 97%  Weight: 229 lb (103.9 kg)  Height: 5\' 6"  (1.676 m)    Assessment & Plan:

## 2023-04-29 DIAGNOSIS — M9901 Segmental and somatic dysfunction of cervical region: Secondary | ICD-10-CM | POA: Diagnosis not present

## 2023-04-29 DIAGNOSIS — M9903 Segmental and somatic dysfunction of lumbar region: Secondary | ICD-10-CM | POA: Diagnosis not present

## 2023-04-29 DIAGNOSIS — M50322 Other cervical disc degeneration at C5-C6 level: Secondary | ICD-10-CM | POA: Diagnosis not present

## 2023-04-29 DIAGNOSIS — M5137 Other intervertebral disc degeneration, lumbosacral region: Secondary | ICD-10-CM | POA: Diagnosis not present

## 2023-04-30 ENCOUNTER — Other Ambulatory Visit (HOSPITAL_COMMUNITY): Payer: Self-pay

## 2023-05-01 ENCOUNTER — Other Ambulatory Visit (HOSPITAL_COMMUNITY): Payer: Self-pay

## 2023-05-01 DIAGNOSIS — M50322 Other cervical disc degeneration at C5-C6 level: Secondary | ICD-10-CM | POA: Diagnosis not present

## 2023-05-01 DIAGNOSIS — M9901 Segmental and somatic dysfunction of cervical region: Secondary | ICD-10-CM | POA: Diagnosis not present

## 2023-05-01 DIAGNOSIS — M9903 Segmental and somatic dysfunction of lumbar region: Secondary | ICD-10-CM | POA: Diagnosis not present

## 2023-05-01 DIAGNOSIS — M5137 Other intervertebral disc degeneration, lumbosacral region: Secondary | ICD-10-CM | POA: Diagnosis not present

## 2023-05-02 ENCOUNTER — Other Ambulatory Visit (HOSPITAL_COMMUNITY): Payer: Self-pay

## 2023-05-06 ENCOUNTER — Ambulatory Visit
Admission: RE | Admit: 2023-05-06 | Discharge: 2023-05-06 | Disposition: A | Discharge status: Home / Self Care | Payer: Commercial Managed Care - PPO | Source: Ambulatory Visit

## 2023-05-06 DIAGNOSIS — Z1231 Encounter for screening mammogram for malignant neoplasm of breast: Secondary | ICD-10-CM

## 2023-05-07 LAB — HM MAMMOGRAPHY

## 2023-05-08 ENCOUNTER — Encounter: Payer: Self-pay | Admitting: Internal Medicine

## 2023-05-09 DIAGNOSIS — L02611 Cutaneous abscess of right foot: Secondary | ICD-10-CM | POA: Diagnosis not present

## 2023-05-10 ENCOUNTER — Other Ambulatory Visit (HOSPITAL_COMMUNITY): Payer: Self-pay

## 2023-05-10 ENCOUNTER — Other Ambulatory Visit: Payer: Self-pay | Admitting: Internal Medicine

## 2023-05-11 ENCOUNTER — Other Ambulatory Visit (HOSPITAL_COMMUNITY): Payer: Self-pay

## 2023-05-11 ENCOUNTER — Other Ambulatory Visit: Payer: Self-pay | Admitting: Internal Medicine

## 2023-05-13 ENCOUNTER — Other Ambulatory Visit (HOSPITAL_COMMUNITY): Payer: Self-pay

## 2023-05-13 DIAGNOSIS — M50322 Other cervical disc degeneration at C5-C6 level: Secondary | ICD-10-CM | POA: Diagnosis not present

## 2023-05-13 DIAGNOSIS — M9901 Segmental and somatic dysfunction of cervical region: Secondary | ICD-10-CM | POA: Diagnosis not present

## 2023-05-13 DIAGNOSIS — M9903 Segmental and somatic dysfunction of lumbar region: Secondary | ICD-10-CM | POA: Diagnosis not present

## 2023-05-13 DIAGNOSIS — M5137 Other intervertebral disc degeneration, lumbosacral region: Secondary | ICD-10-CM | POA: Diagnosis not present

## 2023-05-13 MED ORDER — TRESIBA FLEXTOUCH 200 UNIT/ML ~~LOC~~ SOPN
110.0000 [IU] | PEN_INJECTOR | Freq: Every day | SUBCUTANEOUS | 0 refills | Status: DC
  Filled 2023-05-13: qty 15, 27d supply, fill #0

## 2023-05-21 ENCOUNTER — Ambulatory Visit: Payer: Commercial Managed Care - PPO | Admitting: Internal Medicine

## 2023-05-24 ENCOUNTER — Other Ambulatory Visit: Payer: Self-pay | Admitting: Internal Medicine

## 2023-05-24 DIAGNOSIS — Z1212 Encounter for screening for malignant neoplasm of rectum: Secondary | ICD-10-CM

## 2023-05-24 DIAGNOSIS — Z1211 Encounter for screening for malignant neoplasm of colon: Secondary | ICD-10-CM

## 2023-05-30 ENCOUNTER — Other Ambulatory Visit (HOSPITAL_COMMUNITY): Payer: Self-pay

## 2023-05-30 ENCOUNTER — Other Ambulatory Visit: Payer: Self-pay | Admitting: Internal Medicine

## 2023-05-31 ENCOUNTER — Other Ambulatory Visit (HOSPITAL_COMMUNITY): Payer: Self-pay

## 2023-06-02 ENCOUNTER — Other Ambulatory Visit: Payer: Self-pay | Admitting: Internal Medicine

## 2023-06-03 ENCOUNTER — Other Ambulatory Visit (HOSPITAL_COMMUNITY): Payer: Self-pay

## 2023-06-03 MED ORDER — METFORMIN HCL 1000 MG PO TABS
ORAL_TABLET | Freq: Two times a day (BID) | ORAL | 3 refills | Status: DC
  Filled 2023-06-03: qty 180, 90d supply, fill #0
  Filled 2023-09-10: qty 180, 90d supply, fill #1
  Filled 2023-12-08: qty 180, 90d supply, fill #2
  Filled 2024-03-12: qty 180, 90d supply, fill #3

## 2023-06-03 MED ORDER — UNIFINE PENTIPS 31G X 5 MM MISC
3 refills | Status: AC
  Filled 2023-06-03: qty 100, 90d supply, fill #0

## 2023-06-03 MED ORDER — LISINOPRIL-HYDROCHLOROTHIAZIDE 20-25 MG PO TABS
1.0000 | ORAL_TABLET | Freq: Every day | ORAL | 2 refills | Status: DC
  Filled 2023-06-03: qty 90, 90d supply, fill #0
  Filled 2023-09-02: qty 90, 90d supply, fill #1
  Filled 2023-12-08: qty 90, 90d supply, fill #2

## 2023-06-03 MED ORDER — SERTRALINE HCL 100 MG PO TABS
ORAL_TABLET | Freq: Every day | ORAL | 3 refills | Status: DC
  Filled 2023-06-03: qty 90, 90d supply, fill #0
  Filled 2023-09-02: qty 90, 90d supply, fill #1
  Filled 2023-12-08: qty 90, 90d supply, fill #2
  Filled 2024-03-03: qty 90, 90d supply, fill #3

## 2023-06-10 ENCOUNTER — Ambulatory Visit (INDEPENDENT_AMBULATORY_CARE_PROVIDER_SITE_OTHER): Payer: Commercial Managed Care - PPO | Admitting: Podiatry

## 2023-06-10 DIAGNOSIS — M2041 Other hammer toe(s) (acquired), right foot: Secondary | ICD-10-CM | POA: Diagnosis not present

## 2023-06-10 DIAGNOSIS — E1149 Type 2 diabetes mellitus with other diabetic neurological complication: Secondary | ICD-10-CM

## 2023-06-10 DIAGNOSIS — L608 Other nail disorders: Secondary | ICD-10-CM | POA: Diagnosis not present

## 2023-06-10 DIAGNOSIS — L84 Corns and callosities: Secondary | ICD-10-CM | POA: Diagnosis not present

## 2023-06-10 DIAGNOSIS — L603 Nail dystrophy: Secondary | ICD-10-CM | POA: Diagnosis not present

## 2023-06-10 NOTE — Progress Notes (Unsigned)
Subjective: No chief complaint on file.    57 year old female presents today for diabetic foot exam.  She is a saw her last she did end up having another toe amputated.  She says she does not have a wound to her right fourth toe turned blue and was told to osteomyelitis and she ended up with amputation.  She states on the right second and third toes to get red during the day from rubbing but the redness goes away at night when she takes her shoes off.  She does not have any open wounds or any drainage or pus.  Denies any fevers or chills.   Objective: AAO x3, NAD DP/PT pulses palpable bilaterally, CRT less than 3 seconds Preulcerative callus to the right hallux.  No underlying ulceration, drainage or signs of infection.  Feet.  She has hammertoe contractures present to right second third toe edema likely more from inflammation of the distal portion of her toes with no increased temperature, open lesions or any drainage or pus or any other signs of infection.  The nails on the right foot are hypertrophic, dystrophic. No pain with calf compression, swelling, warmth, erythema  Assessment: Preulcerative callus right hallux; nail dystrophy  Plan: -All treatment options discussed with the patient including all alternatives, risks, complications.  -Debrided hyperkeratotic tissue and complications of bleeding.  As a courtesy but recommend moisturizer, offloading. -For the hammertoes we discussed different treatment options I dispensed various offloading pads.  She is monitor very closely for this.  Although she has a minimal erythema distally this is from irritation, rubbing but she states the redness will be go away at night after she takes her shoes off.  We discussed flexor tenotomy to help take pressure off her toes but she was not on this for now.  She is to monitor this very closely for any signs or symptoms of infection, open lesions and report emergency room is no immediately should any  occur. -I did your nails are thickened may be from fungus but also more from damage.  I debrided the nails and I sent them for culture. -Daily foot inspection of the pus control.  Vivi Barrack DPM

## 2023-06-20 ENCOUNTER — Encounter: Payer: Self-pay | Admitting: Podiatry

## 2023-06-20 ENCOUNTER — Other Ambulatory Visit: Payer: Self-pay | Admitting: Internal Medicine

## 2023-06-20 ENCOUNTER — Other Ambulatory Visit (HOSPITAL_COMMUNITY): Payer: Self-pay

## 2023-06-20 DIAGNOSIS — Z4889 Encounter for other specified surgical aftercare: Secondary | ICD-10-CM | POA: Diagnosis not present

## 2023-06-20 DIAGNOSIS — L02611 Cutaneous abscess of right foot: Secondary | ICD-10-CM | POA: Diagnosis not present

## 2023-06-21 ENCOUNTER — Other Ambulatory Visit (HOSPITAL_COMMUNITY): Payer: Self-pay

## 2023-06-21 MED ORDER — TRESIBA FLEXTOUCH 200 UNIT/ML ~~LOC~~ SOPN
110.0000 [IU] | PEN_INJECTOR | Freq: Every day | SUBCUTANEOUS | 0 refills | Status: DC
  Filled 2023-06-21: qty 15, 27d supply, fill #0

## 2023-07-01 ENCOUNTER — Other Ambulatory Visit: Payer: Self-pay

## 2023-07-01 ENCOUNTER — Ambulatory Visit (INDEPENDENT_AMBULATORY_CARE_PROVIDER_SITE_OTHER): Payer: Commercial Managed Care - PPO | Admitting: Internal Medicine

## 2023-07-01 VITALS — BP 129/80 | HR 84 | Temp 98.0°F | Ht 66.0 in | Wt 227.0 lb

## 2023-07-01 DIAGNOSIS — M869 Osteomyelitis, unspecified: Secondary | ICD-10-CM | POA: Diagnosis not present

## 2023-07-01 NOTE — Progress Notes (Signed)
Patient: Donna [name]  DOB: March 03, 1966 MRN: [id number] PCP: Myrlene Broker, MD    Chief Complaint  Patient presents with   Follow-up     Patient Active Problem List   Diagnosis Date Noted   Acute bilateral low back pain with left-sided sciatica 04/26/2023   Medication monitoring encounter 06/13/2022   Toenail deformity 09/01/2021   Mouth ulcer 02/28/2021   Right leg pain 06/15/2020   Posterior calcaneal exostosis 07/13/2019   Diabetic peripheral neuropathy (HCC) 12/01/2014   Routine general medical examination at a health care facility 07/18/2013   Restless leg syndrome 06/02/2012   Diabetes mellitus type 2 with complications (HCC) 08/22/2010   Hyperlipidemia associated with type 2 diabetes mellitus (HCC) 08/22/2010   Morbid obesity (HCC) 08/22/2010   Depression 08/22/2010   Essential hypertension 08/22/2010   GERD (gastroesophageal reflux disease) 08/22/2010     Subjective:  Donna [name] is a 57 y.o. with PMHX as below including DM, Hx of right 5th to4 osteo SP amputation Cx+ MSSA presents for hospital f/u of right 4th metatarsal osteomyelitis status post fourth toe amputation through proximal phalanx on 8/6. Back n 10/23 patient had right lateral foot gangrene with fifth toe osteomyelitis, underwent right fifth ray amputation on 1014/23 with Dr. Odis Hollingshead.  Followed by infectious disease, received 2 weeks of p.o. antibiotics for, pathology noted good margins.  Cultures at that time grew MSSA, treated with linezolid. Wound healed well following amputation back in October 2023. She went to [facility] and was in the ocean and pool .felt well but fatigued after return.  She noted that on Friday she had some right groin pain and noticed erythema around the right foot, fourth toe erythema now has progressed.  Tip of toe appears gangrenous.  Ortho engaged, patient underwent right fourth toe amputation proximal phalanx.  Pathology and culture sent.  Cx+ MSSA, Path  showed benign bone with acute on chronic inflammation. Given this is her second episode of steo with MSSA(different toe) would treat x 6 weeks.  linezolid x 2 weeks(EOT 8/19)->cefadroxil 1gm bid x 4 weeks to complete 6 weeks of antibiotics EOT  9/16 8/29: tolerating abx. No new complaints   Today 07/01/23: Doing well. Seen by podiatry, completed antibiotics. No fevers or chills.   Review of Systems  All other systems reviewed and are negative.   Past Medical History:  Diagnosis Date   Diabetes mellitus    type 2- uncontrolled   Hyperlipidemia    Hypertension    Lumbar disc disease    Obesity     Outpatient Medications Prior to Visit  Medication Sig Dispense Refill   acetaminophen (TYLENOL) 500 MG tablet Take 1,000 mg by mouth every 6 (six) hours as needed for mild pain or headache.     atorvastatin (LIPITOR) 80 MG tablet Take 1 tablet (80 mg total) by mouth daily. 90 tablet 2   cyclobenzaprine (FLEXERIL) 5 MG tablet Take 1 tablet (5 mg total) by mouth 3 (three) times daily as needed for muscle spasms. 60 tablet 1   gabapentin (NEURONTIN) 300 MG capsule Take 1 capsule (300 mg total) by mouth 4 (four) times daily. 360 capsule 1   insulin degludec (TRESIBA FLEXTOUCH) 200 UNIT/ML FlexTouch Pen Inject 110 Units into the skin daily. Follow-up appt must see provider for future refills 15 mL 0   Insulin Pen Needle (UNIFINE PENTIPS) 31G X 5 MM MISC Use as directed once daily with tresiba 100 each 3   JARDIANCE 25 MG  TABS tablet Take 1 tablet (25 mg total) by mouth daily. 90 tablet 2   lisinopril-hydrochlorothiazide (ZESTORETIC) 20-25 MG tablet TAKE 1 TABLET BY MOUTH ONCE A DAY 90 tablet 2   meloxicam (MOBIC) 7.5 MG tablet Take 1 tablet (7.5 mg total) by mouth daily as needed. 30 tablet 0   metFORMIN (GLUCOPHAGE) 1000 MG tablet TAKE 1 TABLET BY MOUTH TWICE DAILY 180 tablet 3   sertraline (ZOLOFT) 100 MG tablet TAKE 1 TABLET BY MOUTH ONCE DAILY 90 tablet 3   No facility-administered medications  prior to visit.     No Known Allergies  Social History   Tobacco Use   Smoking status: Never   Smokeless tobacco: Never  Substance Use Topics   Alcohol use: No   Drug use: No    Family History  Problem Relation Age of Onset   Other Father        CHF   Coronary artery disease Father    Cancer Father        renal cell carcinoma   Hypertension Father    Diabetes Father    Diabetes Mother    Coronary artery disease Mother    Heart attack Mother    Cancer Paternal Aunt        Breast   Breast cancer Paternal Aunt 48   Atrial fibrillation Other    Peripheral vascular disease Other    Heart disease Other     Objective:  There were no vitals filed for this visit. There is no height or weight on file to calculate BMI.  Physical Exam Constitutional:      Appearance: Normal appearance.  HENT:     Head: Normocephalic and atraumatic.     Right Ear: Tympanic membrane normal.     Left Ear: Tympanic membrane normal.     Nose: Nose normal.     Mouth/Throat:     Mouth: Mucous membranes are moist.  Eyes:     Extraocular Movements: Extraocular movements intact.     Conjunctiva/sclera: Conjunctivae normal.     Pupils: Pupils are equal, round, and reactive to light.  Cardiovascular:     Rate and Rhythm: Normal rate and regular rhythm.     Heart sounds: No murmur heard.    No friction rub. No gallop.  Pulmonary:     Effort: Pulmonary effort is normal.     Breath sounds: Normal breath sounds.  Abdominal:     General: Abdomen is flat.     Palpations: Abdomen is soft.  Skin:    General: Skin is warm and dry.  Neurological:     General: No focal deficit present.     Mental Status: She is alert and oriented to person, place, and time.  Psychiatric:        Mood and Affect: Mood normal.    Foot wound closed and healed.Callus on toe not concerning for acute infeciton. Lab Results: Lab Results  Component Value Date   WBC 6.0 04/18/2023   HGB 14.0 04/18/2023   HCT 41.0  04/18/2023   MCV 90.1 04/18/2023   PLT 261 04/18/2023    Lab Results  Component Value Date   CREATININE 0.87 04/18/2023   BUN 21 04/18/2023   NA 137 04/18/2023   K 3.7 04/18/2023   CL 100 04/18/2023   CO2 25 04/18/2023    Lab Results  Component Value Date   ALT 20 04/18/2023   AST 13 04/18/2023   ALKPHOS 73 03/24/2023   BILITOT 0.6 04/18/2023  Assessment & Plan:  #Right 4th metatarsal osteomyelitis status post fourth toe amputation through proximal phalanx on 8/6 #Hx of right 5th toe osteo SP amputation Cx+ MSSA -Discharged on linezolid x 2 weeks(EOT 8/19)->cefadroxil 1gm bid x 4 weeks to complete 6 weeks of antibiotics EOT 9/16  -Doing well on cefadroxil. No concerns for active infection today. -Seen by podiatry for preulcerating lesion on 5th toe. No concern for active infection today(callused over).  Plan: -Labs today -Completed abx with cefadroxil EOT 05/06/23 -F/U with ID PRN  #Diabetes mellitus-poorly controlled - A1c 8.6 on 8/5 -Needs glycemic control for optimal wound healing  Danelle Earthly, MD [facility]   07/01/23  4:17 PM  I have personally spent 32 minutes involved in face-to-face and non-face-to-face activities for this patient on the day of the visit. Professional time spent includes the following activities: Preparing to see the patient (review of tests), Obtaining and/or reviewing separately obtained history (admission/discharge record), Performing a medically appropriate examination and/or evaluation , Ordering medications/tests/procedures, referring and communicating with other health care professionals, Documenting clinical information in the EMR, Independently interpreting results (not separately reported), Communicating results to the patient/family/caregiver, Counseling and educating the patient/family/caregiver and Care coordination (not separately reported).

## 2023-07-02 ENCOUNTER — Ambulatory Visit: Payer: Commercial Managed Care - PPO | Admitting: Internal Medicine

## 2023-07-02 LAB — CBC WITH DIFFERENTIAL/PLATELET
Absolute Lymphocytes: 2496 {cells}/uL (ref 850–3900)
Absolute Monocytes: 429 {cells}/uL (ref 200–950)
Basophils Absolute: 39 {cells}/uL (ref 0–200)
Basophils Relative: 0.5 %
Eosinophils Absolute: 117 {cells}/uL (ref 15–500)
Eosinophils Relative: 1.5 %
HCT: 43.5 % (ref 35.0–45.0)
Hemoglobin: 14.8 g/dL (ref 11.7–15.5)
MCH: 31.4 pg (ref 27.0–33.0)
MCHC: 34 g/dL (ref 32.0–36.0)
MCV: 92.2 fL (ref 80.0–100.0)
MPV: 9.8 fL (ref 7.5–12.5)
Monocytes Relative: 5.5 %
Neutro Abs: 4719 {cells}/uL (ref 1500–7800)
Neutrophils Relative %: 60.5 %
Platelets: 328 10*3/uL (ref 140–400)
RBC: 4.72 10*6/uL (ref 3.80–5.10)
RDW: 11.9 % (ref 11.0–15.0)
Total Lymphocyte: 32 %
WBC: 7.8 10*3/uL (ref 3.8–10.8)

## 2023-07-02 LAB — COMPLETE METABOLIC PANEL WITH GFR
AG Ratio: 1.6 (calc) (ref 1.0–2.5)
ALT: 20 U/L (ref 6–29)
AST: 14 U/L (ref 10–35)
Albumin: 4.4 g/dL (ref 3.6–5.1)
Alkaline phosphatase (APISO): 86 U/L (ref 37–153)
BUN: 17 mg/dL (ref 7–25)
CO2: 28 mmol/L (ref 20–32)
Calcium: 9.2 mg/dL (ref 8.6–10.4)
Chloride: 99 mmol/L (ref 98–110)
Creat: 0.57 mg/dL (ref 0.50–1.03)
Globulin: 2.8 g/dL (ref 1.9–3.7)
Glucose, Bld: 194 mg/dL — ABNORMAL HIGH (ref 65–99)
Potassium: 3.7 mmol/L (ref 3.5–5.3)
Sodium: 138 mmol/L (ref 135–146)
Total Bilirubin: 0.4 mg/dL (ref 0.2–1.2)
Total Protein: 7.2 g/dL (ref 6.1–8.1)
eGFR: 105 mL/min/{1.73_m2} (ref >=60)

## 2023-07-02 LAB — C-REACTIVE PROTEIN: CRP: <3 mg/L (ref <8.0)

## 2023-07-02 LAB — SEDIMENTATION RATE: Sed Rate: 19 mm/h (ref 0–30)

## 2023-07-04 ENCOUNTER — Encounter: Payer: Self-pay | Admitting: Internal Medicine

## 2023-07-04 ENCOUNTER — Ambulatory Visit: Payer: Commercial Managed Care - PPO | Admitting: Internal Medicine

## 2023-07-04 ENCOUNTER — Other Ambulatory Visit (HOSPITAL_COMMUNITY): Payer: Self-pay

## 2023-07-04 VITALS — BP 124/80 | HR 76 | Temp 98.1°F | Ht 66.0 in | Wt 228.0 lb

## 2023-07-04 DIAGNOSIS — E118 Type 2 diabetes mellitus with unspecified complications: Secondary | ICD-10-CM

## 2023-07-04 DIAGNOSIS — Z7984 Long term (current) use of oral hypoglycemic drugs: Secondary | ICD-10-CM

## 2023-07-04 DIAGNOSIS — M79641 Pain in right hand: Secondary | ICD-10-CM

## 2023-07-04 LAB — POCT GLYCOSYLATED HEMOGLOBIN (HGB A1C): HbA1c POC (<> result, manual entry): 8.7 % (ref 4.0–5.6)

## 2023-07-04 MED ORDER — GLIMEPIRIDE 1 MG PO TABS
1.0000 mg | ORAL_TABLET | Freq: Every day | ORAL | 1 refills | Status: DC
  Filled 2023-07-04: qty 90, 90d supply, fill #0
  Filled 2023-10-10: qty 90, 90d supply, fill #1

## 2023-07-04 MED ORDER — GLIMEPIRIDE 1 MG PO TABS
1.0000 mg | ORAL_TABLET | Freq: Every day | ORAL | 1 refills | Status: DC
  Filled 2023-07-04: qty 90, 90d supply, fill #0

## 2023-07-04 NOTE — Patient Instructions (Signed)
We have sent in the glimepiride to take 1 pill daily with first meal of the day.

## 2023-07-04 NOTE — Assessment & Plan Note (Signed)
POC Hga1c done and 8.7%. Goal is around 7% so above goal. Will add glimepiride 1 mg daily and continue metformin 1000 mg BID and jardiance 25 mg daily and tresiba 110 units daily. Is on ACE-I and statin. Follow up 3 months.

## 2023-07-04 NOTE — Progress Notes (Signed)
   Subjective:   Patient ID: Donna [name], female    DOB: Apr 28, 1966, 57 y.o.   MRN: [id number]  HPI The patient is a 57 YO female coming in for pain in the hand and just not feeling great.   Review of Systems  Constitutional:  Positive for fatigue.  HENT: Negative.    Eyes: Negative.   Respiratory:  Negative for cough, chest tightness and shortness of breath.   Cardiovascular:  Negative for chest pain, palpitations and leg swelling.  Gastrointestinal:  Negative for abdominal distention, abdominal pain, constipation, diarrhea, nausea and vomiting.  Musculoskeletal:  Positive for arthralgias.  Skin: Negative.   Neurological: Negative.   Psychiatric/Behavioral: Negative.      Objective:  Physical Exam Constitutional:      Appearance: She is well-developed.  HENT:     Head: Normocephalic and atraumatic.  Cardiovascular:     Rate and Rhythm: Normal rate and regular rhythm.  Pulmonary:     Effort: Pulmonary effort is normal. No respiratory distress.     Breath sounds: Normal breath sounds. No wheezing or rales.  Abdominal:     General: Bowel sounds are normal. There is no distension.     Palpations: Abdomen is soft.     Tenderness: There is no abdominal tenderness. There is no rebound.  Musculoskeletal:        General: Tenderness present.     Cervical back: Normal range of motion.     Comments: Right hand with scar tissue cord 4th finger with some limitation of grip and ROM.  Skin:    General: Skin is warm and dry.  Neurological:     Mental Status: She is alert and oriented to person, place, and time.     Coordination: Coordination normal.     Vitals:   07/04/23 0757  BP: 124/80  Pulse: 76  Temp: 98.1 F (36.7 C)  TempSrc: Oral  SpO2: 97%  Weight: 228 lb (103.4 kg)  Height: 5\' 6"  (1.676 m)    Assessment & Plan:

## 2023-07-08 ENCOUNTER — Other Ambulatory Visit (HOSPITAL_COMMUNITY): Payer: Self-pay

## 2023-07-08 ENCOUNTER — Other Ambulatory Visit: Payer: Self-pay | Admitting: Internal Medicine

## 2023-07-08 MED ORDER — JARDIANCE 25 MG PO TABS
25.0000 mg | ORAL_TABLET | Freq: Every day | ORAL | 2 refills | Status: DC
  Filled 2023-07-08: qty 90, 90d supply, fill #0
  Filled 2023-10-10: qty 90, 90d supply, fill #1
  Filled 2024-01-10: qty 90, 90d supply, fill #2

## 2023-07-08 MED ORDER — ATORVASTATIN CALCIUM 80 MG PO TABS
80.0000 mg | ORAL_TABLET | Freq: Every day | ORAL | 2 refills | Status: DC
  Filled 2023-07-08: qty 90, 90d supply, fill #0
  Filled 2023-11-13: qty 90, 90d supply, fill #1
  Filled 2024-02-18: qty 90, 90d supply, fill #2

## 2023-07-10 ENCOUNTER — Other Ambulatory Visit (HOSPITAL_COMMUNITY): Payer: Self-pay

## 2023-07-11 DIAGNOSIS — M65341 Trigger finger, right ring finger: Secondary | ICD-10-CM | POA: Diagnosis not present

## 2023-07-26 ENCOUNTER — Other Ambulatory Visit: Payer: Self-pay | Admitting: Internal Medicine

## 2023-07-26 ENCOUNTER — Other Ambulatory Visit (HOSPITAL_COMMUNITY): Payer: Self-pay

## 2023-07-29 ENCOUNTER — Other Ambulatory Visit: Payer: Self-pay | Admitting: Internal Medicine

## 2023-07-29 ENCOUNTER — Other Ambulatory Visit (HOSPITAL_COMMUNITY): Payer: Self-pay

## 2023-07-30 ENCOUNTER — Other Ambulatory Visit (HOSPITAL_COMMUNITY): Payer: Self-pay

## 2023-07-30 ENCOUNTER — Other Ambulatory Visit: Payer: Self-pay | Admitting: Internal Medicine

## 2023-08-01 ENCOUNTER — Other Ambulatory Visit: Payer: Self-pay | Admitting: Internal Medicine

## 2023-08-01 ENCOUNTER — Other Ambulatory Visit (HOSPITAL_COMMUNITY): Payer: Self-pay

## 2023-08-05 ENCOUNTER — Other Ambulatory Visit (HOSPITAL_COMMUNITY): Payer: Self-pay

## 2023-08-05 ENCOUNTER — Other Ambulatory Visit: Payer: Self-pay

## 2023-08-05 ENCOUNTER — Other Ambulatory Visit: Payer: Self-pay | Admitting: Internal Medicine

## 2023-08-05 ENCOUNTER — Telehealth: Payer: Self-pay | Admitting: Internal Medicine

## 2023-08-05 MED ORDER — TRESIBA FLEXTOUCH 200 UNIT/ML ~~LOC~~ SOPN
110.0000 [IU] | PEN_INJECTOR | Freq: Every day | SUBCUTANEOUS | 0 refills | Status: DC
  Filled 2023-08-05: qty 15, 27d supply, fill #0

## 2023-08-05 NOTE — Telephone Encounter (Signed)
Prescription Request  08/05/2023  LOV: 07/04/2023  What is the name of the medication or equipment? insulin degludec (TRESIBA FLEXTOUCH) 200 UNIT/ML FlexTouch Pen [[id number]]   Have you contacted your pharmacy to request a refill? Yes   Which pharmacy would you like this sent to?  [facility] [address], [place] [address] Phone: [phone]  Fax: [phone]  Patient notified that their request is being sent to the clinical staff for review and that they should receive a response within 2 business days.   Please advise at Mobile [phone] (mobile)

## 2023-08-07 ENCOUNTER — Other Ambulatory Visit (HOSPITAL_COMMUNITY): Payer: Self-pay

## 2023-08-08 ENCOUNTER — Other Ambulatory Visit: Payer: Self-pay | Admitting: Internal Medicine

## 2023-08-08 ENCOUNTER — Other Ambulatory Visit (HOSPITAL_COMMUNITY): Payer: Self-pay

## 2023-08-09 ENCOUNTER — Other Ambulatory Visit (HOSPITAL_COMMUNITY): Payer: Self-pay

## 2023-08-09 ENCOUNTER — Other Ambulatory Visit: Payer: Self-pay

## 2023-08-09 ENCOUNTER — Telehealth: Payer: Self-pay

## 2023-08-09 MED ORDER — GABAPENTIN 300 MG PO CAPS
300.0000 mg | ORAL_CAPSULE | Freq: Four times a day (QID) | ORAL | 1 refills | Status: DC
  Filled 2023-08-09: qty 360, 90d supply, fill #0
  Filled 2023-11-13: qty 360, 90d supply, fill #1

## 2023-08-09 NOTE — Telephone Encounter (Signed)
Communication  Reason for CRM: Patient called wanting to know what the delay was for her prescription gabapentin (NEURONTIN) 300 MG capsule / please call [phone]

## 2023-08-09 NOTE — Telephone Encounter (Signed)
Refill sent in

## 2023-08-12 ENCOUNTER — Other Ambulatory Visit (HOSPITAL_COMMUNITY): Payer: Self-pay

## 2023-08-29 ENCOUNTER — Other Ambulatory Visit (HOSPITAL_COMMUNITY): Payer: Self-pay

## 2023-08-30 ENCOUNTER — Other Ambulatory Visit (HOSPITAL_COMMUNITY): Payer: Self-pay

## 2023-09-02 ENCOUNTER — Other Ambulatory Visit (HOSPITAL_COMMUNITY): Payer: Self-pay

## 2023-09-02 ENCOUNTER — Other Ambulatory Visit: Payer: Self-pay | Admitting: Internal Medicine

## 2023-09-03 ENCOUNTER — Other Ambulatory Visit (HOSPITAL_COMMUNITY): Payer: Self-pay

## 2023-09-03 MED ORDER — TRESIBA FLEXTOUCH 200 UNIT/ML ~~LOC~~ SOPN
110.0000 [IU] | PEN_INJECTOR | Freq: Every day | SUBCUTANEOUS | 0 refills | Status: DC
  Filled 2023-09-03: qty 15, 27d supply, fill #0

## 2023-09-13 ENCOUNTER — Other Ambulatory Visit (HOSPITAL_COMMUNITY): Payer: Self-pay

## 2023-10-01 DIAGNOSIS — L02611 Cutaneous abscess of right foot: Secondary | ICD-10-CM | POA: Diagnosis not present

## 2023-10-10 ENCOUNTER — Other Ambulatory Visit: Payer: Self-pay | Admitting: Internal Medicine

## 2023-10-10 ENCOUNTER — Other Ambulatory Visit (HOSPITAL_COMMUNITY): Payer: Self-pay

## 2023-10-11 ENCOUNTER — Other Ambulatory Visit (HOSPITAL_COMMUNITY): Payer: Self-pay

## 2023-10-11 MED ORDER — TRESIBA FLEXTOUCH 200 UNIT/ML ~~LOC~~ SOPN
110.0000 [IU] | PEN_INJECTOR | Freq: Every day | SUBCUTANEOUS | 0 refills | Status: DC
  Filled 2023-10-11: qty 15, 27d supply, fill #0

## 2023-10-21 DIAGNOSIS — Z89619 Acquired absence of unspecified leg above knee: Secondary | ICD-10-CM | POA: Diagnosis not present

## 2023-10-21 DIAGNOSIS — E1142 Type 2 diabetes mellitus with diabetic polyneuropathy: Secondary | ICD-10-CM | POA: Diagnosis not present

## 2023-10-21 DIAGNOSIS — M2042 Other hammer toe(s) (acquired), left foot: Secondary | ICD-10-CM | POA: Diagnosis not present

## 2023-10-21 DIAGNOSIS — I70203 Unspecified atherosclerosis of native arteries of extremities, bilateral legs: Secondary | ICD-10-CM | POA: Diagnosis not present

## 2023-10-21 DIAGNOSIS — B351 Tinea unguium: Secondary | ICD-10-CM | POA: Diagnosis not present

## 2023-10-21 DIAGNOSIS — M2041 Other hammer toe(s) (acquired), right foot: Secondary | ICD-10-CM | POA: Diagnosis not present

## 2023-11-14 ENCOUNTER — Other Ambulatory Visit (HOSPITAL_COMMUNITY): Payer: Self-pay

## 2023-11-18 DIAGNOSIS — I70203 Unspecified atherosclerosis of native arteries of extremities, bilateral legs: Secondary | ICD-10-CM | POA: Diagnosis not present

## 2023-11-18 DIAGNOSIS — M2041 Other hammer toe(s) (acquired), right foot: Secondary | ICD-10-CM | POA: Diagnosis not present

## 2023-11-18 DIAGNOSIS — Z89619 Acquired absence of unspecified leg above knee: Secondary | ICD-10-CM | POA: Diagnosis not present

## 2023-11-18 DIAGNOSIS — E1142 Type 2 diabetes mellitus with diabetic polyneuropathy: Secondary | ICD-10-CM | POA: Diagnosis not present

## 2023-11-18 DIAGNOSIS — M2042 Other hammer toe(s) (acquired), left foot: Secondary | ICD-10-CM | POA: Diagnosis not present

## 2023-11-18 DIAGNOSIS — B351 Tinea unguium: Secondary | ICD-10-CM | POA: Diagnosis not present

## 2023-11-22 ENCOUNTER — Other Ambulatory Visit (HOSPITAL_COMMUNITY): Payer: Self-pay

## 2023-11-22 ENCOUNTER — Other Ambulatory Visit: Payer: Self-pay | Admitting: Internal Medicine

## 2023-11-25 ENCOUNTER — Other Ambulatory Visit (HOSPITAL_COMMUNITY): Payer: Self-pay

## 2023-11-25 MED ORDER — TRESIBA FLEXTOUCH 200 UNIT/ML ~~LOC~~ SOPN
110.0000 [IU] | PEN_INJECTOR | Freq: Every day | SUBCUTANEOUS | 0 refills | Status: DC
  Filled 2023-11-25: qty 15, 27d supply, fill #0

## 2023-12-09 ENCOUNTER — Ambulatory Visit: Payer: Commercial Managed Care - PPO | Admitting: Podiatry

## 2023-12-12 ENCOUNTER — Other Ambulatory Visit (HOSPITAL_COMMUNITY): Payer: Self-pay

## 2023-12-13 ENCOUNTER — Other Ambulatory Visit (HOSPITAL_COMMUNITY): Payer: Self-pay

## 2023-12-16 ENCOUNTER — Ambulatory Visit: Admitting: Internal Medicine

## 2023-12-16 ENCOUNTER — Encounter: Payer: Self-pay | Admitting: Internal Medicine

## 2023-12-16 ENCOUNTER — Other Ambulatory Visit (HOSPITAL_COMMUNITY): Payer: Self-pay

## 2023-12-16 VITALS — BP 124/68 | HR 76 | Temp 97.8°F | Ht 66.0 in | Wt 228.0 lb

## 2023-12-16 DIAGNOSIS — Z7984 Long term (current) use of oral hypoglycemic drugs: Secondary | ICD-10-CM | POA: Diagnosis not present

## 2023-12-16 DIAGNOSIS — E1142 Type 2 diabetes mellitus with diabetic polyneuropathy: Secondary | ICD-10-CM | POA: Diagnosis not present

## 2023-12-16 DIAGNOSIS — Z1211 Encounter for screening for malignant neoplasm of colon: Secondary | ICD-10-CM | POA: Diagnosis not present

## 2023-12-16 DIAGNOSIS — E118 Type 2 diabetes mellitus with unspecified complications: Secondary | ICD-10-CM | POA: Diagnosis not present

## 2023-12-16 LAB — COMPREHENSIVE METABOLIC PANEL WITH GFR
ALT: 24 U/L (ref 0–35)
AST: 20 U/L (ref 0–37)
Albumin: 4.4 g/dL (ref 3.5–5.2)
Alkaline Phosphatase: 62 U/L (ref 39–117)
BUN: 19 mg/dL (ref 6–23)
CO2: 27 meq/L (ref 19–32)
Calcium: 9.1 mg/dL (ref 8.4–10.5)
Chloride: 101 meq/L (ref 96–112)
Creatinine, Ser: 0.65 mg/dL (ref 0.40–1.20)
GFR: 97.01 mL/min (ref >60.00)
Glucose, Bld: 134 mg/dL — ABNORMAL HIGH (ref 70–99)
Potassium: 3.7 meq/L (ref 3.5–5.1)
Sodium: 137 meq/L (ref 135–145)
Total Bilirubin: 0.6 mg/dL (ref 0.2–1.2)
Total Protein: 7.1 g/dL (ref 6.0–8.3)

## 2023-12-16 LAB — CBC
HCT: 42.2 % (ref 36.0–46.0)
Hemoglobin: 14 g/dL (ref 12.0–15.0)
MCHC: 33.1 g/dL (ref 30.0–36.0)
MCV: 93.2 fl (ref 78.0–100.0)
Platelets: 279 10*3/uL (ref 150.0–400.0)
RBC: 4.53 Mil/uL (ref 3.87–5.11)
RDW: 12.9 % (ref 11.5–15.5)
WBC: 6.8 10*3/uL (ref 4.0–10.5)

## 2023-12-16 LAB — LIPID PANEL
Cholesterol: 117 mg/dL (ref 0–200)
HDL: 41.7 mg/dL (ref >39.00)
LDL Cholesterol: 47 mg/dL (ref 0–99)
NonHDL: 74.96
Total CHOL/HDL Ratio: 3
Triglycerides: 140 mg/dL (ref 0.0–149.0)
VLDL: 28 mg/dL (ref 0.0–40.0)

## 2023-12-16 LAB — POCT GLYCOSYLATED HEMOGLOBIN (HGB A1C): Hemoglobin A1C: 7 % — AB (ref 4.0–5.6)

## 2023-12-16 LAB — MICROALBUMIN / CREATININE URINE RATIO
Creatinine,U: 23.2 mg/dL
Microalb Creat Ratio: UNDETERMINED mg/g (ref 0.0–30.0)
Microalb, Ur: <0.7 mg/dL

## 2023-12-16 MED ORDER — GABAPENTIN 300 MG PO CAPS
600.0000 mg | ORAL_CAPSULE | Freq: Four times a day (QID) | ORAL | 1 refills | Status: DC
  Filled 2023-12-16 – 2024-01-23 (×2): qty 540, 68d supply, fill #0
  Filled 2024-04-10: qty 540, 68d supply, fill #1

## 2023-12-16 MED ORDER — TRESIBA FLEXTOUCH 200 UNIT/ML ~~LOC~~ SOPN
110.0000 [IU] | PEN_INJECTOR | Freq: Every day | SUBCUTANEOUS | 11 refills | Status: DC
  Filled 2023-12-16: qty 15, 27d supply, fill #0
  Filled 2024-02-07: qty 15, 27d supply, fill #1
  Filled 2024-03-12: qty 15, 27d supply, fill #2
  Filled 2024-04-10: qty 15, 27d supply, fill #3
  Filled 2024-05-22: qty 15, 27d supply, fill #4
  Filled 2024-06-22: qty 15, 27d supply, fill #5

## 2023-12-16 NOTE — Progress Notes (Unsigned)
   Subjective:   Patient ID: Donna [name], female    DOB: 08/15/1966, 58 y.o.   MRN: [id number]  HPI The patient is a 58 YO female coming in for diabetes follow up and continuing neuropathy.   Review of Systems  Constitutional: Negative.   HENT: Negative.    Eyes: Negative.   Respiratory:  Negative for cough, chest tightness and shortness of breath.   Cardiovascular:  Negative for chest pain, palpitations and leg swelling.  Gastrointestinal:  Negative for abdominal distention, abdominal pain, constipation, diarrhea, nausea and vomiting.  Musculoskeletal: Negative.   Skin: Negative.   Neurological:  Positive for numbness.  Psychiatric/Behavioral: Negative.      Objective:  Physical Exam Constitutional:      Appearance: She is well-developed.  HENT:     Head: Normocephalic and atraumatic.  Cardiovascular:     Rate and Rhythm: Normal rate and regular rhythm.  Pulmonary:     Effort: Pulmonary effort is normal. No respiratory distress.     Breath sounds: Normal breath sounds. No wheezing or rales.  Abdominal:     General: Bowel sounds are normal. There is no distension.     Palpations: Abdomen is soft.     Tenderness: There is no abdominal tenderness. There is no rebound.  Musculoskeletal:     Cervical back: Normal range of motion.  Skin:    General: Skin is warm and dry.     Comments: Foot exam done  Neurological:     Mental Status: She is alert and oriented to person, place, and time.     Coordination: Coordination normal.     Vitals:   12/16/23 1309  BP: 124/68  Pulse: 76  Temp: 97.8 F (36.6 C)  SpO2: 97%  Weight: 228 lb (103.4 kg)  Height: 5\' 6"  (1.676 m)    Assessment & Plan:

## 2023-12-17 ENCOUNTER — Other Ambulatory Visit (HOSPITAL_COMMUNITY): Payer: Self-pay

## 2023-12-18 ENCOUNTER — Encounter: Payer: Self-pay | Admitting: Internal Medicine

## 2023-12-18 NOTE — Assessment & Plan Note (Addendum)
 POC HgA1c done and controlled which is improved from prior. She will continue tresiba  and amaryl  and metformin . Follow up 3-6 months. She does have neuropathy and taking gabapentin  for this.Checking lipid panel and UACR and CMP. Foot exam done.

## 2023-12-18 NOTE — Assessment & Plan Note (Signed)
 We did increase gabapentin  and changed rx to reflect 600 mg TID. This helps some but she is still suffering with neuropathy.

## 2023-12-20 ENCOUNTER — Other Ambulatory Visit (HOSPITAL_COMMUNITY): Payer: Self-pay

## 2023-12-27 ENCOUNTER — Telehealth: Payer: Self-pay | Admitting: Internal Medicine

## 2023-12-27 NOTE — Telephone Encounter (Signed)
 Copied from CRM [id number]. Topic: General - Other >> Dec 27, 2023  1:46 PM Caliyah H wrote: Reason for CRM: [employer] called regarding the Cologuard. She stated they received the order, but it included an incorrect  diagnosis code (Type 2 Diabetes Mellitus).For insurance purposes, the order must be submitted with the correct diagnosis code for colorectal cancer screening.  Callback Number: [phone]

## 2023-12-31 DIAGNOSIS — I70203 Unspecified atherosclerosis of native arteries of extremities, bilateral legs: Secondary | ICD-10-CM | POA: Diagnosis not present

## 2023-12-31 DIAGNOSIS — M2041 Other hammer toe(s) (acquired), right foot: Secondary | ICD-10-CM | POA: Diagnosis not present

## 2023-12-31 DIAGNOSIS — B351 Tinea unguium: Secondary | ICD-10-CM | POA: Diagnosis not present

## 2023-12-31 DIAGNOSIS — E1142 Type 2 diabetes mellitus with diabetic polyneuropathy: Secondary | ICD-10-CM | POA: Diagnosis not present

## 2023-12-31 DIAGNOSIS — M2042 Other hammer toe(s) (acquired), left foot: Secondary | ICD-10-CM | POA: Diagnosis not present

## 2023-12-31 DIAGNOSIS — Z89619 Acquired absence of unspecified leg above knee: Secondary | ICD-10-CM | POA: Diagnosis not present

## 2024-01-01 NOTE — Telephone Encounter (Signed)
 This has been corrected.

## 2024-01-01 NOTE — Addendum Note (Signed)
 Addended by: Arvell Birchwood on: 01/01/2024 11:55 AM   Modules accepted: Orders

## 2024-01-10 ENCOUNTER — Other Ambulatory Visit: Payer: Self-pay | Admitting: Internal Medicine

## 2024-01-10 ENCOUNTER — Other Ambulatory Visit (HOSPITAL_COMMUNITY): Payer: Self-pay

## 2024-01-10 MED ORDER — GLIMEPIRIDE 1 MG PO TABS
1.0000 mg | ORAL_TABLET | Freq: Every day | ORAL | 1 refills | Status: DC
  Filled 2024-01-10: qty 90, 90d supply, fill #0
  Filled 2024-04-10: qty 90, 90d supply, fill #1

## 2024-01-15 DIAGNOSIS — Z89619 Acquired absence of unspecified leg above knee: Secondary | ICD-10-CM | POA: Diagnosis not present

## 2024-01-15 DIAGNOSIS — B351 Tinea unguium: Secondary | ICD-10-CM | POA: Diagnosis not present

## 2024-01-15 DIAGNOSIS — E1142 Type 2 diabetes mellitus with diabetic polyneuropathy: Secondary | ICD-10-CM | POA: Diagnosis not present

## 2024-01-15 DIAGNOSIS — M2041 Other hammer toe(s) (acquired), right foot: Secondary | ICD-10-CM | POA: Diagnosis not present

## 2024-01-15 DIAGNOSIS — M2042 Other hammer toe(s) (acquired), left foot: Secondary | ICD-10-CM | POA: Diagnosis not present

## 2024-01-15 DIAGNOSIS — I70203 Unspecified atherosclerosis of native arteries of extremities, bilateral legs: Secondary | ICD-10-CM | POA: Diagnosis not present

## 2024-01-23 ENCOUNTER — Other Ambulatory Visit (HOSPITAL_COMMUNITY): Payer: Self-pay

## 2024-01-24 ENCOUNTER — Other Ambulatory Visit (HOSPITAL_COMMUNITY): Payer: Self-pay

## 2024-02-07 ENCOUNTER — Other Ambulatory Visit (HOSPITAL_COMMUNITY): Payer: Self-pay

## 2024-02-18 ENCOUNTER — Other Ambulatory Visit (HOSPITAL_COMMUNITY): Payer: Self-pay

## 2024-02-27 DIAGNOSIS — B351 Tinea unguium: Secondary | ICD-10-CM | POA: Diagnosis not present

## 2024-02-27 DIAGNOSIS — E1142 Type 2 diabetes mellitus with diabetic polyneuropathy: Secondary | ICD-10-CM | POA: Diagnosis not present

## 2024-02-27 DIAGNOSIS — M2042 Other hammer toe(s) (acquired), left foot: Secondary | ICD-10-CM | POA: Diagnosis not present

## 2024-02-27 DIAGNOSIS — Z89619 Acquired absence of unspecified leg above knee: Secondary | ICD-10-CM | POA: Diagnosis not present

## 2024-02-27 DIAGNOSIS — M2041 Other hammer toe(s) (acquired), right foot: Secondary | ICD-10-CM | POA: Diagnosis not present

## 2024-02-27 DIAGNOSIS — I70203 Unspecified atherosclerosis of native arteries of extremities, bilateral legs: Secondary | ICD-10-CM | POA: Diagnosis not present

## 2024-03-12 ENCOUNTER — Other Ambulatory Visit: Payer: Self-pay | Admitting: Internal Medicine

## 2024-03-12 ENCOUNTER — Other Ambulatory Visit (HOSPITAL_BASED_OUTPATIENT_CLINIC_OR_DEPARTMENT_OTHER): Payer: Self-pay

## 2024-03-13 ENCOUNTER — Other Ambulatory Visit (HOSPITAL_COMMUNITY): Payer: Self-pay

## 2024-03-13 ENCOUNTER — Other Ambulatory Visit: Payer: Self-pay

## 2024-03-13 MED ORDER — LISINOPRIL-HYDROCHLOROTHIAZIDE 20-25 MG PO TABS
1.0000 | ORAL_TABLET | Freq: Every day | ORAL | 2 refills | Status: DC
  Filled 2024-03-13: qty 90, 90d supply, fill #0
  Filled 2024-06-22: qty 90, 90d supply, fill #1

## 2024-04-10 ENCOUNTER — Other Ambulatory Visit (HOSPITAL_COMMUNITY): Payer: Self-pay

## 2024-04-10 ENCOUNTER — Other Ambulatory Visit: Payer: Self-pay | Admitting: Internal Medicine

## 2024-04-11 ENCOUNTER — Other Ambulatory Visit (HOSPITAL_COMMUNITY): Payer: Self-pay

## 2024-04-13 ENCOUNTER — Other Ambulatory Visit (HOSPITAL_COMMUNITY): Payer: Self-pay

## 2024-04-13 MED ORDER — JARDIANCE 25 MG PO TABS
25.0000 mg | ORAL_TABLET | Freq: Every day | ORAL | 0 refills | Status: DC
  Filled 2024-04-13: qty 90, 90d supply, fill #0

## 2024-05-11 ENCOUNTER — Other Ambulatory Visit (HOSPITAL_COMMUNITY): Payer: Self-pay

## 2024-05-11 ENCOUNTER — Telehealth: Admitting: Physician Assistant

## 2024-05-11 DIAGNOSIS — J208 Acute bronchitis due to other specified organisms: Secondary | ICD-10-CM

## 2024-05-11 DIAGNOSIS — B9689 Other specified bacterial agents as the cause of diseases classified elsewhere: Secondary | ICD-10-CM

## 2024-05-11 MED ORDER — BENZONATATE 100 MG PO CAPS
100.0000 mg | ORAL_CAPSULE | Freq: Three times a day (TID) | ORAL | 0 refills | Status: AC | PRN
  Filled 2024-05-11: qty 30, 10d supply, fill #0

## 2024-05-11 MED ORDER — AZITHROMYCIN 250 MG PO TABS
ORAL_TABLET | ORAL | 0 refills | Status: AC
  Filled 2024-05-11: qty 6, 5d supply, fill #0

## 2024-05-11 NOTE — Progress Notes (Signed)
 I have spent 5 minutes in review of e-visit questionnaire, review and updating patient chart, medical decision making and response to patient.   Donna Velma Lunger, PA-C

## 2024-05-11 NOTE — Progress Notes (Signed)
E-Visit for Cough  We are sorry that you are not feeling well.  Here is how we plan to help!  Based on your presentation I believe you most likely have A cough due to bacteria.  When patients have a fever and a productive cough with a change in color or increased sputum production, we are concerned about bacterial bronchitis.  If left untreated it can progress to pneumonia.  If your symptoms do not improve with your treatment plan it is important that you contact your provider.   I have prescribed Azithromyin 250 mg: two tablets now and then one tablet daily for 4 additonal days    In addition you may use A prescription cough medication called Tessalon Perles 100mg. You may take 1-2 capsules every 8 hours as needed for your cough.   From your responses in the eVisit questionnaire you describe inflammation in the upper respiratory tract which is causing a significant cough.  This is commonly called Bronchitis and has four common causes:   Allergies Viral Infections Acid Reflux Bacterial Infection Allergies, viruses and acid reflux are treated by controlling symptoms or eliminating the cause. An example might be a cough caused by taking certain blood pressure medications. You stop the cough by changing the medication. Another example might be a cough caused by acid reflux. Controlling the reflux helps control the cough.  USE OF BRONCHODILATOR ("RESCUE") INHALERS: There is a risk from using your bronchodilator too frequently.  The risk is that over-reliance on a medication which only relaxes the muscles surrounding the breathing tubes can reduce the effectiveness of medications prescribed to reduce swelling and congestion of the tubes themselves.  Although you feel brief relief from the bronchodilator inhaler, your asthma may actually be worsening with the tubes becoming more swollen and filled with mucus.  This can delay other crucial treatments, such as oral steroid medications. If you need to use a  bronchodilator inhaler daily, several times per day, you should discuss this with your provider.  There are probably better treatments that could be used to keep your asthma under control.     HOME CARE Only take medications as instructed by your medical team. Complete the entire course of an antibiotic. Drink plenty of fluids and get plenty of rest. Avoid close contacts especially the very young and the elderly Cover your mouth if you cough or cough into your sleeve. Always remember to wash your hands A steam or ultrasonic humidifier can help congestion.   GET HELP RIGHT AWAY IF: You develop worsening fever. You become short of breath You cough up blood. Your symptoms persist after you have completed your treatment plan MAKE SURE YOU  Understand these instructions. Will watch your condition. Will get help right away if you are not doing well or get worse.    Thank you for choosing an e-visit.  Your e-visit answers were reviewed by a board certified advanced clinical practitioner to complete your personal care plan. Depending upon the condition, your plan could have included both over the counter or prescription medications.  Please review your pharmacy choice. Make sure the pharmacy is open so you can pick up prescription now. If there is a problem, you may contact your provider through MyChart messaging and have the prescription routed to another pharmacy.  Your safety is important to us. If you have drug allergies check your prescription carefully.   For the next 24 hours you can use MyChart to ask questions about today's visit, request a non-urgent   call back, or ask for a work or school excuse. You will get an email in the next two days asking about your experience. I hope that your e-visit has been valuable and will speed your recovery.  

## 2024-05-22 ENCOUNTER — Other Ambulatory Visit (HOSPITAL_COMMUNITY): Payer: Self-pay

## 2024-05-26 ENCOUNTER — Other Ambulatory Visit (HOSPITAL_COMMUNITY): Payer: Self-pay

## 2024-05-26 ENCOUNTER — Other Ambulatory Visit: Payer: Self-pay | Admitting: Internal Medicine

## 2024-05-27 ENCOUNTER — Other Ambulatory Visit (HOSPITAL_COMMUNITY): Payer: Self-pay

## 2024-05-27 MED ORDER — ATORVASTATIN CALCIUM 80 MG PO TABS
80.0000 mg | ORAL_TABLET | Freq: Every day | ORAL | 0 refills | Status: DC
  Filled 2024-05-27: qty 90, 90d supply, fill #0

## 2024-05-29 DIAGNOSIS — E1142 Type 2 diabetes mellitus with diabetic polyneuropathy: Secondary | ICD-10-CM | POA: Diagnosis not present

## 2024-05-29 DIAGNOSIS — Z89619 Acquired absence of unspecified leg above knee: Secondary | ICD-10-CM | POA: Diagnosis not present

## 2024-05-29 DIAGNOSIS — L84 Corns and callosities: Secondary | ICD-10-CM | POA: Diagnosis not present

## 2024-05-29 DIAGNOSIS — I70203 Unspecified atherosclerosis of native arteries of extremities, bilateral legs: Secondary | ICD-10-CM | POA: Diagnosis not present

## 2024-05-29 DIAGNOSIS — M2042 Other hammer toe(s) (acquired), left foot: Secondary | ICD-10-CM | POA: Diagnosis not present

## 2024-05-29 DIAGNOSIS — B351 Tinea unguium: Secondary | ICD-10-CM | POA: Diagnosis not present

## 2024-05-29 DIAGNOSIS — M2041 Other hammer toe(s) (acquired), right foot: Secondary | ICD-10-CM | POA: Diagnosis not present

## 2024-06-01 ENCOUNTER — Other Ambulatory Visit (HOSPITAL_COMMUNITY): Payer: Self-pay

## 2024-06-01 ENCOUNTER — Other Ambulatory Visit: Payer: Self-pay | Admitting: Internal Medicine

## 2024-06-02 ENCOUNTER — Other Ambulatory Visit (HOSPITAL_COMMUNITY): Payer: Self-pay

## 2024-06-02 MED ORDER — SERTRALINE HCL 100 MG PO TABS
100.0000 mg | ORAL_TABLET | Freq: Every day | ORAL | 0 refills | Status: DC
  Filled 2024-06-02: qty 90, 90d supply, fill #0

## 2024-06-10 ENCOUNTER — Other Ambulatory Visit (HOSPITAL_COMMUNITY): Payer: Self-pay

## 2024-06-11 ENCOUNTER — Other Ambulatory Visit (HOSPITAL_COMMUNITY): Payer: Self-pay

## 2024-06-17 ENCOUNTER — Other Ambulatory Visit (HOSPITAL_COMMUNITY): Payer: Self-pay

## 2024-06-22 ENCOUNTER — Other Ambulatory Visit (HOSPITAL_COMMUNITY): Payer: Self-pay

## 2024-06-22 ENCOUNTER — Other Ambulatory Visit: Payer: Self-pay | Admitting: Internal Medicine

## 2024-06-23 ENCOUNTER — Other Ambulatory Visit (HOSPITAL_COMMUNITY): Payer: Self-pay

## 2024-06-23 MED ORDER — METFORMIN HCL 1000 MG PO TABS
ORAL_TABLET | Freq: Two times a day (BID) | ORAL | 3 refills | Status: DC
  Filled 2024-06-23: qty 180, 90d supply, fill #0

## 2024-06-24 ENCOUNTER — Other Ambulatory Visit: Payer: Self-pay | Admitting: Internal Medicine

## 2024-06-24 DIAGNOSIS — Z1231 Encounter for screening mammogram for malignant neoplasm of breast: Secondary | ICD-10-CM

## 2024-07-03 ENCOUNTER — Ambulatory Visit
Admission: RE | Admit: 2024-07-03 | Discharge: 2024-07-03 | Disposition: A | Discharge status: Home / Self Care | Source: Ambulatory Visit

## 2024-07-03 DIAGNOSIS — Z1231 Encounter for screening mammogram for malignant neoplasm of breast: Secondary | ICD-10-CM | POA: Diagnosis not present

## 2024-07-07 ENCOUNTER — Other Ambulatory Visit (HOSPITAL_COMMUNITY): Payer: Self-pay

## 2024-07-07 ENCOUNTER — Ambulatory Visit: Admitting: Internal Medicine

## 2024-07-07 VITALS — BP 116/70 | HR 82 | Temp 98.3°F | Ht 66.0 in | Wt 227.0 lb

## 2024-07-07 DIAGNOSIS — E785 Hyperlipidemia, unspecified: Secondary | ICD-10-CM

## 2024-07-07 DIAGNOSIS — F3342 Major depressive disorder, recurrent, in full remission: Secondary | ICD-10-CM | POA: Diagnosis not present

## 2024-07-07 DIAGNOSIS — E118 Type 2 diabetes mellitus with unspecified complications: Secondary | ICD-10-CM | POA: Diagnosis not present

## 2024-07-07 DIAGNOSIS — E1169 Type 2 diabetes mellitus with other specified complication: Secondary | ICD-10-CM | POA: Diagnosis not present

## 2024-07-07 DIAGNOSIS — E1142 Type 2 diabetes mellitus with diabetic polyneuropathy: Secondary | ICD-10-CM | POA: Diagnosis not present

## 2024-07-07 DIAGNOSIS — I10 Essential (primary) hypertension: Secondary | ICD-10-CM

## 2024-07-07 DIAGNOSIS — Z0000 Encounter for general adult medical examination without abnormal findings: Secondary | ICD-10-CM

## 2024-07-07 LAB — COMPREHENSIVE METABOLIC PANEL WITH GFR
ALT: 18 U/L (ref 0–35)
AST: 14 U/L (ref 0–37)
Albumin: 4.4 g/dL (ref 3.5–5.2)
Alkaline Phosphatase: 83 U/L (ref 39–117)
BUN: 18 mg/dL (ref 6–23)
CO2: 27 meq/L (ref 19–32)
Calcium: 8.9 mg/dL (ref 8.4–10.5)
Chloride: 101 meq/L (ref 96–112)
Creatinine, Ser: 0.65 mg/dL (ref 0.40–1.20)
GFR: 96.63 mL/min (ref >60.00)
Glucose, Bld: 148 mg/dL — ABNORMAL HIGH (ref 70–99)
Potassium: 3.5 meq/L (ref 3.5–5.1)
Sodium: 138 meq/L (ref 135–145)
Total Bilirubin: 0.5 mg/dL (ref 0.2–1.2)
Total Protein: 7.3 g/dL (ref 6.0–8.3)

## 2024-07-07 MED ORDER — ATORVASTATIN CALCIUM 80 MG PO TABS
80.0000 mg | ORAL_TABLET | Freq: Every day | ORAL | 3 refills | Status: AC
  Filled 2024-07-07 – 2024-09-06 (×2): qty 90, 90d supply, fill #0

## 2024-07-07 MED ORDER — GLIMEPIRIDE 1 MG PO TABS
1.0000 mg | ORAL_TABLET | Freq: Every day | ORAL | 3 refills | Status: AC
Start: 2024-07-07 — End: ?
  Filled 2024-07-07 – 2024-07-15 (×3): qty 90, 90d supply, fill #0

## 2024-07-07 MED ORDER — TRESIBA FLEXTOUCH 200 UNIT/ML ~~LOC~~ SOPN
110.0000 [IU] | PEN_INJECTOR | Freq: Every day | SUBCUTANEOUS | 3 refills | Status: AC
  Filled 2024-07-07: qty 48, 87d supply, fill #0
  Filled 2024-07-15 (×2): qty 15, 27d supply, fill #0
  Filled 2024-09-06: qty 15, 27d supply, fill #1

## 2024-07-07 MED ORDER — JARDIANCE 25 MG PO TABS
25.0000 mg | ORAL_TABLET | Freq: Every day | ORAL | 3 refills | Status: AC
  Filled 2024-07-07 – 2024-07-15 (×2): qty 90, 90d supply, fill #0

## 2024-07-07 MED ORDER — SERTRALINE HCL 100 MG PO TABS
100.0000 mg | ORAL_TABLET | Freq: Every day | ORAL | 3 refills | Status: AC
  Filled 2024-07-07 – 2024-09-06 (×2): qty 90, 90d supply, fill #0

## 2024-07-07 MED ORDER — METFORMIN HCL 1000 MG PO TABS
ORAL_TABLET | Freq: Two times a day (BID) | ORAL | 3 refills | Status: AC
  Filled 2024-07-07: qty 180, fill #0

## 2024-07-07 MED ORDER — LISINOPRIL-HYDROCHLOROTHIAZIDE 20-25 MG PO TABS
1.0000 | ORAL_TABLET | Freq: Every day | ORAL | 3 refills | Status: AC
  Filled 2024-07-07: qty 90, 90d supply, fill #0

## 2024-07-07 MED ORDER — GABAPENTIN 300 MG PO CAPS
600.0000 mg | ORAL_CAPSULE | Freq: Four times a day (QID) | ORAL | 3 refills | Status: AC
  Filled 2024-07-07 – 2024-07-15 (×3): qty 540, 68d supply, fill #0

## 2024-07-07 NOTE — Progress Notes (Signed)
   Subjective:   Patient ID: Donna [name], female    DOB: 1965-12-11, 59 y.o.   MRN: [id number]  The patient is here for physical. Pertinent topics discussed: Discussed the use of AI scribe software for clinical note transcription with the patient, who gave verbal consent to proceed.  History of Present Illness Donna [name] is a 58 year old female with diabetes and neuropathy who presents for follow-up regarding neuropathy and diabetes management.  She experiences ongoing neuropathy, primarily affecting her feet and occasionally her hands, with symptoms described as stable and in a 'stocking and glove distribution'. There are no significant changes or spreading, and she has not noticed any new wounds or spots on her feet, which she checks regularly. She continues to see a dietitian every three months and wears diabetic shoes with inserts for additional support.  PMH, [facility], social history reviewed and updated  Review of Systems  Constitutional: Negative.   HENT: Negative.    Eyes: Negative.   Respiratory:  Negative for cough, chest tightness and shortness of breath.   Cardiovascular:  Negative for chest pain, palpitations and leg swelling.  Gastrointestinal:  Negative for abdominal distention, abdominal pain, constipation, diarrhea, nausea and vomiting.  Musculoskeletal: Negative.   Skin: Negative.   Neurological:  Positive for numbness.  Psychiatric/Behavioral: Negative.      Objective:  Physical Exam Constitutional:      Appearance: She is well-developed.  HENT:     Head: Normocephalic and atraumatic.  Cardiovascular:     Rate and Rhythm: Normal rate and regular rhythm.  Pulmonary:     Effort: Pulmonary effort is normal. No respiratory distress.     Breath sounds: Normal breath sounds. No wheezing or rales.  Abdominal:     General: Bowel sounds are normal. There is no distension.     Palpations: Abdomen is soft.     Tenderness: There is no abdominal tenderness.   Musculoskeletal:     Cervical back: Normal range of motion.  Skin:    General: Skin is warm and dry.  Neurological:     Mental Status: She is alert and oriented to person, place, and time.     Coordination: Coordination normal.     Vitals:   07/07/24 1536  BP: 116/70  Pulse: 82  Temp: 98.3 F (36.8 C)  TempSrc: Oral  SpO2: 97%  Weight: 227 lb (103 kg)  Height: 5' 6 (1.676 m)    Assessment & Plan:

## 2024-07-08 ENCOUNTER — Ambulatory Visit: Payer: Self-pay | Admitting: Internal Medicine

## 2024-07-08 ENCOUNTER — Other Ambulatory Visit (HOSPITAL_COMMUNITY): Payer: Self-pay

## 2024-07-08 ENCOUNTER — Other Ambulatory Visit: Payer: Self-pay

## 2024-07-08 LAB — CBC
HCT: 43.1 % (ref 36.0–46.0)
Hemoglobin: 14.7 g/dL (ref 12.0–15.0)
MCHC: 34.1 g/dL (ref 30.0–36.0)
MCV: 91.9 fl (ref 78.0–100.0)
Platelets: 294 10*3/uL (ref 150.0–400.0)
RBC: 4.7 Mil/uL (ref 3.87–5.11)
RDW: 13.2 % (ref 11.5–15.5)
WBC: 7.9 10*3/uL (ref 4.0–10.5)

## 2024-07-08 LAB — HEMOGLOBIN A1C: Hgb A1c MFr Bld: 7.7 % — ABNORMAL HIGH (ref 4.6–6.5)

## 2024-07-10 ENCOUNTER — Ambulatory Visit: Payer: Self-pay | Admitting: Internal Medicine

## 2024-07-10 ENCOUNTER — Encounter: Payer: Self-pay | Admitting: Internal Medicine

## 2024-07-10 NOTE — Assessment & Plan Note (Signed)
Overall stable.   

## 2024-07-10 NOTE — Assessment & Plan Note (Signed)
 Lipid panel up to date continue meds.

## 2024-07-10 NOTE — Assessment & Plan Note (Signed)
 Checking HGA1c and CMP. Adjust as needed. Prior amputations which are associated.

## 2024-07-10 NOTE — Assessment & Plan Note (Addendum)
 Stable with zoloft  daily and continue. Recurrent and in remission currently.

## 2024-07-10 NOTE — Assessment & Plan Note (Signed)
 BMI 36 and complicated by diabetes. Counseled about diet and exercise.

## 2024-07-10 NOTE — Assessment & Plan Note (Signed)
 BP at goal checking CMP and adjust as needed.

## 2024-07-10 NOTE — Assessment & Plan Note (Signed)
 Flu shot up to date. Pneumonia counseled. Shingrix counseled. Tetanus counseled. Cologuard sitting at home encouraged to send back. Mammogram counseled, pap smear counseled. Counseled about sun safety and mole surveillance. Counseled about the dangers of distracted driving. Given 10 year screening recommendations.

## 2024-07-15 ENCOUNTER — Other Ambulatory Visit (HOSPITAL_COMMUNITY): Payer: Self-pay

## 2024-07-15 ENCOUNTER — Other Ambulatory Visit: Payer: Self-pay

## 2024-07-17 ENCOUNTER — Other Ambulatory Visit: Payer: Self-pay

## 2024-07-20 ENCOUNTER — Other Ambulatory Visit (HOSPITAL_COMMUNITY): Payer: Self-pay

## 2024-08-05 DIAGNOSIS — H524 Presbyopia: Secondary | ICD-10-CM | POA: Diagnosis not present

## 2024-09-06 ENCOUNTER — Other Ambulatory Visit (HOSPITAL_COMMUNITY): Payer: Self-pay

## 2024-09-07 ENCOUNTER — Other Ambulatory Visit: Payer: Self-pay

## 2024-09-10 ENCOUNTER — Other Ambulatory Visit (HOSPITAL_COMMUNITY): Payer: Self-pay
